# Patient Record
Sex: Female | Born: 1960
Health system: Southern US, Community
[De-identification: ages and names within clinical notes are randomized; demographics above are authoritative.]

## PROBLEM LIST (undated history)

## (undated) DIAGNOSIS — M1990 Unspecified osteoarthritis, unspecified site: Secondary | ICD-10-CM

## (undated) DIAGNOSIS — T50905A Adverse effect of unspecified drugs, medicaments and biological substances, initial encounter: Secondary | ICD-10-CM

## (undated) DIAGNOSIS — R748 Abnormal levels of other serum enzymes: Secondary | ICD-10-CM

## (undated) DIAGNOSIS — F32A Depression, unspecified: Secondary | ICD-10-CM

## (undated) DIAGNOSIS — R21 Rash and other nonspecific skin eruption: Secondary | ICD-10-CM

## (undated) DIAGNOSIS — R634 Abnormal weight loss: Secondary | ICD-10-CM

## (undated) DIAGNOSIS — T7840XA Allergy, unspecified, initial encounter: Secondary | ICD-10-CM

## (undated) DIAGNOSIS — F329 Major depressive disorder, single episode, unspecified: Secondary | ICD-10-CM

## (undated) DIAGNOSIS — R079 Chest pain, unspecified: Secondary | ICD-10-CM

## (undated) DIAGNOSIS — G8929 Other chronic pain: Secondary | ICD-10-CM

## (undated) DIAGNOSIS — E7800 Pure hypercholesterolemia, unspecified: Secondary | ICD-10-CM

## (undated) DIAGNOSIS — M797 Fibromyalgia: Secondary | ICD-10-CM

## (undated) DIAGNOSIS — F419 Anxiety disorder, unspecified: Secondary | ICD-10-CM

## (undated) HISTORY — DX: Chest pain, unspecified: R07.9

## (undated) HISTORY — DX: Depression, unspecified: F32.A

## (undated) HISTORY — DX: Rash and other nonspecific skin eruption: R21

## (undated) HISTORY — PX: CHOLECYSTECTOMY: SHX55

## (undated) HISTORY — PX: NASAL SINUS SURGERY: SHX719

## (undated) HISTORY — DX: Abnormal weight loss: R63.4

## (undated) HISTORY — PX: TONSILLECTOMY: SUR1361

## (undated) HISTORY — DX: Adverse effect of unspecified drugs, medicaments and biological substances, initial encounter: T50.905A

## (undated) HISTORY — PX: COLONOSCOPY: SHX174

## (undated) HISTORY — DX: Fibromyalgia: M79.7

## (undated) HISTORY — DX: Allergy, unspecified, initial encounter: T78.40XA

## (undated) HISTORY — DX: Major depressive disorder, single episode, unspecified: F32.9

## (undated) HISTORY — DX: Abnormal levels of other serum enzymes: R74.8

## (undated) HISTORY — PX: TUBAL LIGATION: SHX77

## (undated) HISTORY — PX: ANKLE SURGERY: SHX546

---

## 1995-11-07 HISTORY — PX: BREAST BIOPSY: SHX20

## 2010-09-24 ENCOUNTER — Emergency Department: Payer: Self-pay | Admitting: Emergency Medicine

## 2012-12-31 ENCOUNTER — Ambulatory Visit: Payer: Self-pay | Admitting: Family Medicine

## 2013-09-05 ENCOUNTER — Ambulatory Visit: Payer: Self-pay | Admitting: Physical Medicine and Rehabilitation

## 2014-10-20 ENCOUNTER — Ambulatory Visit: Payer: Self-pay | Admitting: Nurse Practitioner

## 2015-07-11 DIAGNOSIS — F32A Depression, unspecified: Secondary | ICD-10-CM | POA: Insufficient documentation

## 2015-07-11 DIAGNOSIS — E782 Mixed hyperlipidemia: Secondary | ICD-10-CM | POA: Insufficient documentation

## 2015-07-11 DIAGNOSIS — F329 Major depressive disorder, single episode, unspecified: Secondary | ICD-10-CM | POA: Insufficient documentation

## 2015-07-11 DIAGNOSIS — F419 Anxiety disorder, unspecified: Secondary | ICD-10-CM | POA: Insufficient documentation

## 2015-07-11 DIAGNOSIS — G894 Chronic pain syndrome: Secondary | ICD-10-CM | POA: Insufficient documentation

## 2015-12-20 ENCOUNTER — Emergency Department
Admission: EM | Admit: 2015-12-20 | Discharge: 2015-12-20 | Disposition: A | Discharge status: Home / Self Care | Payer: BLUE CROSS/BLUE SHIELD | Attending: Emergency Medicine | Admitting: Emergency Medicine

## 2015-12-20 ENCOUNTER — Emergency Department: Payer: BLUE CROSS/BLUE SHIELD

## 2015-12-20 ENCOUNTER — Encounter: Payer: Self-pay | Admitting: Emergency Medicine

## 2015-12-20 DIAGNOSIS — Z87891 Personal history of nicotine dependence: Secondary | ICD-10-CM | POA: Diagnosis not present

## 2015-12-20 DIAGNOSIS — R1084 Generalized abdominal pain: Secondary | ICD-10-CM | POA: Insufficient documentation

## 2015-12-20 HISTORY — DX: Pure hypercholesterolemia, unspecified: E78.00

## 2015-12-20 LAB — BASIC METABOLIC PANEL
Anion gap: 5 (ref 5–15)
BUN: 16 mg/dL (ref 6–20)
CHLORIDE: 111 mmol/L (ref 101–111)
CO2: 25 mmol/L (ref 22–32)
Calcium: 9.1 mg/dL (ref 8.9–10.3)
Creatinine, Ser: 0.77 mg/dL (ref 0.44–1.00)
GFR calc non Af Amer: >60 mL/min (ref >60)
Glucose, Bld: 99 mg/dL (ref 65–99)
POTASSIUM: 4.2 mmol/L (ref 3.5–5.1)
SODIUM: 141 mmol/L (ref 135–145)

## 2015-12-20 LAB — URINALYSIS COMPLETE WITH MICROSCOPIC (ARMC ONLY)
Bilirubin Urine: NEGATIVE
Glucose, UA: NEGATIVE mg/dL
HGB URINE DIPSTICK: NEGATIVE
KETONES UR: NEGATIVE mg/dL
LEUKOCYTES UA: NEGATIVE
NITRITE: NEGATIVE
PH: 5 (ref 5.0–8.0)
PROTEIN: NEGATIVE mg/dL
Specific Gravity, Urine: 1.035 — ABNORMAL HIGH (ref 1.005–1.030)

## 2015-12-20 LAB — CBC
HEMATOCRIT: 40.9 % (ref 35.0–47.0)
Hemoglobin: 14 g/dL (ref 12.0–16.0)
MCH: 30.1 pg (ref 26.0–34.0)
MCHC: 34.4 g/dL (ref 32.0–36.0)
MCV: 87.6 fL (ref 80.0–100.0)
Platelets: 249 10*3/uL (ref 150–440)
RBC: 4.67 MIL/uL (ref 3.80–5.20)
RDW: 13.4 % (ref 11.5–14.5)
WBC: 6.1 10*3/uL (ref 3.6–11.0)

## 2015-12-20 LAB — TROPONIN I: Troponin I: <0.03 ng/mL (ref <0.031)

## 2015-12-20 LAB — LIPASE, BLOOD: Lipase: 24 U/L (ref 11–51)

## 2015-12-20 MED ORDER — DICYCLOMINE HCL 20 MG PO TABS: 20.0000 mg | ORAL_TABLET | Freq: Three times a day (TID) | ORAL | Status: DC | PRN

## 2015-12-20 MED ORDER — ONDANSETRON HCL 4 MG/2ML IJ SOLN
4.0000 mg | Freq: Once | INTRAMUSCULAR | Status: AC
  Administered 2015-12-20: 4 mg via INTRAVENOUS
  Filled 2015-12-20: qty 2

## 2015-12-20 MED ORDER — METOCLOPRAMIDE HCL 10 MG PO TABS: 10.0000 mg | ORAL_TABLET | Freq: Four times a day (QID) | ORAL | Status: DC | PRN

## 2015-12-20 MED ORDER — SODIUM CHLORIDE 0.9 % IV BOLUS (SEPSIS)
1000.0000 mL | Freq: Once | INTRAVENOUS | Status: AC
  Administered 2015-12-20: 1000 mL via INTRAVENOUS

## 2015-12-20 MED ORDER — IOHEXOL 240 MG/ML SOLN
25.0000 mL | Freq: Once | INTRAMUSCULAR | Status: AC | PRN
  Administered 2015-12-20: 25 mL via ORAL
  Filled 2015-12-20: qty 25

## 2015-12-20 MED ORDER — IOHEXOL 300 MG/ML  SOLN
100.0000 mL | Freq: Once | INTRAMUSCULAR | Status: AC | PRN
  Administered 2015-12-20: 100 mL via INTRAVENOUS
  Filled 2015-12-20: qty 100

## 2015-12-20 MED ORDER — MORPHINE SULFATE (PF) 4 MG/ML IV SOLN
4.0000 mg | Freq: Once | INTRAVENOUS | Status: AC
  Administered 2015-12-20: 4 mg via INTRAVENOUS
  Filled 2015-12-20: qty 1

## 2015-12-20 NOTE — ED Notes (Signed)
Patient presents to the ED with sharp intermittent left sided chest pain since a week ago Saturday.  Patient also reports bloating and abdominal pain that began around the same time.  Patient states, "I feel like I did before I had to get my galbladder out".  Patient was told she had a UTI when she was seen at Urgent Care last Tuesday.  Patient was instructed to come to the ED that day but patient reports having other responsibilities to take care of prior to coming to the ED.  Patient ambulatory to triage.  Denies nausea, vomiting, and diarrhea.  Patient is in no obvious distress at this time.

## 2015-12-20 NOTE — ED Notes (Signed)
AAOx3.  Skin warm and dry.  Posture relaxed.  Gait steady.  NAD.

## 2015-12-20 NOTE — ED Provider Notes (Signed)
[facility] Emergency Department Provider Note  ____________________________________________  Time seen: Approximately 120pM  I have reviewed the triage vital signs and the nursing notes.   HISTORY  Chief Complaint Abdominal Pain and Chest Pain    HPI Nicole Walter is a 55 y.o. female with a history of high cholesterol who is presenting today with diffuse abdominal pain radiating into her left chest. She says the pain is been ongoing for about a week and a half. She was seen in urgent care about a week and a half ago and was diagnosed with abdominal pain with a urinary tract infection. She's been taking Cipro and no longer has any urinary symptoms. She says that she is also been taking MiraLAX because she was having thin, ribbonlike stool. She denies any blood in her stool. She denies any nausea or vomiting. She says the pain is aching and to her left epigastrium right and right upper and right lower quadrants.She says that she also has abdominal distention after eating. Says that since taking the MiraLAX she has had liquid like stools. Requesting pain medication at this time. Denies any worsening with eating. No shortness of breath. Denies worsening with exertion. Abdominal pain has been radiating to the back.   Past Medical History  Diagnosis Date  . High cholesterol     There are no active problems to display for this patient.   Past Surgical History  Procedure Laterality Date  . Cholecystectomy    . Tubal ligation    . Ankle surgery      x 2  . Nasal sinus surgery      No current outpatient prescriptions on file.  Allergies Review of patient's allergies indicates no known allergies.  No family history on file.  Social History Social History  Substance Use Topics  . Smoking status: Former Smoker    Quit date: 07/08/2015  . Smokeless tobacco: None  . Alcohol Use: No    Review of Systems Constitutional: No fever/chills Eyes: No visual  changes. ENT: No sore throat. Cardiovascular: Abdominal pain radiates up in the left side of the chest Respiratory: Denies shortness of breath. Gastrointestinal:   No nausea, no vomiting.  Genitourinary: Negative for dysuria. Musculoskeletal: As above Skin: Negative for rash. Neurological: Negative for headaches, focal weakness or numbness.  10-point ROS otherwise negative.  ____________________________________________   PHYSICAL EXAM:  VITAL SIGNS: ED Triage Vitals  Enc Vitals Group     BP 12/20/15 0910 130/68 mmHg     Pulse Rate 12/20/15 0910 79     Resp 12/20/15 0910 16     Temp 12/20/15 0910 97.7 F (36.5 C)     Temp Source 12/20/15 0910 Oral     SpO2 12/20/15 0910 97 %     Weight 12/20/15 0910 165 lb (74.844 kg)     Height 12/20/15 0910 5\' 4"  (1.626 m)     Head Cir --      Peak Flow --      Pain Score 12/20/15 0911 5     Pain Loc --      Pain Edu? --      Excl. in [place]? --     Constitutional: Alert and oriented. Well appearing and in no acute distress. Eyes: Conjunctivae are normal. PERRL. EOMI. Head: Atraumatic. Nose: No congestion/rhinnorhea. Mouth/Throat: Mucous membranes are moist.  Oropharynx non-erythematous. Neck: No stridor.   Cardiovascular: Normal rate, regular rhythm. Grossly normal heart sounds.  Good peripheral circulation. Respiratory: Normal respiratory effort.  No  retractions. Lungs CTAB. Gastrointestinal: Soft with diffuse tenderness which is worse in the right lower quadrant. No rebound or guarding. No distention. No CVA tenderness. Musculoskeletal: No lower extremity tenderness nor edema.  No joint effusions. Neurologic:  Normal speech and language. No gross focal neurologic deficits are appreciated. No gait instability. Skin:  Skin is warm, dry and intact. No rash noted. Psychiatric: Mood and affect are normal. Speech and behavior are normal.  ____________________________________________   LABS (all labs ordered are listed, but only  abnormal results are displayed)  Labs Reviewed  URINALYSIS COMPLETEWITH MICROSCOPIC ([place]) - Abnormal; Notable for the following:    Color, Urine YELLOW (*)    APPearance HAZY (*)    Specific Gravity, Urine 1.035 (*)    Bacteria, UA RARE (*)    Squamous Epithelial / LPF 0-5 (*)    All other components within normal limits  BASIC METABOLIC PANEL  CBC  TROPONIN I  LIPASE, BLOOD   ____________________________________________  EKG  ED ECG REPORT I, Doran Stabler, the attending physician, personally viewed and interpreted this ECG.   Date: 12/20/2015  EKG Time: 906  Rate: 75  Rhythm: normal sinus rhythm  Axis: Normal  Intervals:none  ST&T Change: No ST segment elevation or depression. No abnormal T-wave inversion.  ____________________________________________  RADIOLOGY  Chest x-ray without any acute cardio pulmonary process.  IMPRESSION: No acute finding abdomen or pelvis. No abnormality to explain the patient's symptoms.  Fatty infiltration of the liver. ____________________________________________   PROCEDURES    ____________________________________________   INITIAL IMPRESSION / ASSESSMENT AND PLAN / ED COURSE  Pertinent labs & imaging results that were available during my care of the patient were reviewed by me and considered in my medical decision making (see chart for details).  ----------------------------------------- 2:50 PM on 12/20/2015 -----------------------------------------  Patient resting comfortably and tolerated the by mouth contrast. Very reassuring CAT scan for ruling out acute cause of her pain. I reviewed the results with her. Unclear etiology for her symptoms. Will start her with Reglan as a promotility agent. I told her to use it when she is feeling bloated. He has scheduled an appointment with her primary care doctor on March 10. We'll also give her the phone number to follow-up with gastroenterology. Understands return  precautions. ____________________________________________   FINAL CLINICAL IMPRESSION(S) / ED DIAGNOSES  Abdominal pain.    Orbie Pyo, MD 12/20/15 1452

## 2016-01-06 ENCOUNTER — Ambulatory Visit (INDEPENDENT_AMBULATORY_CARE_PROVIDER_SITE_OTHER): Payer: BLUE CROSS/BLUE SHIELD | Admitting: Nurse Practitioner

## 2016-01-06 ENCOUNTER — Encounter: Payer: Self-pay | Admitting: Nurse Practitioner

## 2016-01-06 VITALS — BP 108/62 | HR 76 | Temp 98.1°F | Resp 16 | Ht 64.0 in | Wt 167.8 lb

## 2016-01-06 DIAGNOSIS — G894 Chronic pain syndrome: Secondary | ICD-10-CM

## 2016-01-06 DIAGNOSIS — R1084 Generalized abdominal pain: Secondary | ICD-10-CM

## 2016-01-06 DIAGNOSIS — Z7689 Persons encountering health services in other specified circumstances: Secondary | ICD-10-CM

## 2016-01-06 DIAGNOSIS — M47896 Other spondylosis, lumbar region: Secondary | ICD-10-CM

## 2016-01-06 DIAGNOSIS — Z7189 Other specified counseling: Secondary | ICD-10-CM

## 2016-01-06 DIAGNOSIS — F32A Depression, unspecified: Secondary | ICD-10-CM

## 2016-01-06 DIAGNOSIS — E782 Mixed hyperlipidemia: Secondary | ICD-10-CM | POA: Diagnosis not present

## 2016-01-06 DIAGNOSIS — F419 Anxiety disorder, unspecified: Secondary | ICD-10-CM

## 2016-01-06 DIAGNOSIS — F418 Other specified anxiety disorders: Secondary | ICD-10-CM

## 2016-01-06 DIAGNOSIS — R10817 Generalized abdominal tenderness: Secondary | ICD-10-CM

## 2016-01-06 DIAGNOSIS — M47816 Spondylosis without myelopathy or radiculopathy, lumbar region: Secondary | ICD-10-CM | POA: Insufficient documentation

## 2016-01-06 DIAGNOSIS — F329 Major depressive disorder, single episode, unspecified: Secondary | ICD-10-CM

## 2016-01-06 NOTE — Patient Instructions (Signed)
We will call you with your referral.   Welcome to [facility]! Nice to meet you.

## 2016-01-06 NOTE — Progress Notes (Signed)
Patient ID: Nicole Walter, female    DOB: 1961-05-19  Age: 55 y.o. MRN: [id number]  CC: Establish Care and Diverticulosis   HPI Nicole Walter presents for establishing care and CC of needing a referral   1) New Pt Info:   Immunizations- UTD  Mammogram- 10/2015 [facility]   Pap- 10/2015 [facility]   Colonoscopy- 2013, reportedly good for 10 years, had in [place]   2) Chronic Problems-  Chronic pain- was seen at [facility] in [place], [place] and a Doctor for pain, but has since stopped going.    Recent Annual exam on 11/02/16 with Dr. Bobette Mo   PHQ-9 = 16  Health Maintenance COPIED FROM CARE EVERYWHERE Topic Date Due  . Mammogram 10/12/2016  . Pap Smear 10/13/2016  . Colonoscopy 11/06/2021  . Adult Tetanus (Td And Tdap) 11/06/2021  . Influenza Vaccine Completed  . HIV Screen Completed  . Hepatitis C Screen Completed    Diet recall last 24 hrs   Chex for breakfast  Lunchable for lunch  Captain D's- hush puppies , fried fish and slaw, tea, water and cokes   Black tea and chai this morning   3) Acute Problems-  pna 23 09/16/13 [place]  Flu 07/23/15  Shingles 10/22/12   Pt was seen in the ED on 12/20/15 for generalized abdominal pain. EKG was normal. CXR shows no significant findings. CT abdomen pelvis-  fatty liver without significant findings. Gave her Reglan and a phone number to follow up with GI outpatient.    History Nicole Walter has a past medical history of High cholesterol and Depression.   She has past surgical history that includes Cholecystectomy; Tubal ligation; Ankle surgery; and Nasal sinus surgery.   Her family history includes Arthritis in her father, paternal grandfather, and paternal grandmother; Cancer in her father; Heart disease in her mother; Stroke in her mother.She reports that she quit smoking about 6 months ago. She does not have any smokeless tobacco history on file. She reports that she does not drink alcohol. Her drug history  is not on file.  Outpatient Prescriptions Prior to Visit  Medication Sig Dispense Refill  . metoCLOPramide (REGLAN) 10 MG tablet Take 1 tablet (10 mg total) by mouth every 6 (six) hours as needed (bloating, nausea or vomiting). 12 tablet 0  . dicyclomine (BENTYL) 20 MG tablet Take 1 tablet (20 mg total) by mouth 3 (three) times daily as needed for spasms. 20 tablet 0   No facility-administered medications prior to visit.    ROS Review of Systems  Constitutional: Negative for fever, chills, diaphoresis, activity change, appetite change, fatigue and unexpected weight change.  Eyes: Negative for visual disturbance.  Respiratory: Negative for chest tightness and shortness of breath.   Cardiovascular: Negative for chest pain.  Gastrointestinal: Positive for abdominal pain. Negative for nausea, vomiting and diarrhea.  Neurological: Positive for headaches.  Psychiatric/Behavioral: Positive for sleep disturbance. Negative for suicidal ideas. The patient is nervous/anxious.     Objective:  BP 108/62 mmHg  Pulse 76  Temp(Src) 98.1 F (36.7 C) (Oral)  Resp 16  Ht 5\' 4"  (1.626 m)  Wt 167 lb 12.8 oz (76.114 kg)  BMI 28.79 kg/m2  SpO2 96%  Physical Exam  Constitutional: She is oriented to person, place, and time. She appears well-developed and well-nourished. No distress.  HENT:  Head: Normocephalic and atraumatic.  Right Ear: External ear normal.  Left Ear: External ear normal.  Eyes: EOM are normal. Pupils are equal, round, and  reactive to light. Right eye exhibits no discharge. Left eye exhibits no discharge. No scleral icterus.  Cardiovascular: Normal rate, regular rhythm and normal heart sounds.  Exam reveals no gallop and no friction rub.   No murmur heard. Pulmonary/Chest: Effort normal and breath sounds normal. No respiratory distress. She has no wheezes. She has no rales. She exhibits no tenderness.  Abdominal: Soft. Bowel sounds are normal. She exhibits no distension and no  mass. There is tenderness. There is no rebound and no guarding.  Generalized  Neurological: She is alert and oriented to person, place, and time. No cranial nerve deficit. She exhibits normal muscle tone. Coordination normal.  Skin: Skin is warm and dry. No rash noted. She is not diaphoretic.  Psychiatric: Judgment and thought content normal.  Very scattered. Came late to appointment, has many papers shuffled during visit, seems anxious   Assessment & Plan:   Nicole Walter was seen today for establish care and diverticulosis.  Diagnoses and all orders for this visit:  Generalized abdominal pain -     Ambulatory referral to Gastroenterology  Combined fat and carbohydrate induced hyperlipemia  Chronic pain associated with significant psychosocial dysfunction  Anxiety and depression  Other osteoarthritis of spine, lumbar region  Encounter to establish care   I am having Nicole Walter maintain her metoCLOPramide, MULTI-VITAMINS, LORazepam, gabapentin, dicyclomine, cyclobenzaprine, Vitamin D (Ergocalciferol), Vitamin B 12, diphenhydrAMINE, DULoxetine, fluticasone, and simvastatin.  Meds ordered this encounter  Medications  . Multiple Vitamin (MULTI-VITAMINS) TABS    Sig: Take by mouth.  Marland Kitchen LORazepam (ATIVAN) 1 MG tablet    Sig: Take by mouth.  . gabapentin (NEURONTIN) 600 MG tablet    Sig: Take by mouth.  . dicyclomine (BENTYL) 20 MG tablet    Sig: Take by mouth.  . cyclobenzaprine (FLEXERIL) 10 MG tablet    Sig: Take by mouth.  . Vitamin D, Ergocalciferol, (DRISDOL) 50000 units CAPS capsule    Sig: Take by mouth.  . Cyanocobalamin (VITAMIN B 12) 100 MCG LOZG    Sig: Take by mouth.  . diphenhydrAMINE (BENADRYL) 25 mg capsule    Sig: Take by mouth.  . DULoxetine (CYMBALTA) 20 MG capsule    Sig: Take by mouth.  . fluticasone (FLONASE) 50 MCG/ACT nasal spray    Sig: Place into the nose.  . simvastatin (ZOCOR) 10 MG tablet    Sig: Take by mouth.     Follow-up: Return in about 4  weeks (around 02/03/2016) for Follow up.

## 2016-01-06 NOTE — Assessment & Plan Note (Addendum)
Pt was seen at an UC recently for UTI then ED on 12/20/15 for abdominal pain. CT was normal except for fatty liver. Referral to GI placed.

## 2016-01-14 ENCOUNTER — Ambulatory Visit: Payer: BLUE CROSS/BLUE SHIELD | Admitting: Nurse Practitioner

## 2016-01-14 DIAGNOSIS — Z1272 Encounter for screening for malignant neoplasm of vagina: Secondary | ICD-10-CM | POA: Insufficient documentation

## 2016-01-14 DIAGNOSIS — R10817 Generalized abdominal tenderness: Secondary | ICD-10-CM | POA: Insufficient documentation

## 2016-01-14 DIAGNOSIS — Z0000 Encounter for general adult medical examination without abnormal findings: Secondary | ICD-10-CM | POA: Insufficient documentation

## 2016-01-14 NOTE — Assessment & Plan Note (Signed)
Pt has depression and chronic pain. Pt taking Neurontin and Cymbalta with some relief. Pt has stooped seeing [facility]

## 2016-01-14 NOTE — Assessment & Plan Note (Signed)
Pt is currently stable on Lorazepam 1 mg  Will discuss weaning at a future visit

## 2016-01-14 NOTE — Assessment & Plan Note (Signed)
Pt was being seen by [facility]- Will try to obtain records

## 2016-01-14 NOTE — Assessment & Plan Note (Signed)
On zocor currently. Will obtain labs  Will follow

## 2016-01-14 NOTE — Assessment & Plan Note (Signed)
Discussed acute and chronic issues. Reviewed health maintenance measures, PFSHx, and immunizations. Care Everywhere has many records available.

## 2016-01-27 ENCOUNTER — Encounter: Payer: Self-pay | Admitting: Nurse Practitioner

## 2016-01-27 ENCOUNTER — Ambulatory Visit (INDEPENDENT_AMBULATORY_CARE_PROVIDER_SITE_OTHER): Payer: BLUE CROSS/BLUE SHIELD | Admitting: Nurse Practitioner

## 2016-01-27 VITALS — BP 104/66 | HR 69 | Temp 98.3°F | Resp 14 | Ht 64.0 in | Wt 166.6 lb

## 2016-01-27 DIAGNOSIS — R1084 Generalized abdominal pain: Secondary | ICD-10-CM

## 2016-01-27 DIAGNOSIS — M546 Pain in thoracic spine: Secondary | ICD-10-CM

## 2016-01-27 DIAGNOSIS — F419 Anxiety disorder, unspecified: Secondary | ICD-10-CM

## 2016-01-27 DIAGNOSIS — M542 Cervicalgia: Secondary | ICD-10-CM

## 2016-01-27 DIAGNOSIS — F329 Major depressive disorder, single episode, unspecified: Secondary | ICD-10-CM

## 2016-01-27 DIAGNOSIS — R21 Rash and other nonspecific skin eruption: Secondary | ICD-10-CM

## 2016-01-27 DIAGNOSIS — M47896 Other spondylosis, lumbar region: Secondary | ICD-10-CM

## 2016-01-27 DIAGNOSIS — R41840 Attention and concentration deficit: Secondary | ICD-10-CM

## 2016-01-27 DIAGNOSIS — F32A Depression, unspecified: Secondary | ICD-10-CM

## 2016-01-27 DIAGNOSIS — F418 Other specified anxiety disorders: Secondary | ICD-10-CM

## 2016-01-27 MED ORDER — DULOXETINE HCL 60 MG PO CPEP: 60.0000 mg | ORAL_CAPSULE | Freq: Every day | ORAL | Status: DC

## 2016-01-27 MED ORDER — LORAZEPAM 1 MG PO TABS: 1.0000 mg | ORAL_TABLET | Freq: Two times a day (BID) | ORAL | Status: DC

## 2016-01-27 MED ORDER — PREDNISONE 20 MG PO TABS: ORAL_TABLET | ORAL | Status: DC

## 2016-01-27 MED ORDER — METHYLPREDNISOLONE ACETATE 80 MG/ML IJ SUSP
80.0000 mg | Freq: Once | INTRAMUSCULAR | Status: AC
  Administered 2016-01-27: 80 mg via INTRAMUSCULAR

## 2016-01-27 NOTE — Patient Instructions (Signed)
Try the Cymbalta 60 mg and see if this makes a difference. We will get you in with Dr. Lurline Hare or a colleague for testing for ADHD/depression ect...   The prednisone will help over the next 1-2 days.   See you in 2-3 weeks.

## 2016-01-27 NOTE — Progress Notes (Signed)
Patient ID: Nicole Walter, female    DOB: 08-11-1961  Age: 55 y.o. MRN: [id number]  CC: Follow-up; Rash; and Hip Pain   HPI Amymarie Gal presents for follow up of chronic conditions.   1) Chronic pain- gabapentin not helpful  Pain locations: Back, hip, thoracic area, arms, neck, shoulders  Meloxicam, celebrex, topamax  Flexeril- somewhat helpful  Cymbalta- 20 mg cap twice daily- not helpful  Gabapentin- tried up to 1800 mg, but no response greater than at 900 mg. Feels good at 900 mg and helps with thoracic radiating pain   Trigger point injections with Dr. [facility]- saw them  Patient is interested in seeing Rheumatology to discuss possible fibromyalgia vs. Autoimmune conditions.  2) Rash on back- started over 1 month ago, sister staying with her and brought a couch with bed bugs. Took a bug to [facility] for ID  Steroid ointment on it for 3 weeks  And calamine lotion   3) Abdominal pain- recently saw Dr. Marton Redwood NP for abdominal pain, bloating, epigastric pressure, bowel caliber change. Diagnosis- suspected GERD/IBS Treatment- Protonix 40 mg, bland diet, small frequent meals, bentyl prn, EGD and colonoscopy for eval.   Scheduled for April 18th.   4) Depression/Anxiety- Pt feels her depression is slightly worsening. She is ready to return to work and is restless (unsure of what is holding her back currently?). Anxiety stable. Has trouble completing tasks, but starts many at the same time. Never tested for ADHD or psychological concerns.    History Sametria has a past medical history of High cholesterol and Depression.   She has past surgical history that includes Cholecystectomy; Tubal ligation; Ankle surgery; and Nasal sinus surgery.   Her family history includes Arthritis in her father, paternal grandfather, and paternal grandmother; Cancer in her father; Heart disease in her mother; Stroke in her mother.She reports that she quit smoking about 6 months ago. She  does not have any smokeless tobacco history on file. She reports that she does not drink alcohol. Her drug history is not on file.  Outpatient Prescriptions Prior to Visit  Medication Sig Dispense Refill  . Cyanocobalamin (VITAMIN B 12) 100 MCG LOZG Take by mouth.    . cyclobenzaprine (FLEXERIL) 10 MG tablet Take by mouth.    . dicyclomine (BENTYL) 20 MG tablet Take by mouth.    . diphenhydrAMINE (BENADRYL) 25 mg capsule Take by mouth.    . fluticasone (FLONASE) 50 MCG/ACT nasal spray Place into the nose.    . gabapentin (NEURONTIN) 600 MG tablet Take by mouth.    . Multiple Vitamin (MULTI-VITAMINS) TABS Take by mouth.    . simvastatin (ZOCOR) 10 MG tablet Take by mouth.    . Vitamin D, Ergocalciferol, (DRISDOL) 50000 units CAPS capsule Take by mouth.    . DULoxetine (CYMBALTA) 20 MG capsule Take by mouth.    Marland Kitchen LORazepam (ATIVAN) 1 MG tablet Take by mouth.    . metoCLOPramide (REGLAN) 10 MG tablet Take 1 tablet (10 mg total) by mouth every 6 (six) hours as needed (bloating, nausea or vomiting). (Patient not taking: Reported on 01/27/2016) 12 tablet 0   No facility-administered medications prior to visit.    ROS Review of Systems  Constitutional: Negative for fever, chills, diaphoresis and fatigue.  Respiratory: Negative for chest tightness, shortness of breath and wheezing.   Cardiovascular: Negative for chest pain, palpitations and leg swelling.  Gastrointestinal: Negative for nausea, vomiting and diarrhea.  Musculoskeletal: Positive for myalgias, back pain, arthralgias and neck pain. Negative  for joint swelling, gait problem and neck stiffness.  Skin: Positive for rash.  Neurological: Negative for dizziness, weakness, numbness and headaches.  Psychiatric/Behavioral: Positive for decreased concentration. Negative for suicidal ideas and sleep disturbance. The patient is nervous/anxious.     Objective:  BP 104/66 mmHg  Pulse 69  Temp(Src) 98.3 F (36.8 C) (Oral)  Resp 14  Ht 5\' 4"   (1.626 m)  Wt 166 lb 9.6 oz (75.569 kg)  BMI 28.58 kg/m2  SpO2 97%  Physical Exam  Constitutional: She is oriented to person, place, and time. She appears well-developed and well-nourished. No distress.  HENT:  Head: Normocephalic and atraumatic.  Right Ear: External ear normal.  Left Ear: External ear normal.  Cardiovascular: Normal rate, regular rhythm, normal heart sounds and intact distal pulses.  Exam reveals no gallop and no friction rub.   No murmur heard. Pulmonary/Chest: Effort normal and breath sounds normal. No respiratory distress. She has no wheezes. She has no rales. She exhibits no tenderness.  Musculoskeletal: Normal range of motion. She exhibits tenderness. She exhibits no edema.  Low back paraspinal, thoracic, and trapezius tenderness to palpation with tight muscles  Neurological: She is alert and oriented to person, place, and time. No cranial nerve deficit. She exhibits normal muscle tone. Coordination normal.  Skin: Skin is warm and dry. Rash noted. She is not diaphoretic.     Several papules with erythematous bases in a cluster above her buttock  Psychiatric: She has a normal mood and affect. Her behavior is normal. Judgment and thought content normal.  Inattentiveness somewhat improved at this visit   Assessment & Plan:   Homer was seen today for follow-up, rash and hip pain.  Diagnoses and all orders for this visit:  Rash of back -     methylPREDNISolone acetate (DEPO-MEDROL) injection 80 mg; Inject 1 mL (80 mg total) into the muscle once.  Other osteoarthritis of spine, lumbar region -     Ambulatory referral to Rheumatology  Right-sided thoracic back pain -     Ambulatory referral to Rheumatology  Cervical pain -     Ambulatory referral to Rheumatology  Anxiety and depression -     Ambulatory referral to Psychiatry  Concentration deficit -     Ambulatory referral to Psychiatry  Generalized abdominal pain  Other orders -     DULoxetine  (CYMBALTA) 60 MG capsule; Take 1 capsule (60 mg total) by mouth daily. -     LORazepam (ATIVAN) 1 MG tablet; Take 1 tablet (1 mg total) by mouth 2 (two) times daily. -     predniSONE (DELTASONE) 20 MG tablet; Take 2 tablets by mouth on days 1, 2, & 3 then 1 tablet by mouth on days 3, 4, & 5.   I have discontinued Ms. Chirino's metoCLOPramide and DULoxetine. I have also changed her LORazepam. Additionally, I am having her start on DULoxetine and predniSONE. Lastly, I am having her maintain her MULTI-VITAMINS, gabapentin, dicyclomine, cyclobenzaprine, Vitamin D (Ergocalciferol), Vitamin B 12, diphenhydrAMINE, fluticasone, simvastatin, pantoprazole, and polyethylene glycol powder. We administered methylPREDNISolone acetate.  Meds ordered this encounter  Medications  . pantoprazole (PROTONIX) 40 MG tablet    Sig: Take by mouth.  . polyethylene glycol powder (GLYCOLAX/MIRALAX) powder    Sig:   . DISCONTD: polyethylene glycol powder (GLYCOLAX/MIRALAX) powder    Sig: take as directed for COLONIC PREP    Refill:  0  . DULoxetine (CYMBALTA) 60 MG capsule    Sig: Take 1 capsule (60 mg total)  by mouth daily.    Dispense:  30 capsule    Refill:  1    Order Specific Question:  Supervising Provider    Answer:  Deborra Medina L [[id number]]  . LORazepam (ATIVAN) 1 MG tablet    Sig: Take 1 tablet (1 mg total) by mouth 2 (two) times daily.    Dispense:  60 tablet    Refill:  1    Fill on or after April 11th, 2017    Order Specific Question:  Supervising Provider    Answer:  Deborra Medina L [[id number]]  . methylPREDNISolone acetate (DEPO-MEDROL) injection 80 mg    Sig:   . predniSONE (DELTASONE) 20 MG tablet    Sig: Take 2 tablets by mouth on days 1, 2, & 3 then 1 tablet by mouth on days 3, 4, & 5.    Dispense:  9 tablet    Refill:  0    Order Specific Question:  Supervising Provider    Answer:  Crecencio Mc [[id number]]     Follow-up: Return in about 2 weeks (around 02/10/2016) for Follow up.

## 2016-01-28 DIAGNOSIS — R21 Rash and other nonspecific skin eruption: Secondary | ICD-10-CM

## 2016-01-28 DIAGNOSIS — G8929 Other chronic pain: Secondary | ICD-10-CM | POA: Insufficient documentation

## 2016-01-28 DIAGNOSIS — M542 Cervicalgia: Secondary | ICD-10-CM

## 2016-01-28 DIAGNOSIS — R41840 Attention and concentration deficit: Secondary | ICD-10-CM | POA: Insufficient documentation

## 2016-01-28 HISTORY — DX: Rash and other nonspecific skin eruption: R21

## 2016-01-28 NOTE — Assessment & Plan Note (Addendum)
Feels her depression is worsening Will try Cymbalta 60 mg at night for pain control and help with depression Stable on Ativan for anxiety currently  Pt would like to see Psychiatry for medication help as well as testing for ADHD- pt does demonstrate inability to stay on topic (better at this visit than the last) FU prn worsening/failure to improve.

## 2016-01-28 NOTE — Assessment & Plan Note (Signed)
Has seen [facility] and Dr. Sharlet Salina  Pt taking Meloxicam, flexeril, cymbalta and gabapentin  Will try for rheumatology referral to explore rheumatic, autoimmune options.

## 2016-01-28 NOTE — Assessment & Plan Note (Signed)
New onset Depo Medrol 80 mg IM  No topicals at this time FU prn worsening/failure to improve.

## 2016-01-28 NOTE — Assessment & Plan Note (Signed)
See psych referral and Note from anxiety and depression

## 2016-01-28 NOTE — Assessment & Plan Note (Signed)
Chronic pain Imagining multiple in our records and in Care Everywhere  X-ray of cervical spine 2004 - mild degeneration

## 2016-01-28 NOTE — Assessment & Plan Note (Signed)
Primary concern for pain in past Chronic with radiation of pain to arm- helped by gabapentin  See Cervical and Lumbar notes MRI's in chart .Marland KitchenMarland Kitchen

## 2016-01-28 NOTE — Assessment & Plan Note (Signed)
Followed by [facility]  Set up for upper and lower endoscopy 02/22/16

## 2016-02-08 DIAGNOSIS — R748 Abnormal levels of other serum enzymes: Secondary | ICD-10-CM | POA: Insufficient documentation

## 2016-02-08 DIAGNOSIS — M8580 Other specified disorders of bone density and structure, unspecified site: Secondary | ICD-10-CM | POA: Insufficient documentation

## 2016-02-08 DIAGNOSIS — M47812 Spondylosis without myelopathy or radiculopathy, cervical region: Secondary | ICD-10-CM | POA: Insufficient documentation

## 2016-02-08 DIAGNOSIS — M797 Fibromyalgia: Secondary | ICD-10-CM | POA: Insufficient documentation

## 2016-02-08 HISTORY — DX: Abnormal levels of other serum enzymes: R74.8

## 2016-02-09 ENCOUNTER — Other Ambulatory Visit: Payer: Self-pay | Admitting: Internal Medicine

## 2016-02-09 DIAGNOSIS — M47812 Spondylosis without myelopathy or radiculopathy, cervical region: Secondary | ICD-10-CM

## 2016-02-09 DIAGNOSIS — M47816 Spondylosis without myelopathy or radiculopathy, lumbar region: Secondary | ICD-10-CM

## 2016-02-10 ENCOUNTER — Encounter: Payer: Self-pay | Admitting: Nurse Practitioner

## 2016-02-10 ENCOUNTER — Ambulatory Visit (INDEPENDENT_AMBULATORY_CARE_PROVIDER_SITE_OTHER): Payer: BLUE CROSS/BLUE SHIELD | Admitting: Nurse Practitioner

## 2016-02-10 VITALS — BP 122/62 | HR 70 | Temp 98.4°F | Ht 64.0 in | Wt 168.5 lb

## 2016-02-10 DIAGNOSIS — M546 Pain in thoracic spine: Secondary | ICD-10-CM | POA: Diagnosis not present

## 2016-02-10 DIAGNOSIS — M47896 Other spondylosis, lumbar region: Secondary | ICD-10-CM | POA: Diagnosis not present

## 2016-02-10 DIAGNOSIS — R21 Rash and other nonspecific skin eruption: Secondary | ICD-10-CM | POA: Diagnosis not present

## 2016-02-10 DIAGNOSIS — F331 Major depressive disorder, recurrent, moderate: Secondary | ICD-10-CM

## 2016-02-10 DIAGNOSIS — R41840 Attention and concentration deficit: Secondary | ICD-10-CM

## 2016-02-10 DIAGNOSIS — E785 Hyperlipidemia, unspecified: Secondary | ICD-10-CM | POA: Diagnosis not present

## 2016-02-10 MED ORDER — HYDROCODONE-ACETAMINOPHEN 5-325 MG PO TABS: 1.0000 | ORAL_TABLET | Freq: Four times a day (QID) | ORAL | Status: DC | PRN

## 2016-02-10 MED ORDER — LORAZEPAM 1 MG PO TABS: 1.0000 mg | ORAL_TABLET | Freq: Two times a day (BID) | ORAL | Status: DC

## 2016-02-10 NOTE — Progress Notes (Signed)
Patient ID: Nicole Walter, female    DOB: 12-30-1960  Age: 55 y.o. MRN: [id number]  CC: Follow-up and Wants back rechecked   HPI Nicole Walter presents for follow up of rash and chronic pain.   1) Rash- Depo- Medrol 80 IM last visit this was helpful. She scratched one of the scabs and then put hydrogen peroxide on it and would like me to look at it today. She is keeping a bandage on this.   2) Psychiatry and Rheumatology referrals   Went to Rheumatology yesterday- ordered lumbar and thoracic imaging  3) Cholesterol - had in Dec. and was placed on 10 mg of simvastatin she would like this checked today but is not fasting. We will check future fasting labs  4) GI- Dr. Sharen Hint him for GI concerns and having EGD at the end of this month.  5) patient was on Vicodin at her previous facility. Patient reported this was helpful, and is in current generalized pain. She reports that over-the-counter pain medications are not helpful for her, mobic, diclofenac as well. Patient had MRI of spine in 2012 with mild to moderate degeneration. She was diagnosed with fibromyalgia  History Nicole Walter has a past medical history of High cholesterol; Depression; and Fibromyalgia.   She has past surgical history that includes Cholecystectomy; Tubal ligation; Ankle surgery; and Nasal sinus surgery.   Her family history includes Arthritis in her father, paternal grandfather, and paternal grandmother; Cancer in her father; Heart disease in her mother; Stroke in her mother.She reports that she quit smoking about 7 months ago. She does not have any smokeless tobacco history on file. She reports that she does not drink alcohol or use illicit drugs.  Outpatient Prescriptions Prior to Visit  Medication Sig Dispense Refill  . Cyanocobalamin (VITAMIN B 12) 100 MCG LOZG Take by mouth.    . cyclobenzaprine (FLEXERIL) 10 MG tablet Take by mouth.    . dicyclomine (BENTYL) 20 MG tablet Take by mouth.    . diphenhydrAMINE  (BENADRYL) 25 mg capsule Take by mouth.    . DULoxetine (CYMBALTA) 60 MG capsule Take 1 capsule (60 mg total) by mouth daily. 30 capsule 1  . gabapentin (NEURONTIN) 600 MG tablet Take 900 mg by mouth.     . Multiple Vitamin (MULTI-VITAMINS) TABS Take by mouth.    . pantoprazole (PROTONIX) 40 MG tablet Take by mouth.    . polyethylene glycol powder (GLYCOLAX/MIRALAX) powder Reported on 02/14/2016    . LORazepam (ATIVAN) 1 MG tablet Take 1 tablet (1 mg total) by mouth 2 (two) times daily. 60 tablet 1  . predniSONE (DELTASONE) 20 MG tablet Take 2 tablets by mouth on days 1, 2, & 3 then 1 tablet by mouth on days 3, 4, & 5. 9 tablet 0  . simvastatin (ZOCOR) 10 MG tablet Take by mouth.    . Vitamin D, Ergocalciferol, (DRISDOL) 50000 units CAPS capsule Take by mouth.    . fluticasone (FLONASE) 50 MCG/ACT nasal spray Place into the nose.     No facility-administered medications prior to visit.    ROS Review of Systems  Constitutional: Negative for fever, chills, diaphoresis and fatigue.  Respiratory: Negative for chest tightness, shortness of breath and wheezing.   Cardiovascular: Negative for chest pain, palpitations and leg swelling.  Gastrointestinal: Negative for nausea, vomiting and diarrhea.  Musculoskeletal: Positive for myalgias, back pain and arthralgias. Negative for joint swelling, gait problem, neck pain and neck stiffness.  Skin: Positive for wound. Negative for rash.  Neurological:  Negative for dizziness, weakness, numbness and headaches.  Psychiatric/Behavioral: Positive for decreased concentration. Negative for suicidal ideas and sleep disturbance. The patient is nervous/anxious and is hyperactive.     Objective:  BP 122/62 mmHg  Pulse 70  Temp(Src) 98.4 F (36.9 C) (Oral)  Ht 5\' 4"  (1.626 m)  Wt 168 lb 8 oz (76.431 kg)  BMI 28.91 kg/m2  SpO2 97%  Physical Exam  Constitutional: She is oriented to person, place, and time. She appears well-developed and well-nourished. No  distress.  HENT:  Head: Normocephalic and atraumatic.  Right Ear: External ear normal.  Left Ear: External ear normal.  Eyes: Right eye exhibits no discharge. Left eye exhibits no discharge. No scleral icterus.  Cardiovascular: Normal rate, regular rhythm and normal heart sounds.  Exam reveals no gallop and no friction rub.   No murmur heard. Pulmonary/Chest: Effort normal and breath sounds normal. No respiratory distress. She has no wheezes. She has no rales. She exhibits no tenderness.  Neurological: She is alert and oriented to person, place, and time. She displays normal reflexes. No cranial nerve deficit. She exhibits normal muscle tone. Coordination normal.  Skin: Skin is warm and dry. No rash noted. She is not diaphoretic.     2 mm area per scab was removed, granulation tissue hidden beneath a whitish superficial layer-from hydrogen peroxide use  Psychiatric: Her behavior is normal. Judgment and thought content normal.  Patient tends to have flat affect, does make good eye contact, dressed appropriately today, shows inattentive and hyperactive movements (intermittent) and thoughts during visit.      Assessment & Plan:   Nicole Walter was seen today for follow-up and wants back rechecked.  Diagnoses and all orders for this visit:  Hyperlipidemia -     Lipid Profile; Future  Right-sided thoracic back pain  Other osteoarthritis of spine, lumbar region  Rash of back  Major depressive disorder, recurrent episode, moderate (HCC)  Concentration deficit  Other orders -     LORazepam (ATIVAN) 1 MG tablet; Take 1 tablet (1 mg total) by mouth 2 (two) times daily. -     HYDROcodone-acetaminophen (NORCO/VICODIN) 5-325 MG tablet; Take 1 tablet by mouth every 6 (six) hours as needed for moderate pain.   I have discontinued Nicole Walter Vitamin D (Ergocalciferol), fluticasone, and predniSONE. I am also having her start on HYDROcodone-acetaminophen. Additionally, I am having her maintain  her MULTI-VITAMINS, gabapentin, dicyclomine, cyclobenzaprine, Vitamin B 12, diphenhydrAMINE, pantoprazole, polyethylene glycol powder, DULoxetine, Vitamin D, and LORazepam.  Meds ordered this encounter  Medications  . Cholecalciferol (VITAMIN D) 2000 units tablet    Sig: Take 4,000 Units by mouth daily.  Marland Kitchen LORazepam (ATIVAN) 1 MG tablet    Sig: Take 1 tablet (1 mg total) by mouth 2 (two) times daily.    Dispense:  60 tablet    Refill:  2    Fill on or after April 11th, 2017    Order Specific Question:  Supervising Provider    Answer:  Deborra Medina L [[id number]]  . HYDROcodone-acetaminophen (NORCO/VICODIN) 5-325 MG tablet    Sig: Take 1 tablet by mouth every 6 (six) hours as needed for moderate pain.    Dispense:  10 tablet    Refill:  0    Order Specific Question:  Supervising Provider    Answer:  Crecencio Mc [[id number]]     Follow-up: Return in about 4 days (around 02/14/2016) for Lab appointment for cholesterol check.

## 2016-02-10 NOTE — Patient Instructions (Addendum)
Continue the Cymbalta   Make your appointment for fasting cholesterol labs for Monday (if slots available).   Stop the hydrogen peroxide, keep a bandage on it, don't mess with it, neosporin when dried out

## 2016-02-10 NOTE — Progress Notes (Signed)
Pre visit review using our clinic review tool, if applicable. No additional management support is needed unless otherwise documented below in the visit note. 

## 2016-02-14 ENCOUNTER — Encounter: Payer: Self-pay | Admitting: Licensed Clinical Social Worker

## 2016-02-14 ENCOUNTER — Ambulatory Visit (INDEPENDENT_AMBULATORY_CARE_PROVIDER_SITE_OTHER): Payer: BLUE CROSS/BLUE SHIELD | Admitting: Licensed Clinical Social Worker

## 2016-02-14 ENCOUNTER — Other Ambulatory Visit (INDEPENDENT_AMBULATORY_CARE_PROVIDER_SITE_OTHER): Payer: BLUE CROSS/BLUE SHIELD

## 2016-02-14 DIAGNOSIS — F331 Major depressive disorder, recurrent, moderate: Secondary | ICD-10-CM | POA: Diagnosis not present

## 2016-02-14 DIAGNOSIS — E785 Hyperlipidemia, unspecified: Secondary | ICD-10-CM

## 2016-02-14 LAB — LIPID PANEL
CHOL/HDL RATIO: 4
Cholesterol: 256 mg/dL — ABNORMAL HIGH (ref 0–200)
HDL: 58.1 mg/dL (ref >39.00)
LDL CALC: 166 mg/dL — AB (ref 0–99)
NONHDL: 197.96
TRIGLYCERIDES: 158 mg/dL — AB (ref 0.0–149.0)
VLDL: 31.6 mg/dL (ref 0.0–40.0)
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
## 2016-02-15 ENCOUNTER — Telehealth: Payer: Self-pay | Admitting: Nurse Practitioner

## 2016-02-17 ENCOUNTER — Other Ambulatory Visit: Payer: Self-pay | Admitting: Nurse Practitioner

## 2016-02-17 DIAGNOSIS — E785 Hyperlipidemia, unspecified: Secondary | ICD-10-CM

## 2016-02-17 MED ORDER — SIMVASTATIN 20 MG PO TABS: 20.0000 mg | ORAL_TABLET | Freq: Every day | ORAL | Status: DC

## 2016-02-18 DIAGNOSIS — E785 Hyperlipidemia, unspecified: Secondary | ICD-10-CM | POA: Insufficient documentation

## 2016-02-18 NOTE — Assessment & Plan Note (Signed)
We'll check fasting lipid profile in the future

## 2016-02-18 NOTE — Assessment & Plan Note (Signed)
Patient was referred to psychiatry

## 2016-02-18 NOTE — Assessment & Plan Note (Signed)
Same thing as thoracic back pain see note

## 2016-02-18 NOTE — Assessment & Plan Note (Signed)
Refer to psychiatry for testing

## 2016-02-18 NOTE — Assessment & Plan Note (Signed)
Rheumatology is helping her with her thoracic back pain at this time by getting imaging. Pain contract was signed the patient was given one month of Vicodin to take to pharmacy. Patient had given UDS today

## 2016-02-18 NOTE — Assessment & Plan Note (Signed)
Improved except for healing wound area from scratching and using hydrogen peroxide. Asked her to stop the hydrogen peroxide clean with soap and water, and keep covered with bandage.

## 2016-02-21 ENCOUNTER — Encounter: Payer: Self-pay | Admitting: *Deleted

## 2016-02-22 ENCOUNTER — Ambulatory Visit: Payer: BLUE CROSS/BLUE SHIELD | Admitting: *Deleted

## 2016-02-22 ENCOUNTER — Ambulatory Visit
Admission: RE | Admit: 2016-02-22 | Discharge: 2016-02-22 | Disposition: A | Discharge status: Home / Self Care | Payer: BLUE CROSS/BLUE SHIELD | Source: Ambulatory Visit | Attending: Gastroenterology | Admitting: Gastroenterology

## 2016-02-22 ENCOUNTER — Encounter
Admission: RE | Disposition: A | Discharge status: Home / Self Care | Payer: Self-pay | Source: Ambulatory Visit | Attending: Gastroenterology

## 2016-02-22 ENCOUNTER — Encounter: Payer: Self-pay | Admitting: *Deleted

## 2016-02-22 DIAGNOSIS — M479 Spondylosis, unspecified: Secondary | ICD-10-CM | POA: Diagnosis not present

## 2016-02-22 DIAGNOSIS — K52839 Microscopic colitis, unspecified: Secondary | ICD-10-CM | POA: Insufficient documentation

## 2016-02-22 DIAGNOSIS — E7800 Pure hypercholesterolemia, unspecified: Secondary | ICD-10-CM | POA: Diagnosis not present

## 2016-02-22 DIAGNOSIS — F419 Anxiety disorder, unspecified: Secondary | ICD-10-CM | POA: Insufficient documentation

## 2016-02-22 DIAGNOSIS — K2970 Gastritis, unspecified, without bleeding: Secondary | ICD-10-CM | POA: Diagnosis not present

## 2016-02-22 DIAGNOSIS — K2980 Duodenitis without bleeding: Secondary | ICD-10-CM | POA: Diagnosis not present

## 2016-02-22 DIAGNOSIS — Z7951 Long term (current) use of inhaled steroids: Secondary | ICD-10-CM | POA: Insufficient documentation

## 2016-02-22 DIAGNOSIS — R1033 Periumbilical pain: Secondary | ICD-10-CM | POA: Diagnosis not present

## 2016-02-22 DIAGNOSIS — K210 Gastro-esophageal reflux disease with esophagitis: Secondary | ICD-10-CM | POA: Diagnosis not present

## 2016-02-22 DIAGNOSIS — K635 Polyp of colon: Secondary | ICD-10-CM | POA: Diagnosis not present

## 2016-02-22 DIAGNOSIS — D123 Benign neoplasm of transverse colon: Secondary | ICD-10-CM | POA: Insufficient documentation

## 2016-02-22 DIAGNOSIS — R1084 Generalized abdominal pain: Secondary | ICD-10-CM | POA: Diagnosis present

## 2016-02-22 DIAGNOSIS — Z79899 Other long term (current) drug therapy: Secondary | ICD-10-CM | POA: Insufficient documentation

## 2016-02-22 DIAGNOSIS — K621 Rectal polyp: Secondary | ICD-10-CM | POA: Diagnosis not present

## 2016-02-22 DIAGNOSIS — K5730 Diverticulosis of large intestine without perforation or abscess without bleeding: Secondary | ICD-10-CM | POA: Diagnosis not present

## 2016-02-22 DIAGNOSIS — F329 Major depressive disorder, single episode, unspecified: Secondary | ICD-10-CM | POA: Diagnosis not present

## 2016-02-22 DIAGNOSIS — R194 Change in bowel habit: Secondary | ICD-10-CM | POA: Diagnosis present

## 2016-02-22 DIAGNOSIS — G8929 Other chronic pain: Secondary | ICD-10-CM | POA: Insufficient documentation

## 2016-02-22 DIAGNOSIS — Z91018 Allergy to other foods: Secondary | ICD-10-CM | POA: Insufficient documentation

## 2016-02-22 DIAGNOSIS — K760 Fatty (change of) liver, not elsewhere classified: Secondary | ICD-10-CM | POA: Insufficient documentation

## 2016-02-22 DIAGNOSIS — Z91048 Other nonmedicinal substance allergy status: Secondary | ICD-10-CM | POA: Diagnosis not present

## 2016-02-22 DIAGNOSIS — M797 Fibromyalgia: Secondary | ICD-10-CM | POA: Insufficient documentation

## 2016-02-22 DIAGNOSIS — Z79891 Long term (current) use of opiate analgesic: Secondary | ICD-10-CM | POA: Diagnosis not present

## 2016-02-22 DIAGNOSIS — Z86010 Personal history of colonic polyps: Secondary | ICD-10-CM | POA: Diagnosis not present

## 2016-02-22 HISTORY — PX: ESOPHAGOGASTRODUODENOSCOPY (EGD) WITH PROPOFOL: SHX5813

## 2016-02-22 HISTORY — DX: Other chronic pain: G89.29

## 2016-02-22 HISTORY — PX: COLONOSCOPY WITH PROPOFOL: SHX5780

## 2016-02-22 HISTORY — DX: Unspecified osteoarthritis, unspecified site: M19.90

## 2016-02-22 HISTORY — DX: Anxiety disorder, unspecified: F41.9

## 2016-02-22 LAB — HM COLONOSCOPY

## 2016-02-22 SURGERY — ESOPHAGOGASTRODUODENOSCOPY (EGD) WITH PROPOFOL
Anesthesia: General

## 2016-02-22 MED ORDER — FENTANYL CITRATE (PF) 100 MCG/2ML IJ SOLN
INTRAMUSCULAR | Status: DC | PRN
Start: 2016-02-22 — End: 2016-02-22
  Administered 2016-02-22 (×2): 50 ug via INTRAVENOUS

## 2016-02-22 MED ORDER — SODIUM CHLORIDE 0.9 % IV SOLN: INTRAVENOUS | Status: DC

## 2016-02-22 MED ORDER — SODIUM CHLORIDE 0.9 % IV SOLN
INTRAVENOUS | Status: DC
  Administered 2016-02-22: 1000 mL via INTRAVENOUS
  Administered 2016-02-22: 14:00:00 via INTRAVENOUS

## 2016-02-22 MED ORDER — MIDAZOLAM HCL 2 MG/2ML IJ SOLN
INTRAMUSCULAR | Status: DC | PRN
  Administered 2016-02-22: 2 mg via INTRAVENOUS

## 2016-02-22 MED ORDER — PROPOFOL 10 MG/ML IV BOLUS
INTRAVENOUS | Status: DC | PRN
  Administered 2016-02-22: 1400 mg via INTRAVENOUS

## 2016-02-22 MED ORDER — LIDOCAINE HCL (CARDIAC) 20 MG/ML IV SOLN
INTRAVENOUS | Status: DC | PRN
  Administered 2016-02-22: 50 mg via INTRAVENOUS

## 2016-02-22 NOTE — H&P (Signed)
Outpatient short stay form Pre-procedure 02/22/2016 1:41 PM Lollie Sails MD  Primary Physician: Lorane Gell NP  Reason for visit:  EGD and colonoscopy  History of present illness:  Patient is a 55 year old female presenting today for procedures as above. She has a history of area and umbilical and lower abdominal pain that began about 3 years ago. He did have a endoscopy in 2013 with polyps being removed. She's never had an endoscopy. He states that this has been episodic in nature however becoming worse and more frequent. Does have a history of fibromyalgia. She does take some Bentyl area when these episodes occur she does not throw up or have nausea and she has no black or bloody looking stools. She had a CT scan that showed a fatty infiltration of the liver but no other abnormalities. She has had her gallbladder and removed in the past.    Current facility-administered medications:  .  0.9 %  sodium chloride infusion, , Intravenous, Continuous, Lollie Sails, MD, Last Rate: 20 mL/hr at 02/22/16 1314, 1,000 mL at 02/22/16 1314 .  0.9 %  sodium chloride infusion, , Intravenous, Continuous, Lollie Sails, MD  Prescriptions prior to admission  Medication Sig Dispense Refill Last Dose  . Cholecalciferol (VITAMIN D) 2000 units tablet Take 4,000 Units by mouth daily.   Past Week at Unknown time  . Cyanocobalamin (VITAMIN B 12) 100 MCG LOZG Take by mouth.   Past Week at Unknown time  . cyclobenzaprine (FLEXERIL) 10 MG tablet Take by mouth.   Past Week at Unknown time  . dicyclomine (BENTYL) 20 MG tablet Take by mouth.   02/21/2016 at Unknown time  . diphenhydrAMINE (BENADRYL) 25 mg capsule Take by mouth.   Past Week at Unknown time  . DULoxetine (CYMBALTA) 60 MG capsule Take 1 capsule (60 mg total) by mouth daily. 30 capsule 1 02/21/2016 at Unknown time  . ergocalciferol (VITAMIN D2) 50000 units capsule Take 50,000 Units by mouth once a week.   Past Week at Unknown time  . fluticasone  (FLONASE) 50 MCG/ACT nasal spray Place into both nostrils daily.   Past Week at Unknown time  . gabapentin (NEURONTIN) 600 MG tablet Take 900 mg by mouth.    02/21/2016 at Unknown time  . HYDROcodone-acetaminophen (NORCO/VICODIN) 5-325 MG tablet Take 1 tablet by mouth every 6 (six) hours as needed for moderate pain. 10 tablet 0 Past Week at Unknown time  . LORazepam (ATIVAN) 1 MG tablet Take 1 tablet (1 mg total) by mouth 2 (two) times daily. 60 tablet 2 02/21/2016 at Unknown time  . Multiple Vitamin (MULTI-VITAMINS) TABS Take by mouth.   Past Week at Unknown time  . pantoprazole (PROTONIX) 40 MG tablet Take by mouth.   02/21/2016 at Unknown time  . polyethylene glycol powder (GLYCOLAX/MIRALAX) powder Reported on 02/14/2016   02/21/2016 at Unknown time  . simvastatin (ZOCOR) 20 MG tablet Take 1 tablet (20 mg total) by mouth at bedtime. 90 tablet 0 02/21/2016 at Unknown time  . metoCLOPramide (REGLAN) 10 MG tablet Take 10 mg by mouth 4 (four) times daily. Reported on 02/22/2016   Not Taking at Unknown time     Allergies  Allergen Reactions  . Food     PEAS  . Other     FEATHERS     Past Medical History  Diagnosis Date  . High cholesterol   . Depression   . Anxiety   . Chronic pain   . DJD (degenerative joint disease)  lumbar  . Fibromyalgia     Review of systems:      Physical Exam    Heart and lungs: Regular rate and rhythm without rub or gallop, lungs are bilaterally clear.    HEENT: Normocephalic atraumatic eyes are anicteric    Other:     Pertinant exam for procedure: Soft mild discomfort in the lower abdomen no masses or rebound. Bowel sounds positive normoactive.    Planned proceedures: EGD and colonoscopy with indicated procedures. I have discussed the risks benefits and complications of procedures to include not limited to bleeding, infection, perforation and the risk of sedation and the patient wishes to proceed.    Lollie Sails,  MD Gastroenterology 02/22/2016  1:41 PM

## 2016-02-22 NOTE — Op Note (Signed)
[facility] Gastroenterology Patient Name: Nicole Walter Procedure Date: 02/22/2016 1:42 PM MRN: [id number] Account #: [id number] Date of Birth: 1961/08/11 Admit Type: Outpatient Age: 55 Room: [facility] ENDO ROOM 3 Gender: Female Note Status: Finalized Procedure:            Upper GI endoscopy Indications:          Periumbilical abdominal pain Providers:            Lollie Sails, MD Referring MD:         Lorane Gell (Referring MD) Medicines:            Monitored Anesthesia Care Complications:        No immediate complications. Procedure:            Pre-Anesthesia Assessment:                       - ASA Grade Assessment: II - A patient with mild                        systemic disease.                       After obtaining informed consent, the endoscope was                        passed under direct vision. Throughout the procedure,                        the patient's blood pressure, pulse, and oxygen                        saturations were monitored continuously. The                        Colonoscope was introduced through the mouth, and                        advanced to the third part of duodenum. The upper GI                        endoscopy was performed with moderate difficulty due to                        patient intolerance of esophageal intubation.                        Successful completion of the procedure was aided by                        increasing the dose of sedation medication and                        Anesthesia staff assisting with sedation. Findings:      LA Grade A (one or more mucosal breaks less than 5 mm, not extending       between tops of 2 mucosal folds) esophagitis with no bleeding was found.       Biopsies were taken with a cold forceps for histology.      The exam of the esophagus was otherwise normal.      Patchy minimal inflammation characterized by erythema was found  in the       gastric body. Biopsies were taken with a  cold forceps for histology.       Biopsies were taken with a cold forceps for histology and Helicobacter       pylori testing.      The cardia and gastric fundus were normal on retroflexion.      The exam of the stomach was otherwise normal.      Patchy moderate inflammation characterized by congestion (edema),       erosions and erythema was found in the duodenal bulb. Impression:           - LA Grade A reflux esophagitis. Biopsied.                       - Bile gastritis. Biopsied.                       - Erosive duodenitis. Recommendation:       - Use Protonix (pantoprazole) 40 mg PO daily daily. Procedure Code(s):    --- Professional ---                       [phone], Esophagogastroduodenoscopy, flexible, transoral;                        with biopsy, single or multiple Diagnosis Code(s):    --- Professional ---                       K21.0, Gastro-esophageal reflux disease with esophagitis                       K29.60, Other gastritis without bleeding                       K29.80, Duodenitis without bleeding                       [id number], Periumbilical pain CPT copyright 2016 American Medical Association. All rights reserved. The codes documented in this report are preliminary and upon coder review may  be revised to meet current compliance requirements. Lollie Sails, MD 02/22/2016 3:00:28 PM This report has been signed electronically. Number of Addenda: 0 Note Initiated On: 02/22/2016 1:42 PM Scope Withdrawal Time: 0 hours 15 minutes 55 seconds  Total Procedure Duration: 0 hours 28 minutes 30 seconds       [facility]

## 2016-02-22 NOTE — Anesthesia Preprocedure Evaluation (Addendum)
Anesthesia Evaluation  Patient identified by MRN, date of birth, ID band Patient awake    History of Anesthesia Complications Negative for: history of anesthetic complications  Airway Mallampati: I  TM Distance: >3 FB Neck ROM: Full    Dental  (+) Teeth Intact, Caps Some missing teeth:   Pulmonary neg shortness of breath, neg sleep apnea, neg COPD, neg recent URI, former smoker,    Pulmonary exam normal        Cardiovascular Exercise Tolerance: Good negative cardio ROS   Rhythm:Regular Rate:Normal     Neuro/Psych neg Seizures PSYCHIATRIC DISORDERS Anxiety Depression  Neuromuscular disease (fibromylagia)    GI/Hepatic Neg liver ROS, GERD  ,  Endo/Other  negative endocrine ROS  Renal/GU negative Renal ROS  negative genitourinary   Musculoskeletal  (+) Arthritis , Osteoarthritis,  Fibromyalgia -  Abdominal Normal abdominal exam  (+)   Peds negative pediatric ROS (+)  Hematology negative hematology ROS (+)   Anesthesia Other Findings Anxiety and depression   Chronic pain associated with significant psychosocial dysfunction  Degenerative arthritis of lumbar spine  Combined fat and carbohydrate induced hyperlipemia  Generalized abdominal pain  Encounter to establish care  Rash of back  Thoracic back pain  Cervical pain  Concentration deficit  Major depressive disorder, recurrent episode, moderate (HCC)  Hyperlipidemia    Reproductive/Obstetrics negative OB ROS                           Anesthesia Physical Anesthesia Plan  ASA: II  Anesthesia Plan: General   Post-op Pain Management:    Induction: Intravenous  Airway Management Planned:   Additional Equipment:   Intra-op Plan:   Post-operative Plan:   Informed Consent: I have reviewed the patients History and Physical, chart, labs and discussed the procedure including the risks, benefits and alternatives for the  proposed anesthesia with the patient or authorized representative who has indicated his/her understanding and acceptance.   Dental Advisory Given  Plan Discussed with: CRNA, Anesthesiologist and Surgeon  Anesthesia Plan Comments:        Anesthesia Quick Evaluation

## 2016-02-22 NOTE — Transfer of Care (Signed)
Immediate Anesthesia Transfer of Care Note  Patient: Nicole Walter  Procedure(s) Performed: Procedure(s): ESOPHAGOGASTRODUODENOSCOPY (EGD) WITH PROPOFOL (N/A) COLONOSCOPY WITH PROPOFOL (N/A)  Patient Location: PACU  Anesthesia Type:General  Level of Consciousness: awake, alert  and oriented  Airway & Oxygen Therapy: Patient Spontanous Breathing and Patient connected to nasal cannula oxygen  Post-op Assessment: Report given to RN and Post -op Vital signs reviewed and stable  Post vital signs: Reviewed and stable  Last Vitals:  Filed Vitals:   02/22/16 1300 02/22/16 1500  BP: 113/74 109/65  Pulse: 77 81  Temp: 36.3 C 35.9 C  Resp: 20 16    Complications: No apparent anesthesia complications

## 2016-02-22 NOTE — Op Note (Addendum)
[facility] Gastroenterology Patient Name: Nicole Walter Procedure Date: 02/22/2016 1:41 PM MRN: [id number] Account #: [id number] Date of Birth: May 15, 1961 Admit Type: Outpatient Age: 55 Room: [facility] ENDO ROOM 3 Gender: Female Note Status: Finalized Procedure:            Colonoscopy Indications:          Generalized abdominal pain, Change in bowel habits Providers:            Lollie Sails, MD Referring MD:         Velora Heckler. Doss (Referring MD) Medicines:            Monitored Anesthesia Care Complications:        No immediate complications. Procedure:            Pre-Anesthesia Assessment:                       - ASA Grade Assessment: II - A patient with mild                        systemic disease.                       After obtaining informed consent, the colonoscope was                        passed under direct vision. Throughout the procedure,                        the patient's blood pressure, pulse, and oxygen                        saturations were monitored continuously. The                        Colonoscope was introduced through the anus and                        advanced to the the cecum, identified by appendiceal                        orifice and ileocecal valve. The colonoscopy was                        performed with moderate difficulty due to a tortuous                        colon. The patient tolerated the procedure well. The                        quality of the bowel preparation was good. Findings:      Two sessile polyps were found in the rectum. The polyps were 2 to 3 mm       in size. These polyps were removed with a cold biopsy forceps. Resection       and retrieval were complete.      Two flat polyps were found in the hepatic flexure. The polyps were 1 to       2 mm in size. These polyps were removed with a cold biopsy forceps.       Resection and retrieval were complete.      Biopsies  for histology were taken with a cold  forceps from the right       colon and left colon for evaluation of microscopic colitis.      Multiple medium-mouthed diverticula were found in the descending colon.      The retroflexed view of the distal rectum and anal verge was normal and       showed no anal or rectal abnormalities.      The digital rectal exam was normal. Impression:           - Two 2 to 3 mm polyps in the rectum, removed with a                        cold biopsy forceps. Resected and retrieved.                       - Two 1 to 2 mm polyps at the hepatic flexure, removed                        with a cold biopsy forceps. Resected and retrieved.                       - Diverticulosis in the descending colon.                       - The distal rectum and anal verge are normal on                        retroflexion view.                       - Biopsies were taken with a cold forceps from the                        right colon and left colon for evaluation of                        microscopic colitis. Recommendation:       - Discharge patient to home.                       - Await pathology results.                       - Use Levbid 0.375 mg Extended Tabs 1 tab PO BID.                       - Use Citrucel one tablespoon PO daily daily.                       - Return to GI clinic in 5 weeks.                       - Discharge patient to home. Procedure Code(s):    --- Professional ---                       [phone], Colonoscopy, flexible; with biopsy, single or                        multiple Diagnosis Code(s):    --- Professional ---  K62.1, Rectal polyp                       D12.3, Benign neoplasm of transverse colon (hepatic                        flexure or splenic flexure)                       R10.84, Generalized abdominal pain                       R19.4, Change in bowel habit                       K57.30, Diverticulosis of large intestine without                        perforation or  abscess without bleeding CPT copyright 2016 American Medical Association. All rights reserved. The codes documented in this report are preliminary and upon coder review may  be revised to meet current compliance requirements. Lollie Sails, MD 02/22/2016 2:38:00 PM This report has been signed electronically. Number of Addenda: 0 Note Initiated On: 02/22/2016 1:41 PM      [facility]

## 2016-02-23 ENCOUNTER — Telehealth: Payer: Self-pay | Admitting: Nurse Practitioner

## 2016-02-23 ENCOUNTER — Encounter: Payer: Self-pay | Admitting: Gastroenterology

## 2016-02-23 NOTE — Anesthesia Postprocedure Evaluation (Addendum)
Anesthesia Post Note  Patient: Joanette Hoelzel  Procedure(s) Performed: Procedure(s) (LRB): ESOPHAGOGASTRODUODENOSCOPY (EGD) WITH PROPOFOL (N/A) COLONOSCOPY WITH PROPOFOL (N/A)  Patient location during evaluation: Endoscopy Anesthesia Type: General Level of consciousness: awake and alert Pain management: pain level controlled Vital Signs Assessment: post-procedure vital signs reviewed and stable Respiratory status: spontaneous breathing, nonlabored ventilation, respiratory function stable and patient connected to nasal cannula oxygen Cardiovascular status: blood pressure returned to baseline and stable Postop Assessment: no signs of nausea or vomiting Anesthetic complications: no    Last Vitals:  Filed Vitals:   02/22/16 1520 02/22/16 1530  BP: 123/96 114/65  Pulse: 74 71  Temp:    Resp: 15 23    Last Pain:  Filed Vitals:   02/22/16 1537  PainSc: 5                  Martha Clan

## 2016-02-23 NOTE — Telephone Encounter (Signed)
Patient requesting refill, last fill was 02/10/16 for #10.  Please advise?

## 2016-02-23 NOTE — Telephone Encounter (Signed)
See below

## 2016-02-23 NOTE — Telephone Encounter (Signed)
Pt request a refill on hydrocodone/msn

## 2016-02-24 ENCOUNTER — Ambulatory Visit: Payer: BLUE CROSS/BLUE SHIELD | Admitting: Licensed Clinical Social Worker

## 2016-02-24 ENCOUNTER — Other Ambulatory Visit: Payer: Self-pay | Admitting: Nurse Practitioner

## 2016-02-24 MED ORDER — HYDROCODONE-ACETAMINOPHEN 5-325 MG PO TABS: 1.0000 | ORAL_TABLET | Freq: Four times a day (QID) | ORAL | Status: DC | PRN

## 2016-02-24 NOTE — Telephone Encounter (Signed)
Gave to [place] to call pt and tell her to pick up at front desk.

## 2016-02-24 NOTE — Telephone Encounter (Signed)
Notified pt that Rx is ready to pick up and put in folder at reception area

## 2016-02-27 LAB — SURGICAL PATHOLOGY

## 2016-03-02 ENCOUNTER — Ambulatory Visit
Admission: RE | Admit: 2016-03-02 | Discharge: 2016-03-02 | Disposition: A | Discharge status: Home / Self Care | Payer: BLUE CROSS/BLUE SHIELD | Source: Ambulatory Visit | Attending: Internal Medicine | Admitting: Internal Medicine

## 2016-03-02 DIAGNOSIS — M47812 Spondylosis without myelopathy or radiculopathy, cervical region: Secondary | ICD-10-CM

## 2016-03-02 DIAGNOSIS — M5126 Other intervertebral disc displacement, lumbar region: Secondary | ICD-10-CM | POA: Insufficient documentation

## 2016-03-02 DIAGNOSIS — M47816 Spondylosis without myelopathy or radiculopathy, lumbar region: Secondary | ICD-10-CM | POA: Insufficient documentation

## 2016-03-06 ENCOUNTER — Telehealth: Payer: Self-pay | Admitting: *Deleted

## 2016-03-06 ENCOUNTER — Ambulatory Visit (INDEPENDENT_AMBULATORY_CARE_PROVIDER_SITE_OTHER): Payer: BLUE CROSS/BLUE SHIELD | Admitting: Psychiatry

## 2016-03-06 ENCOUNTER — Encounter: Payer: Self-pay | Admitting: Nurse Practitioner

## 2016-03-06 ENCOUNTER — Telehealth: Payer: Self-pay

## 2016-03-06 ENCOUNTER — Ambulatory Visit: Payer: BLUE CROSS/BLUE SHIELD | Admitting: Psychiatry

## 2016-03-06 ENCOUNTER — Encounter: Payer: Self-pay | Admitting: Psychiatry

## 2016-03-06 ENCOUNTER — Other Ambulatory Visit: Payer: Self-pay | Admitting: Nurse Practitioner

## 2016-03-06 VITALS — BP 110/68 | HR 88 | Temp 97.0°F | Ht 64.0 in | Wt 170.8 lb

## 2016-03-06 DIAGNOSIS — F331 Major depressive disorder, recurrent, moderate: Secondary | ICD-10-CM | POA: Diagnosis not present

## 2016-03-06 MED ORDER — LITHIUM CARBONATE ER 300 MG PO TBCR: 300.0000 mg | EXTENDED_RELEASE_TABLET | Freq: Every day | ORAL | Status: DC

## 2016-03-06 NOTE — Telephone Encounter (Signed)
PT STATES SHE HAS CONCERNS WITH THE LITHIUM.  ONE SHE WAS TOLD THAT SHE SHOULD NOT TAKE CYMBALTA AND LITHIUM TOGETHER. AND SHE ALSO CONCERN WITH GAINING WEIGHT AND HAVING NO ENERGY. PT WOULD LIKE FOR YOU TO CALL HER

## 2016-03-06 NOTE — Telephone Encounter (Signed)
Patient requested a medication refill for hydrocodone, she requested to have the mg increased, due to arthritis. Pt Contact [phone]

## 2016-03-06 NOTE — Telephone Encounter (Signed)
Request sent via refill request

## 2016-03-06 NOTE — Progress Notes (Signed)
Psychiatric Initial Adult Assessment   Patient Identification: Dayanne Elfering MRN:  [id number] Date of Evaluation:  03/06/2016 Referral Source: Stanton Kidney- Therapist Chief Complaint:   Chief Complaint    Establish Care; Anxiety; Depression; Fatigue; Stress     Visit Diagnosis:    ICD-9-CM ICD-10-CM   1. MDD (major depressive disorder), recurrent episode, moderate (HCC) 296.32 F33.1     History of Present Illness:    Patient is a 55 year old married female who presented for initial assessment. She was referred by [facility] therapist in our office. She reported that she has long history of depression and anxiety and fibromyalgia. She has been following with her primary care physician and they have been prescribing her Ativan 1 mg by mouth twice a day and Cymbalta 60 mg daily. She also went to see psychologist Grandville Silos to have the ADD evaluation done. However he did not diagnose her with ADD. Patient reported that she had the accident at work in 2011 in which she injured her ankle and had the surgery done in 2012. She had and her surgery in 2013. Since then she has not been able to work. She reported that she has some back issues and has also been diagnosed with fibromyalgia. She has tried several psycho topic medications with the help of her primary care physicians. She has just started following with a new PCP. She reported that she was doing well with Zoloft and Lexapro in the past. However she became " emotionless" with Zoloft and did not cry at the death of her father. She did not like that feeling and decided to switch to another medication. She reported that she initially started with Cymbalta 20 mg and gradually titrated to 60 mg to help with her pain of fibromyalgia. She does not know if she is doing well on the medication. She is also taking Neurontin at this time. She stated that she takes Ativan 1 mg twice daily to help with anxiety. She was previously taking Xanax but she had to take up to 6 mg  and was addicted to the medication. She gradually tapered out of the medication.  Patient was tearful during the interview as she was discussing the relationship issues between her son and her daughter. She reported that she does not like this feeling as she did not raise her children like that. She wants them to have a good relationship. She currently denied having any problems with them. She also stated that she keeps on picking on herself when she becomes impulsive and has some small scars on her hands and her feet.    She currently denied using any drugs. She denied using any alcohol. She denied having any suicidal homicidal ideations or plans   Associated Signs/Symptoms: Depression Symptoms:  depressed mood, fatigue, difficulty concentrating, anxiety, disturbed sleep, weight gain, (Hypo) Manic Symptoms:  Distractibility, Flight of Ideas, Impulsivity, Irritable Mood, Labiality of Mood, Anxiety Symptoms:  Excessive Worry, Psychotic Symptoms:  none PTSD Symptoms: Negative NA  Past Psychiatric History:  Patient has been tried on several psychotropic medications by her primary care physician. She denied any previous history of suicide attempts never been admitted to a psychiatric hospital  Previous Psychotropic Medications:  Zoloft Lexapro Effexor Chantix Wellbutrin Valium Xanax  Substance Abuse History in the last 12 months:  No.  Consequences of Substance Abuse: Negative NA  Past Medical History:  Past Medical History  Diagnosis Date  . High cholesterol   . Depression   . Anxiety   . Chronic pain   .  DJD (degenerative joint disease)     lumbar  . Fibromyalgia     Past Surgical History  Procedure Laterality Date  . Cholecystectomy    . Tubal ligation    . Ankle surgery      x 2  . Nasal sinus surgery    . Tonsillectomy    . Colonoscopy    . Esophagogastroduodenoscopy (egd) with propofol N/A 02/22/2016    Procedure: ESOPHAGOGASTRODUODENOSCOPY (EGD) WITH  PROPOFOL;  Surgeon: Lollie Sails, MD;  Location: [facility] ENDOSCOPY;  Service: Endoscopy;  Laterality: N/A;  . Colonoscopy with propofol N/A 02/22/2016    Procedure: COLONOSCOPY WITH PROPOFOL;  Surgeon: Lollie Sails, MD;  Location: [facility] ENDOSCOPY;  Service: Endoscopy;  Laterality: N/A;    Family Psychiatric History:  She denied having any history of psychiatric issues in her family members  Family History:  Family History  Problem Relation Age of Onset  . Heart disease Mother   . Stroke Mother   . Cancer Father     lung  . Arthritis Father   . Arthritis Paternal Grandmother   . Arthritis Paternal Grandfather   . Drug abuse Sister   . Drug abuse Brother   . Post-traumatic stress disorder Brother   . Diabetes Sister   . Dementia Sister   . Drug abuse Brother   . Cancer Brother     Social History:   Social History   Social History  . Marital Status: Married    Spouse Name: N/A  . Number of Children: N/A  . Years of Education: N/A   Social History Main Topics  . Smoking status: Former Smoker    Start date: 03/06/1974    Quit date: 07/08/2015  . Smokeless tobacco: Never Used  . Alcohol Use: No  . Drug Use: No  . Sexual Activity: Yes    Birth Control/ Protection: None   Other Topics Concern  . None   Social History Narrative    Additional Social History:  She is currently married and lives with her husband. She has 2 children. She is currently unemployed.  Allergies:   Allergies  Allergen Reactions  . Food     PEAS  . Other     FEATHERS    Metabolic Disorder Labs: No results found for: HGBA1C, MPG No results found for: PROLACTIN Lab Results  Component Value Date   CHOL 256* 02/14/2016   TRIG 158.0* 02/14/2016   HDL 58.10 02/14/2016   CHOLHDL 4 02/14/2016   VLDL 31.6 02/14/2016   LDLCALC 166* 02/14/2016     Current Medications: Current Outpatient Prescriptions  Medication Sig Dispense Refill  . Cholecalciferol (VITAMIN D) 2000 units  tablet Take 4,000 Units by mouth daily.    . Cyanocobalamin (VITAMIN B 12) 100 MCG LOZG Take by mouth.    . cyclobenzaprine (FLEXERIL) 10 MG tablet Take by mouth.    . dicyclomine (BENTYL) 20 MG tablet Take by mouth.    . diphenhydrAMINE (BENADRYL) 25 mg capsule Take by mouth.    . DULoxetine (CYMBALTA) 60 MG capsule Take 1 capsule (60 mg total) by mouth daily. 30 capsule 1  . gabapentin (NEURONTIN) 300 MG capsule   0  . gabapentin (NEURONTIN) 600 MG tablet Take 900 mg by mouth.     Marland Kitchen HYDROcodone-acetaminophen (NORCO/VICODIN) 5-325 MG tablet Take 1 tablet by mouth every 6 (six) hours as needed for moderate pain. 20 tablet 0  . LORazepam (ATIVAN) 1 MG tablet Take 1 tablet (1 mg total) by mouth 2 (  two) times daily. 60 tablet 2  . Multiple Vitamin (MULTI-VITAMINS) TABS Take by mouth.    . pantoprazole (PROTONIX) 40 MG tablet Take by mouth.    . polyethylene glycol powder (GLYCOLAX/MIRALAX) powder Reported on 02/14/2016    . simvastatin (ZOCOR) 20 MG tablet Take 1 tablet (20 mg total) by mouth at bedtime. 90 tablet 0   No current facility-administered medications for this visit.    Neurologic: Headache: No Seizure: No Paresthesias:No  Musculoskeletal: Strength & Muscle Tone: within normal limits Gait & Station: normal Patient leans: N/A  Psychiatric Specialty Exam: ROS  Blood pressure 110/68, pulse 88, temperature 97 F (36.1 C), temperature source Tympanic, height 5\' 4"  (1.626 m), weight 170 lb 12.8 oz (77.474 kg), SpO2 97 %.Body mass index is 29.3 kg/(m^2).  General Appearance: Casual  Eye Contact:  Fair  Speech:  Clear and Coherent  Volume:  Normal  Mood:  Anxious and Depressed  Affect:  Congruent and Tearful  Thought Process:  Goal Directed  Orientation:  Full (Time, Place, and Person)  Thought Content:  WDL  Suicidal Thoughts:  No  Homicidal Thoughts:  No  Memory:  Immediate;   Fair  Judgement:  Fair  Insight:  Fair  Psychomotor Activity:  Normal  Concentration:  Fair   Recall:  [employer] of Knowledge:Fair  Language: Fair  Akathisia:  No  Handed:  Right  AIMS (if indicated):    Assets:  Communication Skills Desire for Improvement Physical Health Social Support  ADL's:  Intact  Cognition: WNL  Sleep:  fair    Treatment Plan Summary: Medication management  Discussed with patient about her medications treatment risks benefits and alternatives. She will continue on Cymbalta 60 mg daily. She will also continue on Ativan 1 mg by mouth twice a day  These medications are prescribed by her primary care physician.  I will start her on lithium 300 mg daily at bedtime to help with her impulsive behavior and her depression and discussed with her in detail and she demonstrated understanding.   More than 50% of the time spent in psychoeducation, counseling and coordination of care.   Follow-up in 1 month   This note was generated in part or whole with voice recognition software. Voice regonition is usually quite accurate but there are transcription errors that can and very often do occur. I apologize for any typographical errors that were not detected and corrected.      Rainey Pines, MD 5/1/20178:55 AM

## 2016-03-06 NOTE — Telephone Encounter (Signed)
Refilled on 02/24/16. Patient is asking for an increase in dosage due to arthritis. Please advise?

## 2016-03-07 ENCOUNTER — Telehealth: Payer: Self-pay

## 2016-03-07 MED ORDER — HYDROCODONE-ACETAMINOPHEN 5-325 MG PO TABS: 1.0000 | ORAL_TABLET | Freq: Four times a day (QID) | ORAL | Status: DC | PRN

## 2016-03-07 NOTE — Telephone Encounter (Signed)
She cannot have an increase in the dosage, but can have a few more to hold her over longer. Gave script to CMA to notify pt of pick up.

## 2016-03-07 NOTE — Telephone Encounter (Signed)
Called patient and informed Rx ready for pick up at front desk. Patient verbalized understanding.  

## 2016-03-07 NOTE — Telephone Encounter (Signed)
Spoke to patient earlier per previous note.

## 2016-03-08 NOTE — Telephone Encounter (Signed)
Called her at the number provided. She was not available and LM for her.  Please make appointment to discuss meds if she calls again

## 2016-03-09 ENCOUNTER — Encounter: Payer: Self-pay | Admitting: Dietician

## 2016-03-09 ENCOUNTER — Encounter: Payer: BLUE CROSS/BLUE SHIELD | Attending: Nurse Practitioner | Admitting: Dietician

## 2016-03-09 VITALS — Ht 64.0 in | Wt 171.6 lb

## 2016-03-09 DIAGNOSIS — E785 Hyperlipidemia, unspecified: Secondary | ICD-10-CM | POA: Diagnosis not present

## 2016-03-09 DIAGNOSIS — E669 Obesity, unspecified: Secondary | ICD-10-CM

## 2016-03-09 NOTE — Patient Instructions (Signed)
   Limit intake of coke. Choose mostly water, sugar free flavored water, diet tea/Zeitler tea.   Begin some regular exercise at the [facility]. Start slow/ short duration, and gradually increase as able.  Plan meals ahead, before grocery shopping. Plan to include a vegetable or fruit or both with each meal.

## 2016-03-09 NOTE — Progress Notes (Signed)
Medical Nutrition Therapy: Visit start time: 0900  end time: 1000  Assessment:  Diagnosis: obesity Past medical history: hyperlipidemia, fibromyalgia, osteopenia, arthritis, abdominal bloating and pain, history of cholecystectomy.  Psychosocial issues/ stress concerns: depression recently; is seeing therapist and taking meds.  Preferred learning method:  . Hands-on  Current weight: 171.6lbs  Height: 5'4" Medications, supplements: reviewed list in chart with patient  Progress and evaluation: Patient reports significant weight gain in the past few years, with emotional eating following multiple deaths in her family.          She reports drinking regular coca cola for the past few years.          Trying to limit sweets, but difficult.  Physical activity: none at this time; has pool at home and [employer].  Dietary Intake:  Usual eating pattern includes 3 meals and 1-2 snacks per day. Dining out frequency: 1 meal per month.  Breakfast: whole grain cereal like wheat chex or raisin bran Snack: none Lunch: lunchable Snack: chips sometimes Supper: 5/3 shrimp, baked potato Snack: occasionally snack of chips if watching TV Beverages: coca cola, water  Nutrition Care Education: Topics covered: hyperlipidemia, weight management Basic nutrition: basic food groups, appropriate nutrient balance, appropriate meal and snack schedule, general nutrition guidelines    Weight control: behavioral changes for weight loss: eating slowly, using small plates, chewing food thoroughly; 1250kcal meal plan guidance with 45%CHO, 25%protein, 30%fat; importance of low sugar and low fat food choices Hyperlipidemia: healthy and unhealthy fats, role of fiber, food sources of folate, phytochemicals, phytosterols Other lifestyle changes: options for making changes, i.e. Smaller portions or gradual changes, keeping foods out of the home. Benefits of increasing physical activity.   Nutritional Diagnosis:  [place]-3.3  Overweight/obesity As related to history of excess caloric intake, inactivity.  As evidenced by patient report.  Intervention: Instruction as noted above.   Set goals with patient input.   Education Materials given:  . General diet guidelines for Cholesterol-lowering/ Heart health . Food lists/ Planning A Balanced Meal . Sample meal pattern/ menus: Quick and Healthy Meal Ideas . Snacking handout . Goals/ instructions  Learner/ who was taught:  . Patient   Level of understanding: Marland Kitchen Verbalizes/ demonstrates competency  Demonstrated degree of understanding via:   Teach back Learning barriers: . None  Willingness to learn/ readiness for change: . Acceptance, ready for change  Monitoring and Evaluation:  Dietary intake, exercise, and body weight      follow up: 04/07/16

## 2016-03-10 ENCOUNTER — Other Ambulatory Visit: Payer: Self-pay | Admitting: Nurse Practitioner

## 2016-03-14 ENCOUNTER — Ambulatory Visit (INDEPENDENT_AMBULATORY_CARE_PROVIDER_SITE_OTHER): Payer: BLUE CROSS/BLUE SHIELD | Admitting: Licensed Clinical Social Worker

## 2016-03-14 DIAGNOSIS — F331 Major depressive disorder, recurrent, moderate: Secondary | ICD-10-CM | POA: Diagnosis not present

## 2016-03-14 NOTE — Progress Notes (Signed)
   THERAPIST PROGRESS NOTE  Session Time: 8:45 AM-9:41 AM  Participation Level: Active  Behavioral Response: CasualAlertDysphoric  Type of Therapy: Individual Therapy  Treatment Goals addressed: Coping and Diagnosis: Major Depressive disorder, recurrent, moderate, address life stressors  Interventions: Supportive, Family Systems and Reframing, coping strategies to manage depression  Summary: Nicole Walter is a 55 y.o. female who presents with reviewing what has happened since she last saw this therapist. She went to see a doctor to test for ADD and he said that she did not have this diagnosis but he brought out a lot in her. He reviewed significant events from her past that patient reviewed in session including her father leaving at 22 years old, moving at 51, partying with her niece at a young age, and laid off at work in 3. She feels that discussing events from her past will help with her depression. She ties quitting cigarette smoking as well at different periods to increase in depression. Discussed family conflict and patient became tearful as her children are in relationships and as a result there is a strain between the children. Discussed sister at home as a stressor but also has related that she will need to move out. Discussed medications and feels that they are not helpful and she still has not energy.   Suicidal/Homicidal: No  Therapist Response: Therapist used supportive interventions as normalized feelings related to family conflict as every family is faced with issues. Worked insight building to help patient realize she can't control other people's behaviors but only her own behaviors. Reviewed past history patient to process her feelings and identify underlying sources for her depression. Discussed strategies to help her with depressive symptoms of lack of energy. Encouraged patient to review medications with doctor.   Plan: 1.Return again in 2 week.2.Patient continue to work  through past issues from the past and learn and apply coping strategies for depression.   Diagnosis: Axis I: Major Depression, Recurrent moderate    Axis II: n/a    Bowman,Mary A, LCSW 03/14/2016

## 2016-03-15 ENCOUNTER — Encounter: Payer: Self-pay | Admitting: Nurse Practitioner

## 2016-03-15 ENCOUNTER — Ambulatory Visit (INDEPENDENT_AMBULATORY_CARE_PROVIDER_SITE_OTHER): Payer: BLUE CROSS/BLUE SHIELD | Admitting: Nurse Practitioner

## 2016-03-15 VITALS — BP 102/68 | HR 89 | Temp 98.3°F | Ht 64.0 in | Wt 168.0 lb

## 2016-03-15 DIAGNOSIS — G894 Chronic pain syndrome: Secondary | ICD-10-CM

## 2016-03-15 DIAGNOSIS — R1084 Generalized abdominal pain: Secondary | ICD-10-CM

## 2016-03-15 DIAGNOSIS — E785 Hyperlipidemia, unspecified: Secondary | ICD-10-CM

## 2016-03-15 DIAGNOSIS — R41840 Attention and concentration deficit: Secondary | ICD-10-CM

## 2016-03-15 DIAGNOSIS — M546 Pain in thoracic spine: Secondary | ICD-10-CM

## 2016-03-15 DIAGNOSIS — F331 Major depressive disorder, recurrent, moderate: Secondary | ICD-10-CM | POA: Diagnosis not present

## 2016-03-15 MED ORDER — LORAZEPAM 1 MG PO TABS: 1.0000 mg | ORAL_TABLET | Freq: Two times a day (BID) | ORAL | Status: DC

## 2016-03-15 MED ORDER — HYDROCODONE-ACETAMINOPHEN 5-325 MG PO TABS: 1.0000 | ORAL_TABLET | Freq: Four times a day (QID) | ORAL | Status: DC | PRN

## 2016-03-15 NOTE — Progress Notes (Signed)
Pre visit review using our clinic review tool, if applicable. No additional management support is needed unless otherwise documented below in the visit note. 

## 2016-03-15 NOTE — Progress Notes (Signed)
Patient ID: Nicole Walter, female    DOB: 02-02-1961  Age: 55 y.o. MRN: [id number]  CC: Follow-up   HPI Nicole Walter presents for follow up of chronic pain, depression and anxiety.   1) Omega-3 taking 1 cap daily   Multi-vitamin over 30 yo version Dr. Maggie Font for ADHD consultation and will continue to see him for counseling  2) Ativan- 1 mg since 1993 she reports, and Dr. Gretel Acre will not write for the medication according to the patient and she deferred to me per her chart, the PCP, to write.   3) Lost 3 lbs in 10 days cutting out coke, portion, eating a healthier array of foods.   4) Gabapentin 600 mg twice daily  Seeing Dr. Sharlet Salina  Pt is taking Vicodin every 4-6 hrs and the last prescription isn't lasting more than 2 weeks she reports. She has been on this medication historically for a "long time" and her last Dr. At [facility] was prescribing.   History Nicole Walter has a past medical history of High cholesterol; Depression; Anxiety; Chronic pain; DJD (degenerative joint disease); and Fibromyalgia.   She has past surgical history that includes Cholecystectomy; Tubal ligation; Ankle surgery; Nasal sinus surgery; Tonsillectomy; Colonoscopy; Esophagogastroduodenoscopy (egd) with propofol (N/A, 02/22/2016); and Colonoscopy with propofol (N/A, 02/22/2016).   Her family history includes Arthritis in her father, paternal grandfather, and paternal grandmother; Cancer in her brother and father; Dementia in her sister; Diabetes in her sister; Drug abuse in her brother, brother, and sister; Heart disease in her mother; Post-traumatic stress disorder in her brother; Stroke in her mother.She reports that she quit smoking about 8 months ago. She started smoking about 42 years ago. She has never used smokeless tobacco. She reports that she does not drink alcohol or use illicit drugs.  Outpatient Prescriptions Prior to Visit  Medication Sig Dispense Refill  . Cholecalciferol (VITAMIN D) 2000 units tablet  Take 4,000 Units by mouth daily.    . Cyanocobalamin (VITAMIN B 12) 100 MCG LOZG Take 5,000 mcg by mouth.     . cyclobenzaprine (FLEXERIL) 10 MG tablet Take by mouth. Taking as needed    . dicyclomine (BENTYL) 20 MG tablet Take by mouth.    . diphenhydrAMINE (BENADRYL) 25 mg capsule Take by mouth.    . DULoxetine (CYMBALTA) 60 MG capsule Take 1 capsule (60 mg total) by mouth daily. 30 capsule 1  . gabapentin (NEURONTIN) 600 MG tablet Take 600 mg by mouth 2 (two) times daily.     . Multiple Vitamin (MULTI-VITAMINS) TABS Take by mouth.    . pantoprazole (PROTONIX) 40 MG tablet Take by mouth.    . simvastatin (ZOCOR) 20 MG tablet take 1 tablet by mouth at bedtime 90 tablet 3  . gabapentin (NEURONTIN) 300 MG capsule   0  . HYDROcodone-acetaminophen (NORCO/VICODIN) 5-325 MG tablet Take 1 tablet by mouth every 6 (six) hours as needed for moderate pain. 30 tablet 0  . LORazepam (ATIVAN) 1 MG tablet Take 1 tablet (1 mg total) by mouth 2 (two) times daily. 60 tablet 2  . polyethylene glycol powder (GLYCOLAX/MIRALAX) powder Reported on 03/09/2016    . lithium carbonate (LITHOBID) 300 MG CR tablet Take 1 tablet (300 mg total) by mouth at bedtime. (Patient not taking: Reported on 03/15/2016) 30 tablet 0   No facility-administered medications prior to visit.    ROS Review of Systems  Constitutional: Negative for fever, chills, diaphoresis and fatigue.  Respiratory: Negative for chest tightness, shortness of breath and wheezing.  Cardiovascular: Negative for chest pain, palpitations and leg swelling.  Gastrointestinal: Negative for nausea, vomiting and diarrhea.  Musculoskeletal: Positive for myalgias, back pain and arthralgias.  Skin: Negative for rash.  Neurological: Negative for dizziness, weakness, numbness and headaches.  Psychiatric/Behavioral: Positive for sleep disturbance. Negative for suicidal ideas, hallucinations, behavioral problems, confusion, self-injury, dysphoric mood, decreased  concentration and agitation. The patient is nervous/anxious. The patient is not hyperactive.        Inattentive     Objective:  BP 102/68 mmHg  Pulse 89  Temp(Src) 98.3 F (36.8 C) (Oral)  Ht 5\' 4"  (1.626 m)  Wt 168 lb (76.204 kg)  BMI 28.82 kg/m2  SpO2 97%  Physical Exam  Constitutional: She is oriented to person, place, and time. She appears well-developed and well-nourished. No distress.  HENT:  Head: Normocephalic and atraumatic.  Right Ear: External ear normal.  Left Ear: External ear normal.  Cardiovascular: Normal rate, regular rhythm and normal heart sounds.   Pulmonary/Chest: Effort normal and breath sounds normal. No respiratory distress. She has no wheezes. She has no rales. She exhibits no tenderness.  Neurological: She is alert and oriented to person, place, and time. No cranial nerve deficit. She exhibits normal muscle tone. Coordination normal.  Skin: Skin is warm and dry. No rash noted. She is not diaphoretic.  Psychiatric: Her behavior is normal. Judgment and thought content normal.  Flat affect with intermittent tearfulness    Assessment & Plan:   There are no diagnoses linked to this encounter. I have discontinued Nicole Walter polyethylene glycol powder and lithium carbonate. I am also having her maintain her MULTI-VITAMINS, gabapentin, dicyclomine, cyclobenzaprine, Vitamin B 12, diphenhydrAMINE, pantoprazole, DULoxetine, Vitamin D, simvastatin, LORazepam, and HYDROcodone-acetaminophen.  Meds ordered this encounter  Medications  . LORazepam (ATIVAN) 1 MG tablet    Sig: Take 1 tablet (1 mg total) by mouth 2 (two) times daily.    Dispense:  60 tablet    Refill:  2    Fill on or after May 10th, 2017    Order Specific Question:  Supervising Provider    Answer:  Deborra Medina L [[id number]]  . HYDROcodone-acetaminophen (NORCO/VICODIN) 5-325 MG tablet    Sig: Take 1 tablet by mouth every 6 (six) hours as needed for moderate pain.    Dispense:  120 tablet    Refill:   0    Order Specific Question:  Supervising Provider    Answer:  Crecencio Mc [[id number]]     Follow-up: Return in about 3 months (around 06/15/2016) for Follow up w/ cholesterol screen/HFP .

## 2016-03-15 NOTE — Patient Instructions (Addendum)
Please establish with a new provider.   It was nice getting to know you.   Continue to follow up with your other providers.

## 2016-03-16 NOTE — Assessment & Plan Note (Signed)
Pt to follow up with Dr. Gretel Acre and Dr. Maggie Font for psychiatry and counseling.

## 2016-03-16 NOTE — Assessment & Plan Note (Signed)
Pt asked about Adderall at today's visit. She has not been diagnosed with ADHD and I advised her to continue to follow up with her psychiatrist.

## 2016-03-16 NOTE — Assessment & Plan Note (Signed)
Patient is seeing Physiatry  Pt had injections Vicodin Q6hrs- patient was told this is the LAST script we will write for this and was given 1 month of this. Will follow with pain management in the future. Discussed her ativan with vicodin usage as being HIGHLY dangerous. Pt verbalized understanding and reports she has been on it for 14 yrs and has had no difficulty with this before. Discussed weaning

## 2016-03-16 NOTE — Assessment & Plan Note (Signed)
Started on Simvastatin 20 mg and 1 cap of fish oil daily Repeat labs in 1 month

## 2016-03-16 NOTE — Assessment & Plan Note (Signed)
Following up with GI.

## 2016-03-16 NOTE — Assessment & Plan Note (Signed)
Pt is followed by Physiatry, psychiatry and psychology currently Pt is on Ativan for anxiety and Vicodin for pain- has been on this combo for "years" without side effects or overdosing historically. Pt was advised of dangerous nature of both. 1 month given due to possibility of withdrawal until she can follow up with her above specialists and get est. With a new PCP.

## 2016-03-21 ENCOUNTER — Telehealth: Payer: Self-pay | Admitting: *Deleted

## 2016-03-21 NOTE — Telephone Encounter (Signed)
It would appear that she is followed by psychiatry for this and saw them earlier this month. I would recommend contacting them to discuss this.

## 2016-03-21 NOTE — Telephone Encounter (Signed)
Patient has requested to have her duloxetine lowered from 60 mg to 40 mg.  She stated the higher dosage is causing her tired,and changing her mood.

## 2016-03-21 NOTE — Telephone Encounter (Signed)
It is ok to decrease to 40 mg daily. She will need to be seen in the office in the next 2-4 weeks to be reevaluated with this dose decrease. If her symptoms do not improve with the decrease in dose she should be seen in the office as well.

## 2016-03-21 NOTE — Telephone Encounter (Signed)
Scheduled patient for 04/06/16 for appointment. Can you please send in new prscription

## 2016-03-21 NOTE — Telephone Encounter (Signed)
Please advise change in dosing, thanks

## 2016-03-21 NOTE — Telephone Encounter (Signed)
SPoke with the patient, she said that the medication has always been from a PCP and she would need to make an appt with a psychiatrist. Please advise?

## 2016-03-22 MED ORDER — DULOXETINE HCL 40 MG PO CPEP: 40.0000 mg | ORAL_CAPSULE | Freq: Every day | ORAL | Status: DC

## 2016-03-22 NOTE — Telephone Encounter (Signed)
New prescription sent to pharmacy. It would be okay to push the appointment out to 4 weeks as long as patient does not develop new symptoms and as long as her symptoms improved with the decrease in dose.

## 2016-03-22 NOTE — Telephone Encounter (Signed)
Patient requested to push the appt date out to four weeks, to give the medication a chance to work with results.

## 2016-03-28 ENCOUNTER — Encounter: Payer: Self-pay | Admitting: Surgical

## 2016-03-28 NOTE — Telephone Encounter (Signed)
Patient has appointment on 04/27/16

## 2016-03-29 ENCOUNTER — Other Ambulatory Visit: Payer: Self-pay | Admitting: Gastroenterology

## 2016-03-29 DIAGNOSIS — R140 Abdominal distension (gaseous): Secondary | ICD-10-CM

## 2016-03-29 DIAGNOSIS — R1033 Periumbilical pain: Secondary | ICD-10-CM

## 2016-04-06 ENCOUNTER — Ambulatory Visit: Payer: No Typology Code available for payment source | Admitting: Psychiatry

## 2016-04-06 ENCOUNTER — Ambulatory Visit: Payer: BLUE CROSS/BLUE SHIELD | Admitting: Family Medicine

## 2016-04-07 ENCOUNTER — Ambulatory Visit
Admission: RE | Admit: 2016-04-07 | Discharge: 2016-04-07 | Disposition: A | Discharge status: Home / Self Care | Payer: BLUE CROSS/BLUE SHIELD | Source: Ambulatory Visit | Attending: Gastroenterology | Admitting: Gastroenterology

## 2016-04-07 ENCOUNTER — Ambulatory Visit: Payer: BLUE CROSS/BLUE SHIELD | Admitting: Dietician

## 2016-04-07 DIAGNOSIS — R140 Abdominal distension (gaseous): Secondary | ICD-10-CM | POA: Insufficient documentation

## 2016-04-07 DIAGNOSIS — R1033 Periumbilical pain: Secondary | ICD-10-CM | POA: Diagnosis present

## 2016-04-07 MED ORDER — TECHNETIUM TC 99M SULFUR COLLOID
2.0000 | Freq: Once | INTRAVENOUS | Status: AC | PRN
  Administered 2016-04-07: 2.17 via INTRAVENOUS

## 2016-04-18 ENCOUNTER — Telehealth: Payer: Self-pay | Admitting: Family Medicine

## 2016-04-18 NOTE — Telephone Encounter (Signed)
Last refill was 03/15/16, #120, please advise for refill, has follow up scheduled with you on the 22nd. thanks

## 2016-04-18 NOTE — Telephone Encounter (Signed)
Pt called stating she needs a refill for HYDROcodone-acetaminophen (NORCO/VICODIN) 5-325 MG tablet. Pt has a scheduled appt on 06/22. Please pt when it's ready.   Call pt @ [phone]. Thank you!

## 2016-04-19 MED ORDER — HYDROCODONE-ACETAMINOPHEN 5-325 MG PO TABS: 1.0000 | ORAL_TABLET | Freq: Four times a day (QID) | ORAL | Status: DC | PRN

## 2016-04-19 NOTE — Telephone Encounter (Signed)
Please determine if the patient is out of this medication. Please also determine if she has seen pain management. It appears per Carrie's last note that the most recent prescription was last prescription for this that she was going to give. This was apparently discussed with the patient per the note. If she is close to out I will refill until she can come in to see me though she needs to know that I do not prescribe chronic narcotics and we will need her to see a pain management physician in the future.

## 2016-04-19 NOTE — Telephone Encounter (Signed)
Spoke with patient, she has seen Dr. Antony Odea, and is scheduled for an injection on the 28th of this month.  With the rain she is having more pain then normal.  She is out of the medication currently.  Thanks

## 2016-04-19 NOTE — Telephone Encounter (Signed)
I will provide a short term refill. In the future this medication needs to be refilled by pain management. Please also ensure that she is aware of the potential for drowsiness with taking this with ativan.

## 2016-04-19 NOTE — Telephone Encounter (Signed)
Left message for patient to return call to office. I am placing Rx at the front desk.

## 2016-04-19 NOTE — Telephone Encounter (Signed)
Pt said she is waiting for a phone call back from office. She did not want to miss the call. Please call her on her cell phone [phone].

## 2016-04-24 ENCOUNTER — Encounter: Payer: BLUE CROSS/BLUE SHIELD | Attending: Nurse Practitioner | Admitting: Dietician

## 2016-04-24 ENCOUNTER — Encounter: Payer: Self-pay | Admitting: Dietician

## 2016-04-24 VITALS — Ht 64.0 in | Wt 166.2 lb

## 2016-04-24 DIAGNOSIS — E785 Hyperlipidemia, unspecified: Secondary | ICD-10-CM

## 2016-04-24 DIAGNOSIS — E669 Obesity, unspecified: Secondary | ICD-10-CM | POA: Diagnosis not present

## 2016-04-24 NOTE — Progress Notes (Signed)
Medical Nutrition Therapy: Visit start time: 0945  end time: 1020  Assessment:  Diagnosis: hyperlipidemia, obesity Medical history changes: none Psychosocial issues/ stress concerns: history of depression, anxiety  Current weight: 166.2lbs  Height: 5'4" Medications, supplement changes: reviewed list in chart; changes are up to date.   Progress and evaluation: Weight loss of 5.6lbs in past 6 weeks; she feels mostly due to decreased intake of coca-cola.          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          She is currently mostly concerned with decreasing "fogginess" feeling with antidepressant, and reducing chronic pain, and feels she will then be able to make more progress with diet and exercise.          She also reports some symptoms of bloating and cramping, but unable to pinpoint cause; endoscopy, gastric emptying tests were normal.   Physical activity: no structured activity; plans to resume as soon as pain eases.   Dietary Intake:  Usual eating pattern includes 3 meals and 0-1 snacks per day.  Breakfast: cereal Lunch: fruit/ dried fruit,  Supper: lean protein, vegetables Snack: reduced evening snacking Beverages: water, diet Dub[place] tea  Nutrition Care Education: Topics covered: weight management, hyperlipidemia Basic nutrition: appropriate nutrient balance Weight control: determining reasonable weight goal, calorie levels for food groups, ways to access caloric information for foods, keeping food diary for calorie intake and GI symptoms.  Hyperlipidemia: healthy and unhealthy fats Other lifestyle changes: resuming exercise  Nutritional Diagnosis:  [place]-3.3 Overweight/obesity As related to history of excess calories, inactivity.  As evidenced by patient report.  Intervention: Discussion as noted above.   Commended patient for progress made.    Updated goals with patient input.    Patient will call to schedule  follow-up if needed after upcoming labwork results.   Education Materials given:  . [employer] guide . Goals/ instructions  Learner/ who was taught:  . Patient   Level of understanding: Marland Kitchen Verbalizes/ demonstrates competency  Demonstrated degree of understanding via:   Teach back Learning barriers: . None  Willingness to learn/ readiness for change: . Eager, change in progress  Monitoring and Evaluation:  Dietary intake, exercise, cholesterol, and body weight      follow up: prn

## 2016-04-24 NOTE — Patient Instructions (Signed)
   Continue making mostly healthy food choices. Increase planning ahead for meals.   Resume some exercise, as pain improves.   If you want to access information on calories, try [email or web address]

## 2016-04-27 ENCOUNTER — Encounter: Payer: Self-pay | Admitting: Family Medicine

## 2016-04-27 ENCOUNTER — Ambulatory Visit (INDEPENDENT_AMBULATORY_CARE_PROVIDER_SITE_OTHER): Payer: BLUE CROSS/BLUE SHIELD | Admitting: Family Medicine

## 2016-04-27 VITALS — BP 117/77 | HR 69 | Temp 97.9°F | Ht 64.0 in | Wt 168.2 lb

## 2016-04-27 DIAGNOSIS — F418 Other specified anxiety disorders: Secondary | ICD-10-CM | POA: Diagnosis not present

## 2016-04-27 DIAGNOSIS — F329 Major depressive disorder, single episode, unspecified: Secondary | ICD-10-CM

## 2016-04-27 DIAGNOSIS — F419 Anxiety disorder, unspecified: Secondary | ICD-10-CM

## 2016-04-27 DIAGNOSIS — L989 Disorder of the skin and subcutaneous tissue, unspecified: Secondary | ICD-10-CM | POA: Diagnosis not present

## 2016-04-27 DIAGNOSIS — F32A Depression, unspecified: Secondary | ICD-10-CM

## 2016-04-27 DIAGNOSIS — G894 Chronic pain syndrome: Secondary | ICD-10-CM

## 2016-04-27 MED ORDER — DULOXETINE HCL 20 MG PO CPEP: 20.0000 mg | ORAL_CAPSULE | Freq: Every day | ORAL | Status: DC

## 2016-04-27 MED ORDER — HYDROCODONE-ACETAMINOPHEN 5-325 MG PO TABS: 1.0000 | ORAL_TABLET | Freq: Four times a day (QID) | ORAL | Status: DC | PRN

## 2016-04-27 NOTE — Assessment & Plan Note (Addendum)
Patient with chronic pain. Seeing physiatry and plans to have injections soon. The plan at this time is to await to see how she responds to the injections and if she still requires pain medicine at that time she'll be referred to a pain clinic. I provided her with a short-term refill of Vicodin and warned against the risks of taking Vicodin and benzodiazepines together. She acknowledged this. She'll continue to monitor. She's given return precautions.

## 2016-04-27 NOTE — Progress Notes (Signed)
Pre visit review using our clinic review tool, if applicable. No additional management support is needed unless otherwise documented below in the visit note. 

## 2016-04-27 NOTE — Patient Instructions (Signed)
Nice to meet you. We are going to refer you to a psychiatrist for further help with her depression and anxiety. Please keep your appointment with physiatry as for injections. I refilled your Vicodin. If you continue to need the Vicodin after your injections we'll refer you to a pain management clinic. If you develop numbness, weakness, loss of bowel or bladder function, numbness in her legs, thoughts of harming herself or others, or new or changing symptoms please seek medical attention.

## 2016-04-27 NOTE — Progress Notes (Signed)
Patient ID: Nicole Walter, female   DOB: [birth year], 55 y.o.   MRN: [id number]  Tommi Rumps, MD Phone: [phone]  Shavaun Brienza is a 55 y.o. female who presents today for follow-up.  Chronic pain: Patient notes she is followed by physiatry and has seen rheumatology for this. She's been on Cymbalta which does not help very much for pain. Also taking gabapentin which does help for her nerve pain from her back. Does not help with her arthritic pain. Does note some neck pain and low back pain. Is having MRI lumbar spine and cervical spine through rheumatology. No radiation down her arms. Does radiate from her back down to her mid thigh posteriorly. No numbness, weakness, loss of bowel or bladder function, saddle anesthesia, fevers, or history of cancer.  Anxiety/depression: Patient notes this is slightly worse. Has been seeing a Social worker. Did see psychiatry though did not care for the psychiatrist. Ativan 1 mg twice daily has been helpful. Cymbalta has not been very helpful. Notes she feels edgy and has little energy on it. Previously tried multiple SSRIs and Wellbutrin with no benefit. Brings up trying Adderall her Abilify to help with this.  Depression screen [facility] 2/9 04/27/2016 03/09/2016  Decreased Interest 3 0  Down, Depressed, Hopeless 1 3  PHQ - 2 Score 4 3  Altered sleeping 1 2  Tired, decreased energy 3 3  Change in appetite 0 2  Feeling bad or failure about yourself  1 2  Trouble concentrating 2 3  Moving slowly or fidgety/restless 1 0  Suicidal thoughts 0 0  PHQ-9 Score 12 15  Difficult doing work/chores Extremely dIfficult Somewhat difficult   GAD 7 : Generalized Anxiety Score 04/27/2016  Nervous, Anxious, on Edge 2  Control/stop worrying 3  Worry too much - different things 3  Trouble relaxing 3  Restless 3  Easily annoyed or irritable 2  Afraid - awful might happen 1  Total GAD 7 Score 17    Reports scattered skin spots that she would like to see a dermatologist for.  She picks at them. Do not itch.  PMH: Former smoker   ROS see history of present illness  Objective  Physical Exam Filed Vitals:   04/27/16 1402  BP: 117/77  Pulse: 69  Temp: 97.9 F (36.6 C)    BP Readings from Last 3 Encounters:  04/27/16 117/77  03/15/16 102/68  03/06/16 110/68   Wt Readings from Last 3 Encounters:  04/27/16 168 lb 3.2 oz (76.295 kg)  04/24/16 166 lb 3.2 oz (75.388 kg)  03/15/16 168 lb (76.204 kg)    Physical Exam  Constitutional: She is well-developed, well-nourished, and in no distress.  HENT:  Head: Normocephalic and atraumatic.  Right Ear: External ear normal.  Left Ear: External ear normal.  Cardiovascular: Normal rate, regular rhythm and normal heart sounds.   Pulmonary/Chest: Effort normal and breath sounds normal.  Musculoskeletal:  No midline spine pain no midline spine step-off, no muscular back tenderness  Neurological: She is alert.  5/5 strength in bilateral biceps, triceps, grip, quads, hamstrings, plantar and dorsiflexion, sensation to light touch intact in bilateral UE and LE, normal gait, 2+ patellar reflexes  Skin: Skin is warm and dry. She is not diaphoretic.  Very rare scattered excoriated papules  Psychiatric:  Mood depressed and anxious, affect mildly anxious     Assessment/Plan: Please see individual problem list.  Anxiety and depression Chronic issue. Anxiety worsening. Continues with depression. Feels foggy. Wants to decrease Cymbalta to 20 mg. Continue  Ativan. We'll refer to a different psychiatrist for further medication management. She is given return precautions.  Chronic pain associated with significant psychosocial dysfunction Patient with chronic pain. Seeing physiatry and plans to have injections soon. The plan at this time is to await to see how she responds to the injections and if she still requires pain medicine at that time she'll be referred to a pain clinic. I provided her with a short-term refill of  Vicodin and warned against the risks of taking Vicodin and benzodiazepines together. She acknowledged this. She'll continue to monitor. She's given return precautions.  Skin lesions Refer to dermatology.    Orders Placed This Encounter  Procedures  . Ambulatory referral to Dermatology    Referral Priority:  Routine    Referral Type:  Consultation    Referral Reason:  Specialty Services Required    Requested Specialty:  Dermatology    Number of Visits Requested:  1  . Ambulatory referral to Psychiatry    Referral Priority:  Routine    Referral Type:  Psychiatric    Referral Reason:  Specialty Services Required    Requested Specialty:  Psychiatry    Number of Visits Requested:  1    Meds ordered this encounter  Medications  . HYDROcodone-acetaminophen (NORCO/VICODIN) 5-325 MG tablet    Sig: Take 1 tablet by mouth every 6 (six) hours as needed for moderate pain.    Dispense:  30 tablet    Refill:  0  . DULoxetine (CYMBALTA) 20 MG capsule    Sig: Take 1 capsule (20 mg total) by mouth daily.    Dispense:  30 capsule    Refill:  [place], MD [place]

## 2016-04-27 NOTE — Assessment & Plan Note (Signed)
Refer to dermatology 

## 2016-04-27 NOTE — Assessment & Plan Note (Signed)
Chronic issue. Anxiety worsening. Continues with depression. Feels foggy. Wants to decrease Cymbalta to 20 mg. Continue Ativan. We'll refer to a different psychiatrist for further medication management. She is given return precautions.

## 2016-05-12 ENCOUNTER — Telehealth: Payer: Self-pay | Admitting: Family Medicine

## 2016-05-12 ENCOUNTER — Other Ambulatory Visit: Payer: Self-pay | Admitting: Family Medicine

## 2016-05-12 DIAGNOSIS — G894 Chronic pain syndrome: Secondary | ICD-10-CM

## 2016-05-12 MED ORDER — HYDROCODONE-ACETAMINOPHEN 5-325 MG PO TABS: 1.0000 | ORAL_TABLET | Freq: Four times a day (QID) | ORAL | Status: DC | PRN

## 2016-05-12 NOTE — Telephone Encounter (Signed)
Refill will be given. Future refills will need to come from pain management. Referral to pain management has been given.

## 2016-05-12 NOTE — Telephone Encounter (Signed)
Recommendations? Refill request sent .

## 2016-05-12 NOTE — Telephone Encounter (Signed)
Refilled 04/27/16 and last seen same day. Please advise?

## 2016-05-12 NOTE — Telephone Encounter (Signed)
Patient was advised of the refill and future refills to come from pain management.

## 2016-05-12 NOTE — Telephone Encounter (Signed)
Pt would like to be set up pain management and also she needs a refill for her HYDROcodone-acetaminophen (NORCO/VICODIN) 5-325 MG tablet.

## 2016-05-17 DIAGNOSIS — M7551 Bursitis of right shoulder: Secondary | ICD-10-CM | POA: Insufficient documentation

## 2016-05-24 ENCOUNTER — Other Ambulatory Visit: Payer: Self-pay | Admitting: Family Medicine

## 2016-05-24 MED ORDER — HYDROCODONE-ACETAMINOPHEN 5-325 MG PO TABS: 1.0000 | ORAL_TABLET | Freq: Four times a day (QID) | ORAL | Status: DC | PRN

## 2016-05-24 NOTE — Progress Notes (Signed)
Notified patient that RX will be up front for her to pick up 

## 2016-05-24 NOTE — Progress Notes (Signed)
Received message that patient needed refill on pain medication until she can get in to see the pain management clinic. Refill will be provided.

## 2016-05-30 ENCOUNTER — Other Ambulatory Visit (INDEPENDENT_AMBULATORY_CARE_PROVIDER_SITE_OTHER): Payer: BLUE CROSS/BLUE SHIELD

## 2016-05-30 ENCOUNTER — Telehealth: Payer: Self-pay

## 2016-05-30 ENCOUNTER — Ambulatory Visit: Payer: BLUE CROSS/BLUE SHIELD | Admitting: Family Medicine

## 2016-05-30 DIAGNOSIS — E785 Hyperlipidemia, unspecified: Secondary | ICD-10-CM

## 2016-05-30 LAB — COMPREHENSIVE METABOLIC PANEL
ALBUMIN: 4.6 g/dL (ref 3.5–5.2)
ALK PHOS: 75 U/L (ref 39–117)
ALT: 28 U/L (ref 0–35)
AST: 20 U/L (ref 0–37)
BUN: 14 mg/dL (ref 6–23)
CALCIUM: 10 mg/dL (ref 8.4–10.5)
CHLORIDE: 103 meq/L (ref 96–112)
CO2: 33 mEq/L — ABNORMAL HIGH (ref 19–32)
CREATININE: 0.79 mg/dL (ref 0.40–1.20)
GFR: 80.57 mL/min (ref >60.00)
Glucose, Bld: 87 mg/dL (ref 70–99)
Potassium: 4.6 mEq/L (ref 3.5–5.1)
Sodium: 141 mEq/L (ref 135–145)
TOTAL PROTEIN: 7.3 g/dL (ref 6.0–8.3)
Total Bilirubin: 0.4 mg/dL (ref 0.2–1.2)

## 2016-05-30 LAB — LIPID PANEL
CHOLESTEROL: 183 mg/dL (ref 0–200)
HDL: 64.3 mg/dL (ref >39.00)
LDL CALC: 98 mg/dL (ref 0–99)
NonHDL: 119.16
TRIGLYCERIDES: 107 mg/dL (ref 0.0–149.0)
Total CHOL/HDL Ratio: 3
VLDL: 21.4 mg/dL (ref 0.0–40.0)

## 2016-05-30 NOTE — Telephone Encounter (Signed)
Pt missed her appt with Dr. Caryl Bis this morning. Has make up appt Thursday. Pt had fasting labs drawn this morning while she was here. Pt thinks she was supposed to follow up on her cholesterol. Need orders placed. Thank you.

## 2016-05-30 NOTE — Telephone Encounter (Signed)
Can you place an order for a CMET and lipid panel? Thanks, Randall Hiss.

## 2016-05-30 NOTE — Telephone Encounter (Signed)
Labs ordered. Thank you

## 2016-05-31 DIAGNOSIS — M25559 Pain in unspecified hip: Secondary | ICD-10-CM

## 2016-05-31 DIAGNOSIS — G8929 Other chronic pain: Secondary | ICD-10-CM | POA: Insufficient documentation

## 2016-05-31 DIAGNOSIS — M7062 Trochanteric bursitis, left hip: Secondary | ICD-10-CM

## 2016-05-31 DIAGNOSIS — M7061 Trochanteric bursitis, right hip: Secondary | ICD-10-CM | POA: Insufficient documentation

## 2016-06-01 ENCOUNTER — Ambulatory Visit (INDEPENDENT_AMBULATORY_CARE_PROVIDER_SITE_OTHER): Payer: BLUE CROSS/BLUE SHIELD | Admitting: Family Medicine

## 2016-06-01 ENCOUNTER — Encounter: Payer: Self-pay | Admitting: Family Medicine

## 2016-06-01 DIAGNOSIS — F419 Anxiety disorder, unspecified: Secondary | ICD-10-CM

## 2016-06-01 DIAGNOSIS — F418 Other specified anxiety disorders: Secondary | ICD-10-CM | POA: Diagnosis not present

## 2016-06-01 DIAGNOSIS — F32A Depression, unspecified: Secondary | ICD-10-CM

## 2016-06-01 DIAGNOSIS — G894 Chronic pain syndrome: Secondary | ICD-10-CM | POA: Diagnosis not present

## 2016-06-01 DIAGNOSIS — E785 Hyperlipidemia, unspecified: Secondary | ICD-10-CM

## 2016-06-01 DIAGNOSIS — F329 Major depressive disorder, single episode, unspecified: Secondary | ICD-10-CM

## 2016-06-01 MED ORDER — HYDROCODONE-ACETAMINOPHEN 5-325 MG PO TABS: 1.0000 | ORAL_TABLET | Freq: Four times a day (QID) | ORAL | 0 refills | Status: DC | PRN

## 2016-06-01 NOTE — Assessment & Plan Note (Signed)
Depression is slightly worse. She'll continue Cymbalta and Ativan. She will keep her appointment with psychiatry next week. She is given return precautions.

## 2016-06-01 NOTE — Assessment & Plan Note (Signed)
Patient is awaiting appointment with pain clinic. Encouraged her to continue to follow-up on this. She'll continue to see physiatry for possible injections. Continue to see rheumatology for her arthritic issues. Continue Vicodin until pain management can evaluate her. Warned against taking Vicodin with her Ativan and that if this makes her drowsy she needs to let us know.

## 2016-06-01 NOTE — Patient Instructions (Signed)
Nice to see you. Please follow-up with the psychiatrist. Please continue to see the physiatrist. Please continue your simvastatin. If you develop thoughts of harming herself, worsening pain, or any new or changing symptoms please seek medical attention.

## 2016-06-01 NOTE — Progress Notes (Signed)
Pre visit review using our clinic review tool, if applicable. No additional management support is needed unless otherwise documented below in the visit note. 

## 2016-06-01 NOTE — Assessment & Plan Note (Signed)
Cholesterol significantly improved. Tolerating simvastatin. Continue to work on diet and continue simvastatin.

## 2016-06-01 NOTE — Progress Notes (Signed)
  Tommi Rumps, MD Phone: [phone]  Nicole Walter is a 55 y.o. female who presents today for follow up.  Anxiety/depression: Notes this is stable. Not much anxiety though her depression is slightly worse. Notes she will cry for no reason at times. Is using Cymbalta and Ativan. No SI. Has an appointment with psychiatry next week.  Patient notes chronic pain related to osteoarthritis and fibromyalgia. Got bursa injections in her bilateral hips by rheumatology and is unsure if these helped. She is going to see physiatry for potential shots in her neck for arthritic issues. Notes occasionally the discomfort in her neck will radiate down to her upper arm on the right. No numbness or weakness in her arms. Taking Vicodin for this. Has a referral placed for pain management already. She is additionally taking gabapentin and is unsure how helpful this is. She is going to discuss this further with physiatry when she sees them and determine if she wants to discontinue this.  HYPERLIPIDEMIA Symptoms Chest pain on exertion:  No   Leg claudication:   No Medications: Compliance- taking simvastatin Right upper quadrant pain- no  Muscle aches- no Has been working with a nutritionist on her diet. Recent cholesterol significantly improved.  PMH: Former smoker   ROS see history of present illness  Objective  Physical Exam Vitals:   06/01/16 0952  BP: 110/68  Pulse: 71  Temp: 98.4 F (36.9 C)    BP Readings from Last 3 Encounters:  06/01/16 110/68  04/27/16 117/77  03/15/16 102/68   Wt Readings from Last 3 Encounters:  06/01/16 164 lb 12.8 oz (74.8 kg)  04/27/16 168 lb 3.2 oz (76.3 kg)  04/24/16 166 lb 3.2 oz (75.4 kg)    Physical Exam  Constitutional: No distress.  HENT:  Head: Normocephalic and atraumatic.  Cardiovascular: Normal rate, regular rhythm and normal heart sounds.   Pulmonary/Chest: Effort normal and breath sounds normal.  Musculoskeletal: She exhibits no edema.  No  midline spine tenderness, no midline spine step-off, paraspinous muscular tenderness in the thoracic region  Neurological: She is alert.  5/5 strength in bilateral biceps, triceps, grip, quads, hamstrings, plantar and dorsiflexion, sensation to light touch intact in bilateral UE and LE, normal gait, 2+ patellar reflexes  Skin: She is not diaphoretic.  Psychiatric:  Mood depressed, affect mildly flat     Assessment/Plan: Please see individual problem list.  Chronic pain associated with significant psychosocial dysfunction Patient is awaiting appointment with pain clinic. Encouraged her to continue to follow-up on this. She'll continue to see physiatry for possible injections. Continue to see rheumatology for her arthritic issues. Continue Vicodin until pain management can evaluate her. Warned against taking Vicodin with her Ativan and that if this makes her drowsy she needs to let us know.  Anxiety and depression Depression is slightly worse. She'll continue Cymbalta and Ativan. She will keep her appointment with psychiatry next week. She is given return precautions.  Hyperlipidemia Cholesterol significantly improved. Tolerating simvastatin. Continue to work on diet and continue simvastatin.   No orders of the defined types were placed in this encounter.   Tommi Rumps, MD [place]

## 2016-06-08 ENCOUNTER — Telehealth: Payer: Self-pay | Admitting: Family Medicine

## 2016-06-08 MED ORDER — HYDROCODONE-ACETAMINOPHEN 5-325 MG PO TABS: 1.0000 | ORAL_TABLET | Freq: Four times a day (QID) | ORAL | 0 refills | Status: DC | PRN

## 2016-06-08 NOTE — Telephone Encounter (Signed)
Please advise as it looks like this was filled on the 27th at her appt, but not in the notes?

## 2016-06-08 NOTE — Telephone Encounter (Signed)
LM for patient that RX will be up front for patient to pick up

## 2016-06-08 NOTE — Telephone Encounter (Signed)
This was refilled last week. Please check with the patient to see about this refill request and see how often she is taking this medication. Thanks.

## 2016-06-08 NOTE — Telephone Encounter (Signed)
Spoke with the patient, she has been taking 1 pill four times a day.  She did over do it over the weekend as she drove her mother to [facility], so it took a toll on her shoulder.  She is also going to be traveling again on the 6th-12th.  Please advise, thanks

## 2016-06-08 NOTE — Telephone Encounter (Signed)
Refill will be given. Please check to see when she gets in to see the pain management clinic.

## 2016-06-08 NOTE — Telephone Encounter (Signed)
Pt is requesting a refill on her HYDROcodone-acetaminophen (NORCO/VICODIN) 5-325 MG tablet. Please advise pt.

## 2016-06-08 NOTE — Telephone Encounter (Signed)
Patient has not had a response for pain management, She's aware Rx being ready for pick up

## 2016-06-16 ENCOUNTER — Telehealth: Payer: Self-pay | Admitting: *Deleted

## 2016-06-16 MED ORDER — HYDROCODONE-ACETAMINOPHEN 5-325 MG PO TABS
1.0000 | ORAL_TABLET | Freq: Four times a day (QID) | ORAL | 0 refills | Status: DC | PRN
Start: 2016-06-16 — End: 2016-07-05

## 2016-06-16 NOTE — Telephone Encounter (Signed)
Refill provided. Please place at front for pick up.

## 2016-06-16 NOTE — Telephone Encounter (Signed)
Refill request for Hydrocondone, last seen [id number], last filled [id number].  Please advise.

## 2016-06-16 NOTE — Telephone Encounter (Signed)
Patient called and states she got an appt with pain management on 07/20/16 and she is out of her medication .She was wondering if Dr. Caryl Bis can write her a refill for hydrocodone until her appt..Patient states you can call her if you have any questions .  Patient number is [phone].

## 2016-06-16 NOTE — Telephone Encounter (Signed)
Left a detailed message that it is ready to be picked up. thanks

## 2016-06-29 ENCOUNTER — Other Ambulatory Visit: Payer: Self-pay | Admitting: Physical Medicine and Rehabilitation

## 2016-06-29 DIAGNOSIS — M5414 Radiculopathy, thoracic region: Secondary | ICD-10-CM

## 2016-07-05 ENCOUNTER — Encounter: Payer: Self-pay | Admitting: Family Medicine

## 2016-07-05 ENCOUNTER — Ambulatory Visit (INDEPENDENT_AMBULATORY_CARE_PROVIDER_SITE_OTHER): Payer: BLUE CROSS/BLUE SHIELD | Admitting: Family Medicine

## 2016-07-05 ENCOUNTER — Emergency Department: Payer: BLUE CROSS/BLUE SHIELD

## 2016-07-05 ENCOUNTER — Encounter: Payer: Self-pay | Admitting: Emergency Medicine

## 2016-07-05 ENCOUNTER — Encounter (INDEPENDENT_AMBULATORY_CARE_PROVIDER_SITE_OTHER): Payer: Self-pay

## 2016-07-05 ENCOUNTER — Emergency Department
Admission: EM | Admit: 2016-07-05 | Discharge: 2016-07-05 | Disposition: A | Discharge status: Home / Self Care | Payer: BLUE CROSS/BLUE SHIELD | Attending: Emergency Medicine | Admitting: Emergency Medicine

## 2016-07-05 VITALS — BP 108/70 | HR 63 | Temp 98.2°F | Wt 174.6 lb

## 2016-07-05 DIAGNOSIS — Z79899 Other long term (current) drug therapy: Secondary | ICD-10-CM | POA: Insufficient documentation

## 2016-07-05 DIAGNOSIS — F419 Anxiety disorder, unspecified: Secondary | ICD-10-CM

## 2016-07-05 DIAGNOSIS — F418 Other specified anxiety disorders: Secondary | ICD-10-CM

## 2016-07-05 DIAGNOSIS — R1031 Right lower quadrant pain: Secondary | ICD-10-CM

## 2016-07-05 DIAGNOSIS — G8929 Other chronic pain: Secondary | ICD-10-CM | POA: Diagnosis not present

## 2016-07-05 DIAGNOSIS — R079 Chest pain, unspecified: Secondary | ICD-10-CM | POA: Insufficient documentation

## 2016-07-05 DIAGNOSIS — F32A Depression, unspecified: Secondary | ICD-10-CM

## 2016-07-05 DIAGNOSIS — G894 Chronic pain syndrome: Secondary | ICD-10-CM | POA: Diagnosis not present

## 2016-07-05 DIAGNOSIS — Z87891 Personal history of nicotine dependence: Secondary | ICD-10-CM | POA: Insufficient documentation

## 2016-07-05 DIAGNOSIS — R109 Unspecified abdominal pain: Secondary | ICD-10-CM

## 2016-07-05 DIAGNOSIS — E876 Hypokalemia: Secondary | ICD-10-CM | POA: Diagnosis not present

## 2016-07-05 DIAGNOSIS — F329 Major depressive disorder, single episode, unspecified: Secondary | ICD-10-CM

## 2016-07-05 DIAGNOSIS — M549 Dorsalgia, unspecified: Secondary | ICD-10-CM | POA: Insufficient documentation

## 2016-07-05 DIAGNOSIS — R001 Bradycardia, unspecified: Secondary | ICD-10-CM | POA: Insufficient documentation

## 2016-07-05 HISTORY — DX: Chest pain, unspecified: R07.9

## 2016-07-05 LAB — CBC
HEMATOCRIT: 34.6 % — AB (ref 35.0–47.0)
Hemoglobin: 11.9 g/dL — ABNORMAL LOW (ref 12.0–16.0)
MCH: 31 pg (ref 26.0–34.0)
MCHC: 34.5 g/dL (ref 32.0–36.0)
MCV: 89.8 fL (ref 80.0–100.0)
PLATELETS: 256 10*3/uL (ref 150–440)
RBC: 3.85 MIL/uL (ref 3.80–5.20)
RDW: 13.6 % (ref 11.5–14.5)
WBC: 8.9 10*3/uL (ref 3.6–11.0)

## 2016-07-05 LAB — TROPONIN I: Troponin I: <0.03 ng/mL (ref <0.03)

## 2016-07-05 LAB — BASIC METABOLIC PANEL
Anion gap: 6 (ref 5–15)
BUN: 16 mg/dL (ref 6–20)
CALCIUM: 8.7 mg/dL — AB (ref 8.9–10.3)
CO2: 26 mmol/L (ref 22–32)
Chloride: 106 mmol/L (ref 101–111)
Creatinine, Ser: 0.69 mg/dL (ref 0.44–1.00)
GFR calc Af Amer: >60 mL/min (ref >60)
GLUCOSE: 106 mg/dL — AB (ref 65–99)
Potassium: 3.4 mmol/L — ABNORMAL LOW (ref 3.5–5.1)
Sodium: 138 mmol/L (ref 135–145)

## 2016-07-05 MED ORDER — POTASSIUM CHLORIDE CRYS ER 20 MEQ PO TBCR
40.0000 meq | EXTENDED_RELEASE_TABLET | Freq: Once | ORAL | Status: AC
  Administered 2016-07-05: 40 meq via ORAL
  Filled 2016-07-05 (×2): qty 2

## 2016-07-05 MED ORDER — HYDROCODONE-ACETAMINOPHEN 5-325 MG PO TABS: 1.0000 | ORAL_TABLET | Freq: Four times a day (QID) | ORAL | 0 refills | Status: DC | PRN

## 2016-07-05 NOTE — ED Provider Notes (Signed)
[facility] Emergency Department Provider Note  ____________________________________________  Time seen: Approximately 7:52 PM  I have reviewed the triage vital signs and the nursing notes.   HISTORY  Chief Complaint Chest Pain    HPI Nicole Walter is a 55 y.o. female with a history of chronic abdominal pain, anxiety and depression, and hyperlipidemia presenting with abdominal pain, chest pain. The patient reports that she has had a chronic abdominal pain for 8 months. She has been evaluated at the GI physician, Dr. Anastasio Champion, with negative endoscopy and benign polyp removal and colonoscopy. She has also had a normal gastric emptying study. She was seen here 2/17 for the same with a CT scan of the abdomen which did not show any acute cause for her pain. Over the past several days, the patient reports that her pain has been similar in character but just she has noticed increased distention compared to usual. She has had no nausea or vomiting, no fever or chills, and her bowel movements have been normal with the last bowel movement this morning. However, she went to her primary care physician's office today, and when he pushed on her abdomen, she developed a severe sharp pain that radiated up under both breasts, up into the upper chest, and the neck. She did not have any associated shortness of breath, palpitations, lightheadedness or syncope. At this point, her pain is back to baseline. The patient also notes that she has recently been transitioning from [place], on which her anxiety and depression were well controlled since 1993, to [place], and that she feels that her anxiety is not tolerating this change well.   Past Medical History:  Diagnosis Date  . Anxiety   . Chronic pain   . Depression   . DJD (degenerative joint disease)    lumbar  . Fibromyalgia   . High cholesterol     Patient Active Problem List   Diagnosis Date Noted  . Chest pain 07/05/2016  .  Abdominal pain 07/05/2016  . Skin lesions 04/27/2016  . Hyperlipidemia 02/18/2016  . Major depressive disorder, recurrent episode, moderate ([place]) 02/14/2016  . Cervical osteoarthritis 02/08/2016  . Abnormal liver enzymes 02/08/2016  . Fibromyalgia 02/08/2016  . Osteopenia 02/08/2016  . Rash of back 01/28/2016  . Thoracic back pain 01/28/2016  . Cervical pain 01/28/2016  . Concentration deficit 01/28/2016  . Degenerative arthritis of lumbar spine 01/06/2016  . Generalized abdominal pain 01/06/2016  . Anxiety and depression 07/11/2015  . Chronic pain associated with significant psychosocial dysfunction 07/11/2015  . Combined fat and carbohydrate induced hyperlipemia 07/11/2015    Past Surgical History:  Procedure Laterality Date  . ANKLE SURGERY     x 2  . CHOLECYSTECTOMY    . COLONOSCOPY    . COLONOSCOPY WITH PROPOFOL N/A 02/22/2016   Procedure: COLONOSCOPY WITH PROPOFOL;  Surgeon: Lollie Sails, MD;  Location: [facility] ENDOSCOPY;  Service: Endoscopy;  Laterality: N/A;  . ESOPHAGOGASTRODUODENOSCOPY (EGD) WITH PROPOFOL N/A 02/22/2016   Procedure: ESOPHAGOGASTRODUODENOSCOPY (EGD) WITH PROPOFOL;  Surgeon: Lollie Sails, MD;  Location: [facility] ENDOSCOPY;  Service: Endoscopy;  Laterality: N/A;  . NASAL SINUS SURGERY    . TONSILLECTOMY    . TUBAL LIGATION      Current Outpatient Rx  . Order #: [id number] Class: Historical Med  . Order #: [id number] Class: Historical Med  . Order #: [id number] Class: Historical Med  . Order #: [id number] Class: Historical Med  . Order #: [id number] Class: Historical Med  . Order #: [id number] Class: Normal  . Order #:  [id number] Class: Historical Med  . Order #: [id number] Class: Print  . Order #: [id number] Class: Historical Med  . Order #: [id number] Class: Historical Med  . Order #: [id number] Class: Historical Med  . Order #: [id number] Class: Normal  . Order #: [id number] Class: Historical Med    Allergies Food and Other  Family History  Problem Relation Age  of Onset  . Heart disease Mother   . Stroke Mother   . Cancer Father     lung  . Arthritis Father   . Arthritis Paternal Grandmother   . Arthritis Paternal Grandfather   . Drug abuse Sister   . Drug abuse Brother   . Post-traumatic stress disorder Brother   . Diabetes Sister   . Dementia Sister   . Drug abuse Brother   . Cancer Brother     Social History Social History  Substance Use Topics  . Smoking status: Former Smoker    Start date: 03/06/1974    Quit date: 07/08/2015  . Smokeless tobacco: Never Used  . Alcohol use No    Review of Systems Constitutional: No fever/chills.No lightheadedness or syncope. Eyes: No visual changes. ENT: No sore throat. No congestion or rhinorrhea. Cardiovascular: Positive chest pain. Denies palpitations. Respiratory: Denies shortness of breath.  No cough. Gastrointestinal: Positive abdominal pain.  No nausea, no vomiting.  No diarrhea.  No constipation. Genitourinary: Negative for dysuria. Musculoskeletal: Positive for back pain. Skin: Negative for rash. Neurological: Negative for headaches. No focal numbness, tingling or weakness.  Psychiatric:Positive increasing anxiety.  10-point ROS otherwise negative.  ____________________________________________   PHYSICAL EXAM:  VITAL SIGNS: ED Triage Vitals  Enc Vitals Group     BP 07/05/16 1603 136/74     Pulse Rate 07/05/16 1603 (!) 54     Resp 07/05/16 1603 18     Temp 07/05/16 1603 97.7 F (36.5 C)     Temp Source 07/05/16 1603 Oral     SpO2 07/05/16 1603 98 %     Weight 07/05/16 1601 174 lb (78.9 kg)     Height 07/05/16 1601 5\' 4"  (1.626 m)     Head Circumference --      Peak Flow --      Pain Score 07/05/16 1602 0     Pain Loc --      Pain Edu? --      Excl. in [place]? --     Constitutional: Alert and oriented. Well appearing and in no acute distress. Answers questions appropriately. Eyes: Conjunctivae are normal.  EOMI. No scleral icterus. Head: Atraumatic. Nose: No  congestion/rhinnorhea. Mouth/Throat: Mucous membranes are moist.  Neck: No stridor.  Supple.   Cardiovascular: Normal rate, regular rhythm. No murmurs, rubs or gallops.  Respiratory: Normal respiratory effort.  No accessory muscle use or retractions. Lungs CTAB.  No wheezes, rales or ronchi. Gastrointestinal: Soft and Mild distended. Mild diffuse tenderness to palpation without focality..  No guarding or rebound.  No peritoneal signs. Musculoskeletal: No LE edema. No ttp in the calves or palpable cords.  Negative Homan's sign. Neurologic:  A&Ox3.  Speech is clear.  Face and smile are symmetric.  EOMI.  Moves all extremities well. Skin:  Skin is warm, dry and intact. No rash noted. Psychiatric: Mood and affect are normal. Speech and behavior are normal.  Normal judgement.  ____________________________________________   LABS (all labs ordered are listed, but only abnormal results are displayed)  Labs Reviewed  BASIC METABOLIC PANEL - Abnormal; Notable for the following:       Result Value  Potassium 3.4 (*)    Glucose, Bld 106 (*)    Calcium 8.7 (*)    All other components within normal limits  CBC - Abnormal; Notable for the following:    Hemoglobin 11.9 (*)    HCT 34.6 (*)    All other components within normal limits  TROPONIN I   ____________________________________________  EKG  ED ECG REPORT I, Eula Listen, the attending physician, personally viewed and interpreted this ECG.   Date: 07/05/2016  EKG Time: 1602  Rate: 52  Rhythm: sinus bradycardia  Axis: normal  Intervals:none  ST&T Change: Nonspecific T-wave inversions in V1 and V2. No ST elevation. No ischemic changes.  ____________________________________________  [id number]  Dg Chest 2 View  Result Date: 07/05/2016 CLINICAL DATA:  Two day history of chest pain EXAM: CHEST  2 VIEW COMPARISON:  December 20, 2015 FINDINGS: There is no appreciable edema or consolidation. Heart size and pulmonary  vascularity are normal. No adenopathy. No pneumothorax. There is mild degenerative change in the thoracic spine. IMPRESSION: No edema or consolidation. Electronically Signed   By: Lowella Grip III M.D.   On: 07/05/2016 16:26    ____________________________________________   PROCEDURES  Procedure(s) performed: None  Procedures  Critical Care performed: No ____________________________________________   INITIAL IMPRESSION / ASSESSMENT AND PLAN / ED COURSE  Pertinent labs & imaging results that were available during my care of the patient were reviewed by me and considered in my medical decision making (see chart for details).  55 y.o. female with a history of chronic abdominal pain presenting with increased distention and the onset of chest pain when her primary care physician [facility] her abdomen. The patient is hemodynamically stable and afebrile. She does not have any red flag symptoms including fever, chills, nausea vomiting or diarrhea. Her abdominal pain is slightly worse in severity but similar in character to her chronic pain, and it is unlikely that she has a new process today. Her white blood cell count is normal, and there is no focality on my examination that would be suggestive of appendicitis, diverticulitis, or other complicated intra-abdominal infection or pathology. The patient's chest pain workup has also been reassuring. She has EKG which does not show ischemic changes, chest x-ray that shows no acute cardiopulmonary process, and a negative troponin. This time, the patient's pain is back to baseline, and I'll plan to discharge her home with close PMD follow-up. She did note that her PMD had suggested that she may need a CT scan, but at this time, emergency Department imaging is not warranted of the abdomen.  The patient under understands return precautions as well as follow-up instructions.  ____________________________________________  FINAL CLINICAL IMPRESSION(S) / ED  DIAGNOSES  Final diagnoses:  Chest pain, unspecified chest pain type  Chronic abdominal pain  Hypokalemia  Anxiety    Clinical Course      NEW MEDICATIONS STARTED DURING THIS VISIT:  New Prescriptions   No medications on file      Eula Listen, MD 07/05/16 1958

## 2016-07-05 NOTE — Assessment & Plan Note (Addendum)
Patient with abdominal pain worsened today. Mostly in the periumbilical and right lower quadrant and epigastric region. Quite tender on examination of right lower quadrant. No guarding or rebound. Concern with location would be for issue with her appendix. Discussed obtaining a CT scan though after she started to have chest pain advised ED evaluation and to have them evaluate her abdomen as well.

## 2016-07-05 NOTE — Assessment & Plan Note (Signed)
Chronic pain has been about stable. Vicodin not helping quite as much. Does have an appointment with pain management. Vicodin refilled. Advised to keep appointment with pain management.

## 2016-07-05 NOTE — ED Triage Notes (Signed)
Pt referred by [facility] after assesment of the physician pressing on her stomach radiatied pain went to her chest / squeezing / tightness in her throat. Pt on Buspar "I am coming off of ativan. Pt received a "aspirin" before leaving.

## 2016-07-05 NOTE — ED Notes (Signed)
Pt reports was seen today at PCP office for reports of ongoing abdominal pain. Pt reports her PCP pushed on her abdomen and it was tender, then when she sat up she had squeezing pains up into her chest and neck. Pt states it felt like gas. Pt reports she is also currently tapering her ativan that she has been on since 1993. Pt states she thinks the pain may be due to anxiety. Pt reports chronic back issues and takes hydrocodone for it but has not had good pain control today.

## 2016-07-05 NOTE — Discharge Instructions (Signed)
Please continue to follow up with your primary care physician for the evaluation and treatment of your chronic abdominal pain.  Please continue to take all your medications as prescribed.  Return to the emergency department for new or worsening pain, fever, inability to keep down fluids, or any other symptoms concerning to you.

## 2016-07-05 NOTE — Progress Notes (Signed)
Pre visit review using our clinic review tool, if applicable. No additional management support is needed unless otherwise documented below in the visit note. 

## 2016-07-05 NOTE — Assessment & Plan Note (Signed)
Significant anxiety with coming off of her Ativan. This may be playing a role in her current abdominal issues and chest pain. Suggested she keep her follow-up appointment with the psychiatrist on Friday. Continue current medications.

## 2016-07-05 NOTE — Assessment & Plan Note (Signed)
Patient with onset of chest pain in the office after physical exam. Some typical components to it. She does have risk factors. Strong family history for cardiac disease. EKG is reassuring with only T-wave inversion in lead 3 that is a change from previously. Could be cardiac in nature. Could be GI in nature. Could be related to anxiety. Given her family history and her risk factors she warrants emergency room evaluation for this. I advised EMS transport though she opted against this despite the risks of transferring herself and filled out [employer] paperwork and will transport herself. CMA will contact the charge nurse to inform them patient was on her way.

## 2016-07-05 NOTE — Progress Notes (Signed)
Tommi Rumps, MD Phone: [phone]  Nicole Walter is a 55 y.o. female who presents today for follow-up.  Chronic pain: Patient notes Vicodin is not helping as much. She notes her hip pain is better with the bursa injections. Notes her back pain is better with her epidural injection. No sciatica now. No saddle anesthesia, loss of bowel or bladder function, or fevers. Start physical therapy next month. Has an appointment with pain management next month. Notes her physiatrist wanted to hold off on giving further shots and to have them done through pain management.  Abdominal pain/bloating: Patient notes she is continuing to have issues with abdominal discomfort. Mostly in her epigastric region though sometimes in her right lower quadrant. Notes she has had significant workup with upper endoscopy and colonoscopy. Also had CT scan in February that was unremarkable for cause. No fevers. Notes her stools are looser than normal. Notes the pain is worsened today.  She notes her anxiety is increased. Her psychiatrist is tapering her off of Ativan. She is on BuSpar now. Notes significant anxiety.  After discussing these things with the patient and getting ready to exit the room the patient noted she did not feel well and felt as though she had chest tightness radiating up to her neck. She noted some shortness of breath with this. No diaphoresis. She notes no history of cardiac disease. She does have a history of hyperlipidemia. She is a former smoker. She notes a significant family history of her father and uncles having cardiac disease in their 36s. After the EKG was done she noted that her symptoms had improved some though were still present.  PMH: Former smoker   ROS see history of present illness  Objective  Physical Exam Vitals:   07/05/16 1458  BP: 108/70  Pulse: 63  Temp: 98.2 F (36.8 C)    BP Readings from Last 3 Encounters:  07/05/16 108/70  06/01/16 110/68  04/27/16 117/77    Wt Readings from Last 3 Encounters:  07/05/16 174 lb 9.6 oz (79.2 kg)  06/01/16 164 lb 12.8 oz (74.8 kg)  04/27/16 168 lb 3.2 oz (76.3 kg)    Physical Exam  Constitutional: No distress.  HENT:  Head: Normocephalic and atraumatic.  Cardiovascular: Normal rate, regular rhythm and normal heart sounds.   Pulmonary/Chest: Effort normal and breath sounds normal.  Abdominal: Soft. Bowel sounds are normal. She exhibits no distension. There is tenderness (epigastric, periumbilical, and right lower quadrant). There is no rebound and no guarding.  Musculoskeletal: She exhibits no edema.  No midline spine tenderness, no midline spine step-off, no muscular back tenderness  Neurological: She is alert. Gait normal.  5 out of 5 strength bilateral quads, hamstrings, plantar flexion, and dorsiflexion, sensation light touch intact bilateral lower extremities, 2+ patellar reflexes  Skin: Skin is warm and dry. She is not diaphoretic.  Psychiatric:  Anxious   EKG: Normal sinus rhythm, rate 58, RSR prime in V1, inverted T wave in lead 3  Assessment/Plan: Please see individual problem list.  Chest pain Patient with onset of chest pain in the office after physical exam. Some typical components to it. She does have risk factors. Strong family history for cardiac disease. EKG is reassuring with only T-wave inversion in lead 3 that is a change from previously. Could be cardiac in nature. Could be GI in nature. Could be related to anxiety. Given her family history and her risk factors she warrants emergency room evaluation for this. I advised EMS transport though she  opted against this despite the risks of transferring herself and filled out [place] paperwork and will transport herself. CMA will contact the charge nurse to inform them patient was on her way.   Abdominal pain Patient with abdominal pain worsened today. Mostly in the periumbilical and right lower quadrant and epigastric region. Quite tender on  examination of right lower quadrant. No guarding or rebound. Concern with location would be for issue with her appendix. Discussed obtaining a CT scan though after she started to have chest pain advised ED evaluation and to have them evaluate her abdomen as well.  Chronic pain associated with significant psychosocial dysfunction Chronic pain has been about stable. Vicodin not helping quite as much. Does have an appointment with pain management. Vicodin refilled. Advised to keep appointment with pain management.  Anxiety and depression Significant anxiety with coming off of her Ativan. This may be playing a role in her current abdominal issues and chest pain. Suggested she keep her follow-up appointment with the psychiatrist on Friday. Continue current medications.   Orders Placed This Encounter  Procedures  . EKG 12-Lead   Tommi Rumps, MD [place]

## 2016-07-05 NOTE — Patient Instructions (Addendum)
Please go to the ED immediately for further evaluation of your chest pain and abdominal discomfort. If you have any worsening symptoms in route stop and call 911.

## 2016-07-11 ENCOUNTER — Ambulatory Visit
Admission: RE | Admit: 2016-07-11 | Discharge: 2016-07-11 | Disposition: A | Discharge status: Home / Self Care | Payer: BLUE CROSS/BLUE SHIELD | Source: Ambulatory Visit | Attending: Physical Medicine and Rehabilitation | Admitting: Physical Medicine and Rehabilitation

## 2016-07-11 DIAGNOSIS — M5114 Intervertebral disc disorders with radiculopathy, thoracic region: Secondary | ICD-10-CM | POA: Insufficient documentation

## 2016-07-11 DIAGNOSIS — M4804 Spinal stenosis, thoracic region: Secondary | ICD-10-CM | POA: Diagnosis not present

## 2016-07-11 DIAGNOSIS — M4184 Other forms of scoliosis, thoracic region: Secondary | ICD-10-CM | POA: Diagnosis not present

## 2016-07-11 DIAGNOSIS — M5134 Other intervertebral disc degeneration, thoracic region: Secondary | ICD-10-CM | POA: Insufficient documentation

## 2016-07-11 DIAGNOSIS — M5414 Radiculopathy, thoracic region: Secondary | ICD-10-CM | POA: Diagnosis present

## 2016-07-12 ENCOUNTER — Encounter: Payer: Self-pay | Admitting: Physical Therapy

## 2016-07-12 ENCOUNTER — Ambulatory Visit: Payer: BLUE CROSS/BLUE SHIELD | Attending: Internal Medicine | Admitting: Physical Therapy

## 2016-07-12 DIAGNOSIS — M545 Low back pain, unspecified: Secondary | ICD-10-CM

## 2016-07-12 DIAGNOSIS — R262 Difficulty in walking, not elsewhere classified: Secondary | ICD-10-CM | POA: Insufficient documentation
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
## 2016-07-14 ENCOUNTER — Encounter: Payer: Self-pay | Admitting: Family Medicine

## 2016-07-14 ENCOUNTER — Ambulatory Visit (INDEPENDENT_AMBULATORY_CARE_PROVIDER_SITE_OTHER): Payer: BLUE CROSS/BLUE SHIELD | Admitting: Family Medicine

## 2016-07-14 DIAGNOSIS — R1084 Generalized abdominal pain: Secondary | ICD-10-CM | POA: Diagnosis not present

## 2016-07-14 DIAGNOSIS — R079 Chest pain, unspecified: Secondary | ICD-10-CM | POA: Diagnosis not present

## 2016-07-14 DIAGNOSIS — F418 Other specified anxiety disorders: Secondary | ICD-10-CM

## 2016-07-14 DIAGNOSIS — F419 Anxiety disorder, unspecified: Secondary | ICD-10-CM

## 2016-07-14 DIAGNOSIS — Z23 Encounter for immunization: Secondary | ICD-10-CM | POA: Diagnosis not present

## 2016-07-14 DIAGNOSIS — F32A Depression, unspecified: Secondary | ICD-10-CM

## 2016-07-14 DIAGNOSIS — F329 Major depressive disorder, single episode, unspecified: Secondary | ICD-10-CM

## 2016-07-14 NOTE — Assessment & Plan Note (Signed)
I encouraged her to follow-up with her psychiatrist as scheduled today.

## 2016-07-14 NOTE — Assessment & Plan Note (Signed)
No recurrence since ED evaluation. Had reassuring workup in the ED. Suspect anxiety related and possibly related to gas buildup. She'll continue to monitor and if recurs she'll seek medical attention.

## 2016-07-14 NOTE — Progress Notes (Signed)
Pre visit review using our clinic review tool, if applicable. No additional management support is needed unless otherwise documented below in the visit note. 

## 2016-07-14 NOTE — Patient Instructions (Signed)
Nice to see you. Please follow up with your counselor and psychiatrist today as scheduled. Please discuss further medicine for anxiety and depression. Please continue the AcipHex. I would suggest trying to identify foods that cause you to accumulate gas. You should cut these out as well. Will also provide you with a outline of foods that can be gas producing. If you develop abdominal pain, worsening anxiety, worsening depression, chest pain, shortness of breath, also harming herself, or any new or changing symptoms please seek medical attention.

## 2016-07-14 NOTE — Assessment & Plan Note (Signed)
No recurrence of abdominal pain though has had bloating and significant gas. Had negative workup in the ED with lab work and cardiac workup. Benign abdominal exam today. She'll continue AcipHex. I gave her a fodmap diet and instructed her on eliminating foods on this list and adding back as tolerated if her symptoms improved. Given return precautions.

## 2016-07-14 NOTE — Progress Notes (Signed)
  Tommi Rumps, MD Phone: [phone]  Nicole Walter is a 55 y.o. female who presents today for follow-up.  Patient seen about a week ago in the office for abdominal discomfort and then subsequently develop chest pain. She was evaluated in the emergency room for this. Workup was negative for cardiac cause of chest pain. EKG overall reassuring. Troponin negative. Potassium was minimally low at 3.4 and hemoglobin was minimally low at 11.9. Notes she has not had any recurrent chest pain. Notes her abdomen is felt better. Intermittently gets bloated and feels gassy. Has been doing AcipHex twice daily. Has had an EGD that revealed gastritis. On review of GI note it appears that they think possibly this could be related to gastroparesis. She notes Carafate does help with this. Bowel movements daily. Lots of gas. She has not identified any particular foods that bring her symptoms on.  Patient notes her anxiety is slightly worse since tapering off of the Ativan. She's been completely off of it 4-5 days. Not sleeping that well. Notes anxiety and depression overall are better than they were before though not great. Is on BuSpar. No SI. She is seeing her therapist and psychiatrist later today.  PMH: Former smoker.   ROS see history of present illness  Objective  Physical Exam Vitals:   07/14/16 0803  BP: 106/68  Pulse: 67  Temp: 97.8 F (36.6 C)    BP Readings from Last 3 Encounters:  07/14/16 106/68  07/05/16 133/74  07/05/16 108/70   Wt Readings from Last 3 Encounters:  07/14/16 171 lb 3.2 oz (77.7 kg)  07/05/16 174 lb (78.9 kg)  07/05/16 174 lb 9.6 oz (79.2 kg)    Physical Exam  Constitutional: No distress.  Cardiovascular: Normal rate, regular rhythm and normal heart sounds.   Pulmonary/Chest: Effort normal and breath sounds normal.  Abdominal: Soft. Bowel sounds are normal. She exhibits no distension. There is no tenderness. There is no rebound and no guarding.    Musculoskeletal: She exhibits no edema.  Neurological: She is alert. Gait normal.  Skin: Skin is warm and dry. She is not diaphoretic.     Assessment/Plan: Please see individual problem list.  Anxiety and depression I encouraged her to follow-up with her psychiatrist as scheduled today.  Abdominal pain No recurrence of abdominal pain though has had bloating and significant gas. Had negative workup in the ED with lab work and cardiac workup. Benign abdominal exam today. She'll continue AcipHex. I gave her a fodmap diet and instructed her on eliminating foods on this list and adding back as tolerated if her symptoms improved. Given return precautions.  Chest pain No recurrence since ED evaluation. Had reassuring workup in the ED. Suspect anxiety related and possibly related to gas buildup. She'll continue to monitor and if recurs she'll seek medical attention.   Orders Placed This Encounter  Procedures  . Flu Vaccine QUAD 36+ mos IM    Tommi Rumps, MD [place]

## 2016-07-18 ENCOUNTER — Ambulatory Visit: Payer: BLUE CROSS/BLUE SHIELD | Admitting: Physical Therapy

## 2016-07-18 DIAGNOSIS — M545 Low back pain, unspecified: Secondary | ICD-10-CM

## 2016-07-18 DIAGNOSIS — R262 Difficulty in walking, not elsewhere classified: Secondary | ICD-10-CM | POA: Diagnosis not present

## 2016-07-20 ENCOUNTER — Ambulatory Visit: Payer: BLUE CROSS/BLUE SHIELD | Attending: Pain Medicine | Admitting: Pain Medicine

## 2016-07-20 ENCOUNTER — Other Ambulatory Visit
Admission: RE | Admit: 2016-07-20 | Discharge: 2016-07-20 | Disposition: A | Discharge status: Home / Self Care | Payer: BLUE CROSS/BLUE SHIELD | Source: Ambulatory Visit | Attending: Pain Medicine | Admitting: Pain Medicine

## 2016-07-20 ENCOUNTER — Ambulatory Visit: Payer: BLUE CROSS/BLUE SHIELD | Admitting: Physical Therapy

## 2016-07-20 ENCOUNTER — Encounter: Payer: Self-pay | Admitting: Pain Medicine

## 2016-07-20 VITALS — BP 121/67 | HR 77 | Temp 97.7°F | Resp 16 | Ht 64.0 in | Wt 172.0 lb

## 2016-07-20 DIAGNOSIS — M7551 Bursitis of right shoulder: Secondary | ICD-10-CM | POA: Insufficient documentation

## 2016-07-20 DIAGNOSIS — M79605 Pain in left leg: Secondary | ICD-10-CM | POA: Diagnosis not present

## 2016-07-20 DIAGNOSIS — M8580 Other specified disorders of bone density and structure, unspecified site: Secondary | ICD-10-CM | POA: Insufficient documentation

## 2016-07-20 DIAGNOSIS — G8929 Other chronic pain: Secondary | ICD-10-CM | POA: Diagnosis not present

## 2016-07-20 DIAGNOSIS — M47812 Spondylosis without myelopathy or radiculopathy, cervical region: Secondary | ICD-10-CM | POA: Diagnosis not present

## 2016-07-20 DIAGNOSIS — M25559 Pain in unspecified hip: Secondary | ICD-10-CM

## 2016-07-20 DIAGNOSIS — Z79891 Long term (current) use of opiate analgesic: Secondary | ICD-10-CM | POA: Diagnosis not present

## 2016-07-20 DIAGNOSIS — M79606 Pain in leg, unspecified: Secondary | ICD-10-CM

## 2016-07-20 DIAGNOSIS — M797 Fibromyalgia: Secondary | ICD-10-CM

## 2016-07-20 DIAGNOSIS — Z833 Family history of diabetes mellitus: Secondary | ICD-10-CM | POA: Diagnosis not present

## 2016-07-20 DIAGNOSIS — M47816 Spondylosis without myelopathy or radiculopathy, lumbar region: Secondary | ICD-10-CM | POA: Diagnosis not present

## 2016-07-20 DIAGNOSIS — R1013 Epigastric pain: Secondary | ICD-10-CM

## 2016-07-20 DIAGNOSIS — M79604 Pain in right leg: Secondary | ICD-10-CM | POA: Insufficient documentation

## 2016-07-20 DIAGNOSIS — M25519 Pain in unspecified shoulder: Secondary | ICD-10-CM

## 2016-07-20 DIAGNOSIS — M7062 Trochanteric bursitis, left hip: Secondary | ICD-10-CM

## 2016-07-20 DIAGNOSIS — Z79899 Other long term (current) drug therapy: Secondary | ICD-10-CM

## 2016-07-20 DIAGNOSIS — F419 Anxiety disorder, unspecified: Secondary | ICD-10-CM | POA: Insufficient documentation

## 2016-07-20 DIAGNOSIS — M25561 Pain in right knee: Secondary | ICD-10-CM

## 2016-07-20 DIAGNOSIS — E785 Hyperlipidemia, unspecified: Secondary | ICD-10-CM | POA: Diagnosis not present

## 2016-07-20 DIAGNOSIS — M542 Cervicalgia: Secondary | ICD-10-CM | POA: Diagnosis present

## 2016-07-20 DIAGNOSIS — Z8249 Family history of ischemic heart disease and other diseases of the circulatory system: Secondary | ICD-10-CM | POA: Diagnosis not present

## 2016-07-20 DIAGNOSIS — M545 Low back pain, unspecified: Secondary | ICD-10-CM

## 2016-07-20 DIAGNOSIS — Z823 Family history of stroke: Secondary | ICD-10-CM | POA: Diagnosis not present

## 2016-07-20 DIAGNOSIS — F1190 Opioid use, unspecified, uncomplicated: Secondary | ICD-10-CM | POA: Diagnosis not present

## 2016-07-20 DIAGNOSIS — M25511 Pain in right shoulder: Secondary | ICD-10-CM

## 2016-07-20 DIAGNOSIS — R10817 Generalized abdominal tenderness: Secondary | ICD-10-CM | POA: Insufficient documentation

## 2016-07-20 DIAGNOSIS — G894 Chronic pain syndrome: Secondary | ICD-10-CM

## 2016-07-20 DIAGNOSIS — M546 Pain in thoracic spine: Secondary | ICD-10-CM

## 2016-07-20 DIAGNOSIS — M7061 Trochanteric bursitis, right hip: Secondary | ICD-10-CM

## 2016-07-20 DIAGNOSIS — M25512 Pain in left shoulder: Secondary | ICD-10-CM

## 2016-07-20 DIAGNOSIS — M549 Dorsalgia, unspecified: Secondary | ICD-10-CM

## 2016-07-20 DIAGNOSIS — M79602 Pain in left arm: Secondary | ICD-10-CM

## 2016-07-20 LAB — COMPREHENSIVE METABOLIC PANEL
ALBUMIN: 4.5 g/dL (ref 3.5–5.0)
ALT: 40 U/L (ref 14–54)
AST: 31 U/L (ref 15–41)
Alkaline Phosphatase: 77 U/L (ref 38–126)
Anion gap: 9 (ref 5–15)
BILIRUBIN TOTAL: 0.8 mg/dL (ref 0.3–1.2)
BUN: 13 mg/dL (ref 6–20)
CHLORIDE: 104 mmol/L (ref 101–111)
CO2: 28 mmol/L (ref 22–32)
CREATININE: 0.85 mg/dL (ref 0.44–1.00)
Calcium: 9.6 mg/dL (ref 8.9–10.3)
GFR calc Af Amer: >60 mL/min (ref >60)
GLUCOSE: 110 mg/dL — AB (ref 65–99)
POTASSIUM: 4.1 mmol/L (ref 3.5–5.1)
Sodium: 141 mmol/L (ref 135–145)
Total Protein: 7.8 g/dL (ref 6.5–8.1)

## 2016-07-20 LAB — VITAMIN B12: VITAMIN B 12: 987 pg/mL — AB (ref 180–914)

## 2016-07-20 LAB — C-REACTIVE PROTEIN: CRP: 2.1 mg/dL — ABNORMAL HIGH (ref <1.0)

## 2016-07-20 LAB — MAGNESIUM: Magnesium: 2.1 mg/dL (ref 1.7–2.4)

## 2016-07-20 LAB — SEDIMENTATION RATE: Sed Rate: 37 mm/hr — ABNORMAL HIGH (ref 0–30)

## 2016-07-20 NOTE — Progress Notes (Signed)
Patient's Name: Nicole Walter  MRN: [id number]  Referring Provider: Leone Haven, MD  DOB: 11-Feb-1961  PCP: Tommi Rumps, MD  DOS: 07/20/2016  Note by: Kathlen Brunswick. Dossie Arbour, MD  Service setting: Ambulatory outpatient  Specialty: Interventional Pain Management  Location: [facility] (AMB) Pain Management Facility    Patient type: New patient   Primary Reason(s) for Visit: Initial Patient Evaluation CC: Neck Pain (both sides, more on the left side of neck, shoulder blades) and Back Pain (thoracic  has protrusion)  HPI  Nicole Walter is a 55 y.o. year old, female patient, who comes today for an initial evaluation. She has Anxiety and depression; Chronic pain syndrome; Lumbar spondylosis; Combined fat and carbohydrate induced hyperlipemia; Thoracic back pain; Chronic neck pain (Location of Primary Source of Pain) (Bilateral) (L>R); Concentration deficit; Hyperlipidemia; Cervical osteoarthritis; Fibromyalgia; Osteopenia; Skin lesions; Chronic hip pain (Bilateral) (L>R); Bursitis of right shoulder; Chronic Greater trochanteric bursitis (Bilateral) (L>R); Chronic pain; Long term current use of opiate analgesic; Long term prescription opiate use; Opiate use; Chronic upper back pain (Location of Secondary source of pain) (Bilateral) (L>R); Chronic shoulder pain (Location of Tertiary source of pain) (Bilateral) (L>R); Chronic low back pain (Bilateral) (L>R); Chronic knee pain (Right); Chronic upper extremity pain (Left); Chronic lower extremity pain (Bilateral) (L>R); and Chronic abdominal pain on her problem list.. Her primarily concern today is the Neck Pain (both sides, more on the left side of neck, shoulder blades) and Back Pain (thoracic  has protrusion)  Pain Assessment: Self-Reported Pain Score: 4  Clinically the patient looks like a 2/10 Reported level is inconsistent with clinical observations. Information on the proper use of the pain score provided to the patient today. Pain Type: Chronic pain Pain  Location: Neck Pain Orientation: Posterior, Left Pain Descriptors / Indicators: Aching, Constant, Radiating, Shooting, Sharp Pain Frequency: Constant  Onset and Duration: Sudden, Started with work-related injury, Date of injury: April 2011 and Present longer than 3 months Cause of pain: Work related accident or event Severity: No change since onset, NAS-11 at its worse: 7/10, NAS-11 at its best: 3/10, NAS-11 now: 4/10 and NAS-11 on the average: 5/10 Timing: Not influenced by the time of the day Aggravating Factors: Bending, Climbing, Kneeling, Lifiting, Motion, Prolonged sitting, Prolonged standing, Squatting, Stooping , Twisting, Walking, Walking uphill, Walking downhill and Working Alleviating Factors: Cold packs, Lying down, Medications, Nerve blocks, Resting and Warm showers or baths Associated Problems: Depression, Fatigue, Inability to concentrate, Sadness, Spasms, Pain that wakes patient up and Pain that does not allow patient to sleep Quality of Pain: Aching, Agonizing, Annoying, Intermittent, Deep, Distressing, Dreadful, Dull, Exhausting, Pressure-like, Pulsating, Sharp, Tender, Throbbing, Toothache-like and Uncomfortable Previous Examinations or Tests: Bone scan, MRI scan and Psychiatric evaluation Previous Treatments: Epidural steroid injections  The patient comes into the clinics today for the first time for a chronic pain management evaluation. The patient indicates her primary pain to be that of the posterior aspect of the neck with the left side being worst on the right. She denies any prior surgeries or injections. Her second worst pain is that of the upper back and ribs following a thoracic dermatomal distribution/rib distribution, bilaterally with the left being worse than the right. She denies any surgeries or injections. The patient's third worst pain is that of both shoulders with the left being worse than the right. She denies any surgeries or injections. She indicates having  had trigger point injections done in the area of the shoulder blades by Dr. Sharlet Salina. She has also  been seen by [facility] for the shoulder. The next area of pain is that of the lower back with the left being worst on the right. She indicates not having had any prior surgeries but does admit to some injections done by Dr. Yves Dill. Her next area of pain is that of both hips with the left being worse than the right. She denies any hip surgeries but does admit to having bursa injections done by Dr. Renard Matter (rheumatology), bilaterally. Next is her knee pain which is only on the right knee over the anterior aspect of the knee. She denies any prior surgeries or injections. Next is her left upper extremity pain follow by both lower extremities. In the case of the lower extremities the left side is worse than the right. In the case of the left lower extremity the pain goes to the knee and then skips into the area of the ankle with the pain being in the lateral aspect of the ankle. She denies any pain going into her feet. In the case of the right lower extremity pain pattern is very similar. She does admit to having had a left ankle torn ligament for which she underwent surgery 2 by Dr. Adela Glimpse from [facility] around 2012 and 2013. She indicates having had multiple trials of physical therapy and she indicates currently undergoing physical therapy twice a week. She also indicates that she is approved for 12 weeks. She indicates having tried some traction but it did not help and in fact it seems to hurt.  Today I took the time to provide the patient with information regarding my pain practice. The patient was informed that my practice is divided into two sections: an interventional pain management section, as well as a completely separate and distinct medication management section. The interventional portion of my practice takes place on Tuesdays and Thursdays, while the medication management is conducted  on Mondays and Wednesdays. Because of the amount of documentation required on both them, they are kept separated. This means that there is the possibility that the patient may be scheduled for a procedure on Tuesday, while also having a medication management appointment on Wednesday. I have also informed the patient that because of current staffing and facility limitations, I no longer take patients for medication management only. To illustrate the reasons for this, I gave the patient the example of a surgeon and how inappropriate it would be to refer a patient to his/her practice so that they write for the post-procedure antibiotics on a surgery done by someone else.   The patient was informed that joining my practice means that they are open to any and all interventional therapies. I clarified for the patient that this does not mean that they will be forced to have any procedures done. What it means is that patients looking for a practitioner to simply write for their pain medications and not take advantage of other interventional techniques will be better served by a different practitioner, other than myself. I made it clear that I prefer to spend my time providing those services that I specialize in.  The patient was also made aware of my Comprehensive Pain Management Safety Guidelines where by joining my practice, they limit all of their nerve blocks and joint injections to those done by our practice, for as long as we are retained to manage their controlled substances.   Historic Controlled Substance Pharmacotherapy Review  Previously Prescribed Opioids: Hydrocodone/APAP 5/500; hydrocodone/APAP 5/325; hydrocodone/APAP 10/325. Currently Prescribed Analgesic: Hydrocodone/APAP  5/325 one tablet by mouth 3 times a day (15 mg/day of hydrocodone) Medications: The patient did not bring the medication(s) to the appointment, as requested in our "New Patient Package" MME/day: 15  mg/day Pharmacodynamics: Analgesic Effect: More than 50% Activity Facilitation: Medication(s) allow patient to sit, stand, walk, and do the basic ADLs Perceived Effectiveness: Described as relatively effective, allowing for increase in activities of daily living (ADL) Side-effects or Adverse reactions: None reported Historical Background Evaluation: [place] PDMP: Five (5) year initial data search conducted. No abnormal patterns identified [place] [employer] Offender Public Information: Non-contributory UDS Results: No UDS results available at this time UDS Interpretation: N/A Medication Assessment Form: Not applicable. Initial evaluation. The patient has not received any medications from our practice Treatment compliance: Not applicable. Initial evaluation Risk Assessment: Aberrant Behavior: None observed or detected today Opioid Fatal Overdose Risk Factors: None identified today Non-fatal overdose hazard ratio (HR): Calculation deferred Fatal overdose hazard ratio (HR): Calculation deferred Substance Use Disorder (SUD) Risk Level: Pending results of Medical Psychology Evaluation for SUD Opioid Risk Tool (ORT) Score: Total Score: 1 Low Risk for SUD (Score <3) Depression Scale Score: PHQ-2: PHQ-2 Total Score: 0 No depression (0) PHQ-9: PHQ-9 Total Score: 0 No depression (0-4)  Pharmacologic Plan: Pending ordered tests and/or consults  Historical Illicit Drug Screen Labs(s): No results found for: MDMA, COCAINSCRNUR, PCPSCRNUR, THCU, ETH  Meds  The patient has a current medication list which includes the following prescription(s): aripiprazole, vitamin d, duloxetine, fluocinonide ointment, fluticasone, gabapentin, hydrocodone-acetaminophen, multi-vitamins, rabeprazole, simvastatin, sucralfate, tizanidine, trazodone, and buspirone.  Current Outpatient Prescriptions on File Prior to Visit  Medication Sig  . Cholecalciferol (VITAMIN D) 2000 units tablet Take 4,000 Units by mouth  daily.  . DULoxetine (CYMBALTA) 20 MG capsule Take 1 capsule (20 mg total) by mouth daily.  Marland Kitchen HYDROcodone-acetaminophen (NORCO/VICODIN) 5-325 MG tablet Take 1 tablet by mouth every 6 (six) hours as needed for moderate pain.  . Multiple Vitamin (MULTI-VITAMINS) TABS Take by mouth daily.   . RABEprazole (ACIPHEX) 20 MG tablet Take 20 mg by mouth daily.   . simvastatin (ZOCOR) 20 MG tablet take 1 tablet by mouth at bedtime  . tiZANidine (ZANAFLEX) 2 MG tablet 1 tablet 2-3 times a day only as needed for severe muscles spasm.   No current facility-administered medications on file prior to visit.     Imaging Review  Cervical Imaging: Cervical MR wo contrast:  Results for orders placed during the hospital encounter of 03/02/16  MR Cervical Spine Wo Contrast   Narrative CLINICAL DATA:  Neck and bilateral arm pain since 2014. No recent injury. Subsequent encounter.  EXAM: MRI CERVICAL SPINE WITHOUT CONTRAST  TECHNIQUE: Multiplanar, multisequence MR imaging of the cervical spine was performed. No intravenous contrast was administered.  COMPARISON:  MRI cervical spine 12/31/2012.  FINDINGS: Vertebral body height, signal and alignment are maintained. The craniocervical junction is normal and cervical cord signal is normal. Imaged paraspinous structures are unremarkable.  C2-3:  Negative.  C3-4: Tiny central protrusion without central canal or foraminal narrowing, unchanged.  C4-5: Minimal posterior bony ridging without central canal or foraminal stenosis.  C5-6: Shallow disc bulge and left worse than right uncovertebral disease. The ventral thecal sac is nearly effaced. Moderate to moderately severe foraminal narrowing is worse on the left. There has been some progression of disease at this level.  C6-7: Very shallow disc bulge is identified and there is some uncovertebral disease. The ventral thecal sac is narrowed but not effaced. The  foramina remain open. The appearance is  unchanged.  C7-T1:  Negative.  IMPRESSION: Some progression spondylosis at C5-6 where there is a disc bulge and uncovertebral disease. The ventral thecal sac is nearly effaced at this level and there is moderate to moderately severe foraminal narrowing, worse on the left.  No notable change in a shallow disc bulge at C6-7 which narrows but does not efface the ventral thecal sac. The foramina are open.   Electronically Signed   By: Inge Rise M.D.   On: 03/02/2016 09:31    Cervical MR wo contrast:  Results for orders placed in visit on 12/31/12  MR [facility] W/O Cm   Narrative * PRIOR REPORT IMPORTED FROM AN EXTERNAL SYSTEM *   PRIOR REPORT IMPORTED FROM THE [facility] [place] EXAM:    Neck pain radiating to shoulder  COMMENTS:   PROCEDURE:     MR  - MR CERVICAL SPINE WO CONT  - Dec 31 2012  8:08AM   RESULT:     History: Neck pain radiating to shoulder.   Comparison Study: No recent.   Findings: Multiplanar, multisequence imaging of the cervical spine  obtained.  C5-C6 small central disc protrusion is present. Similar finding noted at  C6-C7. Neural foramen are patent. Cervical cord is unremarkable. Cranial  vertebral junction is normal. No bony abnormalities.   IMPRESSION:      Multilevel mild disc degeneration. No evidence of  significant disc protrusion, spinal stenosis or neural foraminal  narrowing.  Tiny central disc protrusions are present at C5-C6 and C6-C7 without  spinal  stenosis.       Thoracic Imaging: Thoracic MR wo contrast:  Results for orders placed during the hospital encounter of 07/11/16  MR Thoracic Spine Wo Contrast   Narrative CLINICAL DATA:  55 year old female with mid and upper back pain radiating laterally and to the scapulae. Thoracic radiculitis. Subsequent encounter.  EXAM: MRI THORACIC SPINE WITHOUT CONTRAST  TECHNIQUE: Multiplanar, multisequence MR imaging of the thoracic spine was performed. No  intravenous contrast was administered.  COMPARISON:  Thoracic spine MRI 09/05/2013.  FINDINGS: Limited sagittal imaging of the cervical spine appears stable to that in 2014.  Alignment: Stable mild levoconvex thoracic scoliosis and straightening of thoracic kyphosis.  Vertebrae: No marrow edema or evidence of acute osseous abnormality.  Cord: Spinal cord signal is grossly normal limits at all visualized levels. The conus medullaris appears normal at T12-L1.  Paraspinal and other soft tissues: Negative visualized thoracic and upper abdominal viscera. Visible posterior and lateral chest wall soft tissues appear normal. No posterior rib abnormality identified.  Disc levels:  T1-T2: Moderate facet hypertrophy. Moderate to severe T1 foraminal stenosis. This level is stable.  T2-T3: Right eccentric disc bulge. Uncovertebral hypertrophy. Moderate facet hypertrophy. This level has progressed, the disc disease is new. Severe right T2 foraminal stenosis appears stable to mildly increased.  T3-T4: Right eccentric disc bulge and endplate spurring. Moderate right facet hypertrophy. Moderate right T3 foraminal stenosis. This level is stable.  T4-T5: Mild disc bulge. Mild facet hypertrophy. Mild endplate spurring. This level is mildly progressed but there is no stenosis.  T5-T6: Chronic small central to slightly left paracentral disc protrusion. Mild facet hypertrophy. No stenosis. This level is stable.  T6-T7: Chronic small central disc protrusion. Mild right facet hypertrophy. This level is stable with no stenosis.  T7-T8: Chronic small right paracentral disc protrusion. Underlying circumferential disc bulge appears increased. Moderate facet hypertrophy appears increased. However, there is  no stenosis.  T8-T9: Moderate facet hypertrophy. This level is stable with no stenosis.  T9-T10: Moderate facet hypertrophy greater on the left. Mild bilateral T9 foraminal stenosis. This  level is stable.  T10-T11: Moderate to severe facet hypertrophy is chronic. Chronic left far lateral disc bulge and endplate spurring. Stable mild T10 foraminal stenosis. This level is stable.  T11-T12: Mild to moderate facet hypertrophy. Stable mild left T11 foraminal stenosis. This level is stable.  T12-L1:  Negative.  IMPRESSION: 1. Mild progression of chronic thoracic spine degeneration since 2014 at T2-T3, T4-T5, and the T7-T8 levels. However, of these there is only stenosis at T2-T3 where right severe T2 neural foraminal stenosis appears increased. 2. Small thoracic disc herniations at T5-T6 through T7-T8 are stable with no spinal stenosis. No thoracic spinal cord abnormality identified. 3. Stable moderate or severe neural foraminal stenosis at the bilateral T1 and right T3 nerve levels. Stable mild bilateral T9, T10, and left T11 foraminal stenosis. 4. No acute osseous abnormality identified. Mild levoconvex scoliosis.   Electronically Signed   By: Genevie Ann M.D.   On: 07/11/2016 15:28    Thoracic MR wo contrast:  Results for orders placed in visit on 09/05/13  MR [place] W/O Cm   Narrative * PRIOR REPORT IMPORTED FROM AN EXTERNAL SYSTEM *   PRIOR REPORT IMPORTED FROM THE SYN[facility] [place] EXAM:    bilateral thoracic pain  COMMENTS:   PROCEDURE:     MR  - MR THORACIC SPINE WO  - Sep 05 2013  9:31AM   RESULT:     History: Thoracic pain.   Comparison Study: No prior.   Findings: Multiplanar, multisequence imaging of the a thoracic spine is  obtained. No focal bony abnormality identified. Thoracic cord is  intrinsically normal. T7-T8 right paracentral disc protrusion is present.  This is small. There is minimal narrowing of the adjacent neural foramen  and  minimal deformity thecal sac. No other significant disc protrusions are  noted.   IMPRESSION:      T7-T8 right paracentral disc protrusion as described  above.       Lumbosacral  Imaging: Lumbar MR wo contrast:  Results for orders placed during the hospital encounter of 03/02/16  MR Lumbar Spine Wo Contrast   Narrative CLINICAL DATA:  Twisting injury 7 months ago with persistent low back and bilateral leg pain, worse on the left. No previous relevant surgery.  EXAM: MRI LUMBAR SPINE WITHOUT CONTRAST  TECHNIQUE: Multiplanar, multisequence MR imaging of the lumbar spine was performed. No intravenous contrast was administered.  COMPARISON:  Abdominal pelvic CT 12/20/2015  FINDINGS: Segmentation: Conventional anatomy assumed, with the last open disc space designated L5-S1.  Alignment:  Normal.  Vertebrae: No worrisome osseous lesion, acute fracture or pars defect. The lumbar pedicles are somewhat short on a congenital basis. The visualized sacroiliac joints appear unremarkable.  Conus medullaris: Extends to the L1 level and appears normal.  Paraspinal and other soft tissues: No significant paraspinal findings.  Disc levels:  There are no significant disc space findings from T11-12 through L1-2.  L2-3: Stable loss of disc height with mild annular disc bulging. No spinal stenosis or nerve root encroachment.  L3-4: Stable loss of disc height with annular disc bulging and a shallow left paracentral disc protrusion. There is mild facet and ligamentous hypertrophy. These factors contribute to asymmetric narrowing of the left lateral recess and potential left L4 nerve root encroachment. Both foramina are mildly narrowed without  definite L3 nerve root encroachment.  L4-5: Disc height is relatively maintained. There is mild disc bulging with moderate facet and ligamentous hypertrophy. These factors contribute to mild narrowing of the lateral recesses and foramina bilaterally. There is no nerve root encroachment.  L5-S1: Disc height and hydration are maintained. There is mild disc bulging and facet hypertrophy. No spinal stenosis or nerve  root encroachment.  IMPRESSION: 1. No acute findings, high-grade spinal stenosis or definite nerve root encroachment. 2. At L3-4, there is disc bulging and a shallow left paracentral disc protrusion, contributing to asymmetric narrowing of the left lateral recess and potential left L4 nerve root encroachment. 3. Moderate facet and ligamentous hypertrophy at L4-5.   Electronically Signed   By: Richardean Sale M.D.   On: 03/02/2016 09:10    Note: Imaging results reviewed.  ROS  Cardiovascular History: Negative for hypertension, coronary artery diseas, myocardial infraction, anticoagulant therapy or heart failure Pulmonary or Respiratory History: Snoring  Neurological History: Negative for epilepsy, stroke, urinary or fecal inontinence, spina bifida or tethered cord syndrome Review of Past Neurological Studies: No results found for this or any previous visit. Psychological-Psychiatric History: Anxiety and Depression Gastrointestinal History: Negative for peptic ulcer disease, hiatal hernia, GERD, IBS, hepatitis, cirrhosis or pancreatitis Genitourinary History: Negative for nephrolithiasis, hematuria, renal failure or chronic kidney disease Hematological History: Negative for anticoagulant therapy, anemia, bruising or bleeding easily, hemophilia, sickle cell disease or trait, thrombocytopenia or coagulupathies Endocrine History: Negative for diabetes or thyroid disease Rheumatologic History: Osteoarthritis and Fibromyalgia Musculoskeletal History: Negative for myasthenia gravis, muscular dystrophy, multiple sclerosis or malignant hyperthermia Work History: Quit going to work on his/her own  Allergies  Nicole Walter is allergic to food and other.  Laboratory Chemistry  Inflammation Markers Lab Results  Component Value Date   ESRSEDRATE 37 (H) 07/20/2016   CRP 2.1 (H) 07/20/2016    Renal Function Lab Results  Component Value Date   BUN 13 07/20/2016   CREATININE 0.85 07/20/2016    GFRAA >60 07/20/2016   GFRNONAA >60 07/20/2016    Hepatic Function Lab Results  Component Value Date   AST 31 07/20/2016   ALT 40 07/20/2016   ALBUMIN 4.5 07/20/2016    Electrolytes Lab Results  Component Value Date   NA 141 07/20/2016   K 4.1 07/20/2016   CL 104 07/20/2016   CALCIUM 9.6 07/20/2016   MG 2.1 07/20/2016    Pain Modulating Vitamins Lab Results  Component Value Date   VITAMINB12 987 (H) 07/20/2016    Coagulation Parameters Lab Results  Component Value Date   PLT 256 07/05/2016    Cardiovascular Lab Results  Component Value Date   HGB 11.9 (L) 07/05/2016   HCT 34.6 (L) 07/05/2016   Note: Lab results reviewed.  [place]  Medical:  Nicole Walter  has a past medical history of Abnormal liver enzymes (02/08/2016); Allergy; Anxiety; Chest pain (07/05/2016); Chronic pain; Depression; DJD (degenerative joint disease); Fibromyalgia; High cholesterol; and Rash of back (01/28/2016). Family: family history includes Arthritis in her father, paternal grandfather, and paternal grandmother; Cancer in her brother and father; Dementia in her sister; Diabetes in her sister; Drug abuse in her brother, brother, and sister; Heart disease in her mother; Post-traumatic stress disorder in her brother; Stroke in her mother. Surgical:  has a past surgical history that includes Cholecystectomy; Tubal ligation; Ankle surgery; Nasal sinus surgery; Tonsillectomy; Colonoscopy; Esophagogastroduodenoscopy (egd) with propofol (N/A, 02/22/2016); and Colonoscopy with propofol (N/A, 02/22/2016). Tobacco:  reports that she quit smoking about a  year ago. She started smoking about 42 years ago. She uses smokeless tobacco. Alcohol:  reports that she does not drink alcohol. Drug:  reports that she does not use drugs. Active Ambulatory Problems    Diagnosis Date Noted  . Anxiety and depression 07/11/2015  . Chronic pain syndrome 07/11/2015  . Lumbar spondylosis 01/06/2016  . Combined fat and  carbohydrate induced hyperlipemia 07/11/2015  . Thoracic back pain 01/28/2016  . Chronic neck pain (Location of Primary Source of Pain) (Bilateral) (L>R) 01/28/2016  . Concentration deficit 01/28/2016  . Hyperlipidemia 02/18/2016  . Cervical osteoarthritis 02/08/2016  . Fibromyalgia 02/08/2016  . Osteopenia 02/08/2016  . Skin lesions 04/27/2016  . Chronic hip pain (Bilateral) (L>R) 05/31/2016  . Bursitis of right shoulder 05/17/2016  . Chronic Greater trochanteric bursitis (Bilateral) (L>R) 05/31/2016  . Chronic pain 07/20/2016  . Long term current use of opiate analgesic 07/20/2016  . Long term prescription opiate use 07/20/2016  . Opiate use 07/20/2016  . Chronic upper back pain (Location of Secondary source of pain) (Bilateral) (L>R) 07/20/2016  . Chronic shoulder pain (Location of Tertiary source of pain) (Bilateral) (L>R) 07/20/2016  . Chronic low back pain (Bilateral) (L>R) 07/20/2016  . Chronic knee pain (Right) 07/20/2016  . Chronic upper extremity pain (Left) 07/20/2016  . Chronic lower extremity pain (Bilateral) (L>R) 07/20/2016  . Chronic abdominal pain 07/20/2016   Resolved Ambulatory Problems    Diagnosis Date Noted  . Encounter to establish care 01/14/2016  . Generalized abdominal tenderness 01/14/2016  . Rash of back 01/28/2016  . Major depressive disorder, recurrent episode, moderate ([place]) 02/14/2016  . Abnormal liver enzymes 02/08/2016  . Chest pain 07/05/2016   Past Medical History:  Diagnosis Date  . Abnormal liver enzymes 02/08/2016  . Allergy   . Anxiety   . Chest pain 07/05/2016  . Chronic pain   . Depression   . DJD (degenerative joint disease)   . Fibromyalgia   . High cholesterol   . Rash of back 01/28/2016    Constitutional Exam  General appearance: Well nourished, well developed, and well hydrated. In no acute distress Vitals:   07/20/16 0748  BP: 121/67  Pulse: 77  Resp: 16  Temp: 97.7 F (36.5 C)  TempSrc: Oral  SpO2: 100%  Weight:  172 lb (78 kg)  Height: 5\' 4"  (1.626 m)  BMI Assessment: Estimated body mass index is 29.52 kg/m as calculated from the following:   Height as of this encounter: 5\' 4"  (1.626 m).   Weight as of this encounter: 172 lb (78 kg).   BMI interpretation: (25-29.9 kg/m2) = Overweight: This range is associated with a 20% higher incidence of chronic pain. BMI Readings from Last 4 Encounters:  07/20/16 29.52 kg/m  07/14/16 29.39 kg/m  07/05/16 29.87 kg/m  07/05/16 29.97 kg/m   Wt Readings from Last 4 Encounters:  07/20/16 172 lb (78 kg)  07/14/16 171 lb 3.2 oz (77.7 kg)  07/05/16 174 lb (78.9 kg)  07/05/16 174 lb 9.6 oz (79.2 kg)  Psych/Mental status: Alert and oriented x 3 (person, place, & time) Eyes: PERLA Respiratory: No evidence of acute respiratory distress  Cervical Spine Exam  Inspection: No masses, redness, or swelling Alignment: Symmetrical Functional ROM: ROM appears unrestricted Stability: No instability detected Muscle strength & Tone: Functionally intact Sensory: Unimpaired Palpation: Non-contributory  Upper Extremity (UE) Exam    Side: Right upper extremity  Side: Left upper extremity  Inspection: No masses, redness, swelling, or asymmetry  Inspection: No masses, redness, swelling,  or asymmetry  Functional ROM: ROM appears unrestricted          Functional ROM: ROM appears unrestricted          Muscle strength & Tone: Functionally intact  Muscle strength & Tone: Functionally intact  Sensory: Unimpaired  Sensory: Unimpaired  Palpation: Non-contributory  Palpation: Non-contributory   Thoracic Spine Exam  Inspection: No masses, redness, or swelling Alignment: Symmetrical Functional ROM: ROM appears unrestricted Stability: No instability detected Sensory: Unimpaired Muscle strength & Tone: Functionally intact Palpation: Non-contributory  Lumbar Spine Exam  Inspection: No masses, redness, or swelling Alignment: Symmetrical Functional ROM: Limited ROM Stability:  No instability detected Muscle strength & Tone: Functionally intact Sensory: Movement-associated pain Palpation: Complains of area being tender to palpation Provocative Tests: Lumbar Hyperextension and rotation test: Positive bilaterally for facet joint pain. Patrick's Maneuver: evaluation deferred today              Gait & Posture Assessment  Ambulation: Unassisted Gait: Relatively normal for age and body habitus Posture: WNL   Lower Extremity Exam    Side: Right lower extremity  Side: Left lower extremity  Inspection: No masses, redness, swelling, or asymmetry  Inspection: No masses, redness, swelling, or asymmetry  Functional ROM: ROM appears unrestricted          Functional ROM: ROM appears unrestricted          Muscle strength & Tone: Functionally intact  Muscle strength & Tone: Functionally intact  Sensory: Unimpaired  Sensory: Unimpaired  Palpation: Non-contributory  Palpation: Non-contributory    Assessment  Primary Diagnosis & Pertinent Problem List: The primary encounter diagnosis was Chronic pain. Diagnoses of Long term current use of opiate analgesic, Long term prescription opiate use, Opiate use, Chronic neck pain (Location of Primary Source of Pain) (Bilateral) (L>R), Chronic upper back pain (Location of Secondary source of pain) (Bilateral) (L>R), Chronic shoulder pain, unspecified laterality, Chronic low back pain (Bilateral) (L>R), Chronic hip pain, unspecified laterality, Chronic pain syndrome, Chronic knee pain (Right), Chronic upper extremity pain (Left), Chronic pain of lower extremity, unspecified laterality, Lumbar spondylosis, unspecified spinal osteoarthritis, Chronic Greater trochanteric bursitis (Bilateral) (L>R), Chronic abdominal pain, and Fibromyalgia were also pertinent to this visit.  Visit Diagnosis: 1. Chronic pain   2. Long term current use of opiate analgesic   3. Long term prescription opiate use   4. Opiate use   5. Chronic neck pain (Location of  Primary Source of Pain) (Bilateral) (L>R)   6. Chronic upper back pain (Location of Secondary source of pain) (Bilateral) (L>R)   7. Chronic shoulder pain, unspecified laterality   8. Chronic low back pain (Bilateral) (L>R)   9. Chronic hip pain, unspecified laterality   10. Chronic pain syndrome   11. Chronic knee pain (Right)   12. Chronic upper extremity pain (Left)   13. Chronic pain of lower extremity, unspecified laterality   14. Lumbar spondylosis, unspecified spinal osteoarthritis   15. Chronic Greater trochanteric bursitis (Bilateral) (L>R)   16. Chronic abdominal pain   17. Fibromyalgia     Assessment: No problem-specific Assessment & Plan notes found for this encounter.   Plan of Care  Initial Treatment Plan:  Please be advised that as per protocol, today's visit has been an evaluation only. We have not taken over the patient's controlled substance management.  Problem List Items Addressed This Visit      High   Chronic abdominal pain (Chronic)   Chronic Greater trochanteric bursitis (Bilateral) (L>R) (Chronic)   Chronic hip  pain (Bilateral) (L>R) (Chronic)   Relevant Medications   traZODone (DESYREL) 50 MG tablet   gabapentin (NEURONTIN) 300 MG capsule   Chronic knee pain (Right) (Chronic)   Chronic low back pain (Bilateral) (L>R) (Chronic)   Chronic lower extremity pain (Bilateral) (L>R) (Chronic)   Chronic neck pain (Location of Primary Source of Pain) (Bilateral) (L>R) (Chronic)   Relevant Medications   traZODone (DESYREL) 50 MG tablet   gabapentin (NEURONTIN) 300 MG capsule   Chronic pain - Primary (Chronic)   Relevant Medications   traZODone (DESYREL) 50 MG tablet   gabapentin (NEURONTIN) 300 MG capsule   Other Relevant Orders   Comprehensive metabolic panel (Completed)   C-reactive protein (Completed)   Magnesium (Completed)   Sedimentation rate (Completed)   Vitamin B12 (Completed)   25-Hydroxyvitamin D Lcms D2+D3   Ambulatory referral to  Psychology   Chronic pain syndrome (Chronic)   Chronic shoulder pain (Location of Tertiary source of pain) (Bilateral) (L>R) (Chronic)   Chronic upper back pain (Location of Secondary source of pain) (Bilateral) (L>R) (Chronic)   Chronic upper extremity pain (Left) (Chronic)   Relevant Medications   traZODone (DESYREL) 50 MG tablet   gabapentin (NEURONTIN) 300 MG capsule   Fibromyalgia (Chronic)   Lumbar spondylosis (Chronic)     Medium   Long term current use of opiate analgesic (Chronic)   Relevant Orders   Compliance Drug Analysis, Ur   Ambulatory referral to Psychology   Long term prescription opiate use (Chronic)   Opiate use (Chronic)    Other Visit Diagnoses   None.     Lab-work & Procedure Ordered: Orders Placed This Encounter  Procedures  . Compliance Drug Analysis, Ur  . Comprehensive metabolic panel  . C-reactive protein  . Magnesium  . Sedimentation rate  . Vitamin B12  . 25-Hydroxyvitamin D Lcms D2+D3  . Ambulatory referral to Psychology    Pharmacotherapy: Medications ordered:  No orders of the defined types were placed in this encounter.  Prescriptions ordered during this visit: New Prescriptions   No medications on file   Medications administered during this visit: Nicole Walter had no medications administered during this visit.   Pharmacotherapy plan under consideration:  Following CDC guidelines, review her medical records and determine whether or not she has a condition for which the use of opioids is justified. The patient does have a long-standing history of benzodiazepine use as well.    Interventional Therapies: Interventional procedures under consideration:  Diagnostic bilateral cervical facet block under fluoroscopic guidance and IV sedation.  Possible bilateral cervical facet radiofrequency ablation under fluoroscopic guidance and IV sedation.  Diagnostic left-sided cervical epidural steroid injection under fluoroscopic guidance, with or  without sedation.  Diagnostic bilateral intra-articular shoulder joint injection under fluoroscopic guidance, with or without sedation.  Diagnostic bilateral suprascapular nerve block under fluoroscopic guidance, with or without sedation.  Possible bilateral suprascapular nerve radiofrequency ablation under fluoroscopic guidance and IV sedation.  Diagnostic bilateral lumbar facet block under fluoroscopic guidance and IV sedation.  Possible bilateral lumbar facet radiofrequency ablation under fluoroscopic guidance and IV sedation.  Diagnostic bilateral intra-articular hip joint injection under fluoroscopic guidance, with or without sedation.  Diagnostic bilateral femoral and obturator tickler branch blocks under fluoroscopic guidance, with or without sedation.  Possible bilateral hip joint radiofrequency ablation  Diagnostic right intra-articular knee joint injection, without fluoroscopy or IV sedation.  Possible series of 5, right-sided, intra-articular knee joint injections with Hyalgan.  Possible right sided genicular nerve block under fluoroscopic guidance, with or  without sedation.  Possible right sided genicular nerve radiofrequency ablation under fluoroscopic guidance and IV sedation.  Possible left-sided lumbar epidural steroid injection under fluoroscopic guidance, with or without sedation.    Referral(s) or Consult(s): Medical psychology consult for substance use disorder evaluation  Requested PM Follow-up: No Follow-up on file.  Future Appointments Date Time Provider [place]  07/21/2016 10:00 AM Shin-Yiing Aurea Graff, PT [facility] None  07/25/2016 8:15 AM Shin-Yiing Aurea Graff, PT [facility] None  07/27/2016 8:45 AM Shin-Yiing Aurea Graff, PT [facility] None  08/01/2016 8:45 AM Shin-Yiing Aurea Graff, PT [facility] None  08/03/2016 8:45 AM Shin-Yiing Aurea Graff, PT [facility] None  08/09/2016 9:15 AM Alanson Puls, PT [facility] None  09/14/2016 8:45 AM Leone Haven, MD [facility] None     Primary Care Physician: Tommi Rumps, MD Location: [facility] Note by: Kathlen Brunswick. Dossie Arbour, M.D, DABA, DABAPM, DABPM, DABIPP, FIPP  Pain Score Disclaimer: We use the NRS-11 scale. This is a self-reported, subjective measurement of pain severity with only modest accuracy. It is used primarily to identify changes within a particular patient. It must be understood that outpatient pain scales are significantly less accurate that those used for research, where they can be applied under ideal controlled circumstances with minimal exposure to variables. In reality, the score is likely to be a combination of pain intensity and pain affect, where pain affect describes the degree of emotional arousal or changes in action readiness caused by the sensory experience of pain. Factors such as social and work situation, setting, emotional state, anxiety levels, expectation, and prior pain experience may influence pain perception and show large inter-individual differences that may also be affected by time variables.  Patient instructions provided during this appointment: Patient Instructions   Pain Management Discharge Instructions  General Discharge Instructions :  If you need to reach your doctor call: Monday-Friday 8:00 am - 4:00 pm at [phone] or toll free [phone].  After clinic hours [phone] to have operator reach doctor.  Bring all of your medication bottles to all your appointments in the pain clinic.  To cancel or reschedule your appointment with Pain Management please remember to call 24 hours in advance to avoid a fee.  Refer to the educational materials which you have been given on: General Risks, I had my Procedure. Discharge Instructions, Post Sedation.  Post Procedure Instructions:  The drugs you were given will stay in your system until tomorrow, so for the next 24 hours you should not drive, make any legal decisions or drink any  alcoholic beverages.  You may eat anything you prefer, but it is better to start with liquids then soups and crackers, and gradually work up to solid foods.  Please notify your doctor immediately if you have any unusual bleeding, trouble breathing or pain that is not related to your normal pain.  Depending on the type of procedure that was done, some parts of your body may feel week and/or numb.  This usually clears up by tonight or the next day.  Walk with the use of an assistive device or accompanied by an adult for the 24 hours.  You may use ice on the affected area for the first 24 hours.  Put ice in a Ziploc bag and cover with a towel and place against area 15 minutes on 15 minutes off.  You may switch to heat after 24 hours.GENERAL RISKS AND COMPLICATIONS  What are the risk, side effects and possible complications? Generally speaking, most procedures are safe.  However, with  any procedure there are risks, side effects, and the possibility of complications.  The risks and complications are dependent upon the sites that are lesioned, or the type of nerve block to be performed.  The closer the procedure is to the spine, the more serious the risks are.  Great care is taken when placing the radio frequency needles, block needles or lesioning probes, but sometimes complications can occur. 1. Infection: Any time there is an injection through the skin, there is a risk of infection.  This is why sterile conditions are used for these blocks.  There are four possible types of infection. 1. Localized skin infection. 2. Central Nervous System Infection-This can be in the form of Meningitis, which can be deadly. 3. Epidural Infections-This can be in the form of an epidural abscess, which can cause pressure inside of the spine, causing compression of the spinal cord with subsequent paralysis. This would require an emergency surgery to decompress, and there are no guarantees that the patient would recover  from the paralysis. 4. Discitis-This is an infection of the intervertebral discs.  It occurs in about 1% of discography procedures.  It is difficult to treat and it may lead to surgery.        2. Pain: the needles have to go through skin and soft tissues, will cause soreness.       3. Damage to internal structures:  The nerves to be lesioned may be near blood vessels or    other nerves which can be potentially damaged.       4. Bleeding: Bleeding is more common if the patient is taking blood thinners such as  aspirin, Coumadin, Ticiid, Plavix, etc., or if he/she have some genetic predisposition  such as hemophilia. Bleeding into the spinal canal can cause compression of the spinal  cord with subsequent paralysis.  This would require an emergency surgery to  decompress and there are no guarantees that the patient would recover from the  paralysis.       5. Pneumothorax:  Puncturing of a lung is a possibility, every time a needle is introduced in  the area of the chest or upper back.  Pneumothorax refers to free air around the  collapsed lung(s), inside of the thoracic cavity (chest cavity).  Another two possible  complications related to a similar event would include: Hemothorax and Chylothorax.   These are variations of the Pneumothorax, where instead of air around the collapsed  lung(s), you may have blood or chyle, respectively.       6. Spinal headaches: They may occur with any procedures in the area of the spine.       7. Persistent CSF (Cerebro-Spinal Fluid) leakage: This is a rare problem, but may occur  with prolonged intrathecal or epidural catheters either due to the formation of a fistulous  track or a dural tear.       8. Nerve damage: By working so close to the spinal cord, there is always a possibility of  nerve damage, which could be as serious as a permanent spinal cord injury with  paralysis.       9. Death:  Although rare, severe deadly allergic reactions known as "Anaphylactic  reaction"  can occur to any of the medications used.      10. Worsening of the symptoms:  We can always make thing worse.  What are the chances of something like this happening? Chances of any of this occuring are extremely low.  By statistics,  you have more of a chance of getting killed in a motor vehicle accident: while driving to the hospital than any of the above occurring .  Nevertheless, you should be aware that they are possibilities.  In general, it is similar to taking a shower.  Everybody knows that you can slip, hit your head and get killed.  Does that mean that you should not shower again?  Nevertheless always keep in mind that statistics do not mean anything if you happen to be on the wrong side of them.  Even if a procedure has a 1 (one) in a 1,000,000 (million) chance of going wrong, it you happen to be that one..Also, keep in mind that by statistics, you have more of a chance of having something go wrong when taking medications.  Who should not have this procedure? If you are on a blood thinning medication (e.g. Coumadin, Plavix, see list of "Blood Thinners"), or if you have an active infection going on, you should not have the procedure.  If you are taking any blood thinners, please inform your physician.  How should I prepare for this procedure?  Do not eat or drink anything at least six hours prior to the procedure.  Bring a driver with you .  It cannot be a taxi.  Come accompanied by an adult that can drive you back, and that is strong enough to help you if your legs get weak or numb from the local anesthetic.  Take all of your medicines the morning of the procedure with just enough water to swallow them.  If you have diabetes, make sure that you are scheduled to have your procedure done first thing in the morning, whenever possible.  If you have diabetes, take only half of your insulin dose and notify our nurse that you have done so as soon as you arrive at the clinic.  If you are  diabetic, but only take blood sugar pills (oral hypoglycemic), then do not take them on the morning of your procedure.  You may take them after you have had the procedure.  Do not take aspirin or any aspirin-containing medications, at least eleven (11) days prior to the procedure.  They may prolong bleeding.  Wear loose fitting clothing that may be easy to take off and that you would not mind if it got stained with Betadine or blood.  Do not wear any jewelry or perfume  Remove any nail coloring.  It will interfere with some of our monitoring equipment.  NOTE: Remember that this is not meant to be interpreted as a complete list of all possible complications.  Unforeseen problems may occur.  BLOOD THINNERS The following drugs contain aspirin or other products, which can cause increased bleeding during surgery and should not be taken for 2 weeks prior to and 1 week after surgery.  If you should need take something for relief of minor pain, you may take acetaminophen which is found in Tylenol,m Datril, Anacin-3 and Panadol. It is not blood thinner. The products listed below are.  Do not take any of the products listed below in addition to any listed on your instruction sheet.  A.P.C or A.P.C with Codeine Codeine Phosphate Capsules #3 Ibuprofen Ridaura  ABC compound Congesprin Imuran rimadil  Advil Cope Indocin Robaxisal  Alka-Seltzer Effervescent Pain Reliever and Antacid Coricidin or Coricidin-D  Indomethacin Rufen  Alka-Seltzer plus Cold Medicine Cosprin Ketoprofen S-A-C Tablets  Anacin Analgesic Tablets or Capsules Coumadin Korlgesic Salflex  Anacin Extra Strength Analgesic  tablets or capsules CP-2 Tablets Lanoril Salicylate  Anaprox Cuprimine Capsules Levenox Salocol  Anexsia-D Dalteparin Magan Salsalate  Anodynos Darvon compound Magnesium Salicylate Sine-off  Ansaid Dasin Capsules Magsal Sodium Salicylate  Anturane Depen Capsules Marnal Soma  APF Arthritis pain formula Dewitt's Pills  Measurin Stanback  Argesic Dia-Gesic Meclofenamic Sulfinpyrazone  Arthritis Bayer Timed Release Aspirin Diclofenac Meclomen Sulindac  Arthritis pain formula Anacin Dicumarol Medipren Supac  Analgesic (Safety coated) Arthralgen Diffunasal Mefanamic Suprofen  Arthritis Strength Bufferin Dihydrocodeine Mepro Compound Suprol  Arthropan liquid Dopirydamole Methcarbomol with Aspirin Synalgos  ASA tablets/Enseals Disalcid Micrainin Tagament  Ascriptin Doan's Midol Talwin  Ascriptin A/D Dolene Mobidin Tanderil  Ascriptin Extra Strength Dolobid Moblgesic Ticlid  Ascriptin with Codeine Doloprin or Doloprin with Codeine Momentum Tolectin  Asperbuf Duoprin Mono-gesic Trendar  Aspergum Duradyne Motrin or Motrin IB Triminicin  Aspirin plain, buffered or enteric coated Durasal Myochrisine Trigesic  Aspirin Suppositories Easprin Nalfon Trillsate  Aspirin with Codeine Ecotrin Regular or Extra Strength Naprosyn Uracel  Atromid-S Efficin Naproxen Ursinus  Auranofin Capsules Elmiron Neocylate Vanquish  Axotal Emagrin Norgesic Verin  Azathioprine Empirin or Empirin with Codeine Normiflo Vitamin E  Azolid Emprazil Nuprin Voltaren  Bayer Aspirin plain, buffered or children's or timed BC Tablets or powders Encaprin Orgaran Warfarin Sodium  Buff-a-Comp Enoxaparin Orudis Zorpin  Buff-a-Comp with Codeine Equegesic Os-Cal-Gesic   Buffaprin Excedrin plain, buffered or Extra Strength Oxalid   Bufferin Arthritis Strength Feldene Oxphenbutazone   Bufferin plain or Extra Strength Feldene Capsules Oxycodone with Aspirin   Bufferin with Codeine Fenoprofen Fenoprofen Pabalate or Pabalate-SF   Buffets II Flogesic Panagesic   Buffinol plain or Extra Strength Florinal or Florinal with Codeine Panwarfarin   Buf-Tabs Flurbiprofen Penicillamine   Butalbital Compound Four-way cold tablets Penicillin   Butazolidin Fragmin Pepto-Bismol   Carbenicillin Geminisyn Percodan   Carna Arthritis Reliever Geopen Persantine    Carprofen Gold's salt Persistin   Chloramphenicol Goody's Phenylbutazone   Chloromycetin Haltrain Piroxlcam   Clmetidine heparin Plaquenil   Cllnoril Hyco-pap Ponstel   Clofibrate Hydroxy chloroquine Propoxyphen         Before stopping any of these medications, be sure to consult the physician who ordered them.  Some, such as Coumadin (Warfarin) are ordered to prevent or treat serious conditions such as "deep thrombosis", "pumonary embolisms", and other heart problems.  The amount of time that you may need off of the medication may also vary with the medication and the reason for which you were taking it.  If you are taking any of these medications, please make sure you notify your pain physician before you undergo any procedures.

## 2016-07-20 NOTE — Progress Notes (Signed)
Safety precautions to be maintained throughout the outpatient stay will include: orient to surroundings, keep bed in low position, maintain call bell within reach at all times, provide assistance with transfer out of bed and ambulation.  

## 2016-07-20 NOTE — Patient Instructions (Signed)

## 2016-07-21 ENCOUNTER — Encounter: Payer: BLUE CROSS/BLUE SHIELD | Admitting: Physical Therapy

## 2016-07-24 LAB — 25-HYDROXYVITAMIN D LCMS D2+D3: 25-HYDROXY, VITAMIN D: 58 ng/mL

## 2016-07-24 LAB — 25-HYDROXY VITAMIN D LCMS D2+D3
25-Hydroxy, Vitamin D-2: <1 ng/mL
25-Hydroxy, Vitamin D-3: 58 ng/mL

## 2016-07-25 ENCOUNTER — Ambulatory Visit: Payer: BLUE CROSS/BLUE SHIELD | Admitting: Physical Therapy

## 2016-07-25 ENCOUNTER — Telehealth: Payer: Self-pay | Admitting: *Deleted

## 2016-07-25 DIAGNOSIS — M545 Low back pain, unspecified: Secondary | ICD-10-CM

## 2016-07-25 DIAGNOSIS — R262 Difficulty in walking, not elsewhere classified: Secondary | ICD-10-CM | POA: Diagnosis not present

## 2016-07-25 NOTE — Telephone Encounter (Signed)
Pt has requested to have medication refill for hydrocodone.  Pt contact [phone]
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
## 2016-07-25 NOTE — Telephone Encounter (Signed)
Last refill 07/05/16 for 90 last OV 8/17, please advise to refill?
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
## 2016-07-26 MED ORDER — HYDROCODONE-ACETAMINOPHEN 5-325 MG PO TABS: 1.0000 | ORAL_TABLET | Freq: Four times a day (QID) | ORAL | 0 refills | Status: DC | PRN

## 2016-07-26 NOTE — Telephone Encounter (Signed)
Reordered. Place at front.

## 2016-07-27 ENCOUNTER — Ambulatory Visit: Payer: BLUE CROSS/BLUE SHIELD

## 2016-07-27 DIAGNOSIS — M545 Low back pain, unspecified: Secondary | ICD-10-CM

## 2016-07-27 DIAGNOSIS — R262 Difficulty in walking, not elsewhere classified: Secondary | ICD-10-CM

## 2016-07-27 NOTE — Telephone Encounter (Signed)
Notified patient that RX is up front to pick up.  

## 2016-07-27 NOTE — Therapy (Signed)
[place] MAIN [facility] SERVICES [address] [place], [place], [address] Phone: [phone]   Fax:  [phone]  Physical Therapy Treatment  Patient Details  Name: Nicole Walter MRN: [id number] Date of Birth: 1961-04-27 No Data Recorded  Encounter Date: 07/27/2016      PT End of Session - 07/27/16 1229    Visit Number 4   Number of Visits 17   Date for PT Re-Evaluation 09/13/16   Authorization Type blue cross blue shield   PT Start Time 0845   PT Stop Time 0925   PT Time Calculation (min) 40 min   Activity Tolerance Patient tolerated treatment well   Behavior During Therapy [facility] for tasks assessed/performed      Past Medical History:  Diagnosis Date  . Abnormal liver enzymes 02/08/2016  . Allergy   . Anxiety   . Chest pain 07/05/2016  . Chronic pain   . Depression   . DJD (degenerative joint disease)    lumbar  . Fibromyalgia   . High cholesterol   . Rash of back 01/28/2016    Past Surgical History:  Procedure Laterality Date  . ANKLE SURGERY     x 2  . CHOLECYSTECTOMY    . COLONOSCOPY    . COLONOSCOPY WITH PROPOFOL N/A 02/22/2016   Procedure: COLONOSCOPY WITH PROPOFOL;  Surgeon: Lollie Sails, MD;  Location: [facility] ENDOSCOPY;  Service: Endoscopy;  Laterality: N/A;  . ESOPHAGOGASTRODUODENOSCOPY (EGD) WITH PROPOFOL N/A 02/22/2016   Procedure: ESOPHAGOGASTRODUODENOSCOPY (EGD) WITH PROPOFOL;  Surgeon: Lollie Sails, MD;  Location: [facility] ENDOSCOPY;  Service: Endoscopy;  Laterality: N/A;  . NASAL SINUS SURGERY    . TONSILLECTOMY    . TUBAL LIGATION      There were no vitals filed for this visit.      Subjective Assessment - 07/27/16 1222    Subjective Pt reports she is at her baseline today. She is much less anxious and upset than she was at her last appointment. She is performing HEP and has no specific questions or concerns at this time.    Pertinent History lumbar injection 3 months ago, bursa injection 2 months ago,  depression, anxiety, fibromyaliga,left ankle surgery 04/2011 and 11/2011   Limitations Sitting;Lifting;Standing;Walking   How long can you sit comfortably? unable to sit comfortably   How long can you stand comfortably? 15 minutes   How long can you walk comfortably? 15 minutes   Diagnostic tests MRI 2014, MRI 04/2016, MRI 07/2016   Patient Stated Goals Patient wants to be able to get stronger to get back to work   Currently in Pain? Yes   Pain Score 5    Pain Location Back   Pain Orientation Upper   Pain Descriptors / Indicators Aching   Pain Onset More than a month ago   Pain Frequency Occasional       TREATMENT Pt entered/exited the pool via steps with single UE support on rail.  50 ft =1 length  Exercises performed in 3'6" depth   2 lengths forward walking for warm-up during history; Standing slow controlled marches x 1 minute; Standing mini squats x 1 minutes; Standing hip abduction bilateral x 1 minute each; Standing hip circles CW/CCW x 20 each; Standing hip extension bilateral x 1 minute each; Standing heel raises x 1 minute each; Seated hamstring stretch with pool noodle assist 30 seconds x 2 bilateral; Seated figure 4 30 seconds x 2 bilateral; Seated flutter kicks x 1 minute; Seated bicycle kicks x  1 minute; Seated scissor kicks x 1 minute; Serpentine mobilizations with patient floating on back x 4 lengths with cues for relaxation which is challenging for patient; Pt instructed in standing gastroc step stretch due to complains of increased calf tightness;  Cues provided during session for proper form/technique. Intensity modified during session according to patient response. Education regarding chronic pain as well as HEP;                            PT Education - 07/27/16 1222    Education provided Yes   Education Details Reinorced importance of HEP and stress management   Person(s) Educated Patient   Methods Explanation    Comprehension Verbalized understanding             PT Long Term Goals - 07/12/16 0943      PT LONG TERM GOAL #1   Title Patient will report a worst pain of 3/10 on VAS in back, thoracic spine and neck to improve tolerance with ADLs and reduced symptoms with activities.    Baseline 5/10   Time 8   Period Weeks   Status New     PT LONG TERM GOAL #2   Title Patient will be able to perform household work/ chores without increase in symptoms.   Time 8   Period Weeks   Status New     PT LONG TERM GOAL #3   Title Patient will tolerate sitting unsupported demonstrating erect sitting posture with minimal thoracic kyphosis for 15+ minutes with maximum of 3/10 back pain to demonstrate improved back extensor strength and improved sitting tolerance   Baseline 5 minutes   Time 8   Period Weeks   Status New     PT LONG TERM GOAL #4   Title Patient will increase six minute walk test distance to >1000 for progression to community ambulator and improve gait ability   Baseline 700 feet   Time 8   Period Weeks   Status New               Plan - 07/27/16 1229    Clinical Impression Statement Pt reports decrease in pain following aquatic therapy on this date. States that her neck, shoulder, and back pain has decreased to a 2/10 at the end of the session. Pt reports some deep R anterior groin pain which radiates into her pelvis. Encouraged pt to discuss with her primary MD. Pt requires intermittent cues throughout PT session for form/technique with exercises. Overall she tolerates exercises well without reported increase in pain or fatigue. Pt will continue to benefit from skilled PT services to address deficits in pain and strength in order to improve function and facilitate return to work environment.    Rehab Potential Fair   Clinical Impairments Affecting Rehab Potential chronic pain   PT Frequency 2x / week   PT Duration 8 weeks   PT Treatment/Interventions Aquatic  Therapy;Electrical Stimulation;Moist Heat;Therapeutic activities;Gait training;Therapeutic exercise;Manual techniques   PT Next Visit Plan aquatic therapy   PT Home Exercise Plan As prescribed   Consulted and Agree with Plan of Care Patient      Patient will benefit from skilled therapeutic intervention in order to improve the following deficits and impairments:  Decreased mobility, Difficulty walking, Pain, Decreased strength  Visit Diagnosis: Difficulty in walking, not elsewhere classified  Left-sided low back pain without sciatica     Problem List Patient Active Problem List   Diagnosis  Date Noted  . Chronic pain 07/20/2016  . Long term current use of opiate analgesic 07/20/2016  . Long term prescription opiate use 07/20/2016  . Opiate use 07/20/2016  . Chronic upper back pain (Location of Secondary source of pain) (Bilateral) (L>R) 07/20/2016  . Chronic shoulder pain (Location of Tertiary source of pain) (Bilateral) (L>R) 07/20/2016  . Chronic low back pain (Bilateral) (L>R) 07/20/2016  . Chronic knee pain (Right) 07/20/2016  . Chronic upper extremity pain (Left) 07/20/2016  . Chronic lower extremity pain (Bilateral) (L>R) 07/20/2016  . Chronic abdominal pain 07/20/2016  . Chronic hip pain (Bilateral) (L>R) 05/31/2016  . Chronic Greater trochanteric bursitis (Bilateral) (L>R) 05/31/2016  . Bursitis of right shoulder 05/17/2016  . Skin lesions 04/27/2016  . Hyperlipidemia 02/18/2016  . Cervical osteoarthritis 02/08/2016  . Fibromyalgia 02/08/2016  . Osteopenia 02/08/2016  . Thoracic back pain 01/28/2016  . Chronic neck pain (Location of Primary Source of Pain) (Bilateral) (L>R) 01/28/2016  . Concentration deficit 01/28/2016  . Lumbar spondylosis 01/06/2016  . Anxiety and depression 07/11/2015  . Chronic pain syndrome 07/11/2015  . Combined fat and carbohydrate induced hyperlipemia 07/11/2015   Phillips Grout PT, DPT   Ayesha Markwell 07/27/2016, 12:42 PM  [place] MAIN [facility] SERVICES [address] [place], [place], [address] Phone: [phone]   Fax:  [phone]  Name: Nicole Walter MRN: [id number] Date of Birth: 02-23-1961

## 2016-07-30 LAB — COMPLIANCE DRUG ANALYSIS, UR

## 2016-07-31 ENCOUNTER — Other Ambulatory Visit: Payer: Self-pay | Admitting: Family Medicine

## 2016-07-31 MED ORDER — GABAPENTIN 300 MG PO CAPS: 300.0000 mg | ORAL_CAPSULE | Freq: Two times a day (BID) | ORAL | 1 refills | Status: DC

## 2016-07-31 NOTE — Telephone Encounter (Signed)
Pt called requesting a refill on gabapentin (NEURONTIN) 300 MG capsule. Thank you!  [facility] [place], [place]  Call pt @ [phone]

## 2016-07-31 NOTE — Telephone Encounter (Signed)
Historical medication. Last seen on 07/14/16. Please advise?

## 2016-07-31 NOTE — Telephone Encounter (Signed)
Refill given

## 2016-08-01 ENCOUNTER — Ambulatory Visit: Payer: BLUE CROSS/BLUE SHIELD | Admitting: Physical Therapy

## 2016-08-01 DIAGNOSIS — R262 Difficulty in walking, not elsewhere classified: Secondary | ICD-10-CM | POA: Diagnosis not present

## 2016-08-01 DIAGNOSIS — M545 Low back pain, unspecified: Secondary | ICD-10-CM

## 2016-08-01 NOTE — Therapy (Signed)
[place] MAIN [facility] SERVICES [address] [place], [place], [address] Phone: [phone]   Fax:  [phone]  Physical Therapy Treatment  Patient Details  Name: Nicole Walter MRN: [id number] Date of Birth: 1961-10-31 No Data Recorded  Encounter Date: 08/01/2016      PT End of Session - 08/01/16 1353    Visit Number 5   Number of Visits 17   Date for PT Re-Evaluation 09/13/16   Authorization Type blue cross blue shield   PT Start Time 0845   PT Stop Time 0930   PT Time Calculation (min) 45 min   Activity Tolerance Patient tolerated treatment well   Behavior During Therapy [facility] for tasks assessed/performed      Past Medical History:  Diagnosis Date  . Abnormal liver enzymes 02/08/2016  . Allergy   . Anxiety   . Chest pain 07/05/2016  . Chronic pain   . Depression   . DJD (degenerative joint disease)    lumbar  . Fibromyalgia   . High cholesterol   . Rash of back 01/28/2016    Past Surgical History:  Procedure Laterality Date  . ANKLE SURGERY     x 2  . CHOLECYSTECTOMY    . COLONOSCOPY    . COLONOSCOPY WITH PROPOFOL N/A 02/22/2016   Procedure: COLONOSCOPY WITH PROPOFOL;  Surgeon: Lollie Sails, MD;  Location: [facility] ENDOSCOPY;  Service: Endoscopy;  Laterality: N/A;  . ESOPHAGOGASTRODUODENOSCOPY (EGD) WITH PROPOFOL N/A 02/22/2016   Procedure: ESOPHAGOGASTRODUODENOSCOPY (EGD) WITH PROPOFOL;  Surgeon: Lollie Sails, MD;  Location: [facility] ENDOSCOPY;  Service: Endoscopy;  Laterality: N/A;  . NASAL SINUS SURGERY    . TONSILLECTOMY    . TUBAL LIGATION      There were no vitals filed for this visit.      Subjective Assessment - 08/01/16 1334    Subjective Pt reported she is still aching from her last aquatic session. Pt feels her R groin area pain may be come from overdoing a leg circle exercise.    Pertinent History lumbar injection 3 months ago, bursa injection 2 months ago, depression, anxiety, fibromyaliga,left ankle  surgery 04/2011 and 11/2011   Limitations Sitting;Lifting;Standing;Walking   How long can you sit comfortably? unable to sit comfortably   How long can you stand comfortably? 15 minutes   How long can you walk comfortably? 15 minutes   Diagnostic tests MRI 2014, MRI 04/2016, MRI 07/2016   Patient Stated Goals Patient wants to be able to get stronger to get back to work   Pain Onset More than a month ago                     Adult Aquatic Therapy - 08/01/16 1342      Aquatic Therapy Subjective   Subjective Pt had no complaints        O: Pt entered/exited the pool via steps with single UE support on rail.  (cued for proper alignment to decrease genu valgus with 45 deg body turn to rail)  50 ft =1 lap  Exercises performed in 3'6" depth   Stretches/ ROM exercises: 3 reps each side   (in the beginning and end of session)  6 directions of neck and spine, Shoulder roll backwards Figure 4 stretch (piriformis ) in mini squat  Hip flexor stretch Hip flexor with twist   1 lap walking forward with shoulder depression cue with monster walking  1 lap backwards  2 laps grapevine B  Lateral side stepping to evoke dancing 2' Exercise interrupted by pt c/o R medial knee pain due to poor propioception. Pt sat down and then was able to continue with rest of session.  2' lateral stepping with less distance from midline and cued foe increased weight bearing on supporting leg. Pt demo'd properly with verbal and tactile cues. Pt had no pain complaint afterwards.   6 directions of neck and spine, Shoulder roll backwards Figure 4 stretch (piriformis ) in mini squat  Hip flexor stretch Hip flexor with twist     10' body scan with noodles support under body, pulled by PT                PT Long Term Goals - 07/12/16 0943      PT LONG TERM GOAL #1   Title Patient will report a worst pain of 3/10 on VAS in back, thoracic spine and neck to improve tolerance with ADLs and  reduced symptoms with activities.    Baseline 5/10   Time 8   Period Weeks   Status New     PT LONG TERM GOAL #2   Title Patient will be able to perform household work/ chores without increase in symptoms.   Time 8   Period Weeks   Status New     PT LONG TERM GOAL #3   Title Patient will tolerate sitting unsupported demonstrating erect sitting posture with minimal thoracic kyphosis for 15+ minutes with maximum of 3/10 back pain to demonstrate improved back extensor strength and improved sitting tolerance   Baseline 5 minutes   Time 8   Period Weeks   Status New     PT LONG TERM GOAL #4   Title Patient will increase six minute walk test distance to >1000 for progression to community ambulator and improve gait ability   Baseline 700 feet   Time 8   Period Weeks   Status New               Plan - 08/01/16 1342    Clinical Impression Statement Pt is progressing well towards her goals with less tense appearance. Pt required cues for shoulder depression and exercises to minimize upper trap overuse. Pt also required cues and propioception training while descending stairs and performing lateral stepping exercise to minimize R knee pain. Pt continues to benefit from skilled PT. Pt reported her R groin pain felt more loose at the end of the session. Pt would benefit from land appt with PT to address her deficits with manual Tx and pt voiced agreement and plans to call and schedule a land appt.    Rehab Potential Fair   Clinical Impairments Affecting Rehab Potential chronic pain   PT Frequency 2x / week   PT Duration 8 weeks   PT Treatment/Interventions Aquatic Therapy;Electrical Stimulation;Moist Heat;Therapeutic activities;Gait training;Therapeutic exercise;Manual techniques   PT Next Visit Plan aquatic therapy   PT Home Exercise Plan As prescribed   Consulted and Agree with Plan of Care Patient      Patient will benefit from skilled therapeutic intervention in order to improve  the following deficits and impairments:  Decreased mobility, Difficulty walking, Pain, Decreased strength  Visit Diagnosis: Difficulty in walking, not elsewhere classified  Left-sided low back pain without sciatica     Problem List Patient Active Problem List   Diagnosis Date Noted  . Chronic pain 07/20/2016  . Long term current use of opiate analgesic 07/20/2016  . Long term prescription opiate use 07/20/2016  .  Opiate use 07/20/2016  . Chronic upper back pain (Location of Secondary source of pain) (Bilateral) (L>R) 07/20/2016  . Chronic shoulder pain (Location of Tertiary source of pain) (Bilateral) (L>R) 07/20/2016  . Chronic low back pain (Bilateral) (L>R) 07/20/2016  . Chronic knee pain (Right) 07/20/2016  . Chronic upper extremity pain (Left) 07/20/2016  . Chronic lower extremity pain (Bilateral) (L>R) 07/20/2016  . Chronic abdominal pain 07/20/2016  . Chronic hip pain (Bilateral) (L>R) 05/31/2016  . Chronic Greater trochanteric bursitis (Bilateral) (L>R) 05/31/2016  . Bursitis of right shoulder 05/17/2016  . Skin lesions 04/27/2016  . Hyperlipidemia 02/18/2016  . Cervical osteoarthritis 02/08/2016  . Fibromyalgia 02/08/2016  . Osteopenia 02/08/2016  . Thoracic back pain 01/28/2016  . Chronic neck pain (Location of Primary Source of Pain) (Bilateral) (L>R) 01/28/2016  . Concentration deficit 01/28/2016  . Lumbar spondylosis 01/06/2016  . Anxiety and depression 07/11/2015  . Chronic pain syndrome 07/11/2015  . Combined fat and carbohydrate induced hyperlipemia 07/11/2015    Jerl Mina 08/01/2016, 1:54 PM  [place] MAIN [facility] SERVICES [address] [place], [place], [address] Phone: [phone]   Fax:  [phone]  Name: Lanee Mazzio MRN: [id number] Date of Birth: [birth year]

## 2016-08-03 ENCOUNTER — Ambulatory Visit: Payer: BLUE CROSS/BLUE SHIELD | Admitting: Physical Therapy

## 2016-08-03 DIAGNOSIS — R262 Difficulty in walking, not elsewhere classified: Secondary | ICD-10-CM

## 2016-08-03 DIAGNOSIS — M545 Low back pain, unspecified: Secondary | ICD-10-CM

## 2016-08-03 NOTE — Progress Notes (Signed)
Normal Vitamin B-12 levels are between 211 and 946 pg/mL, for our Lab. Medical conditions that can increase levels of vitamin B12 include liver disease, kidney failure and myeloproliferative disorders, which includes myelocytic leukemia and polycythemia vera.

## 2016-08-03 NOTE — Progress Notes (Signed)
Normal fasting (NPO x 8 hours) glucose levels are between 65-99 mg/dl, with 2 hour fasting, levels are usually less than 140 mg/dl. Any random blood glucose level greater than 200 mg/dl is considered to be Diabetes.

## 2016-08-03 NOTE — Progress Notes (Signed)
- 

## 2016-08-03 NOTE — Progress Notes (Signed)
Normal levels of C-Reactive Protein for our Lab are less than 1.0 mg/L. C-reactive protein (CRP) is produced by the liver. The level of CRP rises when there is inflammation throughout the body. CRP goes up in response to inflammation. High levels suggests the presence of chronic inflammation but do not identify its location or cause. High levels have been observed in obese patients, individuals with bacterial infections, chronic inflammation, or flare-ups of inflammatory conditions. Drops of previously elevated levels suggest that the inflammation or infection is subsiding and/or responding to treatment.The combined elevation of the ESR & CRP, may be suggestive of an autoimmune disease. Should this be the case, we will inquire if the patient has had a rheumatologic evaluation looking at  the RF levels, ANA levels, and CBC.

## 2016-08-04 NOTE — Therapy (Signed)
[place] MAIN [facility] SERVICES [address] [place], [place], [address] Phone: [phone]   Fax:  [phone]  Physical Therapy Treatment  Patient Details  Name: Nicole Walter MRN: [id number] Date of Birth: 1961-03-28 No Data Recorded  Encounter Date: 08/03/2016      PT End of Session - 08/04/16 2203    Visit Number 6   Number of Visits 17   Date for PT Re-Evaluation 09/13/16   PT Start Time 0845   PT Stop Time 0930   PT Time Calculation (min) 45 min   Activity Tolerance Patient tolerated treatment well;No increased pain   Behavior During Therapy [facility] for tasks assessed/performed      Past Medical History:  Diagnosis Date  . Abnormal liver enzymes 02/08/2016  . Allergy   . Anxiety   . Chest pain 07/05/2016  . Chronic pain   . Depression   . DJD (degenerative joint disease)    lumbar  . Fibromyalgia   . High cholesterol   . Rash of back 01/28/2016    Past Surgical History:  Procedure Laterality Date  . ANKLE SURGERY     x 2  . CHOLECYSTECTOMY    . COLONOSCOPY    . COLONOSCOPY WITH PROPOFOL N/A 02/22/2016   Procedure: COLONOSCOPY WITH PROPOFOL;  Surgeon: Lollie Sails, MD;  Location: [facility] ENDOSCOPY;  Service: Endoscopy;  Laterality: N/A;  . ESOPHAGOGASTRODUODENOSCOPY (EGD) WITH PROPOFOL N/A 02/22/2016   Procedure: ESOPHAGOGASTRODUODENOSCOPY (EGD) WITH PROPOFOL;  Surgeon: Lollie Sails, MD;  Location: [facility] ENDOSCOPY;  Service: Endoscopy;  Laterality: N/A;  . NASAL SINUS SURGERY    . TONSILLECTOMY    . TUBAL LIGATION      There were no vitals filed for this visit.      Subjective Assessment - 08/04/16 2202    Subjective Pt report she did not ache after last session.                      Adult Aquatic Therapy - 08/04/16 2203      Aquatic Therapy Subjective   Subjective Pt had no complaints                         PT Long Term Goals - 07/12/16 0943      PT LONG TERM GOAL #1    Title Patient will report a worst pain of 3/10 on VAS in back, thoracic spine and neck to improve tolerance with ADLs and reduced symptoms with activities.    Baseline 5/10   Time 8   Period Weeks   Status New     PT LONG TERM GOAL #2   Title Patient will be able to perform household work/ chores without increase in symptoms.   Time 8   Period Weeks   Status New     PT LONG TERM GOAL #3   Title Patient will tolerate sitting unsupported demonstrating erect sitting posture with minimal thoracic kyphosis for 15+ minutes with maximum of 3/10 back pain to demonstrate improved back extensor strength and improved sitting tolerance   Baseline 5 minutes   Time 8   Period Weeks   Status New     PT LONG TERM GOAL #4   Title Patient will increase six minute walk test distance to >1000 for progression to community ambulator and improve gait ability   Baseline 700 feet   Time 8   Period Weeks   Status New  Plan - 08/04/16 2204    Clinical Impression Statement Pt is progressing well with new hip stretches. Pt showed good carry over from last sessions's exercise.  Plan to treat pt with land sessions to provide manual Tx and to help pt advance towards her goals.    Rehab Potential Fair   Clinical Impairments Affecting Rehab Potential chronic pain   PT Frequency 2x / week   PT Duration 8 weeks   PT Treatment/Interventions Aquatic Therapy;Electrical Stimulation;Moist Heat;Therapeutic activities;Gait training;Therapeutic exercise;Manual techniques   PT Next Visit Plan aquatic therapy   PT Home Exercise Plan As prescribed   Consulted and Agree with Plan of Care Patient      Patient will benefit from skilled therapeutic intervention in order to improve the following deficits and impairments:  Decreased mobility, Difficulty walking, Pain, Decreased strength  Visit Diagnosis: Difficulty in walking, not elsewhere classified  Left-sided low back pain without  sciatica     Problem List Patient Active Problem List   Diagnosis Date Noted  . Chronic pain 07/20/2016  . Long term current use of opiate analgesic 07/20/2016  . Long term prescription opiate use 07/20/2016  . Opiate use 07/20/2016  . Chronic upper back pain (Location of Secondary source of pain) (Bilateral) (L>R) 07/20/2016  . Chronic shoulder pain (Location of Tertiary source of pain) (Bilateral) (L>R) 07/20/2016  . Chronic low back pain (Bilateral) (L>R) 07/20/2016  . Chronic knee pain (Right) 07/20/2016  . Chronic upper extremity pain (Left) 07/20/2016  . Chronic lower extremity pain (Bilateral) (L>R) 07/20/2016  . Chronic abdominal pain 07/20/2016  . Chronic hip pain (Bilateral) (L>R) 05/31/2016  . Chronic Greater trochanteric bursitis (Bilateral) (L>R) 05/31/2016  . Bursitis of right shoulder 05/17/2016  . Skin lesions 04/27/2016  . Hyperlipidemia 02/18/2016  . Cervical osteoarthritis 02/08/2016  . Fibromyalgia 02/08/2016  . Osteopenia 02/08/2016  . Thoracic back pain 01/28/2016  . Chronic neck pain (Location of Primary Source of Pain) (Bilateral) (L>R) 01/28/2016  . Concentration deficit 01/28/2016  . Lumbar spondylosis 01/06/2016  . Anxiety and depression 07/11/2015  . Chronic pain syndrome 07/11/2015  . Combined fat and carbohydrate induced hyperlipemia 07/11/2015    Jerl Mina ,PT, DPT, E-RYT  08/04/2016, 10:06 PM  [place] MAIN [facility] SERVICES [address] [place], [place], [address] Phone: [phone]   Fax:  [phone]  Name: Yides Argenbright MRN: [id number] Date of Birth: [birth year]

## 2016-08-07 ENCOUNTER — Ambulatory Visit: Payer: BLUE CROSS/BLUE SHIELD | Attending: Internal Medicine | Admitting: Physical Therapy

## 2016-08-07 ENCOUNTER — Other Ambulatory Visit: Payer: Self-pay | Admitting: Family Medicine

## 2016-08-07 DIAGNOSIS — R262 Difficulty in walking, not elsewhere classified: Secondary | ICD-10-CM | POA: Insufficient documentation

## 2016-08-07 DIAGNOSIS — G8929 Other chronic pain: Secondary | ICD-10-CM

## 2016-08-07 DIAGNOSIS — M545 Low back pain: Secondary | ICD-10-CM | POA: Insufficient documentation

## 2016-08-07 NOTE — Therapy (Signed)
[place] MAIN [facility] SERVICES [address] [place], [place], [address] Phone: [phone]   Fax:  [phone]  Physical Therapy Treatment  Patient Details  Name: Nicole Walter MRN: [id number] Date of Birth: 1961-04-13 No Data Recorded  Encounter Date: 08/07/2016      PT End of Session - 08/07/16 0909    Visit Number 7   Number of Visits 17   Date for PT Re-Evaluation 09/13/16   Authorization Type blue cross blue shield   PT Start Time 0815   PT Stop Time 0915   PT Time Calculation (min) 60 min   Activity Tolerance Patient tolerated treatment well;No increased pain   Behavior During Therapy WFL for tasks assessed/performed      Past Medical History:  Diagnosis Date  . Abnormal liver enzymes 02/08/2016  . Allergy   . Anxiety   . Chest pain 07/05/2016  . Chronic pain   . Depression   . DJD (degenerative joint disease)    lumbar  . Fibromyalgia   . High cholesterol   . Rash of back 01/28/2016    Past Surgical History:  Procedure Laterality Date  . ANKLE SURGERY     x 2  . CHOLECYSTECTOMY    . COLONOSCOPY    . COLONOSCOPY WITH PROPOFOL N/A 02/22/2016   Procedure: COLONOSCOPY WITH PROPOFOL;  Surgeon: Lollie Sails, MD;  Location: [facility] ENDOSCOPY;  Service: Endoscopy;  Laterality: N/A;  . ESOPHAGOGASTRODUODENOSCOPY (EGD) WITH PROPOFOL N/A 02/22/2016   Procedure: ESOPHAGOGASTRODUODENOSCOPY (EGD) WITH PROPOFOL;  Surgeon: Lollie Sails, MD;  Location: [facility] ENDOSCOPY;  Service: Endoscopy;  Laterality: N/A;  . NASAL SINUS SURGERY    . TONSILLECTOMY    . TUBAL LIGATION      There were no vitals filed for this visit.      Subjective Assessment - 08/07/16 0858    Subjective Pt reported her R hip and abdominal pain occur with sitting on the floor and leaning forward, after eating, and with abdominal gas. Pt had a tubal ligation surgery, gall bladder / polyps removal. Pt has had test done but nothing showed positive for the  abdomional area except for diverticulosis, acid reflux, and an inflamed duoduem. Pt reported her R hip pain is at a 5/10.    Pertinent History lumbar injection 3 months ago, bursa injection 2 months ago, depression, anxiety, fibromyaliga,left ankle surgery 04/2011 and 11/2011   Limitations Sitting;Lifting;Standing;Walking   How long can you sit comfortably? unable to sit comfortably   How long can you stand comfortably? 15 minutes   How long can you walk comfortably? 15 minutes   Diagnostic tests MRI 2014, MRI 04/2016, MRI 07/2016   Patient Stated Goals Patient wants to be able to get stronger to get back to work   Currently in Pain? Yes   Pain Score 5             [facility] PT Assessment - 08/07/16 0902      Palpation   SI assessment  symmetrical and BLE leg length equal    Palpation comment increased scar adhesions at umbilicus with referred pain to R hip during palpation pulling toward L . increased scar adhesion above umbilicus .  Increased mm tensions at R ischiocavernosus and bulbospongiosus ,   less tenderness at R LQ,no referred pain from umbilical scar      Pt also showed normal hip ext ROM.                [place]  Adult PT Treatment/Exercise - 08/07/16 0906      Manual Therapy   Manual therapy comments STM releases over R ischiocavernosus, bulbospongious, fascial releases over umbilical scars and R LQ of abdomen                 PT Education - 08/07/16 0909    Education provided Yes   Education Details HEP   Person(s) Educated Patient   Methods Explanation;Demonstration;Tactile cues;Verbal cues;Handout   Comprehension Verbalized understanding;Returned demonstration             PT Long Term Goals - 07/12/16 0943      PT LONG TERM GOAL #1   Title Patient will report a worst pain of 3/10 on VAS in back, thoracic spine and neck to improve tolerance with ADLs and reduced symptoms with activities.    Baseline 5/10   Time 8   Period Weeks   Status New      PT LONG TERM GOAL #2   Title Patient will be able to perform household work/ chores without increase in symptoms.   Time 8   Period Weeks   Status New     PT LONG TERM GOAL #3   Title Patient will tolerate sitting unsupported demonstrating erect sitting posture with minimal thoracic kyphosis for 15+ minutes with maximum of 3/10 back pain to demonstrate improved back extensor strength and improved sitting tolerance   Baseline 5 minutes   Time 8   Period Weeks   Status New     PT LONG TERM GOAL #4   Title Patient will increase six minute walk test distance to >1000 for progression to community ambulator and improve gait ability   Baseline 700 feet   Time 8   Period Weeks   Status New               Plan - 08/07/16 0909    Clinical Impression Statement Pt  responded well to manual Tx with decreased R hip from 5/10 to 3/10.  Palpation over umbilical scars referred pain to R hip and she showed decreased scar restrictions today. Pt will continue to benefit from skilled PT to address scar adhesions. Plan to addres her neck pain at next session.     Rehab Potential Fair   Clinical Impairments Affecting Rehab Potential chronic pain   PT Frequency 2x / week   PT Duration 8 weeks   PT Treatment/Interventions Aquatic Therapy;Electrical Stimulation;Moist Heat;Therapeutic activities;Gait training;Therapeutic exercise;Manual techniques   PT Next Visit Plan aquatic therapy   PT Home Exercise Plan As prescribed   Consulted and Agree with Plan of Care Patient      Patient will benefit from skilled therapeutic intervention in order to improve the following deficits and impairments:  Decreased mobility, Difficulty walking, Pain, Decreased strength  Visit Diagnosis: Difficulty in walking, not elsewhere classified  Chronic left-sided low back pain without sciatica     Problem List Patient Active Problem List   Diagnosis Date Noted  . Chronic pain 07/20/2016  . Long term current  use of opiate analgesic 07/20/2016  . Long term prescription opiate use 07/20/2016  . Opiate use 07/20/2016  . Chronic upper back pain (Location of Secondary source of pain) (Bilateral) (L>R) 07/20/2016  . Chronic shoulder pain (Location of Tertiary source of pain) (Bilateral) (L>R) 07/20/2016  . Chronic low back pain (Bilateral) (L>R) 07/20/2016  . Chronic knee pain (Right) 07/20/2016  . Chronic upper extremity pain (Left) 07/20/2016  . Chronic lower extremity pain (Bilateral) (L>R)  07/20/2016  . Chronic abdominal pain 07/20/2016  . Chronic hip pain (Bilateral) (L>R) 05/31/2016  . Chronic Greater trochanteric bursitis (Bilateral) (L>R) 05/31/2016  . Bursitis of right shoulder 05/17/2016  . Skin lesions 04/27/2016  . Hyperlipidemia 02/18/2016  . Cervical osteoarthritis 02/08/2016  . Fibromyalgia 02/08/2016  . Osteopenia 02/08/2016  . Thoracic back pain 01/28/2016  . Chronic neck pain (Location of Primary Source of Pain) (Bilateral) (L>R) 01/28/2016  . Concentration deficit 01/28/2016  . Lumbar spondylosis 01/06/2016  . Anxiety and depression 07/11/2015  . Chronic pain syndrome 07/11/2015  . Combined fat and carbohydrate induced hyperlipemia 07/11/2015    Jerl Mina ,PT, DPT, E-RYT  08/07/2016, 9:16 AM  [place] MAIN [facility] SERVICES [address] [place], [place], [address] Phone: [phone]   Fax:  [phone]  Name: Nicole Walter MRN: [id number] Date of Birth: 1961/05/08

## 2016-08-07 NOTE — Telephone Encounter (Signed)
RX just fill 07/31/16

## 2016-08-07 NOTE — Patient Instructions (Signed)
You are now ready to begin training the deep core muscles system: diaphragm, transverse abdominis, pelvic floor . These muscles must work together as a team.       The key to these exercises to train the brain to coordinate the timing of these muscles and to have them turn on for long periods of time to hold you upright against gravity (especially important if you are on your feet all day).These muscles are postural muscles and play a role stabilizing your spine and bodyweight. By doing these repetitions slowly and correctly instead of doing crunches, you will achieve a flatter belly without a lower pooch. You are also placing your spine in a more neutral position and breathing properly which in turn, decreases your risk for problems related to your pelvic floor, abdominal, and low back such as pelvic organ prolapse, hernias, diastasis recti (separation of superficial muscles), disk herniations, spinal fractures. These exercises set a solid foundation for you to later progress to resistance/ strength training with therabands and weights and return to other typical fitness exercises with a stronger deeper core.  DO level 2 ( 30 reps . X 2 day)

## 2016-08-09 ENCOUNTER — Ambulatory Visit: Payer: BLUE CROSS/BLUE SHIELD | Admitting: Physical Therapy

## 2016-08-11 ENCOUNTER — Telehealth: Payer: Self-pay | Admitting: Family Medicine

## 2016-08-11 NOTE — Telephone Encounter (Signed)
Patient advised to take medication as prescribed per Dr Caryl Bis.  Patient verbalized an understanding.

## 2016-08-11 NOTE — Telephone Encounter (Signed)
Pt called wanting to know can she take the HYDROcodone-acetaminophen (NORCO/VICODIN) 5-325 MG tablet every 4 hours instead of 6 hours due to the pain still there?   Call pt @ [phone]. Thank you!

## 2016-08-11 NOTE — Telephone Encounter (Signed)
Please advise patient to not take it more than every 6 hours. Thanks.

## 2016-08-14 ENCOUNTER — Ambulatory Visit: Payer: BLUE CROSS/BLUE SHIELD | Admitting: Physical Therapy

## 2016-08-14 DIAGNOSIS — G8929 Other chronic pain: Secondary | ICD-10-CM

## 2016-08-14 DIAGNOSIS — R262 Difficulty in walking, not elsewhere classified: Secondary | ICD-10-CM

## 2016-08-14 DIAGNOSIS — M545 Low back pain, unspecified: Secondary | ICD-10-CM

## 2016-08-14 NOTE — Therapy (Addendum)
[place] MAIN [facility] SERVICES [address] [place], [place], [address] Phone: [phone]   Fax:  [phone]  Physical Therapy Treatment  Patient Details  Name: Nicole Walter MRN: [id number] Date of Birth: 09/06/1961 No Data Recorded  Encounter Date: 08/14/2016      PT End of Session - 08/14/16 1004    Visit Number 8   Number of Visits 17   Date for PT Re-Evaluation 09/13/16   Authorization Type blue cross blue shield   PT Start Time 0904   PT Stop Time 1004   PT Time Calculation (min) 60 min   Activity Tolerance Patient tolerated treatment well;No increased pain   Behavior During Therapy WFL for tasks assessed/performed      Past Medical History:  Diagnosis Date  . Abnormal liver enzymes 02/08/2016  . Allergy   . Anxiety   . Chest pain 07/05/2016  . Chronic pain   . Depression   . DJD (degenerative joint disease)    lumbar  . Fibromyalgia   . High cholesterol   . Rash of back 01/28/2016    Past Surgical History:  Procedure Laterality Date  . ANKLE SURGERY     x 2  . CHOLECYSTECTOMY    . COLONOSCOPY    . COLONOSCOPY WITH PROPOFOL N/A 02/22/2016   Procedure: COLONOSCOPY WITH PROPOFOL;  Surgeon: Lollie Sails, MD;  Location: [facility] ENDOSCOPY;  Service: Endoscopy;  Laterality: N/A;  . ESOPHAGOGASTRODUODENOSCOPY (EGD) WITH PROPOFOL N/A 02/22/2016   Procedure: ESOPHAGOGASTRODUODENOSCOPY (EGD) WITH PROPOFOL;  Surgeon: Lollie Sails, MD;  Location: [facility] ENDOSCOPY;  Service: Endoscopy;  Laterality: N/A;  . NASAL SINUS SURGERY    . TONSILLECTOMY    . TUBAL LIGATION      There were no vitals filed for this visit.      Subjective Assessment - 08/14/16 0909    Subjective Pt reported that last session helped with the abdominal massage. Pt notices tightness in the area of the belly button after eating. Pt self-massaged and it helped a little. Pt reports achey neck and lowback/ groin due to the change in weather.    Pertinent  History lumbar injection 3 months ago, bursa injection 2 months ago, depression, anxiety, fibromyaliga,left ankle surgery 04/2011 and 11/2011   Limitations Sitting;Lifting;Standing;Walking   How long can you sit comfortably? unable to sit comfortably   How long can you stand comfortably? 15 minutes   How long can you walk comfortably? 15 minutes   Diagnostic tests MRI 2014, MRI 04/2016, MRI 07/2016   Patient Stated Goals Patient wants to be able to get stronger to get back to work            [facility] PT Assessment - 08/14/16 0952      Palpation   Palpation comment increased scar adhesions w/ referred pain to R groin and lumbar with palpations      Observation: Pt demo'd more upright posture in gait                [facility] Adult PT Treatment/Exercise - 08/14/16 0957      Therapeutic Activites    Therapeutic Activities --   (log rolling) and ( adjustment for low in supine)      Manual Therapy   Manual therapy comments scar releases over umbilical scar                PT Education - 08/14/16 1003    Education provided Yes   Education Details HEP  Person(s) Educated Patient   Methods Explanation;Tactile cues;Demonstration;Handout;Verbal cues   Comprehension Returned demonstration;Verbalized understanding             PT Long Term Goals - 07/12/16 0943      PT LONG TERM GOAL #1   Title Patient will report a worst pain of 3/10 on VAS in back, thoracic spine and neck to improve tolerance with ADLs and reduced symptoms with activities.    Baseline 5/10   Time 8   Period Weeks   Status New     PT LONG TERM GOAL #2   Title Patient will be able to perform household work/ chores without increase in symptoms.   Time 8   Period Weeks   Status New     PT LONG TERM GOAL #3   Title Patient will tolerate sitting unsupported demonstrating erect sitting posture with minimal thoracic kyphosis for 15+ minutes with maximum of 3/10 back pain to demonstrate improved back  extensor strength and improved sitting tolerance   Baseline 5 minutes   Time 8   Period Weeks   Status New     PT LONG TERM GOAL #4   Title Patient will increase six minute walk test distance to >1000 for progression to community ambulator and improve gait ability   Baseline 700 feet   Time 8   Period Weeks   Status New               Plan - 08/14/16 1245    Clinical Impression Statement Pt reported the last session was helpful and showed slightly decreased scar restrictions over the umbilicus. Today she demo'd significantly more decreased scar restrictions over the umbilicus and surrounding area with fascial release techniques. Pt initally reported a sharp pain with palpation around scar area referring to low back and pubic area. Following Tx,  these Sx were not present with palpation over same area which indicates the diminishing scar adhesions related to her pain Sx. Pt showed increased abdominal softness and co-activation of transverse abdominal mm at the end of the treatment which PT anticipates will pt's readiness for postural training. Added thoracic ROM ex into HEP to address her back spasms and to initiate Tx for her neck pain. Pt was educated on ways to adjust her spine to decrease back spasm in positions required for HEP. Pt demo'd properly. Pt will continue to benefit from skilled PT that includes manual Tx.   Rehab Potential Fair   Clinical Impairments Affecting Rehab Potential chronic pain   PT Frequency 2x / week   PT Duration 8 weeks   PT Treatment/Interventions Aquatic Therapy;Electrical Stimulation;Moist Heat;Therapeutic activities;Gait training;Therapeutic exercise;Manual techniques   Consulted and Agree with Plan of Care Patient      Patient will benefit from skilled therapeutic intervention in order to improve the following deficits and impairments:  Decreased mobility, Difficulty walking, Pain, Decreased strength  Visit Diagnosis: Difficulty in walking, not  elsewhere classified  Chronic left-sided low back pain without sciatica     Problem List Patient Active Problem List   Diagnosis Date Noted  . Chronic pain 07/20/2016  . Long term current use of opiate analgesic 07/20/2016  . Long term prescription opiate use 07/20/2016  . Opiate use 07/20/2016  . Chronic upper back pain (Location of Secondary source of pain) (Bilateral) (L>R) 07/20/2016  . Chronic shoulder pain (Location of Tertiary source of pain) (Bilateral) (L>R) 07/20/2016  . Chronic low back pain (Bilateral) (L>R) 07/20/2016  . Chronic knee pain (Right) 07/20/2016  .  Chronic upper extremity pain (Left) 07/20/2016  . Chronic lower extremity pain (Bilateral) (L>R) 07/20/2016  . Chronic abdominal pain 07/20/2016  . Chronic hip pain (Bilateral) (L>R) 05/31/2016  . Chronic Greater trochanteric bursitis (Bilateral) (L>R) 05/31/2016  . Bursitis of right shoulder 05/17/2016  . Skin lesions 04/27/2016  . Hyperlipidemia 02/18/2016  . Cervical osteoarthritis 02/08/2016  . Fibromyalgia 02/08/2016  . Osteopenia 02/08/2016  . Thoracic back pain 01/28/2016  . Chronic neck pain (Location of Primary Source of Pain) (Bilateral) (L>R) 01/28/2016  . Concentration deficit 01/28/2016  . Lumbar spondylosis 01/06/2016  . Anxiety and depression 07/11/2015  . Chronic pain syndrome 07/11/2015  . Combined fat and carbohydrate induced hyperlipemia 07/11/2015    Jerl Mina 08/14/2016, 12:57 PM  [place] MAIN [facility] SERVICES [address] [place], [place], [address] Phone: [phone]   Fax:  [phone]  Name: Kaylaann Viergutz MRN: [id number] Date of Birth: [birth year]

## 2016-08-14 NOTE — Patient Instructions (Addendum)
  To techniques in bed for less back spasms and strain on spine  Adjustment when laying on your back: Pick up hips with knees bent and lower ribs first then the low back and sacrum. The low back should feel less arched    Back stretch: open book  (handout)  Pillow under side belly, between knees, and behind back  15 reps each side with morning stretch routine

## 2016-08-15 ENCOUNTER — Telehealth: Payer: Self-pay | Admitting: Family Medicine

## 2016-08-15 MED ORDER — HYDROCODONE-ACETAMINOPHEN 5-325 MG PO TABS: 1.0000 | ORAL_TABLET | Freq: Four times a day (QID) | ORAL | 0 refills | Status: DC | PRN

## 2016-08-15 NOTE — Telephone Encounter (Signed)
Can we refill this? Patient has follow up appointment with pain clinic 08/31/16

## 2016-08-15 NOTE — Telephone Encounter (Signed)
Pt called about needing a Rx for HYDROcodone-acetaminophen (NORCO/VICODIN) 5-325 MG tablet.   Call pt when it's ready @ [phone]. Thank you!

## 2016-08-15 NOTE — Telephone Encounter (Signed)
Refill given

## 2016-08-15 NOTE — Telephone Encounter (Signed)
Notified patient that RX will be up front for patient to pick up

## 2016-08-23 ENCOUNTER — Ambulatory Visit: Payer: BLUE CROSS/BLUE SHIELD | Admitting: Physical Therapy

## 2016-08-23 DIAGNOSIS — M545 Low back pain: Secondary | ICD-10-CM

## 2016-08-23 DIAGNOSIS — R262 Difficulty in walking, not elsewhere classified: Secondary | ICD-10-CM | POA: Diagnosis not present

## 2016-08-23 DIAGNOSIS — G8929 Other chronic pain: Secondary | ICD-10-CM

## 2016-08-23 NOTE — Therapy (Signed)
[place] MAIN [facility] SERVICES [address] [place], [place], [address] Phone: [phone]   Fax:  [phone]  Physical Therapy Treatment  Patient Details  Name: Nicole Walter MRN: [id number] Date of Birth: 08-Aug-1961 No Data Recorded  Encounter Date: 08/23/2016      PT End of Session - 08/23/16 1653    Visit Number 9   Number of Visits 17   Date for PT Re-Evaluation 09/13/16   Authorization Type blue cross blue shield   PT Start Time 1615   PT Stop Time 1700   PT Time Calculation (min) 45 min   Activity Tolerance Patient tolerated treatment well;No increased pain      Past Medical History:  Diagnosis Date  . Abnormal liver enzymes 02/08/2016  . Allergy   . Anxiety   . Chest pain 07/05/2016  . Chronic pain   . Depression   . DJD (degenerative joint disease)    lumbar  . Fibromyalgia   . High cholesterol   . Rash of back 01/28/2016    Past Surgical History:  Procedure Laterality Date  . ANKLE SURGERY     x 2  . CHOLECYSTECTOMY    . COLONOSCOPY    . COLONOSCOPY WITH PROPOFOL N/A 02/22/2016   Procedure: COLONOSCOPY WITH PROPOFOL;  Surgeon: Lollie Sails, MD;  Location: [facility] ENDOSCOPY;  Service: Endoscopy;  Laterality: N/A;  . ESOPHAGOGASTRODUODENOSCOPY (EGD) WITH PROPOFOL N/A 02/22/2016   Procedure: ESOPHAGOGASTRODUODENOSCOPY (EGD) WITH PROPOFOL;  Surgeon: Lollie Sails, MD;  Location: [facility] ENDOSCOPY;  Service: Endoscopy;  Laterality: N/A;  . NASAL SINUS SURGERY    . TONSILLECTOMY    . TUBAL LIGATION      There were no vitals filed for this visit.      Subjective Assessment - 08/23/16 1621    Subjective Since it rained, pt;s R hip has felt bruised and her R shoudler has been bother her as well.    Pertinent History lumbar injection 3 months ago, bursa injection 2 months ago, depression, anxiety, fibromyaliga,left ankle surgery 04/2011 and 11/2011   Limitations Sitting;Lifting;Standing;Walking   How long can you sit  comfortably? unable to sit comfortably   How long can you stand comfortably? 15 minutes   How long can you walk comfortably? 15 minutes   Diagnostic tests MRI 2014, MRI 04/2016, MRI 07/2016   Patient Stated Goals Patient wants to be able to get stronger to get back to work            [facility] PT Assessment - 08/23/16 1650      Observation/Other Assessments   Observations decreased mm tensions at upper trap/ back     Palpation   Palpation comment decreased scar adhesions compared to last session,  at umbilicus with referred pain to back but not groin     Ambulation/Gait   Gait Comments increased arm swing                     [facility] Adult PT Treatment/Exercise - 08/23/16 1652      Therapeutic Activites    Therapeutic Activities --  gait training     Moist Heat Therapy   Number Minutes Moist Heat 5 Minutes   Moist Heat Location Hip;Other (comment)     Manual Therapy   Manual therapy comments scar releases over umbilical scar                PT Education - 08/23/16 1653    Education provided Yes  Education Details HEP   Person(s) Educated Patient   Methods Explanation;Demonstration;Tactile cues;Verbal cues   Comprehension Returned demonstration;Verbalized understanding             PT Long Term Goals - 07/12/16 0943      PT LONG TERM GOAL #1   Title Patient will report a worst pain of 3/10 on VAS in back, thoracic spine and neck to improve tolerance with ADLs and reduced symptoms with activities.    Baseline 5/10   Time 8   Period Weeks   Status New     PT LONG TERM GOAL #2   Title Patient will be able to perform household work/ chores without increase in symptoms.   Time 8   Period Weeks   Status New     PT LONG TERM GOAL #3   Title Patient will tolerate sitting unsupported demonstrating erect sitting posture with minimal thoracic kyphosis for 15+ minutes with maximum of 3/10 back pain to demonstrate improved back extensor strength and  improved sitting tolerance   Baseline 5 minutes   Time 8   Period Weeks   Status New     PT LONG TERM GOAL #4   Title Patient will increase six minute walk test distance to >1000 for progression to community ambulator and improve gait ability   Baseline 700 feet   Time 8   Period Weeks   Status New               Plan - 08/23/16 1654    Clinical Impression Statement Pt continues to show decreased scar adhesions over umbilicus with only referred to back with lateral fascial pulling in L direction. Pt was trained on increasing hip extension in gait and demo'd more trunk rotation. Pt continues to require skilled PT.  Pt's frequency of visit has decreased to 1x/ week.    Rehab Potential Fair   Clinical Impairments Affecting Rehab Potential chronic pain   PT Frequency 1x / week   PT Duration 8 weeks   PT Treatment/Interventions Aquatic Therapy;Electrical Stimulation;Moist Heat;Therapeutic activities;Gait training;Therapeutic exercise;Manual techniques   Consulted and Agree with Plan of Care Patient      Patient will benefit from skilled therapeutic intervention in order to improve the following deficits and impairments:  Decreased mobility, Difficulty walking, Pain, Decreased strength  Visit Diagnosis: Difficulty in walking, not elsewhere classified  Chronic left-sided low back pain without sciatica     Problem List Patient Active Problem List   Diagnosis Date Noted  . Chronic pain 07/20/2016  . Long term current use of opiate analgesic 07/20/2016  . Long term prescription opiate use 07/20/2016  . Opiate use 07/20/2016  . Chronic upper back pain (Location of Secondary source of pain) (Bilateral) (L>R) 07/20/2016  . Chronic shoulder pain (Location of Tertiary source of pain) (Bilateral) (L>R) 07/20/2016  . Chronic low back pain (Bilateral) (L>R) 07/20/2016  . Chronic knee pain (Right) 07/20/2016  . Chronic upper extremity pain (Left) 07/20/2016  . Chronic lower  extremity pain (Bilateral) (L>R) 07/20/2016  . Chronic abdominal pain 07/20/2016  . Chronic hip pain (Bilateral) (L>R) 05/31/2016  . Chronic Greater trochanteric bursitis (Bilateral) (L>R) 05/31/2016  . Bursitis of right shoulder 05/17/2016  . Skin lesions 04/27/2016  . Hyperlipidemia 02/18/2016  . Cervical osteoarthritis 02/08/2016  . Fibromyalgia 02/08/2016  . Osteopenia 02/08/2016  . Thoracic back pain 01/28/2016  . Chronic neck pain (Location of Primary Source of Pain) (Bilateral) (L>R) 01/28/2016  . Concentration deficit 01/28/2016  . Lumbar spondylosis  01/06/2016  . Anxiety and depression 07/11/2015  . Chronic pain syndrome 07/11/2015  . Combined fat and carbohydrate induced hyperlipemia 07/11/2015    Jerl Mina ,PT, DPT, E-RYT  08/23/2016, 4:58 PM  [place] MAIN [facility] SERVICES [address] [place], [place], [address] Phone: [phone]   Fax:  [phone]  Name: Nicole Walter MRN: [id number] Date of Birth: 1961/04/02

## 2016-08-31 ENCOUNTER — Ambulatory Visit
Admission: RE | Admit: 2016-08-31 | Discharge: 2016-08-31 | Disposition: A | Discharge status: Home / Self Care | Payer: BLUE CROSS/BLUE SHIELD | Source: Ambulatory Visit | Attending: Pain Medicine | Admitting: Pain Medicine

## 2016-08-31 ENCOUNTER — Other Ambulatory Visit
Admission: RE | Admit: 2016-08-31 | Discharge: 2016-08-31 | Disposition: A | Discharge status: Home / Self Care | Payer: BLUE CROSS/BLUE SHIELD | Source: Ambulatory Visit | Attending: Pain Medicine | Admitting: Pain Medicine

## 2016-08-31 ENCOUNTER — Ambulatory Visit: Payer: BLUE CROSS/BLUE SHIELD | Attending: Pain Medicine | Admitting: Pain Medicine

## 2016-08-31 ENCOUNTER — Encounter: Payer: Self-pay | Admitting: Pain Medicine

## 2016-08-31 VITALS — BP 109/59 | HR 64 | Temp 98.2°F | Resp 16 | Ht 64.0 in | Wt 172.0 lb

## 2016-08-31 DIAGNOSIS — F419 Anxiety disorder, unspecified: Secondary | ICD-10-CM | POA: Insufficient documentation

## 2016-08-31 DIAGNOSIS — Z79899 Other long term (current) drug therapy: Secondary | ICD-10-CM | POA: Diagnosis not present

## 2016-08-31 DIAGNOSIS — M797 Fibromyalgia: Secondary | ICD-10-CM | POA: Insufficient documentation

## 2016-08-31 DIAGNOSIS — M25552 Pain in left hip: Secondary | ICD-10-CM | POA: Insufficient documentation

## 2016-08-31 DIAGNOSIS — R109 Unspecified abdominal pain: Secondary | ICD-10-CM | POA: Insufficient documentation

## 2016-08-31 DIAGNOSIS — Z9049 Acquired absence of other specified parts of digestive tract: Secondary | ICD-10-CM | POA: Insufficient documentation

## 2016-08-31 DIAGNOSIS — M160 Bilateral primary osteoarthritis of hip: Secondary | ICD-10-CM | POA: Diagnosis not present

## 2016-08-31 DIAGNOSIS — F329 Major depressive disorder, single episode, unspecified: Secondary | ICD-10-CM | POA: Diagnosis not present

## 2016-08-31 DIAGNOSIS — M5126 Other intervertebral disc displacement, lumbar region: Secondary | ICD-10-CM | POA: Diagnosis not present

## 2016-08-31 DIAGNOSIS — G8929 Other chronic pain: Secondary | ICD-10-CM

## 2016-08-31 DIAGNOSIS — M47812 Spondylosis without myelopathy or radiculopathy, cervical region: Secondary | ICD-10-CM | POA: Insufficient documentation

## 2016-08-31 DIAGNOSIS — M25512 Pain in left shoulder: Secondary | ICD-10-CM

## 2016-08-31 DIAGNOSIS — M25551 Pain in right hip: Secondary | ICD-10-CM | POA: Insufficient documentation

## 2016-08-31 DIAGNOSIS — M25511 Pain in right shoulder: Secondary | ICD-10-CM | POA: Diagnosis not present

## 2016-08-31 DIAGNOSIS — Z9889 Other specified postprocedural states: Secondary | ICD-10-CM | POA: Diagnosis not present

## 2016-08-31 DIAGNOSIS — R700 Elevated erythrocyte sedimentation rate: Secondary | ICD-10-CM

## 2016-08-31 DIAGNOSIS — M8580 Other specified disorders of bone density and structure, unspecified site: Secondary | ICD-10-CM | POA: Insufficient documentation

## 2016-08-31 DIAGNOSIS — M48061 Spinal stenosis, lumbar region without neurogenic claudication: Secondary | ICD-10-CM | POA: Insufficient documentation

## 2016-08-31 DIAGNOSIS — Z87891 Personal history of nicotine dependence: Secondary | ICD-10-CM | POA: Diagnosis not present

## 2016-08-31 DIAGNOSIS — M542 Cervicalgia: Secondary | ICD-10-CM

## 2016-08-31 DIAGNOSIS — M25561 Pain in right knee: Secondary | ICD-10-CM | POA: Diagnosis not present

## 2016-08-31 DIAGNOSIS — M25559 Pain in unspecified hip: Principal | ICD-10-CM

## 2016-08-31 DIAGNOSIS — E7801 Familial hypercholesterolemia: Secondary | ICD-10-CM | POA: Diagnosis not present

## 2016-08-31 DIAGNOSIS — R7982 Elevated C-reactive protein (CRP): Secondary | ICD-10-CM | POA: Diagnosis not present

## 2016-08-31 DIAGNOSIS — M5114 Intervertebral disc disorders with radiculopathy, thoracic region: Secondary | ICD-10-CM | POA: Insufficient documentation

## 2016-08-31 DIAGNOSIS — M549 Dorsalgia, unspecified: Secondary | ICD-10-CM | POA: Diagnosis not present

## 2016-08-31 MED ORDER — HYDROCODONE-ACETAMINOPHEN 5-325 MG PO TABS: 1.0000 | ORAL_TABLET | Freq: Four times a day (QID) | ORAL | 0 refills | Status: DC | PRN

## 2016-08-31 NOTE — Progress Notes (Signed)
Safety precautions to be maintained throughout the outpatient stay will include: orient to surroundings, keep bed in low position, maintain call bell within reach at all times, provide assistance with transfer out of bed and ambulation.  

## 2016-08-31 NOTE — Progress Notes (Signed)
Patient's Name: Nicole Walter  MRN: [id number]  Referring Provider: Leone Haven, MD  DOB: January 04, 1961  PCP: Tommi Rumps, MD  DOS: 08/31/2016  Note by: Kathlen Brunswick. Dossie Arbour, MD  Service setting: Ambulatory outpatient  Specialty: Interventional Pain Management  Location: [facility] (AMB) Pain Management Facility    Patient type: Established   Primary Reason(s) for Visit: Encounter for evaluation before starting new chronic pain management plan of care (Level of risk: moderate) CC: Hip Pain (right) and Neck Pain (both sides of neck goes down the back out to the shoulders, more on the right)  HPI  Nicole Walter is a 55 y.o. year old, female patient, who comes today for an initial evaluation. She has Anxiety and depression; Chronic pain syndrome; Lumbar spondylosis; Combined fat and carbohydrate induced hyperlipemia; Thoracic back pain; Chronic neck pain (Location of Primary Source of Pain) (Bilateral) (L>R); Concentration deficit; Hyperlipidemia; Cervical osteoarthritis; Fibromyalgia; Osteopenia; Skin lesions; Chronic hip pain (Bilateral) (L>R); Bursitis of right shoulder; Chronic Greater trochanteric bursitis (Bilateral) (L>R); Chronic pain; Long term current use of opiate analgesic; Long term prescription opiate use; Opiate use; Chronic upper back pain (Location of Secondary source of pain) (Bilateral) (L>R); Chronic shoulder pain (Location of Tertiary source of pain) (Bilateral) (L>R); Chronic low back pain (Bilateral) (L>R); Chronic knee pain (Right); Chronic upper extremity pain (Left); Chronic lower extremity pain (Bilateral) (L>R); Chronic abdominal pain; Elevated sedimentation rate; and Elevated C-reactive protein (CRP) on her problem list.. Her primarily concern today is the Hip Pain (right) and Neck Pain (both sides of neck goes down the back out to the shoulders, more on the right)  Pain Assessment: Self-Reported Pain Score: 5 /10             Reported level is compatible with observation.        Pain Type: Chronic pain Pain Location: Hip Pain Orientation: Right Pain Descriptors / Indicators: Aching, Constant, Sharp, Throbbing Pain Frequency: Constant  Nicole Walter comes in today for a follow-up visit after her initial evaluation on 07/20/2016. Today we went over the results of her tests. These were explained in "Layman's terms". During today's appointment we went over my diagnostic impression, as well as the proposed treatment plan.  In considering the treatment plan options, Ms. Buck was reminded that I no longer take patients for medication management only. I asked her to let me know if she had no intention of taking advantage of the interventional therapies, so that we could make arrangements to provide this space to someone interested. I also made it clear that undergoing interventional therapies for the purpose of getting pain medications is very inappropriate on the part of a patient, and it will not be tolerated in this practice. This type of behavior would suggest true addiction and therefore it requires referral to an addiction specialist.   Further details on both, my assessment(s), as well as the proposed treatment plan, please see below. Controlled Substance Pharmacotherapy Assessment REMS (Risk Evaluation and Mitigation Strategy)  Analgesic: Hydrocodone/APAP 5/325 one tablet by mouth 3 times a day (15 mg/day of hydrocodone) MME/day: 15 mg/day Pill Count: None expected due to no prior prescriptions written by our practice. Pharmacokinetics: Liberation and absorption (onset of action): WNL Distribution (time to peak effect): WNL  Metabolism and excretion (duration of action): WNL         Pharmacodynamics: Desired effects: Analgesia: The patient reports >50% benefit. Reported improvement in function: The patient reports medication allows her to accomplish basic ADLs. Clinically meaningful improvement in function (  CMIF): Sustained CMIF goals met Perceived effectiveness:  Described as relatively effective, allowing for increase in activities of daily living (ADL) Undesirable effects: Side-effects or Adverse reactions: None reported Monitoring: [place] PMP: Online review of the past 78-monthperiod previously conducted. Not applicable at this point since we have not taken over the patient's medication management yet. List of all UDS test(s) done:  Lab Results  Component Value Date   SUMMARY FINAL 07/20/2016   Last UDS on record: Summary  Date Value Ref Range Status  07/20/2016 FINAL  Final    Comment:    ==================================================================== [facility]RE COMP DRUG ANALYSIS,UR ==================================================================== Test                             Result       Flag       Units Drug Present and Declared for Prescription Verification   Hydrocodone                    459          EXPECTED   ng/mg creat   Hydromorphone                  104          EXPECTED   ng/mg creat   Dihydrocodeine                 130          EXPECTED   ng/mg creat   Norhydrocodone                 1283         EXPECTED   ng/mg creat    Sources of hydrocodone include scheduled prescription    medications. Hydromorphone, dihydrocodeine and norhydrocodone are    expected metabolites of hydrocodone. Hydromorphone and    dihydrocodeine are also available as scheduled prescription    medications.   Gabapentin                     PRESENT      EXPECTED   Duloxetine                     PRESENT      EXPECTED   Trazodone                      PRESENT      EXPECTED   1,3 chlorophenyl piperazine    PRESENT      EXPECTED    1,3-chlorophenyl piperazine is an expected metabolite of    trazodone.   Aripiprazole                   PRESENT      EXPECTED   Acetaminophen                  PRESENT      EXPECTED ==================================================================== Test                      Result    Flag   Units      Ref Range    Creatinine              185              mg/dL      >=20 ==================================================================== Declared Medications:  The flagging and interpretation on this report are  based on the  following declared medications.  Unexpected results may arise from  inaccuracies in the declared medications.  **Note: The testing scope of this panel includes these medications:  Aripiprazole (Abilify)  Duloxetine (Cymbalta)  Gabapentin (Neurontin)  Hydrocodone (Norco)  Trazodone (Desyrel)  **Note: The testing scope of this panel does not include small to  moderate amounts of these reported medications:  Acetaminophen (Norco)  **Note: The testing scope of this panel does not include following  reported medications:  Fluticasone (Flonase)  Multivitamin  Rabeprazole (Aciphex)  Simvastatin (Zocor)  Sucralfate (Carafate)  Topical (Lidex)  Vitamin D ==================================================================== For clinical consultation, please call [phone]. ====================================================================    UDS interpretation: No unexpected findings.          Medication Assessment Form: Patient introduced to form today Treatment compliance: Treatment may start today if patient agrees with proposed plan. Evaluation of compliance is not applicable at this point Risk Assessment Profile: Aberrant behavior: See prior evaluations. None observed or detected today Comorbid factors increasing risk of overdose: See prior notes. No additional risks detected today Risk Mitigation Strategies:  Patient opioid safety counseling: Completed today. Counseling provided to patient as per "Patient Counseling Document". Document signed by patient, attesting to counseling and understanding Patient-Prescriber Agreement (PPA): Obtained today  Controlled substance notification to other providers: Written and sent today  Pharmacologic Plan: Today we may be taking  over the patient's pharmacological regimen. See below  Laboratory Chemistry  Inflammation Markers Lab Results  Component Value Date   ESRSEDRATE 37 (H) 07/20/2016   CRP 2.1 (H) 07/20/2016   Renal Function Lab Results  Component Value Date   BUN 13 07/20/2016   CREATININE 0.85 07/20/2016   GFRAA >60 07/20/2016   GFRNONAA >60 07/20/2016   Hepatic Function Lab Results  Component Value Date   AST 31 07/20/2016   ALT 40 07/20/2016   ALBUMIN 4.5 07/20/2016   Electrolytes Lab Results  Component Value Date   NA 141 07/20/2016   K 4.1 07/20/2016   CL 104 07/20/2016   CALCIUM 9.6 07/20/2016   MG 2.1 07/20/2016   Pain Modulating Vitamins Lab Results  Component Value Date   25OHVITD1 58 07/20/2016   25OHVITD2 <1.0 07/20/2016   25OHVITD3 58 07/20/2016   VITAMINB12 987 (H) 07/20/2016   Coagulation Parameters Lab Results  Component Value Date   PLT 256 07/05/2016   Cardiovascular Lab Results  Component Value Date   HGB 11.9 (L) 07/05/2016   HCT 34.6 (L) 07/05/2016   Note: Lab results reviewed.  Recent Diagnostic Imaging Review  Cervical Imaging: Cervical MR wo contrast:  Results for orders placed during the hospital encounter of 03/02/16  MR Cervical Spine Wo Contrast   Narrative CLINICAL DATA:  Neck and bilateral arm pain since 2014. No recent injury. Subsequent encounter.  EXAM: MRI CERVICAL SPINE WITHOUT CONTRAST  TECHNIQUE: Multiplanar, multisequence MR imaging of the cervical spine was performed. No intravenous contrast was administered.  COMPARISON:  MRI cervical spine 12/31/2012.  FINDINGS: Vertebral body height, signal and alignment are maintained. The craniocervical junction is normal and cervical cord signal is normal. Imaged paraspinous structures are unremarkable.  C2-3:  Negative.  C3-4: Tiny central protrusion without central canal or foraminal narrowing, unchanged.  C4-5: Minimal posterior bony ridging without central canal  or foraminal stenosis.  C5-6: Shallow disc bulge and left worse than right uncovertebral disease. The ventral thecal sac is nearly effaced. Moderate to moderately severe foraminal narrowing is worse on the left. There has been some progression  of disease at this level.  C6-7: Very shallow disc bulge is identified and there is some uncovertebral disease. The ventral thecal sac is narrowed but not effaced. The foramina remain open. The appearance is unchanged.  C7-T1:  Negative.  IMPRESSION: Some progression spondylosis at C5-6 where there is a disc bulge and uncovertebral disease. The ventral thecal sac is nearly effaced at this level and there is moderate to moderately severe foraminal narrowing, worse on the left.  No notable change in a shallow disc bulge at C6-7 which narrows but does not efface the ventral thecal sac. The foramina are open.   Electronically Signed   By: Inge Rise M.D.   On: 03/02/2016 09:31    Cervical MR wo contrast:  Results for orders placed in visit on 12/31/12  MR [facility] W/O Cm   Narrative * PRIOR REPORT IMPORTED FROM AN EXTERNAL SYSTEM *   PRIOR REPORT IMPORTED FROM THE [facility] [place] EXAM:    Neck pain radiating to shoulder  COMMENTS:   PROCEDURE:     MR  - MR CERVICAL SPINE WO CONT  - Dec 31 2012  8:08AM   RESULT:     History: Neck pain radiating to shoulder.   Comparison Study: No recent.   Findings: Multiplanar, multisequence imaging of the cervical spine  obtained.  C5-C6 small central disc protrusion is present. Similar finding noted at  C6-C7. Neural foramen are patent. Cervical cord is unremarkable. Cranial  vertebral junction is normal. No bony abnormalities.   IMPRESSION:      Multilevel mild disc degeneration. No evidence of  significant disc protrusion, spinal stenosis or neural foraminal  narrowing.  Tiny central disc protrusions are present at C5-C6 and C6-C7 without  spinal  stenosis.        Thoracic Imaging: Thoracic MR wo contrast:  Results for orders placed during the hospital encounter of 07/11/16  MR Thoracic Spine Wo Contrast   Narrative CLINICAL DATA:  55 year old female with mid and upper back pain radiating laterally and to the scapulae. Thoracic radiculitis. Subsequent encounter.  EXAM: MRI THORACIC SPINE WITHOUT CONTRAST  TECHNIQUE: Multiplanar, multisequence MR imaging of the thoracic spine was performed. No intravenous contrast was administered.  COMPARISON:  Thoracic spine MRI 09/05/2013.  FINDINGS: Limited sagittal imaging of the cervical spine appears stable to that in 2014.  Alignment: Stable mild levoconvex thoracic scoliosis and straightening of thoracic kyphosis.  Vertebrae: No marrow edema or evidence of acute osseous abnormality.  Cord: Spinal cord signal is grossly normal limits at all visualized levels. The conus medullaris appears normal at T12-L1.  Paraspinal and other soft tissues: Negative visualized thoracic and upper abdominal viscera. Visible posterior and lateral chest wall soft tissues appear normal. No posterior rib abnormality identified.  Disc levels:  T1-T2: Moderate facet hypertrophy. Moderate to severe T1 foraminal stenosis. This level is stable.  T2-T3: Right eccentric disc bulge. Uncovertebral hypertrophy. Moderate facet hypertrophy. This level has progressed, the disc disease is new. Severe right T2 foraminal stenosis appears stable to mildly increased.  T3-T4: Right eccentric disc bulge and endplate spurring. Moderate right facet hypertrophy. Moderate right T3 foraminal stenosis. This level is stable.  T4-T5: Mild disc bulge. Mild facet hypertrophy. Mild endplate spurring. This level is mildly progressed but there is no stenosis.  T5-T6: Chronic small central to slightly left paracentral disc protrusion. Mild facet hypertrophy. No stenosis. This level is stable.  T6-T7: Chronic small central disc  protrusion. Mild right facet hypertrophy.  This level is stable with no stenosis.  T7-T8: Chronic small right paracentral disc protrusion. Underlying circumferential disc bulge appears increased. Moderate facet hypertrophy appears increased. However, there is no stenosis.  T8-T9: Moderate facet hypertrophy. This level is stable with no stenosis.  T9-T10: Moderate facet hypertrophy greater on the left. Mild bilateral T9 foraminal stenosis. This level is stable.  T10-T11: Moderate to severe facet hypertrophy is chronic. Chronic left far lateral disc bulge and endplate spurring. Stable mild T10 foraminal stenosis. This level is stable.  T11-T12: Mild to moderate facet hypertrophy. Stable mild left T11 foraminal stenosis. This level is stable.  T12-L1:  Negative.  IMPRESSION: 1. Mild progression of chronic thoracic spine degeneration since 2014 at T2-T3, T4-T5, and the T7-T8 levels. However, of these there is only stenosis at T2-T3 where right severe T2 neural foraminal stenosis appears increased. 2. Small thoracic disc herniations at T5-T6 through T7-T8 are stable with no spinal stenosis. No thoracic spinal cord abnormality identified. 3. Stable moderate or severe neural foraminal stenosis at the bilateral T1 and right T3 nerve levels. Stable mild bilateral T9, T10, and left T11 foraminal stenosis. 4. No acute osseous abnormality identified. Mild levoconvex scoliosis.   Electronically Signed   By: Genevie Ann M.D.   On: 07/11/2016 15:28    Thoracic MR wo contrast:  Results for orders placed in visit on 09/05/13  MR [place] W/O Cm   Narrative * PRIOR REPORT IMPORTED FROM AN EXTERNAL SYSTEM *   PRIOR REPORT IMPORTED FROM THE [facility] [place] EXAM:    bilateral thoracic pain  COMMENTS:   PROCEDURE:     MR  - MR THORACIC SPINE WO  - Sep 05 2013  9:31AM   RESULT:     History: Thoracic pain.   Comparison Study: No prior.   Findings: Multiplanar,  multisequence imaging of the a thoracic spine is  obtained. No focal bony abnormality identified. Thoracic cord is  intrinsically normal. T7-T8 right paracentral disc protrusion is present.  This is small. There is minimal narrowing of the adjacent neural foramen  and  minimal deformity thecal sac. No other significant disc protrusions are  noted.   IMPRESSION:      T7-T8 right paracentral disc protrusion as described  above.       Lumbosacral Imaging: Lumbar MR wo contrast:  Results for orders placed during the hospital encounter of 03/02/16  MR Lumbar Spine Wo Contrast   Narrative CLINICAL DATA:  Twisting injury 7 months ago with persistent low back and bilateral leg pain, worse on the left. No previous relevant surgery.  EXAM: MRI LUMBAR SPINE WITHOUT CONTRAST  TECHNIQUE: Multiplanar, multisequence MR imaging of the lumbar spine was performed. No intravenous contrast was administered.  COMPARISON:  Abdominal pelvic CT 12/20/2015  FINDINGS: Segmentation: Conventional anatomy assumed, with the last open disc space designated L5-S1.  Alignment:  Normal.  Vertebrae: No worrisome osseous lesion, acute fracture or pars defect. The lumbar pedicles are somewhat short on a congenital basis. The visualized sacroiliac joints appear unremarkable.  Conus medullaris: Extends to the L1 level and appears normal.  Paraspinal and other soft tissues: No significant paraspinal findings.  Disc levels:  There are no significant disc space findings from T11-12 through L1-2.  L2-3: Stable loss of disc height with mild annular disc bulging. No spinal stenosis or nerve root encroachment.  L3-4: Stable loss of disc height with annular disc bulging and a shallow left paracentral disc protrusion. There is mild  facet and ligamentous hypertrophy. These factors contribute to asymmetric narrowing of the left lateral recess and potential left L4 nerve root encroachment. Both foramina are  mildly narrowed without definite L3 nerve root encroachment.  L4-5: Disc height is relatively maintained. There is mild disc bulging with moderate facet and ligamentous hypertrophy. These factors contribute to mild narrowing of the lateral recesses and foramina bilaterally. There is no nerve root encroachment.  L5-S1: Disc height and hydration are maintained. There is mild disc bulging and facet hypertrophy. No spinal stenosis or nerve root encroachment.  IMPRESSION: 1. No acute findings, high-grade spinal stenosis or definite nerve root encroachment. 2. At L3-4, there is disc bulging and a shallow left paracentral disc protrusion, contributing to asymmetric narrowing of the left lateral recess and potential left L4 nerve root encroachment. 3. Moderate facet and ligamentous hypertrophy at L4-5.   Electronically Signed   By: Richardean Sale M.D.   On: 03/02/2016 09:10    Note: Imaging results reviewed.  Meds  The patient has a current medication list which includes the following prescription(s): vitamin d, fluocinonide ointment, fluticasone, gabapentin, hydrocodone-acetaminophen, hydroxyzine, multi-vitamins, rabeprazole, simvastatin, sucralfate, tizanidine, and trazodone.  Current Outpatient Prescriptions on File Prior to Visit  Medication Sig  . Cholecalciferol (VITAMIN D) 2000 units tablet Take 4,000 Units by mouth daily.  . fluocinonide ointment (LIDEX) 0.05 % APPLY TOPICALLY TWICE A DAY TO ITCHY AREAS ONLY. as needed  . fluticasone (FLONASE) 50 MCG/ACT nasal spray Place 2 sprays into both nostrils daily.   Marland Kitchen gabapentin (NEURONTIN) 300 MG capsule Take 1 capsule (300 mg total) by mouth 2 (two) times daily. (Patient taking differently: Take 300 mg by mouth daily. )  . Multiple Vitamin (MULTI-VITAMINS) TABS Take by mouth daily.   . RABEprazole (ACIPHEX) 20 MG tablet Take 20 mg by mouth daily.   . simvastatin (ZOCOR) 20 MG tablet take 1 tablet by mouth at bedtime  . sucralfate  (CARAFATE) 1 g tablet take 1 tablet by mouth three times a day if needed for abdominal pain  . tiZANidine (ZANAFLEX) 2 MG tablet 1 tablet 2-3 times a day only as needed for severe muscles spasm.  . traZODone (DESYREL) 50 MG tablet 2 TABLETS by mouth at bedtime   No current facility-administered medications on file prior to visit.    ROS  Constitutional: Denies any fever or chills Gastrointestinal: No reported hemesis, hematochezia, vomiting, or acute GI distress Musculoskeletal: Denies any acute onset joint swelling, redness, loss of ROM, or weakness Neurological: No reported episodes of acute onset apraxia, aphasia, dysarthria, agnosia, amnesia, paralysis, loss of coordination, or loss of consciousness  Allergies  Ms. Blinn is allergic to food and other.  PFSH  Drug: Ms. Hamblin  reports that she does not use drugs. Alcohol:  reports that she does not drink alcohol. Tobacco:  reports that she quit smoking about 13 months ago. She started smoking about 42 years ago. She uses smokeless tobacco. Medical:  has a past medical history of Abnormal liver enzymes (02/08/2016); Allergy; Anxiety; Chest pain (07/05/2016); Chronic pain; Depression; DJD (degenerative joint disease); Fibromyalgia; High cholesterol; and Rash of back (01/28/2016). Family: family history includes Arthritis in her father, paternal grandfather, and paternal grandmother; Cancer in her brother and father; Dementia in her sister; Diabetes in her sister; Drug abuse in her brother, brother, and sister; Heart disease in her mother; Post-traumatic stress disorder in her brother; Stroke in her mother.  Past Surgical History:  Procedure Laterality Date  . ANKLE SURGERY  x 2  . CHOLECYSTECTOMY    . COLONOSCOPY    . COLONOSCOPY WITH PROPOFOL N/A 02/22/2016   Procedure: COLONOSCOPY WITH PROPOFOL;  Surgeon: Lollie Sails, MD;  Location[facility]COPY;  Service: Endoscopy;  Laterality: N/A;  . ESOPHAGOGASTRODUODENOSCOPY (EGD) WITH  PROPOFOL N/A 02/22/2016   Procedure: ESOPHAGOGASTRODUODENOSCOPY (EGD) WITH PROPOFOL;  Surgeon: Lollie Sails, MD;  Location: [facility] ENDOSCOPY;  Service: Endoscopy;  Laterality: N/A;  . NASAL SINUS SURGERY    . TONSILLECTOMY    . TUBAL LIGATION     Constitutional Exam  General appearance: Well nourished, well developed, and well hydrated. In no apparent acute distress Vitals:   08/31/16 0821  BP: (!) 109/59  Pulse: 64  Resp: 16  Temp: 98.2 F (36.8 C)  SpO2: 98%  Weight: 172 lb (78 kg)  Height: '5\' 4"'  (1.626 m)   BMI Assessment: Estimated body mass index is 29.52 kg/m as calculated from the following:   Height as of this encounter: '5\' 4"'  (1.626 m).   Weight as of this encounter: 172 lb (78 kg).  BMI interpretation table: BMI level Category Range association with higher incidence of chronic pain  <18 kg/m2 Underweight   18.5-24.9 kg/m2 Ideal body weight   25-29.9 kg/m2 Overweight Increased incidence by 20%  30-34.9 kg/m2 Obese (Class I) Increased incidence by 68%  35-39.9 kg/m2 Severe obesity (Class II) Increased incidence by 136%  >40 kg/m2 Extreme obesity (Class III) Increased incidence by 254%   BMI Readings from Last 4 Encounters:  08/31/16 29.52 kg/m  07/20/16 29.52 kg/m  07/14/16 29.39 kg/m  07/05/16 29.87 kg/m   Wt Readings from Last 4 Encounters:  08/31/16 172 lb (78 kg)  07/20/16 172 lb (78 kg)  07/14/16 171 lb 3.2 oz (77.7 kg)  07/05/16 174 lb (78.9 kg)  Psych/Mental status: Alert, oriented x 3 (person, place, & time) Eyes: PERLA Respiratory: No evidence of acute respiratory distress  Cervical Spine Exam  Inspection: No masses, redness, or swelling Alignment: Symmetrical Functional ROM: Decreased ROM Stability: No instability detected Muscle strength & Tone: Functionally intact Sensory: Movement-associated pain Palpation: Complains of area being tender to palpation  Upper Extremity (UE) Exam    Side: Right upper extremity  Side: Left upper  extremity  Inspection: No masses, redness, swelling, or asymmetry  Inspection: No masses, redness, swelling, or asymmetry  Functional ROM: Unrestricted ROM         Functional ROM: Unrestricted ROM          Muscle strength & Tone: Functionally intact  Muscle strength & Tone: Functionally intact  Sensory: Unimpaired  Sensory: Dermatomal pain pattern  Palpation: Non-contributory  Palpation: Non-contributory   Thoracic Spine Exam  Inspection: No masses, redness, or swelling Alignment: Symmetrical Functional ROM: Unrestricted ROM Stability: No instability detected Sensory: Unimpaired Muscle strength & Tone: Functionally intact Palpation: Non-contributory  Lumbar Spine Exam  Inspection: No masses, redness, or swelling Alignment: Symmetrical Functional ROM: Decreased ROM Stability: No instability detected Muscle strength & Tone: Functionally intact Sensory: Movement-associated pain Palpation: Complains of area being tender to palpation Provocative Tests: Lumbar Hyperextension and rotation test: Positive bilaterally for facet joint pain. Patrick's Maneuver: evaluation deferred today              Gait & Posture Assessment  Ambulation: Unassisted Gait: Relatively normal for age and body habitus Posture: WNL   Lower Extremity Exam    Side: Right lower extremity  Side: Left lower extremity  Inspection: No masses, redness, swelling, or asymmetry  Inspection: No  masses, redness, swelling, or asymmetry  Functional ROM: Unrestricted ROM          Functional ROM: Unrestricted ROM          Muscle strength & Tone: Functionally intact  Muscle strength & Tone: Functionally intact  Sensory: Unimpaired  Sensory: Unimpaired  Palpation: Non-contributory  Palpation: Non-contributory   Assessment & Plan  Primary Diagnosis & Pertinent Problem List: The primary encounter diagnosis was Chronic pain. Diagnoses of Chronic neck pain (Location of Primary Source of Pain) (Bilateral) (L>R), Chronic upper back  pain (Location of Secondary source of pain) (Bilateral) (L>R), Chronic pain of both shoulders, Elevated sedimentation rate, Elevated C-reactive protein (CRP), and Chronic hip pain, unspecified laterality were also pertinent to this visit.  Visit Diagnosis: 1. Chronic pain   2. Chronic neck pain (Location of Primary Source of Pain) (Bilateral) (L>R)   3. Chronic upper back pain (Location of Secondary source of pain) (Bilateral) (L>R)   4. Chronic pain of both shoulders   5. Elevated sedimentation rate   6. Elevated C-reactive protein (CRP)   7. Chronic hip pain, unspecified laterality    Problems updated and reviewed during this visit: Problem  Chronic pain  Elevated Sedimentation Rate  Elevated C-Reactive Protein (Crp)  Anxiety and Depression   Problem-specific Plan(s): No problem-specific Assessment & Plan notes found for this encounter.  No new Assessment & Plan notes have been filed under this hospital service since the last note was generated. Service: Pain Management  Plan of Care   Problem List Items Addressed This Visit      High   Chronic hip pain (Bilateral) (L>R) (Chronic)   Relevant Medications   HYDROcodone-acetaminophen (NORCO/VICODIN) 5-325 MG tablet (Start on 09/14/2016)   Other Relevant Orders   DG HIP UNILAT W OR W/O PELVIS 2-3 VIEWS RIGHT   DG HIP UNILAT W OR W/O PELVIS 2-3 VIEWS LEFT   Chronic neck pain (Location of Primary Source of Pain) (Bilateral) (L>R) (Chronic)   Relevant Medications   HYDROcodone-acetaminophen (NORCO/VICODIN) 5-325 MG tablet (Start on 09/14/2016)   Other Relevant Orders   Cervical Epidural Injection   Chronic pain - Primary (Chronic)   Relevant Medications   HYDROcodone-acetaminophen (NORCO/VICODIN) 5-325 MG tablet (Start on 09/14/2016)   Chronic shoulder pain (Location of Tertiary source of pain) (Bilateral) (L>R) (Chronic)   Relevant Orders   Cervical Epidural Injection   Chronic upper back pain (Location of Secondary source of  pain) (Bilateral) (L>R) (Chronic)   Relevant Medications   HYDROcodone-acetaminophen (NORCO/VICODIN) 5-325 MG tablet (Start on 09/14/2016)   Other Relevant Orders   Cervical Epidural Injection     Medium   Elevated C-reactive protein (CRP)   Relevant Orders   Rheumatoid factor   ANA Comprehensive Panel   Elevated sedimentation rate   Relevant Orders   Rheumatoid factor   ANA Comprehensive Panel    Other Visit Diagnoses   None.    Pharmacotherapy (Medications Ordered): Meds ordered this encounter  Medications  . HYDROcodone-acetaminophen (NORCO/VICODIN) 5-325 MG tablet    Sig: Take 1 tablet by mouth every 6 (six) hours as needed for moderate pain.    Dispense:  120 tablet    Refill:  0    Do not add this medication to the electronic "Automatic Refill" notification system. Patient may have prescription filled one day early if pharmacy is closed on scheduled refill date. Do not fill until: 09/14/16 To last until: 10/14/16   New Prescriptions   No medications on file   Medications administered  during this visit: Ms. Brame had no medications administered during this visit. Lab-work, Procedure(s), & Referral(s) Ordered: Orders Placed This Encounter  Procedures  . Cervical Epidural Injection  . DG HIP UNILAT W OR W/O PELVIS 2-3 VIEWS RIGHT  . DG HIP UNILAT W OR W/O PELVIS 2-3 VIEWS LEFT  . Rheumatoid factor  . ANA Comprehensive Panel   Imaging & Referral(s) Ordered: None  Pharmacotherapy: Plan:  Today we will take over her medication management. In the near future, we will probably switch her to oxycodone so as to eliminate the acetaminophen.    Interventional Therapies: Pending/Scheduled/Planned:    Diagnostic left cervical epidural steroid injection #1 under fluoroscopic guidance and IV sedation.    Considering:   Diagnostic bilateral cervical facet block under fluoroscopic guidance and IV sedation.  Possible bilateral cervical facet radiofrequency ablation under  fluoroscopic guidance and IV sedation.  Diagnostic left-sided cervical epidural steroid injection under fluoroscopic guidance, with or without sedation.  Diagnostic bilateral intra-articular shoulder joint injection under fluoroscopic guidance, with or without sedation.  Diagnostic bilateral suprascapular nerve block under fluoroscopic guidance, with or without sedation.  Possible bilateral suprascapular nerve radiofrequency ablation under fluoroscopic guidance and IV sedation.  Diagnostic bilateral lumbar facet block under fluoroscopic guidance and IV sedation.  Possible bilateral lumbar facet radiofrequency ablation under fluoroscopic guidance and IV sedation.  Diagnostic bilateral intra-articular hip joint injection under fluoroscopic guidance, with or without sedation.  Diagnostic bilateral femoral and obturator tickler branch blocks under fluoroscopic guidance, with or without sedation.  Possible bilateral hip joint radiofrequency ablation  Diagnostic right intra-articular knee joint injection, without fluoroscopy or IV sedation.  Possible series of 5, right-sided, intra-articular knee joint injections with Hyalgan.  Possible right sided genicular nerve block under fluoroscopic guidance, with or without sedation.  Possible right sided genicular nerve radiofrequency ablation under fluoroscopic guidance and IV sedation.  Possible left-sided lumbar epidural steroid injection under fluoroscopic guidance, with or without sedation.    PRN Procedures:   None at this time.    Requested PM Follow-up: Return in about 1 month (around 10/01/2016) for Med-Mgmt, In addition, Schedule Procedure, (ASAA).  Future Appointments Date Time Provider Laie  09/05/2016 9:45 AM Milinda Pointer, MD [facility] None  09/07/2016 1:30 PM Shin-Yiing [place], PT [facility] None  09/14/2016 8:45 AM Leone Haven, MD [facility] None  11/01/2016 1:00 PM [place], PT [facility] None    Primary Care  Physician: Tommi Rumps, MD Location: [facility] Note by: Kathlen Brunswick. Dossie Arbour, M.D, DABA, DABAPM, DABPM, DABIPP, FIPP  Pain Score Disclaimer: We use the NRS-11 scale. This is a self-reported, subjective measurement of pain severity with only modest accuracy. It is used primarily to identify changes within a particular patient. It must be understood that outpatient pain scales are significantly less accurate that those used for research, where they can be applied under ideal controlled circumstances with minimal exposure to variables. In reality, the score is likely to be a combination of pain intensity and pain affect, where pain affect describes the degree of emotional arousal or changes in action readiness caused by the sensory experience of pain. Factors such as social and work situation, setting, emotional state, anxiety levels, expectation, and prior pain experience may influence pain perception and show large inter-individual differences that may also be affected by time variables.  Patient instructions provided during this appointment: Patient Instructions    Epidural Steroid Injection An epidural steroid injection is given to relieve pain in your neck, back, or legs  that is caused by the irritation or swelling of a nerve root. This procedure involves injecting a steroid and numbing medicine (anesthetic) into the epidural space. The epidural space is the space between the outer covering of your spinal cord and the bones that form your backbone (vertebra).  LET [facility] CARE PROVIDER KNOW ABOUT:   Any allergies you have.  All medicines you are taking, including vitamins, herbs, eye drops, creams, and over-the-counter medicines such as aspirin.  Previous problems you or members of your family have had with the use of anesthetics.  Any blood disorders or blood clotting disorders you have.  Previous surgeries you have had.  Medical conditions you  have.  RISKS AND COMPLICATIONS Generally, this is a safe procedure. However, as with any procedure, complications can occur. Possible complications of epidural steroid injection include:  Headache.  Bleeding.  Infection.  Allergic reaction to the medicines.  Damage to your nerves. The response to this procedure depends on the underlying cause of the pain and its duration. People who have long-term (chronic) pain are less likely to benefit from epidural steroids than are those people whose pain comes on strong and suddenly.  BEFORE THE PROCEDURE   Ask your health care provider about changing or stopping your regular medicines. You may be advised to stop taking blood-thinning medicines a few days before the procedure.  You may be given medicines to reduce anxiety.  Arrange for someone to take you home after the procedure.  PROCEDURE   You will remain awake during the procedure. You may receive medicine to make you relaxed.  You will be asked to lie on your stomach.  The injection site will be cleaned.  The injection site will be numbed with a medicine (local anesthetic).  A needle will be injected through your skin into the epidural space.  Your health care provider will use an X-ray machine to ensure that the steroid is delivered closest to the affected nerve. You may have minimal discomfort at this time.  Once the needle is in the right position, the local anesthetic and the steroid will be injected into the epidural space.  The needle will then be removed and a bandage will be applied to the injection site.  AFTER THE PROCEDURE   You may be monitored for a short time before you go home.  You may feel weakness or numbness in your arm or leg, which disappears within hours.  You may be allowed to eat, drink, and take your regular medicine.  You may have soreness at the site of the injection.   This information is not intended to replace advice given to you by your  health care provider. Make sure you discuss any questions you have with your health care provider.   Document Released: 01/30/2008 Document Revised: 06/25/2013 Document Reviewed: 04/11/2013 Elsevier Interactive Patient Education 2016 [place]  What are the risk, side effects and possible complications? Generally speaking, most procedures are safe.  However, with any procedure there are risks, side effects, and the possibility of complications.  The risks and complications are dependent upon the sites that are lesioned, or the type of nerve block to be performed.  The closer the procedure is to the spine, the more serious the risks are.  Great care is taken when placing the radio frequency needles, block needles or lesioning probes, but sometimes complications can occur. Infection: Any time there is an injection through the skin, there is a risk  of infection.  This is why sterile conditions are used for these blocks. There are four possible types of infection: 1. Localized skin infection. 2. Central Nervous System Infection: This can be in the form of Meningitis, which can be deadly. 3. Epidural Infections: This can be in the form of an epidural abscess, which can cause pressure inside of the spine, causing compression of the spinal cord with subsequent paralysis. This would require an emergency surgery to decompress, and there are no guarantees that the patient would recover from the paralysis. 4. Discitis: This is an infection of the intervertebral discs. It occurs in about 1% of discography procedures. It is difficult to treat and it may lead to surgery. Pain: the needles have to go through skin and soft tissues, will cause soreness. Damage to internal structures:  The nerves to be lesioned may be near blood vessels or other nerves which can be potentially damaged. Bleeding: Bleeding is more common if the patient is taking blood thinners such as  aspirin,  Coumadin, Ticiid, Plavix, etc., or if he/she have some genetic predisposition such as hemophilia. Bleeding into the spinal canal can cause compression of the spinal  cord with subsequent paralysis.  This would require an emergency surgery to decompress and there are no guarantees that the patient would recover from the paralysis. Pneumothorax: Puncturing of a lung is a possibility, every time a needle is introduced in the area of the chest or upper back.  Pneumothorax refers to free air around the collapsed lung(s), inside of the thoracic cavity (chest cavity).  Another two possible complications related to a similar event would include: Hemothorax and Chylothorax. These are variations of the Pneumothorax, where instead of air around the collapsed lung(s), you may have blood or chyle, respectively. Spinal headaches: They may occur with any procedures in the area of the spine. Persistent CSF (Cerebro-Spinal Fluid) leakage: This is a rare problem, but may occur with prolonged intrathecal or epidural catheters either due to the formation of a fistulous track or a dural tear. Nerve damage: By working so close to the spinal cord, there is always a possibility of nerve damage, which could be as serious as a permanent spinal cord injury with paralysis. Death: Although rare, severe deadly allergic reactions known as "Anaphylactic reaction" can occur to any of the medications used. Worsening of the symptoms: We can always make thing worse.  What are the chances of something like this happening? Chances of any of this occuring are extremely low.  By statistics, you have more of a chance of getting killed in a motor vehicle accident: while driving to the hospital than any of the above occurring .  Nevertheless, you should be aware that they are possibilities.  In general, it is similar to taking a shower.  Everybody knows that you can slip, hit your head and get killed.  Does that mean that you should not shower  again?  Nevertheless always keep in mind that statistics do not mean anything if you happen to be on the wrong side of them.  Even if a procedure has a 1 (one) in a 1,000,000 (million) chance of going wrong, it you happen to be that one..Also, keep in mind that by statistics, you have more of a chance of having something go wrong when taking medications.  Who should not have this procedure? If you are on a blood thinning medication (e.g. Coumadin, Plavix, see list of "Blood Thinners"), or if you have an active infection going on,  you should not have the procedure.  If you are taking any blood thinners, please inform your physician.  Preparing for your procedure: 1. Do not eat or drink anything at least eight (8) hours prior to the procedure. 2. Bring a driver with you .  It cannot be a taxi. 3. Come accompanied by an adult that can drive you back, and that is strong enough to help you if your legs get weak or numb from the local anesthetic. 4. Take all of your medicines the morning of the procedure with just enough water to swallow them. 5. If you have diabetes, make sure that you are scheduled to have your procedure done first thing in the morning, whenever possible. 6. If you have diabetes, take only half of your insulin dose and notify our nurse that you have done so as soon as you arrive at the clinic. 7. If you are diabetic, but only take blood sugar pills (oral hypoglycemic), then do not take them on the morning of your procedure.  You may take them after you have had the procedure. 8. Do not take aspirin or any aspirin-containing medications, at least eleven (11) days prior to the procedure.  They may prolong bleeding. 9. Wear loose fitting clothing that may be easy to take off and that you would not mind if it got stained with Betadine or blood. 10. Do not wear any jewelry or perfume 11. Remove any nail coloring.  It will interfere with some of our monitoring equipment. 12. If you take  Metformin for your diabetes, stop it 48 hours prior to the procedure.  NOTE: Remember that this is not meant to be interpreted as a complete list of all possible complications.  Unforeseen problems may occur.  BLOOD THINNERS The following drugs contain aspirin or other products, which can cause increased bleeding during surgery and should not be taken for 2 weeks prior to and 1 week after surgery.  If you should need take something for relief of minor pain, you may take acetaminophen which is found in Tylenol,m Datril, Anacin-3 and Panadol. It is not blood thinner. The products listed below are.  Do not take any of the products listed below in addition to any listed on your instruction sheet.  A.P.C or A.P.C with Codeine Codeine Phosphate Capsules #3 Ibuprofen Ridaura  ABC compound Congesprin Imuran rimadil  Advil Cope Indocin Robaxisal  Alka-Seltzer Effervescent Pain Reliever and Antacid Coricidin or Coricidin-D  Indomethacin Rufen  Alka-Seltzer plus Cold Medicine Cosprin Ketoprofen S-A-C Tablets  Anacin Analgesic Tablets or Capsules Coumadin Korlgesic Salflex  Anacin Extra Strength Analgesic tablets or capsules CP-2 Tablets Lanoril Salicylate  Anaprox Cuprimine Capsules Levenox Salocol  Anexsia-D Dalteparin Magan Salsalate  Anodynos Darvon compound Magnesium Salicylate Sine-off  Ansaid Dasin Capsules Magsal Sodium Salicylate  Anturane Depen Capsules Marnal Soma  APF Arthritis pain formula Dewitt's Pills Measurin Stanback  Argesic Dia-Gesic Meclofenamic Sulfinpyrazone  Arthritis Bayer Timed Release Aspirin Diclofenac Meclomen Sulindac  Arthritis pain formula Anacin Dicumarol Medipren Supac  Analgesic (Safety coated) Arthralgen Diffunasal Mefanamic Suprofen  Arthritis Strength Bufferin Dihydrocodeine Mepro Compound Suprol  Arthropan liquid Dopirydamole Methcarbomol with Aspirin Synalgos  ASA tablets/Enseals Disalcid Micrainin Tagament  Ascriptin Doan's Midol Talwin  Ascriptin A/D Dolene  Mobidin Tanderil  Ascriptin Extra Strength Dolobid Moblgesic Ticlid  Ascriptin with Codeine Doloprin or Doloprin with Codeine Momentum Tolectin  Asperbuf Duoprin Mono-gesic Trendar  Aspergum Duradyne Motrin or Motrin IB Triminicin  Aspirin plain, buffered or enteric coated Durasal Myochrisine Trigesic  Aspirin  Suppositories Easprin Nalfon Trillsate  Aspirin with Codeine Ecotrin Regular or Extra Strength Naprosyn Uracel  Atromid-S Efficin Naproxen Ursinus  Auranofin Capsules Elmiron Neocylate Vanquish  Axotal Emagrin Norgesic Verin  Azathioprine Empirin or Empirin with Codeine Normiflo Vitamin E  Azolid Emprazil Nuprin Voltaren  Bayer Aspirin plain, buffered or children's or timed BC Tablets or powders Encaprin Orgaran Warfarin Sodium  Buff-a-Comp Enoxaparin Orudis Zorpin  Buff-a-Comp with Codeine Equegesic Os-Cal-Gesic   Buffaprin Excedrin plain, buffered or Extra Strength Oxalid   Bufferin Arthritis Strength Feldene Oxphenbutazone   Bufferin plain or Extra Strength Feldene Capsules Oxycodone with Aspirin   Bufferin with Codeine Fenoprofen Fenoprofen Pabalate or Pabalate-SF   Buffets II Flogesic Panagesic   Buffinol plain or Extra Strength Florinal or Florinal with Codeine Panwarfarin   Buf-Tabs Flurbiprofen Penicillamine   Butalbital Compound Four-way cold tablets Penicillin   Butazolidin Fragmin Pepto-Bismol   Carbenicillin Geminisyn Percodan   Carna Arthritis Reliever Geopen Persantine   Carprofen Gold's salt Persistin   Chloramphenicol Goody's Phenylbutazone   Chloromycetin Haltrain Piroxlcam   Clmetidine heparin Plaquenil   Cllnoril Hyco-pap Ponstel   Clofibrate Hydroxy chloroquine Propoxyphen         Before stopping any of these medications, be sure to consult the physician who ordered them.  Some, such as Coumadin (Warfarin) are ordered to prevent or treat serious conditions such as "deep thrombosis", "pumonary embolisms", and other heart problems.  The amount of time that  you may need off of the medication may also vary with the medication and the reason for which you were taking it.  If you are taking any of these medications, please make sure you notify your pain physician before you undergo any procedures.  Please go to medical mall for lab work as soon as possible.

## 2016-08-31 NOTE — Patient Instructions (Addendum)
Epidural Steroid Injection An epidural steroid injection is given to relieve pain in your neck, back, or legs that is caused by the irritation or swelling of a nerve root. This procedure involves injecting a steroid and numbing medicine (anesthetic) into the epidural space. The epidural space is the space between the outer covering of your spinal cord and the bones that form your backbone (vertebra).  LET [facility] CARE PROVIDER KNOW ABOUT:   Any allergies you have.  All medicines you are taking, including vitamins, herbs, eye drops, creams, and over-the-counter medicines such as aspirin.  Previous problems you or members of your family have had with the use of anesthetics.  Any blood disorders or blood clotting disorders you have.  Previous surgeries you have had.  Medical conditions you have.  RISKS AND COMPLICATIONS Generally, this is a safe procedure. However, as with any procedure, complications can occur. Possible complications of epidural steroid injection include:  Headache.  Bleeding.  Infection.  Allergic reaction to the medicines.  Damage to your nerves. The response to this procedure depends on the underlying cause of the pain and its duration. People who have long-term (chronic) pain are less likely to benefit from epidural steroids than are those people whose pain comes on strong and suddenly.  BEFORE THE PROCEDURE   Ask your health care provider about changing or stopping your regular medicines. You may be advised to stop taking blood-thinning medicines a few days before the procedure.  You may be given medicines to reduce anxiety.  Arrange for someone to take you home after the procedure.  PROCEDURE   You will remain awake during the procedure. You may receive medicine to make you relaxed.  You will be asked to lie on your stomach.  The injection site will be cleaned.  The injection site will be numbed with a medicine (local anesthetic).  A needle  will be injected through your skin into the epidural space.  Your health care provider will use an X-ray machine to ensure that the steroid is delivered closest to the affected nerve. You may have minimal discomfort at this time.  Once the needle is in the right position, the local anesthetic and the steroid will be injected into the epidural space.  The needle will then be removed and a bandage will be applied to the injection site.  AFTER THE PROCEDURE   You may be monitored for a short time before you go home.  You may feel weakness or numbness in your arm or leg, which disappears within hours.  You may be allowed to eat, drink, and take your regular medicine.  You may have soreness at the site of the injection.   This information is not intended to replace advice given to you by your health care provider. Make sure you discuss any questions you have with your health care provider.   Document Released: 01/30/2008 Document Revised: 06/25/2013 Document Reviewed: 04/11/2013 Elsevier Interactive Patient Education 2016 [place]  What are the risk, side effects and possible complications? Generally speaking, most procedures are safe.  However, with any procedure there are risks, side effects, and the possibility of complications.  The risks and complications are dependent upon the sites that are lesioned, or the type of nerve block to be performed.  The closer the procedure is to the spine, the more serious the risks are.  Great care is taken when placing the radio frequency needles, block needles or lesioning probes, but sometimes  complications can occur. Infection: Any time there is an injection through the skin, there is a risk of infection.  This is why sterile conditions are used for these blocks. There are four possible types of infection: 1. Localized skin infection. 2. Central Nervous System Infection: This can be in the form of Meningitis,  which can be deadly. 3. Epidural Infections: This can be in the form of an epidural abscess, which can cause pressure inside of the spine, causing compression of the spinal cord with subsequent paralysis. This would require an emergency surgery to decompress, and there are no guarantees that the patient would recover from the paralysis. 4. Discitis: This is an infection of the intervertebral discs. It occurs in about 1% of discography procedures. It is difficult to treat and it may lead to surgery. Pain: the needles have to go through skin and soft tissues, will cause soreness. Damage to internal structures:  The nerves to be lesioned may be near blood vessels or other nerves which can be potentially damaged. Bleeding: Bleeding is more common if the patient is taking blood thinners such as  aspirin, Coumadin, Ticiid, Plavix, etc., or if he/she have some genetic predisposition such as hemophilia. Bleeding into the spinal canal can cause compression of the spinal  cord with subsequent paralysis.  This would require an emergency surgery to decompress and there are no guarantees that the patient would recover from the paralysis. Pneumothorax: Puncturing of a lung is a possibility, every time a needle is introduced in the area of the chest or upper back.  Pneumothorax refers to free air around the collapsed lung(s), inside of the thoracic cavity (chest cavity).  Another two possible complications related to a similar event would include: Hemothorax and Chylothorax. These are variations of the Pneumothorax, where instead of air around the collapsed lung(s), you may have blood or chyle, respectively. Spinal headaches: They may occur with any procedures in the area of the spine. Persistent CSF (Cerebro-Spinal Fluid) leakage: This is a rare problem, but may occur with prolonged intrathecal or epidural catheters either due to the formation of a fistulous track or a dural tear. Nerve damage: By working so close to the  spinal cord, there is always a possibility of nerve damage, which could be as serious as a permanent spinal cord injury with paralysis. Death: Although rare, severe deadly allergic reactions known as "Anaphylactic reaction" can occur to any of the medications used. Worsening of the symptoms: We can always make thing worse.  What are the chances of something like this happening? Chances of any of this occuring are extremely low.  By statistics, you have more of a chance of getting killed in a motor vehicle accident: while driving to the hospital than any of the above occurring .  Nevertheless, you should be aware that they are possibilities.  In general, it is similar to taking a shower.  Everybody knows that you can slip, hit your head and get killed.  Does that mean that you should not shower again?  Nevertheless always keep in mind that statistics do not mean anything if you happen to be on the wrong side of them.  Even if a procedure has a 1 (one) in a 1,000,000 (million) chance of going wrong, it you happen to be that one..Also, keep in mind that by statistics, you have more of a chance of having something go wrong when taking medications.  Who should not have this procedure? If you are on a blood thinning medication (  e.g. Coumadin, Plavix, see list of "Blood Thinners"), or if you have an active infection going on, you should not have the procedure.  If you are taking any blood thinners, please inform your physician.  Preparing for your procedure: 1. Do not eat or drink anything at least eight (8) hours prior to the procedure. 2. Bring a driver with you .  It cannot be a taxi. 3. Come accompanied by an adult that can drive you back, and that is strong enough to help you if your legs get weak or numb from the local anesthetic. 4. Take all of your medicines the morning of the procedure with just enough water to swallow them. 5. If you have diabetes, make sure that you are scheduled to have your  procedure done first thing in the morning, whenever possible. 6. If you have diabetes, take only half of your insulin dose and notify our nurse that you have done so as soon as you arrive at the clinic. 7. If you are diabetic, but only take blood sugar pills (oral hypoglycemic), then do not take them on the morning of your procedure.  You may take them after you have had the procedure. 8. Do not take aspirin or any aspirin-containing medications, at least eleven (11) days prior to the procedure.  They may prolong bleeding. 9. Wear loose fitting clothing that may be easy to take off and that you would not mind if it got stained with Betadine or blood. 10. Do not wear any jewelry or perfume 11. Remove any nail coloring.  It will interfere with some of our monitoring equipment. 12. If you take Metformin for your diabetes, stop it 48 hours prior to the procedure.  NOTE: Remember that this is not meant to be interpreted as a complete list of all possible complications.  Unforeseen problems may occur.  BLOOD THINNERS The following drugs contain aspirin or other products, which can cause increased bleeding during surgery and should not be taken for 2 weeks prior to and 1 week after surgery.  If you should need take something for relief of minor pain, you may take acetaminophen which is found in Tylenol,m Datril, Anacin-3 and Panadol. It is not blood thinner. The products listed below are.  Do not take any of the products listed below in addition to any listed on your instruction sheet.  A.P.C or A.P.C with Codeine Codeine Phosphate Capsules #3 Ibuprofen Ridaura  ABC compound Congesprin Imuran rimadil  Advil Cope Indocin Robaxisal  Alka-Seltzer Effervescent Pain Reliever and Antacid Coricidin or Coricidin-D  Indomethacin Rufen  Alka-Seltzer plus Cold Medicine Cosprin Ketoprofen S-A-C Tablets  Anacin Analgesic Tablets or Capsules Coumadin Korlgesic Salflex  Anacin Extra Strength Analgesic tablets or  capsules CP-2 Tablets Lanoril Salicylate  Anaprox Cuprimine Capsules Levenox Salocol  Anexsia-D Dalteparin Magan Salsalate  Anodynos Darvon compound Magnesium Salicylate Sine-off  Ansaid Dasin Capsules Magsal Sodium Salicylate  Anturane Depen Capsules Marnal Soma  APF Arthritis pain formula Dewitt's Pills Measurin Stanback  Argesic Dia-Gesic Meclofenamic Sulfinpyrazone  Arthritis Bayer Timed Release Aspirin Diclofenac Meclomen Sulindac  Arthritis pain formula Anacin Dicumarol Medipren Supac  Analgesic (Safety coated) Arthralgen Diffunasal Mefanamic Suprofen  Arthritis Strength Bufferin Dihydrocodeine Mepro Compound Suprol  Arthropan liquid Dopirydamole Methcarbomol with Aspirin Synalgos  ASA tablets/Enseals Disalcid Micrainin Tagament  Ascriptin Doan's Midol Talwin  Ascriptin A/D Dolene Mobidin Tanderil  Ascriptin Extra Strength Dolobid Moblgesic Ticlid  Ascriptin with Codeine Doloprin or Doloprin with Codeine Momentum Tolectin  Asperbuf Duoprin Mono-gesic Trendar  Aspergum Duradyne  Motrin or Motrin IB Triminicin  Aspirin plain, buffered or enteric coated Durasal Myochrisine Trigesic  Aspirin Suppositories Easprin Nalfon Trillsate  Aspirin with Codeine Ecotrin Regular or Extra Strength Naprosyn Uracel  Atromid-S Efficin Naproxen Ursinus  Auranofin Capsules Elmiron Neocylate Vanquish  Axotal Emagrin Norgesic Verin  Azathioprine Empirin or Empirin with Codeine Normiflo Vitamin E  Azolid Emprazil Nuprin Voltaren  Bayer Aspirin plain, buffered or children's or timed BC Tablets or powders Encaprin Orgaran Warfarin Sodium  Buff-a-Comp Enoxaparin Orudis Zorpin  Buff-a-Comp with Codeine Equegesic Os-Cal-Gesic   Buffaprin Excedrin plain, buffered or Extra Strength Oxalid   Bufferin Arthritis Strength Feldene Oxphenbutazone   Bufferin plain or Extra Strength Feldene Capsules Oxycodone with Aspirin   Bufferin with Codeine Fenoprofen Fenoprofen Pabalate or Pabalate-SF   Buffets II Flogesic  Panagesic   Buffinol plain or Extra Strength Florinal or Florinal with Codeine Panwarfarin   Buf-Tabs Flurbiprofen Penicillamine   Butalbital Compound Four-way cold tablets Penicillin   Butazolidin Fragmin Pepto-Bismol   Carbenicillin Geminisyn Percodan   Carna Arthritis Reliever Geopen Persantine   Carprofen Gold's salt Persistin   Chloramphenicol Goody's Phenylbutazone   Chloromycetin Haltrain Piroxlcam   Clmetidine heparin Plaquenil   Cllnoril Hyco-pap Ponstel   Clofibrate Hydroxy chloroquine Propoxyphen         Before stopping any of these medications, be sure to consult the physician who ordered them.  Some, such as Coumadin (Warfarin) are ordered to prevent or treat serious conditions such as "deep thrombosis", "pumonary embolisms", and other heart problems.  The amount of time that you may need off of the medication may also vary with the medication and the reason for which you were taking it.  If you are taking any of these medications, please make sure you notify your pain physician before you undergo any procedures.  Please go to medical mall for lab work as soon as possible.

## 2016-09-01 LAB — ANA COMPREHENSIVE PANEL
Chromatin Ab SerPl-aCnc: <0.2 AI (ref 0.0–0.9)
DS DNA AB: 2 [IU]/mL (ref 0–9)
ENA SM Ab Ser-aCnc: <0.2 AI (ref 0.0–0.9)
SSA (Ro) (ENA) Antibody, IgG: <0.2 AI (ref 0.0–0.9)
SSB (La) (ENA) Antibody, IgG: <0.2 AI (ref 0.0–0.9)
Scleroderma (Scl-70) (ENA) Antibody, IgG: <0.2 AI (ref 0.0–0.9)

## 2016-09-01 LAB — RHEUMATOID FACTOR: Rhuematoid fact SerPl-aCnc: <10 IU/mL (ref 0.0–13.9)

## 2016-09-05 ENCOUNTER — Ambulatory Visit (HOSPITAL_BASED_OUTPATIENT_CLINIC_OR_DEPARTMENT_OTHER): Payer: BLUE CROSS/BLUE SHIELD | Admitting: Pain Medicine

## 2016-09-05 ENCOUNTER — Ambulatory Visit
Admission: RE | Admit: 2016-09-05 | Discharge: 2016-09-05 | Disposition: A | Discharge status: Home / Self Care | Payer: BLUE CROSS/BLUE SHIELD | Source: Ambulatory Visit | Attending: Pain Medicine | Admitting: Pain Medicine

## 2016-09-05 ENCOUNTER — Telehealth: Payer: Self-pay | Admitting: *Deleted

## 2016-09-05 ENCOUNTER — Encounter: Payer: Self-pay | Admitting: Pain Medicine

## 2016-09-05 VITALS — BP 113/58 | HR 71 | Temp 96.9°F | Resp 14 | Ht 64.0 in | Wt 174.0 lb

## 2016-09-05 DIAGNOSIS — M25551 Pain in right hip: Secondary | ICD-10-CM | POA: Insufficient documentation

## 2016-09-05 DIAGNOSIS — M549 Dorsalgia, unspecified: Secondary | ICD-10-CM

## 2016-09-05 DIAGNOSIS — Z888 Allergy status to other drugs, medicaments and biological substances status: Secondary | ICD-10-CM | POA: Diagnosis not present

## 2016-09-05 DIAGNOSIS — M546 Pain in thoracic spine: Secondary | ICD-10-CM | POA: Insufficient documentation

## 2016-09-05 DIAGNOSIS — G8929 Other chronic pain: Secondary | ICD-10-CM | POA: Diagnosis not present

## 2016-09-05 DIAGNOSIS — M542 Cervicalgia: Secondary | ICD-10-CM | POA: Diagnosis not present

## 2016-09-05 DIAGNOSIS — Z9049 Acquired absence of other specified parts of digestive tract: Secondary | ICD-10-CM | POA: Diagnosis not present

## 2016-09-05 DIAGNOSIS — M25552 Pain in left hip: Secondary | ICD-10-CM | POA: Insufficient documentation

## 2016-09-05 DIAGNOSIS — M25511 Pain in right shoulder: Secondary | ICD-10-CM | POA: Diagnosis not present

## 2016-09-05 DIAGNOSIS — M25512 Pain in left shoulder: Secondary | ICD-10-CM

## 2016-09-05 DIAGNOSIS — Z9889 Other specified postprocedural states: Secondary | ICD-10-CM | POA: Insufficient documentation

## 2016-09-05 MED ORDER — MIDAZOLAM HCL 5 MG/5ML IJ SOLN
1.0000 mg | INTRAMUSCULAR | Status: DC | PRN
  Administered 2016-09-05: 3 mg via INTRAVENOUS
  Filled 2016-09-05: qty 5

## 2016-09-05 MED ORDER — LACTATED RINGERS IV SOLN
1000.0000 mL | Freq: Once | INTRAVENOUS | Status: AC
  Administered 2016-09-05: 1000 mL via INTRAVENOUS

## 2016-09-05 MED ORDER — LIDOCAINE HCL (PF) 1 % IJ SOLN
10.0000 mL | Freq: Once | INTRAMUSCULAR | Status: AC
  Administered 2016-09-05: 10 mL
  Filled 2016-09-05: qty 10

## 2016-09-05 MED ORDER — SODIUM CHLORIDE 0.9% FLUSH
2.0000 mL | Freq: Once | INTRAVENOUS | Status: AC
Start: 2016-09-05 — End: 2016-09-05
  Administered 2016-09-05: 2 mL

## 2016-09-05 MED ORDER — FENTANYL CITRATE (PF) 100 MCG/2ML IJ SOLN
25.0000 ug | INTRAMUSCULAR | Status: DC | PRN
  Administered 2016-09-05: 100 ug via INTRAVENOUS
  Filled 2016-09-05: qty 2

## 2016-09-05 MED ORDER — SODIUM CHLORIDE 0.9 % IJ SOLN
INTRAMUSCULAR | Status: AC
  Filled 2016-09-05: qty 10

## 2016-09-05 MED ORDER — DEXAMETHASONE SODIUM PHOSPHATE 10 MG/ML IJ SOLN
10.0000 mg | Freq: Once | INTRAMUSCULAR | Status: AC
Start: 2016-09-05 — End: 2016-09-05
  Administered 2016-09-05: 10 mg
  Filled 2016-09-05: qty 1

## 2016-09-05 MED ORDER — ROPIVACAINE HCL 2 MG/ML IJ SOLN
2.0000 mL | Freq: Once | INTRAMUSCULAR | Status: AC
  Administered 2016-09-05: 2 mL via EPIDURAL
  Filled 2016-09-05: qty 10

## 2016-09-05 MED ORDER — IOPAMIDOL (ISOVUE-M 200) INJECTION 41%
10.0000 mL | Freq: Once | INTRAMUSCULAR | Status: AC
  Administered 2016-09-05: 10 mL via EPIDURAL
  Filled 2016-09-05: qty 10

## 2016-09-05 NOTE — Patient Instructions (Signed)
Pain Management Discharge Instructions  General Discharge Instructions :  If you need to reach your doctor call: Monday-Friday 8:00 am - 4:00 pm at [phone] or toll free [phone].  After clinic hours [phone] to have operator reach doctor.  Bring all of your medication bottles to all your appointments in the pain clinic.  To cancel or reschedule your appointment with Pain Management please remember to call 24 hours in advance to avoid a fee.  Refer to the educational materials which you have been given on: General Risks, I had my Procedure. Discharge Instructions, Post Sedation.  Post Procedure Instructions:  The drugs you were given will stay in your system until tomorrow, so for the next 24 hours you should not drive, make any legal decisions or drink any alcoholic beverages.  You may eat anything you prefer, but it is better to start with liquids then soups and crackers, and gradually work up to solid foods.  Please notify your doctor immediately if you have any unusual bleeding, trouble breathing or pain that is not related to your normal pain.  Depending on the type of procedure that was done, some parts of your body may feel week and/or numb.  This usually clears up by tonight or the next day.  Walk with the use of an assistive device or accompanied by an adult for the 24 hours.  You may use ice on the affected area for the first 24 hours.  Put ice in a Ziploc bag and cover with a towel and place against area 15 minutes on 15 minutes off.  You may switch to heat after 24 hours.Epidural Steroid Injection An epidural steroid injection is given to relieve pain in your neck, back, or legs that is caused by the irritation or swelling of a nerve root. This procedure involves injecting a steroid and numbing medicine (anesthetic) into the epidural space. The epidural space is the space between the outer covering of your spinal cord and the bones that form your backbone  (vertebra).  LET YOUR HEALTH CARE PROVIDER KNOW ABOUT:   Any allergies you have.  All medicines you are taking, including vitamins, herbs, eye drops, creams, and over-the-counter medicines such as aspirin.  Previous problems you or members of your family have had with the use of anesthetics.  Any blood disorders or blood clotting disorders you have.  Previous surgeries you have had.  Medical conditions you have. RISKS AND COMPLICATIONS Generally, this is a safe procedure. However, as with any procedure, complications can occur. Possible complications of epidural steroid injection include:  Headache.  Bleeding.  Infection.  Allergic reaction to the medicines.  Damage to your nerves. The response to this procedure depends on the underlying cause of the pain and its duration. People who have long-term (chronic) pain are less likely to benefit from epidural steroids than are those people whose pain comes on strong and suddenly. BEFORE THE PROCEDURE   Ask your health care provider about changing or stopping your regular medicines. You may be advised to stop taking blood-thinning medicines a few days before the procedure.  You may be given medicines to reduce anxiety.  Arrange for someone to take you home after the procedure. PROCEDURE   You will remain awake during the procedure. You may receive medicine to make you relaxed.  You will be asked to lie on your stomach.  The injection site will be cleaned.  The injection site will be numbed with a medicine (local anesthetic).  A needle will be   injected through your skin into the epidural space.  Your health care provider will use an X-ray machine to ensure that the steroid is delivered closest to the affected nerve. You may have minimal discomfort at this time.  Once the needle is in the right position, the local anesthetic and the steroid will be injected into the epidural space.  The needle will then be removed and a  bandage will be applied to the injection site. AFTER THE PROCEDURE   You may be monitored for a short time before you go home.  You may feel weakness or numbness in your arm or leg, which disappears within hours.  You may be allowed to eat, drink, and take your regular medicine.  You may have soreness at the site of the injection.   This information is not intended to replace advice given to you by your health care provider. Make sure you discuss any questions you have with your health care provider.   Document Released: 01/30/2008 Document Revised: 06/25/2013 Document Reviewed: 04/11/2013 Elsevier Interactive Patient Education 2016 [employer]  

## 2016-09-05 NOTE — Telephone Encounter (Signed)
Spoke with patient, she had a question re; pain diary that she received post procedure to note amount of improvement post procedure.  Asked if it was specific to the area worked on or if it included other painful areas.  Asked patient to only comment on the area that was worked on/treated today.

## 2016-09-05 NOTE — Progress Notes (Signed)
Patient's Name: Nicole Walter  MRN: [id number]  Referring Provider: Milinda Pointer, MD  DOB: February 08, 1961  PCP: Tommi Rumps, MD  DOS: 09/05/2016  Note by: Kathlen Brunswick. Dossie Arbour, MD  Service setting: Ambulatory outpatient  Location: [facility] (AMB) Pain Management Facility  Visit type: Procedure  Specialty: Interventional Pain Management  Patient type: Established   Primary Reason for Visit: Interventional Pain Management Treatment. CC: Neck Pain; Shoulder Pain (bilateral); and Hip Pain (bilateral)  Procedure:  Anesthesia, Analgesia, Anxiolysis:  Type: Diagnostic, Inter-Laminar, Epidural Steroid Injection Region: Posterior Cervico-thoracic Region Level: C7-T1 Laterality: Right-Sided Paramedial  Type: Local Anesthesia with Moderate (Conscious) Sedation Local Anesthetic: Lidocaine 1% Route: Intravenous (IV) IV Access: Secured Sedation: Meaningful verbal contact was maintained at all times during the procedure  Indication(s): Analgesia and Anxiety  Indications: 1. Chronic neck pain (Location of Primary Source of Pain) (Bilateral) (L>R)   2. Chronic upper back pain (Location of Secondary source of pain) (Bilateral) (L>R)   3. Chronic pain of both shoulders    Pain Score: Pre-procedure: 5 /10 Post-procedure: 0-No pain/10  Pre-Procedure Assessment:  Nicole Walter is a 55 y.o. (year old), female patient, seen today for interventional treatment. She  has a past surgical history that includes Cholecystectomy; Tubal ligation; Ankle surgery; Nasal sinus surgery; Tonsillectomy; Colonoscopy; Esophagogastroduodenoscopy (egd) with propofol (N/A, 02/22/2016); and Colonoscopy with propofol (N/A, 02/22/2016).. Her primarily concern today is the Neck Pain; Shoulder Pain (bilateral); and Hip Pain (bilateral) The primary encounter diagnosis was Chronic neck pain (Location of Primary Source of Pain) (Bilateral) (L>R). Diagnoses of Chronic upper back pain (Location of Secondary source of pain) (Bilateral) (L>R) and  Chronic pain of both shoulders were also pertinent to this visit.     Date of Last Visit: 08/31/16 Service Provided on Last Visit: Evaluation, Med Refill  Coagulation Parameters Lab Results  Component Value Date   PLT 256 07/05/2016   Verification of the correct person, correct site (including marking of site), and correct procedure were performed and confirmed by the patient.  Consent: Before the procedure and under the influence of no sedative(s), amnesic(s), or anxiolytics, the patient was informed of the treatment options, risks and possible complications. To fulfill our ethical and legal obligations, as recommended by the American Medical Association's Code of Ethics, I have informed the patient of my clinical impression; the nature and purpose of the treatment or procedure; the risks, benefits, and possible complications of the intervention; the alternatives, including doing nothing; the risk(s) and benefit(s) of the alternative treatment(s) or procedure(s); and the risk(s) and benefit(s) of doing nothing. The patient was provided information about the general risks and possible complications associated with the procedure. These may include, but are not limited to: failure to achieve desired goals, infection, bleeding, organ or nerve damage, allergic reactions, paralysis, and death. In addition, the patient was informed of those risks and complications associated to Spine-related procedures, such as failure to decrease pain; infection (i.e.: Meningitis, epidural or intraspinal abscess); bleeding (i.e.: epidural hematoma, subarachnoid hemorrhage, or any other type of intraspinal or peri-dural bleeding); organ or nerve damage (i.e.: Any type of peripheral nerve, nerve root, or spinal cord injury) with subsequent damage to sensory, motor, and/or autonomic systems, resulting in permanent pain, numbness, and/or weakness of one or several areas of the body; allergic reactions; (i.e.: anaphylactic  reaction); and/or death. Furthermore, the patient was informed of those risks and complications associated with the medications. These include, but are not limited to: allergic reactions (i.e.: anaphylactic or anaphylactoid reaction(s)); adrenal axis suppression; blood  sugar elevation that in diabetics may result in ketoacidosis or comma; water retention that in patients with history of congestive heart failure may result in shortness of breath, pulmonary edema, and decompensation with resultant heart failure; weight gain; swelling or edema; medication-induced neural toxicity; particulate matter embolism and blood vessel occlusion with resultant organ, and/or nervous system infarction; and/or aseptic necrosis of one or more joints. Finally, the patient was informed that Medicine is not an exact science; therefore, there is also the possibility of unforeseen or unpredictable risks and/or possible complications that may result in a catastrophic outcome. The patient indicated having understood very clearly. We have given the patient no guarantees and we have made no promises. Enough time was given to the patient to ask questions, all of which were answered to the patient's satisfaction. Nicole Walter has indicated that she wanted to continue with the procedure.  Consent Attestation: I, the ordering provider, attest that I have discussed with the patient the benefits, risks, side-effects, alternatives, likelihood of achieving goals, and potential problems during recovery for the procedure that I have provided informed consent.  Pre-Procedure Preparation:  Safety Precautions: Allergies reviewed. The patient was asked about blood thinners, or active infections, both of which were denied. The patient was asked to confirm the procedure and laterality, before marking the site, and again before commencing the procedure. Appropriate site, procedure, and patient were confirmed by following the Joint Commission's Universal  Protocol (UP.01.01.01), in the form of a "Time Out". The patient was asked to participate by confirming the accuracy of the "Time Out" information. Patient was assessed for positional comfort and pressure points before starting the procedure. Allergies: She is allergic to food and other. Allergy Precautions: None required Infection Control Precautions: Sterile technique used. Standard Universal Precautions were taken as recommended by the Department of [facility] for Disease Control and Prevention (CDC). Standard pre-surgical skin prep was conducted. Respiratory hygiene and cough etiquette was practiced. Hand hygiene observed. Safe injection practices and needle disposal techniques followed. SDV (single dose vial) medications used. Medications properly checked for expiration dates and contaminants. Personal protective equipment (PPE) used as per protocol. Monitoring:  As per clinic protocol. Vitals:   09/05/16 1107 09/05/16 1117 09/05/16 1127 09/05/16 1137  BP: 99/75 (!) 108/59 110/63 (!) 113/58  Pulse: 75 65 72 71  Resp: 12 14 14 14   Temp:  (!) 96.3 F (35.7 C)  (!) 96.9 F (36.1 C)  SpO2: 93% 92% 96% 98%  Weight:      Height:      Calculated BMI: Body mass index is 29.87 kg/m. Time-out: "Time-out" completed before starting procedure, as per protocol.  Description of Procedure Process:   Time-out: "Time-out" completed before starting procedure, as per protocol. Position: Prone with head of the table was raised to facilitate breathing. Target Area: For Epidural Steroid injections the target is the interlaminar space, initially targeting the lower border of the superior vertebral body lamina. Approach: Paramedial approach. Area Prepped: Entire PosteriorCervical Region Prepping solution: ChloraPrep (2% chlorhexidine gluconate and 70% isopropyl alcohol) Safety Precautions: Aspiration looking for blood return was conducted prior to all injections. At no point did we inject any  substances, as a needle was being advanced. No attempts were made at seeking any paresthesias. Safe injection practices and needle disposal techniques used. Medications properly checked for expiration dates. SDV (single dose vial) medications used. Description of the Procedure: Protocol guidelines were followed. The procedure needle was introduced through the skin, ipsilateral to the reported pain, and  advanced to the target area. Bone was contacted and the needle walked caudad, until the lamina was cleared. The epidural space was identified using "loss-of-resistance technique" with 2-3 ml of PF-NaCl (0.9% NSS), in a 5cc LOR glass syringe. EBL: None Materials & Medications:  Needle(s) Used: 20g - 10cm, Tuohy-style epidural needle Medication(s): see below.  Imaging Guidance (Spinal):  Type of Imaging Technique: Fluoroscopy Guidance (Spinal) Indication(s): Assistance in needle guidance and placement for procedures requiring needle placement in or near specific anatomical locations not easily accessible without such assistance. Exposure Time: Please see nurses notes. Contrast: Before injecting any contrast, we confirmed that the patient did not have an allergy to iodine, shellfish, or radiological contrast. Once satisfactory needle placement was completed at the desired level, radiological contrast was injected. Contrast injected under live fluoroscopy. No contrast complications. See chart for type and volume of contrast used. Fluoroscopic Guidance: I was personally present during the use of fluoroscopy. "Tunnel Vision Technique" used to obtain the best possible view of the target area. Parallax error corrected before commencing the procedure. "Direction-depth-direction" technique used to introduce the needle under continuous pulsed fluoroscopy. Once target was reached, antero-posterior, oblique, and lateral fluoroscopic projection used confirm needle placement in all planes. Images permanently stored in  EMR. Interpretation: I personally interpreted the imaging intraoperatively. Adequate needle placement confirmed in multiple planes. Appropriate spread of contrast into desired area was observed. No evidence of afferent or efferent intravascular uptake. No intrathecal or subarachnoid spread observed. Permanent images saved into the patient's record.  Antibiotic Prophylaxis:  Indication(s): No indications identified. Type:  Antibiotics Given (last 72 hours)    None      Post-operative Assessment:  Complications: No immediate post-treatment complications observed by team, or reported by patient. Disposition: The patient tolerated the entire procedure well. A repeat set of vitals were taken after the procedure and the patient was kept under observation following institutional policy, for this type of procedure. Post-procedural neurological assessment was performed, showing return to baseline, prior to discharge. The patient was provided with post-procedure discharge instructions, including a section on how to identify potential problems. Should any problems arise concerning this procedure, the patient was given instructions to immediately contact us, at any time, without hesitation. In any case, we plan to contact the patient by telephone for a follow-up status report regarding this interventional procedure. Comments:  No additional relevant information.  Plan of Care  Discharge to: Discharge home  Medications ordered for procedure: Meds ordered this encounter  Medications  . fentaNYL (SUBLIMAZE) injection 25-50 mcg    Make sure Narcan is available in the pyxis when using this medication. In the event of respiratory depression (RR< 8/min): Titrate NARCAN (naloxone) in increments of 0.1 to 0.2 mg IV at 2-3 minute intervals, until desired degree of reversal.  . lactated ringers infusion 1,000 mL  . midazolam (VERSED) 5 MG/5ML injection 1-2 mg    Make sure Flumazenil is available in the pyxis when  using this medication. If oversedation occurs, administer 0.2 mg IV over 15 sec. If after 45 sec no response, administer 0.2 mg again over 1 min; may repeat at 1 min intervals; not to exceed 4 doses (1 mg)  . dexamethasone (DECADRON) injection 10 mg  . iopamidol (ISOVUE-M) 41 % intrathecal injection 10 mL  . lidocaine (PF) (XYLOCAINE) 1 % injection 10 mL  . sodium chloride flush (NS) 0.9 % injection 2 mL  . ropivacaine (PF) 2 mg/ml (0.2%) (NAROPIN) epidural 2 mL  . sodium chloride 0.9 %  injection    TICE, KORI: cabinet override   Medications administered: (For more details, see medical record) We administered fentaNYL, lactated ringers, midazolam, dexamethasone, iopamidol, lidocaine (PF), sodium chloride flush, and ropivacaine (PF) 2 mg/ml (0.2%).  Imaging Ordered: No results found for this or any previous visit. New Prescriptions   No medications on file   Physician-requested Follow-up:  Return in about 2 weeks (around 09/19/2016) for Post-Procedure evaluation.  Future Appointments Date Time Provider [place]  09/07/2016 1:30 PM Shin-Yiing [place], PT [facility] None  09/14/2016 8:45 AM Leone Haven, MD [facility] None  10/11/2016 1:15 PM Milinda Pointer, MD [facility] None  11/01/2016 1:00 PM S[place], PT [facility] None   Primary Care Physician: Tommi Rumps, MD Location: [facility] Note by: Kathlen Brunswick. Dossie Arbour, M.D, DABA, DABAPM, DABPM, DABIPP, FIPP  Disclaimer:  Medicine is not an exact science. The only guarantee in medicine is that nothing is guaranteed. It is important to note that the decision to proceed with this intervention was based on the information collected from the patient. The Data and conclusions were drawn from the patient's questionnaire, the interview, and the physical examination. Because the information was provided in large part by the patient, it cannot be guaranteed that it has not been purposely or  unconsciously manipulated. Every effort has been made to obtain as much relevant data as possible for this evaluation. It is important to note that the conclusions that lead to this procedure are derived in large part from the available data. Always take into account that the treatment will also be dependent on availability of resources and existing treatment guidelines, considered by other Pain Management Practitioners as being common knowledge and practice, at the time of the intervention. For Medico-Legal purposes, it is also important to point out that variation in procedural techniques and pharmacological choices are the acceptable norm. The indications, contraindications, technique, and results of the above procedure should only be interpreted and judged by a Board-Certified Interventional Pain Specialist with extensive familiarity and expertise in the same exact procedure and technique. Attempts at providing opinions without similar or greater experience and expertise than that of the treating physician will be considered as inappropriate and unethical, and shall result in a formal complaint to the state medical board and applicable specialty societies.  Instructions provided at this appointment: Patient Instructions  Pain Management Discharge Instructions  General Discharge Instructions :  If you need to reach your doctor call: Monday-Friday 8:00 am - 4:00 pm at [phone] or toll free [phone].  After clinic hours [phone] to have operator reach doctor.  Bring all of your medication bottles to all your appointments in the pain clinic.  To cancel or reschedule your appointment with Pain Management please remember to call 24 hours in advance to avoid a fee.  Refer to the educational materials which you have been given on: General Risks, I had my Procedure. Discharge Instructions, Post Sedation.  Post Procedure Instructions:  The drugs you were given will stay in your system until  tomorrow, so for the next 24 hours you should not drive, make any legal decisions or drink any alcoholic beverages.  You may eat anything you prefer, but it is better to start with liquids then soups and crackers, and gradually work up to solid foods.  Please notify your doctor immediately if you have any unusual bleeding, trouble breathing or pain that is not related to your normal pain.  Depending on the type of procedure that was done,  some parts of your body may feel week and/or numb.  This usually clears up by tonight or the next day.  Walk with the use of an assistive device or accompanied by an adult for the 24 hours.  You may use ice on the affected area for the first 24 hours.  Put ice in a Ziploc bag and cover with a towel and place against area 15 minutes on 15 minutes off.  You may switch to heat after 24 hours.Epidural Steroid Injection An epidural steroid injection is given to relieve pain in your neck, back, or legs that is caused by the irritation or swelling of a nerve root. This procedure involves injecting a steroid and numbing medicine (anesthetic) into the epidural space. The epidural space is the space between the outer covering of your spinal cord and the bones that form your backbone (vertebra).  LET [facility] CARE PROVIDER KNOW ABOUT:   Any allergies you have.  All medicines you are taking, including vitamins, herbs, eye drops, creams, and over-the-counter medicines such as aspirin.  Previous problems you or members of your family have had with the use of anesthetics.  Any blood disorders or blood clotting disorders you have.  Previous surgeries you have had.  Medical conditions you have. RISKS AND COMPLICATIONS Generally, this is a safe procedure. However, as with any procedure, complications can occur. Possible complications of epidural steroid injection include:  Headache.  Bleeding.  Infection.  Allergic reaction to the medicines.  Damage to your  nerves. The response to this procedure depends on the underlying cause of the pain and its duration. People who have long-term (chronic) pain are less likely to benefit from epidural steroids than are those people whose pain comes on strong and suddenly. BEFORE THE PROCEDURE   Ask your health care provider about changing or stopping your regular medicines. You may be advised to stop taking blood-thinning medicines a few days before the procedure.  You may be given medicines to reduce anxiety.  Arrange for someone to take you home after the procedure. PROCEDURE   You will remain awake during the procedure. You may receive medicine to make you relaxed.  You will be asked to lie on your stomach.  The injection site will be cleaned.  The injection site will be numbed with a medicine (local anesthetic).  A needle will be injected through your skin into the epidural space.  Your health care provider will use an X-ray machine to ensure that the steroid is delivered closest to the affected nerve. You may have minimal discomfort at this time.  Once the needle is in the right position, the local anesthetic and the steroid will be injected into the epidural space.  The needle will then be removed and a bandage will be applied to the injection site. AFTER THE PROCEDURE   You may be monitored for a short time before you go home.  You may feel weakness or numbness in your arm or leg, which disappears within hours.  You may be allowed to eat, drink, and take your regular medicine.  You may have soreness at the site of the injection.   This information is not intended to replace advice given to you by your health care provider. Make sure you discuss any questions you have with your health care provider.   Document Released: 01/30/2008 Document Revised: 06/25/2013 Document Reviewed: 04/11/2013 Elsevier Interactive Patient Education [employer].

## 2016-09-05 NOTE — Progress Notes (Signed)
Safety precautions to be maintained throughout the outpatient stay will include: orient to surroundings, keep bed in low position, maintain call bell within reach at all times, provide assistance with transfer out of bed and ambulation.  

## 2016-09-06 ENCOUNTER — Telehealth: Payer: Self-pay | Admitting: *Deleted

## 2016-09-06 NOTE — Telephone Encounter (Signed)
No problems post procedure. 

## 2016-09-07 ENCOUNTER — Ambulatory Visit: Payer: BLUE CROSS/BLUE SHIELD | Attending: Internal Medicine | Admitting: Physical Therapy

## 2016-09-07 DIAGNOSIS — M545 Low back pain: Secondary | ICD-10-CM | POA: Diagnosis present

## 2016-09-07 DIAGNOSIS — R262 Difficulty in walking, not elsewhere classified: Secondary | ICD-10-CM | POA: Diagnosis not present

## 2016-09-07 DIAGNOSIS — G8929 Other chronic pain: Secondary | ICD-10-CM

## 2016-09-07 NOTE — Patient Instructions (Addendum)
PELVIC FLOOR / KEGEL EXERCISES   Pelvic floor/ Kegel exercises are used to strengthen the muscles in the base of your pelvis that are responsible for supporting your pelvic organs and preventing urine/feces leakage. Based on your therapist's recommendations, they can be performed while standing, sitting, or lying down.  Make yourself aware of this muscle group by using these cues:  Imagine you are in a crowded room and you feel the need to pass gas. Your response is to pull up and in at the rectum.  Close the rectum. Pull the muscles up inside your body,feeling your scrotum lifting as well . Feel the pelvic floor muscles lift as if you were walking into a cold lake.  Place your hand on top of your pubic bone. Tighten and draw in the muscles around the anal muscles without squeezing the buttock muscles.  Common Errors:  Breath holding: If you are holding your breath, you may be bearing down against your bladder instead of pulling it up. If you belly bulges up while you are squeezing, you are holding your breath. Be sure to breathe gently in and out while exercising. Counting out loud may help you avoid holding your breath.  Accessory muscle use: You should not see or feel other muscle movement when performing pelvic floor exercises. When done properly, no one can tell that you are performing the exercises. Keep the buttocks, belly and inner thighs relaxed.  Overdoing it: Your muscles can fatigue and stop working for you if you over-exercise. You may actually leak more or feel soreness at the lower abdomen or rectum.  YOUR HOME EXERCISE PROGRAM    SHORT HOLDS: Position: on back WITH PILLOW UNDER HIP and adjust your back to lengthen it (less arching)   Inhale and then exhale. Then squeeze the muscle.  (Be sure to let belly sink in with exhales and not push outward)  Perform 10 repetitions, 2  Times/day  **ALSO SQUEEZE BEFORE YOUR SNEEZE, COUGH, LAUGH to decrease downward pressure    **ALSO EXHALE BEFORE YOU RISE AGAINST GRAVITY (lifting, sit to stand, from squat to stand)   _______________________   Geryl Councilman tucks before the point of pain to neutral chin position  Laying on your back  10 reps

## 2016-09-08 NOTE — Therapy (Addendum)
[place] MAIN [facility] SERVICES [address] [place], [place], [address] Phone: [phone]   Fax:  [phone]  Physical Therapy Treatment  Patient Details  Name: Nicole Walter MRN: [id number] Date of Birth: Sep 09, 1961 No Data Recorded  Encounter Date: 09/07/2016      PT End of Session - 09/07/16 1423    Visit Number 10   Number of Visits 17   Date for PT Re-Evaluation 09/13/16   Authorization Type blue cross blue shield   PT Start Time 1330   PT Stop Time 1425   PT Time Calculation (min) 55 min   Activity Tolerance Patient tolerated treatment well;No increased pain   Behavior During Therapy WFL for tasks assessed/performed      Past Medical History:  Diagnosis Date  . Abnormal liver enzymes 02/08/2016  . Allergy   . Anxiety   . Chest pain 07/05/2016  . Chronic pain   . Depression   . DJD (degenerative joint disease)    lumbar  . Fibromyalgia   . High cholesterol   . Rash of back 01/28/2016    Past Surgical History:  Procedure Laterality Date  . ANKLE SURGERY     x 2  . CHOLECYSTECTOMY    . COLONOSCOPY    . COLONOSCOPY WITH PROPOFOL N/A 02/22/2016   Procedure: COLONOSCOPY WITH PROPOFOL;  Surgeon: Lollie Sails, MD;  Location: [facility] ENDOSCOPY;  Service: Endoscopy;  Laterality: N/A;  . ESOPHAGOGASTRODUODENOSCOPY (EGD) WITH PROPOFOL N/A 02/22/2016   Procedure: ESOPHAGOGASTRODUODENOSCOPY (EGD) WITH PROPOFOL;  Surgeon: Lollie Sails, MD;  Location: [facility] ENDOSCOPY;  Service: Endoscopy;  Laterality: N/A;  . NASAL SINUS SURGERY    . TONSILLECTOMY    . TUBAL LIGATION      There were no vitals filed for this visit.      Subjective Assessment - 09/07/16 1336    Subjective Pt had a cortisone shot in R side of her neck on 09/05/16 and it seems to be helping. Since last session, pt experienced increased pain in her R hip and socket. But pt also reported it rained at that time and maybe the pain was also related to the weather.  Today, hip/ neck  pain 3/10 without radiating pain.       Pertinent History lumbar injection 3 months ago, bursa injection 2 months ago, depression, anxiety, fibromyaliga,left ankle surgery 04/2011 and 11/2011   Limitations Sitting;Lifting;Standing;Walking   How long can you sit comfortably? unable to sit comfortably   How long can you stand comfortably? 15 minutes   How long can you walk comfortably? 15 minutes   Diagnostic tests MRI 2014, MRI 04/2016, MRI 07/2016   Patient Stated Goals Patient wants to be able to get stronger to get back to work            [facility] PT Assessment - 09/07/16 1421      Palpation   Spinal mobility C5-6 with hypomobility                   Pelvic Floor Special Questions - 09/07/16 1415    External Perineal Exam R bulbospongiosus increased tenderness / tensions    Prolapse Anterior Wall  within introitus.more cranial position with pillow under hip   Pelvic Floor Internal Exam pt consented verbally without contraindications   Exam Type Vaginal   Palpation increased tensions/ tenderness at iliococcgyeus R, B proximal attachment of B ischiocavernosus     Strength fair squeeze, definite lift  with circumferential closure w/  pillow under hips   Strength # of reps 10   Strength # of seconds 1           [facility] Adult PT Treatment/Exercise - 09/07/16 1418      Therapeutic Activites    Therapeutic Activities --  see pt instruction     Neuro Re-ed    Neuro Re-ed Details  coordinate pelvic floor mm without abdominal overuse     Manual Therapy   Manual Therapy --  distraction at C5-6 and MWM with shoulder abd/add 110 deg   Manual therapy comments intravaingal techinques thiele massage and faciliation at problems areas indicated in assessment                 PT Education - 09/07/16 1422    Education provided Yes   Education Details HEP   Person(s) Educated Patient   Methods Explanation;Demonstration;Tactile cues;Verbal cues;Handout    Comprehension Verbalized understanding;Returned demonstration;Verbal cues required;Tactile cues required             PT Long Term Goals - 07/12/16 0943      PT LONG TERM GOAL #1   Title Patient will report a worst pain of 3/10 on VAS in back, thoracic spine and neck to improve tolerance with ADLs and reduced symptoms with activities.    Baseline 5/10   Time 8   Period Weeks   Status New     PT LONG TERM GOAL #2   Title Patient will be able to perform household work/ chores without increase in symptoms.   Time 8   Period Weeks   Status New     PT LONG TERM GOAL #3   Title Patient will tolerate sitting unsupported demonstrating erect sitting posture with minimal thoracic kyphosis for 15+ minutes with maximum of 3/10 back pain to demonstrate improved back extensor strength and improved sitting tolerance   Baseline 5 minutes   Time 8   Period Weeks   Status New     PT LONG TERM GOAL #4   Title Patient will increase six minute walk test distance to >1000 for progression to community ambulator and improve gait ability   Baseline 700 feet   Time 8   Period Weeks   Status New               Plan - 09/08/16 1523    Clinical Impression Statement Pt demo'd decreased pelvic floor mm tensions following Tx today. She progressed to proper pelvic floor lengthening and coordination. Pt also demo'd increased cervical mobility following Tx. Pt will continue to benefit from more manual Tx in order to increased lower cervical extensions. Pt currently was limited to shoulder adbuction to ~110 deg in supine due to limited cervical/ thoracic limitation. Pt is progressing well towards her goals.    Rehab Potential Fair   Clinical Impairments Affecting Rehab Potential chronic pain   PT Frequency 1x / week   PT Duration 8 weeks   PT Treatment/Interventions Aquatic Therapy;Electrical Stimulation;Moist Heat;Therapeutic activities;Gait training;Therapeutic exercise;Manual techniques    Consulted and Agree with Plan of Care Patient      Patient will benefit from skilled therapeutic intervention in order to improve the following deficits and impairments:  Decreased mobility, Difficulty walking, Pain, Decreased strength  Visit Diagnosis: Difficulty in walking, not elsewhere classified  Chronic left-sided low back pain without sciatica     Problem List Patient Active Problem List   Diagnosis Date Noted  . Elevated sedimentation rate 08/31/2016  . Elevated C-reactive protein (  CRP) 08/31/2016  . Chronic pain 07/20/2016  . Long term current use of opiate analgesic 07/20/2016  . Long term prescription opiate use 07/20/2016  . Opiate use 07/20/2016  . Chronic upper back pain (Location of Secondary source of pain) (Bilateral) (L>R) 07/20/2016  . Chronic shoulder pain (Location of Tertiary source of pain) (Bilateral) (L>R) 07/20/2016  . Chronic low back pain (Bilateral) (L>R) 07/20/2016  . Chronic knee pain (Right) 07/20/2016  . Chronic upper extremity pain (Left) 07/20/2016  . Chronic lower extremity pain (Bilateral) (L>R) 07/20/2016  . Chronic abdominal pain 07/20/2016  . Chronic hip pain (Bilateral) (L>R) 05/31/2016  . Chronic Greater trochanteric bursitis (Bilateral) (L>R) 05/31/2016  . Bursitis of right shoulder 05/17/2016  . Skin lesions 04/27/2016  . Hyperlipidemia 02/18/2016  . Cervical osteoarthritis 02/08/2016  . Fibromyalgia 02/08/2016  . Osteopenia 02/08/2016  . Thoracic back pain 01/28/2016  . Chronic neck pain (Location of Primary Source of Pain) (Bilateral) (L>R) 01/28/2016  . Concentration deficit 01/28/2016  . Lumbar spondylosis 01/06/2016  . Anxiety and depression 07/11/2015  . Chronic pain syndrome 07/11/2015  . Combined fat and carbohydrate induced hyperlipemia 07/11/2015    Jerl Mina ,PT, DPT, E-RYT  09/08/2016, 3:25 PM  [place] MAIN [facility] SERVICES [address] [place], [place],  [address] Phone: [phone]   Fax:  [phone]  Name: Nicole Walter MRN: [id number] Date of Birth: 1961-06-30

## 2016-09-14 ENCOUNTER — Encounter: Payer: Self-pay | Admitting: Family Medicine

## 2016-09-14 ENCOUNTER — Other Ambulatory Visit: Payer: Self-pay | Admitting: Family Medicine

## 2016-09-14 ENCOUNTER — Ambulatory Visit (INDEPENDENT_AMBULATORY_CARE_PROVIDER_SITE_OTHER): Payer: BLUE CROSS/BLUE SHIELD | Admitting: Family Medicine

## 2016-09-14 VITALS — BP 124/80 | HR 61 | Temp 97.9°F | Wt 176.0 lb

## 2016-09-14 DIAGNOSIS — F419 Anxiety disorder, unspecified: Secondary | ICD-10-CM

## 2016-09-14 DIAGNOSIS — F418 Other specified anxiety disorders: Secondary | ICD-10-CM | POA: Diagnosis not present

## 2016-09-14 DIAGNOSIS — E6609 Other obesity due to excess calories: Secondary | ICD-10-CM | POA: Diagnosis not present

## 2016-09-14 DIAGNOSIS — F329 Major depressive disorder, single episode, unspecified: Secondary | ICD-10-CM

## 2016-09-14 DIAGNOSIS — F32A Depression, unspecified: Secondary | ICD-10-CM

## 2016-09-14 DIAGNOSIS — Z6830 Body mass index (BMI) 30.0-30.9, adult: Secondary | ICD-10-CM

## 2016-09-14 DIAGNOSIS — G2581 Restless legs syndrome: Secondary | ICD-10-CM

## 2016-09-14 DIAGNOSIS — G894 Chronic pain syndrome: Secondary | ICD-10-CM | POA: Diagnosis not present

## 2016-09-14 DIAGNOSIS — E663 Overweight: Secondary | ICD-10-CM | POA: Insufficient documentation

## 2016-09-14 DIAGNOSIS — Z1231 Encounter for screening mammogram for malignant neoplasm of breast: Secondary | ICD-10-CM

## 2016-09-14 LAB — CBC
HCT: 38.7 % (ref 36.0–46.0)
Hemoglobin: 12.8 g/dL (ref 12.0–15.0)
MCHC: 33.2 g/dL (ref 30.0–36.0)
MCV: 89.7 fl (ref 78.0–100.0)
PLATELETS: 284 10*3/uL (ref 150.0–400.0)
RBC: 4.31 Mil/uL (ref 3.87–5.11)
RDW: 13.5 % (ref 11.5–15.5)
WBC: 7.2 10*3/uL (ref 4.0–10.5)

## 2016-09-14 LAB — FERRITIN: FERRITIN: 110 ng/mL (ref 10.0–291.0)

## 2016-09-14 NOTE — Assessment & Plan Note (Addendum)
Patient notes her chronic pain has been stable. She's following with pain management and they did an injection in her right neck that did help some. Vicodin has not been much benefit. I went over her hip x-ray results and her lab results from her last visit with them. Mild degenerative changes in her hips. Negative rheumatoid factor and ANA. Discussed having her continuing to follow with pain management. She does report some symptoms consistent with restless leg syndrome. These symptoms could be related to her anxiety as well. We will check iron levels and ferritin. If levels are low we will supplement.

## 2016-09-14 NOTE — Patient Instructions (Signed)
Nice to see you. Please continue to follow with pain management regarding your pain. Please contact the [facility] to get set up with a nutritionist. If you can start exercising several days a week that would be great. Please add more vegetables to your diet. You should try and decrease the amount of soda you drink. Please contact your psychiatrist and let them know that you have been drowsy with the Atarax. If you develop worsening anxiety, or develop thoughts of harming herself or others, or any new or changing symptoms please seek medical attention immediately.

## 2016-09-14 NOTE — Assessment & Plan Note (Signed)
Anxiety is worsened. No SI or HI. I encouraged her to contact her psychiatrist and let them know that the Atarax has made her excessively drowsy.

## 2016-09-14 NOTE — Assessment & Plan Note (Signed)
Patient with a BMI of just above 30. I discussed diet and exercise with her. She drinks an excessive amount of soda and advised that if she could decrease this it will help with her weight and may help with her overall discomfort. Discussed the need to add vegetables. She will contact the [facility] to follow-up with her nutritionist. She'll try to increase her physical activity as tolerated.

## 2016-09-14 NOTE — Progress Notes (Signed)
  Tommi Rumps, MD Phone: [phone]  Nicole Walter is a 55 y.o. female who presents today for follow-up.  Chronic pain: Has started with pain management. They gave her a shot in her left neck that has helped some. She notes she just hurts all over. She feels restless in her legs and arms any time she sits still. Notes her anxiety went through the roof with the restlessness that she felt. She's taking hydrocodone though there is not much benefit.  Anxiety/depression: Seeing psychiatry. They tried to put her back on Cymbalta which she did not tolerate. They placed her on Atarax though this is made her excessively drowsy. She notes significant anxiety recently. Some depression. No SI or HI.  Obesity: Patient notes she's been more active at home though not really exercising. She drinks a significant amount of soda. At least one 2 L a day. She does not get much vegetables. Had hot wings and loaded fries last night. Had Alfredo noodles and steak the prior night. Was seeing a nutritionist though has not seen them recently.  PMH: former smoker   ROS see history of present illness  Objective  Physical Exam Vitals:   09/14/16 0842  BP: 124/80  Pulse: 61  Temp: 97.9 F (36.6 C)    BP Readings from Last 3 Encounters:  09/14/16 124/80  09/05/16 (!) 113/58  08/31/16 (!) 109/59   Wt Readings from Last 3 Encounters:  09/14/16 176 lb (79.8 kg)  09/05/16 174 lb (78.9 kg)  08/31/16 172 lb (78 kg)    Physical Exam  Constitutional: No distress.  HENT:  Head: Normocephalic and atraumatic.  Cardiovascular: Normal rate, regular rhythm and normal heart sounds.   Pulmonary/Chest: Effort normal and breath sounds normal.  Musculoskeletal: She exhibits no edema.  Bilateral shoulders with no tenderness, full ROM with no pain, bilateral hips with no tenderness, full internal and external range of motion with no discomfort  Neurological: She is alert. Gait normal.  Skin: Skin is warm and dry.  She is not diaphoretic.     Assessment/Plan: Please see individual problem list.  Anxiety and depression Anxiety is worsened. No SI or HI. I encouraged her to contact her psychiatrist and let them know that the Atarax has made her excessively drowsy.  Chronic pain syndrome Patient notes her chronic pain has been stable. She's following with pain management and they did an injection in her right neck that did help some. Vicodin has not been much benefit. I went over her hip x-ray results and her lab results from her last visit with them. Mild degenerative changes in her hips. Negative rheumatoid factor and ANA. Discussed having her continuing to follow with pain management. She does report some symptoms consistent with restless leg syndrome. These symptoms could be related to her anxiety as well. We will check iron levels and ferritin. If levels are low we will supplement.  Obesity Patient with a BMI of just above 30. I discussed diet and exercise with her. She drinks an excessive amount of soda and advised that if she could decrease this it will help with her weight and may help with her overall discomfort. Discussed the need to add vegetables. She will contact the lifestyle [facility] to follow-up with her nutritionist. She'll try to increase her physical activity as tolerated.   Orders Placed This Encounter  Procedures  . Ferritin  . CBC  . Iron and TIBC    Tommi Rumps, MD [place]

## 2016-09-15 LAB — IRON AND TIBC
%SAT: 32 % (ref 11–50)
IRON: 99 ug/dL (ref 45–160)
TIBC: 314 ug/dL (ref 250–450)
UIBC: 215 ug/dL (ref 125–400)

## 2016-09-18 DIAGNOSIS — M7061 Trochanteric bursitis, right hip: Secondary | ICD-10-CM | POA: Insufficient documentation

## 2016-09-18 DIAGNOSIS — M7551 Bursitis of right shoulder: Secondary | ICD-10-CM | POA: Insufficient documentation

## 2016-09-20 ENCOUNTER — Encounter: Payer: Self-pay | Admitting: Pain Medicine

## 2016-09-20 ENCOUNTER — Ambulatory Visit: Payer: BLUE CROSS/BLUE SHIELD | Attending: Pain Medicine | Admitting: Pain Medicine

## 2016-09-20 VITALS — BP 113/65 | HR 67 | Temp 98.4°F | Resp 16 | Ht 64.0 in | Wt 175.0 lb

## 2016-09-20 DIAGNOSIS — M25511 Pain in right shoulder: Secondary | ICD-10-CM | POA: Insufficient documentation

## 2016-09-20 DIAGNOSIS — F329 Major depressive disorder, single episode, unspecified: Secondary | ICD-10-CM | POA: Diagnosis not present

## 2016-09-20 DIAGNOSIS — M542 Cervicalgia: Secondary | ICD-10-CM

## 2016-09-20 DIAGNOSIS — M25512 Pain in left shoulder: Secondary | ICD-10-CM | POA: Diagnosis present

## 2016-09-20 DIAGNOSIS — M8580 Other specified disorders of bone density and structure, unspecified site: Secondary | ICD-10-CM | POA: Insufficient documentation

## 2016-09-20 DIAGNOSIS — F419 Anxiety disorder, unspecified: Secondary | ICD-10-CM | POA: Insufficient documentation

## 2016-09-20 DIAGNOSIS — F1190 Opioid use, unspecified, uncomplicated: Secondary | ICD-10-CM | POA: Diagnosis not present

## 2016-09-20 DIAGNOSIS — R7982 Elevated C-reactive protein (CRP): Secondary | ICD-10-CM | POA: Insufficient documentation

## 2016-09-20 DIAGNOSIS — Z79899 Other long term (current) drug therapy: Secondary | ICD-10-CM | POA: Diagnosis not present

## 2016-09-20 DIAGNOSIS — M797 Fibromyalgia: Secondary | ICD-10-CM | POA: Insufficient documentation

## 2016-09-20 DIAGNOSIS — G894 Chronic pain syndrome: Secondary | ICD-10-CM | POA: Insufficient documentation

## 2016-09-20 DIAGNOSIS — E669 Obesity, unspecified: Secondary | ICD-10-CM | POA: Diagnosis not present

## 2016-09-20 DIAGNOSIS — G8929 Other chronic pain: Secondary | ICD-10-CM

## 2016-09-20 DIAGNOSIS — M25552 Pain in left hip: Secondary | ICD-10-CM | POA: Insufficient documentation

## 2016-09-20 DIAGNOSIS — M549 Dorsalgia, unspecified: Secondary | ICD-10-CM

## 2016-09-20 DIAGNOSIS — M25561 Pain in right knee: Secondary | ICD-10-CM | POA: Diagnosis not present

## 2016-09-20 DIAGNOSIS — M25551 Pain in right hip: Secondary | ICD-10-CM | POA: Diagnosis not present

## 2016-09-20 DIAGNOSIS — Z79891 Long term (current) use of opiate analgesic: Secondary | ICD-10-CM | POA: Diagnosis not present

## 2016-09-20 DIAGNOSIS — E7801 Familial hypercholesterolemia: Secondary | ICD-10-CM | POA: Diagnosis not present

## 2016-09-20 DIAGNOSIS — R109 Unspecified abdominal pain: Secondary | ICD-10-CM | POA: Insufficient documentation

## 2016-09-20 MED ORDER — HYDROCODONE-ACETAMINOPHEN 5-325 MG PO TABS: 1.0000 | ORAL_TABLET | Freq: Four times a day (QID) | ORAL | 0 refills | Status: DC | PRN

## 2016-09-20 NOTE — Patient Instructions (Signed)
You were given 3 prescriptions for Hydrocodone today. Epidural Steroid Injection Patient Information  Description: The epidural space surrounds the nerves as they exit the spinal cord.  In some patients, the nerves can be compressed and inflamed by a bulging disc or a tight spinal canal (spinal stenosis).  By injecting steroids into the epidural space, we can bring irritated nerves into direct contact with a potentially helpful medication.  These steroids act directly on the irritated nerves and can reduce swelling and inflammation which often leads to decreased pain.  Epidural steroids may be injected anywhere along the spine and from the neck to the low back depending upon the location of your pain.   After numbing the skin with local anesthetic (like Novocaine), a small needle is passed into the epidural space slowly.  You may experience a sensation of pressure while this is being done.  The entire block usually last less than 10 minutes.  Conditions which may be treated by epidural steroids:   Low back and leg pain  Neck and arm pain  Spinal stenosis  Post-laminectomy syndrome  Herpes zoster (shingles) pain  Pain from compression fractures  Preparation for the injection:  1. Do not eat any solid food or dairy products within 8 hours of your appointment.  2. You may drink clear liquids up to 3 hours before appointment.  Clear liquids include water, black coffee, juice or soda.  No milk or cream please. 3. You may take your regular medication, including pain medications, with a sip of water before your appointment  Diabetics should hold regular insulin (if taken separately) and take 1/2 normal NPH dos the morning of the procedure.  Carry some sugar containing items with you to your appointment. 4. A driver must accompany you and be prepared to drive you home after your procedure.  5. Bring all your current medications with your. 6. An IV may be inserted and sedation may be given at the  discretion of the physician.   7. A blood pressure cuff, EKG and other monitors will often be applied during the procedure.  Some patients may need to have extra oxygen administered for a short period. 8. You will be asked to provide medical information, including your allergies, prior to the procedure.  We must know immediately if you are taking blood thinners (like Coumadin/Warfarin)  Or if you are allergic to IV iodine contrast (dye). We must know if you could possible be pregnant.  Possible side-effects:  Bleeding from needle site  Infection (rare, may require surgery)  Nerve injury (rare)  Numbness & tingling (temporary)  Difficulty urinating (rare, temporary)  Spinal headache ( a headache worse with upright posture)  Light -headedness (temporary)  Pain at injection site (several days)  Decreased blood pressure (temporary)  Weakness in arm/leg (temporary)  Pressure sensation in back/neck (temporary)  Call if you experience:  Fever/chills associated with headache or increased back/neck pain.  Headache worsened by an upright position.  New onset weakness or numbness of an extremity below the injection site  Hives or difficulty breathing (go to the emergency room)  Inflammation or drainage at the infection site  Severe back/neck pain  Any new symptoms which are concerning to you  Please note:  Although the local anesthetic injected can often make your back or neck feel good for several hours after the injection, the pain will likely return.  It takes 3-7 days for steroids to work in the epidural space.  You may not notice any pain  relief for at least that one week.  If effective, we will often do a series of three injections spaced 3-6 weeks apart to maximally decrease your pain.  After the initial series, we generally will wait several months before considering a repeat injection of the same type.  If you have any questions, please call [phone] [facility]

## 2016-09-20 NOTE — Progress Notes (Signed)
Nursing Pain Medication Assessment:  Safety precautions to be maintained throughout the outpatient stay will include: orient to surroundings, keep bed in low position, maintain call bell within reach at all times, provide assistance with transfer out of bed and ambulation.  Medication Inspection Compliance: Pill count conducted under aseptic conditions, in front of the patient. Neither the pills nor the bottle was removed from the patient's sight at any time. Once count was completed pills were immediately returned to the patient in their original bottle.  Medication: See above Pill Count: 104 of 120 pills remain Bottle Appearance: Standard pharmacy container. Clearly labeled. Filled Date:11 / 09 / 2017 Medication last intake: hydrocodone 5/325mg  last taken at 09/20/16 12noon

## 2016-09-20 NOTE — Progress Notes (Signed)
Patient's Name: Nicole Walter  MRN: [id number]  Referring Provider: Leone Haven, MD  DOB: [birth year]  PCP: Leone Haven, MD  DOS: 09/20/2016  Note by: Kathlen Brunswick. Dossie Arbour, MD  Service setting: Ambulatory outpatient  Specialty: Interventional Pain Management  Location: [facility] (AMB) Pain Management Facility    Patient type: Established   Primary Reason(s) for Visit: Encounter for prescription drug management & post-procedure evaluation of chronic illness with mild to moderate exacerbation(Level of risk: moderate) CC: Back Pain (   lower back near left and thoiracic area near bra line ) and Shoulder Pain (right )  HPI  Nicole Walter is a 55 y.o. year old, female patient, who comes today for a post-procedure evaluation and medication management. She has Anxiety and depression; Chronic pain syndrome; Lumbar spondylosis; Combined fat and carbohydrate induced hyperlipemia; Thoracic back pain; Chronic neck pain (Location of Primary Source of Pain) (Bilateral) (L>R); Concentration deficit; Hyperlipidemia; Cervical osteoarthritis; Fibromyalgia; Osteopenia; Skin lesions; Chronic hip pain (Bilateral) (L>R); Bursitis of right shoulder; Chronic Greater trochanteric bursitis (Bilateral) (L>R); Long term current use of opiate analgesic; Long term prescription opiate use; Opiate use; Chronic upper back pain (Location of Secondary source of pain) (Bilateral) (L>R); Chronic shoulder pain (Location of Tertiary source of pain) (Bilateral) (L>R); Chronic low back pain (Bilateral) (L>R); Chronic knee pain (Right); Chronic upper extremity pain (Left); Chronic lower extremity pain (Bilateral) (L>R); Chronic abdominal pain; Elevated sedimentation rate; Elevated C-reactive protein (CRP); Obesity; Greater trochanteric bursitis (Right); and Subacromial bursitis of shoulder joint (Right) on her problem list. Her primarily concern today is the Back Pain (   lower back near left and thoiracic area near bra line ) and Shoulder Pain  (right )  Pain Assessment: Self-Reported Pain Score: 5 /10 Clinically the patient looks like a 2/10 Reported level is inconsistent with clinical observations. Information on the proper use of the pain score provided to the patient today. Pain Type: Chronic pain Pain Location: Back (lower back and thoracic area near bra line) Pain Orientation: Lower Pain Descriptors / Indicators: Constant, Aching, Throbbing Pain Frequency: Constant  Nicole Walter was last seen on 09/05/2016 for a procedure. During today's appointment we reviewed Nicole Walter's post-procedure results, as well as her outpatient medication regimen.  Further details on both, my assessment(s), as well as the proposed treatment plan, please see below.  Controlled Substance Pharmacotherapy Assessment REMS (Risk Evaluation and Mitigation Strategy)  Analgesic:Hydrocodone/APAP 5/325 one tablet by mouth 3 times a day (15 mg/dayof hydrocodone) MME/day:'15mg'$ /day Azalee Course, RN  09/20/2016 12:28 PM  Sign at close encounter Nursing Pain Medication Assessment:  Safety precautions to be maintained throughout the outpatient stay will include: orient to surroundings, keep bed in low position, maintain call bell within reach at all times, provide assistance with transfer out of bed and ambulation.  Medication Inspection Compliance: Pill count conducted under aseptic conditions, in front of the patient. Neither the pills nor the bottle was removed from the patient's sight at any time. Once count was completed pills were immediately returned to the patient in their original bottle.  Medication: See above Pill Count: 104 of 120 pills remain Bottle Appearance: Standard pharmacy container. Clearly labeled. Filled Date:11 / 09 / 2017 Medication last intake: hydrocodone 5/'325mg'$  last taken at 09/20/16 12noon   Pharmacokinetics: Liberation and absorption (onset of action): WNL Distribution (time to peak effect): WNL Metabolism and excretion  (duration of action): WNL         Pharmacodynamics: Desired effects: Analgesia: The patient reports >50% benefit.  Reported improvement in function: The patient reports medication allows her to accomplish basic ADLs. Clinically meaningful improvement in function (CMIF): Sustained CMIF goals met Perceived effectiveness: Described as relatively effective, allowing for increase in activities of daily living (ADL) Undesirable effects: Side-effects or Adverse reactions: None reported Monitoring: [place] PMP: Online review of the past 28-month period conducted. Compliant with practice rules and regulations List of all UDS test(s) done:  Lab Results  Component Value Date   SUMMARY FINAL 07/20/2016   Last UDS on record: No results found for: TOXASSSELUR UDS interpretation: Compliant          Medication Assessment Form: Reviewed. Patient indicates being compliant with therapy Treatment compliance: Compliant Risk Assessment Profile: Aberrant behavior: See prior evaluations. None observed or detected today Comorbid factors increasing risk of overdose: See prior notes. No additional risks detected today Risk of substance use disorder (SUD): Low Opioid Risk Tool (ORT) Total Score:    Interpretation Table:  Score <3 = Low Risk for SUD  Score between 4-7 = Moderate Risk for SUD  Score >8 = High Risk for Opioid Abuse   Risk Mitigation Strategies:  Patient Counseling: Covered Patient-Prescriber Agreement (PPA): Present and active  Notification to other healthcare providers: Done  Pharmacologic Plan: No change in therapy, at this time  Post-Procedure Assessment  09/05/2016 Procedure: Diagnostic right-sided C7-T1 cervical epidural steroid injection under fluoroscopic guidance and IV sedation Influential Factors: BMI: 30.04 kg/m Intra-procedural challenges: None observed Assessment challenges: None detected         Post-procedural side-effects, adverse reactions, or complications: None  reported Reported issues: None  Sedation: Sedation provided. When no sedatives are used, the analgesic levels obtained are directly associated to the effectiveness of the local anesthetics. However, when sedation is provided, the level of analgesia obtained during the initial 1 hour following the intervention, is believed to be the result of a combination of factors. These factors may include, but are not limited to: 1. The effectiveness of the local anesthetics used. 2. The effects of the analgesic(s) and/or anxiolytic(s) used. 3. The degree of discomfort experienced by the patient at the time of the procedure. 4. The patients ability and reliability in recalling and recording the events. 5. The presence and influence of possible secondary gains and/or psychosocial factors. Reported result: Relief experienced during the 1st hour after the procedure: 100 % (Ultra-Short Term Relief) Interpretative annotation: Analgesia during this period is likely to be Local Anesthetic and/or IV Sedative (Analgesic/Anxiolitic) related.          Effects of local anesthetic: The analgesic effects attained during this period are directly associated to the localized infiltration of local anesthetics and therefore cary significant diagnostic value as to the etiological location, or anatomical origin, of the pain. Expected duration of relief is directly dependent on the pharmacodynamics of the local anesthetic used. Long-acting (4-6 hours) anesthetics used.  Reported result: Relief during the next 4 to 6 hour after the procedure: 100 % (Short-Term Relief) Interpretative annotation: Complete relief would suggest area to be the source of the pain.          Long-term benefit: Defined as the period of time past the expected duration of local anesthetics. With the possible exception of prolonged sympathetic blockade from the local anesthetics, benefits during this period are typically attributed to, or associated with, other  factors such as analgesic sensory neuropraxia, antiinflammatory effects, or beneficial biochemical changes provided by agents other than the local anesthetics Reported result: Extended relief following procedure: 50 % (for  a few days ) (Long-Term Relief) Interpretative annotation: Good relief. This could suggest inflammation to be a significant component in the etiology to the pain.          Current benefits: Defined as persistent relief that continues at this point in time.   Reported results: Treated area: 50 % In addition, the patient reports improvement in function Interpretative annotation: Ongoing benefits would suggest effective therapeutic approach  Interpretation: Results would suggest that repeating the procedure may be necessary,          Laboratory Chemistry  Inflammation Markers Lab Results  Component Value Date   ESRSEDRATE 37 (H) 07/20/2016   CRP 2.1 (H) 07/20/2016   Renal Function Lab Results  Component Value Date   BUN 13 07/20/2016   CREATININE 0.85 07/20/2016   GFRAA >60 07/20/2016   GFRNONAA >60 07/20/2016   Hepatic Function Lab Results  Component Value Date   AST 31 07/20/2016   ALT 40 07/20/2016   ALBUMIN 4.5 07/20/2016   Electrolytes Lab Results  Component Value Date   NA 141 07/20/2016   K 4.1 07/20/2016   CL 104 07/20/2016   CALCIUM 9.6 07/20/2016   MG 2.1 07/20/2016   Pain Modulating Vitamins Lab Results  Component Value Date   25OHVITD1 58 07/20/2016   25OHVITD2 <1.0 07/20/2016   25OHVITD3 58 07/20/2016   VITAMINB12 987 (H) 07/20/2016   Coagulation Parameters Lab Results  Component Value Date   PLT 284.0 09/14/2016   Cardiovascular Lab Results  Component Value Date   HGB 12.8 09/14/2016   HCT 38.7 09/14/2016   Note: Lab results reviewed.  Recent Diagnostic Imaging Review  Dg C-arm 1-60 Min-no Report  Result Date: 09/05/2016 CLINICAL DATA: Assistance in needle guidance and placement for procedures requiring needle placement  in or near specific anatomical locations not easily accessible without such assistance. C-ARM 1-60 MINUTES Fluoroscopy was utilized by the requesting physician.  No radiographic interpretation.   Note: Imaging results reviewed.  Meds  The patient has a current medication list which includes the following prescription(s): vitamin d, fluocinonide ointment, fluticasone, hydrocodone-acetaminophen, hydrocodone-acetaminophen, hydrocodone-acetaminophen, hydroxyzine, multi-vitamins, rabeprazole, simvastatin, sucralfate, trazodone, and tizanidine.  Current Outpatient Prescriptions on File Prior to Visit  Medication Sig  . Cholecalciferol (VITAMIN D) 2000 units tablet Take 4,000 Units by mouth daily.  . fluocinonide ointment (LIDEX) 0.05 % APPLY TOPICALLY TWICE A DAY TO ITCHY AREAS ONLY. as needed  . fluticasone (FLONASE) 50 MCG/ACT nasal spray Place 2 sprays into both nostrils daily.   . hydrOXYzine (ATARAX/VISTARIL) 50 MG tablet take 1 tablet by mouth twice a day if needed  . simvastatin (ZOCOR) 20 MG tablet take 1 tablet by mouth at bedtime  . sucralfate (CARAFATE) 1 g tablet take 1 tablet by mouth three times a day if needed for abdominal pain  . traZODone (DESYREL) 50 MG tablet 2 TABLETS by mouth at bedtime  . tiZANidine (ZANAFLEX) 2 MG tablet 1 tablet 2-3 times a day only as needed for severe muscles spasm.   No current facility-administered medications on file prior to visit.    ROS  Constitutional: Denies any fever or chills Gastrointestinal: No reported hemesis, hematochezia, vomiting, or acute GI distress Musculoskeletal: Denies any acute onset joint swelling, redness, loss of ROM, or weakness Neurological: No reported episodes of acute onset apraxia, aphasia, dysarthria, agnosia, amnesia, paralysis, loss of coordination, or loss of consciousness  Allergies  Nicole Walter is allergic to food and other.  [place]  Drug: Nicole Walter  reports that she  does not use drugs. Alcohol:  reports that she  does not drink alcohol. Tobacco:  reports that she quit smoking about 14 months ago. She started smoking about 42 years ago. She uses smokeless tobacco. Medical:  has a past medical history of Abnormal liver enzymes (02/08/2016); Allergy; Anxiety; Chest pain (07/05/2016); Chronic pain; Depression; DJD (degenerative joint disease); Fibromyalgia; High cholesterol; and Rash of back (01/28/2016). Family: family history includes Arthritis in her father, paternal grandfather, and paternal grandmother; Cancer in her brother and father; Dementia in her sister; Diabetes in her sister; Drug abuse in her brother, brother, and sister; Heart disease in her mother; Post-traumatic stress disorder in her brother; Stroke in her mother.  Past Surgical History:  Procedure Laterality Date  . ANKLE SURGERY     x 2  . CHOLECYSTECTOMY    . COLONOSCOPY    . COLONOSCOPY WITH PROPOFOL N/A 02/22/2016   Procedure: COLONOSCOPY WITH PROPOFOL;  Surgeon: Lollie Sails, MD;  Location: [facility] ENDOSCOPY;  Service: Endoscopy;  Laterality: N/A;  . ESOPHAGOGASTRODUODENOSCOPY (EGD) WITH PROPOFOL N/A 02/22/2016   Procedure: ESOPHAGOGASTRODUODENOSCOPY (EGD) WITH PROPOFOL;  Surgeon: Lollie Sails, MD;  Location[facility] ENDOSCOPY;  Service: Endoscopy;  Laterality: N/A;  . NASAL SINUS SURGERY    . TONSILLECTOMY    . TUBAL LIGATION     Constitutional Exam  General appearance: Well nourished, well developed, and well hydrated. In no apparent acute distress Vitals:   09/20/16 1209  BP: 113/65  Pulse: 67  Resp: 16  Temp: 98.4 F (36.9 C)  TempSrc: Oral  SpO2: 100%  Weight: 175 lb (79.4 kg)  Height: '5\' 4"'$  (1.626 m)   BMI Assessment: Estimated body mass index is 30.04 kg/m as calculated from the following:   Height as of this encounter: '5\' 4"'$  (1.626 m).   Weight as of this encounter: 175 lb (79.4 kg).  BMI interpretation table: BMI level Category Range association with higher incidence of chronic pain  <18 kg/m2 Underweight    18.5-24.9 kg/m2 Ideal body weight   25-29.9 kg/m2 Overweight Increased incidence by 20%  30-34.9 kg/m2 Obese (Class I) Increased incidence by 68%  35-39.9 kg/m2 Severe obesity (Class II) Increased incidence by 136%  >40 kg/m2 Extreme obesity (Class III) Increased incidence by 254%   BMI Readings from Last 4 Encounters:  09/20/16 30.04 kg/m  09/14/16 30.21 kg/m  09/05/16 29.87 kg/m  08/31/16 29.52 kg/m   Wt Readings from Last 4 Encounters:  09/20/16 175 lb (79.4 kg)  09/14/16 176 lb (79.8 kg)  09/05/16 174 lb (78.9 kg)  08/31/16 172 lb (78 kg)  Psych/Mental status: Alert, oriented x 3 (person, place, & time) Eyes: PERLA Respiratory: No evidence of acute respiratory distress  Cervical Spine Exam  Inspection: No masses, redness, or swelling Alignment: Symmetrical Functional ROM: Improved after treatment Stability: No instability detected Muscle strength & Tone: Functionally intact Sensory: Movement-associated discomfort Palpation: Complains of area being tender to palpation  Upper Extremity (UE) Exam    Side: Right upper extremity  Side: Left upper extremity  Inspection: No masses, redness, swelling, or asymmetry  Inspection: No masses, redness, swelling, or asymmetry  Functional ROM: Unrestricted ROM          Functional ROM: Unrestricted ROM          Muscle strength & Tone: Functionally intact  Muscle strength & Tone: Functionally intact  Sensory: Unimpaired  Sensory: Unimpaired  Palpation: Non-contributory  Palpation: Non-contributory   Thoracic Spine Exam  Inspection: No masses, redness, or swelling Alignment:  Symmetrical Functional ROM: Unrestricted ROM Stability: No instability detected Sensory: Unimpaired Muscle strength & Tone: Functionally intact Palpation: Non-contributory  Lumbar Spine Exam  Inspection: No masses, redness, or swelling Alignment: Symmetrical Functional ROM: Unrestricted ROM Stability: No instability detected Muscle strength & Tone:  Functionally intact Sensory: Unimpaired Palpation: Non-contributory Provocative Tests: Lumbar Hyperextension and rotation test: evaluation deferred today       Patrick's Maneuver: evaluation deferred today              Gait & Posture Assessment  Ambulation: Unassisted Gait: Relatively normal for age and body habitus Posture: WNL   Lower Extremity Exam    Side: Right lower extremity  Side: Left lower extremity  Inspection: No masses, redness, swelling, or asymmetry  Inspection: No masses, redness, swelling, or asymmetry  Functional ROM: Unrestricted ROM          Functional ROM: Unrestricted ROM          Muscle strength & Tone: Functionally intact  Muscle strength & Tone: Functionally intact  Sensory: Unimpaired  Sensory: Unimpaired  Palpation: Complains of area being tender to palpation over the trochanteric bursa   Palpation: Non-contributory   Assessment  Primary Diagnosis & Pertinent Problem List: The primary encounter diagnosis was Chronic pain syndrome. Diagnoses of Long term current use of opiate analgesic, Long term prescription opiate use, Opiate use, Chronic neck pain (Location of Primary Source of Pain) (Bilateral) (L>R), Chronic upper back pain (Location of Secondary source of pain) (Bilateral) (L>R), Chronic shoulder pain (Location of Tertiary source of pain) (Bilateral) (L>R), and Chronic pain were also pertinent to this visit.  Visit Diagnosis: 1. Chronic pain syndrome   2. Long term current use of opiate analgesic   3. Long term prescription opiate use   4. Opiate use   5. Chronic neck pain (Location of Primary Source of Pain) (Bilateral) (L>R)   6. Chronic upper back pain (Location of Secondary source of pain) (Bilateral) (L>R)   7. Chronic shoulder pain (Location of Tertiary source of pain) (Bilateral) (L>R)   8. Chronic pain    Plan of Care  Pharmacotherapy (Medications Ordered): Meds ordered this encounter  Medications  . HYDROcodone-acetaminophen  (NORCO/VICODIN) 5-325 MG tablet    Sig: Take 1 tablet by mouth every 6 (six) hours as needed for moderate pain.    Dispense:  120 tablet    Refill:  0    Do not add this medication to the electronic "Automatic Refill" notification system. Patient may have prescription filled one day early if pharmacy is closed on scheduled refill date. Do not fill until: 10/14/16 To last until: 11/13/16  . HYDROcodone-acetaminophen (NORCO/VICODIN) 5-325 MG tablet    Sig: Take 1 tablet by mouth every 6 (six) hours as needed for moderate pain.    Dispense:  120 tablet    Refill:  0    Do not add this medication to the electronic "Automatic Refill" notification system. Patient may have prescription filled one day early if pharmacy is closed on scheduled refill date. Do not fill until: 11/13/16 To last until: 12/13/16  . HYDROcodone-acetaminophen (NORCO/VICODIN) 5-325 MG tablet    Sig: Take 1 tablet by mouth every 6 (six) hours as needed for moderate pain.    Dispense:  120 tablet    Refill:  0    Do not add this medication to the electronic "Automatic Refill" notification system. Patient may have prescription filled one day early if pharmacy is closed on scheduled refill date. Do not fill until: 12/13/16  To last until: 01/12/17   New Prescriptions   No medications on file   Medications administered today: Nicole Walter had no medications administered during this visit. Lab-work, procedure(s), and/or referral(s): Orders Placed This Encounter  Procedures  . Cervical Epidural Injection   Imaging and/or referral(s): None  Interventional therapies: Planned, scheduled, and/or pending:   Diagnostic left cervical epidural steroid injection #2 under fluoroscopic guidance and IV sedation.    Considering:   Diagnostic bilateral cervical facet block under fluoroscopic guidance and IV sedation.  Possible bilateral cervical facet radiofrequency ablation under fluoroscopic guidance and IV sedation.  Diagnostic  left-sided cervical epidural steroid injection under fluoroscopic guidance, with or without sedation.  Diagnostic bilateral intra-articular shoulder joint injection under fluoroscopic guidance, with or without sedation.  Diagnostic bilateral suprascapular nerve block under fluoroscopic guidance, with or without sedation.  Possible bilateral suprascapular nerve radiofrequency ablation under fluoroscopic guidance and IV sedation.  Diagnostic bilateral lumbar facet block under fluoroscopic guidance and IV sedation.  Possible bilateral lumbar facet radiofrequency ablation under fluoroscopic guidance and IV sedation.  Diagnostic bilateral intra-articular hip joint injection under fluoroscopic guidance, with or without sedation.  Diagnostic bilateral femoral and obturator tickler branch blocks under fluoroscopic guidance, with or without sedation.  Possible bilateral hip joint radiofrequency ablation  Diagnostic right intra-articular knee joint injection, without fluoroscopy or IV sedation.  Possible series of 5, right-sided, intra-articular knee joint injections with Hyalgan.  Possible right sided genicular nerve block under fluoroscopic guidance, with or without sedation.  Possible right sided genicular nerve radiofrequency ablation under fluoroscopic guidance and IV sedation.  Possible left-sided lumbar epidural steroid injection under fluoroscopic guidance, with or without sedation.    Palliative PRN treatment(s):   Not at this time.   Provider-requested follow-up: Return in about 3 months (around 12/21/2016) for Med-Mgmt, in addition, procedure, (ASAP).  Future Appointments Date Time Provider Loop  10/09/2016 8:00 AM Milinda Pointer, MD [facility] None  10/11/2016 11:00 AM Shin-Yiing Latty, PT [facility] None  10/17/2016 3:00 PM Leone Haven, MD [facility] None  10/17/2016 4:20 PM [facility]  11/01/2016 1:00 PM Shin-Yiing Aurea Graff, PT [facility] None  12/21/2016 8:00 AM  Milinda Pointer, MD [facility] None   Primary Care Physician: Leone Haven, MD Location: [facility] Note by: Kathlen Brunswick. Dossie Arbour, M.D, DABA, DABAPM, DABPM, DABIPP, FIPP  Pain Score Disclaimer: We use the NRS-11 scale. This is a self-reported, subjective measurement of pain severity with only modest accuracy. It is used primarily to identify changes within a particular patient. It must be understood that outpatient pain scales are significantly less accurate that those used for research, where they can be applied under ideal controlled circumstances with minimal exposure to variables. In reality, the score is likely to be a combination of pain intensity and pain affect, where pain affect describes the degree of emotional arousal or changes in action readiness caused by the sensory experience of pain. Factors such as social and work situation, setting, emotional state, anxiety levels, expectation, and prior pain experience may influence pain perception and show large inter-individual differences that may also be affected by time variables.  Patient instructions provided during this appointment: Patient Instructions  You were given 3 prescriptions for Hydrocodone today. Epidural Steroid Injection Patient Information  Description: The epidural space surrounds the nerves as they exit the spinal cord.  In some patients, the nerves can be compressed and inflamed by a bulging disc or a tight spinal canal (spinal stenosis).  By injecting steroids  into the epidural space, we can bring irritated nerves into direct contact with a potentially helpful medication.  These steroids act directly on the irritated nerves and can reduce swelling and inflammation which often leads to decreased pain.  Epidural steroids may be injected anywhere along the spine and from the neck to the low back depending upon the location of your pain.   After numbing the skin with local anesthetic (like  Novocaine), a small needle is passed into the epidural space slowly.  You may experience a sensation of pressure while this is being done.  The entire block usually last less than 10 minutes.  Conditions which may be treated by epidural steroids:   Low back and leg pain  Neck and arm pain  Spinal stenosis  Post-laminectomy syndrome  Herpes zoster (shingles) pain  Pain from compression fractures  Preparation for the injection:  1. Do not eat any solid food or dairy products within 8 hours of your appointment.  2. You may drink clear liquids up to 3 hours before appointment.  Clear liquids include water, black coffee, juice or soda.  No milk or cream please. 3. You may take your regular medication, including pain medications, with a sip of water before your appointment  Diabetics should hold regular insulin (if taken separately) and take 1/2 normal NPH dos the morning of the procedure.  Carry some sugar containing items with you to your appointment. 4. A driver must accompany you and be prepared to drive you home after your procedure.  5. Bring all your current medications with your. 6. An IV may be inserted and sedation may be given at the discretion of the physician.   7. A blood pressure cuff, EKG and other monitors will often be applied during the procedure.  Some patients may need to have extra oxygen administered for a short period. 8. You will be asked to provide medical information, including your allergies, prior to the procedure.  We must know immediately if you are taking blood thinners (like Coumadin/Warfarin)  Or if you are allergic to IV iodine contrast (dye). We must know if you could possible be pregnant.  Possible side-effects:  Bleeding from needle site  Infection (rare, may require surgery)  Nerve injury (rare)  Numbness & tingling (temporary)  Difficulty urinating (rare, temporary)  Spinal headache ( a headache worse with upright posture)  Light -headedness  (temporary)  Pain at injection site (several days)  Decreased blood pressure (temporary)  Weakness in arm/leg (temporary)  Pressure sensation in back/neck (temporary)  Call if you experience:  Fever/chills associated with headache or increased back/neck pain.  Headache worsened by an upright position.  New onset weakness or numbness of an extremity below the injection site  Hives or difficulty breathing (go to the emergency room)  Inflammation or drainage at the infection site  Severe back/neck pain  Any new symptoms which are concerning to you  Please note:  Although the local anesthetic injected can often make your back or neck feel good for several hours after the injection, the pain will likely return.  It takes 3-7 days for steroids to work in the epidural space.  You may not notice any pain relief for at least that one week.  If effective, we will often do a series of three injections spaced 3-6 weeks apart to maximally decrease your pain.  After the initial series, we generally will wait several months before considering a repeat injection of the same type.  If you have  any questions, please call [phone] [facility]

## 2016-09-27 ENCOUNTER — Encounter: Payer: BLUE CROSS/BLUE SHIELD | Admitting: Physical Therapy

## 2016-10-09 ENCOUNTER — Ambulatory Visit (HOSPITAL_BASED_OUTPATIENT_CLINIC_OR_DEPARTMENT_OTHER): Payer: BLUE CROSS/BLUE SHIELD | Admitting: Pain Medicine

## 2016-10-09 ENCOUNTER — Encounter: Payer: Self-pay | Admitting: Pain Medicine

## 2016-10-09 ENCOUNTER — Ambulatory Visit
Admission: RE | Admit: 2016-10-09 | Discharge: 2016-10-09 | Disposition: A | Discharge status: Home / Self Care | Payer: BLUE CROSS/BLUE SHIELD | Source: Ambulatory Visit | Attending: Pain Medicine | Admitting: Pain Medicine

## 2016-10-09 VITALS — BP 110/59 | HR 58 | Temp 97.9°F | Resp 16 | Ht 64.0 in | Wt 174.0 lb

## 2016-10-09 DIAGNOSIS — G8929 Other chronic pain: Secondary | ICD-10-CM

## 2016-10-09 DIAGNOSIS — M542 Cervicalgia: Secondary | ICD-10-CM | POA: Insufficient documentation

## 2016-10-09 DIAGNOSIS — Z9889 Other specified postprocedural states: Secondary | ICD-10-CM | POA: Diagnosis not present

## 2016-10-09 DIAGNOSIS — Z9049 Acquired absence of other specified parts of digestive tract: Secondary | ICD-10-CM | POA: Insufficient documentation

## 2016-10-09 DIAGNOSIS — M4722 Other spondylosis with radiculopathy, cervical region: Secondary | ICD-10-CM | POA: Insufficient documentation

## 2016-10-09 MED ORDER — LACTATED RINGERS IV SOLN
1000.0000 mL | Freq: Once | INTRAVENOUS | Status: AC
  Administered 2016-10-09: 1000 mL via INTRAVENOUS

## 2016-10-09 MED ORDER — DEXAMETHASONE SODIUM PHOSPHATE 4 MG/ML IJ SOLN: 10.0000 mg | Freq: Once | INTRAMUSCULAR | Status: DC

## 2016-10-09 MED ORDER — FENTANYL CITRATE (PF) 100 MCG/2ML IJ SOLN
25.0000 ug | INTRAMUSCULAR | Status: DC | PRN
  Filled 2016-10-09: qty 2

## 2016-10-09 MED ORDER — DEXAMETHASONE SODIUM PHOSPHATE 4 MG/ML IJ SOLN
INTRAMUSCULAR | Status: AC
  Administered 2016-10-09: 10 mg
  Filled 2016-10-09: qty 3

## 2016-10-09 MED ORDER — DEXAMETHASONE SODIUM PHOSPHATE 4 MG/ML IJ SOLN
10.0000 mg | Freq: Once | INTRAMUSCULAR | Status: AC
Start: 2016-10-09 — End: 2016-10-09
  Administered 2016-10-09: 10 mg

## 2016-10-09 MED ORDER — IOPAMIDOL (ISOVUE-M 200) INJECTION 41%
10.0000 mL | Freq: Once | INTRAMUSCULAR | Status: AC
  Administered 2016-10-09: 10 mL via EPIDURAL
  Filled 2016-10-09: qty 10

## 2016-10-09 MED ORDER — DEXAMETHASONE SODIUM PHOSPHATE 10 MG/ML IJ SOLN
10.0000 mg | Freq: Once | INTRAMUSCULAR | Status: AC
  Administered 2016-10-09: 10 mg
  Filled 2016-10-09: qty 1

## 2016-10-09 MED ORDER — ROPIVACAINE HCL 2 MG/ML IJ SOLN
2.0000 mL | Freq: Once | INTRAMUSCULAR | Status: AC
  Administered 2016-10-09: 2 mL via EPIDURAL
  Filled 2016-10-09: qty 10

## 2016-10-09 MED ORDER — SODIUM CHLORIDE 0.9% FLUSH
2.0000 mL | Freq: Once | INTRAVENOUS | Status: AC
  Administered 2016-10-09: 2 mL

## 2016-10-09 MED ORDER — LIDOCAINE HCL (PF) 1 % IJ SOLN
10.0000 mL | Freq: Once | INTRAMUSCULAR | Status: AC
  Administered 2016-10-09: 10 mL
  Filled 2016-10-09: qty 10

## 2016-10-09 MED ORDER — SODIUM CHLORIDE 0.9 % IJ SOLN
INTRAMUSCULAR | Status: AC
  Administered 2016-10-09: 09:00:00
  Filled 2016-10-09: qty 20

## 2016-10-09 MED ORDER — MIDAZOLAM HCL 5 MG/5ML IJ SOLN
1.0000 mg | INTRAMUSCULAR | Status: DC | PRN
  Filled 2016-10-09: qty 5

## 2016-10-09 NOTE — Patient Instructions (Signed)
Pain Management Discharge Instructions  General Discharge Instructions :  If you need to reach your doctor call: Monday-Friday 8:00 am - 4:00 pm at [phone] or toll free [phone].  After clinic hours [phone] to have operator reach doctor.  Bring all of your medication bottles to all your appointments in the pain clinic.  To cancel or reschedule your appointment with Pain Management please remember to call 24 hours in advance to avoid a fee.  Refer to the educational materials which you have been given on: General Risks, I had my Procedure. Discharge Instructions, Post Sedation.  Post Procedure Instructions:  The drugs you were given will stay in your system until tomorrow, so for the next 24 hours you should not drive, make any legal decisions or drink any alcoholic beverages.  You may eat anything you prefer, but it is better to start with liquids then soups and crackers, and gradually work up to solid foods.  Please notify your doctor immediately if you have any unusual bleeding, trouble breathing or pain that is not related to your normal pain.  Depending on the type of procedure that was done, some parts of your body may feel week and/or numb.  This usually clears up by tonight or the next day.  Walk with the use of an assistive device or accompanied by an adult for the 24 hours.  You may use ice on the affected area for the first 24 hours.  Put ice in a Ziploc bag and cover with a towel and place against area 15 minutes on 15 minutes off.  You may switch to heat after 24 hours.Epidural Steroid Injection An epidural steroid injection is a shot of steroid medicine and numbing medicine that is given into the space between the spinal cord and the bones in your back (epidural space). The shot helps relieve pain caused by an irritated or swollen nerve root. The amount of pain relief you get from the injection depends on what is causing the nerve to be swollen and irritated,  and how long your pain lasts. You are more likely to benefit from this injection if your pain is strong and comes on suddenly rather than if you have had pain for a long time. Tell a health care provider about:  Any allergies you have.  All medicines you are taking, including vitamins, herbs, eye drops, creams, and over-the-counter medicines.  Any problems you or family members have had with anesthetic medicines.  Any blood disorders you have.  Any surgeries you have had.  Any medical conditions you have.  Whether you are pregnant or may be pregnant. What are the risks? Generally, this is a safe procedure. However, problems may occur, including:  Headache.  Bleeding.  Infection.  Allergic reaction to medicines.  Damage to your nerves. What happens before the procedure? Staying hydrated  Follow instructions from your health care provider about hydration, which may include:  Up to 2 hours before the procedure - you may continue to drink clear liquids, such as water, clear fruit juice, black coffee, and plain tea. Eating and drinking restrictions  Follow instructions from your health care provider about eating and drinking, which may include:  8 hours before the procedure - stop eating heavy meals or foods such as meat, fried foods, or fatty foods.  6 hours before the procedure - stop eating light meals or foods, such as toast or cereal.  6 hours before the procedure - stop drinking milk or drinks that contain milk.    2 hours before the procedure - stop drinking clear liquids. Medicine  You may be given medicines to lower anxiety.  Ask your health care provider about:  Changing or stopping your regular medicines. This is especially important if you are taking diabetes medicines or blood thinners.  Taking medicines such as aspirin and ibuprofen. These medicines can thin your blood. Do not take these medicines before your procedure if your health care provider instructs  you not to. General instructions  Plan to have someone take you home from the hospital or clinic. What happens during the procedure?  You may receive a medicine to help you relax (sedative).  You will be asked to lie on your abdomen.  The injection site will be cleaned.  A numbing medicine (local anesthetic) will be used to numb the injection site.  A needle will be inserted through your skin into the epidural space. You may feel some discomfort when this happens. An X-ray machine will be used to make sure the needle is put as close as possible to the affected nerve.  A steroid medicine and a local anesthetic will be injected into the epidural space.  The needle will be removed.  A bandage (dressing) will be put over the injection site. What happens after the procedure?  Your blood pressure, heart rate, breathing rate, and blood oxygen level will be monitored until the medicines you were given have worn off.  Your arm or leg may feel weak or numb for a few hours.  The injection site may feel sore.  Do not drive for 24 hours if you received a sedative. This information is not intended to replace advice given to you by your health care provider. Make sure you discuss any questions you have with your health care provider. Document Released: 01/30/2008 Document Revised: 04/05/2016 Document Reviewed: 02/08/2016 [employer] Interactive Patient Education  2017 [employer]  

## 2016-10-09 NOTE — Progress Notes (Signed)
Safety precautions to be maintained throughout the outpatient stay will include: orient to surroundings, keep bed in low position, maintain call bell within reach at all times, provide assistance with transfer out of bed and ambulation.  

## 2016-10-09 NOTE — Progress Notes (Signed)
Patient's Name: Nicole Walter  MRN: [id number]  Referring Provider: Leone Haven, MD  DOB: 12/04/1960  PCP: Leone Haven, MD  DOS: 10/09/2016  Note by: Kathlen Brunswick. Dossie Arbour, MD  Service setting: Ambulatory outpatient  Location: [facility] (AMB) Pain Management Facility  Visit type: Procedure  Specialty: Interventional Pain Management  Patient type: Established   Primary Reason for Visit: Interventional Pain Management Treatment. CC: Neck Pain (left)  Procedure:  Anesthesia, Analgesia, Anxiolysis:  Type: Diagnostic, Inter-Laminar, Epidural Steroid Injection Region: Posterior Cervico-thoracic Region Level: C7-T1 Laterality: Left-Sided Paramedial  Type: Local Anesthesia with Moderate (Conscious) Sedation Local Anesthetic: Lidocaine 1% Route: Intravenous (IV) IV Access: Secured Sedation: Meaningful verbal contact was maintained at all times during the procedure  Indication(s): Analgesia and Anxiety  Indications: 1. Osteoarthritis of spine with radiculopathy, cervical region   2. Chronic neck pain (Location of Primary Source of Pain) (Bilateral) (L>R)    Pain Score: Pre-procedure: 5 /10 Post-procedure: 0-No pain/10  Pre-Procedure Assessment:  Nicole Walter is a 55 y.o. (year old), female patient, seen today for interventional treatment. She  has a past surgical history that includes Cholecystectomy; Tubal ligation; Ankle surgery; Nasal sinus surgery; Tonsillectomy; Colonoscopy; Esophagogastroduodenoscopy (egd) with propofol (N/A, 02/22/2016); and Colonoscopy with propofol (N/A, 02/22/2016).. Her primarily concern today is the Neck Pain (left) The primary encounter diagnosis was Osteoarthritis of spine with radiculopathy, cervical region. A diagnosis of Chronic neck pain (Location of Primary Source of Pain) (Bilateral) (L>R) was also pertinent to this visit.  Pain Type: Chronic pain Pain Location: Neck Pain Orientation: Left Pain Descriptors / Indicators: Constant, Aching, Throbbing Pain  Frequency: Constant  Date of Last Visit: 09/20/16 Service Provided on Last Visit: Med Refill  Coagulation Parameters Lab Results  Component Value Date   PLT 284.0 09/14/2016   Verification of the correct person, correct site (including marking of site), and correct procedure were performed and confirmed by the patient.  Consent: Before the procedure and under the influence of no sedative(s), amnesic(s), or anxiolytics, the patient was informed of the treatment options, risks and possible complications. To fulfill our ethical and legal obligations, as recommended by the American Medical Association's Code of Ethics, I have informed the patient of my clinical impression; the nature and purpose of the treatment or procedure; the risks, benefits, and possible complications of the intervention; the alternatives, including doing nothing; the risk(s) and benefit(s) of the alternative treatment(s) or procedure(s); and the risk(s) and benefit(s) of doing nothing. The patient was provided information about the general risks and possible complications associated with the procedure. These may include, but are not limited to: failure to achieve desired goals, infection, bleeding, organ or nerve damage, allergic reactions, paralysis, and death. In addition, the patient was informed of those risks and complications associated to Spine-related procedures, such as failure to decrease pain; infection (i.e.: Meningitis, epidural or intraspinal abscess); bleeding (i.e.: epidural hematoma, subarachnoid hemorrhage, or any other type of intraspinal or peri-dural bleeding); organ or nerve damage (i.e.: Any type of peripheral nerve, nerve root, or spinal cord injury) with subsequent damage to sensory, motor, and/or autonomic systems, resulting in permanent pain, numbness, and/or weakness of one or several areas of the body; allergic reactions; (i.e.: anaphylactic reaction); and/or death. Furthermore, the patient was informed  of those risks and complications associated with the medications. These include, but are not limited to: allergic reactions (i.e.: anaphylactic or anaphylactoid reaction(s)); adrenal axis suppression; blood sugar elevation that in diabetics may result in ketoacidosis or comma; water retention that in patients  with history of congestive heart failure may result in shortness of breath, pulmonary edema, and decompensation with resultant heart failure; weight gain; swelling or edema; medication-induced neural toxicity; particulate matter embolism and blood vessel occlusion with resultant organ, and/or nervous system infarction; and/or aseptic necrosis of one or more joints. Finally, the patient was informed that Medicine is not an exact science; therefore, there is also the possibility of unforeseen or unpredictable risks and/or possible complications that may result in a catastrophic outcome. The patient indicated having understood very clearly. We have given the patient no guarantees and we have made no promises. Enough time was given to the patient to ask questions, all of which were answered to the patient's satisfaction. Nicole Walter has indicated that she wanted to continue with the procedure.  Consent Attestation: I, the ordering provider, attest that I have discussed with the patient the benefits, risks, side-effects, alternatives, likelihood of achieving goals, and potential problems during recovery for the procedure that I have provided informed consent.  Pre-Procedure Preparation:  Safety Precautions: Allergies reviewed. The patient was asked about blood thinners, or active infections, both of which were denied. The patient was asked to confirm the procedure and laterality, before marking the site, and again before commencing the procedure. Appropriate site, procedure, and patient were confirmed by following the Joint Commission's Universal Protocol (UP.01.01.01), in the form of a "Time Out". The patient  was asked to participate by confirming the accuracy of the "Time Out" information. Patient was assessed for positional comfort and pressure points before starting the procedure. Allergies: She is allergic to food and other. Allergy Precautions: None required Infection Control Precautions: Sterile technique used. Standard Universal Precautions were taken as recommended by the Department of [facility] for Disease Control and Prevention (CDC). Standard pre-surgical skin prep was conducted. Respiratory hygiene and cough etiquette was practiced. Hand hygiene observed. Safe injection practices and needle disposal techniques followed. SDV (single dose vial) medications used. Medications properly checked for expiration dates and contaminants. Personal protective equipment (PPE) used as per protocol. Monitoring:  As per clinic protocol. Vitals:   10/09/16 0930 10/09/16 0935 10/09/16 0940 10/09/16 0950  BP: 113/72 (!) 103/57 110/62 109/72  Pulse: 62 62 63 (!) 57  Resp: 15 13 15 16   Temp:    97.9 F (36.6 C)  TempSrc:      SpO2: 95% 95% 97% 94%  Weight:      Height:      Calculated BMI: Body mass index is 29.87 kg/m. Time-out: "Time-out" completed before starting procedure, as per protocol.  Description of Procedure Process:   Time-out: "Time-out" completed before starting procedure, as per protocol. Position: Prone with head of the table was raised to facilitate breathing. Target Area: For Epidural Steroid injections the target is the interlaminar space, initially targeting the lower border of the superior vertebral body lamina. Approach: Paramedial approach. Area Prepped: Entire PosteriorCervical Region Prepping solution: ChloraPrep (2% chlorhexidine gluconate and 70% isopropyl alcohol) Safety Precautions: Aspiration looking for blood return was conducted prior to all injections. At no point did we inject any substances, as a needle was being advanced. No attempts were made at seeking  any paresthesias. Safe injection practices and needle disposal techniques used. Medications properly checked for expiration dates. SDV (single dose vial) medications used. Description of the Procedure: Protocol guidelines were followed. The procedure needle was introduced through the skin, ipsilateral to the reported pain, and advanced to the target area. Bone was contacted and the needle walked caudad, until the  lamina was cleared. The epidural space was identified using "loss-of-resistance technique" with 2-3 ml of PF-NaCl (0.9% NSS), in a 5cc LOR glass syringe. EBL: None Materials & Medications:  Needle(s) Used: 20g - 10cm, Tuohy-style epidural needle Medication(s): see below.  Imaging Guidance (Spinal):  Type of Imaging Technique: Fluoroscopy Guidance (Spinal) Indication(s): Assistance in needle guidance and placement for procedures requiring needle placement in or near specific anatomical locations not easily accessible without such assistance. Exposure Time: Please see nurses notes. Contrast: Before injecting any contrast, we confirmed that the patient did not have an allergy to iodine, shellfish, or radiological contrast. Once satisfactory needle placement was completed at the desired level, radiological contrast was injected. Contrast injected under live fluoroscopy. No contrast complications. See chart for type and volume of contrast used. Fluoroscopic Guidance: I was personally present during the use of fluoroscopy. "Tunnel Vision Technique" used to obtain the best possible view of the target area. Parallax error corrected before commencing the procedure. "Direction-depth-direction" technique used to introduce the needle under continuous pulsed fluoroscopy. Once target was reached, antero-posterior, oblique, and lateral fluoroscopic projection used confirm needle placement in all planes. Images permanently stored in EMR. Interpretation: I personally interpreted the imaging intraoperatively.  Adequate needle placement confirmed in multiple planes. Appropriate spread of contrast into desired area was observed. No evidence of afferent or efferent intravascular uptake. No intrathecal or subarachnoid spread observed. Permanent images saved into the patient's record.  Antibiotic Prophylaxis:  Indication(s): No indications identified. Type:  Antibiotics Given (last 72 hours)    None      Post-operative Assessment:  Complications: No immediate post-treatment complications observed by team, or reported by patient. Disposition: The patient tolerated the entire procedure well. A repeat set of vitals were taken after the procedure and the patient was kept under observation following institutional policy, for this type of procedure. Post-procedural neurological assessment was performed, showing return to baseline, prior to discharge. The patient was provided with post-procedure discharge instructions, including a section on how to identify potential problems. Should any problems arise concerning this procedure, the patient was given instructions to immediately contact us, at any time, without hesitation. In any case, we plan to contact the patient by telephone for a follow-up status report regarding this interventional procedure. Comments:  No additional relevant information.  Plan of Care  Discharge to: Discharge home  Medications ordered for procedure: Meds ordered this encounter  Medications  . iopamidol (ISOVUE-M) 41 % intrathecal injection 10 mL  . dexamethasone (DECADRON) injection 10 mg  . ropivacaine (PF) 2 mg/mL (0.2%) (NAROPIN) injection 2 mL  . sodium chloride flush (NS) 0.9 % injection 2 mL  . lidocaine (PF) (XYLOCAINE) 1 % injection 10 mL  . fentaNYL (SUBLIMAZE) injection 25-50 mcg    Make sure Narcan is available in the pyxis when using this medication. In the event of respiratory depression (RR< 8/min): Titrate NARCAN (naloxone) in increments of 0.1 to 0.2 mg IV at 2-3  minute intervals, until desired degree of reversal.  . lactated ringers infusion 1,000 mL  . midazolam (VERSED) 5 MG/5ML injection 1-2 mg    Make sure Flumazenil is available in the pyxis when using this medication. If oversedation occurs, administer 0.2 mg IV over 15 sec. If after 45 sec no response, administer 0.2 mg again over 1 min; may repeat at 1 min intervals; not to exceed 4 doses (1 mg)  . sodium chloride 0.9 % injection    GARNER, CYNTHIA: cabinet override  . DISCONTD: dexamethasone (DECADRON) injection  10 mg  . dexamethasone (DECADRON) 4 MG/ML injection    GARNER, CYNTHIA: cabinet override  . dexamethasone (DECADRON) injection 10 mg   Medications administered: (For more details, see medical record) We administered iopamidol, dexamethasone, ropivacaine (PF) 2 mg/mL (0.2%), sodium chloride flush, lidocaine (PF), lactated ringers, sodium chloride, dexamethasone, and dexamethasone. Lab-work, Procedure(s), & Referral(s) Ordered: Orders Placed This Encounter  Procedures  . Cervical Epidural Injection  . DG C-Arm 1-60 Min-No Report   Imaging Ordered: Results for orders placed in visit on 09/05/16  DG C-Arm 1-60 Min-No Report   Narrative CLINICAL DATA: Assistance in needle guidance and placement for procedures  requiring needle placement in or near specific anatomical locations not  easily accessible without such assistance.   C-ARM 1-60 MINUTES  Fluoroscopy was utilized by the requesting physician.  No radiographic  interpretation.     New Prescriptions   No medications on file   Physician-requested Follow-up:  Return in about 2 weeks (around 10/23/2016) for Post-Procedure evaluation.  Future Appointments Date Time Provider [place]  10/11/2016 11:00 AM Shin-Yiing [place], [place] [facility] None  10/17/2016 3:00 PM Leone Haven, MD [facility] None  10/17/2016 4:20 PM [facility]  11/01/2016 1:00 PM Shin-Yiing Aurea Graff, PT [facility] None  11/20/2016 1:15 PM  Milinda Pointer, MD [facility] None  12/21/2016 8:00 AM Milinda Pointer, MD [facility] None   Primary Care Physician: Leone Haven, MD Location: [facility] Note by: Kathlen Brunswick. Dossie Arbour, M.D, DABA, DABAPM, DABPM, DABIPP, FIPP 10/09/1709:00 AM  Disclaimer:  Medicine is not an Chief Strategy Officer. The only guarantee in medicine is that nothing is guaranteed. It is important to note that the decision to proceed with this intervention was based on the information collected from the patient. The Data and conclusions were drawn from the patient's questionnaire, the interview, and the physical examination. Because the information was provided in large part by the patient, it cannot be guaranteed that it has not been purposely or unconsciously manipulated. Every effort has been made to obtain as much relevant data as possible for this evaluation. It is important to note that the conclusions that lead to this procedure are derived in large part from the available data. Always take into account that the treatment will also be dependent on availability of resources and existing treatment guidelines, considered by other Pain Management Practitioners as being common knowledge and practice, at the time of the intervention. For Medico-Legal purposes, it is also important to point out that variation in procedural techniques and pharmacological choices are the acceptable norm. The indications, contraindications, technique, and results of the above procedure should only be interpreted and judged by a Board-Certified Interventional Pain Specialist with extensive familiarity and expertise in the same exact procedure and technique. Attempts at providing opinions without similar or greater experience and expertise than that of the treating physician will be considered as inappropriate and unethical, and shall result in a formal complaint to the state medical board and applicable specialty  societies.  Instructions provided at this appointment: Patient Instructions  Pain Management Discharge Instructions  General Discharge Instructions :  If you need to reach your doctor call: Monday-Friday 8:00 am - 4:00 pm at [phone] or toll free [phone].  After clinic hours [phone] to have operator reach doctor.  Bring all of your medication bottles to all your appointments in the pain clinic.  To cancel or reschedule your appointment with Pain Management please remember to call 24 hours in advance to avoid a fee.  Refer to the educational materials which you have been given on: General Risks, I had my Procedure. Discharge Instructions, Post Sedation.  Post Procedure Instructions:  The drugs you were given will stay in your system until tomorrow, so for the next 24 hours you should not drive, make any legal decisions or drink any alcoholic beverages.  You may eat anything you prefer, but it is better to start with liquids then soups and crackers, and gradually work up to solid foods.  Please notify your doctor immediately if you have any unusual bleeding, trouble breathing or pain that is not related to your normal pain.  Depending on the type of procedure that was done, some parts of your body may feel week and/or numb.  This usually clears up by tonight or the next day.  Walk with the use of an assistive device or accompanied by an adult for the 24 hours.  You may use ice on the affected area for the first 24 hours.  Put ice in a Ziploc bag and cover with a towel and place against area 15 minutes on 15 minutes off.  You may switch to heat after 24 hours.Epidural Steroid Injection An epidural steroid injection is a shot of steroid medicine and numbing medicine that is given into the space between the spinal cord and the bones in your back (epidural space). The shot helps relieve pain caused by an irritated or swollen nerve root. The amount of pain relief you get from  the injection depends on what is causing the nerve to be swollen and irritated, and how long your pain lasts. You are more likely to benefit from this injection if your pain is strong and comes on suddenly rather than if you have had pain for a long time. Tell a health care provider about:  Any allergies you have.  All medicines you are taking, including vitamins, herbs, eye drops, creams, and over-the-counter medicines.  Any problems you or family members have had with anesthetic medicines.  Any blood disorders you have.  Any surgeries you have had.  Any medical conditions you have.  Whether you are pregnant or may be pregnant. What are the risks? Generally, this is a safe procedure. However, problems may occur, including:  Headache.  Bleeding.  Infection.  Allergic reaction to medicines.  Damage to your nerves. What happens before the procedure? Staying hydrated  Follow instructions from your health care provider about hydration, which may include:  Up to 2 hours before the procedure - you may continue to drink clear liquids, such as water, clear fruit juice, black coffee, and plain tea. Eating and drinking restrictions  Follow instructions from your health care provider about eating and drinking, which may include:  8 hours before the procedure - stop eating heavy meals or foods such as meat, fried foods, or fatty foods.  6 hours before the procedure - stop eating light meals or foods, such as toast or cereal.  6 hours before the procedure - stop drinking milk or drinks that contain milk.  2 hours before the procedure - stop drinking clear liquids. Medicine  You may be given medicines to lower anxiety.  Ask your health care provider about:  Changing or stopping your regular medicines. This is especially important if you are taking diabetes medicines or blood thinners.  Taking medicines such as aspirin and ibuprofen. These medicines can thin your blood. Do not  take these medicines before your procedure if your health care provider instructs you not  to. General instructions  Plan to have someone take you home from the hospital or clinic. What happens during the procedure?  You may receive a medicine to help you relax (sedative).  You will be asked to lie on your abdomen.  The injection site will be cleaned.  A numbing medicine (local anesthetic) will be used to numb the injection site.  A needle will be inserted through your skin into the epidural space. You may feel some discomfort when this happens. An X-ray machine will be used to make sure the needle is put as close as possible to the affected nerve.  A steroid medicine and a local anesthetic will be injected into the epidural space.  The needle will be removed.  A bandage (dressing) will be put over the injection site. What happens after the procedure?  Your blood pressure, heart rate, breathing rate, and blood oxygen level will be monitored until the medicines you were given have worn off.  Your arm or leg may feel weak or numb for a few hours.  The injection site may feel sore.  Do not drive for 24 hours if you received a sedative. This information is not intended to replace advice given to you by your health care provider. Make sure you discuss any questions you have with your health care provider. Document Released: 01/30/2008 Document Revised: 04/05/2016 Document Reviewed: 02/08/2016 Elsevier Interactive Patient Education  2017 [employer].

## 2016-10-10 ENCOUNTER — Telehealth: Payer: Self-pay | Admitting: *Deleted

## 2016-10-10 NOTE — Telephone Encounter (Signed)
Patient denies problems post procedure.

## 2016-10-11 ENCOUNTER — Ambulatory Visit: Payer: BLUE CROSS/BLUE SHIELD | Admitting: Pain Medicine

## 2016-10-11 ENCOUNTER — Ambulatory Visit: Payer: BLUE CROSS/BLUE SHIELD | Attending: Internal Medicine | Admitting: Physical Therapy

## 2016-10-11 DIAGNOSIS — R262 Difficulty in walking, not elsewhere classified: Secondary | ICD-10-CM | POA: Insufficient documentation

## 2016-10-11 DIAGNOSIS — M545 Low back pain, unspecified: Secondary | ICD-10-CM

## 2016-10-11 DIAGNOSIS — G8929 Other chronic pain: Secondary | ICD-10-CM | POA: Diagnosis present

## 2016-10-11 NOTE — Patient Instructions (Addendum)
Hip flexor stretch over step or stool 10 breaths,  Keep ribcage lowered to relax back muscles not arch the back       Instead of knee out exercise: slide foot forward 30 reps        Body scan practice       WAlking with feet under hips, land more on the ballmounds of the feet instead of heels  Standing with knees slightly unlocked,  Practice pelvic tilt     Gratitiude journal

## 2016-10-11 NOTE — Therapy (Signed)
[place] MAIN [facility] SERVICES [address] [place], [place], [address] Phone: [phone]   Fax:  [phone]  Physical Therapy Treatment / Progress Note  Patient Details  Name: Nicole Walter MRN: [id number] Date of Birth: Mar 29, 1961 No Data Recorded  Encounter Date: 10/11/2016      PT End of Session - 10/11/16 1225    Visit Number 11   Number of Visits 17   Date for PT Re-Evaluation 09/13/16   Authorization Type blue cross blue shield   PT Start Time 1117   PT Stop Time 1220   PT Time Calculation (min) 63 min   Activity Tolerance Patient tolerated treatment well;No increased pain   Behavior During Therapy WFL for tasks assessed/performed      Past Medical History:  Diagnosis Date  . Abnormal liver enzymes 02/08/2016  . Allergy   . Anxiety   . Chest pain 07/05/2016  . Chronic pain   . Depression   . DJD (degenerative joint disease)    lumbar  . Fibromyalgia   . High cholesterol   . Rash of back 01/28/2016    Past Surgical History:  Procedure Laterality Date  . ANKLE SURGERY     x 2  . CHOLECYSTECTOMY    . COLONOSCOPY    . COLONOSCOPY WITH PROPOFOL N/A 02/22/2016   Procedure: COLONOSCOPY WITH PROPOFOL;  Surgeon: Lollie Sails, MD;  Location: [facility] ENDOSCOPY;  Service: Endoscopy;  Laterality: N/A;  . ESOPHAGOGASTRODUODENOSCOPY (EGD) WITH PROPOFOL N/A 02/22/2016   Procedure: ESOPHAGOGASTRODUODENOSCOPY (EGD) WITH PROPOFOL;  Surgeon: Lollie Sails, MD;  Location: [facility] ENDOSCOPY;  Service: Endoscopy;  Laterality: N/A;  . NASAL SINUS SURGERY    . TONSILLECTOMY    . TUBAL LIGATION      There were no vitals filed for this visit.      Subjective Assessment - 10/11/16 1123    Subjective Pt reported after last session with pelvic floor manual Tx, pt reported she experienced cramps and pain went into the L hip joint and R hip and diarrhea, all of which dissipated the next day.  Pt reports her radiating pain comes on and off  and is based on what she does. Pt received shots for her neck and hips. Today, neck and hip pain is at 1/10 on L, and 2.5/10 on R, and hip 5/10 L > R.   Pt continues to perform her pelvic floor exercises and she states it seems like her urgency is getting a little better. Pt was tearful as she expressed feeling depressed, expressing feeling not motivated due to pain. Pt stated she would like to return to the [facility] and join the arthritis class.    Pertinent History lumbar injection 3 months ago, bursa injection 2 months ago, depression, anxiety, fibromyaliga,left ankle surgery 04/2011 and 11/2011   Limitations Sitting;Lifting;Standing;Walking   How long can you sit comfortably? unable to sit comfortably   How long can you stand comfortably? 15 minutes   How long can you walk comfortably? 15 minutes   Diagnostic tests MRI 2014, MRI 04/2016, MRI 07/2016   Patient Stated Goals Patient wants to be able to get stronger to get back to work            [facility] PT Assessment - 10/11/16 1141      Other:   Other/ Comments piriformis and cross over stretch caused pain at inguinal and L lateral hip area     AROM   Overall AROM Comments pt compensates with  lumbar lordosis with limited hip ext (tight hip flexors)      PROM   Overall PROM Comments limited hip ext B      Palpation   SI assessment  L PSIS more posteriorwith hypomobility    Palpation comment significantly decreased scar adhesions     Ambulation/Gait   Gait Comments short stride length, increased lumbar lordosis , increased postural strength and upright posture  heel striking , narrow BOS                   Pelvic Floor Special Questions - 10/11/16 1204    External Perineal Exam L ischiocavernosus, area posterior border of ischial tuberosity (referred to L lateral hip pain)            [facility] Adult PT Treatment/Exercise - 10/11/16 1141      Therapeutic Activites    Therapeutic Activities --  see pt instructions     Neuro  Re-ed    Neuro Re-ed Details  pelvic tilt in supine and standing   gait training w/ anterior resistance at waist      Manual Therapy   Manual therapy comments external manual STM releases along pelvic floor mm, long axis distraction on LE, inferior/ superior glide of sacrum,                 PT Education - 10/11/16 1236    Education provided Yes   Education Details HEP   Person(s) Educated Patient   Methods Explanation;Demonstration;Tactile cues;Verbal cues;Handout   Comprehension Verbalized understanding;Returned demonstration             PT Long Term Goals - 10/11/16 1125      PT LONG TERM GOAL #1   Title Patient will report a worst pain of 3/10 on VAS in back, thoracic spine and neck to improve tolerance with ADLs and reduced symptoms with activities.  (neck 2/10 on R, thoracic/ hip  3-4/10)   Baseline 5/10   Time 8   Period Weeks   Status Achieved     PT LONG TERM GOAL #2   Title Patient will be able to perform household work/ chores without increase in symptoms.   Time 8   Period Weeks   Status On-going     PT LONG TERM GOAL #3   Title Patient will tolerate sitting unsupported demonstrating erect sitting posture with minimal thoracic kyphosis for 15+ minutes with maximum of 3/10 back pain to demonstrate improved back extensor strength and improved sitting tolerance   Baseline 5 minutes   Time 8   Period Weeks   Status Achieved     PT LONG TERM GOAL #4   Title Patient will increase six minute walk test distance to >1000 for progression to community ambulator and improve gait ability   Baseline 700 feet   Time 8   Period Weeks   Status On-going     PT LONG TERM GOAL #5   Title Pt will demo hip extension B ~29 deg in prone without lumbar lordosis in order to increase stride with gait and walk for longer periods of time   Time 12   Period Weeks   Status New               Plan - 10/11/16 1225    Clinical Impression Statement Pt arrived to  appt 15 min late to appt. Pt has achieved 3/5 goals. Pt demo'd improved upright posture, improved gait, and decreased abdominal scar restrictions but showed poor motor  contol of lumbopelvic complex, gait deviations, tensions at the pelvic floor/ spinal mm, and limited hip mobility. Pt continues to show guarded posture with upper trap overuse and increased lumbar lordosis due to upregulated nervous system. Pt required manual Tx to release L pelvic floor mm tensions which referred pain to L lateral hip. Post Tx, pelvic floor mm tensions and referred pain pattern decreased.  Piriformis stretches caused more pain and was withheld. Pt responded better to hip flexor stretches due to limit hip extension. Following Tx, pt showed increased stride length in gait and less lumbar lordosis.Plan to continue to increase hip extension ROM before incorporating pirformis stretch again. Pt continues to require biopsychosocial approaches to manage pain.   Pt requires continued  skilled PT.    Rehab Potential Fair   Clinical Impairments Affecting Rehab Potential chronic pain   PT Frequency Biweekly   PT Duration 12 weeks  16 weeks   PT Treatment/Interventions Aquatic Therapy;Electrical Stimulation;Moist Heat;Therapeutic activities;Gait training;Therapeutic exercise;Manual techniques   Consulted and Agree with Plan of Care Patient      Patient will benefit from skilled therapeutic intervention in order to improve the following deficits and impairments:  Decreased mobility, Difficulty walking, Pain, Decreased strength  Visit Diagnosis: Difficulty in walking, not elsewhere classified  Chronic left-sided low back pain without sciatica     Problem List Patient Active Problem List   Diagnosis Date Noted  . Greater trochanteric bursitis (Right) 09/18/2016  . Subacromial bursitis of shoulder joint (Right) 09/18/2016  . Obesity 09/14/2016  . Elevated sedimentation rate 08/31/2016  . Elevated C-reactive protein (CRP)  08/31/2016  . Long term current use of opiate analgesic 07/20/2016  . Long term prescription opiate use 07/20/2016  . Opiate use 07/20/2016  . Chronic upper back pain (Location of Secondary source of pain) (Bilateral) (L>R) 07/20/2016  . Chronic shoulder pain (Location of Tertiary source of pain) (Bilateral) (L>R) 07/20/2016  . Chronic low back pain (Bilateral) (L>R) 07/20/2016  . Chronic knee pain (Right) 07/20/2016  . Chronic upper extremity pain (Left) 07/20/2016  . Chronic lower extremity pain (Bilateral) (L>R) 07/20/2016  . Chronic abdominal pain 07/20/2016  . Chronic hip pain (Bilateral) (L>R) 05/31/2016  . Chronic Greater trochanteric bursitis (Bilateral) (L>R) 05/31/2016  . Bursitis of right shoulder 05/17/2016  . Skin lesions 04/27/2016  . Hyperlipidemia 02/18/2016  . Cervical osteoarthritis 02/08/2016  . Fibromyalgia 02/08/2016  . Osteopenia 02/08/2016  . Thoracic back pain 01/28/2016  . Chronic neck pain (Location of Primary Source of Pain) (Bilateral) (L>R) 01/28/2016  . Concentration deficit 01/28/2016  . Lumbar spondylosis 01/06/2016  . Anxiety and depression 07/11/2015  . Chronic pain syndrome 07/11/2015  . Combined fat and carbohydrate induced hyperlipemia 07/11/2015    Nicole Walter  ,PT, DPT, E-RYT  10/11/2016, 12:52 PM  [place] MAIN [facility] SERVICES [address] [place], [place], [address] Phone: [phone]   Fax:  [phone]  Name: Shomari Kauk MRN: [id number] Date of Birth: 1961/11/03

## 2016-10-17 ENCOUNTER — Ambulatory Visit (INDEPENDENT_AMBULATORY_CARE_PROVIDER_SITE_OTHER): Payer: BLUE CROSS/BLUE SHIELD | Admitting: Family Medicine

## 2016-10-17 ENCOUNTER — Ambulatory Visit
Admission: RE | Admit: 2016-10-17 | Discharge: 2016-10-17 | Disposition: A | Discharge status: Home / Self Care | Payer: BLUE CROSS/BLUE SHIELD | Source: Ambulatory Visit | Attending: Family Medicine | Admitting: Family Medicine

## 2016-10-17 ENCOUNTER — Encounter: Payer: Self-pay | Admitting: Family Medicine

## 2016-10-17 VITALS — BP 112/70 | HR 63 | Temp 98.2°F | Wt 177.2 lb

## 2016-10-17 DIAGNOSIS — E785 Hyperlipidemia, unspecified: Secondary | ICD-10-CM

## 2016-10-17 DIAGNOSIS — F329 Major depressive disorder, single episode, unspecified: Secondary | ICD-10-CM

## 2016-10-17 DIAGNOSIS — G894 Chronic pain syndrome: Secondary | ICD-10-CM

## 2016-10-17 DIAGNOSIS — F418 Other specified anxiety disorders: Secondary | ICD-10-CM | POA: Diagnosis not present

## 2016-10-17 DIAGNOSIS — Z1231 Encounter for screening mammogram for malignant neoplasm of breast: Secondary | ICD-10-CM | POA: Insufficient documentation

## 2016-10-17 DIAGNOSIS — F32A Depression, unspecified: Secondary | ICD-10-CM

## 2016-10-17 DIAGNOSIS — F419 Anxiety disorder, unspecified: Secondary | ICD-10-CM

## 2016-10-17 DIAGNOSIS — G8929 Other chronic pain: Secondary | ICD-10-CM

## 2016-10-17 DIAGNOSIS — M797 Fibromyalgia: Secondary | ICD-10-CM | POA: Diagnosis not present

## 2016-10-17 DIAGNOSIS — R1013 Epigastric pain: Secondary | ICD-10-CM

## 2016-10-17 NOTE — Patient Instructions (Signed)
Nice to see you. We will obtain lab work today and call you with the results. Please continue to follow-up pain management regarding your pain. Please keep in touch with your gynecologist to see if you need an ultrasound. Please monitor your anxiety and depression and if this does not continue to improve with the trintelix please let your psychiatrist know.

## 2016-10-17 NOTE — Assessment & Plan Note (Signed)
Slightly improved control on her new medication. She'll continue to follow with psychiatry and her therapist. Given return precautions.

## 2016-10-17 NOTE — Assessment & Plan Note (Signed)
Continues to have issues with this. Not able to get her pain under control. Discussed continuing to follow-up with pain management and we can get her into a different rheumatologist to get a second opinion.

## 2016-10-17 NOTE — Progress Notes (Signed)
Tommi Rumps, MD Phone: [phone]  Nicole Walter is a 55 y.o. female who presents today for follow-up.  Chronic pain: Patient notes some continued stiffness in her bilateral trapezius muscles and paraspinous muscles. Had an injection in her left neck though she is not sure if this helped very much. No radiation down her arms or legs. No numbness or weakness. Pain medications aren't helping very much either. She notes a sensation of restlessness but this is not isolated to her legs.  Anxiety/depression: Followed by psychiatry and a therapist. They recently changed her to trintelix. She had been crying a fair amount though has been improved since starting on the new medication. Does feel slightly flat compared to previously. Does still feel anxious and depressed though is somewhat improved. No SI or HI. Taking the hydroxyzine though this does make her sleepy. Sees her therapist on Friday.  HYPERLIPIDEMIA Symptoms Chest pain on exertion:  No   Leg claudication:   No Medications: Compliance- taking simvastatin Right upper quadrant pain- no  Muscle aches- no new muscle aches  Patient has been on AcipHex and Carafate with good benefit for her abdominal discomfort. She notes no recurrence of the epigastric discomfort that she had previously.  She is following with her gynecologist given some right pelvic discomfort that she had previously. He was going to review her most recent CT scan and determine if she needed an ultrasound. Still intermittently has discomfort in this area.   PMH: Former smoker   ROS see history of present illness  Objective  Physical Exam Vitals:   10/17/16 1500  BP: 112/70  Pulse: 63  Temp: 98.2 F (36.8 C)    BP Readings from Last 3 Encounters:  10/17/16 112/70  10/09/16 (!) 110/59  09/20/16 113/65   Wt Readings from Last 3 Encounters:  10/17/16 177 lb 3.2 oz (80.4 kg)  10/09/16 174 lb (78.9 kg)  09/20/16 175 lb (79.4 kg)    Physical Exam    Constitutional: No distress.  Cardiovascular: Normal rate, regular rhythm and normal heart sounds.   Pulmonary/Chest: Effort normal and breath sounds normal.  Musculoskeletal:  No midline spine tenderness, no midline spine step-off, there is bilateral paraspinous muscle and trapezius tenderness and tightness noted  Neurological: She is alert. Gait normal.  5/5 strength in bilateral biceps, triceps, grip, quads, hamstrings, plantar and dorsiflexion, sensation to light touch intact in bilateral UE and LE, normal gait  Skin: Skin is warm and dry. She is not diaphoretic.  Psychiatric:  Mood depressed, affect depressed and intermittently tearful     Assessment/Plan: Please see individual problem list.  Fibromyalgia Continues to have issues with this. She has been on and extensive list of medications. At this point I am unsure exactly what to do for her fibromyalgia pain. Could consider second opinion with another rheumatologist. We'll offer this to the patient to see if she would like to proceed.  Hyperlipidemia Tolerating simvastatin. Check LDL and CMP.  Chronic pain syndrome Continues to have issues with this. Not able to get her pain under control. Discussed continuing to follow-up with pain management and we can get her into a different rheumatologist to get a second opinion.  Anxiety and depression Slightly improved control on her new medication. She'll continue to follow with psychiatry and her therapist. Given return precautions.  Chronic abdominal pain Continues to have issues with this. Now her only discomfort is intermittently in her right pelvis. She is following with gynecology for this. I encouraged her to contact them  if she has not heard anything regarding this in the next week or so. Benign exam today.   Orders Placed This Encounter  Procedures  . Direct LDL  . Comp Met (CMET)  . HgB A1c    Eric Sonnenberg, MD [facility] [place]  

## 2016-10-17 NOTE — Assessment & Plan Note (Signed)
Tolerating simvastatin. Check LDL and CMP.

## 2016-10-17 NOTE — Assessment & Plan Note (Addendum)
Continues to have issues with this. She has been on and extensive list of medications. At this point I am unsure exactly what to do for her fibromyalgia pain. Could consider second opinion with another rheumatologist. We'll offer this to the patient to see if she would like to proceed.

## 2016-10-17 NOTE — Assessment & Plan Note (Signed)
Continues to have issues with this. Now her only discomfort is intermittently in her right pelvis. She is following with gynecology for this. I encouraged her to contact them if she has not heard anything regarding this in the next week or so. Benign exam today.

## 2016-10-18 ENCOUNTER — Encounter: Payer: BLUE CROSS/BLUE SHIELD | Attending: Family Medicine | Admitting: Dietician

## 2016-10-18 ENCOUNTER — Telehealth: Payer: Self-pay | Admitting: Surgical

## 2016-10-18 ENCOUNTER — Encounter: Payer: Self-pay | Admitting: Dietician

## 2016-10-18 ENCOUNTER — Encounter: Payer: Self-pay | Admitting: Family Medicine

## 2016-10-18 VITALS — Ht 64.0 in | Wt 173.6 lb

## 2016-10-18 DIAGNOSIS — Z6830 Body mass index (BMI) 30.0-30.9, adult: Secondary | ICD-10-CM

## 2016-10-18 DIAGNOSIS — E785 Hyperlipidemia, unspecified: Secondary | ICD-10-CM

## 2016-10-18 DIAGNOSIS — E6609 Other obesity due to excess calories: Secondary | ICD-10-CM

## 2016-10-18 DIAGNOSIS — E66811 Obesity, class 1: Secondary | ICD-10-CM

## 2016-10-18 DIAGNOSIS — M797 Fibromyalgia: Secondary | ICD-10-CM

## 2016-10-18 LAB — COMPREHENSIVE METABOLIC PANEL
ALBUMIN: 4.5 g/dL (ref 3.5–5.2)
ALK PHOS: 70 U/L (ref 39–117)
ALT: 26 U/L (ref 0–35)
AST: 16 U/L (ref 0–37)
BUN: 18 mg/dL (ref 6–23)
CALCIUM: 9.2 mg/dL (ref 8.4–10.5)
CO2: 30 mEq/L (ref 19–32)
CREATININE: 0.88 mg/dL (ref 0.40–1.20)
Chloride: 103 mEq/L (ref 96–112)
GFR: 71.03 mL/min (ref >60.00)
Glucose, Bld: 70 mg/dL (ref 70–99)
POTASSIUM: 4 meq/L (ref 3.5–5.1)
SODIUM: 139 meq/L (ref 135–145)
TOTAL PROTEIN: 7 g/dL (ref 6.0–8.3)
Total Bilirubin: 0.2 mg/dL (ref 0.2–1.2)

## 2016-10-18 LAB — LDL CHOLESTEROL, DIRECT: Direct LDL: 97 mg/dL

## 2016-10-18 LAB — HEMOGLOBIN A1C: HEMOGLOBIN A1C: 5.8 % (ref 4.6–6.5)

## 2016-10-18 NOTE — Telephone Encounter (Signed)
Referral placed.

## 2016-10-18 NOTE — Telephone Encounter (Signed)
Spoke with patient and she is fine with referral to rheumatology.

## 2016-10-18 NOTE — Telephone Encounter (Signed)
-----   Message from Leone Haven, MD sent at 10/17/2016  6:37 PM EST ----- Can you see if the patient is ok with a referral to a new rheumatologist to get a second opinion regarding her fibromyalgia? Thanks. Eric.

## 2016-10-18 NOTE — Patient Instructions (Addendum)
   Check food labels (expecially on butter and margarine spreads, fried foods, foods with icing or frosting) for trans fat. Goal is to avoid this fat altogether -- "zero"  Goal for saturated fat is 10-12 grams daily or less. Saturated fat raises "bad" -- LDL cholesterol.   Try healthy choice or other healthy frozen meal for easy and nutritious meals.   Replace coke with water or diet Mark tea. Drink equal amounts of water as other drinks. Goal for daily fluid intake is 64oz average.

## 2016-10-18 NOTE — Progress Notes (Signed)
Medical Nutrition Therapy: Visit start time: 1400  end time: [id number]  Assessment:  Diagnosis: hyperlipidemia, obesity Medical history changes: no changes Psychosocial issues/ stress concerns: depression, anxiety  Current weight: 173.6lbs with shoes  Height: 5'4" Medications, supplement changes: updated list in chart with patient  Progress and evaluation: Patient is dealing with ongoing effort to manage chronic pain, as well as depression. She has tried several different anti-depressant meds in the past few months which have had side effects. She has been on her current medication for about 2 weeks. She was trying to prepare to sell her house, but has now delayed that until next spring.    Physical activity: none at this time due to pain issues  Dietary Intake:  Usual eating pattern includes 2-3 meals and 1-2 snacks per day. Dining out frequency: not assessed today.  Breakfast: sausage and toast today; varies.  Snack: none Lunch: hot dog, sandwich, oodles of noodles, burger Snack: sometimes trail mix (nuts, raisins, mms) Supper: meat, starch, vegetables. States she is likely eating large portions.  Snack: none or chips, trail mix.  Beverages: water, tea, [employer]a  Nutrition Care Education: Topics covered: weight management, hyperlipidemia Basic nutrition: appropriate nutrient balance, general nutrition guidelines    Weight control: meal planning for 1200kcal goal; importance of low fat and low sugar choices, meal options. Used food models to practice meal planning.  Advanced nutrition: food label reading-- gave goals for protein, carbohydrate and fat, including saturated fat. Hyperlipidemia: healthy and unhealthy fats, role of fiber   Nutritional Diagnosis:  [place]-2.2 Altered nutrition-related laboratory As related to hyperlipidemia.  As evidenced by patient report. [place]-3.3 Overweight/obesity As related to inactivity, excess calories.  As evidenced by patient report.  Intervention:  Instruction as noted above.   Discussed practical meal options to minimize necessary cooking time and effort.   Encouraged patient to make gradual and manageable changes.    She wished to return in 2 weeks time; scheduled follow-up accordingly.  Education Materials given:  . Food lists/ Planning A Balanced Meal . Sample meal pattern/ menus . Goals/ instructions  Learner/ who was taught:  . Patient   Level of understanding: Marland Kitchen Verbalizes/ demonstrates competency  Demonstrated degree of understanding via:   Teach back Learning barriers: . None  Willingness to learn/ readiness for change: . Acceptance, ready for change  Monitoring and Evaluation:  Dietary intake, exercise, and body weight      follow up: 11/01/16

## 2016-10-19 ENCOUNTER — Inpatient Hospital Stay
Admission: RE | Admit: 2016-10-19 | Discharge: 2016-10-19 | Disposition: A | Discharge status: Home / Self Care | Payer: Self-pay | Source: Ambulatory Visit | Attending: *Deleted | Admitting: *Deleted

## 2016-10-19 ENCOUNTER — Other Ambulatory Visit: Payer: Self-pay | Admitting: *Deleted

## 2016-10-19 ENCOUNTER — Other Ambulatory Visit (HOSPITAL_COMMUNITY): Payer: Self-pay | Admitting: *Deleted

## 2016-10-19 DIAGNOSIS — Z9289 Personal history of other medical treatment: Secondary | ICD-10-CM

## 2016-10-20 ENCOUNTER — Ambulatory Visit (INDEPENDENT_AMBULATORY_CARE_PROVIDER_SITE_OTHER): Payer: BLUE CROSS/BLUE SHIELD | Admitting: Family Medicine

## 2016-10-20 ENCOUNTER — Encounter: Payer: Self-pay | Admitting: Family Medicine

## 2016-10-20 DIAGNOSIS — F329 Major depressive disorder, single episode, unspecified: Secondary | ICD-10-CM

## 2016-10-20 DIAGNOSIS — F32A Depression, unspecified: Secondary | ICD-10-CM

## 2016-10-20 DIAGNOSIS — M797 Fibromyalgia: Secondary | ICD-10-CM | POA: Diagnosis not present

## 2016-10-20 DIAGNOSIS — F418 Other specified anxiety disorders: Secondary | ICD-10-CM | POA: Diagnosis not present

## 2016-10-20 DIAGNOSIS — F419 Anxiety disorder, unspecified: Secondary | ICD-10-CM

## 2016-10-20 NOTE — Assessment & Plan Note (Signed)
Continues to have issues with this. Saw her therapist today. She'll continue on her current medications. Continue to follow with her therapist. Given return precautions.

## 2016-10-20 NOTE — Progress Notes (Signed)
  Tommi Rumps, MD Phone: [phone]  Nicole Walter is a 55 y.o. female who presents today for follow-up.  Fibromyalgia: Patient notes continuing to have issues with pain all over. Hurts in her legs and upper back as we discussed on Tuesday. Notes it'll ease off with the Atarax at times. Also taking Vicodin. She was confused regarding the rheumatology referral and she thought I felt she did not have fibromyalgia. She has been on essentially every form of fibromyalgia treatment that I know of. I discussed that I suggested a rheumatology referral for a second opinion to see if they can be of any benefit.  Anxiety and depression. Patient notes this has been worse. She saw her therapist today and they went over some relaxation techniques with her. She follows up with them in 1 week. She is taking Atarax and Trintellix. No SI or HI.  PMH: Former smoker   ROS see history of present illness  Objective  Physical Exam Vitals:   10/20/16 1334  BP: 110/70  Pulse: 70    BP Readings from Last 3 Encounters:  10/20/16 110/70  10/17/16 112/70  10/09/16 (!) 110/59   Wt Readings from Last 3 Encounters:  10/20/16 174 lb (78.9 kg)  10/18/16 173 lb 9.6 oz (78.7 kg)  10/17/16 177 lb 3.2 oz (80.4 kg)    Physical Exam  Constitutional: No distress.  Cardiovascular: Normal rate, regular rhythm and normal heart sounds.   Pulmonary/Chest: Effort normal and breath sounds normal.  Musculoskeletal:  No midline spine tenderness, no midline spine step-off, trapezius tenderness bilaterally, no other muscular back tenderness  Neurological: She is alert.  5/5 strength in bilateral biceps, triceps, grip, quads, hamstrings, plantar and dorsiflexion, sensation to light touch intact in bilateral UE and LE, normal gait  Skin: Skin is warm and dry. She is not diaphoretic.  Psychiatric:  Mood depressed, affect flat intermittently tearful     Assessment/Plan: Please see individual problem  list.  Fibromyalgia Continues to have issues with this. I discussed at length the reasoning behind a referral to rheumatology for second opinion. Advised that I'm unsure what I can do for her pain at this time and I wanted to see if they could be helpful in this area. She'll continue her current regimen. Referral has already been placed.  Anxiety and depression Continues to have issues with this. Saw her therapist today. She'll continue on her current medications. Continue to follow with her therapist. Given return precautions.  Tommi Rumps, MD [place]

## 2016-10-20 NOTE — Patient Instructions (Signed)
Nice to see you. We are going to get you to see a rheumatologist for a second opinion. Please continue to see your therapist and continue your anxiety and depression medications. If you develop thoughts of harming yourself or others please go to the emergency room immediately.

## 2016-10-20 NOTE — Assessment & Plan Note (Signed)
Continues to have issues with this. I discussed at length the reasoning behind a referral to rheumatology for second opinion. Advised that I'm unsure what I can do for her pain at this time and I wanted to see if they could be helpful in this area. She'll continue her current regimen. Referral has already been placed.

## 2016-10-20 NOTE — Progress Notes (Signed)
Mailed letter to patient

## 2016-10-23 ENCOUNTER — Telehealth: Payer: Self-pay | Admitting: Family Medicine

## 2016-10-23 NOTE — Telephone Encounter (Signed)
If the patient is comfortable with her current rheumatologist she may return to them. I suggested a second opinion to see if there was any other thoughts from anyone else.

## 2016-10-23 NOTE — Telephone Encounter (Signed)
Please advise 

## 2016-10-23 NOTE — Telephone Encounter (Signed)
Pt called back and wanted to clarify what Dr. Caryl Bis wanted to do as far as her Fibromyalgia. She feels that there was just a miscommunication with her current Rheumatologist. Do you want her to continue with who she is seeing or do you want to use someone else. She is very comfortable with who you are now. Please advise, thank you!  Call pt @ [phone]

## 2016-10-24 NOTE — Telephone Encounter (Signed)
Patient states she would rather stay with Dr.Bock, she just doesn't think that Dr.Bock is aware that nobody is currently treating her fibromyalgia and she would like for her to start managing this. Patient states she has stopped the gabapentin and needs to be put on something different.

## 2016-10-25 NOTE — Telephone Encounter (Signed)
This is fine with me. Discussed with the patient previously that I'm not sure what else to put her on given everything that she has already tried. She should follow up with her rheumatologist to discuss this further.

## 2016-10-26 NOTE — Telephone Encounter (Signed)
Patient notified

## 2016-11-01 ENCOUNTER — Ambulatory Visit: Payer: BLUE CROSS/BLUE SHIELD | Admitting: Dietician

## 2016-11-01 ENCOUNTER — Ambulatory Visit: Payer: BLUE CROSS/BLUE SHIELD | Admitting: Physical Therapy

## 2016-11-07 ENCOUNTER — Telehealth: Payer: Self-pay

## 2016-11-07 NOTE — Telephone Encounter (Signed)
Pt wants to know who she should follow up with concerning her fibromyalgia. She wants to know will Dr. Lowella Dandy prescribe her pain medications for this or she she see if her rheumatologist wll prescribe her pain meds for this

## 2016-11-07 NOTE — Telephone Encounter (Signed)
Instructed patient to come to follow up appointment to discuss medication changes.  Patient would like to know  Whether or not to make appointment with  Rheumatology.  Patient states pain medicine is not helping, gabapentin and Lyrica did not help.  Is having pain in her hips and needs something to help it.  States she was told not to get any shots from anywhere else, but needs something done.

## 2016-11-08 ENCOUNTER — Other Ambulatory Visit: Payer: Self-pay | Admitting: Pain Medicine

## 2016-11-08 ENCOUNTER — Ambulatory Visit: Payer: BLUE CROSS/BLUE SHIELD | Attending: Internal Medicine | Admitting: Physical Therapy

## 2016-11-08 DIAGNOSIS — G8929 Other chronic pain: Secondary | ICD-10-CM | POA: Insufficient documentation

## 2016-11-08 DIAGNOSIS — R262 Difficulty in walking, not elsewhere classified: Secondary | ICD-10-CM | POA: Diagnosis not present

## 2016-11-08 DIAGNOSIS — M25559 Pain in unspecified hip: Principal | ICD-10-CM

## 2016-11-08 DIAGNOSIS — M545 Low back pain, unspecified: Secondary | ICD-10-CM

## 2016-11-08 NOTE — Patient Instructions (Addendum)
Snow angels  10 reps   Locust pose  Pillow under hips,   Finger tips shooting straight down Palms face midline by hips  Imagine holding pencil under your armpits Draw shoulders away from ears Inhale Exhale lift chest up slightly without feeling it in your back. The bend happens in the midback  (keep chin tucked)   5 reps

## 2016-11-08 NOTE — Therapy (Signed)
[place] MAIN [facility] SERVICES [address] [place], [place], [address] Phone: [phone]   Fax:  [phone]  Physical Therapy Treatment  Patient Details  Name: Nicole Walter MRN: [id number] Date of Birth: [birth year] No Data Recorded  Encounter Date: 11/08/2016      PT End of Session - 11/08/16 0919    Visit Number 12   Number of Visits 17   Date for PT Re-Evaluation 12/24/16   Authorization Type blue cross blue shield   PT Start Time 0910   PT Stop Time 1015   PT Time Calculation (min) 65 min   Activity Tolerance Patient tolerated treatment well;No increased pain   Behavior During Therapy WFL for tasks assessed/performed      Past Medical History:  Diagnosis Date  . Abnormal liver enzymes 02/08/2016  . Allergy   . Anxiety   . Chest pain 07/05/2016  . Chronic pain   . Depression   . DJD (degenerative joint disease)    lumbar  . Fibromyalgia   . High cholesterol   . Rash of back 01/28/2016    Past Surgical History:  Procedure Laterality Date  . ANKLE SURGERY     x 2  . BREAST BIOPSY Left 1997  . CHOLECYSTECTOMY    . COLONOSCOPY    . COLONOSCOPY WITH PROPOFOL N/A 02/22/2016   Procedure: COLONOSCOPY WITH PROPOFOL;  Surgeon: Lollie Sails, MD;  Location: [facility] ENDOSCOPY;  Service: Endoscopy;  Laterality: N/A;  . ESOPHAGOGASTRODUODENOSCOPY (EGD) WITH PROPOFOL N/A 02/22/2016   Procedure: ESOPHAGOGASTRODUODENOSCOPY (EGD) WITH PROPOFOL;  Surgeon: Lollie Sails, MD;  Location: [facility] ENDOSCOPY;  Service: Endoscopy;  Laterality: N/A;  . NASAL SINUS SURGERY    . TONSILLECTOMY    . TUBAL LIGATION      There were no vitals filed for this visit.      Subjective Assessment - 11/08/16 0919    Subjective Pt reported she is waiting to get her approval for a hip injection. Pt has received shots for her neck and they have helped certained neck muscles. Pt has been practicing relaxing her back as recommended by PT.     Pertinent  History lumbar injection 3 months ago, bursa injection 2 months ago, depression, anxiety, fibromyaliga,left ankle surgery 04/2011 and 11/2011   Limitations Sitting;Lifting;Standing;Walking   How long can you sit comfortably? unable to sit comfortably   How long can you stand comfortably? 15 minutes   How long can you walk comfortably? 15 minutes   Diagnostic tests MRI 2014, MRI 04/2016, MRI 07/2016   Patient Stated Goals Patient wants to be able to get stronger to get back to work            [facility] PT Assessment - 11/08/16 0953      Observation/Other Assessments   Observations decreased mm tensions, especially in upper traps, decreased lumbar lordosis      Palpation   Spinal mobility L laterally shifted T10, increased L rhomoboids    SI assessment  PSIS more symmetrical      Ambulation/Gait   Gait Comments longer stride                       [facility] Adult PT Treatment/Exercise - 11/08/16 0953      Neuro Re-ed    Neuro Re-ed Details  see pt instructions     Moist Heat Therapy   Number Minutes Moist Heat 8 Minutes   Moist Heat Location Other (comment)  upper  back     Manual Therapy   Manual therapy comments STM on rhomboids , grade III mob to faciliate a entrally aligned T10 segment.                 PT Education - 11/08/16 1014    Education provided Yes   Person(s) Educated Patient   Methods Explanation;Demonstration;Tactile cues;Verbal cues;Handout   Comprehension Returned demonstration;Verbalized understanding             PT Long Term Goals - 11/08/16 0947      PT LONG TERM GOAL #1   Title Patient will report a worst pain of 3/10 on VAS in back, thoracic spine and neck to improve tolerance with ADLs and reduced symptoms with activities.  (neck 2/10 on R, thoracic/ hip  3-4/10)   Baseline 5/10   Time 8   Period Weeks   Status Achieved     PT LONG TERM GOAL #2   Title Patient will be able to perform household work/ chores without increase in  symptoms.   Time 8   Period Weeks   Status Achieved     PT LONG TERM GOAL #3   Title Patient will tolerate sitting unsupported demonstrating erect sitting posture with minimal thoracic kyphosis for 15+ minutes with maximum of 3/10 back pain to demonstrate improved back extensor strength and improved sitting tolerance   Baseline 5 minutes   Time 8   Period Weeks   Status Achieved     PT LONG TERM GOAL #4   Title Patient will increase six minute walk test distance to >1000 for progression to community ambulator and improve gait ability   Baseline 700 feet   Time 8   Period Weeks   Status On-going     PT LONG TERM GOAL #5   Title Pt will demo hip extension B ~20 deg in prone without lumbar lordosis in order to increase stride with gait and walk for longer periods of time   Time 12   Period Weeks   Status Achieved     Additional Long Term Goals   Additional Long Term Goals Yes     PT LONG TERM GOAL #6   Title Pt will demo proper alignment and modifications with yoga and fitness routines  in order to maintain health and wellness at the [facility]   Time 12   Period Weeks   Status New               Plan - 11/08/16 1015    Clinical Impression Statement Pt has achieved 4/6 goals. Today, pt reports a decrease in her shoulder pain from 4/10 to 2/10 after Tx. Pt demo'd improved scapular control without overuse of upper trap following Tx. Pt also showed a more centrally aligned T10  segment and decreased rhomoboid mm tensions on L.  Pt continues to benefit from skilled PT to address scapular control to decrease neck pain and to help pt return to fitness routine for management of pain.    Rehab Potential Fair   Clinical Impairments Affecting Rehab Potential chronic pain   PT Frequency Biweekly   PT Duration 12 weeks  16 weeks   PT Treatment/Interventions Aquatic Therapy;Electrical Stimulation;Moist Heat;Therapeutic activities;Gait training;Therapeutic exercise;Manual techniques    Consulted and Agree with Plan of Care Patient      Patient will benefit from skilled therapeutic intervention in order to improve the following deficits and impairments:  Decreased mobility, Difficulty walking, Pain, Decreased strength  Visit Diagnosis: Difficulty in  walking, not elsewhere classified  Chronic left-sided low back pain without sciatica  Left-sided low back pain without sciatica, unspecified chronicity     Problem List Patient Active Problem List   Diagnosis Date Noted  . Greater trochanteric bursitis (Right) 09/18/2016  . Subacromial bursitis of shoulder joint (Right) 09/18/2016  . Obesity 09/14/2016  . Elevated sedimentation rate 08/31/2016  . Elevated C-reactive protein (CRP) 08/31/2016  . Long term current use of opiate analgesic 07/20/2016  . Long term prescription opiate use 07/20/2016  . Opiate use 07/20/2016  . Chronic upper back pain (Location of Secondary source of pain) (Bilateral) (L>R) 07/20/2016  . Chronic shoulder pain (Location of Tertiary source of pain) (Bilateral) (L>R) 07/20/2016  . Chronic low back pain (Bilateral) (L>R) 07/20/2016  . Chronic knee pain (Right) 07/20/2016  . Chronic upper extremity pain (Left) 07/20/2016  . Chronic lower extremity pain (Bilateral) (L>R) 07/20/2016  . Chronic abdominal pain 07/20/2016  . Chronic hip pain (Bilateral) (L>R) 05/31/2016  . Chronic Greater trochanteric bursitis (Bilateral) (L>R) 05/31/2016  . Bursitis of right shoulder 05/17/2016  . Skin lesions 04/27/2016  . Hyperlipidemia 02/18/2016  . Cervical osteoarthritis 02/08/2016  . Fibromyalgia 02/08/2016  . Osteopenia 02/08/2016  . Thoracic back pain 01/28/2016  . Chronic neck pain (Location of Primary Source of Pain) (Bilateral) (L>R) 01/28/2016  . Concentration deficit 01/28/2016  . Lumbar spondylosis 01/06/2016  . Anxiety and depression 07/11/2015  . Chronic pain syndrome 07/11/2015  . Combined fat and carbohydrate induced hyperlipemia  07/11/2015    Jerl Mina ,PT, DPT, E-RYT  11/08/2016, 1:41 PM  [place] MAIN [facility] SERVICES [address] [place], [place], [address] Phone: [phone]   Fax:  [phone]  Name: Tahiya Carusone MRN: [id number] Date of Birth: Oct 04, 1961

## 2016-11-08 NOTE — Telephone Encounter (Signed)
Spoke with Dr Dossie Arbour.  He ordered bilateral hip injections.  Spoke with patient and she requests sedation.  Pre procedure instructions given and teach back 3 done.  Will send this to Angie for approval, and patient will be notified of appointment.

## 2016-11-14 ENCOUNTER — Ambulatory Visit: Payer: BLUE CROSS/BLUE SHIELD | Admitting: Dietician

## 2016-11-14 NOTE — Telephone Encounter (Signed)
No prior authorization required for [employer]  OKAY to schedule - Routed to [facility]

## 2016-11-20 ENCOUNTER — Encounter: Payer: Self-pay | Admitting: Pain Medicine

## 2016-11-20 ENCOUNTER — Ambulatory Visit
Admission: RE | Admit: 2016-11-20 | Discharge: 2016-11-20 | Disposition: A | Discharge status: Home / Self Care | Payer: BLUE CROSS/BLUE SHIELD | Source: Ambulatory Visit | Attending: Pain Medicine | Admitting: Pain Medicine

## 2016-11-20 ENCOUNTER — Ambulatory Visit (HOSPITAL_BASED_OUTPATIENT_CLINIC_OR_DEPARTMENT_OTHER): Payer: BLUE CROSS/BLUE SHIELD | Admitting: Pain Medicine

## 2016-11-20 VITALS — BP 121/75 | HR 60 | Temp 97.8°F | Resp 16 | Ht 64.0 in | Wt 174.0 lb

## 2016-11-20 DIAGNOSIS — Z9049 Acquired absence of other specified parts of digestive tract: Secondary | ICD-10-CM | POA: Diagnosis not present

## 2016-11-20 DIAGNOSIS — Z9889 Other specified postprocedural states: Secondary | ICD-10-CM | POA: Insufficient documentation

## 2016-11-20 DIAGNOSIS — M160 Bilateral primary osteoarthritis of hip: Secondary | ICD-10-CM

## 2016-11-20 DIAGNOSIS — Z888 Allergy status to other drugs, medicaments and biological substances status: Secondary | ICD-10-CM | POA: Insufficient documentation

## 2016-11-20 MED ORDER — IOPAMIDOL (ISOVUE-M 200) INJECTION 41%
10.0000 mL | Freq: Once | INTRAMUSCULAR | Status: AC
  Administered 2016-11-20: 10 mL via INTRA_ARTICULAR
  Filled 2016-11-20: qty 10

## 2016-11-20 MED ORDER — FENTANYL CITRATE (PF) 100 MCG/2ML IJ SOLN
25.0000 ug | INTRAMUSCULAR | Status: DC | PRN
  Administered 2016-11-20: 50 ug via INTRAVENOUS
  Filled 2016-11-20: qty 2

## 2016-11-20 MED ORDER — MIDAZOLAM HCL 5 MG/5ML IJ SOLN
1.0000 mg | INTRAMUSCULAR | Status: DC | PRN
  Administered 2016-11-20: 2 mg via INTRAVENOUS
  Filled 2016-11-20: qty 5

## 2016-11-20 MED ORDER — METHYLPREDNISOLONE ACETATE 80 MG/ML IJ SUSP
80.0000 mg | Freq: Once | INTRAMUSCULAR | Status: AC
  Administered 2016-11-20: 80 mg
  Filled 2016-11-20: qty 1

## 2016-11-20 MED ORDER — ROPIVACAINE HCL 2 MG/ML IJ SOLN
4.0000 mL | Freq: Once | INTRAMUSCULAR | Status: AC
  Administered 2016-11-20: 4 mL via INTRA_ARTICULAR
  Filled 2016-11-20: qty 10

## 2016-11-20 MED ORDER — LACTATED RINGERS IV SOLN
1000.0000 mL | Freq: Once | INTRAVENOUS | Status: AC
  Administered 2016-11-20: 1000 mL via INTRAVENOUS

## 2016-11-20 MED ORDER — LIDOCAINE HCL (PF) 1 % IJ SOLN
10.0000 mL | Freq: Once | INTRAMUSCULAR | Status: AC
  Administered 2016-11-20: 10 mL
  Filled 2016-11-20: qty 10

## 2016-11-20 MED ORDER — METHYLPREDNISOLONE ACETATE 80 MG/ML IJ SUSP
80.0000 mg | Freq: Once | INTRAMUSCULAR | Status: AC
  Administered 2016-11-20: 80 mg via INTRA_ARTICULAR
  Filled 2016-11-20: qty 1

## 2016-11-20 NOTE — Progress Notes (Signed)
Patient's Name: Nicole Walter  MRN: [id number]  Referring Provider: Leone Haven, MD  DOB: [birth year]  PCP: Leone Haven, MD  DOS: 11/20/2016  Note by: Kathlen Brunswick. Dossie Arbour, MD  Service setting: Ambulatory outpatient  Location: [facility] (AMB) Pain Management Facility  Visit type: Procedure  Specialty: Interventional Pain Management  Patient type: Established   Primary Reason for Visit: Interventional Pain Management Treatment. CC: Hip Pain (bilateral)  Procedure:  Anesthesia, Analgesia, Anxiolysis:  Type: Diagnostic Intra-Articular Hip Injection Region:  Posterolateral hip joint area. Level: Lower pelvic and hip joint level. Laterality: Bilateral  Type: Local Anesthesia with Moderate (Conscious) Sedation Local Anesthetic: Lidocaine 1% Route: Intravenous (IV) IV Access: Secured Sedation: Meaningful verbal contact was maintained at all times during the procedure  Indication(s): Analgesia and Anxiety  Indications: 1. Osteoarthritis of hip (Bilateral)    Pain Score: Pre-procedure: 5 /10 Post-procedure: 0-No pain/10  Pre-op Assessment:  Previous date of service: 10/09/16 Service provided: Procedure Nicole Walter is a 56 y.o. (year old), female patient, seen today for interventional treatment. She  has a past surgical history that includes Cholecystectomy; Tubal ligation; Ankle surgery; Nasal sinus surgery; Tonsillectomy; Colonoscopy; Esophagogastroduodenoscopy (egd) with propofol (N/A, 02/22/2016); Colonoscopy with propofol (N/A, 02/22/2016); and Breast biopsy (Left, 1997). Her primarily concern today is the Hip Pain (bilateral)  Walter pressure 121/75, pulse 60, temperature 97.8 F (36.6 C), temperature source Temporal, resp. rate 16, height 5\' 4"  (1.626 m), weight 174 lb (78.9 kg), SpO2 98 %.Calculated BMI: Body mass index is 29.87 kg/m. Risk Assessment: Allergies: Reviewed. She is allergic to food and other. Allergy Precautions: None required Coagulopathies: "Reviewed. None  identified. Walter-thinner therapy: None at this time Active Infection(s): Reviewed. None identified. Nicole Walter is afebrile Site Confirmation: Nicole Walter was asked to confirm the procedure and laterality before marking the site Procedure checklist: Completed Consent: Before the procedure and under the influence of no sedative(s), amnesic(s), or anxiolytics, the patient was informed of the treatment options, risks and possible complications. To fulfill our ethical and legal obligations, as recommended by the American Medical Association's Code of Ethics, I have informed the patient of my clinical impression; the nature and purpose of the treatment or procedure; the risks, benefits, and possible complications of the intervention; the alternatives, including doing nothing; the risk(s) and benefit(s) of the alternative treatment(s) or procedure(s); and the risk(s) and benefit(s) of doing nothing. The patient was provided information about the general risks and possible complications associated with the procedure. These may include, but are not limited to: failure to achieve desired goals, infection, bleeding, organ or nerve damage, allergic reactions, paralysis, and death. In addition, the patient was informed of those risks and complications associated to the procedure, such as failure to decrease pain; infection; bleeding; organ or nerve damage with subsequent damage to sensory, motor, and/or autonomic systems, resulting in permanent pain, numbness, and/or weakness of one or several areas of the body; allergic reactions; (i.e.: anaphylactic reaction); and/or death. Furthermore, the patient was informed of those risks and complications associated with the medications. These include, but are not limited to: allergic reactions (i.e.: anaphylactic or anaphylactoid reaction(s)); adrenal axis suppression; Walter sugar elevation that in diabetics may result in ketoacidosis or comma; water retention that in patients with  history of congestive heart failure may result in shortness of breath, pulmonary edema, and decompensation with resultant heart failure; weight gain; swelling or edema; medication-induced neural toxicity; particulate matter embolism and Walter vessel occlusion with resultant organ, and/or nervous system infarction; and/or aseptic necrosis of  one or more joints. Finally, the patient was informed that Medicine is not an exact science; therefore, there is also the possibility of unforeseen or unpredictable risks and/or possible complications that may result in a catastrophic outcome. The patient indicated having understood very clearly. We have given the patient no guarantees and we have made no promises. Enough time was given to the patient to ask questions, all of which were answered to the patient's satisfaction. Nicole Walter has indicated that she wanted to continue with the procedure. Attestation: I, the ordering provider, attest that I have discussed with the patient the benefits, risks, side-effects, alternatives, likelihood of achieving goals, and potential problems during recovery for the procedure that I have provided informed consent. Date: 11/20/2016; Time: 11:38 AM  Pre-Procedure Preparation:  Monitoring: As per clinic protocol. Respiration, ETCO2, SpO2, BP, heart rate and rhythm monitor placed and checked for adequate function Safety Precautions: Patient was assessed for positional comfort and pressure points before starting the procedure. Time-out: I initiated and conducted the "Time-out" before starting the procedure, as per protocol. The patient was asked to participate by confirming the accuracy of the "Time Out" information. Verification of the correct person, site, and procedure were performed and confirmed by me, the nursing staff, and the patient. "Time-out" conducted as per Joint Commission's Universal Protocol (UP.01.01.01). Date: 11/20/2016; Time: 1217 hrs.  Description of Procedure  Process:   Position: Lateral Decubitus with bad side up Target Area: Superior aspect of the hip joint cavity, going thru the superior portion of the capsular ligament. Approach: Posterolateral approach. Area Prepped: Entire Posterolateral hip area. Prepping solution: ChloraPrep (2% chlorhexidine gluconate and 70% isopropyl alcohol) Safety Precautions: Aspiration looking for Walter return was conducted prior to all injections. At no point did we inject any substances, as a needle was being advanced. No attempts were made at seeking any paresthesias. Safe injection practices and needle disposal techniques used. Medications properly checked for expiration dates. SDV (single dose vial) medications used. Description of the Procedure: Protocol guidelines were followed. The patient was placed in position over the fluoroscopy table. The target area was identified and the area prepped in the usual manner. Skin & deeper tissues infiltrated with local anesthetic. Appropriate amount of time allowed to pass for local anesthetics to take effect. The procedure needles were then advanced to the target area. Proper needle placement secured. Negative aspiration confirmed. Solution injected in intermittent fashion, asking for systemic symptoms every 0.5cc of injectate. The needles were then removed and the area cleansed, making sure to leave some of the prepping solution back to take advantage of its long term bactericidal properties. Materials:  Needle(s) Type: Epidural needle Gauge: 22G Length: 7-in Medication(s): We administered fentaNYL, lactated ringers, midazolam, methylPREDNISolone acetate, iopamidol, lidocaine (PF), ropivacaine (PF) 2 mg/mL (0.2%), methylPREDNISolone acetate, and ropivacaine (PF) 2 mg/mL (0.2%). Please see chart orders for dosing details.  Imaging Guidance (Non-Spinal):  Type of Imaging Technique: Fluoroscopy Guidance (Non-Spinal) Indication(s): Assistance in needle guidance and placement for  procedures requiring needle placement in or near specific anatomical locations not easily accessible without such assistance. Exposure Time: Please see nurses notes. Contrast: Before injecting any contrast, we confirmed that the patient did not have an allergy to iodine, shellfish, or radiological contrast. Once satisfactory needle placement was completed at the desired level, radiological contrast was injected. Contrast injected under live fluoroscopy. No contrast complications. See chart for type and volume of contrast used. Fluoroscopic Guidance: I was personally present during the use of fluoroscopy. "Tunnel Vision Technique" used  to obtain the best possible view of the target area. Parallax error corrected before commencing the procedure. "Direction-depth-direction" technique used to introduce the needle under continuous pulsed fluoroscopy. Once target was reached, antero-posterior, oblique, and lateral fluoroscopic projection used confirm needle placement in all planes. Images permanently stored in EMR. Interpretation: I personally interpreted the imaging intraoperatively. Adequate needle placement confirmed in multiple planes. Appropriate spread of contrast into desired area was observed. No evidence of afferent or efferent intravascular uptake. Permanent images saved into the patient's record.  Antibiotic Prophylaxis:  Indication(s): None identified Antibiotic given: None Post-operative Assessment:  EBL: None Complications: No immediate post-treatment complications observed by team, or reported by patient. Note: The patient tolerated the entire procedure well. A repeat set of vitals were taken after the procedure and the patient was kept under observation following institutional policy, for this type of procedure. Post-procedural neurological assessment was performed, showing return to baseline, prior to discharge. The patient was provided with post-procedure discharge instructions, including a  section on how to identify potential problems. Should any problems arise concerning this procedure, the patient was given instructions to immediately contact us, at any time, without hesitation. In any case, we plan to contact the patient by telephone for a follow-up status report regarding this interventional procedure. Comments:  No additional relevant information.  Plan of Care  Disposition: Discharge home Discharge Date: 11/20/2016; Discharge Time: 1310 hrs Physician-requested Follow-up:  Return in about 2 weeks (around 12/04/2016) for Post-Procedure evaluation.  Future Appointments Date Time Provider [place]  11/23/2016 10:30 AM Shin-Yiing Aurea Graff, PT [facility] None  12/07/2016 10:30 AM Shin-Yiing Aurea Graff, PT [facility] None  12/14/2016 10:30 AM Shin-Yiing Aurea Graff, PT [facility] None  12/20/2016 8:00 AM Shin-Yiing Aurea Graff, PT [facility] None  12/21/2016 8:00 AM Milinda Pointer, MD [facility] None  01/15/2017 10:00 AM Leone Haven, MD [facility] None   Medications ordered for procedure: Meds ordered this encounter  Medications  . fentaNYL (SUBLIMAZE) injection 25-50 mcg    Make sure Narcan is available in the pyxis when using this medication. In the event of respiratory depression (RR< 8/min): Titrate NARCAN (naloxone) in increments of 0.1 to 0.2 mg IV at 2-3 minute intervals, until desired degree of reversal.  . lactated ringers infusion 1,000 mL  . midazolam (VERSED) 5 MG/5ML injection 1-2 mg    Make sure Flumazenil is available in the pyxis when using this medication. If oversedation occurs, administer 0.2 mg IV over 15 sec. If after 45 sec no response, administer 0.2 mg again over 1 min; may repeat at 1 min intervals; not to exceed 4 doses (1 mg)  . methylPREDNISolone acetate (DEPO-MEDROL) injection 80 mg  . iopamidol (ISOVUE-M) 41 % intrathecal injection 10 mL  . lidocaine (PF) (XYLOCAINE) 1 % injection 10 mL  . ropivacaine (PF) 2 mg/mL (0.2%) (NAROPIN) injection 4 mL  .  methylPREDNISolone acetate (DEPO-MEDROL) injection 80 mg  . ropivacaine (PF) 2 mg/mL (0.2%) (NAROPIN) injection 4 mL   Medications administered: We administered fentaNYL, lactated ringers, midazolam, methylPREDNISolone acetate, iopamidol, lidocaine (PF), ropivacaine (PF) 2 mg/mL (0.2%), methylPREDNISolone acetate, and ropivacaine (PF) 2 mg/mL (0.2%).  See the medical record for exact dosing, route, and time of administration.  Lab-work, Procedure(s), & Referral(s) Ordered: Orders Placed This Encounter  Procedures  . HIP INJECTION  . DG C-Arm 1-60 Min-No Report  . Discharge instructions  . Follow-up  . Informed Consent Details: Transcribe to consent form and obtain patient signature  . Provider attestation of informed consent for procedure/surgical case  .  Verify informed consent   Imaging Ordered: Results for orders placed in visit on 10/09/16  DG C-Arm 1-60 Min-No Report   Narrative There is no report for this exam.   New Prescriptions   No medications on file    Primary Care Physician: Leone Haven, MD Location: [facility] Note by: Kathlen Brunswick. Dossie Arbour, M.D, DABA, DABAPM, DABPM, DABIPP, FIPP Date: 11/20/2016; Time: 1:49 PM  Disclaimer:  Medicine is not an Chief Strategy Officer. The only guarantee in medicine is that nothing is guaranteed. It is important to note that the decision to proceed with this intervention was based on the information collected from the patient. The Data and conclusions were drawn from the patient's questionnaire, the interview, and the physical examination. Because the information was provided in large part by the patient, it cannot be guaranteed that it has not been purposely or unconsciously manipulated. Every effort has been made to obtain as much relevant data as possible for this evaluation. It is important to note that the conclusions that lead to this procedure are derived in large part from the available data. Always take  into account that the treatment will also be dependent on availability of resources and existing treatment guidelines, considered by other Pain Management Practitioners as being common knowledge and practice, at the time of the intervention. For Medico-Legal purposes, it is also important to point out that variation in procedural techniques and pharmacological choices are the acceptable norm. The indications, contraindications, technique, and results of the above procedure should only be interpreted and judged by a Board-Certified Interventional Pain Specialist with extensive familiarity and expertise in the same exact procedure and technique. Attempts at providing opinions without similar or greater experience and expertise than that of the treating physician will be considered as inappropriate and unethical, and shall result in a formal complaint to the state medical board and applicable specialty societies.  Instructions provided at this appointment: Patient Instructions  Pain Management Discharge Instructions  General Discharge Instructions :  If you need to reach your doctor call: Monday-Friday 8:00 am - 4:00 pm at [phone] or toll free [phone].  After clinic hours [phone] to have operator reach doctor.  Bring all of your medication bottles to all your appointments in the pain clinic.  To cancel or reschedule your appointment with Pain Management please remember to call 24 hours in advance to avoid a fee.  Refer to the educational materials which you have been given on: General Risks, I had my Procedure. Discharge Instructions, Post Sedation.  Post Procedure Instructions:  The drugs you were given will stay in your system until tomorrow, so for the next 24 hours you should not drive, make any legal decisions or drink any alcoholic beverages.  You may eat anything you prefer, but it is better to start with liquids then soups and crackers, and gradually work up to solid  foods.  Please notify your doctor immediately if you have any unusual bleeding, trouble breathing or pain that is not related to your normal pain.  Depending on the type of procedure that was done, some parts of your body may feel week and/or numb.  This usually clears up by tonight or the next day.  Walk with the use of an assistive device or accompanied by an adult for the 24 hours.  You may use ice on the affected area for the first 24 hours.  Put ice in a Ziploc bag and cover with a towel and place against area  15 minutes on 15 minutes off.  You may switch to heat after 24 hours.  IMPORTANT: Please fill out the post procedure pain diary and bring it with you at your next appointment.

## 2016-11-20 NOTE — Patient Instructions (Signed)
Pain Management Discharge Instructions  General Discharge Instructions :  If you need to reach your doctor call: Monday-Friday 8:00 am - 4:00 pm at [phone] or toll free [phone].  After clinic hours [phone] to have operator reach doctor.  Bring all of your medication bottles to all your appointments in the pain clinic.  To cancel or reschedule your appointment with Pain Management please remember to call 24 hours in advance to avoid a fee.  Refer to the educational materials which you have been given on: General Risks, I had my Procedure. Discharge Instructions, Post Sedation.  Post Procedure Instructions:  The drugs you were given will stay in your system until tomorrow, so for the next 24 hours you should not drive, make any legal decisions or drink any alcoholic beverages.  You may eat anything you prefer, but it is better to start with liquids then soups and crackers, and gradually work up to solid foods.  Please notify your doctor immediately if you have any unusual bleeding, trouble breathing or pain that is not related to your normal pain.  Depending on the type of procedure that was done, some parts of your body may feel week and/or numb.  This usually clears up by tonight or the next day.  Walk with the use of an assistive device or accompanied by an adult for the 24 hours.  You may use ice on the affected area for the first 24 hours.  Put ice in a Ziploc bag and cover with a towel and place against area 15 minutes on 15 minutes off.  You may switch to heat after 24 hours.  IMPORTANT: Please fill out the post procedure pain diary and bring it with you at your next appointment.

## 2016-11-21 NOTE — Telephone Encounter (Signed)
No answer- Left message to call if needed 

## 2016-11-23 ENCOUNTER — Ambulatory Visit: Payer: BLUE CROSS/BLUE SHIELD | Admitting: Physical Therapy

## 2016-11-30 ENCOUNTER — Ambulatory Visit: Payer: BLUE CROSS/BLUE SHIELD | Admitting: Physical Therapy

## 2016-12-07 ENCOUNTER — Ambulatory Visit: Payer: BLUE CROSS/BLUE SHIELD | Attending: Internal Medicine | Admitting: Physical Therapy

## 2016-12-07 DIAGNOSIS — R262 Difficulty in walking, not elsewhere classified: Secondary | ICD-10-CM | POA: Diagnosis not present

## 2016-12-07 DIAGNOSIS — G8929 Other chronic pain: Secondary | ICD-10-CM

## 2016-12-07 DIAGNOSIS — M545 Low back pain: Secondary | ICD-10-CM | POA: Diagnosis present

## 2016-12-07 NOTE — Therapy (Signed)
[place] MAIN [facility] SERVICES [address] [place], [place], [address] Phone: [phone]   Fax:  [phone]  Physical Therapy Treatment /Discharge Summary  Patient Details  Name: Nicole Walter MRN: [id number] Date of Birth: 18-May-1961 No Data Recorded  Encounter Date: 12/07/2016      PT End of Session - 12/07/16 1203    Visit Number 13   Number of Visits 17   Date for PT Re-Evaluation 12/24/16   Authorization Type blue cross blue shield      Past Medical History:  Diagnosis Date  . Abnormal liver enzymes 02/08/2016  . Allergy   . Anxiety   . Chest pain 07/05/2016  . Chronic pain   . Depression   . DJD (degenerative joint disease)    lumbar  . Fibromyalgia   . High cholesterol   . Rash of back 01/28/2016    Past Surgical History:  Procedure Laterality Date  . ANKLE SURGERY     x 2  . BREAST BIOPSY Left 1997  . CHOLECYSTECTOMY    . COLONOSCOPY    . COLONOSCOPY WITH PROPOFOL N/A 02/22/2016   Procedure: COLONOSCOPY WITH PROPOFOL;  Surgeon: Lollie Sails, MD;  Location: [facility] ENDOSCOPY;  Service: Endoscopy;  Laterality: N/A;  . ESOPHAGOGASTRODUODENOSCOPY (EGD) WITH PROPOFOL N/A 02/22/2016   Procedure: ESOPHAGOGASTRODUODENOSCOPY (EGD) WITH PROPOFOL;  Surgeon: Lollie Sails, MD;  Location: [facility] ENDOSCOPY;  Service: Endoscopy;  Laterality: N/A;  . NASAL SINUS SURGERY    . TONSILLECTOMY    . TUBAL LIGATION      There were no vitals filed for this visit.      Subjective Assessment - 12/07/16 1205    Subjective Pt reported her hip injection has been helpful. Her abdominal pain                      Adult Aquatic Therapy - 12/07/16 1206      Aquatic Therapy Subjective   Subjective Pt had no complaints          O: Pt entered/exited the pool via steps with single UE support on rail.  50 ft =1 lap  Exercises performed in 3'6" depth   Stretches : (Cuing provided for proper alignment) ROM of each  joint wall stretch Shoulder stretches    Floating on noodles: 4 laps: shoulder abd 100deg/ add,  BLE add/abd  4 laps: shoulder retraction as in back stroke, and flutter kicks    relaxation with discussion of not overdoing it with fitness classes, recommended 3 max with alternate days of rest. Select on cardio and the other two classes that are gentle and relaxing to minmize mm spasms and for better management of fibromyalgia   6  MWT test                PT Long Term Goals - 12/07/16 1036      PT LONG TERM GOAL #1   Title Patient will report a worst pain of 3/10 on VAS in back, thoracic spine and neck to improve tolerance with ADLs and reduced symptoms with activities.  (neck 2/10 on R, thoracic/ hip  3-4/10)   Baseline 5/10   Time 8   Period Weeks   Status Achieved     PT LONG TERM GOAL #2   Title Patient will be able to perform household work/ chores without increase in symptoms.   Time 8   Period Weeks   Status Achieved     PT  LONG TERM GOAL #3   Title Patient will tolerate sitting unsupported demonstrating erect sitting posture with minimal thoracic kyphosis for 15+ minutes with maximum of 3/10 back pain to demonstrate improved back extensor strength and improved sitting tolerance   Baseline 5 minutes   Time 8   Period Weeks   Status Achieved     PT LONG TERM GOAL #4   Title Patient will increase six minute walk test distance to >1000 ft for progression to community ambulator and improve gait ability (2/1: 1725')    Baseline 700 feet   Time 8   Period Weeks   Status Achieved     PT LONG TERM GOAL #5   Title Pt will demo hip extension B ~20 deg in prone without lumbar lordosis in order to increase stride with gait and walk for longer periods of time   Time 12   Period Weeks   Status Achieved     PT LONG TERM GOAL #6   Title Pt will demo proper alignment and modifications with yoga and fitness routines  in order to maintain health and wellness at the  [facility]   Time 12   Period Weeks   Status Achieved               Plan - 12/07/16 1206    Clinical Impression Statement Pt has achieved 100% of her goals and is ready for d/c. Pt's neck, thoracic, low back pain has decreased.  Pt demo significantly decreased mm tensions, improved posture, and IND with self-management. Educated pt to not overdo it with fitness classes, recommending 3 classes max/per week with alternating days of rest. Select one cardio and the other two classes being gentle and relaxing classes to minmize mm spasms and for better management of fibromyalgia. Educated the signs of cardiac arrest in women with wrapping pain from back of shoulder blades down arm and pt voiced understanding.Pt tolerated today's session without complaints of increased pain behind her shoulder radiating down armpit which she had reports coming on insiduously. Customized exercise to minimize thoracic tensions.  Educated pt to continue monitoring that area and to seek medical attention if it persists. Pt voiced understanding.      Rehab Potential Fair   Clinical Impairments Affecting Rehab Potential chronic pain   PT Frequency Biweekly   PT Duration 12 weeks  16 weeks   PT Treatment/Interventions Aquatic Therapy;Electrical Stimulation;Moist Heat;Therapeutic activities;Gait training;Therapeutic exercise;Manual techniques   Consulted and Agree with Plan of Care Patient      Patient will benefit from skilled therapeutic intervention in order to improve the following deficits and impairments:  Decreased mobility, Difficulty walking, Pain, Decreased strength  Visit Diagnosis: Difficulty in walking, not elsewhere classified  Chronic left-sided low back pain without sciatica     Problem List Patient Active Problem List   Diagnosis Date Noted  . Osteoarthritis of hip (Bilateral) (L>R) 11/20/2016  . Greater trochanteric bursitis (Right) 09/18/2016  . Subacromial bursitis of shoulder joint (Right)  09/18/2016  . Obesity 09/14/2016  . Elevated sedimentation rate 08/31/2016  . Elevated C-reactive protein (CRP) 08/31/2016  . Long term current use of opiate analgesic 07/20/2016  . Long term prescription opiate use 07/20/2016  . Opiate use 07/20/2016  . Chronic upper back pain (Location of Secondary source of pain) (Bilateral) (L>R) 07/20/2016  . Chronic shoulder pain (Location of Tertiary source of pain) (Bilateral) (L>R) 07/20/2016  . Chronic low back pain (Bilateral) (L>R) 07/20/2016  . Chronic knee pain (Right) 07/20/2016  .  Chronic upper extremity pain (Left) 07/20/2016  . Chronic lower extremity pain (Bilateral) (L>R) 07/20/2016  . Chronic abdominal pain 07/20/2016  . Chronic hip pain (Bilateral) (L>R) 05/31/2016  . Chronic Greater trochanteric bursitis (Bilateral) (L>R) 05/31/2016  . Bursitis of right shoulder 05/17/2016  . Skin lesions 04/27/2016  . Hyperlipidemia 02/18/2016  . Cervical osteoarthritis 02/08/2016  . Fibromyalgia 02/08/2016  . Osteopenia 02/08/2016  . Thoracic back pain 01/28/2016  . Chronic neck pain (Location of Primary Source of Pain) (Bilateral) (L>R) 01/28/2016  . Concentration deficit 01/28/2016  . Lumbar spondylosis 01/06/2016  . Anxiety and depression 07/11/2015  . Chronic pain syndrome 07/11/2015  . Combined fat and carbohydrate induced hyperlipemia 07/11/2015    Jerl Mina ,PT, DPT, E-RYT  12/07/2016, 12:06 PM  [place] MAIN [facility] SERVICES [address] [place], [place], [address] Phone: [phone]   Fax:  [phone]  Name: Colletta Wolin MRN: [id number] Date of Birth: 03-Jan-1961

## 2016-12-08 ENCOUNTER — Encounter: Payer: Self-pay | Admitting: Dietician

## 2016-12-08 NOTE — Progress Notes (Signed)
Patient has not rescheduled appointment, which she cancelled on 11/14/16. Sent discharge letter to MD.

## 2016-12-14 ENCOUNTER — Ambulatory Visit: Payer: BLUE CROSS/BLUE SHIELD | Admitting: Physical Therapy

## 2016-12-20 ENCOUNTER — Encounter: Payer: BLUE CROSS/BLUE SHIELD | Admitting: Physical Therapy

## 2016-12-21 ENCOUNTER — Encounter: Payer: Self-pay | Admitting: Pain Medicine

## 2016-12-21 ENCOUNTER — Ambulatory Visit: Payer: BLUE CROSS/BLUE SHIELD | Attending: Pain Medicine | Admitting: Pain Medicine

## 2016-12-21 ENCOUNTER — Encounter (INDEPENDENT_AMBULATORY_CARE_PROVIDER_SITE_OTHER): Payer: Self-pay

## 2016-12-21 VITALS — BP 129/69 | HR 68 | Temp 97.9°F | Resp 18 | Ht 64.0 in | Wt 175.0 lb

## 2016-12-21 DIAGNOSIS — M47896 Other spondylosis, lumbar region: Secondary | ICD-10-CM | POA: Diagnosis not present

## 2016-12-21 DIAGNOSIS — M25571 Pain in right ankle and joints of right foot: Secondary | ICD-10-CM | POA: Insufficient documentation

## 2016-12-21 DIAGNOSIS — Z9049 Acquired absence of other specified parts of digestive tract: Secondary | ICD-10-CM | POA: Diagnosis not present

## 2016-12-21 DIAGNOSIS — Z87891 Personal history of nicotine dependence: Secondary | ICD-10-CM | POA: Diagnosis not present

## 2016-12-21 DIAGNOSIS — Z888 Allergy status to other drugs, medicaments and biological substances status: Secondary | ICD-10-CM | POA: Diagnosis not present

## 2016-12-21 DIAGNOSIS — F329 Major depressive disorder, single episode, unspecified: Secondary | ICD-10-CM | POA: Insufficient documentation

## 2016-12-21 DIAGNOSIS — Z9889 Other specified postprocedural states: Secondary | ICD-10-CM | POA: Insufficient documentation

## 2016-12-21 DIAGNOSIS — Z79899 Other long term (current) drug therapy: Secondary | ICD-10-CM | POA: Insufficient documentation

## 2016-12-21 DIAGNOSIS — M5414 Radiculopathy, thoracic region: Secondary | ICD-10-CM | POA: Diagnosis not present

## 2016-12-21 DIAGNOSIS — M542 Cervicalgia: Secondary | ICD-10-CM | POA: Diagnosis not present

## 2016-12-21 DIAGNOSIS — G8929 Other chronic pain: Secondary | ICD-10-CM

## 2016-12-21 DIAGNOSIS — G894 Chronic pain syndrome: Secondary | ICD-10-CM | POA: Diagnosis not present

## 2016-12-21 DIAGNOSIS — M549 Dorsalgia, unspecified: Secondary | ICD-10-CM | POA: Diagnosis not present

## 2016-12-21 DIAGNOSIS — F419 Anxiety disorder, unspecified: Secondary | ICD-10-CM | POA: Insufficient documentation

## 2016-12-21 DIAGNOSIS — M25572 Pain in left ankle and joints of left foot: Secondary | ICD-10-CM | POA: Insufficient documentation

## 2016-12-21 DIAGNOSIS — M797 Fibromyalgia: Secondary | ICD-10-CM | POA: Diagnosis not present

## 2016-12-21 DIAGNOSIS — Z79891 Long term (current) use of opiate analgesic: Secondary | ICD-10-CM | POA: Diagnosis not present

## 2016-12-21 DIAGNOSIS — M25511 Pain in right shoulder: Secondary | ICD-10-CM | POA: Insufficient documentation

## 2016-12-21 DIAGNOSIS — M25512 Pain in left shoulder: Secondary | ICD-10-CM | POA: Insufficient documentation

## 2016-12-21 DIAGNOSIS — F1190 Opioid use, unspecified, uncomplicated: Secondary | ICD-10-CM

## 2016-12-21 MED ORDER — HYDROCODONE-ACETAMINOPHEN 5-325 MG PO TABS: 1.0000 | ORAL_TABLET | Freq: Four times a day (QID) | ORAL | 0 refills | Status: DC | PRN

## 2016-12-21 MED ORDER — GABAPENTIN 300 MG PO CAPS: 300.0000 mg | ORAL_CAPSULE | Freq: Three times a day (TID) | ORAL | 2 refills | Status: DC

## 2016-12-21 NOTE — Progress Notes (Signed)
Nursing Pain Medication Assessment:  Safety precautions to be maintained throughout the outpatient stay will include: orient to surroundings, keep bed in low position, maintain call bell within reach at all times, provide assistance with transfer out of bed and ambulation.  Medication Inspection Compliance: Pill count conducted under aseptic conditions, in front of the patient. Neither the pills nor the bottle was removed from the patient's sight at any time. Once count was completed pills were immediately returned to the patient in their original bottle.  Medication: Hydrocodone/APAP Pill/Patch Count: 88 of 120 pills remain Bottle Appearance: Standard pharmacy container. Clearly labeled. Filled Date: 02 /7 / 2018 Last Medication intake:  Today

## 2016-12-21 NOTE — Patient Instructions (Addendum)
You were given a prescription for Hydrocodone x 3 and you have a prescription for Gabapentin with 2 refills at the pharmacy.  You will be scheduled for a procedure.  Do not eat or drink for 8 hours.  Bring a driver.     Gabapentin Titration  Medication used: Gabapentin (Generic Name) or Neurontin (Brand Name) 300 mg tablets/capsules  Reasons to stop increasing the dose:  Reason 1: You get good relief of symptoms, in which case there is no need to increase the daily dose any further.    Reason 2: You develop some side effects, such as sleeping all of the time, difficulty concentrating, or becoming disoriented, in which case you need to go down on the dose, to the prior level, where you were not experiencing any side effects. Stay on that dose longer, to allow more time for your body to get use it, before attempting to increase it again.   Reasons to stop increasing the dose: Reason 1: You get good relief of symptoms, in which case there is no need to increase the daily dose any further.  Reason 2: You develop some side effects, such as sleeping all of the time, difficulty concentrating, or becoming disoriented, in which case you need to go down on the dose, to the prior level, where you were not experiencing any side effects. Stay on that dose longer, to allow more time for your body to get use it, before attempting to increase it again.  Steps to increase medication: Step 1: Start by taking 1 (one) tablet at bedtime x 7 (seven) days.  Step 2: After 7 (seven) days of taking 1 (one) tablet at bedtime, increase it to 2 (two) tablets at bedtime. Stay on this dose x 7 (seven) days.  Step 3: After 7 (seven) days of taking 2 (two) tablets at bedtime, increase it to 3 (three) tablets at bedtime. Stay on this dose x another 7 (seven) days.  Step 4: After 7 (seven) days of taking 3 (three) tablet at bedtime, begin taking 1 (one) tablet at noon with lunch. Stay on this dose x another 7 (seven)  days.  Step 5: After 7 (seven) days of taking 3 (three) tablet at bedtime, and 1 (one) tablet at noon, then begin taking 1 (one) tablet in the afternoon with dinner. Stay on this dose x another 7 (seven) days.  Step 6: After 7 (seven) days of taking 3 (three) tablet at bedtime, 1 (one) tablet at noon, and 1 (one) tablet in the afternoon, then begin taking 1 (one) tablet in the morning with breakfast. Stay on this dose x another 7 (seven) days. At this point you should be taking the medicine 4 (four) times a day, or about every 6 (six) hours. This daily regimen of taking the medicine 4 (four) times a day, will be maintained from now on. You should not take any doses any sooner than every 6 (six) hours.  Step 7: After 7 (seven) days of taking 3 (three) tablet at bedtime, 1 (one) tablet at noon, 1 (one) tablet in the afternoon, and 1 (one) tablet in the morning, begin taking 2 (two) tablets at noon with lunch. Stay on this dose x another 7 (seven) days.   Step 8: After 7 (seven) days of taking 3 (three) tablet at bedtime, 2 (two) tablets at noon, 1 (one) tablet in the afternoon, and 1 (one) tablet in the morning, begin taking 2 (two) tablets in the afternoon with dinner. Stay on this  dose x another 7 (seven) days.   Step 9: After 7 (seven) days of taking 3 (three) tablet at bedtime, 2 (two) tablets at noon, 2 (two) tablets in the afternoon, and 1 (one) tablet in the morning, begin taking 2 (two) tablets in the morning with breakfast. Stay on this dose x another 7 (seven) days. At this point you should be taking the medicine 4 (four) times a day, or about every 6 (six) hours. This daily regimen of taking the medicine 4 (four) times a day, will be maintained from now on. You should not take any doses any sooner than every 6 (six) hours.  Step 10: After 7 (seven) days of taking 3 (three) tablet at bedtime, 2 (two) tablets at noon, 2 (two) tablets in the afternoon, and 2 (two) tablets in the morning, begin  taking 3 (three) tablets at noon with lunch. Stay on this dose x another 7 (seven) days.   Step 11: After 7 (seven) days of taking 3 (three) tablet at bedtime, 3 (three) tablets at noon, 2 (two) tablets in the afternoon, and 2 (two) tablets in the morning, begin taking 3 (three) tablets in the afternoon with dinner. Stay on this dose x another 7 (seven) days.   Step 12: After 7 (seven) days of taking 3 (three) tablet at bedtime, 3 (three) tablets at noon, 3 (three) tablets in the afternoon, and 2 (two) tablet in the morning, begin taking 3 (three) tablets in the morning with breakfast. Stay on this dose x another 7 (seven) days. At this point you should be taking the medicine 4 (four) times a day, or about every 6 (six) hours. This daily regimen of taking the medicine 4 (four) times a day, will be maintained from now on.   Endpoint: Once you have reached the maximum dose you can tolerate without side-effects, contact your physician so as to evaluate the results of the regimen.   Questions: Feel free to contact us for any questions or problems at [phone]  Pain Score  Introduction: The pain score used by this practice is the Verbal Numerical Rating Scale (VNRS-11). This is an 11-point scale. It is for adults and children 10 years or older. There are significant differences in how the pain score is reported, used, and applied. Forget everything you learned in the past and learn this scoring system.  General Information: The scale should reflect your current level of pain. Unless you are specifically asked for the level of your worst pain, or your average pain. If you are asked for one of these two, then it should be understood that it is over the past 24 hours.  Basic Activities of Daily Living (ADL): Personal hygiene, dressing, eating, transferring, and using restroom.  Instructions: Most patients tend to report their level of pain as a combination of two factors, their physical pain and their  psychosocial pain. This last one is also known as "suffering" and it is reflection of how physical pain affects you socially and psychologically. From now on, report them separately. From this point on, when asked to report your pain level, report only your physical pain. Use the following table for reference.  Pain Clinic Pain Levels (0-5/10)  Pain Level Score Description  No Pain 0   Mild pain 1 Nagging, annoying, but does not interfere with basic activities of daily living (ADL). Patients are able to eat, bathe, get dressed, toileting (being able to get on and off the toilet and perform personal hygiene functions),  transfer (move in and out of bed or a chair without assistance), and maintain continence (able to control bladder and bowel functions). Blood pressure and heart rate are unaffected. A normal heart rate for a healthy adult ranges from 60 to 100 bpm (beats per minute).   Mild to moderate pain 2 Noticeable and distracting. Impossible to hide from other people. More frequent flare-ups. Still possible to adapt and function close to normal. It can be very annoying and may have occasional stronger flare-ups. With discipline, patients may get used to it and adapt.   Moderate pain 3 Interferes significantly with activities of daily living (ADL). It becomes difficult to feed, bathe, get dressed, get on and off the toilet or to perform personal hygiene functions. Difficult to get in and out of bed or a chair without assistance. Very distracting. With effort, it can be ignored when deeply involved in activities.   Moderately severe pain 4 Impossible to ignore for more than a few minutes. With effort, patients may still be able to manage work or participate in some social activities. Very difficult to concentrate. Signs of autonomic nervous system discharge are evident: dilated pupils (mydriasis); mild sweating (diaphoresis); sleep interference. Heart rate becomes elevated (>115 bpm). Diastolic blood  pressure (lower number) rises above 100 mmHg. Patients find relief in laying down and not moving.   Severe pain 5 Intense and extremely unpleasant. Associated with frowning face and frequent crying. Pain overwhelms the senses.  Ability to do any activity or maintain social relationships becomes significantly limited. Conversation becomes difficult. Pacing back and forth is common, as getting into a comfortable position is nearly impossible. Pain wakes you up from deep sleep. Physical signs will be obvious: pupillary dilation; increased sweating; goosebumps; brisk reflexes; cold, clammy hands and feet; nausea, vomiting or dry heaves; loss of appetite; significant sleep disturbance with inability to fall asleep or to remain asleep. When persistent, significant weight loss is observed due to the complete loss of appetite and sleep deprivation.  Blood pressure and heart rate becomes significantly elevated. Caution: If elevated blood pressure triggers a pounding headache associated with blurred vision, then the patient should immediately seek attention at an urgent or emergency care unit, as these may be signs of an impending stroke.    Emergency Department Pain Levels (6-10/10)  Emergency Room Pain 6 Severely limiting. Requires emergency care and should not be seen or managed at an outpatient pain management facility. Communication becomes difficult and requires great effort. Assistance to reach the emergency department may be required. Facial flushing and profuse sweating along with potentially dangerous increases in heart rate and blood pressure will be evident.   Distressing pain 7 Self-care is very difficult. Assistance is required to transport, or use restroom. Assistance to reach the emergency department will be required. Tasks requiring coordination, such as bathing and getting dressed become very difficult.   Disabling pain 8 Self-care is no longer possible. At this level, pain is disabling. The  individual is unable to do even the most "basic" activities such as walking, eating, bathing, dressing, transferring to a bed, or toileting. Fine motor skills are lost. It is difficult to think clearly.   Incapacitating pain 9 Pain becomes incapacitating. Thought processing is no longer possible. Difficult to remember your own name. Control of movement and coordination are lost.   The worst pain imaginable 10 At this level, most patients pass out from pain. When this level is reached, collapse of the autonomic nervous system occurs, leading to a  sudden drop in blood pressure and heart rate. This in turn results in a temporary and dramatic drop in blood flow to the brain, leading to a loss of consciousness. Fainting is one of the body's self defense mechanisms. Passing out puts the brain in a calmed state and causes it to shut down for a while, in order to begin the healing process.    Summary: 1. Refer to this scale when providing [place] with your pain level. 2. Be accurate and careful when reporting your pain level. This will help with your care. 3. Over-reporting your pain level will lead to loss of credibility. 4. Even a level of 1/10 means that there is pain and will be treated at our facility. 5. High, inaccurate reporting will be documented as "Symptom Exaggeration", leading to loss of credibility and suspicions of possible secondary gains such as obtaining more narcotics, or wanting to appear disabled, for fraudulent reasons. 6. Only pain levels of 5 or below will be seen at our facility. 7. Pain levels of 6 and above will be sent to the Emergency Department and the appointment cancelled.   Epidural Steroid Injection An epidural steroid injection is given to relieve pain in your neck, back, or legs that is caused by the irritation or swelling of a nerve root. This procedure involves injecting a steroid and numbing medicine (anesthetic) into the epidural space. The epidural space is the space  between the outer covering of your spinal cord and the bones that form your backbone (vertebra).  LET [facility] CARE PROVIDER KNOW ABOUT:   Any allergies you have.  All medicines you are taking, including vitamins, herbs, eye drops, creams, and over-the-counter medicines such as aspirin.  Previous problems you or members of your family have had with the use of anesthetics.  Any blood disorders or blood clotting disorders you have.  Previous surgeries you have had.  Medical conditions you have.  RISKS AND COMPLICATIONS Generally, this is a safe procedure. However, as with any procedure, complications can occur. Possible complications of epidural steroid injection include:  Headache.  Bleeding.  Infection.  Allergic reaction to the medicines.  Damage to your nerves. The response to this procedure depends on the underlying cause of the pain and its duration. People who have long-term (chronic) pain are less likely to benefit from epidural steroids than are those people whose pain comes on strong and suddenly.  BEFORE THE PROCEDURE   Ask your health care provider about changing or stopping your regular medicines. You may be advised to stop taking blood-thinning medicines a few days before the procedure.  You may be given medicines to reduce anxiety.  Arrange for someone to take you home after the procedure.  PROCEDURE   You will remain awake during the procedure. You may receive medicine to make you relaxed.  You will be asked to lie on your stomach.  The injection site will be cleaned.  The injection site will be numbed with a medicine (local anesthetic).  A needle will be injected through your skin into the epidural space.  Your health care provider will use an X-ray machine to ensure that the steroid is delivered closest to the affected nerve. You may have minimal discomfort at this time.  Once the needle is in the right position, the local anesthetic and the  steroid will be injected into the epidural space.  The needle will then be removed and a bandage will be applied to the injection site.  AFTER THE PROCEDURE  You may be monitored for a short time before you go home.  You may feel weakness or numbness in your arm or leg, which disappears within hours.  You may be allowed to eat, drink, and take your regular medicine.  You may have soreness at the site of the injection.   This information is not intended to replace advice given to you by your health care provider. Make sure you discuss any questions you have with your health care provider.   Document Released: 01/30/2008 Document Revised: 06/25/2013 Document Reviewed: 04/11/2013 Elsevier Interactive Patient Education 2016 [place]  What are the risk, side effects and possible complications? Generally speaking, most procedures are safe.  However, with any procedure there are risks, side effects, and the possibility of complications.  The risks and complications are dependent upon the sites that are lesioned, or the type of nerve block to be performed.  The closer the procedure is to the spine, the more serious the risks are.  Great care is taken when placing the radio frequency needles, block needles or lesioning probes, but sometimes complications can occur. Infection: Any time there is an injection through the skin, there is a risk of infection.  This is why sterile conditions are used for these blocks. There are four possible types of infection: 1. Localized skin infection. 2. Central Nervous System Infection: This can be in the form of Meningitis, which can be deadly. 3. Epidural Infections: This can be in the form of an epidural abscess, which can cause pressure inside of the spine, causing compression of the spinal cord with subsequent paralysis. This would require an emergency surgery to decompress, and there are no guarantees that the patient  would recover from the paralysis. 4. Discitis: This is an infection of the intervertebral discs. It occurs in about 1% of discography procedures. It is difficult to treat and it may lead to surgery. Pain: the needles have to go through skin and soft tissues, will cause soreness. Damage to internal structures:  The nerves to be lesioned may be near blood vessels or other nerves which can be potentially damaged. Bleeding: Bleeding is more common if the patient is taking blood thinners such as  aspirin, Coumadin, Ticiid, Plavix, etc., or if he/she have some genetic predisposition such as hemophilia. Bleeding into the spinal canal can cause compression of the spinal  cord with subsequent paralysis.  This would require an emergency surgery to decompress and there are no guarantees that the patient would recover from the paralysis. Pneumothorax: Puncturing of a lung is a possibility, every time a needle is introduced in the area of the chest or upper back.  Pneumothorax refers to free air around the collapsed lung(s), inside of the thoracic cavity (chest cavity).  Another two possible complications related to a similar event would include: Hemothorax and Chylothorax. These are variations of the Pneumothorax, where instead of air around the collapsed lung(s), you may have blood or chyle, respectively. Spinal headaches: They may occur with any procedures in the area of the spine. Persistent CSF (Cerebro-Spinal Fluid) leakage: This is a rare problem, but may occur with prolonged intrathecal or epidural catheters either due to the formation of a fistulous track or a dural tear. Nerve damage: By working so close to the spinal cord, there is always a possibility of nerve damage, which could be as serious as a permanent spinal cord injury with paralysis. Death: Although rare, severe deadly allergic reactions known as "Anaphylactic reaction"  can occur to any of the medications used. Worsening of the symptoms: We can  always make thing worse.  What are the chances of something like this happening? Chances of any of this occuring are extremely low.  By statistics, you have more of a chance of getting killed in a motor vehicle accident: while driving to the hospital than any of the above occurring .  Nevertheless, you should be aware that they are possibilities.  In general, it is similar to taking a shower.  Everybody knows that you can slip, hit your head and get killed.  Does that mean that you should not shower again?  Nevertheless always keep in mind that statistics do not mean anything if you happen to be on the wrong side of them.  Even if a procedure has a 1 (one) in a 1,000,000 (million) chance of going wrong, it you happen to be that one..Also, keep in mind that by statistics, you have more of a chance of having something go wrong when taking medications.  Who should not have this procedure? If you are on a blood thinning medication (e.g. Coumadin, Plavix, see list of "Blood Thinners"), or if you have an active infection going on, you should not have the procedure.  If you are taking any blood thinners, please inform your physician.  Preparing for your procedure: 1. Do not eat or drink anything at least eight (8) hours prior to the procedure. 2. Bring a driver with you .  It cannot be a taxi. 3. Come accompanied by an adult that can drive you back, and that is strong enough to help you if your legs get weak or numb from the local anesthetic. 4. Take all of your medicines the morning of the procedure with just enough water to swallow them. 5. If you have diabetes, make sure that you are scheduled to have your procedure done first thing in the morning, whenever possible. 6. If you have diabetes, take only half of your insulin dose and notify our nurse that you have done so as soon as you arrive at the clinic. 7. If you are diabetic, but only take blood sugar pills (oral hypoglycemic), then do not take them on  the morning of your procedure.  You may take them after you have had the procedure. 8. Do not take aspirin or any aspirin-containing medications, at least eleven (11) days prior to the procedure.  They may prolong bleeding. 9. Wear loose fitting clothing that may be easy to take off and that you would not mind if it got stained with Betadine or blood. 10. Do not wear any jewelry or perfume 11. Remove any nail coloring.  It will interfere with some of our monitoring equipment. 12. If you take Metformin for your diabetes, stop it 48 hours prior to the procedure.  NOTE: Remember that this is not meant to be interpreted as a complete list of all possible complications.  Unforeseen problems may occur.  BLOOD THINNERS The following drugs contain aspirin or other products, which can cause increased bleeding during surgery and should not be taken for 2 weeks prior to and 1 week after surgery.  If you should need take something for relief of minor pain, you may take acetaminophen which is found in Tylenol,m Datril, Anacin-3 and Panadol. It is not blood thinner. The products listed below are.  Do not take any of the products listed below in addition to any listed on your instruction sheet.  A.P.C or A.P.C  with Codeine Codeine Phosphate Capsules #3 Ibuprofen Ridaura  ABC compound Congesprin Imuran rimadil  Advil Cope Indocin Robaxisal  Alka-Seltzer Effervescent Pain Reliever and Antacid Coricidin or Coricidin-D  Indomethacin Rufen  Alka-Seltzer plus Cold Medicine Cosprin Ketoprofen S-A-C Tablets  Anacin Analgesic Tablets or Capsules Coumadin Korlgesic Salflex  Anacin Extra Strength Analgesic tablets or capsules CP-2 Tablets Lanoril Salicylate  Anaprox Cuprimine Capsules Levenox Salocol  Anexsia-D Dalteparin Magan Salsalate  Anodynos Darvon compound Magnesium Salicylate Sine-off  Ansaid Dasin Capsules Magsal Sodium Salicylate  Anturane Depen Capsules Marnal Soma  APF Arthritis pain formula Dewitt's  Pills Measurin Stanback  Argesic Dia-Gesic Meclofenamic Sulfinpyrazone  Arthritis Bayer Timed Release Aspirin Diclofenac Meclomen Sulindac  Arthritis pain formula Anacin Dicumarol Medipren Supac  Analgesic (Safety coated) Arthralgen Diffunasal Mefanamic Suprofen  Arthritis Strength Bufferin Dihydrocodeine Mepro Compound Suprol  Arthropan liquid Dopirydamole Methcarbomol with Aspirin Synalgos  ASA tablets/Enseals Disalcid Micrainin Tagament  Ascriptin Doan's Midol Talwin  Ascriptin A/D Dolene Mobidin Tanderil  Ascriptin Extra Strength Dolobid Moblgesic Ticlid  Ascriptin with Codeine Doloprin or Doloprin with Codeine Momentum Tolectin  Asperbuf Duoprin Mono-gesic Trendar  Aspergum Duradyne Motrin or Motrin IB Triminicin  Aspirin plain, buffered or enteric coated Durasal Myochrisine Trigesic  Aspirin Suppositories Easprin Nalfon Trillsate  Aspirin with Codeine Ecotrin Regular or Extra Strength Naprosyn Uracel  Atromid-S Efficin Naproxen Ursinus  Auranofin Capsules Elmiron Neocylate Vanquish  Axotal Emagrin Norgesic Verin  Azathioprine Empirin or Empirin with Codeine Normiflo Vitamin E  Azolid Emprazil Nuprin Voltaren  Bayer Aspirin plain, buffered or children's or timed BC Tablets or powders Encaprin Orgaran Warfarin Sodium  Buff-a-Comp Enoxaparin Orudis Zorpin  Buff-a-Comp with Codeine Equegesic Os-Cal-Gesic   Buffaprin Excedrin plain, buffered or Extra Strength Oxalid   Bufferin Arthritis Strength Feldene Oxphenbutazone   Bufferin plain or Extra Strength Feldene Capsules Oxycodone with Aspirin   Bufferin with Codeine Fenoprofen Fenoprofen Pabalate or Pabalate-SF   Buffets II Flogesic Panagesic   Buffinol plain or Extra Strength Florinal or Florinal with Codeine Panwarfarin   Buf-Tabs Flurbiprofen Penicillamine   Butalbital Compound Four-way cold tablets Penicillin   Butazolidin Fragmin Pepto-Bismol   Carbenicillin Geminisyn Percodan   Carna Arthritis Reliever Geopen Persantine    Carprofen Gold's salt Persistin   Chloramphenicol Goody's Phenylbutazone   Chloromycetin Haltrain Piroxlcam   Clmetidine heparin Plaquenil   Cllnoril Hyco-pap Ponstel   Clofibrate Hydroxy chloroquine Propoxyphen         Before stopping any of these medications, be sure to consult the physician who ordered them.  Some, such as Coumadin (Warfarin) are ordered to prevent or treat serious conditions such as "deep thrombosis", "pumonary embolisms", and other heart problems.  The amount of time that you may need off of the medication may also vary with the medication and the reason for which you were taking it.  If you are taking any of these medications, please make sure you notify your pain physician before you undergo any procedures.Preparing for Procedure with Sedation Instructions: . Oral Intake: Do not eat or drink anything for at least 8 hours prior to your procedure. . Transportation: Public transportation is not allowed. Bring an adult driver. The driver must be physically present in our waiting room before any procedure can be started. Marland Kitchen Physical Assistance: Bring an adult capable of physically assisting you, in the event you need help. . Blood Pressure Medicine: Take your blood pressure medicine with a sip of water the morning of the procedure. . Insulin: Take only  of your normal insulin dose. . Preventing infections:  Shower with an antibacterial soap the morning of your procedure. . Build-up your immune system: Take 1000 mg of Vitamin C with every meal (3 times a day) the day prior to your procedure. . Pregnancy: If you are pregnant, call and cancel the procedure. . Sickness: If you have a cold, fever, or any active infections, call and cancel the procedure. . Arrival: You must be in the facility at least 30 minutes prior to your scheduled procedure. . Children: Do not bring children with you. . Dress appropriately: Bring dark clothing that you would not mind if they get  stained. . Valuables: Do not bring any jewelry or valuables. Procedure appointments are reserved for interventional treatments only. Marland Kitchen No Prescription Refills. . No medication changes will be discussed during procedure appointments. No disability issues will be discussed.

## 2016-12-21 NOTE — Progress Notes (Signed)
Patient's Name: Nicole Walter  MRN: [id number]  Referring Provider: Leone Haven, MD  DOB: November 18, 1960  PCP: Nicole Haven, MD  DOS: 12/21/2016  Note by: Nicole Walter. Nicole Arbour, MD  Service setting: Ambulatory outpatient  Specialty: Interventional Pain Management  Location: [facility] (AMB) Pain Management Facility    Patient type: Established   Primary Reason(s) for Visit: Encounter for prescription drug management & post-procedure evaluation of chronic illness with mild to moderate exacerbation(Level of risk: moderate) CC: Hip Pain (right); Ankle Pain (bilateral); Neck Pain; and Back Pain (upper)  HPI  Nicole Walter is a 56 y.o. year old, female patient, who comes today for a post-procedure evaluation and medication management. She has Anxiety and depression; Chronic pain syndrome; Lumbar spondylosis; Combined fat and carbohydrate induced hyperlipemia; Thoracic back pain; Chronic neck pain (Location of Primary Source of Pain) (Bilateral) (L>R); Concentration deficit; Hyperlipidemia; Cervical osteoarthritis; Fibromyalgia; Osteopenia; Skin lesions; Chronic hip pain (Bilateral) (L>R); Bursitis of right shoulder; Chronic Greater trochanteric bursitis (Bilateral) (L>R); Long term current use of opiate analgesic; Long term prescription opiate use; Opiate use; Chronic upper back pain (Location of Secondary source of pain) (Bilateral) (L>R); Chronic shoulder pain (Location of Tertiary source of pain) (Bilateral) (L>R); Chronic low back pain (Bilateral) (L>R); Chronic knee pain (Right); Chronic upper extremity pain (Left); Chronic lower extremity pain (Bilateral) (L>R); Chronic abdominal pain; Elevated sedimentation rate; Elevated C-reactive protein (CRP); Obesity; Greater trochanteric bursitis (Right); Subacromial bursitis of shoulder joint (Right); Osteoarthritis of hip (Bilateral) (L>R); and Thoracic radiculitis (R>L) on her problem list. Her primarily concern today is the Hip Pain (right); Ankle Pain (bilateral);  Neck Pain; and Back Pain (upper)  Pain Assessment: Self-Reported Pain Score: 4 /10 Clinically the patient looks like a 2/10 Reported level is inconsistent with clinical observations. Information on the proper use of the pain scale provided to the patient today Pain Type: Chronic pain Pain Location: Hip (neck, ankles back) Pain Orientation: Right Pain Descriptors / Indicators: Cramping, Sharp, Aching Pain Frequency: Constant  Nicole Walter was last seen on 11/20/2016 for a procedure. During today's appointment we reviewed Nicole Walter's post-procedure results, as well as her outpatient medication regimen. Chest tightness with higher doses of Lyrica.  Further details on both, my assessment(s), as well as the proposed treatment plan, please see below.  Controlled Substance Pharmacotherapy Assessment REMS (Risk Evaluation and Mitigation Strategy)  Analgesic:Hydrocodone/APAP 5/325 one tablet by mouth 3 times a day (15 mg/dayof hydrocodone) MME/day:78m/day Nicole Jarred RN  12/21/2016  8:48 AM  Signed Nursing Pain Medication Assessment:  Safety precautions to be maintained throughout the outpatient stay will include: orient to surroundings, keep bed in low position, maintain call bell within reach at all times, provide assistance with transfer out of bed and ambulation.  Medication Inspection Compliance: Pill count conducted under aseptic conditions, in front of the patient. Neither the pills nor the bottle was removed from the patient's sight at any time. Once count was completed pills were immediately returned to the patient in their original bottle.  Medication: Hydrocodone/APAP Pill/Patch Count: 88 of 120 pills remain Bottle Appearance: Standard pharmacy container. Clearly labeled. Filled Date: 02 /7 / 2018 Last Medication intake:  Today   Pharmacokinetics: Liberation and absorption (onset of action): WNL Distribution (time to peak effect): WNL Metabolism and excretion (duration of action):  WNL         Pharmacodynamics: Desired effects: Analgesia: Ms. GGanimreports >50% benefit. Functional ability: Patient reports that medication allows her to accomplish basic ADLs Clinically meaningful improvement in function (  CMIF): Sustained CMIF goals met Perceived effectiveness: Described as relatively effective, allowing for increase in activities of daily living (ADL) Undesirable effects: Side-effects or Adverse reactions: None reported Monitoring: [place] PMP: Online review of the past 12-month period conducted. Compliant with practice rules and regulations List of all UDS test(s) done:  Lab Results  Component Value Date   SUMMARY FINAL 07/20/2016   Last UDS on record: No results found for: TOXASSSELUR UDS interpretation: Compliant          Medication Assessment Form: Reviewed. Patient indicates being compliant with therapy Treatment compliance: Compliant Risk Assessment Profile: Aberrant behavior: See prior evaluations. None observed or detected today Comorbid factors increasing risk of overdose: See prior notes. No additional risks detected today Risk of substance use disorder (SUD): Low Opioid Risk Tool (ORT) Total Score: 8  Interpretation Table:  Score <3 = Low Risk for SUD  Score between 4-7 = Moderate Risk for SUD  Score >8 = High Risk for Opioid Abuse   Risk Mitigation Strategies:  Patient Counseling: Covered Patient-Prescriber Agreement (PPA): Present and active  Notification to other healthcare providers: Done  Pharmacologic Plan: No change in therapy, at this time  Post-Procedure Assessment  11/20/2016 Procedure: Diagnostic bilateral intra-articular hip joint injection under fluoroscopic guidance and IV sedation Post-procedure pain score: 0/10 (100% relief) Influential Factors: BMI: 30.04 kg/m Intra-procedural challenges: None observed Assessment challenges: None detected         Post-procedural side-effects, adverse reactions, or complications: None  reported Reported issues: None  Sedation: Sedation provided. When no sedatives are used, the analgesic levels obtained are directly associated to the effectiveness of the local anesthetics. However, when sedation is provided, the level of analgesia obtained during the initial 1 hour following the intervention, is believed to be the result of a combination of factors. These factors may include, but are not limited to: 1. The effectiveness of the local anesthetics used. 2. The effects of the analgesic(s) and/or anxiolytic(s) used. 3. The degree of discomfort experienced by the patient at the time of the procedure. 4. The patients ability and reliability in recalling and recording the events. 5. The presence and influence of possible secondary gains and/or psychosocial factors. Reported result: Relief experienced during the 1st hour after the procedure:  (Left hip 100%, right hip 0%) (Ultra-Short Term Relief) Interpretative annotation: Analgesia during this period is likely to be Local Anesthetic and/or IV Sedative (Analgesic/Anxiolitic) related.          Effects of local anesthetic: The analgesic effects attained during this period are directly associated to the localized infiltration of local anesthetics and therefore cary significant diagnostic value as to the etiological location, or anatomical origin, of the pain. Expected duration of relief is directly dependent on the pharmacodynamics of the local anesthetic used. Long-acting (4-6 hours) anesthetics used.  Reported result: Relief during the next 4 to 6 hour after the procedure:  (left hip 100%, right hip 0%) (Short-Term Relief) Interpretative annotation: Complete relief would suggest area to be the source of the pain.          Long-term benefit: Defined as the period of time past the expected duration of local anesthetics. With the possible exception of prolonged sympathetic blockade from the local anesthetics, benefits during this period are  typically attributed to, or associated with, other factors such as analgesic sensory neuropraxia, antiinflammatory effects, or beneficial biochemical changes provided by agents other than the local anesthetics Reported result: Extended relief following procedure:  (left hip 75%, right hip 50%) (Long-Term   Relief) Interpretative annotation: Good relief. This could suggest inflammation to be a significant component in the etiology to the pain.          Current benefits: Defined as persistent relief that continues at this point in time.   Reported results: Treated area: 75 %       Interpretative annotation: Ongoing benefits would suggest effective therapeutic approach  Interpretation: Results would suggest a successful diagnostic intervention.          Laboratory Chemistry  Inflammation Markers Lab Results  Component Value Date   ESRSEDRATE 37 (H) 07/20/2016   CRP 2.1 (H) 07/20/2016   Renal Function Lab Results  Component Value Date   BUN 18 10/17/2016   CREATININE 0.88 10/17/2016   GFRAA >60 07/20/2016   GFRNONAA >60 07/20/2016   Hepatic Function Lab Results  Component Value Date   AST 16 10/17/2016   ALT 26 10/17/2016   ALBUMIN 4.5 10/17/2016   Electrolytes Lab Results  Component Value Date   NA 139 10/17/2016   K 4.0 10/17/2016   CL 103 10/17/2016   CALCIUM 9.2 10/17/2016   MG 2.1 07/20/2016   Pain Modulating Vitamins Lab Results  Component Value Date   25OHVITD1 58 07/20/2016   25OHVITD2 <1.0 07/20/2016   25OHVITD3 58 07/20/2016   VITAMINB12 987 (H) 07/20/2016   Coagulation Parameters Lab Results  Component Value Date   PLT 284.0 09/14/2016   Cardiovascular Lab Results  Component Value Date   HGB 12.8 09/14/2016   HCT 38.7 09/14/2016   Note: Lab results reviewed.  Recent Diagnostic Imaging Review  Dg C-arm 1-60 Min-no Report  Result Date: 11/20/2016 There is no Radiologist interpretation  for this exam.  Note: Imaging results reviewed.           Meds  The patient has a current medication list which includes the following prescription(s): vitamin d, fluocinonide ointment, fluticasone, gabapentin, hydrocodone-acetaminophen, hydrocodone-acetaminophen, hydrocodone-acetaminophen, hydroxyzine, multi-vitamins, rabeprazole, rabeprazole, simvastatin, sucralfate, tizanidine, trazodone, and trintellix.  Current Outpatient Prescriptions on File Prior to Visit  Medication Sig  . Cholecalciferol (VITAMIN D) 2000 units tablet Take 4,000 Units by mouth daily.  . fluocinonide ointment (LIDEX) 0.05 % APPLY TOPICALLY TWICE A DAY TO ITCHY AREAS ONLY. as needed  . fluticasone (FLONASE) 50 MCG/ACT nasal spray Place 2 sprays into both nostrils daily.   . hydrOXYzine (VISTARIL) 25 MG capsule Take 25 mg by mouth 2 (two) times daily as needed.  . Multiple Vitamin (MULTI-VITAMINS) TABS Take 1 tablet by mouth daily.   . RABEprazole (ACIPHEX) 20 MG tablet Take 20 mg by mouth as needed.   . simvastatin (ZOCOR) 20 MG tablet take 1 tablet by mouth at bedtime  . sucralfate (CARAFATE) 1 g tablet take 1 tablet by mouth three times a day if needed for abdominal pain  . tiZANidine (ZANAFLEX) 2 MG tablet 1 tablet 2-3 times a day only as needed for severe muscles spasm.  . TRINTELLIX 5 MG TABS 5 mg daily.    No current facility-administered medications on file prior to visit.    ROS  Constitutional: Denies any fever or chills Gastrointestinal: No reported hemesis, hematochezia, vomiting, or acute GI distress Musculoskeletal: Denies any acute onset joint swelling, redness, loss of ROM, or weakness Neurological: No reported episodes of acute onset apraxia, aphasia, dysarthria, agnosia, amnesia, paralysis, loss of coordination, or loss of consciousness  Allergies  Nicole Walter is allergic to other and food.  PFSH  Drug: Nicole Walter  reports that she does not use drugs.   Alcohol:  reports that she does not drink alcohol. Tobacco:  reports that she quit smoking about 17  months ago. She started smoking about 42 years ago. She has never used smokeless tobacco. Medical:  has a past medical history of Abnormal liver enzymes (02/08/2016); Allergy; Anxiety; Chest pain (07/05/2016); Chronic pain; Depression; DJD (degenerative joint disease); Fibromyalgia; High cholesterol; and Rash of back (01/28/2016). Family: family history includes Arthritis in her father, paternal grandfather, and paternal grandmother; Cancer in her brother and father; Dementia in her sister; Diabetes in her sister; Drug abuse in her brother, brother, and sister; Heart disease in her mother; Post-traumatic stress disorder in her brother; Stroke in her mother.  Past Surgical History:  Procedure Laterality Date  . ANKLE SURGERY     x 2  . BREAST BIOPSY Left 1997  . CHOLECYSTECTOMY    . COLONOSCOPY    . COLONOSCOPY WITH PROPOFOL N/A 02/22/2016   Procedure: COLONOSCOPY WITH PROPOFOL;  Surgeon: Lollie Sails, MD;  Location: [facility] ENDOSCOPY;  Service: Endoscopy;  Laterality: N/A;  . ESOPHAGOGASTRODUODENOSCOPY (EGD) WITH PROPOFOL N/A 02/22/2016   Procedure: ESOPHAGOGASTRODUODENOSCOPY (EGD) WITH PROPOFOL;  Surgeon: Lollie Sails, MD;  Location: [facility] ENDOSCOPY;  Service: Endoscopy;  Laterality: N/A;  . NASAL SINUS SURGERY    . TONSILLECTOMY    . TUBAL LIGATION     Constitutional Exam  General appearance: Well nourished, well developed, and well hydrated. In no apparent acute distress Vitals:   12/21/16 0833 12/21/16 0840  BP: (!) 127/98 129/69  Pulse: 68   Resp: 18   Temp: 97.9 F (36.6 C)   SpO2: 100%   Weight: 175 lb (79.4 kg)   Height: 5' 4" (1.626 m)    BMI Assessment: Estimated body mass index is 30.04 kg/m as calculated from the following:   Height as of this encounter: 5' 4" (1.626 m).   Weight as of this encounter: 175 lb (79.4 kg).  BMI interpretation table: BMI level Category Range association with higher incidence of chronic pain  <18 kg/m2 Underweight   18.5-24.9 kg/m2 Ideal  body weight   25-29.9 kg/m2 Overweight Increased incidence by 20%  30-34.9 kg/m2 Obese (Class I) Increased incidence by 68%  35-39.9 kg/m2 Severe obesity (Class II) Increased incidence by 136%  >40 kg/m2 Extreme obesity (Class III) Increased incidence by 254%   BMI Readings from Last 4 Encounters:  12/21/16 30.04 kg/m  11/20/16 29.87 kg/m  10/20/16 29.87 kg/m  10/18/16 29.80 kg/m   Wt Readings from Last 4 Encounters:  12/21/16 175 lb (79.4 kg)  11/20/16 174 lb (78.9 kg)  10/20/16 174 lb (78.9 kg)  10/18/16 173 lb 9.6 oz (78.7 kg)  Psych/Mental status: Alert, oriented x 3 (person, place, & time)       Eyes: PERLA Respiratory: No evidence of acute respiratory distress  Cervical Spine Exam  Inspection: No masses, redness, or swelling Alignment: Symmetrical Functional ROM: Unrestricted ROM Stability: No instability detected Muscle strength & Tone: Functionally intact Sensory: Unimpaired Palpation: Non-contributory  Upper Extremity (UE) Exam    Side: Right upper extremity  Side: Left upper extremity  Inspection: No masses, redness, swelling, or asymmetry. No contractures  Inspection: No masses, redness, swelling, or asymmetry. No contractures  Functional ROM: Unrestricted ROM          Functional ROM: Unrestricted ROM          Muscle strength & Tone: Functionally intact  Muscle strength & Tone: Functionally intact  Sensory: Unimpaired  Sensory: Unimpaired  Palpation: Euthermic  Palpation: Euthermic  Specialized Test(s): Deferred         Specialized Test(s): Deferred          Thoracic Spine Exam  Inspection: No masses, redness, or swelling Alignment: Symmetrical Functional ROM: Unrestricted ROM Stability: No instability detected Sensory: Unimpaired Muscle strength & Tone: Functionally intact Palpation: Non-contributory  Lumbar Spine Exam  Inspection: No masses, redness, or swelling Alignment: Symmetrical Functional ROM: Unrestricted ROM Stability: No instability  detected Muscle strength & Tone: Functionally intact Sensory: Unimpaired Palpation: Non-contributory Provocative Tests: Lumbar Hyperextension and rotation test: evaluation deferred today       Patrick's Maneuver: evaluation deferred today              Gait & Posture Assessment  Ambulation: Unassisted Gait: Relatively normal for age and body habitus Posture: WNL   Lower Extremity Exam    Side: Right lower extremity  Side: Left lower extremity  Inspection: No masses, redness, swelling, or asymmetry. No contractures  Inspection: No masses, redness, swelling, or asymmetry. No contractures  Functional ROM: Unrestricted ROM          Functional ROM: Unrestricted ROM          Muscle strength & Tone: Functionally intact  Muscle strength & Tone: Functionally intact  Sensory: Unimpaired  Sensory: Unimpaired  Palpation: No palpable anomalies  Palpation: No palpable anomalies   Assessment  Primary Diagnosis & Pertinent Problem List: The primary encounter diagnosis was Chronic pain syndrome. Diagnoses of Chronic neck pain (Location of Primary Source of Pain) (Bilateral) (L>R), Chronic upper back pain (Location of Secondary source of pain) (Bilateral) (L>R), Chronic shoulder pain (Location of Tertiary source of pain) (Bilateral) (L>R), Fibromyalgia, Long term current use of opiate analgesic, Opiate use, and Thoracic radiculitis (R>L) were also pertinent to this visit.  Status Diagnosis  Controlled Controlled Controlled 1. Chronic pain syndrome   2. Chronic neck pain (Location of Primary Source of Pain) (Bilateral) (L>R)   3. Chronic upper back pain (Location of Secondary source of pain) (Bilateral) (L>R)   4. Chronic shoulder pain (Location of Tertiary source of pain) (Bilateral) (L>R)   5. Fibromyalgia   6. Long term current use of opiate analgesic   7. Opiate use   8. Thoracic radiculitis (R>L)      Plan of Care  Pharmacotherapy (Medications Ordered): Meds ordered this encounter   Medications  . HYDROcodone-acetaminophen (NORCO/VICODIN) 5-325 MG tablet    Sig: Take 1 tablet by mouth every 6 (six) hours as needed for moderate pain.    Dispense:  120 tablet    Refill:  0    Do not add this medication to the electronic "Automatic Refill" notification system. Patient may have prescription filled one day early if pharmacy is closed on scheduled refill date. Do not fill until: 02/11/17 To last until: 03/13/17  . HYDROcodone-acetaminophen (NORCO/VICODIN) 5-325 MG tablet    Sig: Take 1 tablet by mouth every 6 (six) hours as needed for moderate pain.    Dispense:  120 tablet    Refill:  0    Do not add this medication to the electronic "Automatic Refill" notification system. Patient may have prescription filled one day early if pharmacy is closed on scheduled refill date. Do not fill until: 03/13/17 To last until: 04/12/17  . HYDROcodone-acetaminophen (NORCO/VICODIN) 5-325 MG tablet    Sig: Take 1 tablet by mouth every 6 (six) hours as needed for moderate pain.    Dispense:  120 tablet    Refill:  0  Do not add this medication to the electronic "Automatic Refill" notification system. Patient may have prescription filled one day early if pharmacy is closed on scheduled refill date. Do not fill until: 01/12/17 To last until: 02/11/17  . gabapentin (NEURONTIN) 300 MG capsule    Sig: Take 1-3 capsules (300-900 mg total) by mouth every 8 (eight) hours. Follow titration schedule    Dispense:  270 capsule    Refill:  2    Do not place this medication, or any other prescription from our practice, on "Automatic Refill". Patient may have prescription filled one day early if pharmacy is closed on scheduled refill date.   New Prescriptions   GABAPENTIN (NEURONTIN) 300 MG CAPSULE    Take 1-3 capsules (300-900 mg total) by mouth every 8 (eight) hours. Follow titration schedule   Medications administered today: Nicole Walter had no medications administered during this  visit. Lab-work, procedure(s), and/or referral(s): Orders Placed This Encounter  Procedures  . Thoracic Epidural Injection   Imaging and/or referral(s): None  Interventional therapies: Planned, scheduled, and/or pending:   Diagnostic left cervical epidural steroid injection #3 under fluoroscopic guidance and IV sedation.    Considering:   Diagnostic bilateral cervical facet block Possible bilateral cervical facet radiofrequency ablation Diagnostic left-sided cervical epidural steroid injection Diagnostic bilateral intra-articular shoulder joint injection Diagnostic bilateral suprascapular nerve block Possible bilateral suprascapular nerve radiofrequency ablation Diagnostic bilateral lumbar facet block Possible bilateral lumbar facet radiofrequency ablation  Diagnostic bilateral intra-articular hip joint injection  Diagnostic bilateral femoral and obturator tickler branch blocks  Possible bilateral hip joint radiofrequency ablation  Diagnostic right intra-articular knee joint injection Possible series of 5, right-sided, intra-articular knee joint injections with Hyalgan.  Possible right sided genicular nerve block Possible right sided genicular nerve radiofrequency ablation Possible left-sided lumbar epidural steroid injection    Palliative PRN treatment(s):   None at this time.   Provider-requested follow-up: Return in about 3 months (around 03/20/2017) for (Nurse Practitioner) Med-Mgmt, in addition.  Future Appointments Date Time Provider [place]  01/01/2017 8:45 AM Milinda Pointer, MD [facility] None  01/15/2017 10:00 AM Nicole Haven, MD [facility] None   Primary Care Physician: Nicole Haven, MD Location: [facility] Note by: Nicole Walter. Nicole Walter, M.D, DABA, DABAPM, DABPM, DABIPP, FIPP Date: 12/21/2016; Time: 8:50 AM  Pain Score Disclaimer: We use the NRS-11 scale. This is a self-reported, subjective measurement of pain  severity with only modest accuracy. It is used primarily to identify changes within a particular patient. It must be understood that outpatient pain scales are significantly less accurate that those used for research, where they can be applied under ideal controlled circumstances with minimal exposure to variables. In reality, the score is likely to be a combination of pain intensity and pain affect, where pain affect describes the degree of emotional arousal or changes in action readiness caused by the sensory experience of pain. Factors such as social and work situation, setting, emotional state, anxiety levels, expectation, and prior pain experience may influence pain perception and show large inter-individual differences that may also be affected by time variables.  Patient instructions provided during this appointment: Patient Instructions   You were given a prescription for Hydrocodone x 3 and you have a prescription for Gabapentin with 2 refills at the pharmacy.  You will be scheduled for a procedure.  Do not eat or drink for 8 hours.  Bring a driver.     Gabapentin Titration  Medication used: Gabapentin (Generic Name) or Neurontin (  Brand Name) 300 mg tablets/capsules  Reasons to stop increasing the dose:  Reason 1: You get good relief of symptoms, in which case there is no need to increase the daily dose any further.    Reason 2: You develop some side effects, such as sleeping all of the time, difficulty concentrating, or becoming disoriented, in which case you need to go down on the dose, to the prior level, where you were not experiencing any side effects. Stay on that dose longer, to allow more time for your body to get use it, before attempting to increase it again.   Reasons to stop increasing the dose: Reason 1: You get good relief of symptoms, in which case there is no need to increase the daily dose any further.  Reason 2: You develop some side effects, such as sleeping all of the  time, difficulty concentrating, or becoming disoriented, in which case you need to go down on the dose, to the prior level, where you were not experiencing any side effects. Stay on that dose longer, to allow more time for your body to get use it, before attempting to increase it again.  Steps to increase medication: Step 1: Start by taking 1 (one) tablet at bedtime x 7 (seven) days.  Step 2: After 7 (seven) days of taking 1 (one) tablet at bedtime, increase it to 2 (two) tablets at bedtime. Stay on this dose x 7 (seven) days.  Step 3: After 7 (seven) days of taking 2 (two) tablets at bedtime, increase it to 3 (three) tablets at bedtime. Stay on this dose x another 7 (seven) days.  Step 4: After 7 (seven) days of taking 3 (three) tablet at bedtime, begin taking 1 (one) tablet at noon with lunch. Stay on this dose x another 7 (seven) days.  Step 5: After 7 (seven) days of taking 3 (three) tablet at bedtime, and 1 (one) tablet at noon, then begin taking 1 (one) tablet in the afternoon with dinner. Stay on this dose x another 7 (seven) days.  Step 6: After 7 (seven) days of taking 3 (three) tablet at bedtime, 1 (one) tablet at noon, and 1 (one) tablet in the afternoon, then begin taking 1 (one) tablet in the morning with breakfast. Stay on this dose x another 7 (seven) days. At this point you should be taking the medicine 4 (four) times a day, or about every 6 (six) hours. This daily regimen of taking the medicine 4 (four) times a day, will be maintained from now on. You should not take any doses any sooner than every 6 (six) hours.  Step 7: After 7 (seven) days of taking 3 (three) tablet at bedtime, 1 (one) tablet at noon, 1 (one) tablet in the afternoon, and 1 (one) tablet in the morning, begin taking 2 (two) tablets at noon with lunch. Stay on this dose x another 7 (seven) days.   Step 8: After 7 (seven) days of taking 3 (three) tablet at bedtime, 2 (two) tablets at noon, 1 (one) tablet in the  afternoon, and 1 (one) tablet in the morning, begin taking 2 (two) tablets in the afternoon with dinner. Stay on this dose x another 7 (seven) days.   Step 9: After 7 (seven) days of taking 3 (three) tablet at bedtime, 2 (two) tablets at noon, 2 (two) tablets in the afternoon, and 1 (one) tablet in the morning, begin taking 2 (two) tablets in the morning with breakfast. Stay on this dose x another 7 (  seven) days. At this point you should be taking the medicine 4 (four) times a day, or about every 6 (six) hours. This daily regimen of taking the medicine 4 (four) times a day, will be maintained from now on. You should not take any doses any sooner than every 6 (six) hours.  Step 10: After 7 (seven) days of taking 3 (three) tablet at bedtime, 2 (two) tablets at noon, 2 (two) tablets in the afternoon, and 2 (two) tablets in the morning, begin taking 3 (three) tablets at noon with lunch. Stay on this dose x another 7 (seven) days.   Step 11: After 7 (seven) days of taking 3 (three) tablet at bedtime, 3 (three) tablets at noon, 2 (two) tablets in the afternoon, and 2 (two) tablets in the morning, begin taking 3 (three) tablets in the afternoon with dinner. Stay on this dose x another 7 (seven) days.   Step 12: After 7 (seven) days of taking 3 (three) tablet at bedtime, 3 (three) tablets at noon, 3 (three) tablets in the afternoon, and 2 (two) tablet in the morning, begin taking 3 (three) tablets in the morning with breakfast. Stay on this dose x another 7 (seven) days. At this point you should be taking the medicine 4 (four) times a day, or about every 6 (six) hours. This daily regimen of taking the medicine 4 (four) times a day, will be maintained from now on.   Endpoint: Once you have reached the maximum dose you can tolerate without side-effects, contact your physician so as to evaluate the results of the regimen.   Questions: Feel free to contact us for any questions or problems at [phone]  Pain  Score  Introduction: The pain score used by this practice is the Verbal Numerical Rating Scale (VNRS-11). This is an 11-point scale. It is for adults and children 10 years or older. There are significant differences in how the pain score is reported, used, and applied. Forget everything you learned in the past and learn this scoring system.  General Information: The scale should reflect your current level of pain. Unless you are specifically asked for the level of your worst pain, or your average pain. If you are asked for one of these two, then it should be understood that it is over the past 24 hours.  Basic Activities of Daily Living (ADL): Personal hygiene, dressing, eating, transferring, and using restroom.  Instructions: Most patients tend to report their level of pain as a combination of two factors, their physical pain and their psychosocial pain. This last one is also known as "suffering" and it is reflection of how physical pain affects you socially and psychologically. From now on, report them separately. From this point on, when asked to report your pain level, report only your physical pain. Use the following table for reference.  Pain Clinic Pain Levels (0-5/10)  Pain Level Score Description  No Pain 0   Mild pain 1 Nagging, annoying, but does not interfere with basic activities of daily living (ADL). Patients are able to eat, bathe, get dressed, toileting (being able to get on and off the toilet and perform personal hygiene functions), transfer (move in and out of bed or a chair without assistance), and maintain continence (able to control bladder and bowel functions). Blood pressure and heart rate are unaffected. A normal heart rate for a healthy adult ranges from 60 to 100 bpm (beats per minute).   Mild to moderate pain 2 Noticeable and distracting. Impossible   to hide from other people. More frequent flare-ups. Still possible to adapt and function close to normal. It can be very  annoying and may have occasional stronger flare-ups. With discipline, patients may get used to it and adapt.   Moderate pain 3 Interferes significantly with activities of daily living (ADL). It becomes difficult to feed, bathe, get dressed, get on and off the toilet or to perform personal hygiene functions. Difficult to get in and out of bed or a chair without assistance. Very distracting. With effort, it can be ignored when deeply involved in activities.   Moderately severe pain 4 Impossible to ignore for more than a few minutes. With effort, patients may still be able to manage work or participate in some social activities. Very difficult to concentrate. Signs of autonomic nervous system discharge are evident: dilated pupils (mydriasis); mild sweating (diaphoresis); sleep interference. Heart rate becomes elevated (>115 bpm). Diastolic blood pressure (lower number) rises above 100 mmHg. Patients find relief in laying down and not moving.   Severe pain 5 Intense and extremely unpleasant. Associated with frowning face and frequent crying. Pain overwhelms the senses.  Ability to do any activity or maintain social relationships becomes significantly limited. Conversation becomes difficult. Pacing back and forth is common, as getting into a comfortable position is nearly impossible. Pain wakes you up from deep sleep. Physical signs will be obvious: pupillary dilation; increased sweating; goosebumps; brisk reflexes; cold, clammy hands and feet; nausea, vomiting or dry heaves; loss of appetite; significant sleep disturbance with inability to fall asleep or to remain asleep. When persistent, significant weight loss is observed due to the complete loss of appetite and sleep deprivation.  Blood pressure and heart rate becomes significantly elevated. Caution: If elevated blood pressure triggers a pounding headache associated with blurred vision, then the patient should immediately seek attention at an urgent or  emergency care unit, as these may be signs of an impending stroke.    Emergency Department Pain Levels (6-10/10)  Emergency Room Pain 6 Severely limiting. Requires emergency care and should not be seen or managed at an outpatient pain management facility. Communication becomes difficult and requires great effort. Assistance to reach the emergency department may be required. Facial flushing and profuse sweating along with potentially dangerous increases in heart rate and blood pressure will be evident.   Distressing pain 7 Self-care is very difficult. Assistance is required to transport, or use restroom. Assistance to reach the emergency department will be required. Tasks requiring coordination, such as bathing and getting dressed become very difficult.   Disabling pain 8 Self-care is no longer possible. At this level, pain is disabling. The individual is unable to do even the most "basic" activities such as walking, eating, bathing, dressing, transferring to a bed, or toileting. Fine motor skills are lost. It is difficult to think clearly.   Incapacitating pain 9 Pain becomes incapacitating. Thought processing is no longer possible. Difficult to remember your own name. Control of movement and coordination are lost.   The worst pain imaginable 10 At this level, most patients pass out from pain. When this level is reached, collapse of the autonomic nervous system occurs, leading to a sudden drop in blood pressure and heart rate. This in turn results in a temporary and dramatic drop in blood flow to the brain, leading to a loss of consciousness. Fainting is one of the body's self defense mechanisms. Passing out puts the brain in a calmed state and causes it to shut down for a while,   in order to begin the healing process.    Summary: 1. Refer to this scale when providing [place] with your pain level. 2. Be accurate and careful when reporting your pain level. This will help with your care. 3. Over-reporting  your pain level will lead to loss of credibility. 4. Even a level of 1/10 means that there is pain and will be treated at our facility. 5. High, inaccurate reporting will be documented as "Symptom Exaggeration", leading to loss of credibility and suspicions of possible secondary gains such as obtaining more narcotics, or wanting to appear disabled, for fraudulent reasons. 6. Only pain levels of 5 or below will be seen at our facility. 7. Pain levels of 6 and above will be sent to the Emergency Department and the appointment cancelled.   Epidural Steroid Injection An epidural steroid injection is given to relieve pain in your neck, back, or legs that is caused by the irritation or swelling of a nerve root. This procedure involves injecting a steroid and numbing medicine (anesthetic) into the epidural space. The epidural space is the space between the outer covering of your spinal cord and the bones that form your backbone (vertebra).  LET [facility] CARE PROVIDER KNOW ABOUT:   Any allergies you have.  All medicines you are taking, including vitamins, herbs, eye drops, creams, and over-the-counter medicines such as aspirin.  Previous problems you or members of your family have had with the use of anesthetics.  Any blood disorders or blood clotting disorders you have.  Previous surgeries you have had.  Medical conditions you have.  RISKS AND COMPLICATIONS Generally, this is a safe procedure. However, as with any procedure, complications can occur. Possible complications of epidural steroid injection include:  Headache.  Bleeding.  Infection.  Allergic reaction to the medicines.  Damage to your nerves. The response to this procedure depends on the underlying cause of the pain and its duration. People who have long-term (chronic) pain are less likely to benefit from epidural steroids than are those people whose pain comes on strong and suddenly.  BEFORE THE PROCEDURE   Ask your  health care provider about changing or stopping your regular medicines. You may be advised to stop taking blood-thinning medicines a few days before the procedure.  You may be given medicines to reduce anxiety.  Arrange for someone to take you home after the procedure.  PROCEDURE   You will remain awake during the procedure. You may receive medicine to make you relaxed.  You will be asked to lie on your stomach.  The injection site will be cleaned.  The injection site will be numbed with a medicine (local anesthetic).  A needle will be injected through your skin into the epidural space.  Your health care provider will use an X-ray machine to ensure that the steroid is delivered closest to the affected nerve. You may have minimal discomfort at this time.  Once the needle is in the right position, the local anesthetic and the steroid will be injected into the epidural space.  The needle will then be removed and a bandage will be applied to the injection site.  AFTER THE PROCEDURE   You may be monitored for a short time before you go home.  You may feel weakness or numbness in your arm or leg, which disappears within hours.  You may be allowed to eat, drink, and take your regular medicine.  You may have soreness at the site of the injection.   This  information is not intended to replace advice given to you by your health care provider. Make sure you discuss any questions you have with your health care provider.   Document Released: 01/30/2008 Document Revised: 06/25/2013 Document Reviewed: 04/11/2013 Elsevier Interactive Patient Education 2016 [place]  What are the risk, side effects and possible complications? Generally speaking, most procedures are safe.  However, with any procedure there are risks, side effects, and the possibility of complications.  The risks and complications are dependent upon the sites that are lesioned, or the  type of nerve block to be performed.  The closer the procedure is to the spine, the more serious the risks are.  Great care is taken when placing the radio frequency needles, block needles or lesioning probes, but sometimes complications can occur. Infection: Any time there is an injection through the skin, there is a risk of infection.  This is why sterile conditions are used for these blocks. There are four possible types of infection: 1. Localized skin infection. 2. Central Nervous System Infection: This can be in the form of Meningitis, which can be deadly. 3. Epidural Infections: This can be in the form of an epidural abscess, which can cause pressure inside of the spine, causing compression of the spinal cord with subsequent paralysis. This would require an emergency surgery to decompress, and there are no guarantees that the patient would recover from the paralysis. 4. Discitis: This is an infection of the intervertebral discs. It occurs in about 1% of discography procedures. It is difficult to treat and it may lead to surgery. Pain: the needles have to go through skin and soft tissues, will cause soreness. Damage to internal structures:  The nerves to be lesioned may be near blood vessels or other nerves which can be potentially damaged. Bleeding: Bleeding is more common if the patient is taking blood thinners such as  aspirin, Coumadin, Ticiid, Plavix, etc., or if he/she have some genetic predisposition such as hemophilia. Bleeding into the spinal canal can cause compression of the spinal  cord with subsequent paralysis.  This would require an emergency surgery to decompress and there are no guarantees that the patient would recover from the paralysis. Pneumothorax: Puncturing of a lung is a possibility, every time a needle is introduced in the area of the chest or upper back.  Pneumothorax refers to free air around the collapsed lung(s), inside of the thoracic cavity (chest cavity).  Another two  possible complications related to a similar event would include: Hemothorax and Chylothorax. These are variations of the Pneumothorax, where instead of air around the collapsed lung(s), you may have blood or chyle, respectively. Spinal headaches: They may occur with any procedures in the area of the spine. Persistent CSF (Cerebro-Spinal Fluid) leakage: This is a rare problem, but may occur with prolonged intrathecal or epidural catheters either due to the formation of a fistulous track or a dural tear. Nerve damage: By working so close to the spinal cord, there is always a possibility of nerve damage, which could be as serious as a permanent spinal cord injury with paralysis. Death: Although rare, severe deadly allergic reactions known as "Anaphylactic reaction" can occur to any of the medications used. Worsening of the symptoms: We can always make thing worse.  What are the chances of something like this happening? Chances of any of this occuring are extremely low.  By statistics, you have more of a chance of getting killed in a motor vehicle accident:  while driving to the hospital than any of the above occurring .  Nevertheless, you should be aware that they are possibilities.  In general, it is similar to taking a shower.  Everybody knows that you can slip, hit your head and get killed.  Does that mean that you should not shower again?  Nevertheless always keep in mind that statistics do not mean anything if you happen to be on the wrong side of them.  Even if a procedure has a 1 (one) in a 1,000,000 (million) chance of going wrong, it you happen to be that one..Also, keep in mind that by statistics, you have more of a chance of having something go wrong when taking medications.  Who should not have this procedure? If you are on a blood thinning medication (e.g. Coumadin, Plavix, see list of "Blood Thinners"), or if you have an active infection going on, you should not have the procedure.  If you are  taking any blood thinners, please inform your physician.  Preparing for your procedure: 1. Do not eat or drink anything at least eight (8) hours prior to the procedure. 2. Bring a driver with you .  It cannot be a taxi. 3. Come accompanied by an adult that can drive you back, and that is strong enough to help you if your legs get weak or numb from the local anesthetic. 4. Take all of your medicines the morning of the procedure with just enough water to swallow them. 5. If you have diabetes, make sure that you are scheduled to have your procedure done first thing in the morning, whenever possible. 6. If you have diabetes, take only half of your insulin dose and notify our nurse that you have done so as soon as you arrive at the clinic. 7. If you are diabetic, but only take blood sugar pills (oral hypoglycemic), then do not take them on the morning of your procedure.  You may take them after you have had the procedure. 8. Do not take aspirin or any aspirin-containing medications, at least eleven (11) days prior to the procedure.  They may prolong bleeding. 9. Wear loose fitting clothing that may be easy to take off and that you would not mind if it got stained with Betadine or blood. 10. Do not wear any jewelry or perfume 11. Remove any nail coloring.  It will interfere with some of our monitoring equipment. 12. If you take Metformin for your diabetes, stop it 48 hours prior to the procedure.  NOTE: Remember that this is not meant to be interpreted as a complete list of all possible complications.  Unforeseen problems may occur.  BLOOD THINNERS The following drugs contain aspirin or other products, which can cause increased bleeding during surgery and should not be taken for 2 weeks prior to and 1 week after surgery.  If you should need take something for relief of minor pain, you may take acetaminophen which is found in Tylenol,m Datril, Anacin-3 and Panadol. It is not blood thinner. The products  listed below are.  Do not take any of the products listed below in addition to any listed on your instruction sheet.  A.P.C or A.P.C with Codeine Codeine Phosphate Capsules #3 Ibuprofen Ridaura  ABC compound Congesprin Imuran rimadil  Advil Cope Indocin Robaxisal  Alka-Seltzer Effervescent Pain Reliever and Antacid Coricidin or Coricidin-D  Indomethacin Rufen  Alka-Seltzer plus Cold Medicine Cosprin Ketoprofen S-A-C Tablets  Anacin Analgesic Tablets or Capsules Coumadin Korlgesic Salflex  Anacin Extra Strength   Analgesic tablets or capsules CP-2 Tablets Lanoril Salicylate  Anaprox Cuprimine Capsules Levenox Salocol  Anexsia-D Dalteparin Magan Salsalate  Anodynos Darvon compound Magnesium Salicylate Sine-off  Ansaid Dasin Capsules Magsal Sodium Salicylate  Anturane Depen Capsules Marnal Soma  APF Arthritis pain formula Dewitt's Pills Measurin Stanback  Argesic Dia-Gesic Meclofenamic Sulfinpyrazone  Arthritis Bayer Timed Release Aspirin Diclofenac Meclomen Sulindac  Arthritis pain formula Anacin Dicumarol Medipren Supac  Analgesic (Safety coated) Arthralgen Diffunasal Mefanamic Suprofen  Arthritis Strength Bufferin Dihydrocodeine Mepro Compound Suprol  Arthropan liquid Dopirydamole Methcarbomol with Aspirin Synalgos  ASA tablets/Enseals Disalcid Micrainin Tagament  Ascriptin Doan's Midol Talwin  Ascriptin A/D Dolene Mobidin Tanderil  Ascriptin Extra Strength Dolobid Moblgesic Ticlid  Ascriptin with Codeine Doloprin or Doloprin with Codeine Momentum Tolectin  Asperbuf Duoprin Mono-gesic Trendar  Aspergum Duradyne Motrin or Motrin IB Triminicin  Aspirin plain, buffered or enteric coated Durasal Myochrisine Trigesic  Aspirin Suppositories Easprin Nalfon Trillsate  Aspirin with Codeine Ecotrin Regular or Extra Strength Naprosyn Uracel  Atromid-S Efficin Naproxen Ursinus  Auranofin Capsules Elmiron Neocylate Vanquish  Axotal Emagrin Norgesic Verin  Azathioprine Empirin or Empirin with  Codeine Normiflo Vitamin E  Azolid Emprazil Nuprin Voltaren  Bayer Aspirin plain, buffered or children's or timed BC Tablets or powders Encaprin Orgaran Warfarin Sodium  Buff-a-Comp Enoxaparin Orudis Zorpin  Buff-a-Comp with Codeine Equegesic Os-Cal-Gesic   Buffaprin Excedrin plain, buffered or Extra Strength Oxalid   Bufferin Arthritis Strength Feldene Oxphenbutazone   Bufferin plain or Extra Strength Feldene Capsules Oxycodone with Aspirin   Bufferin with Codeine Fenoprofen Fenoprofen Pabalate or Pabalate-SF   Buffets II Flogesic Panagesic   Buffinol plain or Extra Strength Florinal or Florinal with Codeine Panwarfarin   Buf-Tabs Flurbiprofen Penicillamine   Butalbital Compound Four-way cold tablets Penicillin   Butazolidin Fragmin Pepto-Bismol   Carbenicillin Geminisyn Percodan   Carna Arthritis Reliever Geopen Persantine   Carprofen Gold's salt Persistin   Chloramphenicol Goody's Phenylbutazone   Chloromycetin Haltrain Piroxlcam   Clmetidine heparin Plaquenil   Cllnoril Hyco-pap Ponstel   Clofibrate Hydroxy chloroquine Propoxyphen         Before stopping any of these medications, be sure to consult the physician who ordered them.  Some, such as Coumadin (Warfarin) are ordered to prevent or treat serious conditions such as "deep thrombosis", "pumonary embolisms", and other heart problems.  The amount of time that you may need off of the medication may also vary with the medication and the reason for which you were taking it.  If you are taking any of these medications, please make sure you notify your pain physician before you undergo any procedures.Preparing for Procedure with Sedation Instructions: . Oral Intake: Do not eat or drink anything for at least 8 hours prior to your procedure. . Transportation: Public transportation is not allowed. Bring an adult driver. The driver must be physically present in our waiting room before any procedure can be started. Marland Kitchen Physical Assistance:  Bring an adult capable of physically assisting you, in the event you need help. . Blood Pressure Medicine: Take your blood pressure medicine with a sip of water the morning of the procedure. . Insulin: Take only  of your normal insulin dose. . Preventing infections: Shower with an antibacterial soap the morning of your procedure. . Build-up your immune system: Take 1000 mg of Vitamin C with every meal (3 times a day) the day prior to your procedure. . Pregnancy: If you are pregnant, call and cancel the procedure. . Sickness: If you have a cold, fever, or  any active infections, call and cancel the procedure. . Arrival: You must be in the facility at least 30 minutes prior to your scheduled procedure. . Children: Do not bring children with you. . Dress appropriately: Bring dark clothing that you would not mind if they get stained. . Valuables: Do not bring any jewelry or valuables. Procedure appointments are reserved for interventional treatments only. . No Prescription Refills. . No medication changes will be discussed during procedure appointments. No disability issues will be discussed.  

## 2017-01-01 ENCOUNTER — Ambulatory Visit
Admission: RE | Admit: 2017-01-01 | Discharge: 2017-01-01 | Disposition: A | Discharge status: Home / Self Care | Payer: BLUE CROSS/BLUE SHIELD | Source: Ambulatory Visit | Attending: Pain Medicine | Admitting: Pain Medicine

## 2017-01-01 ENCOUNTER — Encounter: Payer: Self-pay | Admitting: Pain Medicine

## 2017-01-01 ENCOUNTER — Ambulatory Visit (HOSPITAL_BASED_OUTPATIENT_CLINIC_OR_DEPARTMENT_OTHER): Payer: BLUE CROSS/BLUE SHIELD | Admitting: Pain Medicine

## 2017-01-01 VITALS — BP 131/61 | HR 63 | Temp 96.5°F | Resp 16 | Ht 64.0 in | Wt 177.0 lb

## 2017-01-01 DIAGNOSIS — M546 Pain in thoracic spine: Secondary | ICD-10-CM | POA: Diagnosis not present

## 2017-01-01 DIAGNOSIS — Z888 Allergy status to other drugs, medicaments and biological substances status: Secondary | ICD-10-CM | POA: Insufficient documentation

## 2017-01-01 DIAGNOSIS — M47894 Other spondylosis, thoracic region: Secondary | ICD-10-CM | POA: Insufficient documentation

## 2017-01-01 DIAGNOSIS — M25551 Pain in right hip: Secondary | ICD-10-CM | POA: Insufficient documentation

## 2017-01-01 DIAGNOSIS — G8929 Other chronic pain: Secondary | ICD-10-CM | POA: Insufficient documentation

## 2017-01-01 DIAGNOSIS — M5414 Radiculopathy, thoracic region: Secondary | ICD-10-CM | POA: Insufficient documentation

## 2017-01-01 MED ORDER — MIDAZOLAM HCL 5 MG/5ML IJ SOLN
1.0000 mg | INTRAMUSCULAR | Status: DC | PRN
Start: 2017-01-01 — End: 2017-01-01
  Administered 2017-01-01: 3 mg via INTRAVENOUS
  Filled 2017-01-01: qty 5

## 2017-01-01 MED ORDER — FENTANYL CITRATE (PF) 100 MCG/2ML IJ SOLN
25.0000 ug | INTRAMUSCULAR | Status: DC | PRN
  Administered 2017-01-01: 50 ug via INTRAVENOUS
  Filled 2017-01-01: qty 2

## 2017-01-01 MED ORDER — LIDOCAINE HCL (PF) 1 % IJ SOLN
10.0000 mL | Freq: Once | INTRAMUSCULAR | Status: AC
  Administered 2017-01-01: 5 mL
  Filled 2017-01-01: qty 10

## 2017-01-01 MED ORDER — ROPIVACAINE HCL 5 MG/ML IJ SOLN
0.5000 mL | Freq: Once | INTRAMUSCULAR | Status: AC
  Administered 2017-01-01: 20 mL via EPIDURAL
  Filled 2017-01-01: qty 20

## 2017-01-01 MED ORDER — LACTATED RINGERS IV SOLN
1000.0000 mL | Freq: Once | INTRAVENOUS | Status: AC
  Administered 2017-01-01: 1000 mL via INTRAVENOUS

## 2017-01-01 MED ORDER — SODIUM CHLORIDE 0.9% FLUSH
2.0000 mL | Freq: Once | INTRAVENOUS | Status: AC
  Administered 2017-01-01: 2 mL

## 2017-01-01 MED ORDER — SODIUM CHLORIDE 0.9 % IJ SOLN
INTRAMUSCULAR | Status: AC
  Filled 2017-01-01: qty 10

## 2017-01-01 MED ORDER — DEXAMETHASONE SODIUM PHOSPHATE 4 MG/ML IJ SOLN
10.0000 mg | Freq: Once | INTRAMUSCULAR | Status: AC
  Administered 2017-01-01: 10 mg
  Filled 2017-01-01: qty 3

## 2017-01-01 MED ORDER — IOPAMIDOL (ISOVUE-M 200) INJECTION 41%
10.0000 mL | Freq: Once | INTRAMUSCULAR | Status: AC
  Administered 2017-01-01: 10 mL via EPIDURAL
  Filled 2017-01-01: qty 10

## 2017-01-01 NOTE — Progress Notes (Signed)
Patient's Name: Nicole Walter  MRN: [id number]  Referring Provider: Leone Haven, MD  DOB: 07/03/1961  PCP: Leone Haven, MD  DOS: 01/01/2017  Note by: Kathlen Brunswick. Dossie Arbour, MD  Service setting: Ambulatory outpatient  Location: [facility] (AMB) Pain Management Facility  Visit type: Procedure  Specialty: Interventional Pain Management  Patient type: Established   Primary Reason for Visit: Interventional Pain Management Treatment. CC: Back Pain (mid to upper) and Hip Pain (right)  Procedure:  Anesthesia, Analgesia, Anxiolysis:  Type: Therapeutic Inter-Laminar Thoracic Epidural Block Region: Posterior Thoracolumbar Level: T4-5 Laterality: Midline       Type: Local Anesthesia with Moderate (Conscious) Sedation Local Anesthetic: Lidocaine 1% Route: Intravenous (IV) IV Access: Secured Sedation: Meaningful verbal contact was maintained at all times during the procedure  Indication(s): Analgesia and Anxiety  Indications: 1. Thoracic radiculitis (R>L)   2. Chronic bilateral thoracic back pain   3. Thoracic spondylosis    Pain Score: Pre-procedure: 2 /10 Post-procedure: 0-No pain/10  Pre-op Assessment:  Previous date of service: 12/21/16 Service provided: Med Refill, Evaluation Nicole Walter is a 56 y.o. (year old), female patient, seen today for interventional treatment. She  has a past surgical history that includes Cholecystectomy; Tubal ligation; Ankle surgery; Nasal sinus surgery; Tonsillectomy; Colonoscopy; Esophagogastroduodenoscopy (egd) with propofol (N/A, 02/22/2016); Colonoscopy with propofol (N/A, 02/22/2016); and Breast biopsy (Left, 1997). Her primarily concern today is the Back Pain (mid to upper) and Hip Pain (right)  Initial Vital Signs: Blood pressure 136/70, pulse 63, temperature 98.2 F (36.8 C), resp. rate 18, height 5\' 4"  (1.626 m), weight 177 lb (80.3 kg), SpO2 98 %. BMI: 30.38 kg/m  Risk Assessment: Allergies: Reviewed. She is allergic to other and food.  Allergy  Precautions: None required Coagulopathies: "Reviewed. None identified.  Blood-thinner therapy: None at this time Active Infection(s): Reviewed. None identified. Nicole Walter is afebrile  Site Confirmation: Nicole Walter was asked to confirm the procedure and laterality before marking the site Procedure checklist: Completed Consent: Before the procedure and under the influence of no sedative(s), amnesic(s), or anxiolytics, the patient was informed of the treatment options, risks and possible complications. To fulfill our ethical and legal obligations, as recommended by the American Medical Association's Code of Ethics, I have informed the patient of my clinical impression; the nature and purpose of the treatment or procedure; the risks, benefits, and possible complications of the intervention; the alternatives, including doing nothing; the risk(s) and benefit(s) of the alternative treatment(s) or procedure(s); and the risk(s) and benefit(s) of doing nothing. The patient was provided information about the general risks and possible complications associated with the procedure. These may include, but are not limited to: failure to achieve desired goals, infection, bleeding, organ or nerve damage, allergic reactions, paralysis, and death. In addition, the patient was informed of those risks and complications associated to Spine-related procedures, such as failure to decrease pain; infection (i.e.: Meningitis, epidural or intraspinal abscess); bleeding (i.e.: epidural hematoma, subarachnoid hemorrhage, or any other type of intraspinal or peri-dural bleeding); organ or nerve damage (i.e.: Any type of peripheral nerve, nerve root, or spinal cord injury) with subsequent damage to sensory, motor, and/or autonomic systems, resulting in permanent pain, numbness, and/or weakness of one or several areas of the body; allergic reactions; (i.e.: anaphylactic reaction); and/or death. Furthermore, the patient was informed of those  risks and complications associated with the medications. These include, but are not limited to: allergic reactions (i.e.: anaphylactic or anaphylactoid reaction(s)); adrenal axis suppression; blood sugar elevation that in diabetics may  result in ketoacidosis or comma; water retention that in patients with history of congestive heart failure may result in shortness of breath, pulmonary edema, and decompensation with resultant heart failure; weight gain; swelling or edema; medication-induced neural toxicity; particulate matter embolism and blood vessel occlusion with resultant organ, and/or nervous system infarction; and/or aseptic necrosis of one or more joints. Finally, the patient was informed that Medicine is not an exact science; therefore, there is also the possibility of unforeseen or unpredictable risks and/or possible complications that may result in a catastrophic outcome. The patient indicated having understood very clearly. We have given the patient no guarantees and we have made no promises. Enough time was given to the patient to ask questions, all of which were answered to the patient's satisfaction. Nicole Walter has indicated that she wanted to continue with the procedure. Attestation: I, the ordering provider, attest that I have discussed with the patient the benefits, risks, side-effects, alternatives, likelihood of achieving goals, and potential problems during recovery for the procedure that I have provided informed consent. Date: 01/01/2017; Time: 9:12 AM  Pre-Procedure Preparation:  Monitoring: As per clinic protocol. Respiration, ETCO2, SpO2, BP, heart rate and rhythm monitor placed and checked for adequate function Safety Precautions: Patient was assessed for positional comfort and pressure points before starting the procedure. Time-out: I initiated and conducted the "Time-out" before starting the procedure, as per protocol. The patient was asked to participate by confirming the accuracy of  the "Time Out" information. Verification of the correct person, site, and procedure were performed and confirmed by me, the nursing staff, and the patient. "Time-out" conducted as per Joint Commission's Universal Protocol (UP.01.01.01). "Time-out" Date & Time: 01/01/2017; 0947 hrs.  Description of Procedure Process:  Position: Prone Target Area: For Epidural Steroid injection(s), the target area is the  interlaminar space, initially targeting the lower border of the superior vertebral body lamina. Approach: Interlaminar approach. Area Prepped: Entire Posterior Thoracolumbar Region Prepping solution: ChloraPrep (2% chlorhexidine gluconate and 70% isopropyl alcohol) Safety Precautions: Aspiration looking for blood return was conducted prior to all injections. At no point did we inject any substances, as a needle was being advanced. No attempts were made at seeking any paresthesias. Safe injection practices and needle disposal techniques used. Medications properly checked for expiration dates. SDV (single dose vial) medications used. Description of the Procedure: Protocol guidelines were followed. The patient was placed in position over the fluoroscopy table. The target area was identified and the area prepped in the usual manner. Skin & deeper tissues infiltrated with local anesthetic. Appropriate amount of time allowed to pass for local anesthetics to take effect. The procedure needles were then advanced to the target area. The inferior aspect of the superior lamina was contacted and the needle walked caudad, until the lamina was cleared. The epidural space was identified using "loss-of-resistance technique" with 0.9% PF-NSS (2-52mL), in a low friction 10cc LOR glass syringe. Proper needle placement was secured. Negative aspiration confirmed. Solution injected in intermittent fashion, asking for systemic symptoms every 0.5 cc of injectate. The needles were then removed and the area cleansed, making sure to  leave some of the prepping solution behind to take advantage of its long term bactericidal properties. Vitals:   01/01/17 0956 01/01/17 1006 01/01/17 1016 01/01/17 1026  BP: 111/68 (!) 144/76 133/65 131/61  Pulse:      Resp: 18 16 16 16   Temp:   (!) 96.5 F (35.8 C)   SpO2: 95% 95% 94% 97%  Weight:  Height:        Start Time: 0948 hrs. End Time: 0955 hrs. Materials:  Needle(s) Type: Epidural needle Gauge: 17G Length: 3.5-in Medication(s): We administered fentaNYL, lactated ringers, midazolam, dexamethasone, iopamidol, lidocaine (PF), sodium chloride flush, and ropivacaine (PF) 5 mg/mL (0.5%). Please see chart orders for dosing details.  Imaging Guidance (Spinal):  Type of Imaging Technique: Fluoroscopy Guidance (Spinal) Indication(s): Assistance in needle guidance and placement for procedures requiring needle placement in or near specific anatomical locations not easily accessible without such assistance. Exposure Time: Please see nurses notes. Contrast: Before injecting any contrast, we confirmed that the patient did not have an allergy to iodine, shellfish, or radiological contrast. Once satisfactory needle placement was completed at the desired level, radiological contrast was injected. Contrast injected under live fluoroscopy. No contrast complications. See chart for type and volume of contrast used. Fluoroscopic Guidance: I was personally present during the use of fluoroscopy. "Tunnel Vision Technique" used to obtain the best possible view of the target area. Parallax error corrected before commencing the procedure. "Direction-depth-direction" technique used to introduce the needle under continuous pulsed fluoroscopy. Once target was reached, antero-posterior, oblique, and lateral fluoroscopic projection used confirm needle placement in all planes. Images permanently stored in EMR. Interpretation: I personally interpreted the imaging intraoperatively. Adequate needle placement  confirmed in multiple planes. Appropriate spread of contrast into desired area was observed. No evidence of afferent or efferent intravascular uptake. No intrathecal or subarachnoid spread observed. Permanent images saved into the patient's record.  Antibiotic Prophylaxis:  Indication(s): None identified Antibiotic given: None  Post-operative Assessment:  EBL: None Complications: No immediate post-treatment complications observed by team, or reported by patient. Note: The patient tolerated the entire procedure well. A repeat set of vitals were taken after the procedure and the patient was kept under observation following institutional policy, for this type of procedure. Post-procedural neurological assessment was performed, showing return to baseline, prior to discharge. The patient was provided with post-procedure discharge instructions, including a section on how to identify potential problems. Should any problems arise concerning this procedure, the patient was given instructions to immediately contact us, at any time, without hesitation. In any case, we plan to contact the patient by telephone for a follow-up status report regarding this interventional procedure. Comments:  No additional relevant information.  Plan of Care  Disposition: Discharge home  Discharge Date & Time: 01/01/2017; 1029 hrs.  Physician-requested Follow-up:  Return in about 2 weeks (around 01/15/2017) for Post-Procedure evaluation.  Future Appointments Date Time Provider [place]  01/15/2017 10:00 AM Leone Haven, MD [facility] None  01/22/2017 2:00 PM Milinda Pointer, MD [facility] None   Medications ordered for procedure: Meds ordered this encounter  Medications  . fentaNYL (SUBLIMAZE) injection 25-50 mcg    Make sure Narcan is available in the pyxis when using this medication. In the event of respiratory depression (RR< 8/min): Titrate NARCAN (naloxone) in increments of 0.1 to 0.2 mg IV at 2-3 minute  intervals, until desired degree of reversal.  . lactated ringers infusion 1,000 mL  . midazolam (VERSED) 5 MG/5ML injection 1-2 mg    Make sure Flumazenil is available in the pyxis when using this medication. If oversedation occurs, administer 0.2 mg IV over 15 sec. If after 45 sec no response, administer 0.2 mg again over 1 min; may repeat at 1 min intervals; not to exceed 4 doses (1 mg)  . dexamethasone (DECADRON) injection 10 mg  . iopamidol (ISOVUE-M) 41 % intrathecal injection 10 mL  .  lidocaine (PF) (XYLOCAINE) 1 % injection 10 mL  . sodium chloride flush (NS) 0.9 % injection 2 mL  . ropivacaine (PF) 5 mg/mL (0.5%) (NAROPIN) injection 0.5 mL   Medications administered: We administered fentaNYL, lactated ringers, midazolam, dexamethasone, iopamidol, lidocaine (PF), sodium chloride flush, and ropivacaine (PF) 5 mg/mL (0.5%).  See the medical record for exact dosing, route, and time of administration.  Lab-work, Procedure(s), & Referral(s) Ordered: Orders Placed This Encounter  Procedures  . Thoracic Epidural Injection  . DG C-Arm 1-60 Min-No Report  . Discharge instructions  . Follow-up  . Informed Consent Details: Transcribe to consent form and obtain patient signature  . Provider attestation of informed consent for procedure/surgical case  . Verify informed consent   Imaging Ordered: Results for orders placed in visit on 11/20/16  DG C-Arm 1-60 Min-No Report   Narrative There is no Radiologist interpretation  for this exam.   New Prescriptions   No medications on file   Primary Care Physician: Leone Haven, MD Location: [facility] Note by: Kathlen Brunswick. Dossie Arbour, M.D, DABA, DABAPM, DABPM, DABIPP, FIPP Date: 01/01/2017; Time: 11:12 AM  Disclaimer:  Medicine is not an Chief Strategy Officer. The only guarantee in medicine is that nothing is guaranteed. It is important to note that the decision to proceed with this intervention was based on the  information collected from the patient. The Data and conclusions were drawn from the patient's questionnaire, the interview, and the physical examination. Because the information was provided in large part by the patient, it cannot be guaranteed that it has not been purposely or unconsciously manipulated. Every effort has been made to obtain as much relevant data as possible for this evaluation. It is important to note that the conclusions that lead to this procedure are derived in large part from the available data. Always take into account that the treatment will also be dependent on availability of resources and existing treatment guidelines, considered by other Pain Management Practitioners as being common knowledge and practice, at the time of the intervention. For Medico-Legal purposes, it is also important to point out that variation in procedural techniques and pharmacological choices are the acceptable norm. The indications, contraindications, technique, and results of the above procedure should only be interpreted and judged by a Board-Certified Interventional Pain Specialist with extensive familiarity and expertise in the same exact procedure and technique. Attempts at providing opinions without similar or greater experience and expertise than that of the treating physician will be considered as inappropriate and unethical, and shall result in a formal complaint to the state medical board and applicable specialty societies.  Instructions provided at this appointment: Patient Instructions  Pain Management Discharge Instructions  General Discharge Instructions :  If you need to reach your doctor call: Monday-Friday 8:00 am - 4:00 pm at [phone] or toll free [phone].  After clinic hours [phone] to have operator reach doctor.  Bring all of your medication bottles to all your appointments in the pain clinic.  To cancel or reschedule your appointment with Pain Management please  remember to call 24 hours in advance to avoid a fee.  Refer to the educational materials which you have been given on: General Risks, I had my Procedure. Discharge Instructions, Post Sedation.  Post Procedure Instructions:  The drugs you were given will stay in your system until tomorrow, so for the next 24 hours you should not drive, make any legal decisions or drink any alcoholic beverages.  You may eat anything you  prefer, but it is better to start with liquids then soups and crackers, and gradually work up to solid foods.  Please notify your doctor immediately if you have any unusual bleeding, trouble breathing or pain that is not related to your normal pain.  Depending on the type of procedure that was done, some parts of your body may feel week and/or numb.  This usually clears up by tonight or the next day.  Walk with the use of an assistive device or accompanied by an adult for the 24 hours.  You may use ice on the affected area for the first 24 hours.  Put ice in a Ziploc bag and cover with a towel and place against area 15 minutes on 15 minutes off.  You may switch to heat after 24 hours.Epidural Steroid Injection An epidural steroid injection is a shot of steroid medicine and numbing medicine that is given into the space between the spinal cord and the bones in your back (epidural space). The shot helps relieve pain caused by an irritated or swollen nerve root. The amount of pain relief you get from the injection depends on what is causing the nerve to be swollen and irritated, and how long your pain lasts. You are more likely to benefit from this injection if your pain is strong and comes on suddenly rather than if you have had pain for a long time. Tell a health care provider about:  Any allergies you have.  All medicines you are taking, including vitamins, herbs, eye drops, creams, and over-the-counter medicines.  Any problems you or family members have had with anesthetic  medicines.  Any blood disorders you have.  Any surgeries you have had.  Any medical conditions you have.  Whether you are pregnant or may be pregnant. What are the risks? Generally, this is a safe procedure. However, problems may occur, including:  Headache.  Bleeding.  Infection.  Allergic reaction to medicines.  Damage to your nerves. What happens before the procedure? Staying hydrated  Follow instructions from your health care provider about hydration, which may include:  Up to 2 hours before the procedure - you may continue to drink clear liquids, such as water, clear fruit juice, black coffee, and plain tea. Eating and drinking restrictions  Follow instructions from your health care provider about eating and drinking, which may include:  8 hours before the procedure - stop eating heavy meals or foods such as meat, fried foods, or fatty foods.  6 hours before the procedure - stop eating light meals or foods, such as toast or cereal.  6 hours before the procedure - stop drinking milk or drinks that contain milk.  2 hours before the procedure - stop drinking clear liquids. Medicine  You may be given medicines to lower anxiety.  Ask your health care provider about:  Changing or stopping your regular medicines. This is especially important if you are taking diabetes medicines or blood thinners.  Taking medicines such as aspirin and ibuprofen. These medicines can thin your blood. Do not take these medicines before your procedure if your health care provider instructs you not to. General instructions  Plan to have someone take you home from the hospital or clinic. What happens during the procedure?  You may receive a medicine to help you relax (sedative).  You will be asked to lie on your abdomen.  The injection site will be cleaned.  A numbing medicine (local anesthetic) will be used to numb the injection site.  A needle will be inserted through your skin into  the epidural space. You may feel some discomfort when this happens. An X-ray machine will be used to make sure the needle is put as close as possible to the affected nerve.  A steroid medicine and a local anesthetic will be injected into the epidural space.  The needle will be removed.  A bandage (dressing) will be put over the injection site. What happens after the procedure?  Your blood pressure, heart rate, breathing rate, and blood oxygen level will be monitored until the medicines you were given have worn off.  Your arm or leg may feel weak or numb for a few hours.  The injection site may feel sore.  Do not drive for 24 hours if you received a sedative. This information is not intended to replace advice given to you by your health care provider. Make sure you discuss any questions you have with your health care provider. Document Released: 01/30/2008 Document Revised: 04/05/2016 Document Reviewed: 02/08/2016 Elsevier Interactive Patient Education  2017 [employer].

## 2017-01-01 NOTE — Patient Instructions (Signed)
Pain Management Discharge Instructions  General Discharge Instructions :  If you need to reach your doctor call: Monday-Friday 8:00 am - 4:00 pm at [phone] or toll free [phone].  After clinic hours [phone] to have operator reach doctor.  Bring all of your medication bottles to all your appointments in the pain clinic.  To cancel or reschedule your appointment with Pain Management please remember to call 24 hours in advance to avoid a fee.  Refer to the educational materials which you have been given on: General Risks, I had my Procedure. Discharge Instructions, Post Sedation.  Post Procedure Instructions:  The drugs you were given will stay in your system until tomorrow, so for the next 24 hours you should not drive, make any legal decisions or drink any alcoholic beverages.  You may eat anything you prefer, but it is better to start with liquids then soups and crackers, and gradually work up to solid foods.  Please notify your doctor immediately if you have any unusual bleeding, trouble breathing or pain that is not related to your normal pain.  Depending on the type of procedure that was done, some parts of your body may feel week and/or numb.  This usually clears up by tonight or the next day.  Walk with the use of an assistive device or accompanied by an adult for the 24 hours.  You may use ice on the affected area for the first 24 hours.  Put ice in a Ziploc bag and cover with a towel and place against area 15 minutes on 15 minutes off.  You may switch to heat after 24 hours.Epidural Steroid Injection An epidural steroid injection is a shot of steroid medicine and numbing medicine that is given into the space between the spinal cord and the bones in your back (epidural space). The shot helps relieve pain caused by an irritated or swollen nerve root. The amount of pain relief you get from the injection depends on what is causing the nerve to be swollen and irritated,  and how long your pain lasts. You are more likely to benefit from this injection if your pain is strong and comes on suddenly rather than if you have had pain for a long time. Tell a health care provider about:  Any allergies you have.  All medicines you are taking, including vitamins, herbs, eye drops, creams, and over-the-counter medicines.  Any problems you or family members have had with anesthetic medicines.  Any blood disorders you have.  Any surgeries you have had.  Any medical conditions you have.  Whether you are pregnant or may be pregnant. What are the risks? Generally, this is a safe procedure. However, problems may occur, including:  Headache.  Bleeding.  Infection.  Allergic reaction to medicines.  Damage to your nerves. What happens before the procedure? Staying hydrated  Follow instructions from your health care provider about hydration, which may include:  Up to 2 hours before the procedure - you may continue to drink clear liquids, such as water, clear fruit juice, black coffee, and plain tea. Eating and drinking restrictions  Follow instructions from your health care provider about eating and drinking, which may include:  8 hours before the procedure - stop eating heavy meals or foods such as meat, fried foods, or fatty foods.  6 hours before the procedure - stop eating light meals or foods, such as toast or cereal.  6 hours before the procedure - stop drinking milk or drinks that contain milk.    2 hours before the procedure - stop drinking clear liquids. Medicine  You may be given medicines to lower anxiety.  Ask your health care provider about:  Changing or stopping your regular medicines. This is especially important if you are taking diabetes medicines or blood thinners.  Taking medicines such as aspirin and ibuprofen. These medicines can thin your blood. Do not take these medicines before your procedure if your health care provider instructs  you not to. General instructions  Plan to have someone take you home from the hospital or clinic. What happens during the procedure?  You may receive a medicine to help you relax (sedative).  You will be asked to lie on your abdomen.  The injection site will be cleaned.  A numbing medicine (local anesthetic) will be used to numb the injection site.  A needle will be inserted through your skin into the epidural space. You may feel some discomfort when this happens. An X-ray machine will be used to make sure the needle is put as close as possible to the affected nerve.  A steroid medicine and a local anesthetic will be injected into the epidural space.  The needle will be removed.  A bandage (dressing) will be put over the injection site. What happens after the procedure?  Your blood pressure, heart rate, breathing rate, and blood oxygen level will be monitored until the medicines you were given have worn off.  Your arm or leg may feel weak or numb for a few hours.  The injection site may feel sore.  Do not drive for 24 hours if you received a sedative. This information is not intended to replace advice given to you by your health care provider. Make sure you discuss any questions you have with your health care provider. Document Released: 01/30/2008 Document Revised: 04/05/2016 Document Reviewed: 02/08/2016 [employer] Interactive Patient Education  2017 [employer]  

## 2017-01-01 NOTE — Progress Notes (Signed)
Safety precautions to be maintained throughout the outpatient stay will include: orient to surroundings, keep bed in low position, maintain call bell within reach at all times, provide assistance with transfer out of bed and ambulation.  

## 2017-01-02 ENCOUNTER — Other Ambulatory Visit: Payer: Self-pay | Admitting: Nurse Practitioner

## 2017-01-02 ENCOUNTER — Telehealth: Payer: Self-pay | Admitting: *Deleted

## 2017-01-02 NOTE — Telephone Encounter (Signed)
Spoke with patient re; procedure on yesterday. States she felt a little dizzy on yesterday and a flushed feeling in her face.  Denies any difficulty breathing or SOB.  States that she is feeling better this morning.  Instructed to continue activity level today as tolerated and to make sure that she is eating and drinking as normal.  Patient verbalizes u/o information.

## 2017-01-10 ENCOUNTER — Telehealth: Payer: Self-pay | Admitting: *Deleted

## 2017-01-10 NOTE — Telephone Encounter (Signed)
Patient states its carotic artery screening for plaque in the neck and abdmonial aortic aneurism screening peripheral arterial disease screening and heart rhythm fibrilation, patient states her sister had this done and she states her insurance will cover this if it is screening

## 2017-01-10 NOTE — Telephone Encounter (Signed)
I am unsure exactly what that is. Please check with the patient regarding details. Thanks.

## 2017-01-10 NOTE — Telephone Encounter (Signed)
Pt questioned to have cardio life line screening,if necessary  Pt contact [phone]

## 2017-01-10 NOTE — Telephone Encounter (Signed)
Please advise 

## 2017-01-12 NOTE — Telephone Encounter (Signed)
I'm not sure it's necessary for her to have these screening tests done. She's had an EKG previously and most recently it did not show atrial fibrillation. There is no recommendation for her to have carotid artery screening for plaque and given her age there is no recommendation for her to have abdominal aortic aneurysm screening. There is insufficient evidence to know if screening for peripheral artery disease would be beneficial. We can discuss at her next office visit if she would like. Thanks.

## 2017-01-15 ENCOUNTER — Encounter: Payer: Self-pay | Admitting: Family Medicine

## 2017-01-15 ENCOUNTER — Ambulatory Visit (INDEPENDENT_AMBULATORY_CARE_PROVIDER_SITE_OTHER): Payer: BLUE CROSS/BLUE SHIELD | Admitting: Family Medicine

## 2017-01-15 VITALS — BP 120/68 | HR 69 | Temp 97.8°F | Wt 178.0 lb

## 2017-01-15 DIAGNOSIS — G8929 Other chronic pain: Secondary | ICD-10-CM

## 2017-01-15 DIAGNOSIS — R0602 Shortness of breath: Secondary | ICD-10-CM | POA: Diagnosis not present

## 2017-01-15 DIAGNOSIS — E6609 Other obesity due to excess calories: Secondary | ICD-10-CM | POA: Diagnosis not present

## 2017-01-15 DIAGNOSIS — F418 Other specified anxiety disorders: Secondary | ICD-10-CM

## 2017-01-15 DIAGNOSIS — F419 Anxiety disorder, unspecified: Secondary | ICD-10-CM

## 2017-01-15 DIAGNOSIS — R109 Unspecified abdominal pain: Secondary | ICD-10-CM

## 2017-01-15 DIAGNOSIS — R1013 Epigastric pain: Secondary | ICD-10-CM

## 2017-01-15 DIAGNOSIS — Z6830 Body mass index (BMI) 30.0-30.9, adult: Secondary | ICD-10-CM | POA: Diagnosis not present

## 2017-01-15 DIAGNOSIS — F32A Depression, unspecified: Secondary | ICD-10-CM

## 2017-01-15 DIAGNOSIS — F329 Major depressive disorder, single episode, unspecified: Secondary | ICD-10-CM

## 2017-01-15 LAB — COMPREHENSIVE METABOLIC PANEL
ALT: 56 U/L — ABNORMAL HIGH (ref 6–29)
AST: 44 U/L — ABNORMAL HIGH (ref 10–35)
Albumin: 4.4 g/dL (ref 3.6–5.1)
Alkaline Phosphatase: 71 U/L (ref 33–130)
BUN: 13 mg/dL (ref 7–25)
CHLORIDE: 103 mmol/L (ref 98–110)
CO2: 30 mmol/L (ref 20–31)
Calcium: 9.3 mg/dL (ref 8.6–10.4)
Creat: 0.89 mg/dL (ref 0.50–1.05)
GLUCOSE: 96 mg/dL (ref 65–99)
POTASSIUM: 4.3 mmol/L (ref 3.5–5.3)
Sodium: 141 mmol/L (ref 135–146)
Total Bilirubin: 0.4 mg/dL (ref 0.2–1.2)
Total Protein: 6.7 g/dL (ref 6.1–8.1)

## 2017-01-15 LAB — CBC
HCT: 37.8 % (ref 35.0–45.0)
Hemoglobin: 12.4 g/dL (ref 11.7–15.5)
MCH: 30 pg (ref 27.0–33.0)
MCHC: 32.8 g/dL (ref 32.0–36.0)
MCV: 91.5 fL (ref 80.0–100.0)
MPV: 10 fL (ref 7.5–12.5)
PLATELETS: 300 10*3/uL (ref 140–400)
RBC: 4.13 MIL/uL (ref 3.80–5.10)
RDW: 13.8 % (ref 11.0–15.0)
WBC: 7.1 10*3/uL (ref 3.8–10.8)

## 2017-01-15 LAB — TSH: TSH: 1.3 m[IU]/L

## 2017-01-15 NOTE — Assessment & Plan Note (Signed)
Advised continued diet and exercise. Discussed that I would not be prescribing any medication for weight loss until we figure out why she is getting short of breath. She voiced understanding.

## 2017-01-15 NOTE — Progress Notes (Signed)
Pre visit review using our clinic review tool, if applicable. No additional management support is needed unless otherwise documented below in the visit note. 

## 2017-01-15 NOTE — Assessment & Plan Note (Addendum)
Patient's report of shortness of breath does not have any specific aspects to indicate a specific cause. No wheezing with this. It is not specifically exertional or specifically orthopnea. EKG today is reassuring. We are going to check some lab work as outlined below. Potentially could be an anginal equivalent or related to CHF given family history and patient does have several risk factors. Potentially could be deconditioning or related to her obesity. We will refer to cardiology. She's given return precautions.

## 2017-01-15 NOTE — Telephone Encounter (Signed)
Patient is coming in today at 10:00

## 2017-01-15 NOTE — Progress Notes (Signed)
Nicole Rumps, MD Phone: [phone]  Nicole Walter is a 56 y.o. female who presents today for follow-up.  Anxiety/depression: this is slightly better. She does have some anxiety and some depression. She is seeing psychiatry and a therapist. She's taking hydroxyzine and this has not helped that much for anxiety. Trintellix has been beneficial. No SI or HI.  Obesity: She has been exercising several times a week. She's cut out sodas. Drinking more water.  Shortness of breath: She notes intermittently over the last month she has been getting short of breath. Can occur when she's sitting down or with activity. Also occurs when she lays down at night. Does not occur all the time. Does not occur every time she lays down. No PND. She does note some occasional left mid rib discomfort that radiates around to the left lateral lower chest that is is sharp in nature and not associated with exertion. This is been going on for a long time. She notes no personal history of cardiac issues. No PND. No edema. Does note a family history of MI in a number of uncles.  She has chronic abdominal pain. She notes this got better though over the last several weeks has been recurring. Notes it is a sharp discomfort that starts just above her umbilicus and radiates over to her right upper quadrant. She has been doing exercises for this and this is beneficial. She has a bowel movement daily. No nausea, vomiting, or diarrhea. She has had a cholecystectomy.  Patient additionally asks about screening carotid Dopplers, screening for AAA, and screening for peripheral artery disease.   PMH: Former smoker.   ROS see history of present illness  Objective  Physical Exam Vitals:   01/15/17 1008  BP: 120/68  Pulse: 69  Temp: 97.8 F (36.6 C)    BP Readings from Last 3 Encounters:  01/15/17 120/68  01/01/17 131/61  12/21/16 129/69   Wt Readings from Last 3 Encounters:  01/15/17 178 lb (80.7 kg)  01/01/17 177 lb  (80.3 kg)  12/21/16 175 lb (79.4 kg)    Physical Exam  Constitutional: No distress.  HENT:  Head: Normocephalic and atraumatic.  Cardiovascular: Normal rate, regular rhythm and normal heart sounds.   No carotid bruits, 2+ DP and PT pulses bilaterally, bilateral feet are warm and well-perfused  Pulmonary/Chest: Effort normal and breath sounds normal.  Abdominal: Soft. Bowel sounds are normal. She exhibits no distension. There is tenderness (mild epigastric tenderness). There is no rebound and no guarding.  Musculoskeletal: She exhibits no edema.  Neurological: She is alert. Gait normal.  Skin: Skin is warm and dry. She is not diaphoretic.  Psychiatric:  Mood depressed, affect flat though improved from previously   EKG: Sinus bradycardia, rate 58, RSR prime in V1, no ischemic changes noted  Assessment/Plan: Please see individual problem list.  Chronic abdominal pain Continues to have issues with this. We will check lab work as outlined below. Could consider GI evaluation if not improving. Prior CT scan relatively unremarkable.  Obesity Advised continued diet and exercise. Discussed that I would not be prescribing any medication for weight loss until we figure out why she is getting short of breath. She voiced understanding.  Anxiety and depression Seems to be better to some degree. Discussed continuing to see her psychiatrist and therapist.  Shortness of breath Patient's report of shortness of breath does not have any specific aspects to indicate a specific cause. No wheezing with this. It is not specifically exertional or specifically orthopnea.  EKG today is reassuring. We are going to check some lab work as outlined below. Potentially could be an anginal equivalent or related to CHF given family history and patient does have several risk factors. Potentially could be deconditioning or related to her obesity. We will refer to cardiology. She's given return precautions.  Discussed the  aforementioned screening tests with the patient. Discussed that there are no specific indications or evidence to prove that the screening tests would be beneficial for her. No carotid bruits on exam. She has good pulses in her feet. Prior CT scan of her abdomen and pelvis makes no mention of aneurysm.  Orders Placed This Encounter  Procedures  . CBC  . TSH  . Comp Met (CMET)  . Lipase  . Ambulatory referral to Cardiology    Referral Priority:   Routine    Referral Type:   Consultation    Referral Reason:   Specialty Services Required    Requested Specialty:   Cardiology    Number of Visits Requested:   1  . EKG 12-Lead    Nicole Rumps, MD [place]

## 2017-01-15 NOTE — Assessment & Plan Note (Addendum)
Continues to have issues with this. We will check lab work as outlined below. Could consider GI evaluation if not improving. Prior CT scan relatively unremarkable.

## 2017-01-15 NOTE — Patient Instructions (Addendum)
Nice to see you. We are going to check some lab work and contact you with the results. Given your shortness of breath associated we'll refer you to cardiology to ensure that there is no cardiac cause for this. If you develop persistent shortness of breath, chest pain, abdominal pain, fevers, or any new or change in symptoms please seek medical attention immediately.

## 2017-01-15 NOTE — Assessment & Plan Note (Signed)
Seems to be better to some degree. Discussed continuing to see her psychiatrist and therapist.

## 2017-01-16 ENCOUNTER — Telehealth: Payer: Self-pay

## 2017-01-16 DIAGNOSIS — R7989 Other specified abnormal findings of blood chemistry: Secondary | ICD-10-CM

## 2017-01-16 DIAGNOSIS — R945 Abnormal results of liver function studies: Principal | ICD-10-CM

## 2017-01-16 LAB — LIPASE: LIPASE: 13 U/L (ref 7–60)

## 2017-01-16 NOTE — Telephone Encounter (Signed)
Left message to return call 

## 2017-01-16 NOTE — Telephone Encounter (Signed)
Patient requested lab/test results Pt contact [phone]

## 2017-01-16 NOTE — Telephone Encounter (Signed)
-----   Message from Leone Haven, MD sent at 01/16/2017  1:36 PM EDT ----- Let the patient know that her liver function tests were minimally elevated. I would like to recheck these later this week. Her other lab work is unremarkable. Thanks.

## 2017-01-16 NOTE — Telephone Encounter (Signed)
Patient notified and scheduled for labs on Friday, please place order

## 2017-01-19 ENCOUNTER — Other Ambulatory Visit (INDEPENDENT_AMBULATORY_CARE_PROVIDER_SITE_OTHER): Payer: BLUE CROSS/BLUE SHIELD

## 2017-01-19 DIAGNOSIS — R945 Abnormal results of liver function studies: Principal | ICD-10-CM

## 2017-01-19 DIAGNOSIS — R7989 Other specified abnormal findings of blood chemistry: Secondary | ICD-10-CM | POA: Diagnosis not present

## 2017-01-19 LAB — HEPATIC FUNCTION PANEL
ALK PHOS: 74 U/L (ref 39–117)
ALT: 45 U/L — ABNORMAL HIGH (ref 0–35)
AST: 31 U/L (ref 0–37)
Albumin: 4.3 g/dL (ref 3.5–5.2)
BILIRUBIN TOTAL: 0.3 mg/dL (ref 0.2–1.2)
Bilirubin, Direct: 0.1 mg/dL (ref 0.0–0.3)
Total Protein: 6.7 g/dL (ref 6.0–8.3)

## 2017-01-22 ENCOUNTER — Ambulatory Visit
Admission: RE | Admit: 2017-01-22 | Discharge: 2017-01-22 | Disposition: A | Discharge status: Home / Self Care | Payer: BLUE CROSS/BLUE SHIELD | Source: Ambulatory Visit | Attending: Pain Medicine | Admitting: Pain Medicine

## 2017-01-22 ENCOUNTER — Ambulatory Visit: Admission: RE | Admit: 2017-01-22 | Payer: BLUE CROSS/BLUE SHIELD | Source: Ambulatory Visit

## 2017-01-22 ENCOUNTER — Telehealth: Payer: Self-pay | Admitting: Family Medicine

## 2017-01-22 ENCOUNTER — Ambulatory Visit (HOSPITAL_BASED_OUTPATIENT_CLINIC_OR_DEPARTMENT_OTHER): Payer: BLUE CROSS/BLUE SHIELD | Admitting: Pain Medicine

## 2017-01-22 ENCOUNTER — Encounter: Payer: Self-pay | Admitting: Pain Medicine

## 2017-01-22 VITALS — BP 121/59 | HR 72 | Temp 98.0°F | Resp 16 | Ht 64.0 in | Wt 178.0 lb

## 2017-01-22 DIAGNOSIS — Z888 Allergy status to other drugs, medicaments and biological substances status: Secondary | ICD-10-CM | POA: Diagnosis not present

## 2017-01-22 DIAGNOSIS — Z9049 Acquired absence of other specified parts of digestive tract: Secondary | ICD-10-CM | POA: Diagnosis not present

## 2017-01-22 DIAGNOSIS — E669 Obesity, unspecified: Secondary | ICD-10-CM | POA: Diagnosis not present

## 2017-01-22 DIAGNOSIS — M25511 Pain in right shoulder: Secondary | ICD-10-CM

## 2017-01-22 DIAGNOSIS — F419 Anxiety disorder, unspecified: Secondary | ICD-10-CM | POA: Diagnosis not present

## 2017-01-22 DIAGNOSIS — M797 Fibromyalgia: Secondary | ICD-10-CM | POA: Diagnosis not present

## 2017-01-22 DIAGNOSIS — M25559 Pain in unspecified hip: Secondary | ICD-10-CM | POA: Insufficient documentation

## 2017-01-22 DIAGNOSIS — M549 Dorsalgia, unspecified: Secondary | ICD-10-CM | POA: Diagnosis not present

## 2017-01-22 DIAGNOSIS — M542 Cervicalgia: Secondary | ICD-10-CM

## 2017-01-22 DIAGNOSIS — M1610 Unilateral primary osteoarthritis, unspecified hip: Secondary | ICD-10-CM | POA: Diagnosis not present

## 2017-01-22 DIAGNOSIS — Z9889 Other specified postprocedural states: Secondary | ICD-10-CM | POA: Insufficient documentation

## 2017-01-22 DIAGNOSIS — F329 Major depressive disorder, single episode, unspecified: Secondary | ICD-10-CM | POA: Diagnosis not present

## 2017-01-22 DIAGNOSIS — M160 Bilateral primary osteoarthritis of hip: Secondary | ICD-10-CM

## 2017-01-22 DIAGNOSIS — M25512 Pain in left shoulder: Secondary | ICD-10-CM | POA: Insufficient documentation

## 2017-01-22 DIAGNOSIS — R945 Abnormal results of liver function studies: Principal | ICD-10-CM

## 2017-01-22 DIAGNOSIS — R7982 Elevated C-reactive protein (CRP): Secondary | ICD-10-CM | POA: Diagnosis not present

## 2017-01-22 DIAGNOSIS — G8929 Other chronic pain: Secondary | ICD-10-CM

## 2017-01-22 DIAGNOSIS — Z79891 Long term (current) use of opiate analgesic: Secondary | ICD-10-CM | POA: Diagnosis not present

## 2017-01-22 DIAGNOSIS — Z79899 Other long term (current) drug therapy: Secondary | ICD-10-CM | POA: Insufficient documentation

## 2017-01-22 DIAGNOSIS — Z87891 Personal history of nicotine dependence: Secondary | ICD-10-CM | POA: Diagnosis not present

## 2017-01-22 DIAGNOSIS — M79605 Pain in left leg: Secondary | ICD-10-CM | POA: Insufficient documentation

## 2017-01-22 DIAGNOSIS — E7801 Familial hypercholesterolemia: Secondary | ICD-10-CM | POA: Diagnosis not present

## 2017-01-22 DIAGNOSIS — R0602 Shortness of breath: Secondary | ICD-10-CM | POA: Diagnosis not present

## 2017-01-22 DIAGNOSIS — M25561 Pain in right knee: Secondary | ICD-10-CM | POA: Insufficient documentation

## 2017-01-22 DIAGNOSIS — G894 Chronic pain syndrome: Secondary | ICD-10-CM | POA: Insufficient documentation

## 2017-01-22 DIAGNOSIS — R109 Unspecified abdominal pain: Secondary | ICD-10-CM | POA: Insufficient documentation

## 2017-01-22 DIAGNOSIS — R7989 Other specified abnormal findings of blood chemistry: Secondary | ICD-10-CM

## 2017-01-22 DIAGNOSIS — R079 Chest pain, unspecified: Secondary | ICD-10-CM | POA: Insufficient documentation

## 2017-01-22 DIAGNOSIS — F1190 Opioid use, unspecified, uncomplicated: Secondary | ICD-10-CM

## 2017-01-22 DIAGNOSIS — M8580 Other specified disorders of bone density and structure, unspecified site: Secondary | ICD-10-CM | POA: Diagnosis not present

## 2017-01-22 MED ORDER — OXYCODONE HCL 5 MG PO TABS: 5.0000 mg | ORAL_TABLET | Freq: Four times a day (QID) | ORAL | 0 refills | Status: DC | PRN

## 2017-01-22 NOTE — Progress Notes (Signed)
Safety precautions to be maintained throughout the outpatient stay will include: orient to surroundings, keep bed in low position, maintain call bell within reach at all times, provide assistance with transfer out of bed and ambulation.  

## 2017-01-22 NOTE — Telephone Encounter (Signed)
Pt called back returning your call. Thank you!  Call pt @ [phone]

## 2017-01-22 NOTE — Progress Notes (Signed)
Patient's Name: Nicole Walter  MRN: [id number]  Referring Provider: Leone Haven, MD  DOB: [birth year]  PCP: Leone Haven, MD  DOS: 01/22/2017  Note by: Kathlen Brunswick. Dossie Arbour, MD  Service setting: Ambulatory outpatient  Specialty: Interventional Pain Management  Location: [facility] (AMB) Pain Management Facility    Patient type: Established   Primary Reason(s) for Visit: Encounter for post-procedure evaluation of chronic illness with mild to moderate exacerbation CC: Back Pain (thorasic-right); Hip Pain (bilaterally); and Neck Pain (bilaterally- primarily right)  HPI  Nicole Walter is a 56 y.o. year old, female patient, who comes today for a post-procedure evaluation. She has Anxiety and depression; Chronic pain syndrome; Lumbar spondylosis; Combined fat and carbohydrate induced hyperlipemia; Thoracic back pain; Chronic neck pain (Location of Primary Source of Pain) (Bilateral) (L>R); Concentration deficit; Hyperlipidemia; Cervical osteoarthritis; Fibromyalgia; Osteopenia; Skin lesions; Chronic hip pain (Bilateral) (L>R); Bursitis of right shoulder; Chronic Greater trochanteric bursitis (Bilateral) (L>R); Long term current use of opiate analgesic; Long term prescription opiate use; Opiate use; Chronic upper back pain (Location of Secondary source of pain) (Bilateral) (L>R); Chronic shoulder pain (Location of Tertiary source of pain) (Bilateral) (L>R); Chronic low back pain (Bilateral) (L>R); Chronic knee pain (Right); Chronic upper extremity pain (Left); Chronic lower extremity pain (Bilateral) (L>R); Chronic abdominal pain; Elevated sedimentation rate; Elevated C-reactive protein (CRP); Obesity; Greater trochanteric bursitis (Right); Subacromial bursitis of shoulder joint (Right); Osteoarthritis of hip (Bilateral) (L>R); Thoracic radiculitis (R>L); Thoracic spondylosis; and Shortness of breath on her problem list. Her primarily concern today is the Back Pain (thorasic-right); Hip Pain (bilaterally); and  Neck Pain (bilaterally- primarily right)  Pain Assessment: Self-Reported Pain Score: 2 /10             Reported level is compatible with observation.       Pain Type: Chronic pain Pain Location: Back Pain Orientation: Mid, Upper Pain Descriptors / Indicators: Constant, Aching, Burning, Discomfort Pain Frequency: Constant  Nicole Walter comes in today for post-procedure evaluation after the treatment done on 01/01/2017. The patient returns today for follow-up after a thoracic epidural steroid injection under fluoroscopic guidance and IV sedation. She seems to have gotten significant benefit on the left side, but not as much on the right side. Review of the thoracic MRI shows worst pathology on the right side. In addition to this, the patient was recently found to have elevated liver enzymes and she is here to see if there is anything that we can do to improve this. Today we will go ahead and discontinue the hydrocodone/APAP and switch her to oxycodone IR, without the acetaminophen. That should decrease the stress on the liver. She is titrating her Neurontin up and she was wondering if the Neurontin was contributing to the liver problem. I looked it up and then Neurontin does not get metabolized in the liver and therefore he should not affected significantly.  In terms of her pain, her main concern today is day hip pain. Previously we had done some diagnostic intra-articular hip joint injections which did seem to help, but because the pain has returned on her right side, she is concerned about this. Today we will order an MRI of the hip to determine if there is anything that we need to consult an orthopedic surgeon for. In terms of her worst pain today, she indicates that it is her right shoulder. At this point, there is a question asked to whether the pain is in the shoulder itself or coming from the cervical structures. An MRI  of the cervical spine done in April 2017 did show progression of the C5-6 Cervical  spondylosis, suggesting the possibility that some of the shoulder pain may be coming from that area. She certainly has pain over the rhomboid muscles which would be color by the C5 nerve root. However, a lot of her pain is in the shoulder joint and therefore I will bring her back for a diagnostic suprascapular nerve block. If this provides her with good relief of the pain, may be able to do a radiofrequency in order to obtain longer lasting benefit.  Further details on both, my assessment(s), as well as the proposed treatment plan, please see below.  Post-Procedure Assessment  01/01/2017 Procedure: Diagnostic T4-5 midline epidural steroid injection under fluoroscopic guidance and IV sedation Post-procedure pain score: 0/10 (100% relief) Influential Factors: BMI: 30.55 kg/m Intra-procedural challenges: None observed Assessment challenges: None detected         Post-procedural side-effects, adverse reactions, or complications: None reported Reported issues: None  Sedation: Sedation provided. When no sedatives are used, the analgesic levels obtained are directly associated to the effectiveness of the local anesthetics. However, when sedation is provided, the level of analgesia obtained during the initial 1 hour following the intervention, is believed to be the result of a combination of factors. These factors may include, but are not limited to: 1. The effectiveness of the local anesthetics used. 2. The effects of the analgesic(s) and/or anxiolytic(s) used. 3. The degree of discomfort experienced by the patient at the time of the procedure. 4. The patients ability and reliability in recalling and recording the events. 5. The presence and influence of possible secondary gains and/or psychosocial factors. Reported result: Relief experienced during the 1st hour after the procedure: 100 % (Ultra-Short Term Relief) Interpretative annotation: Analgesia during this period is likely to be Local Anesthetic  and/or IV Sedative (Analgesic/Anxiolitic) related.          Effects of local anesthetic: The analgesic effects attained during this period are directly associated to the localized infiltration of local anesthetics and therefore cary significant diagnostic value as to the etiological location, or anatomical origin, of the pain. Expected duration of relief is directly dependent on the pharmacodynamics of the local anesthetic used. Long-acting (4-6 hours) anesthetics used.  Reported result: Relief during the next 4 to 6 hour after the procedure: 100 % (Short-Term Relief) Interpretative annotation: Complete relief would suggest area to be the source of the pain.          Long-term benefit: Defined as the period of time past the expected duration of local anesthetics. With the possible exception of prolonged sympathetic blockade from the local anesthetics, benefits during this period are typically attributed to, or associated with, other factors such as analgesic sensory neuropraxia, antiinflammatory effects, or beneficial biochemical changes provided by agents other than the local anesthetics Reported result: Extended relief following procedure: 80 % (left side better- pain on right per pt. 2/10) (Long-Term Relief) Interpretative annotation: Good relief. This could suggest inflammation to be a significant component in the etiology to the pain.          Current benefits: Defined as persistent relief that continues at this point in time.   Reported results: Treated area: <75 % The patient indicates that the left side seems to be much better, but no benefit on the right side. Review of the MRI would suggest worst pathology on the right side. We will consider repeating the procedure on the right side at a later  time. Interpretative annotation: Ongoing benefits would suggest effective therapeutic approach  Interpretation: Results would suggest a successful diagnostic intervention.          Laboratory  Chemistry  Inflammation Markers Lab Results  Component Value Date   CRP 2.1 (H) 07/20/2016   ESRSEDRATE 37 (H) 07/20/2016   (CRP: Acute Phase) (ESR: Chronic Phase) Renal Function Markers Lab Results  Component Value Date   BUN 13 01/15/2017   CREATININE 0.89 01/15/2017   GFRAA >60 07/20/2016   GFRNONAA >60 07/20/2016   Hepatic Function Markers Lab Results  Component Value Date   AST 31 01/19/2017   ALT 45 (H) 01/19/2017   ALBUMIN 4.3 01/19/2017   ALKPHOS 74 01/19/2017   Electrolytes Lab Results  Component Value Date   NA 141 01/15/2017   K 4.3 01/15/2017   CL 103 01/15/2017   CALCIUM 9.3 01/15/2017   MG 2.1 07/20/2016   Neuropathy Markers Lab Results  Component Value Date   VITAMINB12 987 (H) 07/20/2016   Bone Pathology Markers Lab Results  Component Value Date   ALKPHOS 74 01/19/2017   25OHVITD1 58 07/20/2016   25OHVITD2 <1.0 07/20/2016   25OHVITD3 58 07/20/2016   CALCIUM 9.3 01/15/2017   Coagulation Parameters Lab Results  Component Value Date   PLT 300 01/15/2017   Cardiovascular Markers Lab Results  Component Value Date   HGB 12.4 01/15/2017   HCT 37.8 01/15/2017   Note: Lab results reviewed.  Recent Diagnostic Imaging Review  Dg C-arm 1-60 Min-no Report  Result Date: 01/01/2017 Fluoroscopy was utilized by the requesting physician.  No radiographic interpretation.   Note: Imaging results reviewed.          Meds  The patient has a current medication list which includes the following prescription(s): vitamin d, fluocinonide ointment, fluticasone, gabapentin, hydroxyzine, multi-vitamins, oxycodone, oxycodone, rabeprazole, simvastatin, sucralfate, tizanidine, trazodone, and trintellix.  Current Outpatient Prescriptions on File Prior to Visit  Medication Sig  . Cholecalciferol (VITAMIN D) 2000 units tablet Take 4,000 Units by mouth daily.  . fluocinonide ointment (LIDEX) 0.05 % APPLY TOPICALLY TWICE A DAY TO ITCHY AREAS ONLY. as needed  .  fluticasone (FLONASE) 50 MCG/ACT nasal spray Place 2 sprays into both nostrils daily.   Marland Kitchen gabapentin (NEURONTIN) 300 MG capsule Take 1-3 capsules (300-900 mg total) by mouth every 8 (eight) hours. Follow titration schedule (Patient taking differently: Take 300-900 mg by mouth every 8 (eight) hours. Follow titration schedule)  . hydrOXYzine (VISTARIL) 25 MG capsule Take 25 mg by mouth 2 (two) times daily as needed.  . Multiple Vitamin (MULTI-VITAMINS) TABS Take 1 tablet by mouth daily.   . RABEprazole (ACIPHEX) 20 MG tablet Take 20 mg by mouth as needed.   . simvastatin (ZOCOR) 20 MG tablet take 1 tablet by mouth at bedtime  . sucralfate (CARAFATE) 1 g tablet take 1 tablet by mouth three times a day if needed for abdominal pain  . tiZANidine (ZANAFLEX) 2 MG tablet 1 tablet 2-3 times a day only as needed for severe muscles spasm.  . traZODone (DESYREL) 100 MG tablet take 1 tablet by mouth at bedtime if needed  . TRINTELLIX 10 MG TABS 1 tablet daily.    No current facility-administered medications on file prior to visit.    ROS  Constitutional: Denies any fever or chills Gastrointestinal: No reported hemesis, hematochezia, vomiting, or acute GI distress Musculoskeletal: Denies any acute onset joint swelling, redness, loss of ROM, or weakness Neurological: No reported episodes of acute onset apraxia, aphasia, dysarthria,  agnosia, amnesia, paralysis, loss of coordination, or loss of consciousness  Allergies  Ms. Benninger is allergic to other and food.  PFSH  Drug: Ms. Deatley  reports that she does not use drugs. Alcohol:  reports that she does not drink alcohol. Tobacco:  reports that she quit smoking about 18 months ago. She started smoking about 42 years ago. She has never used smokeless tobacco. Medical:  has a past medical history of Abnormal liver enzymes (02/08/2016); Allergy; Anxiety; Chest pain (07/05/2016); Chronic pain; Depression; DJD (degenerative joint disease); Fibromyalgia; High  cholesterol; and Rash of back (01/28/2016). Family: family history includes Arthritis in her father, paternal grandfather, and paternal grandmother; Cancer in her brother and father; Dementia in her sister; Diabetes in her sister; Drug abuse in her brother, brother, and sister; Heart disease in her mother; Post-traumatic stress disorder in her brother; Stroke in her mother.  Past Surgical History:  Procedure Laterality Date  . ANKLE SURGERY     x 2  . BREAST BIOPSY Left 1997  . CHOLECYSTECTOMY    . COLONOSCOPY    . COLONOSCOPY WITH PROPOFOL N/A 02/22/2016   Procedure: COLONOSCOPY WITH PROPOFOL;  Surgeon: Lollie Sails, MD;  Location: [facility] ENDOSCOPY;  Service: Endoscopy;  Laterality: N/A;  . ESOPHAGOGASTRODUODENOSCOPY (EGD) WITH PROPOFOL N/A 02/22/2016   Procedure: ESOPHAGOGASTRODUODENOSCOPY (EGD) WITH PROPOFOL;  Surgeon: Lollie Sails, MD;  Location: [facility] ENDOSCOPY;  Service: Endoscopy;  Laterality: N/A;  . NASAL SINUS SURGERY    . TONSILLECTOMY    . TUBAL LIGATION     Constitutional Exam  General appearance: Well nourished, well developed, and well hydrated. In no apparent acute distress Vitals:   01/22/17 1418  BP: (!) 121/59  Pulse: 72  Resp: 16  Temp: 98 F (36.7 C)  SpO2: 98%  Weight: 178 lb (80.7 kg)  Height: '5\' 4"'  (1.626 m)   BMI Assessment: Estimated body mass index is 30.55 kg/m as calculated from the following:   Height as of this encounter: '5\' 4"'  (1.626 m).   Weight as of this encounter: 178 lb (80.7 kg).  BMI interpretation table: BMI level Category Range association with higher incidence of chronic pain  <18 kg/m2 Underweight   18.5-24.9 kg/m2 Ideal body weight   25-29.9 kg/m2 Overweight Increased incidence by 20%  30-34.9 kg/m2 Obese (Class I) Increased incidence by 68%  35-39.9 kg/m2 Severe obesity (Class II) Increased incidence by 136%  >40 kg/m2 Extreme obesity (Class III) Increased incidence by 254%   BMI Readings from Last 4 Encounters:  01/22/17  30.55 kg/m  01/15/17 30.55 kg/m  01/01/17 30.38 kg/m  12/21/16 30.04 kg/m   Wt Readings from Last 4 Encounters:  01/22/17 178 lb (80.7 kg)  01/15/17 178 lb (80.7 kg)  01/01/17 177 lb (80.3 kg)  12/21/16 175 lb (79.4 kg)  Psych/Mental status: Alert, oriented x 3 (person, place, & time)       Eyes: PERLA Respiratory: No evidence of acute respiratory distress  Cervical Spine Exam  Inspection: No masses, redness, or swelling Alignment: Symmetrical Functional ROM: Unrestricted ROM Stability: No instability detected Muscle strength & Tone: Functionally intact Sensory: Unimpaired Palpation: Non-contributory  Upper Extremity (UE) Exam    Side: Right upper extremity  Side: Left upper extremity  Inspection: No masses, redness, swelling, or asymmetry. No contractures  Inspection: No masses, redness, swelling, or asymmetry. No contractures  Functional ROM: Decreased ROM for shoulder  Functional ROM: Unrestricted ROM          Muscle strength & Tone: Functionally  intact  Muscle strength & Tone: Functionally intact  Sensory: Unimpaired  Sensory: Unimpaired  Palpation: Euthermic  Palpation: Euthermic  Specialized Test(s): Deferred         Specialized Test(s): Deferred          Thoracic Spine Exam  Inspection: No masses, redness, or swelling Alignment: Symmetrical Functional ROM: Unrestricted ROM Stability: No instability detected Sensory: Unimpaired Muscle strength & Tone: Functionally intact Palpation: Non-contributory  Lumbar Spine Exam  Inspection: No masses, redness, or swelling Alignment: Symmetrical Functional ROM: Unrestricted ROM Stability: No instability detected Muscle strength & Tone: Functionally intact Sensory: Unimpaired Palpation: Non-contributory Provocative Tests: Lumbar Hyperextension and rotation test: evaluation deferred today       Patrick's Maneuver: evaluation deferred today              Gait & Posture Assessment  Ambulation: Unassisted Gait:  Relatively normal for age and body habitus Posture: WNL   Lower Extremity Exam    Side: Right lower extremity  Side: Left lower extremity  Inspection: No masses, redness, swelling, or asymmetry. No contractures  Inspection: No masses, redness, swelling, or asymmetry. No contractures  Functional ROM: Unrestricted ROM          Functional ROM: Unrestricted ROM          Muscle strength & Tone: Functionally intact  Muscle strength & Tone: Functionally intact  Sensory: Unimpaired  Sensory: Unimpaired  Palpation: No palpable anomalies  Palpation: No palpable anomalies   Assessment  Primary Diagnosis & Pertinent Problem List: The primary encounter diagnosis was Chronic pain syndrome. Diagnoses of Chronic neck pain (Location of Primary Source of Pain) (Bilateral) (L>R), Chronic upper back pain (Location of Secondary source of pain) (Bilateral) (L>R), Chronic shoulder pain (Location of Tertiary source of pain) (Bilateral) (L>R), Long term prescription opiate use, Opiate use, Chronic hip pain, unspecified laterality, and Osteoarthritis of hip (Bilateral) (L>R) were also pertinent to this visit.  Status Diagnosis  Controlled Controlled Not improving 1. Chronic pain syndrome   2. Chronic neck pain (Location of Primary Source of Pain) (Bilateral) (L>R)   3. Chronic upper back pain (Location of Secondary source of pain) (Bilateral) (L>R)   4. Chronic shoulder pain (Location of Tertiary source of pain) (Bilateral) (L>R)   5. Long term prescription opiate use   6. Opiate use   7. Chronic hip pain, unspecified laterality   8. Osteoarthritis of hip (Bilateral) (L>R)      Plan of Care  Pharmacotherapy (Medications Ordered): Meds ordered this encounter  Medications  . oxyCODONE (OXY IR/ROXICODONE) 5 MG immediate release tablet    Sig: Take 1 tablet (5 mg total) by mouth every 6 (six) hours as needed for severe pain.    Dispense:  120 tablet    Refill:  0    Do not place this medication, or any other  prescription from our practice, on "Automatic Refill". Patient may have prescription filled one day early if pharmacy is closed on scheduled refill date. Do not fill until: 02/11/17 To last until: 03/13/17  . oxyCODONE (OXY IR/ROXICODONE) 5 MG immediate release tablet    Sig: Take 1 tablet (5 mg total) by mouth every 6 (six) hours as needed for severe pain.    Dispense:  120 tablet    Refill:  0    Do not place this medication, or any other prescription from our practice, on "Automatic Refill". Patient may have prescription filled one day early if pharmacy is closed on scheduled refill date. Do not fill until:  03/13/17 To last until: 04/12/17   New Prescriptions   OXYCODONE (OXY IR/ROXICODONE) 5 MG IMMEDIATE RELEASE TABLET    Take 1 tablet (5 mg total) by mouth every 6 (six) hours as needed for severe pain.   OXYCODONE (OXY IR/ROXICODONE) 5 MG IMMEDIATE RELEASE TABLET    Take 1 tablet (5 mg total) by mouth every 6 (six) hours as needed for severe pain.   Medications administered today: Ms. Tahir had no medications administered during this visit. Lab-work, procedure(s), and/or referral(s): Orders Placed This Encounter  Procedures  . SUPRASCAPULAR NERVE BLOCK  . MR HIP RIGHT WO CONTRAST  . DG Shoulder Right   Imaging and/or referral(s): MR HIP RIGHT WO CONTRAST  Interventional therapies: Planned, scheduled, and/or pending:   Diagnostic right suprascapular nerve block under fluoroscopic guidance and IV sedation    Considering:   Diagnostic right-sided T3-4 thoracic epidural steroid injection #1 Diagnostic right-sided T4-5 thoracic epidural steroid injection #2 Diagnostic bilateral cervical facet block Possible bilateral cervical facet radiofrequency ablation Diagnostic left-sided cervical epidural steroid injection Diagnostic bilateral intra-articular shoulder joint injection Diagnostic bilateral suprascapular nerve block Possible bilateral suprascapular nerve radiofrequency  ablation Diagnostic bilateral lumbar facet block Possible bilateral lumbar facet radiofrequency ablation  Diagnostic bilateral intra-articular hip joint injection  Diagnostic bilateral femoral and obturator tickler branch blocks  Possible bilateral hip joint radiofrequency ablation  Diagnostic right intra-articular knee joint injection Possible series of 5, right-sided, intra-articular knee joint injections with Hyalgan.  Possible right sided genicular nerve block Possible right sided genicular nerve radiofrequency ablation Possible left-sided lumbar epidural steroid injection    Palliative PRN treatment(s):   None at this time.    Provider-requested follow-up: Return in about 2 months (around 03/24/2017) for (MD) Med-Mgmt, in addition, procedure (ASAP).  Future Appointments Date Time Provider [place]  01/24/2017 1:45 PM Wende Bushy, MD [facility]  01/31/2017 8:45 AM Milinda Pointer, MD [facility] None  04/18/2017 10:00 AM Leone Haven, MD [facility] None   Primary Care Physician: Leone Haven, MD Location: [facility] Note by: Kathlen Brunswick Dossie Arbour, M.D, DABA, DABAPM, DABPM, DABIPP, FIPP Date: 01/22/2017; Time: 4:55 PM  Pain Score Disclaimer: We use the NRS-11 scale. This is a self-reported, subjective measurement of pain severity with only modest accuracy. It is used primarily to identify changes within a particular patient. It must be understood that outpatient pain scales are significantly less accurate that those used for research, where they can be applied under ideal controlled circumstances with minimal exposure to variables. In reality, the score is likely to be a combination of pain intensity and pain affect, where pain affect describes the degree of emotional arousal or changes in action readiness caused by the sensory experience of pain. Factors such as social and work situation, setting, emotional state, anxiety levels,  expectation, and prior pain experience may influence pain perception and show large inter-individual differences that may also be affected by time variables.  Patient instructions provided during this appointment: Patient Instructions  You have been scheduled an xray and mri.  You can go to the medical mall for your xray at any time.  Mri will contact you for your mri     Preparing for Procedure with Sedation Instructions: . Oral Intake: Do not eat or drink anything for at least 8 hours prior to your procedure. . Transportation: Public transportation is not allowed. Bring an adult driver. The driver must be physically present in our waiting room before any procedure can be started. Marland Kitchen Physical  Assistance: Bring an adult capable of physically assisting you, in the event you need help. . Blood Pressure Medicine: Take your blood pressure medicine with a sip of water the morning of the procedure. . Insulin: Take only  of your normal insulin dose. . Preventing infections: Shower with an antibacterial soap the morning of your procedure. . Build-up your immune system: Take 1000 mg of Vitamin C with every meal (3 times a day) the day prior to your procedure. . Pregnancy: If you are pregnant, call and cancel the procedure. . Sickness: If you have a cold, fever, or any active infections, call and cancel the procedure. . Arrival: You must be in the facility at least 30 minutes prior to your scheduled procedure. . Children: Do not bring children with you. . Dress appropriately: Bring dark clothing that you would not mind if they get stained. . Valuables: Do not bring any jewelry or valuables. Procedure appointments are reserved for interventional treatments only. Marland Kitchen No Prescription Refills. . No medication changes will be discussed during procedure appointments. . No disability issues will be discussed.

## 2017-01-22 NOTE — Telephone Encounter (Signed)
Reviewed labs with patient, she would like to have the Ultrasound done, please advise, thanks

## 2017-01-22 NOTE — Patient Instructions (Signed)
You have been scheduled an xray and mri.  You can go to the medical mall for your xray at any time.  Mri will contact you for your mri     Preparing for Procedure with Sedation Instructions: . Oral Intake: Do not eat or drink anything for at least 8 hours prior to your procedure. . Transportation: Public transportation is not allowed. Bring an adult driver. The driver must be physically present in our waiting room before any procedure can be started. Marland Kitchen Physical Assistance: Bring an adult capable of physically assisting you, in the event you need help. . Blood Pressure Medicine: Take your blood pressure medicine with a sip of water the morning of the procedure. . Insulin: Take only  of your normal insulin dose. . Preventing infections: Shower with an antibacterial soap the morning of your procedure. . Build-up your immune system: Take 1000 mg of Vitamin C with every meal (3 times a day) the day prior to your procedure. . Pregnancy: If you are pregnant, call and cancel the procedure. . Sickness: If you have a cold, fever, or any active infections, call and cancel the procedure. . Arrival: You must be in the facility at least 30 minutes prior to your scheduled procedure. . Children: Do not bring children with you. . Dress appropriately: Bring dark clothing that you would not mind if they get stained. . Valuables: Do not bring any jewelry or valuables. Procedure appointments are reserved for interventional treatments only. Marland Kitchen No Prescription Refills. . No medication changes will be discussed during procedure appointments. . No disability issues will be discussed.

## 2017-01-23 NOTE — Telephone Encounter (Signed)
Order placed

## 2017-01-24 ENCOUNTER — Ambulatory Visit (INDEPENDENT_AMBULATORY_CARE_PROVIDER_SITE_OTHER): Payer: BLUE CROSS/BLUE SHIELD | Admitting: Cardiology

## 2017-01-24 ENCOUNTER — Encounter: Payer: Self-pay | Admitting: Cardiology

## 2017-01-24 VITALS — BP 100/60 | HR 59 | Ht 64.0 in | Wt 179.5 lb

## 2017-01-24 DIAGNOSIS — R0602 Shortness of breath: Secondary | ICD-10-CM

## 2017-01-24 DIAGNOSIS — R079 Chest pain, unspecified: Secondary | ICD-10-CM | POA: Diagnosis not present

## 2017-01-24 NOTE — Patient Instructions (Addendum)
Testing/Procedures: Your physician has requested that you have an echocardiogram. Echocardiography is a painless test that uses sound waves to create images of your heart. It provides your doctor with information about the size and shape of your heart and how well your heart's chambers and valves are working. This procedure takes approximately one hour. There are no restrictions for this procedure.  [place]  Your caregiver has ordered a Stress Test with nuclear imaging. The purpose of this test is to evaluate the blood supply to your heart muscle. This procedure is referred to as a "Non-Invasive Stress Test." This is because other than having an IV started in your vein, nothing is inserted or "invades" your body. Cardiac stress tests are done to find areas of poor blood flow to the heart by determining the extent of coronary artery disease (CAD). Some patients exercise on a treadmill, which naturally increases the blood flow to your heart, while others who are  unable to walk on a treadmill due to physical limitations have a pharmacologic/chemical stress agent called Lexiscan . This medicine will mimic walking on a treadmill by temporarily increasing your coronary blood flow.   Please note: these test may take anywhere between 2-4 hours to complete  PLEASE REPORT TO [place] AT THE FIRST DESK WILL DIRECT YOU WHERE TO GO  Date of Procedure:__Thursday February 01, 2017 at 09:00AM__  Arrival Time for Procedure:_Arrive at 08:45AM to register__   PLEASE NOTIFY THE OFFICE AT LEAST 24 HOURS IN ADVANCE IF YOU ARE UNABLE TO [place].  [phone] AND  PLEASE NOTIFY NUCLEAR MEDICINE AT [facility] AT LEAST 24 HOURS IN ADVANCE IF YOU ARE UNABLE TO KEEP YOUR APPOINTMENT. [phone]  How to prepare for your Myoview test:  1. Do not eat or drink after midnight 2. No caffeine for 24 hours prior to test 3. No smoking 24 hours prior to test (includes vape and  electric cigs). 4. Your medication may be taken with water.  If your doctor stopped a medication because of this test, do not take that medication. 5. Ladies, please do not wear dresses.  Skirts or pants are appropriate. Please wear a short sleeve shirt. 6. No perfume, cologne or lotion. 7. Wear comfortable walking shoes. No heels!    Follow-Up: Your physician recommends that you schedule a follow-up appointment as needed. We will call you with results and if needed schedule follow up at that time.    It was a pleasure seeing you today here in the office. Please do not hesitate to give [place] a call back if you have any further questions. [place], BSN    Echocardiogram An echocardiogram, or echocardiography, uses sound waves (ultrasound) to produce an image of your heart. The echocardiogram is simple, painless, obtained within a short period of time, and offers valuable information to your health care provider. The images from an echocardiogram can provide information such as:  Evidence of coronary artery disease (CAD).  Heart size.  Heart muscle function.  Heart valve function.  Aneurysm detection.  Evidence of a past heart attack.  Fluid buildup around the heart.  Heart muscle thickening.  Assess heart valve function. Tell a health care provider about:  Any allergies you have.  All medicines you are taking, including vitamins, herbs, eye drops, creams, and over-the-counter medicines.  Any problems you or family members have had with anesthetic medicines.  Any blood disorders you have.  Any surgeries you have had.  Any medical conditions you have.  Whether you are pregnant or may be pregnant. What happens before the procedure? No special preparation is needed. Eat and drink normally. What happens during the procedure?  In order to produce an image of your heart, gel will be applied to your chest and a wand-like tool (transducer) will be moved  over your chest. The gel will help transmit the sound waves from the transducer. The sound waves will harmlessly bounce off your heart to allow the heart images to be captured in real-time motion. These images will then be recorded.  You may need an IV to receive a medicine that improves the quality of the pictures. What happens after the procedure? You may return to your normal schedule including diet, activities, and medicines, unless your health care provider tells you otherwise. This information is not intended to replace advice given to you by your health care provider. Make sure you discuss any questions you have with your health care provider. Document Released: 10/20/2000 Document Revised: 06/10/2016 Document Reviewed: 06/30/2013 Elsevier Interactive Patient Education  2017 [place]. Pharmacologic Stress Electrocardiogram Introduction A pharmacologic stress electrocardiogram is a heart (cardiac) test that uses nuclear imaging to evaluate the blood supply to your heart. This test may also be called a pharmacologic stress electrocardiography. Pharmacologic means that a medicine is used to increase your heart rate and blood pressure. This stress test is done to find areas of poor blood flow to the heart by determining the extent of coronary artery disease (CAD). Some people exercise on a treadmill, which naturally increases the blood flow to the heart. For those people unable to exercise on a treadmill, a medicine is used. This medicine stimulates your heart and will cause your heart to beat harder and more quickly, as if you were exercising. Pharmacologic stress tests can help determine:  The adequacy of blood flow to your heart during increased levels of activity in order to clear you for discharge home.  The extent of coronary artery blockage caused by CAD.  Your prognosis if you have suffered a heart attack.  The effectiveness of cardiac procedures done, such as an angioplasty,  which can increase the circulation in your coronary arteries.  Causes of chest pain or pressure. LET [facility] CARE PROVIDER KNOW ABOUT:  Any allergies you have.  All medicines you are taking, including vitamins, herbs, eye drops, creams, and over-the-counter medicines.  Previous problems you or members of your family have had with the use of anesthetics.  Any blood disorders you have.  Previous surgeries you have had.  Medical conditions you have.  Possibility of pregnancy, if this applies.  If you are currently breastfeeding. RISKS AND COMPLICATIONS Generally, this is a safe procedure. However, as with any procedure, complications can occur. Possible complications include:  You develop pain or pressure in the following areas:  Chest.  Jaw or neck.  Between your shoulder blades.  Radiating down your left arm.  Headache.  Dizziness or light-headedness.  Shortness of breath.  Increased or irregular heartbeat.  Low blood pressure.  Nausea or vomiting.  Flushing.  Redness going up the arm and slight pain during injection of medicine.  Heart attack (rare). BEFORE THE PROCEDURE  Avoid all forms of caffeine for 24 hours before your test or as directed by your health care provider. This includes coffee, tea (even decaffeinated tea), caffeinated sodas, chocolate, cocoa, and certain pain medicines.  Follow your health care provider's instructions regarding eating and drinking before the test.  Take your medicines as directed at regular times with water unless instructed otherwise. Exceptions may include:  If you have diabetes, ask how you are to take your insulin or pills. It is common to adjust insulin dosing the morning of the test.  If you are taking beta-blocker medicines, it is important to talk to your health care provider about these medicines well before the date of your test. Taking beta-blocker medicines may interfere with the test. In some cases, these  medicines need to be changed or stopped 24 hours or more before the test.  If you wear a nitroglycerin patch, it may need to be removed prior to the test. Ask your health care provider if the patch should be removed before the test.  If you use an inhaler for any breathing condition, bring it with you to the test.  If you are an outpatient, bring a snack so you can eat right after the stress phase of the test.  Do not smoke for 4 hours prior to the test or as directed by your health care provider.  Do not apply lotions, powders, creams, or oils on your chest prior to the test.  Wear comfortable shoes and clothing. Let your health care provider know if you were unable to complete or follow the preparations for your test. PROCEDURE  Multiple patches (electrodes) will be put on your chest. If needed, small areas of your chest may be shaved to get better contact with the electrodes. Once the electrodes are attached to your body, multiple wires will be attached to the electrodes, and your heart rate will be monitored.  An IV access will be started. A nuclear trace (isotope) is given. The isotope may be given intravenously, or it may be swallowed. Nuclear refers to several types of radioactive isotopes, and the nuclear isotope lights up the arteries so that the nuclear images are clear. The isotope is absorbed by your body. This results in low radiation exposure.  A resting nuclear image is taken to show how your heart functions at rest.  A medicine is given through the IV access.  A second scan is done about 1 hour after the medicine injection and determines how your heart functions under stress.  During this stress phase, you will be connected to an electrocardiogram machine. Your blood pressure and oxygen levels will be monitored. What to expect after the procedure  Your heart rate and blood pressure will be monitored after the test.  You may return to your normal schedule, including  diet,activities, and medicines, unless your health care provider tells you otherwise. This information is not intended to replace advice given to you by your health care provider. Make sure you discuss any questions you have with your health care provider. Document Released: 03/11/2009 Document Revised: 03/30/2016 Document Reviewed: 05/01/2016 Elsevier Interactive Patient Education  2017 [employer].

## 2017-01-24 NOTE — Progress Notes (Signed)
Cardiology Office Note   Date:  01/24/2017   ID:  Nicole Walter, DOB [birth year], MRN [id number]  Referring Doctor:  Tommi Rumps, MD   Cardiologist:   Wende Bushy, MD   Reason for consultation:  Chief Complaint  Patient presents with  . other    Referred by Dr.Sonnenberg for SOB. Pt C/O chest pains. Reviewed meds with pt verbally.      History of Present Illness: Nicole Walter is a 56 y.o. female who presents for CP. 1 year duration. Sharp pain, electric shock, few minutes at a time, non radiating, moderate intensity, on and off, random in nature  SOB with exertion, going on for years now, moderate severity, limited walking due to back pains  Father's side uncle 83 MI, uncle 15s sudden death  No edema, palpitations, syncope   ROS:  Please see the history of present illness. Aside from mentioned under HPI, all other systems are reviewed and negative.     Past Medical History:  Diagnosis Date  . Abnormal liver enzymes 02/08/2016  . Allergy   . Anxiety   . Chest pain 07/05/2016  . Chronic pain   . Depression   . DJD (degenerative joint disease)    lumbar  . Fibromyalgia   . High cholesterol   . Rash of back 01/28/2016    Past Surgical History:  Procedure Laterality Date  . ANKLE SURGERY     x 2  . BREAST BIOPSY Left 1997  . CHOLECYSTECTOMY    . COLONOSCOPY    . COLONOSCOPY WITH PROPOFOL N/A 02/22/2016   Procedure: COLONOSCOPY WITH PROPOFOL;  Surgeon: Lollie Sails, MD;  Location: [facility] ENDOSCOPY;  Service: Endoscopy;  Laterality: N/A;  . ESOPHAGOGASTRODUODENOSCOPY (EGD) WITH PROPOFOL N/A 02/22/2016   Procedure: ESOPHAGOGASTRODUODENOSCOPY (EGD) WITH PROPOFOL;  Surgeon: Lollie Sails, MD;  Location: [facility] ENDOSCOPY;  Service: Endoscopy;  Laterality: N/A;  . NASAL SINUS SURGERY    . TONSILLECTOMY    . TUBAL LIGATION       reports that she quit smoking about 18 months ago. She started smoking about 42 years ago. She has never used smokeless tobacco.  She reports that she does not drink alcohol or use drugs.  1-2ppd  family history includes Arthritis in her father, paternal grandfather, and paternal grandmother; Cancer in her brother and father; Dementia in her sister; Diabetes in her sister; Drug abuse in her brother, brother, and sister; Heart disease in her mother; Post-traumatic stress disorder in her brother; Stroke in her mother.   Outpatient Medications Prior to Visit  Medication Sig Dispense Refill  . Cholecalciferol (VITAMIN D) 2000 units tablet Take 4,000 Units by mouth daily.    . fluocinonide ointment (LIDEX) 0.05 % APPLY TOPICALLY TWICE A DAY TO ITCHY AREAS ONLY. as needed  0  . fluticasone (FLONASE) 50 MCG/ACT nasal spray Place 2 sprays into both nostrils daily.     Marland Kitchen gabapentin (NEURONTIN) 300 MG capsule Take 1-3 capsules (300-900 mg total) by mouth every 8 (eight) hours. Follow titration schedule (Patient taking differently: Take 300-900 mg by mouth every 8 (eight) hours. Follow titration schedule) 270 capsule 2  . hydrOXYzine (VISTARIL) 25 MG capsule Take 25 mg by mouth 2 (two) times daily as needed.  1  . Multiple Vitamin (MULTI-VITAMINS) TABS Take 1 tablet by mouth daily.     Derrill Memo ON 02/11/2017] oxyCODONE (OXY IR/ROXICODONE) 5 MG immediate release tablet Take 1 tablet (5 mg total) by mouth every 6 (six) hours as needed for  severe pain. 120 tablet 0  . [START ON 03/13/2017] oxyCODONE (OXY IR/ROXICODONE) 5 MG immediate release tablet Take 1 tablet (5 mg total) by mouth every 6 (six) hours as needed for severe pain. 120 tablet 0  . RABEprazole (ACIPHEX) 20 MG tablet Take 20 mg by mouth as needed.     . simvastatin (ZOCOR) 20 MG tablet take 1 tablet by mouth at bedtime 90 tablet 1  . sucralfate (CARAFATE) 1 g tablet take 1 tablet by mouth three times a day if needed for abdominal pain  0  . tiZANidine (ZANAFLEX) 2 MG tablet 1 tablet 2-3 times a day only as needed for severe muscles spasm.    . traZODone (DESYREL) 100 MG tablet  take 1 tablet by mouth at bedtime if needed  0  . TRINTELLIX 10 MG TABS 1 tablet daily.   0   No facility-administered medications prior to visit.      Allergies: Other and Food    PHYSICAL EXAM: VS:  BP 100/60 (BP Location: Right Arm, Patient Position: Sitting, Cuff Size: Normal)   Pulse (!) 59   Ht 5\' 4"  (1.626 m)   Wt 179 lb 8 oz (81.4 kg)   BMI 30.81 kg/m  , Body mass index is 30.81 kg/m. Wt Readings from Last 3 Encounters:  01/24/17 179 lb 8 oz (81.4 kg)  01/22/17 178 lb (80.7 kg)  01/15/17 178 lb (80.7 kg)    GENERAL:  well developed, well nourished, obese, not in acute distress HEENT: normocephalic, pink conjunctivae, anicteric sclerae, no xanthelasma, normal dentition, oropharynx clear NECK:  no neck vein engorgement, JVP normal, no hepatojugular reflux, carotid upstroke brisk and symmetric, no bruit, no thyromegaly, no lymphadenopathy LUNGS:  good respiratory effort, clear to auscultation bilaterally CV:  PMI not displaced, no thrills, no lifts, S1 and S2 within normal limits, no palpable S3 or S4, no murmurs, no rubs, no gallops ABD:  Soft, nontender, nondistended, normoactive bowel sounds, no abdominal aortic bruit, no hepatomegaly, no splenomegaly MS: nontender back, no kyphosis, no scoliosis, no joint deformities EXT:  2+ DP/PT pulses, no edema, no varicosities, no cyanosis, no clubbing SKIN: warm, nondiaphoretic, normal turgor, no ulcers NEUROPSYCH: alert, oriented to person, place, and time, sensory/motor grossly intact, normal mood, appropriate affect  Recent Labs: 07/20/2016: Magnesium 2.1 01/15/2017: BUN 13; Creat 0.89; Hemoglobin 12.4; Platelets 300; Potassium 4.3; Sodium 141; TSH 1.30 01/19/2017: ALT 45   Lipid Panel    Component Value Date/Time   CHOL 183 05/30/2016 1316   TRIG 107.0 05/30/2016 1316   HDL 64.30 05/30/2016 1316   CHOLHDL 3 05/30/2016 1316   VLDL 21.4 05/30/2016 1316   LDLCALC 98 05/30/2016 1316   LDLDIRECT 97.0 10/17/2016 1537       Other studies Reviewed:  EKG:  The ekg from 01/24/2017 was personally reviewed by me and it revealed sinus rhythm, 59 BPM.  Additional studies/ records that were reviewed personally reviewed by me today include: None available   ASSESSMENT AND PLAN: Chest pain Shortness of breath Risk factors for CAD include hyperlipidemia, obesity, family history Recommend further evaluation with echocardiogram and pharmacologic nuclear stress test. Patient unable to walk on the treadmill.   Current medicines are reviewed at length with the patient today.  The patient does not have concerns regarding medicines.  Labs/ tests ordered today include:  Orders Placed This Encounter  Procedures  . NM Myocar Multi W/Spect W/Wall Motion / EF  . EKG 12-Lead  . ECHOCARDIOGRAM COMPLETE    I had  a lengthy and detailed discussion with the patient regarding diagnoses, prognosis, diagnostic options.    Disposition:   FU with Cardiology after tests   Thank you for this consultation. We will forwarding this consultation to referring physician.   Signed, Wende Bushy, MD  01/24/2017 3:05 PM    [place]  This note was generated in part with voice recognition software and I apologize for any typographical errors that were not detected and corrected.

## 2017-01-30 ENCOUNTER — Ambulatory Visit
Admission: RE | Admit: 2017-01-30 | Discharge: 2017-01-30 | Disposition: A | Discharge status: Home / Self Care | Payer: BLUE CROSS/BLUE SHIELD | Source: Ambulatory Visit | Attending: Family Medicine | Admitting: Family Medicine

## 2017-01-30 DIAGNOSIS — K760 Fatty (change of) liver, not elsewhere classified: Secondary | ICD-10-CM | POA: Diagnosis not present

## 2017-01-30 DIAGNOSIS — Z9049 Acquired absence of other specified parts of digestive tract: Secondary | ICD-10-CM | POA: Insufficient documentation

## 2017-01-30 DIAGNOSIS — R7989 Other specified abnormal findings of blood chemistry: Secondary | ICD-10-CM | POA: Insufficient documentation

## 2017-01-30 DIAGNOSIS — R945 Abnormal results of liver function studies: Secondary | ICD-10-CM

## 2017-01-31 ENCOUNTER — Ambulatory Visit
Admission: RE | Admit: 2017-01-31 | Discharge: 2017-01-31 | Disposition: A | Discharge status: Home / Self Care | Payer: BLUE CROSS/BLUE SHIELD | Source: Ambulatory Visit | Attending: Pain Medicine | Admitting: Pain Medicine

## 2017-01-31 ENCOUNTER — Encounter: Payer: Self-pay | Admitting: Pain Medicine

## 2017-01-31 ENCOUNTER — Ambulatory Visit (HOSPITAL_BASED_OUTPATIENT_CLINIC_OR_DEPARTMENT_OTHER): Payer: BLUE CROSS/BLUE SHIELD | Admitting: Pain Medicine

## 2017-01-31 ENCOUNTER — Telehealth: Payer: Self-pay | Admitting: Cardiology

## 2017-01-31 VITALS — BP 132/65 | HR 60 | Temp 97.6°F | Resp 16 | Ht 64.0 in | Wt 176.0 lb

## 2017-01-31 DIAGNOSIS — M25512 Pain in left shoulder: Secondary | ICD-10-CM | POA: Diagnosis not present

## 2017-01-31 DIAGNOSIS — Z888 Allergy status to other drugs, medicaments and biological substances status: Secondary | ICD-10-CM | POA: Insufficient documentation

## 2017-01-31 DIAGNOSIS — M19011 Primary osteoarthritis, right shoulder: Secondary | ICD-10-CM | POA: Diagnosis not present

## 2017-01-31 DIAGNOSIS — M25511 Pain in right shoulder: Secondary | ICD-10-CM | POA: Insufficient documentation

## 2017-01-31 DIAGNOSIS — G8929 Other chronic pain: Secondary | ICD-10-CM

## 2017-01-31 DIAGNOSIS — M7551 Bursitis of right shoulder: Secondary | ICD-10-CM | POA: Insufficient documentation

## 2017-01-31 DIAGNOSIS — Z9049 Acquired absence of other specified parts of digestive tract: Secondary | ICD-10-CM | POA: Diagnosis not present

## 2017-01-31 DIAGNOSIS — Z9889 Other specified postprocedural states: Secondary | ICD-10-CM | POA: Insufficient documentation

## 2017-01-31 MED ORDER — MIDAZOLAM HCL 5 MG/5ML IJ SOLN
1.0000 mg | INTRAMUSCULAR | Status: DC | PRN
  Administered 2017-01-31: 3 mg via INTRAVENOUS
  Filled 2017-01-31: qty 5

## 2017-01-31 MED ORDER — METHYLPREDNISOLONE ACETATE 80 MG/ML IJ SUSP
80.0000 mg | Freq: Once | INTRAMUSCULAR | Status: AC
  Administered 2017-01-31: 80 mg
  Filled 2017-01-31: qty 1

## 2017-01-31 MED ORDER — LACTATED RINGERS IV SOLN
1000.0000 mL | Freq: Once | INTRAVENOUS | Status: AC
  Administered 2017-01-31: 1000 mL via INTRAVENOUS

## 2017-01-31 MED ORDER — ROPIVACAINE HCL 5 MG/ML IJ SOLN
5.0000 mL | Freq: Once | INTRAMUSCULAR | Status: AC
  Administered 2017-01-31: 5 mL via PERINEURAL
  Filled 2017-01-31: qty 20

## 2017-01-31 MED ORDER — LIDOCAINE HCL (PF) 1 % IJ SOLN: 10.0000 mL | Freq: Once | INTRAMUSCULAR | Status: DC

## 2017-01-31 MED ORDER — FENTANYL CITRATE (PF) 100 MCG/2ML IJ SOLN
25.0000 ug | INTRAMUSCULAR | Status: DC | PRN
  Administered 2017-01-31: 100 ug via INTRAVENOUS
  Filled 2017-01-31: qty 2

## 2017-01-31 NOTE — Patient Instructions (Addendum)

## 2017-01-31 NOTE — Telephone Encounter (Signed)
Pt calling stating she is having a Myoview in the morning She was not told to not take any of her medications She is making sure she doesn't take anything she's not supposed to  Please advise

## 2017-01-31 NOTE — Progress Notes (Signed)
Safety precautions to be maintained throughout the outpatient stay will include: orient to surroundings, keep bed in low position, maintain call bell within reach at all times, provide assistance with transfer out of bed and ambulation.  

## 2017-01-31 NOTE — Progress Notes (Signed)
Patient's Name: Nicole Walter  MRN: [id number]  Referring Provider: Milinda Pointer, MD  DOB: [birth year]  PCP: Leone Haven, MD  DOS: 01/31/2017  Note by: Kathlen Brunswick. Dossie Arbour, MD  Service setting: Ambulatory outpatient  Location: [facility] (AMB) Pain Management Facility  Visit type: Procedure  Specialty: Interventional Pain Management  Patient type: Established   Primary Reason for Visit: Interventional Pain Management Treatment. CC: Shoulder Pain (right)  Procedure:  Anesthesia, Analgesia, Anxiolysis:  Type: Diagnostic Suprascapular nerve Block Region: Posterior Shoulder & Scapular Areas Level: Superior to the scapular spine, in the lateral aspect of the supraspinatus fossa (Suprescapular notch). Laterality: Right-Side  Type: Local Anesthesia with Moderate (Conscious) Sedation Local Anesthetic: Lidocaine 1% Route: Intravenous (IV) IV Access: Secured Sedation: Meaningful verbal contact was maintained at all times during the procedure  Indication(s): Analgesia and Anxiety  Indications: 1. Chronic shoulder pain (Location of Tertiary source of pain) (Bilateral) (L>R)   2. Bursitis of right shoulder   3. Osteoarthritis of shoulder (Right)    Pain Score: Pre-procedure: 3 /10 Post-procedure: 0-No pain (states shoulder has a cramp - states usually numb)/10  Pre-op Assessment:  Previous date of service: 01/22/17 Service provided: Med Refill Nicole Walter is a 56 y.o. (year old), female patient, seen today for interventional treatment. She  has a past surgical history that includes Cholecystectomy; Tubal ligation; Ankle surgery; Nasal sinus surgery; Tonsillectomy; Colonoscopy; Esophagogastroduodenoscopy (egd) with propofol (N/A, 02/22/2016); Colonoscopy with propofol (N/A, 02/22/2016); and Breast biopsy (Left, 1997). Her primarily concern today is the Shoulder Pain (right)  Initial Vital Signs: Blood pressure 135/67, pulse 60, temperature 97.6 F (36.4 C), temperature source Oral, resp. rate 16,  height 5\' 4"  (1.626 m), weight 176 lb (79.8 kg), SpO2 98 %. BMI: 30.21 kg/m  Risk Assessment: Allergies: Reviewed. She is allergic to other and food.  Allergy Precautions: None required Coagulopathies: "Reviewed. None identified.  Blood-thinner therapy: None at this time Active Infection(s): Reviewed. None identified. Nicole Walter is afebrile  Site Confirmation: Ms. Thien was asked to confirm the procedure and laterality before marking the site Procedure checklist: Completed Consent: Before the procedure and under the influence of no sedative(s), amnesic(s), or anxiolytics, the patient was informed of the treatment options, risks and possible complications. To fulfill our ethical and legal obligations, as recommended by the American Medical Association's Code of Ethics, I have informed the patient of my clinical impression; the nature and purpose of the treatment or procedure; the risks, benefits, and possible complications of the intervention; the alternatives, including doing nothing; the risk(s) and benefit(s) of the alternative treatment(s) or procedure(s); and the risk(s) and benefit(s) of doing nothing. The patient was provided information about the general risks and possible complications associated with the procedure. These may include, but are not limited to: failure to achieve desired goals, infection, bleeding, organ or nerve damage, allergic reactions, paralysis, and death. In addition, the patient was informed of those risks and complications associated to the procedure, such as failure to decrease pain; infection; bleeding; organ or nerve damage with subsequent damage to sensory, motor, and/or autonomic systems, resulting in permanent pain, numbness, and/or weakness of one or several areas of the body; allergic reactions; (i.e.: anaphylactic reaction); and/or death. Furthermore, the patient was informed of those risks and complications associated with the medications. These include, but  are not limited to: allergic reactions (i.e.: anaphylactic or anaphylactoid reaction(s)); adrenal axis suppression; blood sugar elevation that in diabetics may result in ketoacidosis or comma; water retention that in patients with history of congestive  heart failure may result in shortness of breath, pulmonary edema, and decompensation with resultant heart failure; weight gain; swelling or edema; medication-induced neural toxicity; particulate matter embolism and blood vessel occlusion with resultant organ, and/or nervous system infarction; and/or aseptic necrosis of one or more joints. Finally, the patient was informed that Medicine is not an exact science; therefore, there is also the possibility of unforeseen or unpredictable risks and/or possible complications that may result in a catastrophic outcome. The patient indicated having understood very clearly. We have given the patient no guarantees and we have made no promises. Enough time was given to the patient to ask questions, all of which were answered to the patient's satisfaction. Nicole Walter has indicated that she wanted to continue with the procedure. Attestation: I, the ordering provider, attest that I have discussed with the patient the benefits, risks, side-effects, alternatives, likelihood of achieving goals, and potential problems during recovery for the procedure that I have provided informed consent. Date: 01/31/2017; Time: 9:03 AM  Pre-Procedure Preparation:  Monitoring: As per clinic protocol. Respiration, ETCO2, SpO2, BP, heart rate and rhythm monitor placed and checked for adequate function Safety Precautions: Patient was assessed for positional comfort and pressure points before starting the procedure. Time-out: I initiated and conducted the "Time-out" before starting the procedure, as per protocol. The patient was asked to participate by confirming the accuracy of the "Time Out" information. Verification of the correct person, site, and  procedure were performed and confirmed by me, the nursing staff, and the patient. "Time-out" conducted as per Joint Commission's Universal Protocol (UP.01.01.01). "Time-out" Date & Time: 01/31/2017; 0935 hrs.  Description of Procedure Process:   Position: Prone Target Area: Suprascapular notch. Approach: Posterior approach. Area Prepped: Entire shoulder Area Prepping solution: ChloraPrep (2% chlorhexidine gluconate and 70% isopropyl alcohol) Safety Precautions: Aspiration looking for blood return was conducted prior to all injections. At no point did we inject any substances, as a needle was being advanced. No attempts were made at seeking any paresthesias. Safe injection practices and needle disposal techniques used. Medications properly checked for expiration dates. SDV (single dose vial) medications used. Description of the Procedure: Protocol guidelines were followed. The patient was placed in position over the procedure table. The target area was identified and the area prepped in the usual manner. Skin & deeper tissues infiltrated with local anesthetic. Appropriate amount of time allowed to pass for local anesthetics to take effect. The procedure needles were then advanced to the target area. Proper needle placement secured. Negative aspiration confirmed. Solution injected in intermittent fashion, asking for systemic symptoms every 0.5cc of injectate. The needles were then removed and the area cleansed, making sure to leave some of the prepping solution back to take advantage of its long term bactericidal properties. Vitals:   01/31/17 0946 01/31/17 0955 01/31/17 1005 01/31/17 1010  BP: 121/71 (!) 114/54 124/64 132/65  Pulse:      Resp: 16 14 16 16   Temp:      TempSrc:      SpO2: 90% 94% 93% 94%  Weight:      Height:        Start Time: 0935 hrs. End Time: 0945 hrs. Materials:  Needle(s) Type: Regular needle Gauge: 22G Length: 3.5-in Medication(s): We administered fentaNYL, lactated  ringers, midazolam, methylPREDNISolone acetate, and ropivacaine (PF) 5 mg/mL (0.5%). Please see chart orders for dosing details.  Imaging Guidance (Non-Spinal):  Type of Imaging Technique: Fluoroscopy Guidance (Non-Spinal) Indication(s): Assistance in needle guidance and placement for procedures requiring needle placement  in or near specific anatomical locations not easily accessible without such assistance. Exposure Time: Please see nurses notes. Contrast: Before injecting any contrast, we confirmed that the patient did not have an allergy to iodine, shellfish, or radiological contrast. Once satisfactory needle placement was completed at the desired level, radiological contrast was injected. Contrast injected under live fluoroscopy. No contrast complications. See chart for type and volume of contrast used. Fluoroscopic Guidance: I was personally present during the use of fluoroscopy. "Tunnel Vision Technique" used to obtain the best possible view of the target area. Parallax error corrected before commencing the procedure. "Direction-depth-direction" technique used to introduce the needle under continuous pulsed fluoroscopy. Once target was reached, antero-posterior, oblique, and lateral fluoroscopic projection used confirm needle placement in all planes. Images permanently stored in EMR. Interpretation: I personally interpreted the imaging intraoperatively. Adequate needle placement confirmed in multiple planes. Appropriate spread of contrast into desired area was observed. No evidence of afferent or efferent intravascular uptake. Permanent images saved into the patient's record.  Antibiotic Prophylaxis:  Indication(s): None identified Antibiotic given: None  Post-operative Assessment:  EBL: None Complications: No immediate post-treatment complications observed by team, or reported by patient. Note: The patient tolerated the entire procedure well. A repeat set of vitals were taken after the  procedure and the patient was kept under observation following institutional policy, for this type of procedure. Post-procedural neurological assessment was performed, showing return to baseline, prior to discharge. The patient was provided with post-procedure discharge instructions, including a section on how to identify potential problems. Should any problems arise concerning this procedure, the patient was given instructions to immediately contact us, at any time, without hesitation. In any case, we plan to contact the patient by telephone for a follow-up status report regarding this interventional procedure. Comments:  No additional relevant information.  Plan of Care  Disposition: Discharge home  Discharge Date & Time: 01/31/2017; 1021 hrs.  Physician-requested Follow-up:  Return in about 2 weeks (around 02/14/2017) for Post-Procedure evaluation.  Future Appointments Date Time Provider [place]  02/01/2017 9:00 AM [facility] [place]  02/07/2017 3:00 PM [facility]  02/20/2017 9:45 AM Milinda Pointer, MD [facility] None  02/23/2017 7:30 AM [facility] [place] [facility]  03/19/2017 8:00 AM Crystal Dorrene German, NP [facility] None  04/18/2017 10:00 AM Leone Haven, MD [facility] None   Medications ordered for procedure: Meds ordered this encounter  Medications  . fentaNYL (SUBLIMAZE) injection 25-50 mcg    Make sure Narcan is available in the pyxis when using this medication. In the event of respiratory depression (RR< 8/min): Titrate NARCAN (naloxone) in increments of 0.1 to 0.2 mg IV at 2-3 minute intervals, until desired degree of reversal.  . lactated ringers infusion 1,000 mL  . midazolam (VERSED) 5 MG/5ML injection 1-2 mg    Make sure Flumazenil is available in the pyxis when using this medication. If oversedation occurs, administer 0.2 mg IV over 15 sec. If after 45 sec no response, administer 0.2 mg again over 1 min; may repeat at 1 min intervals; not to  exceed 4 doses (1 mg)  . methylPREDNISolone acetate (DEPO-MEDROL) injection 80 mg  . ropivacaine (PF) 5 mg/mL (0.5%) (NAROPIN) injection 5 mL  . lidocaine (PF) (XYLOCAINE) 1 % injection 10 mL   Medications administered: We administered fentaNYL, lactated ringers, midazolam, methylPREDNISolone acetate, and ropivacaine (PF) 5 mg/mL (0.5%).  See the medical record for exact dosing, route, and time of administration.  Lab-work, Procedure(s), & Referral(s) Ordered:  Orders Placed This Encounter  Procedures  . DG C-Arm 1-60 Min-No Report  . Discharge instructions  . Follow-up  . Informed Consent Details: Transcribe to consent form and obtain patient signature  . Provider attestation of informed consent for procedure/surgical case  . Verify informed consent   Imaging Ordered: Results for orders placed in visit on 01/01/17  DG C-Arm 1-60 Min-No Report   Narrative Fluoroscopy was utilized by the requesting physician.  No radiographic  interpretation.    New Prescriptions   No medications on file   Primary Care Physician: Leone Haven, MD Location: [facility] Note by: Kathlen Brunswick. Dossie Arbour, M.D, DABA, DABAPM, DABPM, DABIPP, FIPP Date: 01/31/2017; Time: 11:06 AM  Disclaimer:  Medicine is not an Chief Strategy Officer. The only guarantee in medicine is that nothing is guaranteed. It is important to note that the decision to proceed with this intervention was based on the information collected from the patient. The Data and conclusions were drawn from the patient's questionnaire, the interview, and the physical examination. Because the information was provided in large part by the patient, it cannot be guaranteed that it has not been purposely or unconsciously manipulated. Every effort has been made to obtain as much relevant data as possible for this evaluation. It is important to note that the conclusions that lead to this procedure are derived in large part from the  available data. Always take into account that the treatment will also be dependent on availability of resources and existing treatment guidelines, considered by other Pain Management Practitioners as being common knowledge and practice, at the time of the intervention. For Medico-Legal purposes, it is also important to point out that variation in procedural techniques and pharmacological choices are the acceptable norm. The indications, contraindications, technique, and results of the above procedure should only be interpreted and judged by a Board-Certified Interventional Pain Specialist with extensive familiarity and expertise in the same exact procedure and technique. Attempts at providing opinions without similar or greater experience and expertise than that of the treating physician will be considered as inappropriate and unethical, and shall result in a formal complaint to the state medical board and applicable specialty societies.  Instructions provided at this appointment: Patient Instructions  Post-Procedure instructions Instructions:  Apply ice: Fill a plastic sandwich bag with crushed ice. Cover it with a small towel and apply to injection site. Apply for 15 minutes then remove x 15 minutes. Repeat sequence on day of procedure, until you go to bed. The purpose is to minimize swelling and discomfort after procedure.  Apply heat: Apply heat to procedure site starting the day following the procedure. The purpose is to treat any soreness and discomfort from the procedure.  Food intake: Start with clear liquids (like water) and advance to regular food, as tolerated.   Physical activities: Keep activities to a minimum for the first 8 hours after the procedure.   Driving: If you have received any sedation, you are not allowed to drive for 24 hours after your procedure.  Blood thinner: Restart your blood thinner 6 hours after your procedure. (Only for those taking blood thinners)  Insulin: As  soon as you can eat, you may resume your normal dosing schedule. (Only for those taking insulin)  Infection prevention: Keep procedure site clean and dry.  Post-procedure Pain Diary: Extremely important that this be done correctly and accurately. Recorded information will be used to determine the next step in treatment.  Pain evaluated is that of treated  area only. Do not include pain from an untreated area.  Complete every hour, on the hour, for the initial 8 hours. Set an alarm to help you do this part accurately.  Do not go to sleep and have it completed later. It will not be accurate.  Follow-up appointment: Keep your follow-up appointment after the procedure. Usually 2 weeks for most procedures. (6 weeks in the case of radiofrequency.) Bring you pain diary.  Expect:  From numbing medicine (AKA: Local Anesthetics): Numbness or decrease in pain.  Onset: Full effect within 15 minutes of injected.  Duration: It will depend on the type of local anesthetic used. On the average, 1 to 8 hours.   From steroids: Decrease in swelling or inflammation. Once inflammation is improved, relief of the pain will follow.  Onset of benefits: Depends on the amount of swelling present. The more swelling, the longer it will take for the benefits to be seen.   Duration: Steroids will stay in the system x 2 weeks. Duration of benefits will depend on multiple posibilities including persistent irritating factors.  From procedure: Some discomfort is to be expected once the numbing medicine wears off. This should be minimal if ice and heat are applied as instructed. Call if:  You experience numbness and weakness that gets worse with time, as opposed to wearing off.  New onset bowel or bladder incontinence. (Spinal procedures only)  Emergency Numbers:  Durning business hours (Monday - Thursday, 8:00 AM - 4:00 PM) (Friday, 9:00 AM - 12:00 Noon): [phone]  After hours: [phone])  [phone]   __________________________________________________________________________________________   Pain Management Discharge Instructions  General Discharge Instructions :  If you need to reach your doctor call: Monday-Friday 8:00 am - 4:00 pm at [phone] or toll free [phone].  After clinic hours [phone] to have operator reach doctor.  Bring all of your medication bottles to all your appointments in the pain clinic.  To cancel or reschedule your appointment with Pain Management please remember to call 24 hours in advance to avoid a fee.  Refer to the educational materials which you have been given on: General Risks, I had my Procedure. Discharge Instructions, Post Sedation.  Post Procedure Instructions:  The drugs you were given will stay in your system until tomorrow, so for the next 24 hours you should not drive, make any legal decisions or drink any alcoholic beverages.  You may eat anything you prefer, but it is better to start with liquids then soups and crackers, and gradually work up to solid foods.  Please notify your doctor immediately if you have any unusual bleeding, trouble breathing or pain that is not related to your normal pain.  Depending on the type of procedure that was done, some parts of your body may feel week and/or numb.  This usually clears up by tonight or the next day.  Walk with the use of an assistive device or accompanied by an adult for the 24 hours.  You may use ice on the affected area for the first 24 hours.  Put ice in a Ziploc bag and cover with a towel and place against area 15 minutes on 15 minutes off.  You may switch to heat after 24 hours.

## 2017-01-31 NOTE — Telephone Encounter (Signed)
Reviewed myoview instructions w/pt who verbalized understanding. Pt states she will wait until after the test to take her morning medications. She has refrained from smoking and caffeine today.

## 2017-02-01 ENCOUNTER — Encounter
Admission: RE | Admit: 2017-02-01 | Discharge: 2017-02-01 | Disposition: A | Discharge status: Home / Self Care | Payer: BLUE CROSS/BLUE SHIELD | Source: Ambulatory Visit | Attending: Cardiology | Admitting: Cardiology

## 2017-02-01 DIAGNOSIS — R079 Chest pain, unspecified: Secondary | ICD-10-CM | POA: Diagnosis not present

## 2017-02-01 DIAGNOSIS — R0602 Shortness of breath: Secondary | ICD-10-CM | POA: Diagnosis not present

## 2017-02-01 MED ORDER — REGADENOSON 0.4 MG/5ML IV SOLN
0.4000 mg | Freq: Once | INTRAVENOUS | Status: AC
  Administered 2017-02-01: 0.4 mg via INTRAVENOUS

## 2017-02-01 MED ORDER — TECHNETIUM TC 99M TETROFOSMIN IV KIT
28.4700 | PACK | Freq: Once | INTRAVENOUS | Status: AC | PRN
  Administered 2017-02-01: 28.47 via INTRAVENOUS

## 2017-02-01 MED ORDER — TECHNETIUM TC 99M TETROFOSMIN IV KIT
12.5100 | PACK | Freq: Once | INTRAVENOUS | Status: AC | PRN
  Administered 2017-02-01: 12.51 via INTRAVENOUS

## 2017-02-01 NOTE — Telephone Encounter (Signed)
Spoke with patient's husband, denies any questions or concerns re; procedure on yesterday.

## 2017-02-02 LAB — NM MYOCAR MULTI W/SPECT W/WALL MOTION / EF
CHL CUP NUCLEAR SDS: 0
CHL CUP NUCLEAR SRS: 2
CHL CUP STRESS STAGE 3 GRADE: 0 %
CHL CUP STRESS STAGE 3 SPEED: 0 mph
CHL CUP STRESS STAGE 4 GRADE: 0 %
CHL CUP STRESS STAGE 4 SPEED: 0 mph
CHL CUP STRESS STAGE 5 GRADE: 0 %
CHL CUP STRESS STAGE 6 SBP: 114 mmHg
CHL CUP STRESS STAGE 6 SPEED: 0 mph
CSEPPMHR: 56 %
Estimated workload: 1 METS
LV sys vol: 17 mL
LVDIAVOL: 62 mL (ref 46–106)
NUC STRESS TID: 0.95
Peak HR: 94 {beats}/min
Percent HR: 57 %
Rest HR: 63 {beats}/min
SSS: 1
Stage 1 HR: 64 {beats}/min
Stage 2 Grade: 0 %
Stage 2 HR: 62 {beats}/min
Stage 2 Speed: 0 mph
Stage 3 HR: 62 {beats}/min
Stage 4 HR: 94 {beats}/min
Stage 5 HR: 93 {beats}/min
Stage 5 Speed: 0 mph
Stage 6 DBP: 61 mmHg
Stage 6 Grade: 0 %
Stage 6 HR: 89 {beats}/min

## 2017-02-07 ENCOUNTER — Ambulatory Visit
Admission: RE | Admit: 2017-02-07 | Discharge: 2017-02-07 | Disposition: A | Discharge status: Home / Self Care | Payer: BLUE CROSS/BLUE SHIELD | Source: Ambulatory Visit | Attending: Pain Medicine | Admitting: Pain Medicine

## 2017-02-07 DIAGNOSIS — G8929 Other chronic pain: Secondary | ICD-10-CM

## 2017-02-07 DIAGNOSIS — K5730 Diverticulosis of large intestine without perforation or abscess without bleeding: Secondary | ICD-10-CM | POA: Insufficient documentation

## 2017-02-07 DIAGNOSIS — M1611 Unilateral primary osteoarthritis, right hip: Secondary | ICD-10-CM | POA: Insufficient documentation

## 2017-02-07 DIAGNOSIS — M25559 Pain in unspecified hip: Secondary | ICD-10-CM | POA: Insufficient documentation

## 2017-02-07 DIAGNOSIS — M160 Bilateral primary osteoarthritis of hip: Secondary | ICD-10-CM

## 2017-02-08 ENCOUNTER — Other Ambulatory Visit: Payer: Self-pay | Admitting: Family Medicine

## 2017-02-08 DIAGNOSIS — K760 Fatty (change of) liver, not elsewhere classified: Secondary | ICD-10-CM

## 2017-02-09 ENCOUNTER — Other Ambulatory Visit: Payer: Self-pay | Admitting: Family Medicine

## 2017-02-12 NOTE — Telephone Encounter (Signed)
This was a historical med. Please advise for refills. thanks

## 2017-02-14 ENCOUNTER — Other Ambulatory Visit (INDEPENDENT_AMBULATORY_CARE_PROVIDER_SITE_OTHER): Payer: BLUE CROSS/BLUE SHIELD

## 2017-02-14 DIAGNOSIS — K760 Fatty (change of) liver, not elsewhere classified: Secondary | ICD-10-CM | POA: Diagnosis not present

## 2017-02-14 LAB — HEPATITIS A ANTIBODY, TOTAL: HEP A TOTAL AB: NONREACTIVE

## 2017-02-14 LAB — IRON,TIBC AND FERRITIN PANEL
%SAT: 38 % (ref 11–50)
Ferritin: 115 ng/mL (ref 10–232)
Iron: 133 ug/dL (ref 45–160)
TIBC: 354 ug/dL (ref 250–450)

## 2017-02-14 LAB — HEPATITIS C ANTIBODY: HCV AB: NEGATIVE

## 2017-02-14 LAB — HEPATITIS B CORE ANTIBODY, TOTAL: HEP B C TOTAL AB: NONREACTIVE

## 2017-02-14 LAB — HEPATITIS B SURFACE ANTIGEN: Hepatitis B Surface Ag: NEGATIVE

## 2017-02-14 LAB — HEPATITIS B SURFACE ANTIBODY,QUALITATIVE: HEP B S AB: NEGATIVE

## 2017-02-16 LAB — ANTI-SMOOTH MUSCLE ANTIBODY, IGG: Smooth Muscle Ab: <20 U (ref <20)

## 2017-02-17 LAB — ANTI-MICROSOMAL ANTIBODY LIVER / KIDNEY

## 2017-02-20 ENCOUNTER — Encounter: Payer: Self-pay | Admitting: Pain Medicine

## 2017-02-20 ENCOUNTER — Ambulatory Visit: Payer: BLUE CROSS/BLUE SHIELD | Attending: Pain Medicine | Admitting: Pain Medicine

## 2017-02-20 VITALS — BP 107/68 | HR 62 | Temp 98.1°F | Resp 16 | Ht 64.0 in | Wt 172.0 lb

## 2017-02-20 DIAGNOSIS — M25511 Pain in right shoulder: Secondary | ICD-10-CM | POA: Diagnosis not present

## 2017-02-20 DIAGNOSIS — M542 Cervicalgia: Secondary | ICD-10-CM | POA: Diagnosis not present

## 2017-02-20 DIAGNOSIS — Z87891 Personal history of nicotine dependence: Secondary | ICD-10-CM | POA: Insufficient documentation

## 2017-02-20 DIAGNOSIS — E7801 Familial hypercholesterolemia: Secondary | ICD-10-CM | POA: Insufficient documentation

## 2017-02-20 DIAGNOSIS — M797 Fibromyalgia: Secondary | ICD-10-CM | POA: Insufficient documentation

## 2017-02-20 DIAGNOSIS — M25559 Pain in unspecified hip: Secondary | ICD-10-CM

## 2017-02-20 DIAGNOSIS — E669 Obesity, unspecified: Secondary | ICD-10-CM | POA: Diagnosis not present

## 2017-02-20 DIAGNOSIS — M79605 Pain in left leg: Secondary | ICD-10-CM | POA: Insufficient documentation

## 2017-02-20 DIAGNOSIS — M1610 Unilateral primary osteoarthritis, unspecified hip: Secondary | ICD-10-CM | POA: Insufficient documentation

## 2017-02-20 DIAGNOSIS — M47896 Other spondylosis, lumbar region: Secondary | ICD-10-CM | POA: Insufficient documentation

## 2017-02-20 DIAGNOSIS — Z79891 Long term (current) use of opiate analgesic: Secondary | ICD-10-CM | POA: Diagnosis not present

## 2017-02-20 DIAGNOSIS — G894 Chronic pain syndrome: Secondary | ICD-10-CM | POA: Insufficient documentation

## 2017-02-20 DIAGNOSIS — M47894 Other spondylosis, thoracic region: Secondary | ICD-10-CM | POA: Diagnosis not present

## 2017-02-20 DIAGNOSIS — M160 Bilateral primary osteoarthritis of hip: Secondary | ICD-10-CM

## 2017-02-20 DIAGNOSIS — M8580 Other specified disorders of bone density and structure, unspecified site: Secondary | ICD-10-CM | POA: Diagnosis not present

## 2017-02-20 DIAGNOSIS — M7551 Bursitis of right shoulder: Secondary | ICD-10-CM | POA: Diagnosis not present

## 2017-02-20 DIAGNOSIS — F419 Anxiety disorder, unspecified: Secondary | ICD-10-CM | POA: Diagnosis not present

## 2017-02-20 DIAGNOSIS — M47892 Other spondylosis, cervical region: Secondary | ICD-10-CM | POA: Diagnosis not present

## 2017-02-20 DIAGNOSIS — M7061 Trochanteric bursitis, right hip: Secondary | ICD-10-CM | POA: Diagnosis not present

## 2017-02-20 DIAGNOSIS — R0602 Shortness of breath: Secondary | ICD-10-CM | POA: Diagnosis not present

## 2017-02-20 DIAGNOSIS — M25512 Pain in left shoulder: Secondary | ICD-10-CM | POA: Diagnosis not present

## 2017-02-20 DIAGNOSIS — G8929 Other chronic pain: Secondary | ICD-10-CM

## 2017-02-20 DIAGNOSIS — R079 Chest pain, unspecified: Secondary | ICD-10-CM | POA: Insufficient documentation

## 2017-02-20 DIAGNOSIS — F329 Major depressive disorder, single episode, unspecified: Secondary | ICD-10-CM | POA: Diagnosis not present

## 2017-02-20 DIAGNOSIS — R7982 Elevated C-reactive protein (CRP): Secondary | ICD-10-CM | POA: Diagnosis not present

## 2017-02-20 DIAGNOSIS — Z9889 Other specified postprocedural states: Secondary | ICD-10-CM | POA: Insufficient documentation

## 2017-02-20 DIAGNOSIS — M546 Pain in thoracic spine: Secondary | ICD-10-CM | POA: Insufficient documentation

## 2017-02-20 DIAGNOSIS — K5730 Diverticulosis of large intestine without perforation or abscess without bleeding: Secondary | ICD-10-CM | POA: Insufficient documentation

## 2017-02-20 DIAGNOSIS — R700 Elevated erythrocyte sedimentation rate: Secondary | ICD-10-CM | POA: Insufficient documentation

## 2017-02-20 DIAGNOSIS — Z888 Allergy status to other drugs, medicaments and biological substances status: Secondary | ICD-10-CM | POA: Insufficient documentation

## 2017-02-20 DIAGNOSIS — M19011 Primary osteoarthritis, right shoulder: Secondary | ICD-10-CM

## 2017-02-20 DIAGNOSIS — Z9049 Acquired absence of other specified parts of digestive tract: Secondary | ICD-10-CM | POA: Insufficient documentation

## 2017-02-20 DIAGNOSIS — M25561 Pain in right knee: Secondary | ICD-10-CM

## 2017-02-20 DIAGNOSIS — Z79899 Other long term (current) drug therapy: Secondary | ICD-10-CM | POA: Insufficient documentation

## 2017-02-20 NOTE — Patient Instructions (Addendum)
Bring someone who can drive you home. You will not be able to drive for 24 hours after the procedure. Do not eat 8 hours before your procedure time.You may have clear liquids up until 3 hours before your procedure time.

## 2017-02-20 NOTE — Progress Notes (Addendum)
Patient's Name: Nicole Walter  MRN: [id number]  Referring Provider: Leone Haven, MD  DOB: 1961-06-04  PCP: Leone Haven, MD  DOS: 02/20/2017  Note by: Kathlen Brunswick. Dossie Arbour, MD  Service setting: Ambulatory outpatient  Specialty: Interventional Pain Management  Location: [facility] (AMB) Pain Management Facility    Patient type: Established   Primary Reason(s) for Visit: Encounter for post-procedure evaluation of chronic illness with mild to moderate exacerbation CC: Shoulder Pain (right); Hip Pain (right); and Knee Pain (right)  HPI  Nicole Walter is a 56 y.o. year old, female patient, who comes today for a post-procedure evaluation. She has Anxiety and depression; Chronic pain syndrome; Lumbar spondylosis; Combined fat and carbohydrate induced hyperlipemia; Thoracic back pain; Chronic neck pain (Location of Primary Source of Pain) (Bilateral) (L>R); Concentration deficit; Hyperlipidemia; Cervical osteoarthritis; Fibromyalgia; Osteopenia; Skin lesions; Chronic hip pain (Bilateral) (L>R); Bursitis of right shoulder; Chronic Greater trochanteric bursitis (Bilateral) (L>R); Long term current use of opiate analgesic; Long term prescription opiate use; Opiate use; Chronic upper back pain (Location of Secondary source of pain) (Bilateral) (L>R); Chronic shoulder pain (Location of Tertiary source of pain) (Bilateral) (L>R); Chronic low back pain (Bilateral) (L>R); Chronic knee pain (Right); Chronic upper extremity pain (Left); Chronic lower extremity pain (Bilateral) (L>R); Chronic abdominal pain; Elevated sedimentation rate; Elevated C-reactive protein (CRP); Obesity; Greater trochanteric bursitis (Right); Subacromial bursitis of shoulder joint (Right); Osteoarthritis of hip (Bilateral) (L>R); Thoracic radiculitis (R>L); Thoracic spondylosis; Shortness of breath; and Osteoarthritis of shoulder (Right) on her problem list. Her primarily concern today is the Shoulder Pain (right); Hip Pain (right); and Knee Pain  (right)  Pain Assessment: Self-Reported Pain Score: 1 /10             Reported level is compatible with observation.       Pain Type: Chronic pain Pain Location: Shoulder Pain Orientation: Right Pain Descriptors / Indicators: Aching, Burning, Radiating, Penetrating, Constant Pain Frequency: Constant  Nicole Walter comes in today for post-procedure evaluation after the treatment done on 01/31/2017. The patient indicates having attained significantly good relief of the shoulder pain with the block. At this point she is not having much pain in the area of the shoulder and therefore we'll not need to repeat it however, I have given her the option of calling to request for a repeat block should she need it. If they continue to provide her with such good relief of the pain, we can consider the possibility of RFA. Today, the patient primary concern is that of her right knee pain. Previously we had done intra-articular hip joint injections with good results but she has started experiencing some right knee pain that she is not sure if it's coming from her hip, or her back, or if it's the knee itself. In an effort to clarify this, I will be bringing her back for a diagnostic intra-articular knee joint injection with localized static stance and steroids to see if this can provide her with good relief.  Further details on both, my assessment(s), as well as the proposed treatment plan, please see below.  Post-Procedure Assessment  01/31/2017 Procedure: Right sided diagnostic suprascapular nerve block under fluoroscopic guidance and IV sedation Pre-procedure pain score:  3/10 Post-procedure pain score: 0/10 (100% relief) Influential Factors: BMI: 29.52 kg/m Intra-procedural challenges: None observed Assessment challenges: None detected         Post-procedural side-effects, adverse reactions, or complications: None reported Reported issues: None  Sedation: Sedation provided. When no sedatives are used, the  analgesic  levels obtained are directly associated to the effectiveness of the local anesthetics. However, when sedation is provided, the level of analgesia obtained during the initial 1 hour following the intervention, is believed to be the result of a combination of factors. These factors may include, but are not limited to: 1. The effectiveness of the local anesthetics used. 2. The effects of the analgesic(s) and/or anxiolytic(s) used. 3. The degree of discomfort experienced by the patient at the time of the procedure. 4. The patients ability and reliability in recalling and recording the events. 5. The presence and influence of possible secondary gains and/or psychosocial factors. Reported result: Relief experienced during the 1st hour after the procedure: 100 % (Ultra-Short Term Relief) Interpretative annotation: Analgesia during this period is likely to be Local Anesthetic and/or IV Sedative (Analgesic/Anxiolitic) related.          Effects of local anesthetic: The analgesic effects attained during this period are directly associated to the localized infiltration of local anesthetics and therefore cary significant diagnostic value as to the etiological location, or anatomical origin, of the pain. Expected duration of relief is directly dependent on the pharmacodynamics of the local anesthetic used. Long-acting (4-6 hours) anesthetics used.  Reported result: Relief during the next 4 to 6 hour after the procedure: 90 % (Short-Term Relief) Interpretative annotation: Complete relief would suggest area to be the source of the pain.          Long-term benefit: Defined as the period of time past the expected duration of local anesthetics. With the possible exception of prolonged sympathetic blockade from the local anesthetics, benefits during this period are typically attributed to, or associated with, other factors such as analgesic sensory neuropraxia, antiinflammatory effects, or beneficial biochemical  changes provided by agents other than the local anesthetics Reported result: Extended relief following procedure: 75 % (Long-Term Relief) Interpretative annotation: Good relief. This could suggest inflammation to be a significant component in the etiology to the pain.          Current benefits: Defined as persistent relief that continues at this point in time.   Reported results: Treated area: 75 % Ms. Salazar reports improvement in function Interpretative annotation: Ongoing benefits would suggest effective therapeutic approach  Interpretation: Results would suggest a successful diagnostic intervention.          Laboratory Chemistry  Inflammation Markers Lab Results  Component Value Date   CRP 2.1 (H) 07/20/2016   ESRSEDRATE 37 (H) 07/20/2016   (CRP: Acute Phase) (ESR: Chronic Phase) Renal Function Markers Lab Results  Component Value Date   BUN 13 01/15/2017   CREATININE 0.89 01/15/2017   GFRAA >60 07/20/2016   GFRNONAA >60 07/20/2016   Hepatic Function Markers Lab Results  Component Value Date   AST 31 01/19/2017   ALT 45 (H) 01/19/2017   ALBUMIN 4.3 01/19/2017   ALKPHOS 74 01/19/2017   HCVAB NEGATIVE 02/14/2017   Electrolytes Lab Results  Component Value Date   NA 141 01/15/2017   K 4.3 01/15/2017   CL 103 01/15/2017   CALCIUM 9.3 01/15/2017   MG 2.1 07/20/2016   Neuropathy Markers Lab Results  Component Value Date   VITAMINB12 987 (H) 07/20/2016   Bone Pathology Markers Lab Results  Component Value Date   ALKPHOS 74 01/19/2017   25OHVITD1 58 07/20/2016   25OHVITD2 <1.0 07/20/2016   25OHVITD3 58 07/20/2016   CALCIUM 9.3 01/15/2017   Coagulation Parameters Lab Results  Component Value Date   PLT 300 01/15/2017   Cardiovascular  Markers Lab Results  Component Value Date   HGB 12.4 01/15/2017   HCT 37.8 01/15/2017   Note: Lab results reviewed.  Recent Diagnostic Imaging Review  Mr Hip Right Wo Contrast  Result Date: 02/07/2017 CLINICAL DATA:   Right hip, buttock and groin pain for 2 years. No known injury. EXAM: MR OF THE RIGHT HIP WITHOUT CONTRAST TECHNIQUE: Multiplanar, multisequence MR imaging was performed. No intravenous contrast was administered. COMPARISON:  CT abdomen and pelvis and 12/20/2015. FINDINGS: Bones: Small subchondral cyst in the right acetabular roof is noted. Bone marrow signal is otherwise normal. No fracture or stress change. No avascular necrosis of the femoral heads. Articular cartilage and labrum Articular cartilage:  Minimally degenerated. Labrum:  Intact. Joint or bursal effusion Joint effusion:  No effusion. Bursae:  Negative. Muscles and tendons Muscles and tendons:  Intact. Other findings Miscellaneous: Sigmoid diverticulosis without diverticulitis is seen. IMPRESSION: Mild right hip degenerative disease.  No acute finding. Sigmoid diverticulosis. Electronically Signed   By: Inge Rise M.D.   On: 02/07/2017 15:50   Note: Imaging results reviewed.          Meds  The patient has a current medication list which includes the following prescription(s): vitamin d, fluocinonide ointment, fluticasone, gabapentin, hydroxyzine, multi-vitamins, oxycodone, oxycodone, rabeprazole, simvastatin, sucralfate, tizanidine, trazodone, and trintellix.  Current Outpatient Prescriptions on File Prior to Visit  Medication Sig  . Cholecalciferol (VITAMIN D) 2000 units tablet Take 4,000 Units by mouth daily.  . fluocinonide ointment (LIDEX) 0.05 % APPLY TOPICALLY TWICE A DAY TO ITCHY AREAS ONLY. as needed  . fluticasone (FLONASE) 50 MCG/ACT nasal spray instill 2 sprays into each nostril once daily  . gabapentin (NEURONTIN) 300 MG capsule Take 1-3 capsules (300-900 mg total) by mouth every 8 (eight) hours. Follow titration schedule (Patient taking differently: Take 300-900 mg by mouth every 8 (eight) hours. Follow titration schedule)  . hydrOXYzine (VISTARIL) 25 MG capsule Take 25 mg by mouth 2 (two) times daily as needed.  .  Multiple Vitamin (MULTI-VITAMINS) TABS Take 1 tablet by mouth daily.   Marland Kitchen oxyCODONE (OXY IR/ROXICODONE) 5 MG immediate release tablet Take 1 tablet (5 mg total) by mouth every 6 (six) hours as needed for severe pain.  Derrill Memo ON 03/13/2017] oxyCODONE (OXY IR/ROXICODONE) 5 MG immediate release tablet Take 1 tablet (5 mg total) by mouth every 6 (six) hours as needed for severe pain.  . RABEprazole (ACIPHEX) 20 MG tablet Take 20 mg by mouth as needed.   . simvastatin (ZOCOR) 20 MG tablet take 1 tablet by mouth at bedtime  . sucralfate (CARAFATE) 1 g tablet take 1 tablet by mouth three times a day if needed for abdominal pain  . tiZANidine (ZANAFLEX) 2 MG tablet 1 tablet 2-3 times a day only as needed for severe muscles spasm.  . traZODone (DESYREL) 100 MG tablet take 1 tablet by mouth at bedtime if needed  . TRINTELLIX 10 MG TABS 1 tablet daily.    No current facility-administered medications on file prior to visit.    ROS  Constitutional: Denies any fever or chills Gastrointestinal: No reported hemesis, hematochezia, vomiting, or acute GI distress Musculoskeletal: Denies any acute onset joint swelling, redness, loss of ROM, or weakness Neurological: No reported episodes of acute onset apraxia, aphasia, dysarthria, agnosia, amnesia, paralysis, loss of coordination, or loss of consciousness  Allergies  Ms. Haseley is allergic to other and food.  PFSH  Drug: Ms. Esperanza  reports that she does not use drugs. Alcohol:  reports  that she does not drink alcohol. Tobacco:  reports that she quit smoking about 19 months ago. She started smoking about 42 years ago. She has never used smokeless tobacco. Medical:  has a past medical history of Abnormal liver enzymes (02/08/2016); Allergy; Anxiety; Chest pain (07/05/2016); Chronic pain; Depression; DJD (degenerative joint disease); Fibromyalgia; High cholesterol; and Rash of back (01/28/2016). Family: family history includes Arthritis in her father, paternal  grandfather, and paternal grandmother; Cancer in her brother and father; Dementia in her sister; Diabetes in her sister; Drug abuse in her brother, brother, and sister; Heart disease in her mother; Post-traumatic stress disorder in her brother; Stroke in her mother.  Past Surgical History:  Procedure Laterality Date  . ANKLE SURGERY     x 2  . BREAST BIOPSY Left 1997  . CHOLECYSTECTOMY    . COLONOSCOPY    . COLONOSCOPY WITH PROPOFOL N/A 02/22/2016   Procedure: COLONOSCOPY WITH PROPOFOL;  Surgeon: Lollie Sails, MD;  Location[facility] ENDOSCOPY;  Service: Endoscopy;  Laterality: N/A;  . ESOPHAGOGASTRODUODENOSCOPY (EGD) WITH PROPOFOL N/A 02/22/2016   Procedure: ESOPHAGOGASTRODUODENOSCOPY (EGD) WITH PROPOFOL;  Surgeon: Lollie Sails, MD;  Location: [facility] ENDOSCOPY;  Service: Endoscopy;  Laterality: N/A;  . NASAL SINUS SURGERY    . TONSILLECTOMY    . TUBAL LIGATION     Constitutional Exam  General appearance: Well nourished, well developed, and well hydrated. In no apparent acute distress Vitals:   02/20/17 0959  BP: 107/68  Pulse: 62  Resp: 16  Temp: 98.1 F (36.7 C)  TempSrc: Oral  SpO2: 100%  Weight: 172 lb (78 kg)  Height: '5\' 4"'  (1.626 m)   BMI Assessment: Estimated body mass index is 29.52 kg/m as calculated from the following:   Height as of this encounter: '5\' 4"'  (1.626 m).   Weight as of this encounter: 172 lb (78 kg).  BMI interpretation table: BMI level Category Range association with higher incidence of chronic pain  <18 kg/m2 Underweight   18.5-24.9 kg/m2 Ideal body weight   25-29.9 kg/m2 Overweight Increased incidence by 20%  30-34.9 kg/m2 Obese (Class I) Increased incidence by 68%  35-39.9 kg/m2 Severe obesity (Class II) Increased incidence by 136%  >40 kg/m2 Extreme obesity (Class III) Increased incidence by 254%   BMI Readings from Last 4 Encounters:  02/20/17 29.52 kg/m  01/31/17 30.21 kg/m  01/24/17 30.81 kg/m  01/22/17 30.55 kg/m   Wt Readings  from Last 4 Encounters:  02/20/17 172 lb (78 kg)  01/31/17 176 lb (79.8 kg)  01/24/17 179 lb 8 oz (81.4 kg)  01/22/17 178 lb (80.7 kg)  Psych/Mental status: Alert, oriented x 3 (person, place, & time)       Eyes: PERLA Respiratory: No evidence of acute respiratory distress  Cervical Spine Exam  Inspection: No masses, redness, or swelling Alignment: Symmetrical Functional ROM: Unrestricted ROM Stability: No instability detected Muscle strength & Tone: Functionally intact Sensory: Unimpaired Palpation: No palpable anomalies  Upper Extremity (UE) Exam    Side: Right upper extremity  Side: Left upper extremity  Inspection: No masses, redness, swelling, or asymmetry. No contractures  Inspection: No masses, redness, swelling, or asymmetry. No contractures  Functional ROM: Unrestricted ROM          Functional ROM: Unrestricted ROM          Muscle strength & Tone: Functionally intact  Muscle strength & Tone: Functionally intact  Sensory: Unimpaired  Sensory: Unimpaired  Palpation: No palpable anomalies  Palpation: No palpable anomalies  Specialized  Test(s): Deferred         Specialized Test(s): Deferred          Thoracic Spine Exam  Inspection: No masses, redness, or swelling Alignment: Symmetrical Functional ROM: Unrestricted ROM Stability: No instability detected Sensory: Unimpaired Muscle strength & Tone: No palpable anomalies  Lumbar Spine Exam  Inspection: No masses, redness, or swelling Alignment: Symmetrical Functional ROM: Unrestricted ROM Stability: No instability detected Muscle strength & Tone: Functionally intact Sensory: Unimpaired Palpation: No palpable anomalies Provocative Tests: Lumbar Hyperextension and rotation test: evaluation deferred today       Patrick's Maneuver: evaluation deferred today              Gait & Posture Assessment  Ambulation: Unassisted Gait: Relatively normal for age and body habitus Posture: WNL   Lower Extremity Exam    Side: Right  lower extremity  Side: Left lower extremity  Inspection: No masses, redness, swelling, or asymmetry. No contractures  Inspection: No masses, redness, swelling, or asymmetry. No contractures  Functional ROM: Unrestricted ROM          Functional ROM: Unrestricted ROM          Muscle strength & Tone: Functionally intact  Muscle strength & Tone: Functionally intact  Sensory: Unimpaired  Sensory: Unimpaired  Palpation: No palpable anomalies  Palpation: No palpable anomalies   Assessment  Primary Diagnosis & Pertinent Problem List: The primary encounter diagnosis was Chronic knee pain (Right). Diagnoses of Osteoarthritis of hip (Bilateral) (L>R), Chronic hip pain (Bilateral) (L>R), Chronic shoulder pain (Location of Tertiary source of pain) (Bilateral) (L>R), and Osteoarthritis of shoulder (Right) were also pertinent to this visit.  Status Diagnosis  Controlled Controlled Controlled 1. Chronic knee pain (Right)   2. Osteoarthritis of hip (Bilateral) (L>R)   3. Chronic hip pain (Bilateral) (L>R)   4. Chronic shoulder pain (Location of Tertiary source of pain) (Bilateral) (L>R)   5. Osteoarthritis of shoulder (Right)      Plan of Care  Pharmacotherapy (Medications Ordered): No orders of the defined types were placed in this encounter.  New Prescriptions   No medications on file   Medications administered today: Ms. Oshima had no medications administered during this visit. Lab-work, procedure(s), and/or referral(s): Orders Placed This Encounter  Procedures  . KNEE INJECTION  . SUPRASCAPULAR NERVE BLOCK   Imaging and/or referral(s): None  Interventional therapies: Planned, scheduled, and/or pending:   Diagnostic right intra-articular knee joint injection with local anesthetic and steroid to determine if the pain is coming from the knee itself or is referred from the back or hip area.    Considering:   Diagnostic right-sided T3-4 thoracic epidural steroid injection #1 Diagnostic  right-sided T4-5 thoracic epidural steroid injection #2 Diagnostic bilateral cervical facet block Possible bilateral cervical facet radiofrequencyablation Diagnostic left-sided cervical epidural steroid injection Diagnostic bilateral intra-articular shoulder joint injection Diagnostic bilateral suprascapular nerve block Possible bilateral suprascapular nerve radiofrequencyablation Diagnostic bilateral lumbar facet block Possible bilateral lumbar facet radiofrequencyablation  Diagnostic bilateral intra-articular hip joint injection Diagnostic bilateral femoral and obturator tickler branch blocks Possible bilateral hip joint radiofrequencyablation  Diagnosticright intra-articular knee joint injection Possible series of 5, right-sided, intra-articular knee joint injections with Hyalgan.  Possible right sided genicular nerve block Possible right sided genicular nerve radiofrequencyablation Possible left-sided lumbar epidural steroid injection   Palliative PRN treatment(s):   Diagnostic right suprascapular nerve block under fluoroscopic guidance and IV sedation    Provider-requested follow-up: Return for keep scheduled Med-Mgmt appointment, in addition, procedure (ASAP).  Future Appointments Date  Time Provider Gray  02/21/2017 8:30 AM Milinda Pointer, MD [facility] None  02/23/2017 7:30 AM [facility] [place] [facility]  03/19/2017 8:00 AM Crystal Dorrene German, NP [facility] None  04/18/2017 10:00 AM Leone Haven, MD [facility] None   Primary Care Physician: Leone Haven, MD Location: [facility] Outpatient Pain Management Facility Note by: Kathlen Brunswick. Dossie Arbour, M.D, DABA, DABAPM, DABPM, DABIPP, FIPP Date: 02/20/2017; Time: 5:27 PM  Pain Score Disclaimer: We use the NRS-11 scale. This is a self-reported, subjective measurement of pain severity with only modest accuracy. It is used primarily to identify changes within a particular patient. It must be understood that  outpatient pain scales are significantly less accurate that those used for research, where they can be applied under ideal controlled circumstances with minimal exposure to variables. In reality, the score is likely to be a combination of pain intensity and pain affect, where pain affect describes the degree of emotional arousal or changes in action readiness caused by the sensory experience of pain. Factors such as social and work situation, setting, emotional state, anxiety levels, expectation, and prior pain experience may influence pain perception and show large inter-individual differences that may also be affected by time variables.  Patient instructions provided during this appointment: Patient Instructions  Bring someone who can drive you home. You will not be able to drive for 24 hours after the procedure. Do not eat 8 hours before your procedure time.You may have clear liquids up until 3 hours before your procedure time.

## 2017-02-21 ENCOUNTER — Encounter: Payer: Self-pay | Admitting: Pain Medicine

## 2017-02-21 ENCOUNTER — Ambulatory Visit: Payer: BLUE CROSS/BLUE SHIELD | Admitting: Pain Medicine

## 2017-02-21 ENCOUNTER — Ambulatory Visit: Payer: BLUE CROSS/BLUE SHIELD | Attending: Pain Medicine | Admitting: Pain Medicine

## 2017-02-21 VITALS — BP 111/74 | HR 60 | Temp 97.3°F | Resp 16 | Ht 64.0 in | Wt 172.0 lb

## 2017-02-21 DIAGNOSIS — Z9889 Other specified postprocedural states: Secondary | ICD-10-CM | POA: Insufficient documentation

## 2017-02-21 DIAGNOSIS — Z9049 Acquired absence of other specified parts of digestive tract: Secondary | ICD-10-CM | POA: Diagnosis not present

## 2017-02-21 DIAGNOSIS — Z9851 Tubal ligation status: Secondary | ICD-10-CM | POA: Insufficient documentation

## 2017-02-21 DIAGNOSIS — M1711 Unilateral primary osteoarthritis, right knee: Secondary | ICD-10-CM | POA: Insufficient documentation

## 2017-02-21 DIAGNOSIS — Z888 Allergy status to other drugs, medicaments and biological substances status: Secondary | ICD-10-CM | POA: Diagnosis not present

## 2017-02-21 DIAGNOSIS — M25561 Pain in right knee: Secondary | ICD-10-CM

## 2017-02-21 DIAGNOSIS — G8929 Other chronic pain: Secondary | ICD-10-CM

## 2017-02-21 MED ORDER — METHYLPREDNISOLONE ACETATE 80 MG/ML IJ SUSP
80.0000 mg | Freq: Once | INTRAMUSCULAR | Status: AC
  Administered 2017-02-21: 80 mg via INTRA_ARTICULAR
  Filled 2017-02-21: qty 1

## 2017-02-21 MED ORDER — ROPIVACAINE HCL 2 MG/ML IJ SOLN
2.0000 mL | Freq: Once | INTRAMUSCULAR | Status: AC
Start: 2017-02-21 — End: 2017-02-21
  Administered 2017-02-21: 2 mL via INTRA_ARTICULAR
  Filled 2017-02-21: qty 10

## 2017-02-21 MED ORDER — MIDAZOLAM HCL 5 MG/5ML IJ SOLN
1.0000 mg | INTRAMUSCULAR | Status: DC | PRN
  Administered 2017-02-21: 2 mg via INTRAVENOUS
  Filled 2017-02-21: qty 5

## 2017-02-21 MED ORDER — LACTATED RINGERS IV SOLN
1000.0000 mL | Freq: Once | INTRAVENOUS | Status: AC
  Administered 2017-02-21: 1000 mL via INTRAVENOUS

## 2017-02-21 MED ORDER — LIDOCAINE HCL (PF) 1 % IJ SOLN
5.0000 mL | Freq: Once | INTRAMUSCULAR | Status: AC
  Administered 2017-02-21: 5 mL
  Filled 2017-02-21: qty 5

## 2017-02-21 NOTE — Progress Notes (Signed)
Safety precautions to be maintained throughout the outpatient stay will include: orient to surroundings, keep bed in low position, maintain call bell within reach at all times, provide assistance with transfer out of bed and ambulation.  

## 2017-02-21 NOTE — Patient Instructions (Signed)
Knee Injection A knee injection is a procedure to get medicine into your knee joint. Your health care provider puts a needle into the joint and injects medicine with an attached syringe. The injected medicine may relieve the pain, swelling, and stiffness of arthritis. The injected medicine may also help to lubricate and cushion your knee joint. You may need more than one injection. Tell a health care provider about:  Any allergies you have.  All medicines you are taking, including vitamins, herbs, eye drops, creams, and over-the-counter medicines.  Any problems you or family members have had with anesthetic medicines.  Any blood disorders you have.  Any surgeries you have had.  Any medical conditions you have. What are the risks? Generally, this is a safe procedure. However, problems may occur, including:  Infection.  Bleeding.  Worsening symptoms.  Damage to the area around your knee.  Allergic reaction to any of the medicines.  Skin reactions from repeated injections. What happens before the procedure?  Ask your health care provider about changing or stopping your regular medicines. This is especially important if you are taking diabetes medicines or blood thinners.  Plan to have someone take you home after the procedure. What happens during the procedure?  You will sit or lie down in a position for your knee to be treated.  The skin over your kneecap will be cleaned with a germ-killing solution (antiseptic).  You will be given a medicine that numbs the area (local anesthetic). You may feel some stinging.  After your knee becomes numb, you will have a second injection. This is the medicine. This needle is carefully placed between your kneecap and your knee. The medicine is injected into the joint space.  At the end of the procedure, the needle will be removed.  A bandage (dressing) may be placed over the injection site. The procedure may vary among health care  providers and hospitals. What happens after the procedure?  You may have to move your knee through its full range of motion. This helps to get all of the medicine into your joint space.  Your blood pressure, heart rate, breathing rate, and blood oxygen level will be monitored often until the medicines you were given have worn off.  You will be watched to make sure that you do not have a reaction to the injected medicine. This information is not intended to replace advice given to you by your health care provider. Make sure you discuss any questions you have with your health care provider. Document Released: 01/14/2007 Document Revised: 03/24/2016 Document Reviewed: 09/02/2014 Elsevier Interactive Patient Education  2017 [place]. Pain Management Discharge Instructions  General Discharge Instructions :  If you need to reach your doctor call: Monday-Friday 8:00 am - 4:00 pm at [phone] or toll free [phone].  After clinic hours [phone] to have operator reach doctor.  Bring all of your medication bottles to all your appointments in the pain clinic.  To cancel or reschedule your appointment with Pain Management please remember to call 24 hours in advance to avoid a fee.  Refer to the educational materials which you have been given on: General Risks, I had my Procedure. Discharge Instructions, Post Sedation.  Post Procedure Instructions:  The drugs you were given will stay in your system until tomorrow, so for the next 24 hours you should not drive, make any legal decisions or drink any alcoholic beverages.  You may eat anything you prefer, but it is better to start with liquids then  soups and crackers, and gradually work up to solid foods.  Please notify your doctor immediately if you have any unusual bleeding, trouble breathing or pain that is not related to your normal pain.  Depending on the type of procedure that was done, some parts of your body may feel week  and/or numb.  This usually clears up by tonight or the next day.  Walk with the use of an assistive device or accompanied by an adult for the 24 hours.  You may use ice on the affected area for the first 24 hours.  Put ice in a Ziploc bag and cover with a towel and place against area 15 minutes on 15 minutes off.  You may switch to heat after 24 hours.

## 2017-02-21 NOTE — Progress Notes (Signed)
Patient's Name: Nicole Walter  MRN: [id number]  Referring Provider: Leone Haven, MD  DOB: [birth year]  PCP: Leone Haven, MD  DOS: 02/21/2017  Note by: Kathlen Brunswick. Dossie Arbour, MD  Service setting: Ambulatory outpatient  Location: [facility] (AMB) Pain Management Facility  Visit type: Procedure  Specialty: Interventional Pain Management  Patient type: Established   Primary Reason for Visit: Interventional Pain Management Treatment. CC: Knee Pain (right)  Procedure:  Anesthesia, Analgesia, Anxiolysis:  Type: Diagnostic Intra-Articular Local anesthetic and steroid Knee Injection Region: Lateral infrapatellar Knee Region Level: Knee Joint Laterality: Right knee  Type: Local Anesthesia with Moderate (Conscious) Sedation Local Anesthetic: Lidocaine 1% Route: Intravenous (IV) IV Access: Secured Sedation: Meaningful verbal contact was maintained at all times during the procedure  Indication(s): Analgesia and Anxiety  Indications: 1. Chronic knee pain (Right)   2. Osteoarthritis of knee (Right)    Pain Score: Pre-procedure: 2 /10 Post-procedure: 0-No pain/10  Pre-op Assessment:  Previous date of service: 02/20/17 Service provided: Evaluation Nicole Walter is a 56 y.o. (year old), female patient, seen today for interventional treatment. She  has a past surgical history that includes Cholecystectomy; Tubal ligation; Ankle surgery; Nasal sinus surgery; Tonsillectomy; Colonoscopy; Esophagogastroduodenoscopy (egd) with propofol (N/A, 02/22/2016); Colonoscopy with propofol (N/A, 02/22/2016); and Breast biopsy (Left, 1997). Her primarily concern today is the Knee Pain (right)  Initial Vital Signs: Pulse 60, temperature 97.9 F (36.6 C), temperature source Oral, resp. rate 16, height 5\' 4"  (1.626 m), weight 172 lb (78 kg), SpO2 97 %. BMI: 29.52 kg/m  Risk Assessment: Allergies: Reviewed. She is allergic to other and food.  Allergy Precautions: None required Coagulopathies: "Reviewed. None  identified.  Blood-thinner therapy: None at this time Active Infection(s): Reviewed. None identified. Nicole Walter is afebrile  Site Confirmation: Nicole Walter was asked to confirm the procedure and laterality before marking the site Procedure checklist: Completed Consent: Before the procedure and under the influence of no sedative(s), amnesic(s), or anxiolytics, the patient was informed of the treatment options, risks and possible complications. To fulfill our ethical and legal obligations, as recommended by the American Medical Association's Code of Ethics, I have informed the patient of my clinical impression; the nature and purpose of the treatment or procedure; the risks, benefits, and possible complications of the intervention; the alternatives, including doing nothing; the risk(s) and benefit(s) of the alternative treatment(s) or procedure(s); and the risk(s) and benefit(s) of doing nothing. The patient was provided information about the general risks and possible complications associated with the procedure. These may include, but are not limited to: failure to achieve desired goals, infection, bleeding, organ or nerve damage, allergic reactions, paralysis, and death. In addition, the patient was informed of those risks and complications associated to the procedure, such as failure to decrease pain; infection; bleeding; organ or nerve damage with subsequent damage to sensory, motor, and/or autonomic systems, resulting in permanent pain, numbness, and/or weakness of one or several areas of the body; allergic reactions; (i.e.: anaphylactic reaction); and/or death. Furthermore, the patient was informed of those risks and complications associated with the medications. These include, but are not limited to: allergic reactions (i.e.: anaphylactic or anaphylactoid reaction(s)); adrenal axis suppression; blood sugar elevation that in diabetics may result in ketoacidosis or comma; water retention that in patients  with history of congestive heart failure may result in shortness of breath, pulmonary edema, and decompensation with resultant heart failure; weight gain; swelling or edema; medication-induced neural toxicity; particulate matter embolism and blood vessel occlusion with resultant organ, and/or  nervous system infarction; and/or aseptic necrosis of one or more joints. Finally, the patient was informed that Medicine is not an exact science; therefore, there is also the possibility of unforeseen or unpredictable risks and/or possible complications that may result in a catastrophic outcome. The patient indicated having understood very clearly. We have given the patient no guarantees and we have made no promises. Enough time was given to the patient to ask questions, all of which were answered to the patient's satisfaction. Nicole Walter has indicated that she wanted to continue with the procedure. Attestation: I, the ordering provider, attest that I have discussed with the patient the benefits, risks, side-effects, alternatives, likelihood of achieving goals, and potential problems during recovery for the procedure that I have provided informed consent. Date: 02/21/2017; Time: 8:39 AM  Pre-Procedure Preparation:  Monitoring: As per clinic protocol. Respiration, ETCO2, SpO2, BP, heart rate and rhythm monitor placed and checked for adequate function Safety Precautions: Patient was assessed for positional comfort and pressure points before starting the procedure. Time-out: I initiated and conducted the "Time-out" before starting the procedure, as per protocol. The patient was asked to participate by confirming the accuracy of the "Time Out" information. Verification of the correct person, site, and procedure were performed and confirmed by me, the nursing staff, and the patient. "Time-out" conducted as per Joint Commission's Universal Protocol (UP.01.01.01). "Time-out" Date & Time: 02/21/2017; 0909 hrs.  Description of  Procedure Process:   Position: Sitting Target Area: Knee Joint Approach: Just above the Lateral tibial plateau, lateral to the infrapatellar tendon. Area Prepped: Entire knee area, from the mid-thigh to the mid-shin. Prepping solution: ChloraPrep (2% chlorhexidine gluconate and 70% isopropyl alcohol) Safety Precautions: Aspiration looking for blood return was conducted prior to all injections. At no point did we inject any substances, as a needle was being advanced. No attempts were made at seeking any paresthesias. Safe injection practices and needle disposal techniques used. Medications properly checked for expiration dates. SDV (single dose vial) medications used. Description of the Procedure: Protocol guidelines were followed. The patient was placed in position over the fluoroscopy table. The target area was identified and the area prepped in the usual manner. Skin desensitized using vapocoolant spray. Skin & deeper tissues infiltrated with local anesthetic. Appropriate amount of time allowed to pass for local anesthetics to take effect. The procedure needles were then advanced to the target area. Proper needle placement secured. Negative aspiration confirmed. Solution injected in intermittent fashion, asking for systemic symptoms every 0.5cc of injectate. The needles were then removed and the area cleansed, making sure to leave some of the prepping solution back to take advantage of its long term bactericidal properties. Vitals:   02/21/17 0913 02/21/17 0918 02/21/17 0928 02/21/17 0937  BP: 121/74 118/85 110/76 111/74  Pulse:      Resp: 11 12 14 16   Temp:  97.3 F (36.3 C)    TempSrc:  Tympanic    SpO2: 98% 94% 95% 96%  Weight:      Height:        Start Time: 0909 hrs. End Time: 0912 hrs. Materials:  Needle(s) Type: Regular needle Gauge: 22G Length: 3.5-in Medication(s): We administered methylPREDNISolone acetate, lidocaine (PF), ropivacaine (PF) 2 mg/mL (0.2%), midazolam, and lactated  ringers. Please see chart orders for dosing details.  Imaging Guidance:  Type of Imaging Technique: None used Indication(s): N/A Exposure Time: No patient exposure Contrast: None used. Fluoroscopic Guidance: N/A Ultrasound Guidance: N/A Interpretation: N/A  Antibiotic Prophylaxis:  Indication(s): None identified Antibiotic  given: None  Post-operative Assessment:  EBL: None Complications: No immediate post-treatment complications observed by team, or reported by patient. Note: The patient tolerated the entire procedure well. A repeat set of vitals were taken after the procedure and the patient was kept under observation following institutional policy, for this type of procedure. Post-procedural neurological assessment was performed, showing return to baseline, prior to discharge. The patient was provided with post-procedure discharge instructions, including a section on how to identify potential problems. Should any problems arise concerning this procedure, the patient was given instructions to immediately contact us, at any time, without hesitation. In any case, we plan to contact the patient by telephone for a follow-up status report regarding this interventional procedure. Comments:  No additional relevant information.  Plan of Care  Disposition: Discharge home  Discharge Date & Time: 02/21/2017; 0938 hrs.  Physician-requested Follow-up:  Return in about 2 weeks (around 03/07/2017) for Post-Procedure evaluation.  Future Appointments Date Time Provider [place]  02/23/2017 7:30 AM [facility] [place] [facility]  03/19/2017 8:00 AM Crystal Dorrene German, NP [facility] None  03/21/2017 1:15 PM Milinda Pointer, MD [facility] None  04/18/2017 10:00 AM Leone Haven, MD [facility] None   Medications ordered for procedure: Meds ordered this encounter  Medications  . methylPREDNISolone acetate (DEPO-MEDROL) injection 80 mg  . lidocaine (PF) (XYLOCAINE) 1 % injection 5 mL  .  ropivacaine (PF) 2 mg/mL (0.2%) (NAROPIN) injection 2 mL  . midazolam (VERSED) 5 MG/5ML injection 1-2 mg    Make sure Flumazenil is available in the pyxis when using this medication. If oversedation occurs, administer 0.2 mg IV over 15 sec. If after 45 sec no response, administer 0.2 mg again over 1 min; may repeat at 1 min intervals; not to exceed 4 doses (1 mg)  . lactated ringers infusion 1,000 mL   Medications administered: We administered methylPREDNISolone acetate, lidocaine (PF), ropivacaine (PF) 2 mg/mL (0.2%), midazolam, and lactated ringers.  See the medical record for exact dosing, route, and time of administration.  Lab-work, Procedure(s), & Referral(s) Ordered: Orders Placed This Encounter  Procedures  . KNEE INJECTION  . Discharge instructions  . Follow-up  . Provider attestation of informed consent for procedure/surgical case  . Verify informed consent  . Informed Consent Details: Transcribe to consent form and obtain patient signature   Imaging Ordered: Results for orders placed in visit on 01/31/17  DG C-Arm 1-60 Min-No Report   Narrative Fluoroscopy was utilized by the requesting physician.  No radiographic  interpretation.    New Prescriptions   No medications on file   Primary Care Physician: Leone Haven, MD Location: [facility] Out[facility] Management Facility Note by: Kathlen Brunswick. Dossie Arbour, M.D, DABA, DABAPM, DABPM, DABIPP, FIPP Date: 02/21/2017; Time: 12:52 PM  Disclaimer:  Medicine is not an exact science. The only guarantee in medicine is that nothing is guaranteed. It is important to note that the decision to proceed with this intervention was based on the information collected from the patient. The Data and conclusions were drawn from the patient's questionnaire, the interview, and the physical examination. Because the information was provided in large part by the patient, it cannot be guaranteed that it has not been purposely or unconsciously manipulated.  Every effort has been made to obtain as much relevant data as possible for this evaluation. It is important to note that the conclusions that lead to this procedure are derived in large part from the available data. Always take into account that the treatment  will also be dependent on availability of resources and existing treatment guidelines, considered by other Pain Management Practitioners as being common knowledge and practice, at the time of the intervention. For Medico-Legal purposes, it is also important to point out that variation in procedural techniques and pharmacological choices are the acceptable norm. The indications, contraindications, technique, and results of the above procedure should only be interpreted and judged by a Board-Certified Interventional Pain Specialist with extensive familiarity and expertise in the same exact procedure and technique. Attempts at providing opinions without similar or greater experience and expertise than that of the treating physician will be considered as inappropriate and unethical, and shall result in a formal complaint to the state medical board and applicable specialty societies.  Instructions provided at this appointment: Patient Instructions  Knee Injection A knee injection is a procedure to get medicine into your knee joint. Your health care provider puts a needle into the joint and injects medicine with an attached syringe. The injected medicine may relieve the pain, swelling, and stiffness of arthritis. The injected medicine may also help to lubricate and cushion your knee joint. You may need more than one injection. Tell a health care provider about:  Any allergies you have.  All medicines you are taking, including vitamins, herbs, eye drops, creams, and over-the-counter medicines.  Any problems you or family members have had with anesthetic medicines.  Any blood disorders you have.  Any surgeries you have had.  Any medical conditions you  have. What are the risks? Generally, this is a safe procedure. However, problems may occur, including:  Infection.  Bleeding.  Worsening symptoms.  Damage to the area around your knee.  Allergic reaction to any of the medicines.  Skin reactions from repeated injections. What happens before the procedure?  Ask your health care provider about changing or stopping your regular medicines. This is especially important if you are taking diabetes medicines or blood thinners.  Plan to have someone take you home after the procedure. What happens during the procedure?  You will sit or lie down in a position for your knee to be treated.  The skin over your kneecap will be cleaned with a germ-killing solution (antiseptic).  You will be given a medicine that numbs the area (local anesthetic). You may feel some stinging.  After your knee becomes numb, you will have a second injection. This is the medicine. This needle is carefully placed between your kneecap and your knee. The medicine is injected into the joint space.  At the end of the procedure, the needle will be removed.  A bandage (dressing) may be placed over the injection site. The procedure may vary among health care providers and hospitals. What happens after the procedure?  You may have to move your knee through its full range of motion. This helps to get all of the medicine into your joint space.  Your blood pressure, heart rate, breathing rate, and blood oxygen level will be monitored often until the medicines you were given have worn off.  You will be watched to make sure that you do not have a reaction to the injected medicine. This information is not intended to replace advice given to you by your health care provider. Make sure you discuss any questions you have with your health care provider. Document Released: 01/14/2007 Document Revised: 03/24/2016 Document Reviewed: 09/02/2014 Elsevier Interactive Patient Education   2017 [place]. Pain Management Discharge Instructions  General Discharge Instructions :  If you need to reach  your doctor call: Monday-Friday 8:00 am - 4:00 pm at [phone] or toll free [phone].  After clinic hours [phone] to have operator reach doctor.  Bring all of your medication bottles to all your appointments in the pain clinic.  To cancel or reschedule your appointment with Pain Management please remember to call 24 hours in advance to avoid a fee.  Refer to the educational materials which you have been given on: General Risks, I had my Procedure. Discharge Instructions, Post Sedation.  Post Procedure Instructions:  The drugs you were given will stay in your system until tomorrow, so for the next 24 hours you should not drive, make any legal decisions or drink any alcoholic beverages.  You may eat anything you prefer, but it is better to start with liquids then soups and crackers, and gradually work up to solid foods.  Please notify your doctor immediately if you have any unusual bleeding, trouble breathing or pain that is not related to your normal pain.  Depending on the type of procedure that was done, some parts of your body may feel week and/or numb.  This usually clears up by tonight or the next day.  Walk with the use of an assistive device or accompanied by an adult for the 24 hours.  You may use ice on the affected area for the first 24 hours.  Put ice in a Ziploc bag and cover with a towel and place against area 15 minutes on 15 minutes off.  You may switch to heat after 24 hours.

## 2017-02-22 ENCOUNTER — Telehealth: Payer: Self-pay

## 2017-02-22 NOTE — Telephone Encounter (Signed)
Post procedure phone call.  States she is doing OK.  Feels pressure in knee.  States pain is gone.  Instructed her to put heat on it today and call us for any further concerns or questions.

## 2017-02-23 ENCOUNTER — Ambulatory Visit (INDEPENDENT_AMBULATORY_CARE_PROVIDER_SITE_OTHER): Payer: BLUE CROSS/BLUE SHIELD

## 2017-02-23 ENCOUNTER — Other Ambulatory Visit: Payer: Self-pay

## 2017-02-23 DIAGNOSIS — R0602 Shortness of breath: Secondary | ICD-10-CM

## 2017-02-23 DIAGNOSIS — R079 Chest pain, unspecified: Secondary | ICD-10-CM

## 2017-02-28 ENCOUNTER — Telehealth: Payer: Self-pay | Admitting: *Deleted

## 2017-02-28 DIAGNOSIS — I313 Pericardial effusion (noninflammatory): Secondary | ICD-10-CM

## 2017-02-28 DIAGNOSIS — I3139 Other pericardial effusion (noninflammatory): Secondary | ICD-10-CM

## 2017-02-28 NOTE — Telephone Encounter (Signed)
-----   Message from Wende Bushy, MD sent at 02/27/2017 10:48 AM EDT ----- Overall ef ok Trivial fluid in pericardium ,rec rpt in 4-6 mo (limited echo)

## 2017-02-28 NOTE — Telephone Encounter (Signed)
Spoke with patient and reviewed results and recommendations. Let her know that someone would call to schedule this when it is available. She was agreeable to plan and had no further questions.

## 2017-03-06 ENCOUNTER — Telehealth: Payer: Self-pay | Admitting: Pain Medicine

## 2017-03-06 NOTE — Telephone Encounter (Signed)
Spoke with patient re; anxiety preprocedure and whether insurance will cover sedation for procedure.  Explained to patient that we do not prescribe for sedation and if she has severe anxiety she needs to see her PCP for that.  States that she does see a psychiatrist which prescribes Vistaril which does nothing for her. Also, spoke with [employer] and [employer] does not know if her insurance will cover sedation for sure and she will need to call her insurance for verification of coverage.  Patient verbalizes u/o information.

## 2017-03-06 NOTE — Telephone Encounter (Signed)
Patient has severe anxiety and has appt coming up May 9. Wants to know if she can get something to help with that instead of sedation? If not she will need sedation.

## 2017-03-14 ENCOUNTER — Ambulatory Visit: Admission: RE | Admit: 2017-03-14 | Payer: BLUE CROSS/BLUE SHIELD | Source: Ambulatory Visit

## 2017-03-14 ENCOUNTER — Ambulatory Visit: Payer: BLUE CROSS/BLUE SHIELD | Attending: Pain Medicine | Admitting: Pain Medicine

## 2017-03-14 ENCOUNTER — Encounter: Payer: Self-pay | Admitting: Pain Medicine

## 2017-03-14 VITALS — BP 112/81 | HR 59 | Temp 97.3°F | Resp 17 | Ht 64.0 in | Wt 170.0 lb

## 2017-03-14 DIAGNOSIS — G8929 Other chronic pain: Secondary | ICD-10-CM | POA: Diagnosis not present

## 2017-03-14 DIAGNOSIS — M25561 Pain in right knee: Secondary | ICD-10-CM

## 2017-03-14 DIAGNOSIS — M1711 Unilateral primary osteoarthritis, right knee: Secondary | ICD-10-CM | POA: Diagnosis not present

## 2017-03-14 MED ORDER — LIDOCAINE HCL (PF) 1 % IJ SOLN
5.0000 mL | Freq: Once | INTRAMUSCULAR | Status: AC
  Administered 2017-03-14: 5 mL
  Filled 2017-03-14: qty 5

## 2017-03-14 MED ORDER — ROPIVACAINE HCL 2 MG/ML IJ SOLN
5.0000 mL | Freq: Once | INTRAMUSCULAR | Status: AC
  Administered 2017-03-14: 10 mL via INTRA_ARTICULAR
  Filled 2017-03-14: qty 10

## 2017-03-14 MED ORDER — SODIUM HYALURONATE (VISCOSUP) 20 MG/2ML IX SOSY
2.0000 mL | PREFILLED_SYRINGE | Freq: Once | INTRA_ARTICULAR | Status: AC
  Administered 2017-03-14: 2 mL via INTRA_ARTICULAR

## 2017-03-14 MED ORDER — FENTANYL CITRATE (PF) 100 MCG/2ML IJ SOLN
25.0000 ug | INTRAMUSCULAR | Status: DC | PRN
  Administered 2017-03-14: 50 ug via INTRAVENOUS
  Filled 2017-03-14: qty 2

## 2017-03-14 MED ORDER — LACTATED RINGERS IV SOLN
1000.0000 mL | Freq: Once | INTRAVENOUS | Status: AC
  Administered 2017-03-14: 1000 mL via INTRAVENOUS

## 2017-03-14 MED ORDER — MIDAZOLAM HCL 5 MG/5ML IJ SOLN
1.0000 mg | INTRAMUSCULAR | Status: DC | PRN
  Administered 2017-03-14: 2 mg via INTRAVENOUS
  Filled 2017-03-14: qty 5

## 2017-03-14 NOTE — Patient Instructions (Addendum)
Post-Procedure instructions Instructions:  Apply ice: Fill a plastic sandwich bag with crushed ice. Cover it with a small towel and apply to injection site. Apply for 15 minutes then remove x 15 minutes. Repeat sequence on day of procedure, until you go to bed. The purpose is to minimize swelling and discomfort after procedure.  Apply heat: Apply heat to procedure site starting the day following the procedure. The purpose is to treat any soreness and discomfort from the procedure.  Food intake: Start with clear liquids (like water) and advance to regular food, as tolerated.   Physical activities: Keep activities to a minimum for the first 8 hours after the procedure.   Driving: If you have received any sedation, you are not allowed to drive for 24 hours after your procedure.  Blood thinner: Restart your blood thinner 6 hours after your procedure. (Only for those taking blood thinners)  Insulin: As soon as you can eat, you may resume your normal dosing schedule. (Only for those taking insulin)  Infection prevention: Keep procedure site clean and dry.  Post-procedure Pain Diary: Extremely important that this be done correctly and accurately. Recorded information will be used to determine the next step in treatment.  Pain evaluated is that of treated area only. Do not include pain from an untreated area.  Complete every hour, on the hour, for the initial 8 hours. Set an alarm to help you do this part accurately.  Do not go to sleep and have it completed later. It will not be accurate.  Follow-up appointment: Keep your follow-up appointment after the procedure. Usually 2 weeks for most procedures. (6 weeks in the case of radiofrequency.) Bring you pain diary.  Expect:  From numbing medicine (AKA: Local Anesthetics): Numbness or decrease in pain.  Onset: Full effect within 15 minutes of injected.  Duration: It will depend on the type of local anesthetic used. On the average, 1 to 8  hours.   From steroids: Decrease in swelling or inflammation. Once inflammation is improved, relief of the pain will follow.  Onset of benefits: Depends on the amount of swelling present. The more swelling, the longer it will take for the benefits to be seen.   Duration: Steroids will stay in the system x 2 weeks. Duration of benefits will depend on multiple posibilities including persistent irritating factors.  From procedure: Some discomfort is to be expected once the numbing medicine wears off. This should be minimal if ice and heat are applied as instructed. Call if:  You experience numbness and weakness that gets worse with time, as opposed to wearing off.  New onset bowel or bladder incontinence. (Spinal procedures only)  Emergency Numbers:  Durning business hours (Monday - Thursday, 8:00 AM - 4:00 PM) (Friday, 9:00 AM - 12:00 Noon): [phone]  After hours: [phone] _____________________________________________________________________________________________  _______________________________________________________________  Preparing for Procedure with Sedation Instructions: . Oral Intake: Do not eat or drink anything for at least 8 hours prior to your procedure. . Transportation: Public transportation is not allowed. Bring an adult driver. The driver must be physically present in our waiting room before any procedure can be started. [place] Physical Assistance: Bring an adult physically capable of assisting you, in the event you need help. This adult should keep you company at home for at least 6 hours after the procedure. . Blood Pressure Medicine: Take your blood pressure medicine with a sip of water the morning of the procedure. . Blood thinners:  . Diabetics on insulin: Notify the  staff so that you can be scheduled 1st case in the morning. If your diabetes requires high dose insulin, take only  of your normal insulin dose the morning of the procedure and notify the  staff that you have done so. . Preventing infections: Shower with an antibacterial soap the morning of your procedure. . Build-up your immune system: Take 1000 mg of Vitamin C with every meal (3 times a day) the day prior to your procedure. [place] Kitchen Antibiotics: Inform the staff if you have a condition or reason that requires you to take antibiotics before dental procedures. . Pregnancy: If you are pregnant, call and cancel the procedure. . Sickness: If you have a cold, fever, or any active infections, call and cancel the procedure. . Arrival: You must be in the facility at least 30 minutes prior to your scheduled procedure. . Children: Do not bring children with you. . Dress appropriately: Bring dark clothing that you would not mind if they get stained. . Valuables: Do not bring any jewelry or valuables. Procedure appointments are reserved for interventional treatments only. Marland Kitchen No Prescription Refills. . No medication changes will be discussed during procedure appointments. . No disability issues will be discussed. ______________________________________________________________________________________________  ____________________________________________________________________________________________  Medication Rules  Applies to: All patients receiving prescriptions (written or electronic).  Pharmacy of record: Pharmacy where electronic prescriptions will be sent. If written prescriptions are taken to a different pharmacy, please inform the nursing staff. The pharmacy listed in the electronic medical record should be the one where you would like electronic prescriptions to be sent.  Prescription refills: Only during scheduled appointments. Applies to both, written and electronic prescriptions.  NOTE: The following applies primarily to controlled substances (Opioid Pain Medications)  Patient's responsibilities: 1. Pain Pills: Bring all pain pills to every appointment (except for procedure  appointments). 2. Pill Bottles: Bring pills in original pharmacy bottle. Always bring newest bottle. Bring bottle, even if empty. 3. Medication refills: You are responsible for knowing and keeping track of what medications you need refilled. The day before your appointment, write a list of all prescriptions that need to be refilled. Bring that list to your appointment and give it to the admitting nurse. Prescriptions will be written only during appointments. If you forget a medication, it will not be "Called in", "Faxed", or "electronically sent". You will need to get another appointment to get these prescribed. 4. Prescription Accuracy: You are responsible for carefully inspecting your prescriptions before leaving our office. Have the discharge nurse carefully go over each prescription with you, before taking them home. Make sure that your name is accurately spelled, that your address is correct. Check the name and dose of your medication to make sure it is accurate. Check the number of pills, and the written instructions to make sure they are clear and accurate. Make sure that you are given enough medication to last until your next medication refill appointment. 5. Taking Medication: Take medication as prescribed. Never take more pills than instructed. Never take medication more frequently than prescribed. Taking less pills or less frequently is permitted and encouraged, when it comes to controlled substances (written prescriptions).  6. Inform other Doctors: Always inform, all of your healthcare providers, of all the medications you take. 7. Pain Medication from other Providers: You are not allowed to accept any additional pain medication from any other Doctor or Healthcare provider. There are two exceptions to this rule. (see below) In the event that you require additional pain medication, you are responsible for  notifying us, as stated below. 8. Medication Agreement: You are responsible for carefully  reading and following our Medication Agreement. This must be signed before receiving any prescriptions from our practice. Safely store a copy of your signed Agreement. Violations to the Agreement will result in no further prescriptions. (Additional copies of our Medication Agreement are available upon request.) 9. Laws, Rules, & Regulations: All patients are expected to follow all Federal and [employer], Rules, [employer]. Ignorance of the Laws does not constitute a valid excuse.  Exceptions: There are only two exceptions to the rule of not receiving pain medications from other Healthcare Providers. 1. Exception #1 (Emergencies): In the event of an emergency (i.e.: accident requiring emergency care), you are allowed to receive additional pain medication. However, you are responsible for: As soon as you are able, call our office [phone], at any time of the day or night, and leave a message stating your name, the date and nature of the emergency, and the name and dose of the medication prescribed. In the event that your call is answered by a member of our staff, make sure to document and save the date, time, and the name of the person that took your information.  2. Exception #2 (Planned Surgery): In the event that you are scheduled by another doctor or dentist to have any type of surgery or procedure, you are allowed (for a period no longer than 30 days), to receive additional pain medication, for the acute post-op pain. However, in this case, you are responsible for picking up a copy of our "Post-op Pain Management for Surgeons" handout, and giving it to your surgeon or dentist. This document is available at our office, and does not require an appointment to obtain it. Simply go to our office during business hours (Monday-Thursday from 8:00 AM to 4:00 PM) (Friday 8:00 AM to 12:00 Noon) or if you have a scheduled appointment with [place], prior to your surgery, and ask for it by name. In addition,  you will need to provide [place] with your name, name of your surgeon, type of surgery, and date of procedure or surgery.  _____________________________________________________________________________________________Pain Score  Introduction: The pain score used by this practice is the Verbal Numerical Rating Scale (VNRS-11). This is an 11-point scale. It is for adults and children 10 years or older. There are significant differences in how the pain score is reported, used, and applied. Forget everything you learned in the past and learn this scoring system.  General Information: The scale should reflect your current level of pain. Unless you are specifically asked for the level of your worst pain, or your average pain. If you are asked for one of these two, then it should be understood that it is over the past 24 hours.  Basic Activities of Daily Living (ADL): Personal hygiene, dressing, eating, transferring, and using restroom.  Instructions: Most patients tend to report their level of pain as a combination of two factors, their physical pain and their psychosocial pain. This last one is also known as "suffering" and it is reflection of how physical pain affects you socially and psychologically. From now on, report them separately. From this point on, when asked to report your pain level, report only your physical pain. Use the following table for reference.  Pain Clinic Pain Levels (0-5/10)  Pain Level Score Description  No Pain 0   Mild pain 1 Nagging, annoying, but does not interfere with basic activities of daily living (ADL). Patients are  able to eat, bathe, get dressed, toileting (being able to get on and off the toilet and perform personal hygiene functions), transfer (move in and out of bed or a chair without assistance), and maintain continence (able to control bladder and bowel functions). Blood pressure and heart rate are unaffected. A normal heart rate for a healthy adult ranges from 60 to 100  bpm (beats per minute).   Mild to moderate pain 2 Noticeable and distracting. Impossible to hide from other people. More frequent flare-ups. Still possible to adapt and function close to normal. It can be very annoying and may have occasional stronger flare-ups. With discipline, patients may get used to it and adapt.   Moderate pain 3 Interferes significantly with activities of daily living (ADL). It becomes difficult to feed, bathe, get dressed, get on and off the toilet or to perform personal hygiene functions. Difficult to get in and out of bed or a chair without assistance. Very distracting. With effort, it can be ignored when deeply involved in activities.   Moderately severe pain 4 Impossible to ignore for more than a few minutes. With effort, patients may still be able to manage work or participate in some social activities. Very difficult to concentrate. Signs of autonomic nervous system discharge are evident: dilated pupils (mydriasis); mild sweating (diaphoresis); sleep interference. Heart rate becomes elevated (>115 bpm). Diastolic blood pressure (lower number) rises above 100 mmHg. Patients find relief in laying down and not moving.   Severe pain 5 Intense and extremely unpleasant. Associated with frowning face and frequent crying. Pain overwhelms the senses.  Ability to do any activity or maintain social relationships becomes significantly limited. Conversation becomes difficult. Pacing back and forth is common, as getting into a comfortable position is nearly impossible. Pain wakes you up from deep sleep. Physical signs will be obvious: pupillary dilation; increased sweating; goosebumps; brisk reflexes; cold, clammy hands and feet; nausea, vomiting or dry heaves; loss of appetite; significant sleep disturbance with inability to fall asleep or to remain asleep. When persistent, significant weight loss is observed due to the complete loss of appetite and sleep deprivation.  Blood pressure and  heart rate becomes significantly elevated. Caution: If elevated blood pressure triggers a pounding headache associated with blurred vision, then the patient should immediately seek attention at an urgent or emergency care unit, as these may be signs of an impending stroke.    Emergency Department Pain Levels (6-10/10)  Emergency Room Pain 6 Severely limiting. Requires emergency care and should not be seen or managed at an outpatient pain management facility. Communication becomes difficult and requires great effort. Assistance to reach the emergency department may be required. Facial flushing and profuse sweating along with potentially dangerous increases in heart rate and blood pressure will be evident.   Distressing pain 7 Self-care is very difficult. Assistance is required to transport, or use restroom. Assistance to reach the emergency department will be required. Tasks requiring coordination, such as bathing and getting dressed become very difficult.   Disabling pain 8 Self-care is no longer possible. At this level, pain is disabling. The individual is unable to do even the most "basic" activities such as walking, eating, bathing, dressing, transferring to a bed, or toileting. Fine motor skills are lost. It is difficult to think clearly.   Incapacitating pain 9 Pain becomes incapacitating. Thought processing is no longer possible. Difficult to remember your own name. Control of movement and coordination are lost.   The worst pain imaginable 10 At this level,  most patients pass out from pain. When this level is reached, collapse of the autonomic nervous system occurs, leading to a sudden drop in blood pressure and heart rate. This in turn results in a temporary and dramatic drop in blood flow to the brain, leading to a loss of consciousness. Fainting is one of the body's self defense mechanisms. Passing out puts the brain in a calmed state and causes it to shut down for a while, in order to begin  the healing process.    Summary: 1. Refer to this scale when providing [place] with your pain level. 2. Be accurate and careful when reporting your pain level. This will help with your care. 3. Over-reporting your pain level will lead to loss of credibility. 4. Even a level of 1/10 means that there is pain and will be treated at our facility. 5. High, inaccurate reporting will be documented as "Symptom Exaggeration", leading to loss of credibility and suspicions of possible secondary gains such as obtaining more narcotics, or wanting to appear disabled, for fraudulent reasons. 6. Only pain levels of 5 or below will be seen at our facility. 7. Pain levels of 6 and above will be sent to the Emergency Department and the appointment cancelled. _____________________________________________________________________________________________  Pain Management Discharge Instructions  General Discharge Instructions :  If you need to reach your doctor call: Monday-Friday 8:00 am - 4:00 pm at [phone] or toll free [phone].  After clinic hours [phone] to have operator reach doctor.  Bring all of your medication bottles to all your appointments in the pain clinic.  To cancel or reschedule your appointment with Pain Management please remember to call 24 hours in advance to avoid a fee.  Refer to the educational materials which you have been given on: General Risks, I had my Procedure. Discharge Instructions, Post Sedation.  Post Procedure Instructions:  The drugs you were given will stay in your system until tomorrow, so for the next 24 hours you should not drive, make any legal decisions or drink any alcoholic beverages.  You may eat anything you prefer, but it is better to start with liquids then soups and crackers, and gradually work up to solid foods.  Please notify your doctor immediately if you have any unusual bleeding, trouble breathing or pain that is not related to your normal  pain.  Depending on the type of procedure that was done, some parts of your body may feel week and/or numb.  This usually clears up by tonight or the next day.  Walk with the use of an assistive device or accompanied by an adult for the 24 hours.  You may use ice on the affected area for the first 24 hours.  Put ice in a Ziploc bag and cover with a towel and place against area 15 minutes on 15 minutes off.  You may switch to heat after 24 hours.

## 2017-03-14 NOTE — Progress Notes (Signed)
Safety precautions to be maintained throughout the outpatient stay will include: orient to surroundings, keep bed in low position, maintain call bell within reach at all times, provide assistance with transfer out of bed and ambulation.  

## 2017-03-14 NOTE — Progress Notes (Signed)
Patient's Name: Nicole Walter  MRN: [id number]  Referring Provider: Leone Haven, MD  DOB: 1961/04/08  PCP: Leone Haven, MD  DOS: 03/14/2017  Note by: Kathlen Brunswick. Dossie Arbour, MD  Service setting: Ambulatory outpatient  Location: [facility] (AMB) Pain Management Facility  Visit type: Procedure  Specialty: Interventional Pain Management  Patient type: Established   Primary Reason for Visit: Interventional Pain Management Treatment. CC: Knee Pain (right)  Procedure:  Anesthesia, Analgesia, Anxiolysis:  Type: Therapeutic Intra-Articular Hyalgan Knee Injection #1 Region: Lateral infrapatellar Knee Region Level: Knee Joint Laterality: Right knee  Type: Local Anesthesia with Moderate (Conscious) Sedation Local Anesthetic: Lidocaine 1% Route: Intravenous (IV) IV Access: Secured Sedation: Meaningful verbal contact was maintained at all times during the procedure  Indication(s): Analgesia and Anxiety  Indications: 1. Chronic knee pain (Right)   2. Osteoarthritis of knee (Right)   3. Chronic knee arthropathy (Right)    Pain Score: Pre-procedure: 1 /10 Post-procedure: 0-No pain/10  Pre-op Assessment:  Previous date of service: 02/21/17 Service provided: Procedure Nicole Walter is a 56 y.o. (year old), female patient, seen today for interventional treatment. She  has a past surgical history that includes Cholecystectomy; Tubal ligation; Ankle surgery; Nasal sinus surgery; Tonsillectomy; Colonoscopy; Esophagogastroduodenoscopy (egd) with propofol (N/A, 02/22/2016); Colonoscopy with propofol (N/A, 02/22/2016); and Breast biopsy (Left, 1997). Her primarily concern today is the Knee Pain (right)  Initial Vital Signs: Blood pressure 136/80, pulse (!) 59, temperature 98.1 F (36.7 C), temperature source Oral, resp. rate 16, height 5\' 4"  (1.626 m), weight 170 lb (77.1 kg), SpO2 100 %. BMI: 29.18 kg/m  Risk Assessment: Allergies: Reviewed. She is allergic to other and food.  Allergy Precautions: None  required Coagulopathies: Reviewed. None identified.  Blood-thinner therapy: None at this time Active Infection(s): Reviewed. None identified. Nicole Walter is afebrile  Site Confirmation: Nicole Walter was asked to confirm the procedure and laterality before marking the site Procedure checklist: Completed Consent: Before the procedure and under the influence of no sedative(s), amnesic(s), or anxiolytics, the patient was informed of the treatment options, risks and possible complications. To fulfill our ethical and legal obligations, as recommended by the American Medical Association's Code of Ethics, I have informed the patient of my clinical impression; the nature and purpose of the treatment or procedure; the risks, benefits, and possible complications of the intervention; the alternatives, including doing nothing; the risk(s) and benefit(s) of the alternative treatment(s) or procedure(s); and the risk(s) and benefit(s) of doing nothing. The patient was provided information about the general risks and possible complications associated with the procedure. These may include, but are not limited to: failure to achieve desired goals, infection, bleeding, organ or nerve damage, allergic reactions, paralysis, and death. In addition, the patient was informed of those risks and complications associated to the procedure, such as failure to decrease pain; infection; bleeding; organ or nerve damage with subsequent damage to sensory, motor, and/or autonomic systems, resulting in permanent pain, numbness, and/or weakness of one or several areas of the body; allergic reactions; (i.e.: anaphylactic reaction); and/or death. Furthermore, the patient was informed of those risks and complications associated with the medications. These include, but are not limited to: allergic reactions (i.e.: anaphylactic or anaphylactoid reaction(s)); adrenal axis suppression; blood sugar elevation that in diabetics may result in ketoacidosis or  comma; water retention that in patients with history of congestive heart failure may result in shortness of breath, pulmonary edema, and decompensation with resultant heart failure; weight gain; swelling or edema; medication-induced neural toxicity; particulate matter  embolism and blood vessel occlusion with resultant organ, and/or nervous system infarction; and/or aseptic necrosis of one or more joints. Finally, the patient was informed that Medicine is not an exact science; therefore, there is also the possibility of unforeseen or unpredictable risks and/or possible complications that may result in a catastrophic outcome. The patient indicated having understood very clearly. We have given the patient no guarantees and we have made no promises. Enough time was given to the patient to ask questions, all of which were answered to the patient's satisfaction. Nicole Walter has indicated that she wanted to continue with the procedure. Attestation: I, the ordering provider, attest that I have discussed with the patient the benefits, risks, side-effects, alternatives, likelihood of achieving goals, and potential problems during recovery for the procedure that I have provided informed consent. Date: 03/14/2017; Time: 10:56 AM  Pre-Procedure Preparation:  Monitoring: As per clinic protocol. Respiration, ETCO2, SpO2, BP, heart rate and rhythm monitor placed and checked for adequate function Safety Precautions: Patient was assessed for positional comfort and pressure points before starting the procedure. Time-out: I initiated and conducted the "Time-out" before starting the procedure, as per protocol. The patient was asked to participate by confirming the accuracy of the "Time Out" information. Verification of the correct person, site, and procedure were performed and confirmed by me, the nursing staff, and the patient. "Time-out" conducted as per Joint Commission's Universal Protocol (UP.01.01.01). "Time-out" Date & Time:  03/14/2017; 1120 hrs.  Description of Procedure Process:   Position: Sitting Target Area: Knee Joint Approach: Just above the Lateral tibial plateau, lateral to the infrapatellar tendon. Area Prepped: Entire knee area, from the mid-thigh to the mid-shin. Prepping solution: ChloraPrep (2% chlorhexidine gluconate and 70% isopropyl alcohol) Safety Precautions: Aspiration looking for blood return was conducted prior to all injections. At no point did we inject any substances, as a needle was being advanced. No attempts were made at seeking any paresthesias. Safe injection practices and needle disposal techniques used. Medications properly checked for expiration dates. SDV (single dose vial) medications used. Description of the Procedure: Protocol guidelines were followed. The patient was placed in position over the fluoroscopy table. The target area was identified and the area prepped in the usual manner. Skin desensitized using vapocoolant spray. Skin & deeper tissues infiltrated with local anesthetic. Appropriate amount of time allowed to pass for local anesthetics to take effect. The procedure needles were then advanced to the target area. Proper needle placement secured. Negative aspiration confirmed. Solution injected in intermittent fashion, asking for systemic symptoms every 0.5cc of injectate. The needles were then removed and the area cleansed, making sure to leave some of the prepping solution back to take advantage of its long term bactericidal properties. Vitals:   03/14/17 1134 03/14/17 1142 03/14/17 1150 03/14/17 1155  BP: 115/71 (!) 103/42 114/71 112/81  Pulse:      Resp: 18 14 17 17   Temp: 97.3 F (36.3 C)     TempSrc: Temporal     SpO2: 95% 96% 97% 98%  Weight:      Height:        Start Time: 1121 hrs. End Time: 1125 hrs. Materials:  Needle(s) Type: Regular needle Gauge: 22G Length: 3.5-in Medication(s): We administered lactated ringers, midazolam, fentaNYL, Sodium Hyaluronate,  lidocaine (PF), and ropivacaine (PF) 2 mg/mL (0.2%). Please see chart orders for dosing details.  Imaging Guidance:  Type of Imaging Technique: None used Indication(s): N/A Exposure Time: No patient exposure Contrast: None used. Fluoroscopic Guidance: N/A Ultrasound Guidance:  N/A Interpretation: N/A  Antibiotic Prophylaxis:  Indication(s): None identified Antibiotic given: None  Post-operative Assessment:  EBL: None Complications: No immediate post-treatment complications observed by team, or reported by patient. Note: The patient tolerated the entire procedure well. A repeat set of vitals were taken after the procedure and the patient was kept under observation following institutional policy, for this type of procedure. Post-procedural neurological assessment was performed, showing return to baseline, prior to discharge. The patient was provided with post-procedure discharge instructions, including a section on how to identify potential problems. Should any problems arise concerning this procedure, the patient was given instructions to immediately contact us, at any time, without hesitation. In any case, we plan to contact the patient by telephone for a follow-up status report regarding this interventional procedure. Comments:  No additional relevant information.  Plan of Care  Disposition: Discharge home  Discharge Date & Time: 03/14/2017; 1204 hrs.  Physician-requested Follow-up:  Return for procedure (w/ sedation): Right knee Hyalgan No. 2 with sedation, (2 wks), by MD.  Future Appointments Date Time Provider [place]  03/19/2017 8:00 AM Vevelyn Francois, NP [facility] None  03/28/2017 9:15 AM Milinda Pointer, MD [facility] None  04/18/2017 10:00 AM Leone Haven, MD [facility] None   Medications ordered for procedure: Meds ordered this encounter  Medications  . lactated ringers infusion 1,000 mL  . midazolam (VERSED) 5 MG/5ML injection 1-2 mg    Make sure  Flumazenil is available in the pyxis when using this medication. If oversedation occurs, administer 0.2 mg IV over 15 sec. If after 45 sec no response, administer 0.2 mg again over 1 min; may repeat at 1 min intervals; not to exceed 4 doses (1 mg)  . fentaNYL (SUBLIMAZE) injection 25-50 mcg    Make sure Narcan is available in the pyxis when using this medication. In the event of respiratory depression (RR< 8/min): Titrate NARCAN (naloxone) in increments of 0.1 to 0.2 mg IV at 2-3 minute intervals, until desired degree of reversal.  . Sodium Hyaluronate SOSY 2 mL  . lidocaine (PF) (XYLOCAINE) 1 % injection 5 mL  . ropivacaine (PF) 2 mg/mL (0.2%) (NAROPIN) injection 5 mL   Medications administered: We administered lactated ringers, midazolam, fentaNYL, Sodium Hyaluronate, lidocaine (PF), and ropivacaine (PF) 2 mg/mL (0.2%).  See the medical record for exact dosing, route, and time of administration.  Lab-work, Procedure(s), & Referral(s) Ordered: Orders Placed This Encounter  Procedures  . KNEE INJECTION  . KNEE INJECTION  . Informed Consent Details: Transcribe to consent form and obtain patient signature  . Provider attestation of informed consent for procedure/surgical case  . Verify informed consent  . Discharge instructions  . Follow-up   Imaging Ordered: Results for orders placed in visit on 01/31/17  DG C-Arm 1-60 Min-No Report   Narrative Fluoroscopy was utilized by the requesting physician.  No radiographic  interpretation.    New Prescriptions   No medications on file   Primary Care Physician: Leone Haven, MD Location: [facility] Note by: Kathlen Brunswick. Dossie Arbour, M.D, DABA, DABAPM, DABPM, DABIPP, FIPP Date: 03/14/2017; Time: 12:41 PM  Disclaimer:  Medicine is not an Chief Strategy Officer. The only guarantee in medicine is that nothing is guaranteed. It is important to note that the decision to proceed with this intervention was based on the  information collected from the patient. The Data and conclusions were drawn from the patient's questionnaire, the interview, and the physical examination. Because the information was provided in large part  by the patient, it cannot be guaranteed that it has not been purposely or unconsciously manipulated. Every effort has been made to obtain as much relevant data as possible for this evaluation. It is important to note that the conclusions that lead to this procedure are derived in large part from the available data. Always take into account that the treatment will also be dependent on availability of resources and existing treatment guidelines, considered by other Pain Management Practitioners as being common knowledge and practice, at the time of the intervention. For Medico-Legal purposes, it is also important to point out that variation in procedural techniques and pharmacological choices are the acceptable norm. The indications, contraindications, technique, and results of the above procedure should only be interpreted and judged by a Board-Certified Interventional Pain Specialist with extensive familiarity and expertise in the same exact procedure and technique.  Instructions provided at this appointment: Patient Instructions   Post-Procedure instructions Instructions:  Apply ice: Fill a plastic sandwich bag with crushed ice. Cover it with a small towel and apply to injection site. Apply for 15 minutes then remove x 15 minutes. Repeat sequence on day of procedure, until you go to bed. The purpose is to minimize swelling and discomfort after procedure.  Apply heat: Apply heat to procedure site starting the day following the procedure. The purpose is to treat any soreness and discomfort from the procedure.  Food intake: Start with clear liquids (like water) and advance to regular food, as tolerated.   Physical activities: Keep activities to a minimum for the first 8 hours after the procedure.    Driving: If you have received any sedation, you are not allowed to drive for 24 hours after your procedure.  Blood thinner: Restart your blood thinner 6 hours after your procedure. (Only for those taking blood thinners)  Insulin: As soon as you can eat, you may resume your normal dosing schedule. (Only for those taking insulin)  Infection prevention: Keep procedure site clean and dry.  Post-procedure Pain Diary: Extremely important that this be done correctly and accurately. Recorded information will be used to determine the next step in treatment.  Pain evaluated is that of treated area only. Do not include pain from an untreated area.  Complete every hour, on the hour, for the initial 8 hours. Set an alarm to help you do this part accurately.  Do not go to sleep and have it completed later. It will not be accurate.  Follow-up appointment: Keep your follow-up appointment after the procedure. Usually 2 weeks for most procedures. (6 weeks in the case of radiofrequency.) Bring you pain diary.  Expect:  From numbing medicine (AKA: Local Anesthetics): Numbness or decrease in pain.  Onset: Full effect within 15 minutes of injected.  Duration: It will depend on the type of local anesthetic used. On the average, 1 to 8 hours.   From steroids: Decrease in swelling or inflammation. Once inflammation is improved, relief of the pain will follow.  Onset of benefits: Depends on the amount of swelling present. The more swelling, the longer it will take for the benefits to be seen.   Duration: Steroids will stay in the system x 2 weeks. Duration of benefits will depend on multiple posibilities including persistent irritating factors.  From procedure: Some discomfort is to be expected once the numbing medicine wears off. This should be minimal if ice and heat are applied as instructed. Call if:  You experience numbness and weakness that gets worse with time, as opposed to  wearing off.  New  onset bowel or bladder incontinence. (Spinal procedures only)  Emergency Numbers:  Durning business hours (Monday - Thursday, 8:00 AM - 4:00 PM) (Friday, 9:00 AM - 12:00 Noon): [phone]  After hours: [phone] _____________________________________________________________________________________________  _______________________________________________________________  Preparing for Procedure with Sedation Instructions: . Oral Intake: Do not eat or drink anything for at least 8 hours prior to your procedure. . Transportation: Public transportation is not allowed. Bring an adult driver. The driver must be physically present in our waiting room before any procedure can be started. [place] Physical Assistance: Bring an adult physically capable of assisting you, in the event you need help. This adult should keep you company at home for at least 6 hours after the procedure. . Blood Pressure Medicine: Take your blood pressure medicine with a sip of water the morning of the procedure. . Blood thinners:  . Diabetics on insulin: Notify the staff so that you can be scheduled 1st case in the morning. If your diabetes requires high dose insulin, take only  of your normal insulin dose the morning of the procedure and notify the staff that you have done so. . Preventing infections: Shower with an antibacterial soap the morning of your procedure. . Build-up your immune system: Take 1000 mg of Vitamin C with every meal (3 times a day) the day prior to your procedure. Marland Kitchen Antibiotics: Inform the staff if you have a condition or reason that requires you to take antibiotics before dental procedures. . Pregnancy: If you are pregnant, call and cancel the procedure. . Sickness: If you have a cold, fever, or any active infections, call and cancel the procedure. . Arrival: You must be in the facility at least 30 minutes prior to your scheduled procedure. . Children: Do not bring children with you. . Dress  appropriately: Bring dark clothing that you would not mind if they get stained. . Valuables: Do not bring any jewelry or valuables. Procedure appointments are reserved for interventional treatments only. Marland Kitchen No Prescription Refills. . No medication changes will be discussed during procedure appointments. . No disability issues will be discussed. ______________________________________________________________________________________________  ____________________________________________________________________________________________  Medication Rules  Applies to: All patients receiving prescriptions (written or electronic).  Pharmacy of record: Pharmacy where electronic prescriptions will be sent. If written prescriptions are taken to a different pharmacy, please inform the nursing staff. The pharmacy listed in the electronic medical record should be the one where you would like electronic prescriptions to be sent.  Prescription refills: Only during scheduled appointments. Applies to both, written and electronic prescriptions.  NOTE: The following applies primarily to controlled substances (Opioid Pain Medications)  Patient's responsibilities: 1. Pain Pills: Bring all pain pills to every appointment (except for procedure appointments). 2. Pill Bottles: Bring pills in original pharmacy bottle. Always bring newest bottle. Bring bottle, even if empty. 3. Medication refills: You are responsible for knowing and keeping track of what medications you need refilled. The day before your appointment, write a list of all prescriptions that need to be refilled. Bring that list to your appointment and give it to the admitting nurse. Prescriptions will be written only during appointments. If you forget a medication, it will not be "Called in", "Faxed", or "electronically sent". You will need to get another appointment to get these prescribed. 4. Prescription Accuracy: You are responsible for carefully  inspecting your prescriptions before leaving our office. Have the discharge nurse carefully go over each prescription with you, before taking them home. Make sure that  your name is accurately spelled, that your address is correct. Check the name and dose of your medication to make sure it is accurate. Check the number of pills, and the written instructions to make sure they are clear and accurate. Make sure that you are given enough medication to last until your next medication refill appointment. 5. Taking Medication: Take medication as prescribed. Never take more pills than instructed. Never take medication more frequently than prescribed. Taking less pills or less frequently is permitted and encouraged, when it comes to controlled substances (written prescriptions).  6. Inform other Doctors: Always inform, all of your healthcare providers, of all the medications you take. 7. Pain Medication from other Providers: You are not allowed to accept any additional pain medication from any other Doctor or Healthcare provider. There are two exceptions to this rule. (see below) In the event that you require additional pain medication, you are responsible for notifying us, as stated below. 8. Medication Agreement: You are responsible for carefully reading and following our Medication Agreement. This must be signed before receiving any prescriptions from our practice. Safely store a copy of your signed Agreement. Violations to the Agreement will result in no further prescriptions. (Additional copies of our Medication Agreement are available upon request.) 9. Laws, Rules, & Regulations: All patients are expected to follow all Federal and [employer], Rules, [employer]. Ignorance of the Laws does not constitute a valid excuse.  Exceptions: There are only two exceptions to the rule of not receiving pain medications from other Healthcare Providers. 1. Exception #1 (Emergencies): In the event of an emergency  (i.e.: accident requiring emergency care), you are allowed to receive additional pain medication. However, you are responsible for: As soon as you are able, call our office [phone], at any time of the day or night, and leave a message stating your name, the date and nature of the emergency, and the name and dose of the medication prescribed. In the event that your call is answered by a member of our staff, make sure to document and save the date, time, and the name of the person that took your information.  2. Exception #2 (Planned Surgery): In the event that you are scheduled by another doctor or dentist to have any type of surgery or procedure, you are allowed (for a period no longer than 30 days), to receive additional pain medication, for the acute post-op pain. However, in this case, you are responsible for picking up a copy of our "Post-op Pain Management for Surgeons" handout, and giving it to your surgeon or dentist. This document is available at our office, and does not require an appointment to obtain it. Simply go to our office during business hours (Monday-Thursday from 8:00 AM to 4:00 PM) (Friday 8:00 AM to 12:00 Noon) or if you have a scheduled appointment with [place], prior to your surgery, and ask for it by name. In addition, you will need to provide [place] with your name, name of your surgeon, type of surgery, and date of procedure or surgery.  _____________________________________________________________________________________________Pain Score  Introduction: The pain score used by this practice is the Verbal Numerical Rating Scale (VNRS-11). This is an 11-point scale. It is for adults and children 10 years or older. There are significant differences in how the pain score is reported, used, and applied. Forget everything you learned in the past and learn this scoring system.  General Information: The scale should reflect your current level of pain. Unless you are specifically  asked for the  level of your worst pain, or your average pain. If you are asked for one of these two, then it should be understood that it is over the past 24 hours.  Basic Activities of Daily Living (ADL): Personal hygiene, dressing, eating, transferring, and using restroom.  Instructions: Most patients tend to report their level of pain as a combination of two factors, their physical pain and their psychosocial pain. This last one is also known as "suffering" and it is reflection of how physical pain affects you socially and psychologically. From now on, report them separately. From this point on, when asked to report your pain level, report only your physical pain. Use the following table for reference.  Pain Clinic Pain Levels (0-5/10)  Pain Level Score Description  No Pain 0   Mild pain 1 Nagging, annoying, but does not interfere with basic activities of daily living (ADL). Patients are able to eat, bathe, get dressed, toileting (being able to get on and off the toilet and perform personal hygiene functions), transfer (move in and out of bed or a chair without assistance), and maintain continence (able to control bladder and bowel functions). Blood pressure and heart rate are unaffected. A normal heart rate for a healthy adult ranges from 60 to 100 bpm (beats per minute).   Mild to moderate pain 2 Noticeable and distracting. Impossible to hide from other people. More frequent flare-ups. Still possible to adapt and function close to normal. It can be very annoying and may have occasional stronger flare-ups. With discipline, patients may get used to it and adapt.   Moderate pain 3 Interferes significantly with activities of daily living (ADL). It becomes difficult to feed, bathe, get dressed, get on and off the toilet or to perform personal hygiene functions. Difficult to get in and out of bed or a chair without assistance. Very distracting. With effort, it can be ignored when deeply involved in activities.    Moderately severe pain 4 Impossible to ignore for more than a few minutes. With effort, patients may still be able to manage work or participate in some social activities. Very difficult to concentrate. Signs of autonomic nervous system discharge are evident: dilated pupils (mydriasis); mild sweating (diaphoresis); sleep interference. Heart rate becomes elevated (>115 bpm). Diastolic blood pressure (lower number) rises above 100 mmHg. Patients find relief in laying down and not moving.   Severe pain 5 Intense and extremely unpleasant. Associated with frowning face and frequent crying. Pain overwhelms the senses.  Ability to do any activity or maintain social relationships becomes significantly limited. Conversation becomes difficult. Pacing back and forth is common, as getting into a comfortable position is nearly impossible. Pain wakes you up from deep sleep. Physical signs will be obvious: pupillary dilation; increased sweating; goosebumps; brisk reflexes; cold, clammy hands and feet; nausea, vomiting or dry heaves; loss of appetite; significant sleep disturbance with inability to fall asleep or to remain asleep. When persistent, significant weight loss is observed due to the complete loss of appetite and sleep deprivation.  Blood pressure and heart rate becomes significantly elevated. Caution: If elevated blood pressure triggers a pounding headache associated with blurred vision, then the patient should immediately seek attention at an urgent or emergency care unit, as these may be signs of an impending stroke.    Emergency Department Pain Levels (6-10/10)  Emergency Room Pain 6 Severely limiting. Requires emergency care and should not be seen or managed at an outpatient pain management facility. Communication becomes difficult  and requires great effort. Assistance to reach the emergency department may be required. Facial flushing and profuse sweating along with potentially dangerous increases in heart  rate and blood pressure will be evident.   Distressing pain 7 Self-care is very difficult. Assistance is required to transport, or use restroom. Assistance to reach the emergency department will be required. Tasks requiring coordination, such as bathing and getting dressed become very difficult.   Disabling pain 8 Self-care is no longer possible. At this level, pain is disabling. The individual is unable to do even the most "basic" activities such as walking, eating, bathing, dressing, transferring to a bed, or toileting. Fine motor skills are lost. It is difficult to think clearly.   Incapacitating pain 9 Pain becomes incapacitating. Thought processing is no longer possible. Difficult to remember your own name. Control of movement and coordination are lost.   The worst pain imaginable 10 At this level, most patients pass out from pain. When this level is reached, collapse of the autonomic nervous system occurs, leading to a sudden drop in blood pressure and heart rate. This in turn results in a temporary and dramatic drop in blood flow to the brain, leading to a loss of consciousness. Fainting is one of the body's self defense mechanisms. Passing out puts the brain in a calmed state and causes it to shut down for a while, in order to begin the healing process.    Summary: 1. Refer to this scale when providing [place] with your pain level. 2. Be accurate and careful when reporting your pain level. This will help with your care. 3. Over-reporting your pain level will lead to loss of credibility. 4. Even a level of 1/10 means that there is pain and will be treated at our facility. 5. High, inaccurate reporting will be documented as "Symptom Exaggeration", leading to loss of credibility and suspicions of possible secondary gains such as obtaining more narcotics, or wanting to appear disabled, for fraudulent reasons. 6. Only pain levels of 5 or below will be seen at our facility. 7. Pain levels of 6 and  above will be sent to the Emergency Department and the appointment cancelled. _____________________________________________________________________________________________  Pain Management Discharge Instructions  General Discharge Instructions :  If you need to reach your doctor call: Monday-Friday 8:00 am - 4:00 pm at [phone] or toll free [phone].  After clinic hours [phone] to have operator reach doctor.  Bring all of your medication bottles to all your appointments in the pain clinic.  To cancel or reschedule your appointment with Pain Management please remember to call 24 hours in advance to avoid a fee.  Refer to the educational materials which you have been given on: General Risks, I had my Procedure. Discharge Instructions, Post Sedation.  Post Procedure Instructions:  The drugs you were given will stay in your system until tomorrow, so for the next 24 hours you should not drive, make any legal decisions or drink any alcoholic beverages.  You may eat anything you prefer, but it is better to start with liquids then soups and crackers, and gradually work up to solid foods.  Please notify your doctor immediately if you have any unusual bleeding, trouble breathing or pain that is not related to your normal pain.  Depending on the type of procedure that was done, some parts of your body may feel week and/or numb.  This usually clears up by tonight or the next day.  Walk with the use of an assistive device or accompanied  by an adult for the 24 hours.  You may use ice on the affected area for the first 24 hours.  Put ice in a Ziploc bag and cover with a towel and place against area 15 minutes on 15 minutes off.  You may switch to heat after 24 hours.

## 2017-03-15 ENCOUNTER — Telehealth: Payer: Self-pay

## 2017-03-15 NOTE — Telephone Encounter (Signed)
Pt not available. Husband denies any needs

## 2017-03-19 ENCOUNTER — Encounter: Payer: Self-pay | Admitting: Nurse Practitioner

## 2017-03-19 ENCOUNTER — Ambulatory Visit: Payer: BLUE CROSS/BLUE SHIELD | Attending: Nurse Practitioner | Admitting: Nurse Practitioner

## 2017-03-19 VITALS — BP 113/67 | HR 63 | Temp 97.8°F | Resp 16 | Ht 64.0 in | Wt 172.0 lb

## 2017-03-19 DIAGNOSIS — E669 Obesity, unspecified: Secondary | ICD-10-CM | POA: Insufficient documentation

## 2017-03-19 DIAGNOSIS — M546 Pain in thoracic spine: Secondary | ICD-10-CM | POA: Insufficient documentation

## 2017-03-19 DIAGNOSIS — M797 Fibromyalgia: Secondary | ICD-10-CM | POA: Insufficient documentation

## 2017-03-19 DIAGNOSIS — M47814 Spondylosis without myelopathy or radiculopathy, thoracic region: Secondary | ICD-10-CM | POA: Insufficient documentation

## 2017-03-19 DIAGNOSIS — M25561 Pain in right knee: Secondary | ICD-10-CM

## 2017-03-19 DIAGNOSIS — M25512 Pain in left shoulder: Secondary | ICD-10-CM | POA: Insufficient documentation

## 2017-03-19 DIAGNOSIS — F419 Anxiety disorder, unspecified: Secondary | ICD-10-CM | POA: Insufficient documentation

## 2017-03-19 DIAGNOSIS — M47812 Spondylosis without myelopathy or radiculopathy, cervical region: Secondary | ICD-10-CM | POA: Diagnosis not present

## 2017-03-19 DIAGNOSIS — M8580 Other specified disorders of bone density and structure, unspecified site: Secondary | ICD-10-CM | POA: Diagnosis not present

## 2017-03-19 DIAGNOSIS — Z87891 Personal history of nicotine dependence: Secondary | ICD-10-CM | POA: Insufficient documentation

## 2017-03-19 DIAGNOSIS — R41840 Attention and concentration deficit: Secondary | ICD-10-CM | POA: Insufficient documentation

## 2017-03-19 DIAGNOSIS — M7062 Trochanteric bursitis, left hip: Secondary | ICD-10-CM | POA: Diagnosis not present

## 2017-03-19 DIAGNOSIS — M7061 Trochanteric bursitis, right hip: Secondary | ICD-10-CM | POA: Diagnosis not present

## 2017-03-19 DIAGNOSIS — F329 Major depressive disorder, single episode, unspecified: Secondary | ICD-10-CM | POA: Insufficient documentation

## 2017-03-19 DIAGNOSIS — M25551 Pain in right hip: Secondary | ICD-10-CM | POA: Insufficient documentation

## 2017-03-19 DIAGNOSIS — R109 Unspecified abdominal pain: Secondary | ICD-10-CM | POA: Insufficient documentation

## 2017-03-19 DIAGNOSIS — K5730 Diverticulosis of large intestine without perforation or abscess without bleeding: Secondary | ICD-10-CM | POA: Insufficient documentation

## 2017-03-19 DIAGNOSIS — Z79891 Long term (current) use of opiate analgesic: Secondary | ICD-10-CM

## 2017-03-19 DIAGNOSIS — E782 Mixed hyperlipidemia: Secondary | ICD-10-CM | POA: Diagnosis not present

## 2017-03-19 DIAGNOSIS — G8929 Other chronic pain: Secondary | ICD-10-CM | POA: Diagnosis not present

## 2017-03-19 DIAGNOSIS — G894 Chronic pain syndrome: Secondary | ICD-10-CM

## 2017-03-19 DIAGNOSIS — M25511 Pain in right shoulder: Secondary | ICD-10-CM | POA: Diagnosis not present

## 2017-03-19 DIAGNOSIS — M542 Cervicalgia: Secondary | ICD-10-CM | POA: Insufficient documentation

## 2017-03-19 DIAGNOSIS — M160 Bilateral primary osteoarthritis of hip: Secondary | ICD-10-CM | POA: Insufficient documentation

## 2017-03-19 DIAGNOSIS — R7982 Elevated C-reactive protein (CRP): Secondary | ICD-10-CM | POA: Insufficient documentation

## 2017-03-19 MED ORDER — OXYCODONE HCL 5 MG PO TABS: 5.0000 mg | ORAL_TABLET | Freq: Four times a day (QID) | ORAL | 0 refills | Status: DC | PRN

## 2017-03-19 MED ORDER — METAXALONE 800 MG PO TABS
800.0000 mg | ORAL_TABLET | Freq: Three times a day (TID) | ORAL | 1 refills | Status: AC
Start: 2017-03-19 — End: 2017-05-18

## 2017-03-19 NOTE — Patient Instructions (Signed)

## 2017-03-19 NOTE — Progress Notes (Signed)
Patient's Name: Nicole Walter  MRN: [id number]  Referring Provider: Leone Haven, MD  DOB: 04-01-1961  PCP: Leone Haven, MD  DOS: 03/19/2017  Note by: Vevelyn Francois NP  Service setting: Ambulatory outpatient  Specialty: Interventional Pain Management  Location: [facility] (AMB) Pain Management Facility    Patient type: Established    Primary Reason(s) for Visit: Encounter for prescription drug management (Level of risk: moderate) CC: Hip Pain (right) and Neck Pain (radiates down right side shoulder )  HPI  Nicole Walter is a 56 y.o. year old, female patient, who comes today for a medication management evaluation. She has Anxiety and depression; Chronic pain syndrome; Lumbar spondylosis; Combined fat and carbohydrate induced hyperlipemia; Thoracic back pain; Chronic neck pain (Location of Primary Source of Pain) (Bilateral) (L>R); Concentration deficit; Hyperlipidemia; Cervical osteoarthritis; Fibromyalgia; Osteopenia; Skin lesions; Chronic hip pain (Bilateral) (L>R); Bursitis of right shoulder; Chronic Greater trochanteric bursitis (Bilateral) (L>R); Long term current use of opiate analgesic; Long term prescription opiate use; Opiate use; Chronic upper back pain (Location of Secondary source of pain) (Bilateral) (L>R); Chronic shoulder pain (Location of Tertiary source of pain) (Bilateral) (L>R); Chronic low back pain (Bilateral) (L>R); Chronic knee pain (Right); Chronic upper extremity pain (Left); Chronic lower extremity pain (Bilateral) (L>R); Chronic abdominal pain; Elevated sedimentation rate; Elevated C-reactive protein (CRP); Obesity; Greater trochanteric bursitis (Right); Subacromial bursitis of shoulder joint (Right); Osteoarthritis of hip (Bilateral) (L>R); Thoracic radiculitis (R>L); Thoracic spondylosis; Shortness of breath; Osteoarthritis of shoulder (Right); Osteoarthritis of knee (Right); Chronic knee arthropathy (Right); and Chronic right hip pain on her problem list. Her primarily  concern today is the Hip Pain (right) and Neck Pain (radiates down right side shoulder )  Pain Assessment: Self-Reported Pain Score: 2 /10             Reported level is compatible with observation.       Pain Type: Chronic pain Pain Location: Hip Pain Orientation: Right Pain Descriptors / Indicators: Sharp, Aching, Constant, Discomfort (Feels like someone to tearing my hip apart") Pain Frequency: Constant  Nicole Walter was last scheduled for an appointment on 02/20/17 for medication management. During today's appointment we reviewed Nicole Walter's chronic pain status, as well as her outpatient medication regimen. She is in today for her chronic right hip pain. She has radicular symptoms that goes into her groin area. She denies any numbness, tingling or weakness in her leg.  Her MRI of her hip, she has mild degenerative disease. She is SP Hyalgan injection #1. She is not sure how effective this is has been. She has not completed her evaluation form completely but will follow up in a few weeks. She has constipation she uses the Miralax and Duculox.  She does not feel like the tizanidine is not effective. She has failed flexeril and Robaxin. She states that the Gabapentin at 3600 mg is not effective. She has failed Lyrica. She admits that when it was increased that she felt like her heart was squeezing.   The patient  reports that she does not use drugs. Her body mass index is 29.52 kg/m.  Further details on both, my assessment(s), as well as the proposed treatment plan, please see below.  Controlled Substance Pharmacotherapy Assessment REMS (Risk Evaluation and Mitigation Strategy)  Analgesic:Oxycodone one tablet by mouth 4 times a day (20 mg/dayof hydrocodone) MME/day:24m/day GIgnatius Specking RN  03/19/2017  8:26 AM  Sign at close encounter Nursing Pain Medication Assessment:  Safety precautions to be maintained throughout the  outpatient stay will include: orient to surroundings, keep bed in  low position, maintain call bell within reach at all times, provide assistance with transfer out of bed and ambulation.  Medication Inspection Compliance: Pill count conducted under aseptic conditions, in front of the patient. Neither the pills nor the bottle was removed from the patient's sight at any time. Once count was completed pills were immediately returned to the patient in their original bottle.  Medication: See above Pill/Patch Count: 96 of 120 pills remain Pill/Patch Appearance: Markings consistent with prescribed medication Bottle Appearance: Standard pharmacy container. Clearly labeled. Filled Date: 05 / 08 / 2018 Last Medication intake:  Today   Pharmacokinetics: Liberation and absorption (onset of action): WNL Distribution (time to peak effect): WNL Metabolism and excretion (duration of action): WNL         Pharmacodynamics: Desired effects: Analgesia: Nicole Walter reports >50% benefit. Functional ability: Patient reports that medication allows her to accomplish basic ADLs Clinically meaningful improvement in function (CMIF): Sustained CMIF goals met Perceived effectiveness: Described as relatively effective, allowing for increase in activities of daily living (ADL) Undesirable effects: Side-effects or Adverse reactions: None reported Monitoring: [place] PMP: Online review of the past 71-monthperiod conducted. Compliant with practice rules and regulations List of all UDS test(s) done:  Lab Results  Component Value Date   SUMMARY FINAL 07/20/2016   Last UDS on record: No results found for: TOXASSSELUR UDS interpretation: Compliant          Medication Assessment Form: Reviewed. Patient indicates being compliant with therapy Treatment compliance: Compliant Risk Assessment Profile: Aberrant behavior: See prior evaluations. None observed or detected today Comorbid factors increasing risk of overdose: See prior notes. No additional risks detected today Risk of substance use  disorder (SUD): Low Opioid Risk Tool (ORT) Total Score: 7  Interpretation Table:  Score <3 = Low Risk for SUD  Score between 4-7 = Moderate Risk for SUD  Score >8 = High Risk for Opioid Abuse   Risk Mitigation Strategies:  Patient Counseling: Covered Patient-Prescriber Agreement (PPA): Present and active  Notification to other healthcare providers: Done  Pharmacologic Plan: No change in therapy, at this time  Laboratory Chemistry  Inflammation Markers Lab Results  Component Value Date   CRP 2.1 (H) 07/20/2016   ESRSEDRATE 37 (H) 07/20/2016   (CRP: Acute Phase) (ESR: Chronic Phase) Renal Function Markers Lab Results  Component Value Date   BUN 13 01/15/2017   CREATININE 0.89 01/15/2017   GFRAA >60 07/20/2016   GFRNONAA >60 07/20/2016   Hepatic Function Markers Lab Results  Component Value Date   AST 31 01/19/2017   ALT 45 (H) 01/19/2017   ALBUMIN 4.3 01/19/2017   ALKPHOS 74 01/19/2017   HCVAB NEGATIVE 02/14/2017   Electrolytes Lab Results  Component Value Date   NA 141 01/15/2017   K 4.3 01/15/2017   CL 103 01/15/2017   CALCIUM 9.3 01/15/2017   MG 2.1 07/20/2016   Neuropathy Markers Lab Results  Component Value Date   VFTDDUKGU54 270(H) 07/20/2016   Bone Pathology Markers Lab Results  Component Value Date   ALKPHOS 74 01/19/2017   25OHVITD1 58 07/20/2016   25OHVITD2 <1.0 07/20/2016   25OHVITD3 58 07/20/2016   CALCIUM 9.3 01/15/2017   Coagulation Parameters Lab Results  Component Value Date   PLT 300 01/15/2017   Cardiovascular Markers Lab Results  Component Value Date   HGB 12.4 01/15/2017   HCT 37.8 01/15/2017   Note: Lab results reviewed.  Recent Diagnostic Imaging  Review  Mr Hip Right Wo Contrast  Result Date: 02/07/2017 CLINICAL DATA:  Right hip, buttock and groin pain for 2 years. No known injury. EXAM: MR OF THE RIGHT HIP WITHOUT CONTRAST TECHNIQUE: Multiplanar, multisequence MR imaging was performed. No intravenous contrast was  administered. COMPARISON:  CT abdomen and pelvis and 12/20/2015. FINDINGS: Bones: Small subchondral cyst in the right acetabular roof is noted. Bone marrow signal is otherwise normal. No fracture or stress change. No avascular necrosis of the femoral heads. Articular cartilage and labrum Articular cartilage:  Minimally degenerated. Labrum:  Intact. Joint or bursal effusion Joint effusion:  No effusion. Bursae:  Negative. Muscles and tendons Muscles and tendons:  Intact. Other findings Miscellaneous: Sigmoid diverticulosis without diverticulitis is seen. IMPRESSION: Mild right hip degenerative disease.  No acute finding. Sigmoid diverticulosis. Electronically Signed   By: Inge Rise M.D.   On: 02/07/2017 15:50   Note: Imaging results reviewed.          Meds  The patient has a current medication list which includes the following prescription(s): vitamin d, fluocinonide ointment, fluticasone, gabapentin, hydroxyzine, multi-vitamins, oxycodone, rabeprazole, simvastatin, sucralfate, trazodone, trintellix, metaxalone, and oxycodone.  Current Outpatient Prescriptions on File Prior to Visit  Medication Sig  . Cholecalciferol (VITAMIN D) 2000 units tablet Take 4,000 Units by mouth daily.  . fluocinonide ointment (LIDEX) 0.05 % APPLY TOPICALLY TWICE A DAY TO ITCHY AREAS ONLY. as needed  . fluticasone (FLONASE) 50 MCG/ACT nasal spray instill 2 sprays into each nostril once daily  . gabapentin (NEURONTIN) 300 MG capsule Take 1-3 capsules (300-900 mg total) by mouth every 8 (eight) hours. Follow titration schedule (Patient taking differently: Take 300-900 mg by mouth every 8 (eight) hours. Follow titration schedule)  . hydrOXYzine (VISTARIL) 25 MG capsule Take 25 mg by mouth 2 (two) times daily as needed.  . Multiple Vitamin (MULTI-VITAMINS) TABS Take 1 tablet by mouth daily.   . RABEprazole (ACIPHEX) 20 MG tablet Take 20 mg by mouth as needed.   . simvastatin (ZOCOR) 20 MG tablet take 1 tablet by mouth at  bedtime  . sucralfate (CARAFATE) 1 g tablet take 1 tablet by mouth three times a day if needed for abdominal pain  . traZODone (DESYREL) 100 MG tablet take 1 tablet by mouth at bedtime if needed  . TRINTELLIX 10 MG TABS 1 tablet daily.    No current facility-administered medications on file prior to visit.    ROS  Constitutional: Denies any fever or chills Gastrointestinal: No reported hemesis, hematochezia, vomiting, or acute GI distress Musculoskeletal: Denies any acute onset joint swelling, redness, loss of ROM, or weakness Neurological: No reported episodes of acute onset apraxia, aphasia, dysarthria, agnosia, amnesia, paralysis, loss of coordination, or loss of consciousness  Allergies  Nicole Walter is allergic to other and food.  PFSH  Drug: Nicole Walter  reports that she does not use drugs. Alcohol:  reports that she does not drink alcohol. Tobacco:  reports that she quit smoking about 20 months ago. She started smoking about 43 years ago. She has never used smokeless tobacco. Medical:  has a past medical history of Abnormal liver enzymes (02/08/2016); Allergy; Anxiety; Chest pain (07/05/2016); Chronic pain; Depression; DJD (degenerative joint disease); Fibromyalgia; High cholesterol; and Rash of back (01/28/2016). Family: family history includes Arthritis in her father, paternal grandfather, and paternal grandmother; Cancer in her brother and father; Dementia in her sister; Diabetes in her sister; Drug abuse in her brother, brother, and sister; Heart disease in her mother; Post-traumatic stress  disorder in her brother; Stroke in her mother.  Past Surgical History:  Procedure Laterality Date  . ANKLE SURGERY     x 2  . BREAST BIOPSY Left 1997  . CHOLECYSTECTOMY    . COLONOSCOPY    . COLONOSCOPY WITH PROPOFOL N/A 02/22/2016   Procedure: COLONOSCOPY WITH PROPOFOL;  Surgeon: Lollie Sails, MD;  Location[facility] ENDOSCOPY;  Service: Endoscopy;  Laterality: N/A;  . ESOPHAGOGASTRODUODENOSCOPY  (EGD) WITH PROPOFOL N/A 02/22/2016   Procedure: ESOPHAGOGASTRODUODENOSCOPY (EGD) WITH PROPOFOL;  Surgeon: Lollie Sails, MD;  Location[facility] ENDOSCOPY;  Service: Endoscopy;  Laterality: N/A;  . NASAL SINUS SURGERY    . TONSILLECTOMY    . TUBAL LIGATION     Constitutional Exam  General appearance: Well nourished, well developed, and well hydrated. In no apparent acute distress Vitals:   03/19/17 0812  BP: 113/67  Pulse: 63  Resp: 16  Temp: 97.8 F (36.6 C)  SpO2: 100%  Weight: 172 lb (78 kg)  Height: _0  (1.626 m)   BMI Assessment: Estimated body mass index is 29.52 kg/m as calculated from the following:   Height as of this encounter: _1  (1.626 m).   Weight as of this encounter: 172 lb (78 kg).  BMI interpretation table: BMI level Category Range association with higher incidence of chronic pain  <18 kg/m2 Underweight   18.5-24.9 kg/m2 Ideal body weight   25-29.9 kg/m2 Overweight Increased incidence by 20%  30-34.9 kg/m2 Obese (Class I) Increased incidence by 68%  35-39.9 kg/m2 Severe obesity (Class II) Increased incidence by 136%  >40 kg/m2 Extreme obesity (Class III) Increased incidence by 254%   BMI Readings from Last 4 Encounters:  03/19/17 29.52 kg/m  03/14/17 29.18 kg/m  02/21/17 29.52 kg/m  02/20/17 29.52 kg/m   Wt Readings from Last 4 Encounters:  03/19/17 172 lb (78 kg)  03/14/17 170 lb (77.1 kg)  02/21/17 172 lb (78 kg)  02/20/17 172 lb (78 kg)  Psych/Mental status: Alert, oriented x 3 (person, place, & time)       Eyes: PERLA Respiratory: No evidence of acute respiratory distress  Lumbar Spine Exam  Inspection: No masses, redness, or swelling Alignment: Symmetrical Functional ROM: Unrestricted ROM      Stability: No instability detected Muscle strength & Tone: Functionally intact Sensory: Unimpaired Palpation: No palpable anomalies       Provocative Tests: Lumbar Hyperextension and rotation test: evaluation deferred today       Patrick's  Maneuver: Negative                    Gait & Posture Assessment  Ambulation: Unassisted Gait: Relatively normal for age and body habitus Posture: WNL   Lower Extremity Exam    Side: Right lower extremity, hip  Side: Left lower extremity  Inspection: No masses, redness, swelling, or asymmetry. No contractures  Inspection: No masses, redness, swelling, or asymmetry. No contractures  Functional ROM: Unrestricted ROM          Functional ROM: Unrestricted ROM          Muscle strength & Tone: Functionally intact  Muscle strength & Tone: Functionally intact  Sensory: Unimpaired  Sensory: Unimpaired  Palpation: Complains of area being tender to palpation  Palpation: Complains of area being tender to palpation   Assessment  Primary Diagnosis & Pertinent Problem List: The primary encounter diagnosis was Chronic right hip pain. Diagnoses of Chronic knee pain (Right), Osteoarthritis of hip (Bilateral) (L>R), Chronic pain syndrome, and Long term  current use of opiate analgesic were also pertinent to this visit.  Status Diagnosis  Controlled Controlled Controlled 1. Chronic right hip pain   2. Chronic knee pain (Right)   3. Osteoarthritis of hip (Bilateral) (L>R)   4. Chronic pain syndrome   5. Long term current use of opiate analgesic      Plan of Care  Pharmacotherapy (Medications Ordered): Meds ordered this encounter  Medications  . metaxalone (SKELAXIN) 800 MG tablet    Sig: Take 1 tablet (800 mg total) by mouth 3 (three) times daily.    Dispense:  90 tablet    Refill:  1    Order Specific Question:   Supervising Provider    Answer:   Milinda Pointer [phone]  . oxyCODONE (OXY IR/ROXICODONE) 5 MG immediate release tablet    Sig: Take 1 tablet (5 mg total) by mouth every 6 (six) hours as needed for severe pain.    Dispense:  120 tablet    Refill:  0    Do not place this medication, or any other prescription from our practice, on "Automatic Refill". Patient may have prescription  filled one day early if pharmacy is closed on scheduled refill date. Do not fill until: 04/13/17 To last until: 05/13/17    Order Specific Question:   Supervising Provider    Answer:   Milinda Pointer [phone]  . oxyCODONE (OXY IR/ROXICODONE) 5 MG immediate release tablet    Sig: Take 1 tablet (5 mg total) by mouth every 6 (six) hours as needed for severe pain.    Dispense:  120 tablet    Refill:  0    Do not place this medication, or any other prescription from our practice, on "Automatic Refill". Patient may have prescription filled one day early if pharmacy is closed on scheduled refill date. Do not fill until: 05/13/17 To last until: 06/12/17    Order Specific Question:   Supervising Provider    Answer:   Milinda Pointer [phone]   New Prescriptions   METAXALONE (SKELAXIN) 800 MG TABLET    Take 1 tablet (800 mg total) by mouth 3 (three) times daily.   Medications administered today: Nicole Walter had no medications administered during this visit. Lab-work, procedure(s), and/or referral(s): Orders Placed This Encounter  Procedures  . ToxASSURE Select 13 (MW), Urine   Imaging and/or referral(s): None  Interventional therapies: Planned, scheduled, and/or pending:   Therapeutic Intra-Articular Hyalgan Knee Injection #2 She will taper down on the Gabapentin. She will try the [facility]   Considering:   Diagnostic bilateral cervical facet block Possible bilateral cervical facet radiofrequency ablation Diagnostic left-sided cervical epidural steroid injection Diagnostic bilateral intra-articular shoulder joint injection Diagnostic bilateral suprascapular nerve block Possible bilateral suprascapular nerve radiofrequency ablation Diagnostic bilateral lumbar facet block Possible bilateral lumbar facet radiofrequency ablation  Diagnostic bilateral intra-articular hip joint injection  Diagnostic bilateral femoral and obturator tickler branch blocks  Possible bilateral hip joint  radiofrequency ablation  Diagnostic right intra-articular knee joint injection Possible series of 5, right-sided, intra-articular knee joint injections with Hyalgan.  Possible right sided genicular nerve block Possible right sided genicular nerve radiofrequency ablation Possible left-sided lumbar epidural steroid injection    Palliative PRN treatment(s):   Palliative bilateral suprascapular nerve block Palliative right intra-articular knee joint injection   Provider-requested follow-up: No Follow-up on file.  Future Appointments Date Time Provider Hunter  03/28/2017 9:15 AM Milinda Pointer, MD [facility] None  04/18/2017 10:00 AM Leone Haven, MD [facility] None   Primary Care  Physician: Leone Haven, MD Location: [facility] Management Facility Note by: Vevelyn Francois NP Date: 03/19/2017; Time: 9:10 AM  Pain Score Disclaimer: We use the NRS-11 scale. This is a self-reported, subjective measurement of pain severity with only modest accuracy. It is used primarily to identify changes within a particular patient. It must be understood that outpatient pain scales are significantly less accurate that those used for research, where they can be applied under ideal controlled circumstances with minimal exposure to variables. In reality, the score is likely to be a combination of pain intensity and pain affect, where pain affect describes the degree of emotional arousal or changes in action readiness caused by the sensory experience of pain. Factors such as social and work situation, setting, emotional state, anxiety levels, expectation, and prior pain experience may influence pain perception and show large inter-individual differences that may also be affected by time variables.  Patient instructions provided during this appointment: There are no Patient Instructions on file for this visit.

## 2017-03-19 NOTE — Progress Notes (Signed)
Nursing Pain Medication Assessment:  Safety precautions to be maintained throughout the outpatient stay will include: orient to surroundings, keep bed in low position, maintain call bell within reach at all times, provide assistance with transfer out of bed and ambulation.  Medication Inspection Compliance: Pill count conducted under aseptic conditions, in front of the patient. Neither the pills nor the bottle was removed from the patient's sight at any time. Once count was completed pills were immediately returned to the patient in their original bottle.  Medication: See above Pill/Patch Count: 96 of 120 pills remain Pill/Patch Appearance: Markings consistent with prescribed medication Bottle Appearance: Standard pharmacy container. Clearly labeled. Filled Date: 05 / 08 / 2018 Last Medication intake:  Today

## 2017-03-21 ENCOUNTER — Ambulatory Visit: Payer: BLUE CROSS/BLUE SHIELD | Admitting: Pain Medicine

## 2017-03-23 ENCOUNTER — Telehealth: Payer: Self-pay | Admitting: Family Medicine

## 2017-03-23 NOTE — Telephone Encounter (Signed)
P called and wanted to know if she could start a diet pill that was previously discussed. Pt states that she had the testing done for her heart and cannot get down past 170 lbs. Please advise, thank you!  Call Pt @ [phone]

## 2017-03-23 NOTE — Telephone Encounter (Signed)
Please advise 

## 2017-03-24 LAB — TOXASSURE SELECT 13 (MW), URINE

## 2017-03-26 NOTE — Telephone Encounter (Signed)
I would suggest follow-up in the office for her weight to discuss this.

## 2017-03-27 NOTE — Telephone Encounter (Signed)
Patient notified and is scheduled for 04/18/17

## 2017-03-28 ENCOUNTER — Ambulatory Visit: Payer: BLUE CROSS/BLUE SHIELD | Attending: Pain Medicine | Admitting: Pain Medicine

## 2017-03-28 ENCOUNTER — Encounter: Payer: Self-pay | Admitting: Pain Medicine

## 2017-03-28 VITALS — BP 128/73 | HR 61 | Temp 97.5°F | Resp 15 | Ht 64.0 in | Wt 172.0 lb

## 2017-03-28 DIAGNOSIS — M542 Cervicalgia: Secondary | ICD-10-CM | POA: Insufficient documentation

## 2017-03-28 DIAGNOSIS — M1711 Unilateral primary osteoarthritis, right knee: Secondary | ICD-10-CM

## 2017-03-28 DIAGNOSIS — M549 Dorsalgia, unspecified: Secondary | ICD-10-CM | POA: Diagnosis not present

## 2017-03-28 DIAGNOSIS — M25551 Pain in right hip: Secondary | ICD-10-CM | POA: Diagnosis present

## 2017-03-28 DIAGNOSIS — M25561 Pain in right knee: Secondary | ICD-10-CM

## 2017-03-28 DIAGNOSIS — G8929 Other chronic pain: Secondary | ICD-10-CM

## 2017-03-28 DIAGNOSIS — M25511 Pain in right shoulder: Secondary | ICD-10-CM | POA: Insufficient documentation

## 2017-03-28 DIAGNOSIS — M25572 Pain in left ankle and joints of left foot: Secondary | ICD-10-CM | POA: Diagnosis not present

## 2017-03-28 MED ORDER — LACTATED RINGERS IV SOLN
1000.0000 mL | Freq: Once | INTRAVENOUS | Status: AC
  Administered 2017-03-28: 1000 mL via INTRAVENOUS

## 2017-03-28 MED ORDER — MIDAZOLAM HCL 5 MG/5ML IJ SOLN
1.0000 mg | INTRAMUSCULAR | Status: DC | PRN
  Administered 2017-03-28: 3 mg via INTRAVENOUS

## 2017-03-28 MED ORDER — FENTANYL CITRATE (PF) 100 MCG/2ML IJ SOLN
INTRAMUSCULAR | Status: AC
  Filled 2017-03-28: qty 2

## 2017-03-28 MED ORDER — ROPIVACAINE HCL 2 MG/ML IJ SOLN
5.0000 mL | Freq: Once | INTRAMUSCULAR | Status: AC
  Administered 2017-03-28: 5 mL via INTRA_ARTICULAR
  Filled 2017-03-28: qty 10

## 2017-03-28 MED ORDER — SODIUM CHLORIDE 0.9 % IJ SOLN
INTRAMUSCULAR | Status: AC
  Filled 2017-03-28: qty 10

## 2017-03-28 MED ORDER — FENTANYL CITRATE (PF) 100 MCG/2ML IJ SOLN
25.0000 ug | INTRAMUSCULAR | Status: DC | PRN
  Administered 2017-03-28: 100 ug via INTRAVENOUS

## 2017-03-28 MED ORDER — MIDAZOLAM HCL 5 MG/5ML IJ SOLN
INTRAMUSCULAR | Status: AC
  Filled 2017-03-28: qty 5

## 2017-03-28 MED ORDER — LIDOCAINE HCL (PF) 1 % IJ SOLN: 5.0000 mL | Freq: Once | INTRAMUSCULAR | Status: DC

## 2017-03-28 MED ORDER — SODIUM HYALURONATE (VISCOSUP) 20 MG/2ML IX SOSY
2.0000 mL | PREFILLED_SYRINGE | Freq: Once | INTRA_ARTICULAR | Status: AC
  Administered 2017-03-28: 2 mL via INTRA_ARTICULAR
  Filled 2017-03-28: qty 2

## 2017-03-28 NOTE — Progress Notes (Signed)
Safety precautions to be maintained throughout the outpatient stay will include: orient to surroundings, keep bed in low position, maintain call bell within reach at all times, provide assistance with transfer out of bed and ambulation.  

## 2017-03-28 NOTE — Patient Instructions (Addendum)
____________________________________________________________________________________________  Preparing for your procedure (without sedation) Instructions: . Oral Intake: Do not eat or drink anything for at least 3 hours prior to your procedure. . Transportation: Unless otherwise stated by your physician, you may drive yourself after the procedure. . Blood Pressure Medicine: Take your blood pressure medicine with a sip of water the morning of the procedure. . Blood thinners:  . Diabetics on insulin: Notify the staff so that you can be scheduled 1st case in the morning. If your diabetes requires high dose insulin, take only  of your normal insulin dose the morning of the procedure and notify the staff that you have done so. . Preventing infections: Shower with an antibacterial soap the morning of your procedure.  . Build-up your immune system: Take 1000 mg of Vitamin C with every meal (3 times a day) the day prior to your procedure. . Antibiotics: Inform the staff if you have a condition or reason that requires you to take antibiotics before dental procedures. . Pregnancy: If you are pregnant, call and cancel the procedure. . Sickness: If you have a cold, fever, or any active infections, call and cancel the procedure. . Arrival: You must be in the facility at least 30 minutes prior to your scheduled procedure. . Children: Do not bring any children with you. . Dress appropriately: Bring dark clothing that you would not mind if they get stained. . Valuables: Do not bring any jewelry or valuables. Procedure appointments are reserved for interventional treatments only. . No Prescription Refills. . No medication changes will be discussed during procedure appointments. . No disability issues will be discussed. ____________________________________________________________________________________________  Pain Management Discharge Instructions  General Discharge Instructions :  If you need to  reach your doctor call: Monday-Friday 8:00 am - 4:00 pm at [phone] or toll free [phone].  After clinic hours [phone] to have operator reach doctor.  Bring all of your medication bottles to all your appointments in the pain clinic.  To cancel or reschedule your appointment with Pain Management please remember to call 24 hours in advance to avoid a fee.  Refer to the educational materials which you have been given on: General Risks, I had my Procedure. Discharge Instructions, Post Sedation.  Post Procedure Instructions:  The drugs you were given will stay in your system until tomorrow, so for the next 24 hours you should not drive, make any legal decisions or drink any alcoholic beverages.  You may eat anything you prefer, but it is better to start with liquids then soups and crackers, and gradually work up to solid foods.  Please notify your doctor immediately if you have any unusual bleeding, trouble breathing or pain that is not related to your normal pain.  Depending on the type of procedure that was done, some parts of your body may feel week and/or numb.  This usually clears up by tonight or the next day.  Walk with the use of an assistive device or accompanied by an adult for the 24 hours.  You may use ice on the affected area for the first 24 hours.  Put ice in a Ziploc bag and cover with a towel and place against area 15 minutes on 15 minutes off.  You may switch to heat after 24 hours. 

## 2017-03-28 NOTE — Progress Notes (Signed)
Patient's Name: Nicole Walter  MRN: [id number]  Referring Provider: Leone Haven, MD  DOB: 12-22-1960  PCP: Leone Haven, MD  DOS: 03/28/2017  Note by: Kathlen Brunswick. Dossie Arbour, MD  Service setting: Ambulatory outpatient  Location: [facility] (AMB) Pain Management Facility  Visit type: Procedure  Specialty: Interventional Pain Management  Patient type: Established   Primary Reason for Visit: Interventional Pain Management Treatment. CC: Knee Pain (right); Back Pain (low right); Shoulder Pain (right); Neck Pain (right); Hip Pain (right); and Ankle Pain (left)  Procedure:  Anesthesia, Analgesia, Anxiolysis:  Type: Therapeutic Intra-Articular Hyalgan Knee Injection #2 Region: Lateral infrapatellar Knee Region Level: Knee Joint Laterality: Right knee  Type: Local Anesthesia with Moderate (Conscious) Sedation Local Anesthetic: Lidocaine 1% Route: Intravenous (IV) IV Access: Secured Sedation: Meaningful verbal contact was maintained at all times during the procedure  Indication(s): Analgesia and Anxiety  Indications: 1. Chronic knee arthropathy (Right)   2. Chronic knee pain (Right)   3. Osteoarthritis of knee (Right)    Pain Score: Pre-procedure: 1 /10 Post-procedure: 0-No pain/10  Pre-op Assessment:  Previous date of service: 03/14/17 Service provided: Procedure (right knee hyalgan) Nicole Walter is a 56 y.o. (year old), female patient, seen today for interventional treatment. She  has a past surgical history that includes Cholecystectomy; Tubal ligation; Ankle surgery; Nasal sinus surgery; Tonsillectomy; Colonoscopy; Esophagogastroduodenoscopy (egd) with propofol (N/A, 02/22/2016); Colonoscopy with propofol (N/A, 02/22/2016); and Breast biopsy (Left, 1997). Her primarily concern today is the Knee Pain (right); Back Pain (low right); Shoulder Pain (right); Neck Pain (right); Hip Pain (right); and Ankle Pain (left)  Initial Vital Signs: Blood pressure 120/66, pulse 61, temperature 98.1 F (36.7  C), resp. rate 16, height 5\' 4"  (1.626 m), weight 172 lb (78 kg), SpO2 96 %. BMI: 29.52 kg/m  Risk Assessment: Allergies: Reviewed. She is allergic to other and food.  Allergy Precautions: None required Coagulopathies: Reviewed. None identified.  Blood-thinner therapy: None at this time Active Infection(s): Reviewed. None identified. Nicole Walter is afebrile  Site Confirmation: Nicole Walter was asked to confirm the procedure and laterality before marking the site Procedure checklist: Completed Consent: Before the procedure and under the influence of no sedative(s), amnesic(s), or anxiolytics, the patient was informed of the treatment options, risks and possible complications. To fulfill our ethical and legal obligations, as recommended by the American Medical Association's Code of Ethics, I have informed the patient of my clinical impression; the nature and purpose of the treatment or procedure; the risks, benefits, and possible complications of the intervention; the alternatives, including doing nothing; the risk(s) and benefit(s) of the alternative treatment(s) or procedure(s); and the risk(s) and benefit(s) of doing nothing. The patient was provided information about the general risks and possible complications associated with the procedure. These may include, but are not limited to: failure to achieve desired goals, infection, bleeding, organ or nerve damage, allergic reactions, paralysis, and death. In addition, the patient was informed of those risks and complications associated to the procedure, such as failure to decrease pain; infection; bleeding; organ or nerve damage with subsequent damage to sensory, motor, and/or autonomic systems, resulting in permanent pain, numbness, and/or weakness of one or several areas of the body; allergic reactions; (i.e.: anaphylactic reaction); and/or death. Furthermore, the patient was informed of those risks and complications associated with the medications.  These include, but are not limited to: allergic reactions (i.e.: anaphylactic or anaphylactoid reaction(s)); adrenal axis suppression; blood sugar elevation that in diabetics may result in ketoacidosis or comma; water retention  that in patients with history of congestive heart failure may result in shortness of breath, pulmonary edema, and decompensation with resultant heart failure; weight gain; swelling or edema; medication-induced neural toxicity; particulate matter embolism and blood vessel occlusion with resultant organ, and/or nervous system infarction; and/or aseptic necrosis of one or more joints. Finally, the patient was informed that Medicine is not an exact science; therefore, there is also the possibility of unforeseen or unpredictable risks and/or possible complications that may result in a catastrophic outcome. The patient indicated having understood very clearly. We have given the patient no guarantees and we have made no promises. Enough time was given to the patient to ask questions, all of which were answered to the patient's satisfaction. Nicole Walter has indicated that she wanted to continue with the procedure. Attestation: I, the ordering provider, attest that I have discussed with the patient the benefits, risks, side-effects, alternatives, likelihood of achieving goals, and potential problems during recovery for the procedure that I have provided informed consent. Date: 03/28/2017; Time: 10:14 AM  Pre-Procedure Preparation:  Monitoring: As per clinic protocol. Respiration, ETCO2, SpO2, BP, heart rate and rhythm monitor placed and checked for adequate function Safety Precautions: Patient was assessed for positional comfort and pressure points before starting the procedure. Time-out: I initiated and conducted the "Time-out" before starting the procedure, as per protocol. The patient was asked to participate by confirming the accuracy of the "Time Out" information. Verification of the correct  person, site, and procedure were performed and confirmed by me, the nursing staff, and the patient. "Time-out" conducted as per Joint Commission's Universal Protocol (UP.01.01.01). "Time-out" Date & Time: 03/28/2017; 1126 hrs.  Description of Procedure Process:   Position: Sitting Target Area: Knee Joint Approach: Just above the Lateral tibial plateau, lateral to the infrapatellar tendon. Area Prepped: Entire knee area, from the mid-thigh to the mid-shin. Prepping solution: ChloraPrep (2% chlorhexidine gluconate and 70% isopropyl alcohol) Safety Precautions: Aspiration looking for blood return was conducted prior to all injections. At no point did we inject any substances, as a needle was being advanced. No attempts were made at seeking any paresthesias. Safe injection practices and needle disposal techniques used. Medications properly checked for expiration dates. SDV (single dose vial) medications used. Description of the Procedure: Protocol guidelines were followed. The patient was placed in position over the fluoroscopy table. The target area was identified and the area prepped in the usual manner. Skin desensitized using vapocoolant spray. Skin & deeper tissues infiltrated with local anesthetic. Appropriate amount of time allowed to pass for local anesthetics to take effect. The procedure needles were then advanced to the target area. Proper needle placement secured. Negative aspiration confirmed. Solution injected in intermittent fashion, asking for systemic symptoms every 0.5cc of injectate. The needles were then removed and the area cleansed, making sure to leave some of the prepping solution back to take advantage of its long term bactericidal properties. Vitals:   03/28/17 1134 03/28/17 1143 03/28/17 1152 03/28/17 1202  BP: 137/66 122/71 112/67 128/73  Pulse:      Resp: 16 16 13 15   Temp:  97.5 F (36.4 C)    SpO2: 95% 96% 95% 100%  Weight:      Height:        Start Time: 1126  hrs. End Time: 1130 hrs. Materials:  Needle(s) Type: Regular needle Gauge: 22G Length: 3.5-in Medication(s): We administered Sodium Hyaluronate, ropivacaine (PF) 2 mg/mL (0.2%), lactated ringers, midazolam, and fentaNYL. Please see chart orders for dosing details.  Imaging Guidance:  Type of Imaging Technique: None used Indication(s): N/A Exposure Time: No patient exposure Contrast: None used. Fluoroscopic Guidance: N/A Ultrasound Guidance: N/A Interpretation: N/A  Antibiotic Prophylaxis:  Indication(s): None identified Antibiotic given: None  Post-operative Assessment:  EBL: None Complications: No immediate post-treatment complications observed by team, or reported by patient. Note: The patient tolerated the entire procedure well. A repeat set of vitals were taken after the procedure and the patient was kept under observation following institutional policy, for this type of procedure. Post-procedural neurological assessment was performed, showing return to baseline, prior to discharge. The patient was provided with post-procedure discharge instructions, including a section on how to identify potential problems. Should any problems arise concerning this procedure, the patient was given instructions to immediately contact us, at any time, without hesitation. In any case, we plan to contact the patient by telephone for a follow-up status report regarding this interventional procedure. Comments:  No additional relevant information.  Plan of Care  Disposition: Discharge home  Discharge Date & Time: 03/28/2017;   hrs.  Physician-requested Follow-up:  Return in about 2 weeks (around 04/11/2017) for procedure (w/ sedation):, (2 wks), by MD.  Future Appointments Date Time Provider [place]  04/11/2017 8:15 AM Milinda Pointer, MD [facility] None  04/18/2017 10:00 AM Leone Haven, MD [facility] None  05/15/2017 9:15 AM Vevelyn Francois, NP [facility] None   Medications ordered  for procedure: Meds ordered this encounter  Medications  . Sodium Hyaluronate SOSY 2 mL  . lidocaine (PF) (XYLOCAINE) 1 % injection 5 mL  . ropivacaine (PF) 2 mg/mL (0.2%) (NAROPIN) injection 5 mL  . lactated ringers infusion 1,000 mL  . midazolam (VERSED) 5 MG/5ML injection 1-2 mg    Make sure Flumazenil is available in the pyxis when using this medication. If oversedation occurs, administer 0.2 mg IV over 15 sec. If after 45 sec no response, administer 0.2 mg again over 1 min; may repeat at 1 min intervals; not to exceed 4 doses (1 mg)  . fentaNYL (SUBLIMAZE) injection 25-50 mcg    Make sure Narcan is available in the pyxis when using this medication. In the event of respiratory depression (RR< 8/min): Titrate NARCAN (naloxone) in increments of 0.1 to 0.2 mg IV at 2-3 minute intervals, until desired degree of reversal.   Medications administered: We administered Sodium Hyaluronate, ropivacaine (PF) 2 mg/mL (0.2%), lactated ringers, midazolam, and fentaNYL.  See the medical record for exact dosing, route, and time of administration.  Lab-work, Procedure(s), & Referral(s) Ordered: Orders Placed This Encounter  Procedures  . KNEE INJECTION  . KNEE INJECTION  . Informed Consent Details: Transcribe to consent form and obtain patient signature  . Provider attestation of informed consent for procedure/surgical case  . Verify informed consent  . Discharge instructions  . Follow-up   Imaging Ordered: Results for orders placed in visit on 01/31/17  DG C-Arm 1-60 Min-No Report   Narrative Fluoroscopy was utilized by the requesting physician.  No radiographic  interpretation.    New Prescriptions   No medications on file   Primary Care Physician: Leone Haven, MD Location: [facility] Note by: Kathlen Brunswick. Dossie Arbour, M.D, DABA, DABAPM, DABPM, DABIPP, FIPP Date: 03/28/2017; Time: 12:03 PM  Disclaimer:  Medicine is not an Chief Strategy Officer. The only guarantee  in medicine is that nothing is guaranteed. It is important to note that the decision to proceed with this intervention was based on the information collected from the patient. The Data  and conclusions were drawn from the patient's questionnaire, the interview, and the physical examination. Because the information was provided in large part by the patient, it cannot be guaranteed that it has not been purposely or unconsciously manipulated. Every effort has been made to obtain as much relevant data as possible for this evaluation. It is important to note that the conclusions that lead to this procedure are derived in large part from the available data. Always take into account that the treatment will also be dependent on availability of resources and existing treatment guidelines, considered by other Pain Management Practitioners as being common knowledge and practice, at the time of the intervention. For Medico-Legal purposes, it is also important to point out that variation in procedural techniques and pharmacological choices are the acceptable norm. The indications, contraindications, technique, and results of the above procedure should only be interpreted and judged by a Board-Certified Interventional Pain Specialist with extensive familiarity and expertise in the same exact procedure and technique.  Instructions provided at this appointment: Patient Instructions  Preparing for your procedure (without sedation) Instructions: . Oral Intake: Do not eat or drink anything for at least 3 hours prior to your procedure. . Transportation: Unless otherwise stated by your physician, you may drive yourself after the procedure. . Blood Pressure Medicine: Take your blood pressure medicine with a sip of water the morning of the procedure. . Blood thinners:  . Diabetics on insulin: Notify the staff so that you can be scheduled 1st case in the morning. If your diabetes requires high dose insulin, take only  of your  normal insulin dose the morning of the procedure and notify the staff that you have done so. . Preventing infections: Shower with an antibacterial soap the morning of your procedure.  . Build-up your immune system: Take 1000 mg of Vitamin C with every meal (3 times a day) the day prior to your procedure. [place] Antibiotics: Inform the staff if you have a condition or reason that requires you to take antibiotics before dental procedures. . Pregnancy: If you are pregnant, call and cancel the procedure. . Sickness: If you have a cold, fever, or any active infections, call and cancel the procedure. . Arrival: You must be in the facility at least 30 minutes prior to your scheduled procedure. . Children: Do not bring any children with you. . Dress appropriately: Bring dark clothing that you would not mind if they get stained. . Valuables: Do not bring any jewelry or valuables. Procedure appointments are reserved for interventional treatments only. [place] No Prescription Refills. . No medication changes will be discussed during procedure appointments. . No disability issues will be discussed. _____________________________________________________________________________________________  Pain Management Discharge Instructions  General Discharge Instructions :  If you need to reach your doctor call: Monday-Friday 8:00 am - 4:00 pm at [phone] or toll free [phone].  After clinic hours [phone] to have operator reach doctor.  Bring all of your medication bottles to all your appointments in the pain clinic.  To cancel or reschedule your appointment with Pain Management please remember to call 24 hours in advance to avoid a fee.  Refer to the educational materials which you have been given on: General Risks, I had my Procedure. Discharge Instructions, Post Sedation.  Post Procedure Instructions:  The drugs you were given will stay in your system until tomorrow, so for the next 24 hours you  should not drive, make any legal decisions or drink any alcoholic beverages.  You may eat anything you prefer,  but it is better to start with liquids then soups and crackers, and gradually work up to solid foods.  Please notify your doctor immediately if you have any unusual bleeding, trouble breathing or pain that is not related to your normal pain.  Depending on the type of procedure that was done, some parts of your body may feel week and/or numb.  This usually clears up by tonight or the next day.  Walk with the use of an assistive device or accompanied by an adult for the 24 hours.  You may use ice on the affected area for the first 24 hours.  Put ice in a Ziploc bag and cover with a towel and place against area 15 minutes on 15 minutes off.  You may switch to heat after 24 hours.

## 2017-03-29 ENCOUNTER — Telehealth: Payer: Self-pay | Admitting: *Deleted

## 2017-03-29 NOTE — Telephone Encounter (Signed)
Denies complications post procedure. 

## 2017-04-03 ENCOUNTER — Telehealth: Payer: Self-pay | Admitting: Nurse Practitioner

## 2017-04-03 NOTE — Telephone Encounter (Signed)
Pharmacy called requesting to verify refill dates on scripts stating  They think the patient will run out of meds before refill date

## 2017-04-03 NOTE — Telephone Encounter (Signed)
I have already discussed with pharmacy and dates have been adjusted.

## 2017-04-06 ENCOUNTER — Telehealth: Payer: Self-pay | Admitting: Pain Medicine

## 2017-04-06 NOTE — Telephone Encounter (Signed)
Attempted to call patient.  Left message.  

## 2017-04-06 NOTE — Telephone Encounter (Signed)
Had stomach bug couple days ago. She felt like an explosion of pain in her neck and is still experiencing a lot of pain. Please call patient to discuss and let her know if she should get any xrays.

## 2017-04-10 NOTE — Telephone Encounter (Signed)
Called patient and she states that she had talked to Dr Dossie Arbour over the weekend and that he had told her that it was probably a pinched nerve and she should give it some time to heal.  Informed patient to call us if needed.

## 2017-04-11 ENCOUNTER — Ambulatory Visit: Payer: BLUE CROSS/BLUE SHIELD | Admitting: Pain Medicine

## 2017-04-16 ENCOUNTER — Ambulatory Visit: Payer: BLUE CROSS/BLUE SHIELD | Attending: Pain Medicine | Admitting: Pain Medicine

## 2017-04-16 ENCOUNTER — Encounter: Payer: Self-pay | Admitting: Pain Medicine

## 2017-04-16 VITALS — BP 139/73 | HR 65 | Temp 97.7°F | Resp 18 | Ht 64.0 in | Wt 170.0 lb

## 2017-04-16 DIAGNOSIS — F419 Anxiety disorder, unspecified: Secondary | ICD-10-CM | POA: Insufficient documentation

## 2017-04-16 DIAGNOSIS — F329 Major depressive disorder, single episode, unspecified: Secondary | ICD-10-CM | POA: Diagnosis not present

## 2017-04-16 DIAGNOSIS — M25561 Pain in right knee: Secondary | ICD-10-CM | POA: Diagnosis not present

## 2017-04-16 DIAGNOSIS — Z9851 Tubal ligation status: Secondary | ICD-10-CM | POA: Diagnosis not present

## 2017-04-16 DIAGNOSIS — M797 Fibromyalgia: Secondary | ICD-10-CM | POA: Diagnosis not present

## 2017-04-16 DIAGNOSIS — M1711 Unilateral primary osteoarthritis, right knee: Secondary | ICD-10-CM | POA: Insufficient documentation

## 2017-04-16 DIAGNOSIS — Z9049 Acquired absence of other specified parts of digestive tract: Secondary | ICD-10-CM | POA: Diagnosis not present

## 2017-04-16 DIAGNOSIS — G8929 Other chronic pain: Secondary | ICD-10-CM | POA: Diagnosis not present

## 2017-04-16 DIAGNOSIS — F32A Depression, unspecified: Secondary | ICD-10-CM

## 2017-04-16 MED ORDER — LACTATED RINGERS IV SOLN
1000.0000 mL | Freq: Once | INTRAVENOUS | Status: AC
  Administered 2017-04-16: 1000 mL via INTRAVENOUS

## 2017-04-16 MED ORDER — SODIUM HYALURONATE (VISCOSUP) 20 MG/2ML IX SOSY
2.0000 mL | PREFILLED_SYRINGE | Freq: Once | INTRA_ARTICULAR | Status: AC
  Administered 2017-04-16: 08:00:00 via INTRA_ARTICULAR
  Filled 2017-04-16: qty 2

## 2017-04-16 MED ORDER — ROPIVACAINE HCL 2 MG/ML IJ SOLN
5.0000 mL | Freq: Once | INTRAMUSCULAR | Status: AC
  Administered 2017-04-16: 10 mL via INTRA_ARTICULAR
  Filled 2017-04-16: qty 10

## 2017-04-16 MED ORDER — FENTANYL CITRATE (PF) 100 MCG/2ML IJ SOLN
25.0000 ug | INTRAMUSCULAR | Status: DC | PRN
  Administered 2017-04-16: 50 ug via INTRAVENOUS
  Filled 2017-04-16: qty 2

## 2017-04-16 MED ORDER — MIDAZOLAM HCL 5 MG/5ML IJ SOLN
1.0000 mg | INTRAMUSCULAR | Status: DC | PRN
  Administered 2017-04-16: 3 mg via INTRAVENOUS
  Filled 2017-04-16: qty 5

## 2017-04-16 MED ORDER — GABAPENTIN 300 MG PO CAPS: 300.0000 mg | ORAL_CAPSULE | Freq: Three times a day (TID) | ORAL | 2 refills | Status: DC

## 2017-04-16 MED ORDER — LIDOCAINE HCL (PF) 1 % IJ SOLN
5.0000 mL | Freq: Once | INTRAMUSCULAR | Status: AC
  Administered 2017-04-16: 5 mL
  Filled 2017-04-16: qty 5

## 2017-04-16 NOTE — Progress Notes (Signed)
Patient's Name: Nicole Walter  MRN: [id number]  Referring Provider: Leone Haven, MD  DOB: 1961/07/13  PCP: Leone Haven, MD  DOS: 04/16/2017  Note by: Kathlen Brunswick. Dossie Arbour, MD  Service setting: Ambulatory outpatient  Location: [facility] (AMB) Pain Management Facility  Visit type: Procedure  Specialty: Interventional Pain Management  Patient type: Established   Primary Reason for Visit: Interventional Pain Management Treatment. CC: Knee Pain (RIGHT)  Procedure:  Anesthesia, Analgesia, Anxiolysis:  Type: Therapeutic Intra-Articular Hyalgan Knee Injection #3 Region: Lateral infrapatellar Knee Region Level: Knee Joint Laterality: Right knee  Type: Local Anesthesia with Moderate (Conscious) Sedation Local Anesthetic: Lidocaine 1% Route: Intravenous (IV) IV Access: Secured Sedation: Meaningful verbal contact was maintained at all times during the procedure  Indication(s): Analgesia and Anxiety  Indications: 1. Chronic knee pain (Right)   2. Osteoarthritis of knee (Right)   3. Chronic knee arthropathy (Right)   4. Anxiety and depression   5. Fibromyalgia    Pain Score: Pre-procedure: 0-No pain/10 Post-procedure: 0-No pain/10  Pre-op Assessment:  Previous date of service: 03/28/17 Service provided: Procedure (right knee hyalgan #2) Nicole Walter is a 56 y.o. (year old), female patient, seen today for interventional treatment. She  has a past surgical history that includes Cholecystectomy; Tubal ligation; Ankle surgery; Nasal sinus surgery; Tonsillectomy; Colonoscopy; Esophagogastroduodenoscopy (egd) with propofol (N/A, 02/22/2016); Colonoscopy with propofol (N/A, 02/22/2016); and Breast biopsy (Left, 1997). Her primarily concern today is the Knee Pain (RIGHT)  Initial Vital Signs: There were no vitals taken for this visit. BMI: 29.18 kg/m  Risk Assessment: Allergies: Reviewed. She is allergic to other and food.  Allergy Precautions: None required Coagulopathies: Reviewed. None  identified.  Blood-thinner therapy: None at this time Active Infection(s): Reviewed. None identified. Nicole Walter is afebrile  Site Confirmation: Nicole Walter was asked to confirm the procedure and laterality before marking the site Procedure checklist: Completed Consent: Before the procedure and under the influence of no sedative(s), amnesic(s), or anxiolytics, the patient was informed of the treatment options, risks and possible complications. To fulfill our ethical and legal obligations, as recommended by the American Medical Association's Code of Ethics, I have informed the patient of my clinical impression; the nature and purpose of the treatment or procedure; the risks, benefits, and possible complications of the intervention; the alternatives, including doing nothing; the risk(s) and benefit(s) of the alternative treatment(s) or procedure(s); and the risk(s) and benefit(s) of doing nothing. The patient was provided information about the general risks and possible complications associated with the procedure. These may include, but are not limited to: failure to achieve desired goals, infection, bleeding, organ or nerve damage, allergic reactions, paralysis, and death. In addition, the patient was informed of those risks and complications associated to the procedure, such as failure to decrease pain; infection; bleeding; organ or nerve damage with subsequent damage to sensory, motor, and/or autonomic systems, resulting in permanent pain, numbness, and/or weakness of one or several areas of the body; allergic reactions; (i.e.: anaphylactic reaction); and/or death. Furthermore, the patient was informed of those risks and complications associated with the medications. These include, but are not limited to: allergic reactions (i.e.: anaphylactic or anaphylactoid reaction(s)); adrenal axis suppression; blood sugar elevation that in diabetics may result in ketoacidosis or comma; water retention that in patients  with history of congestive heart failure may result in shortness of breath, pulmonary edema, and decompensation with resultant heart failure; weight gain; swelling or edema; medication-induced neural toxicity; particulate matter embolism and blood vessel occlusion with resultant organ,  and/or nervous system infarction; and/or aseptic necrosis of one or more joints. Finally, the patient was informed that Medicine is not an exact science; therefore, there is also the possibility of unforeseen or unpredictable risks and/or possible complications that may result in a catastrophic outcome. The patient indicated having understood very clearly. We have given the patient no guarantees and we have made no promises. Enough time was given to the patient to ask questions, all of which were answered to the patient's satisfaction. Nicole Walter has indicated that she wanted to continue with the procedure. Attestation: I, the ordering provider, attest that I have discussed with the patient the benefits, risks, side-effects, alternatives, likelihood of achieving goals, and potential problems during recovery for the procedure that I have provided informed consent. Date: 04/16/2017; Time: 7:43 AM  Pre-Procedure Preparation:  Monitoring: As per clinic protocol. Respiration, ETCO2, SpO2, BP, heart rate and rhythm monitor placed and checked for adequate function Safety Precautions: Patient was assessed for positional comfort and pressure points before starting the procedure. Time-out: I initiated and conducted the "Time-out" before starting the procedure, as per protocol. The patient was asked to participate by confirming the accuracy of the "Time Out" information. Verification of the correct person, site, and procedure were performed and confirmed by me, the nursing staff, and the patient. "Time-out" conducted as per Joint Commission's Universal Protocol (UP.01.01.01). "Time-out" Date & Time: 04/16/2017; 0817 hrs.  Description of  Procedure Process:   Position: Sitting Target Area: Knee Joint Approach: Just above the Lateral tibial plateau, lateral to the infrapatellar tendon. Area Prepped: Entire knee area, from the mid-thigh to the mid-shin. Prepping solution: ChloraPrep (2% chlorhexidine gluconate and 70% isopropyl alcohol) Safety Precautions: Aspiration looking for blood return was conducted prior to all injections. At no point did we inject any substances, as a needle was being advanced. No attempts were made at seeking any paresthesias. Safe injection practices and needle disposal techniques used. Medications properly checked for expiration dates. SDV (single dose vial) medications used. Description of the Procedure: Protocol guidelines were followed. The patient was placed in position over the fluoroscopy table. The target area was identified and the area prepped in the usual manner. Skin desensitized using vapocoolant spray. Skin & deeper tissues infiltrated with local anesthetic. Appropriate amount of time allowed to pass for local anesthetics to take effect. The procedure needles were then advanced to the target area. Proper needle placement secured. Negative aspiration confirmed. Solution injected in intermittent fashion, asking for systemic symptoms every 0.5cc of injectate. The needles were then removed and the area cleansed, making sure to leave some of the prepping solution back to take advantage of its long term bactericidal properties. Vitals:   04/16/17 0823 04/16/17 0833 04/16/17 0843 04/16/17 0855  BP: 104/70 (!) 96/47 113/72 139/73  Pulse: 62 60 65 65  Resp: 14 20 14 18   Temp:  98 F (36.7 C)  97.7 F (36.5 C)  SpO2: 94% 93% 96% 99%  Weight:      Height:        Start Time: 0817 hrs. End Time: 0820 hrs. Materials:  Needle(s) Type: Regular needle Gauge: 22G Length: 3.5-in Medication(s): We administered Sodium Hyaluronate, lidocaine (PF), ropivacaine (PF) 2 mg/mL (0.2%), lactated ringers, midazolam,  and fentaNYL. Please see chart orders for dosing details.  Imaging Guidance:  Type of Imaging Technique: None used Indication(s): N/A Exposure Time: No patient exposure Contrast: None used. Fluoroscopic Guidance: N/A Ultrasound Guidance: N/A Interpretation: N/A  Antibiotic Prophylaxis:  Indication(s): None identified Antibiotic  given: None  Post-operative Assessment:  EBL: None Complications: No immediate post-treatment complications observed by team, or reported by patient. Note: The patient tolerated the entire procedure well. A repeat set of vitals were taken after the procedure and the patient was kept under observation following institutional policy, for this type of procedure. Post-procedural neurological assessment was performed, showing return to baseline, prior to discharge. The patient was provided with post-procedure discharge instructions, including a section on how to identify potential problems. Should any problems arise concerning this procedure, the patient was given instructions to immediately contact us, at any time, without hesitation. In any case, we plan to contact the patient by telephone for a follow-up status report regarding this interventional procedure. Comments:  No additional relevant information.  Plan of Care  Disposition: Discharge home  Discharge Date & Time: 04/16/2017; 0900 hrs.  Physician-requested Follow-up:  Return in about 2 weeks (around 04/30/2017) for NS procedure, by MD.  Future Appointments Date Time Provider [place]  04/18/2017 10:00 AM Leone Haven, MD [facility] None  04/30/2017 8:00 AM Milinda Pointer, MD [facility] None  05/15/2017 9:15 AM Vevelyn Francois, NP [facility] None   Medications ordered for procedure: Meds ordered this encounter  Medications  . Sodium Hyaluronate SOSY 2 mL  . lidocaine (PF) (XYLOCAINE) 1 % injection 5 mL  . ropivacaine (PF) 2 mg/mL (0.2%) (NAROPIN) injection 5 mL  . lactated ringers infusion  1,000 mL  . midazolam (VERSED) 5 MG/5ML injection 1-2 mg    Make sure Flumazenil is available in the pyxis when using this medication. If oversedation occurs, administer 0.2 mg IV over 15 sec. If after 45 sec no response, administer 0.2 mg again over 1 min; may repeat at 1 min intervals; not to exceed 4 doses (1 mg)  . fentaNYL (SUBLIMAZE) injection 25-50 mcg    Make sure Narcan is available in the pyxis when using this medication. In the event of respiratory depression (RR< 8/min): Titrate NARCAN (naloxone) in increments of 0.1 to 0.2 mg IV at 2-3 minute intervals, until desired degree of reversal.  . gabapentin (NEURONTIN) 300 MG capsule    Sig: Take 1-3 capsules (300-900 mg total) by mouth every 8 (eight) hours. Follow titration schedule    Dispense:  270 capsule    Refill:  2    Do not place this medication, or any other prescription from our practice, on "Automatic Refill". Patient may have prescription filled one day early if pharmacy is closed on scheduled refill date.   Medications administered: We administered Sodium Hyaluronate, lidocaine (PF), ropivacaine (PF) 2 mg/mL (0.2%), lactated ringers, midazolam, and fentaNYL.  See the medical record for exact dosing, route, and time of administration.  Lab-work, Procedure(s), & Referral(s) Ordered: Orders Placed This Encounter  Procedures  . KNEE INJECTION  . KNEE INJECTION  . Informed Consent Details: Transcribe to consent form and obtain patient signature  . Provider attestation of informed consent for procedure/surgical case  . Verify informed consent  . Discharge instructions  . Follow-up   Imaging Ordered: Results for orders placed in visit on 01/31/17  DG C-Arm 1-60 Min-No Report   Narrative Fluoroscopy was utilized by the requesting physician.  No radiographic  interpretation.    New Prescriptions   No medications on file   Primary Care Physician: Leone Haven, MD Location: [facility]  Facility Note by: Kathlen Brunswick. Dossie Arbour, M.D, DABA, DABAPM, DABPM, DABIPP, FIPP Date: 04/16/2017; Time: 1:36 PM  Disclaimer:  Medicine is not an  exact science. The only guarantee in medicine is that nothing is guaranteed. It is important to note that the decision to proceed with this intervention was based on the information collected from the patient. The Data and conclusions were drawn from the patient's questionnaire, the interview, and the physical examination. Because the information was provided in large part by the patient, it cannot be guaranteed that it has not been purposely or unconsciously manipulated. Every effort has been made to obtain as much relevant data as possible for this evaluation. It is important to note that the conclusions that lead to this procedure are derived in large part from the available data. Always take into account that the treatment will also be dependent on availability of resources and existing treatment guidelines, considered by other Pain Management Practitioners as being common knowledge and practice, at the time of the intervention. For Medico-Legal purposes, it is also important to point out that variation in procedural techniques and pharmacological choices are the acceptable norm. The indications, contraindications, technique, and results of the above procedure should only be interpreted and judged by a Board-Certified Interventional Pain Specialist with extensive familiarity and expertise in the same exact procedure and technique.  Instructions provided at this appointment: Patient Instructions   ____________________________________________________________________________________________  Post-Procedure instructions Instructions:  Apply ice: Fill a plastic sandwich bag with crushed ice. Cover it with a small towel and apply to injection site. Apply for 15 minutes then remove x 15 minutes. Repeat sequence on day of procedure, until you go to bed. The purpose  is to minimize swelling and discomfort after procedure.  Apply heat: Apply heat to procedure site starting the day following the procedure. The purpose is to treat any soreness and discomfort from the procedure.  Food intake: Start with clear liquids (like water) and advance to regular food, as tolerated.   Physical activities: Keep activities to a minimum for the first 8 hours after the procedure.   Driving: If you have received any sedation, you are not allowed to drive for 24 hours after your procedure.  Blood thinner: Restart your blood thinner 6 hours after your procedure. (Only for those taking blood thinners)  Insulin: As soon as you can eat, you may resume your normal dosing schedule. (Only for those taking insulin)  Infection prevention: Keep procedure site clean and dry.  Post-procedure Pain Diary: Extremely important that this be done correctly and accurately. Recorded information will be used to determine the next step in treatment.  Pain evaluated is that of treated area only. Do not include pain from an untreated area.  Complete every hour, on the hour, for the initial 8 hours. Set an alarm to help you do this part accurately.  Do not go to sleep and have it completed later. It will not be accurate.  Follow-up appointment: Keep your follow-up appointment after the procedure. Usually 2 weeks for most procedures. (6 weeks in the case of radiofrequency.) Bring you pain diary.  Expect:  From numbing medicine (AKA: Local Anesthetics): Numbness or decrease in pain.  Onset: Full effect within 15 minutes of injected.  Duration: It will depend on the type of local anesthetic used. On the average, 1 to 8 hours.   From steroids: Decrease in swelling or inflammation. Once inflammation is improved, relief of the pain will follow.  Onset of benefits: Depends on the amount of swelling present. The more swelling, the longer it will take for the benefits to be seen.   Duration:  Steroids will stay  in the system x 2 weeks. Duration of benefits will depend on multiple posibilities including persistent irritating factors.  From procedure: Some discomfort is to be expected once the numbing medicine wears off. This should be minimal if ice and heat are applied as instructed. Call if:  You experience numbness and weakness that gets worse with time, as opposed to wearing off.  New onset bowel or bladder incontinence. (Spinal procedures only)  Emergency Numbers:  Durning business hours (Monday - Thursday, 8:00 AM - 4:00 PM) (Friday, 9:00 AM - 12:00 Noon): [phone]  After hours: [phone] ____________________________________________________________________________________________  ____________________________________________________________________________________________  Preparing for Procedure with Sedation Instructions: . Oral Intake: Do not eat or drink anything for at least 8 hours prior to your procedure. . Transportation: Public transportation is not allowed. Bring an adult driver. The driver must be physically present in our waiting room before any procedure can be started. [place] Physical Assistance: Bring an adult physically capable of assisting you, in the event you need help. This adult should keep you company at home for at least 6 hours after the procedure. . Blood Pressure Medicine: Take your blood pressure medicine with a sip of water the morning of the procedure. . Blood thinners:  . Diabetics on insulin: Notify the staff so that you can be scheduled 1st case in the morning. If your diabetes requires high dose insulin, take only  of your normal insulin dose the morning of the procedure and notify the staff that you have done so. . Preventing infections: Shower with an antibacterial soap the morning of your procedure. . Build-up your immune system: Take 1000 mg of Vitamin C with every meal (3 times a day) the day prior to your  procedure. Marland Kitchen Antibiotics: Inform the staff if you have a condition or reason that requires you to take antibiotics before dental procedures. . Pregnancy: If you are pregnant, call and cancel the procedure. . Sickness: If you have a cold, fever, or any active infections, call and cancel the procedure. . Arrival: You must be in the facility at least 30 minutes prior to your scheduled procedure. . Children: Do not bring children with you. . Dress appropriately: Bring dark clothing that you would not mind if they get stained. . Valuables: Do not bring any jewelry or valuables. Procedure appointments are reserved for interventional treatments only. Marland Kitchen No Prescription Refills. . No medication changes will be discussed during procedure appointments. . No disability issues will be discussed. ____________________________________________________________________________________________  GENERAL RISKS AND COMPLICATIONS  What are the risk, side effects and possible complications? Generally speaking, most procedures are safe.  However, with any procedure there are risks, side effects, and the possibility of complications.  The risks and complications are dependent upon the sites that are lesioned, or the type of nerve block to be performed.  The closer the procedure is to the spine, the more serious the risks are.  Great care is taken when placing the radio frequency needles, block needles or lesioning probes, but sometimes complications can occur. Infection: Any time there is an injection through the skin, there is a risk of infection.  This is why sterile conditions are used for these blocks.  There are four possible types of infection. Localized skin infection. Central Nervous System Infection-This can be in the form of Meningitis, which can be deadly. Epidural Infections-This can be in the form of an epidural abscess, which can cause pressure inside of the spine, causing compression of the spinal cord with  subsequent  paralysis. This would require an emergency surgery to decompress, and there are no guarantees that the patient would recover from the paralysis. Discitis-This is an infection of the intervertebral discs.  It occurs in about 1% of discography procedures.  It is difficult to treat and it may lead to surgery.        2. Pain: the needles have to go through skin and soft tissues, will cause soreness.       3. Damage to internal structures:  The nerves to be lesioned may be near blood vessels or    other nerves which can be potentially damaged.       4. Bleeding: Bleeding is more common if the patient is taking blood thinners such as  aspirin, Coumadin, Ticiid, Plavix, etc., or if he/she have some genetic predisposition  such as hemophilia. Bleeding into the spinal canal can cause compression of the spinal  cord with subsequent paralysis.  This would require an emergency surgery to  decompress and there are no guarantees that the patient would recover from the  paralysis.       5. Pneumothorax:  Puncturing of a lung is a possibility, every time a needle is introduced in  the area of the chest or upper back.  Pneumothorax refers to free air around the  collapsed lung(s), inside of the thoracic cavity (chest cavity).  Another two possible  complications related to a similar event would include: Hemothorax and Chylothorax.   These are variations of the Pneumothorax, where instead of air around the collapsed  lung(s), you may have blood or chyle, respectively.       6. Spinal headaches: They may occur with any procedures in the area of the spine.       7. Persistent CSF (Cerebro-Spinal Fluid) leakage: This is a rare problem, but may occur  with prolonged intrathecal or epidural catheters either due to the formation of a fistulous  track or a dural tear.       8. Nerve damage: By working so close to the spinal cord, there is always a possibility of  nerve damage, which could be as serious as a permanent  spinal cord injury with  paralysis.       9. Death:  Although rare, severe deadly allergic reactions known as "Anaphylactic  reaction" can occur to any of the medications used.      10. Worsening of the symptoms:  We can always make thing worse.  What are the chances of something like this happening? Chances of any of this occuring are extremely low.  By statistics, you have more of a chance of getting killed in a motor vehicle accident: while driving to the hospital than any of the above occurring .  Nevertheless, you should be aware that they are possibilities.  In general, it is similar to taking a shower.  Everybody knows that you can slip, hit your head and get killed.  Does that mean that you should not shower again?  Nevertheless always keep in mind that statistics do not mean anything if you happen to be on the wrong side of them.  Even if a procedure has a 1 (one) in a 1,000,000 (million) chance of going wrong, it you happen to be that one..Also, keep in mind that by statistics, you have more of a chance of having something go wrong when taking medications.  Who should not have this procedure? If you are on a blood thinning medication (e.g. Coumadin, Plavix, see list  of "Blood Thinners"), or if you have an active infection going on, you should not have the procedure.  If you are taking any blood thinners, please inform your physician.  How should I prepare for this procedure? Do not eat or drink anything at least six hours prior to the procedure. Bring a driver with you .  It cannot be a taxi. Come accompanied by an adult that can drive you back, and that is strong enough to help you if your legs get weak or numb from the local anesthetic. Take all of your medicines the morning of the procedure with just enough water to swallow them. If you have diabetes, make sure that you are scheduled to have your procedure done first thing in the morning, whenever possible. If you have diabetes, take  only half of your insulin dose and notify our nurse that you have done so as soon as you arrive at the clinic. If you are diabetic, but only take blood sugar pills (oral hypoglycemic), then do not take them on the morning of your procedure.  You may take them after you have had the procedure. Do not take aspirin or any aspirin-containing medications, at least eleven (11) days prior to the procedure.  They may prolong bleeding. Wear loose fitting clothing that may be easy to take off and that you would not mind if it got stained with Betadine or blood. Do not wear any jewelry or perfume Remove any nail coloring.  It will interfere with some of our monitoring equipment.  NOTE: Remember that this is not meant to be interpreted as a complete list of all possible complications.  Unforeseen problems may occur.  BLOOD THINNERS The following drugs contain aspirin or other products, which can cause increased bleeding during surgery and should not be taken for 2 weeks prior to and 1 week after surgery.  If you should need take something for relief of minor pain, you may take acetaminophen which is found in Tylenol,m Datril, Anacin-3 and Panadol. It is not blood thinner. The products listed below are.  Do not take any of the products listed below in addition to any listed on your instruction sheet.  A.P.C or A.P.C with Codeine Codeine Phosphate Capsules #3 Ibuprofen Ridaura  ABC compound Congesprin Imuran rimadil  Advil Cope Indocin Robaxisal  Alka-Seltzer Effervescent Pain Reliever and Antacid Coricidin or Coricidin-D  Indomethacin Rufen  Alka-Seltzer plus Cold Medicine Cosprin Ketoprofen S-A-C Tablets  Anacin Analgesic Tablets or Capsules Coumadin Korlgesic Salflex  Anacin Extra Strength Analgesic tablets or capsules CP-2 Tablets Lanoril Salicylate  Anaprox Cuprimine Capsules Levenox Salocol  Anexsia-D Dalteparin Magan Salsalate  Anodynos Darvon compound Magnesium Salicylate Sine-off  Ansaid Dasin  Capsules Magsal Sodium Salicylate  Anturane Depen Capsules Marnal Soma  APF Arthritis pain formula Dewitt's Pills Measurin Stanback  Argesic Dia-Gesic Meclofenamic Sulfinpyrazone  Arthritis Bayer Timed Release Aspirin Diclofenac Meclomen Sulindac  Arthritis pain formula Anacin Dicumarol Medipren Supac  Analgesic (Safety coated) Arthralgen Diffunasal Mefanamic Suprofen  Arthritis Strength Bufferin Dihydrocodeine Mepro Compound Suprol  Arthropan liquid Dopirydamole Methcarbomol with Aspirin Synalgos  ASA tablets/Enseals Disalcid Micrainin Tagament  Ascriptin Doan's Midol Talwin  Ascriptin A/D Dolene Mobidin Tanderil  Ascriptin Extra Strength Dolobid Moblgesic Ticlid  Ascriptin with Codeine Doloprin or Doloprin with Codeine Momentum Tolectin  Asperbuf Duoprin Mono-gesic Trendar  Aspergum Duradyne Motrin or Motrin IB Triminicin  Aspirin plain, buffered or enteric coated Durasal Myochrisine Trigesic  Aspirin Suppositories Easprin Nalfon Trillsate  Aspirin with Codeine Ecotrin Regular or Extra Strength  Naprosyn Uracel  Atromid-S Efficin Naproxen Ursinus  Auranofin Capsules Elmiron Neocylate Vanquish  Axotal Emagrin Norgesic Verin  Azathioprine Empirin or Empirin with Codeine Normiflo Vitamin E  Azolid Emprazil Nuprin Voltaren  Bayer Aspirin plain, buffered or children's or timed BC Tablets or powders Encaprin Orgaran Warfarin Sodium  Buff-a-Comp Enoxaparin Orudis Zorpin  Buff-a-Comp with Codeine Equegesic Os-Cal-Gesic   Buffaprin Excedrin plain, buffered or Extra Strength Oxalid   Bufferin Arthritis Strength Feldene Oxphenbutazone   Bufferin plain or Extra Strength Feldene Capsules Oxycodone with Aspirin   Bufferin with Codeine Fenoprofen Fenoprofen Pabalate or Pabalate-SF   Buffets II Flogesic Panagesic   Buffinol plain or Extra Strength Florinal or Florinal with Codeine Panwarfarin   Buf-Tabs Flurbiprofen Penicillamine   Butalbital Compound Four-way cold tablets Penicillin    Butazolidin Fragmin Pepto-Bismol   Carbenicillin Geminisyn Percodan   Carna Arthritis Reliever Geopen Persantine   Carprofen Gold's salt Persistin   Chloramphenicol Goody's Phenylbutazone   Chloromycetin Haltrain Piroxlcam   Clmetidine heparin Plaquenil   Cllnoril Hyco-pap Ponstel   Clofibrate Hydroxy chloroquine Propoxyphen         Before stopping any of these medications, be sure to consult the physician who ordered them.  Some, such as Coumadin (Warfarin) are ordered to prevent or treat serious conditions such as "deep thrombosis", "pumonary embolisms", and other heart problems.  The amount of time that you may need off of the medication may also vary with the medication and the reason for which you were taking it.  If you are taking any of these medications, please make sure you notify your pain physician before you undergo any procedures.

## 2017-04-16 NOTE — Patient Instructions (Addendum)
____________________________________________________________________________________________  Post-Procedure instructions Instructions:  Apply ice: Fill a plastic sandwich bag with crushed ice. Cover it with a small towel and apply to injection site. Apply for 15 minutes then remove x 15 minutes. Repeat sequence on day of procedure, until you go to bed. The purpose is to minimize swelling and discomfort after procedure.  Apply heat: Apply heat to procedure site starting the day following the procedure. The purpose is to treat any soreness and discomfort from the procedure.  Food intake: Start with clear liquids (like water) and advance to regular food, as tolerated.   Physical activities: Keep activities to a minimum for the first 8 hours after the procedure.   Driving: If you have received any sedation, you are not allowed to drive for 24 hours after your procedure.  Blood thinner: Restart your blood thinner 6 hours after your procedure. (Only for those taking blood thinners)  Insulin: As soon as you can eat, you may resume your normal dosing schedule. (Only for those taking insulin)  Infection prevention: Keep procedure site clean and dry.  Post-procedure Pain Diary: Extremely important that this be done correctly and accurately. Recorded information will be used to determine the next step in treatment.  Pain evaluated is that of treated area only. Do not include pain from an untreated area.  Complete every hour, on the hour, for the initial 8 hours. Set an alarm to help you do this part accurately.  Do not go to sleep and have it completed later. It will not be accurate.  Follow-up appointment: Keep your follow-up appointment after the procedure. Usually 2 weeks for most procedures. (6 weeks in the case of radiofrequency.) Bring you pain diary.  Expect:  From numbing medicine (AKA: Local Anesthetics): Numbness or decrease in pain.  Onset: Full effect within 15 minutes of  injected.  Duration: It will depend on the type of local anesthetic used. On the average, 1 to 8 hours.   From steroids: Decrease in swelling or inflammation. Once inflammation is improved, relief of the pain will follow.  Onset of benefits: Depends on the amount of swelling present. The more swelling, the longer it will take for the benefits to be seen.   Duration: Steroids will stay in the system x 2 weeks. Duration of benefits will depend on multiple posibilities including persistent irritating factors.  From procedure: Some discomfort is to be expected once the numbing medicine wears off. This should be minimal if ice and heat are applied as instructed. Call if:  You experience numbness and weakness that gets worse with time, as opposed to wearing off.  New onset bowel or bladder incontinence. (Spinal procedures only)  Emergency Numbers:  Durning business hours (Monday - Thursday, 8:00 AM - 4:00 PM) (Friday, 9:00 AM - 12:00 Noon): [phone]  After hours: [phone] ____________________________________________________________________________________________  ____________________________________________________________________________________________  Preparing for Procedure with Sedation Instructions: . Oral Intake: Do not eat or drink anything for at least 8 hours prior to your procedure. . Transportation: Public transportation is not allowed. Bring an adult driver. The driver must be physically present in our waiting room before any procedure can be started. Marland Kitchen Physical Assistance: Bring an adult physically capable of assisting you, in the event you need help. This adult should keep you company at home for at least 6 hours after the procedure. . Blood Pressure Medicine: Take your blood pressure medicine with a sip of water the morning of the procedure. . Blood thinners:  . Diabetics on insulin:  Notify the staff so that you can be scheduled 1st case in the morning. If  your diabetes requires high dose insulin, take only  of your normal insulin dose the morning of the procedure and notify the staff that you have done so. . Preventing infections: Shower with an antibacterial soap the morning of your procedure. . Build-up your immune system: Take 1000 mg of Vitamin C with every meal (3 times a day) the day prior to your procedure. Marland Kitchen Antibiotics: Inform the staff if you have a condition or reason that requires you to take antibiotics before dental procedures. . Pregnancy: If you are pregnant, call and cancel the procedure. . Sickness: If you have a cold, fever, or any active infections, call and cancel the procedure. . Arrival: You must be in the facility at least 30 minutes prior to your scheduled procedure. . Children: Do not bring children with you. . Dress appropriately: Bring dark clothing that you would not mind if they get stained. . Valuables: Do not bring any jewelry or valuables. Procedure appointments are reserved for interventional treatments only. Marland Kitchen No Prescription Refills. . No medication changes will be discussed during procedure appointments. . No disability issues will be discussed. ____________________________________________________________________________________________  GENERAL RISKS AND COMPLICATIONS  What are the risk, side effects and possible complications? Generally speaking, most procedures are safe.  However, with any procedure there are risks, side effects, and the possibility of complications.  The risks and complications are dependent upon the sites that are lesioned, or the type of nerve block to be performed.  The closer the procedure is to the spine, the more serious the risks are.  Great care is taken when placing the radio frequency needles, block needles or lesioning probes, but sometimes complications can occur. Infection: Any time there is an injection through the skin, there is a risk of infection.  This is why sterile  conditions are used for these blocks.  There are four possible types of infection. Localized skin infection. Central Nervous System Infection-This can be in the form of Meningitis, which can be deadly. Epidural Infections-This can be in the form of an epidural abscess, which can cause pressure inside of the spine, causing compression of the spinal cord with subsequent paralysis. This would require an emergency surgery to decompress, and there are no guarantees that the patient would recover from the paralysis. Discitis-This is an infection of the intervertebral discs.  It occurs in about 1% of discography procedures.  It is difficult to treat and it may lead to surgery.        2. Pain: the needles have to go through skin and soft tissues, will cause soreness.       3. Damage to internal structures:  The nerves to be lesioned may be near blood vessels or    other nerves which can be potentially damaged.       4. Bleeding: Bleeding is more common if the patient is taking blood thinners such as  aspirin, Coumadin, Ticiid, Plavix, etc., or if he/she have some genetic predisposition  such as hemophilia. Bleeding into the spinal canal can cause compression of the spinal  cord with subsequent paralysis.  This would require an emergency surgery to  decompress and there are no guarantees that the patient would recover from the  paralysis.       5. Pneumothorax:  Puncturing of a lung is a possibility, every time a needle is introduced in  the area of the chest or upper back.  Pneumothorax refers to free air around the  collapsed lung(s), inside of the thoracic cavity (chest cavity).  Another two possible  complications related to a similar event would include: Hemothorax and Chylothorax.   These are variations of the Pneumothorax, where instead of air around the collapsed  lung(s), you may have blood or chyle, respectively.       6. Spinal headaches: They may occur with any procedures in the area of the spine.        7. Persistent CSF (Cerebro-Spinal Fluid) leakage: This is a rare problem, but may occur  with prolonged intrathecal or epidural catheters either due to the formation of a fistulous  track or a dural tear.       8. Nerve damage: By working so close to the spinal cord, there is always a possibility of  nerve damage, which could be as serious as a permanent spinal cord injury with  paralysis.       9. Death:  Although rare, severe deadly allergic reactions known as "Anaphylactic  reaction" can occur to any of the medications used.      10. Worsening of the symptoms:  We can always make thing worse.  What are the chances of something like this happening? Chances of any of this occuring are extremely low.  By statistics, you have more of a chance of getting killed in a motor vehicle accident: while driving to the hospital than any of the above occurring .  Nevertheless, you should be aware that they are possibilities.  In general, it is similar to taking a shower.  Everybody knows that you can slip, hit your head and get killed.  Does that mean that you should not shower again?  Nevertheless always keep in mind that statistics do not mean anything if you happen to be on the wrong side of them.  Even if a procedure has a 1 (one) in a 1,000,000 (million) chance of going wrong, it you happen to be that one..Also, keep in mind that by statistics, you have more of a chance of having something go wrong when taking medications.  Who should not have this procedure? If you are on a blood thinning medication (e.g. Coumadin, Plavix, see list of "Blood Thinners"), or if you have an active infection going on, you should not have the procedure.  If you are taking any blood thinners, please inform your physician.  How should I prepare for this procedure? Do not eat or drink anything at least six hours prior to the procedure. Bring a driver with you .  It cannot be a taxi. Come accompanied by an adult that can drive you  back, and that is strong enough to help you if your legs get weak or numb from the local anesthetic. Take all of your medicines the morning of the procedure with just enough water to swallow them. If you have diabetes, make sure that you are scheduled to have your procedure done first thing in the morning, whenever possible. If you have diabetes, take only half of your insulin dose and notify our nurse that you have done so as soon as you arrive at the clinic. If you are diabetic, but only take blood sugar pills (oral hypoglycemic), then do not take them on the morning of your procedure.  You may take them after you have had the procedure. Do not take aspirin or any aspirin-containing medications, at least eleven (11) days prior to the procedure.  They may prolong bleeding.  Wear loose fitting clothing that may be easy to take off and that you would not mind if it got stained with Betadine or blood. Do not wear any jewelry or perfume Remove any nail coloring.  It will interfere with some of our monitoring equipment.  NOTE: Remember that this is not meant to be interpreted as a complete list of all possible complications.  Unforeseen problems may occur.  BLOOD THINNERS The following drugs contain aspirin or other products, which can cause increased bleeding during surgery and should not be taken for 2 weeks prior to and 1 week after surgery.  If you should need take something for relief of minor pain, you may take acetaminophen which is found in Tylenol,m Datril, Anacin-3 and Panadol. It is not blood thinner. The products listed below are.  Do not take any of the products listed below in addition to any listed on your instruction sheet.  A.P.C or A.P.C with Codeine Codeine Phosphate Capsules #3 Ibuprofen Ridaura  ABC compound Congesprin Imuran rimadil  Advil Cope Indocin Robaxisal  Alka-Seltzer Effervescent Pain Reliever and Antacid Coricidin or Coricidin-D  Indomethacin Rufen  Alka-Seltzer plus  Cold Medicine Cosprin Ketoprofen S-A-C Tablets  Anacin Analgesic Tablets or Capsules Coumadin Korlgesic Salflex  Anacin Extra Strength Analgesic tablets or capsules CP-2 Tablets Lanoril Salicylate  Anaprox Cuprimine Capsules Levenox Salocol  Anexsia-D Dalteparin Magan Salsalate  Anodynos Darvon compound Magnesium Salicylate Sine-off  Ansaid Dasin Capsules Magsal Sodium Salicylate  Anturane Depen Capsules Marnal Soma  APF Arthritis pain formula Dewitt's Pills Measurin Stanback  Argesic Dia-Gesic Meclofenamic Sulfinpyrazone  Arthritis Bayer Timed Release Aspirin Diclofenac Meclomen Sulindac  Arthritis pain formula Anacin Dicumarol Medipren Supac  Analgesic (Safety coated) Arthralgen Diffunasal Mefanamic Suprofen  Arthritis Strength Bufferin Dihydrocodeine Mepro Compound Suprol  Arthropan liquid Dopirydamole Methcarbomol with Aspirin Synalgos  ASA tablets/Enseals Disalcid Micrainin Tagament  Ascriptin Doan's Midol Talwin  Ascriptin A/D Dolene Mobidin Tanderil  Ascriptin Extra Strength Dolobid Moblgesic Ticlid  Ascriptin with Codeine Doloprin or Doloprin with Codeine Momentum Tolectin  Asperbuf Duoprin Mono-gesic Trendar  Aspergum Duradyne Motrin or Motrin IB Triminicin  Aspirin plain, buffered or enteric coated Durasal Myochrisine Trigesic  Aspirin Suppositories Easprin Nalfon Trillsate  Aspirin with Codeine Ecotrin Regular or Extra Strength Naprosyn Uracel  Atromid-S Efficin Naproxen Ursinus  Auranofin Capsules Elmiron Neocylate Vanquish  Axotal Emagrin Norgesic Verin  Azathioprine Empirin or Empirin with Codeine Normiflo Vitamin E  Azolid Emprazil Nuprin Voltaren  Bayer Aspirin plain, buffered or children's or timed BC Tablets or powders Encaprin Orgaran Warfarin Sodium  Buff-a-Comp Enoxaparin Orudis Zorpin  Buff-a-Comp with Codeine Equegesic Os-Cal-Gesic   Buffaprin Excedrin plain, buffered or Extra Strength Oxalid   Bufferin Arthritis Strength Feldene Oxphenbutazone   Bufferin  plain or Extra Strength Feldene Capsules Oxycodone with Aspirin   Bufferin with Codeine Fenoprofen Fenoprofen Pabalate or Pabalate-SF   Buffets II Flogesic Panagesic   Buffinol plain or Extra Strength Florinal or Florinal with Codeine Panwarfarin   Buf-Tabs Flurbiprofen Penicillamine   Butalbital Compound Four-way cold tablets Penicillin   Butazolidin Fragmin Pepto-Bismol   Carbenicillin Geminisyn Percodan   Carna Arthritis Reliever Geopen Persantine   Carprofen Gold's salt Persistin   Chloramphenicol Goody's Phenylbutazone   Chloromycetin Haltrain Piroxlcam   Clmetidine heparin Plaquenil   Cllnoril Hyco-pap Ponstel   Clofibrate Hydroxy chloroquine Propoxyphen         Before stopping any of these medications, be sure to consult the physician who ordered them.  Some, such as Coumadin (Warfarin) are ordered to prevent or treat  serious conditions such as "deep thrombosis", "pumonary embolisms", and other heart problems.  The amount of time that you may need off of the medication may also vary with the medication and the reason for which you were taking it.  If you are taking any of these medications, please make sure you notify your pain physician before you undergo any procedures.

## 2017-04-17 ENCOUNTER — Telehealth: Payer: Self-pay | Admitting: *Deleted

## 2017-04-17 NOTE — Telephone Encounter (Signed)
Patient verbalizes no questions or concerns re; procedure on yesterday. 

## 2017-04-18 ENCOUNTER — Encounter: Payer: Self-pay | Admitting: Family Medicine

## 2017-04-18 ENCOUNTER — Ambulatory Visit (INDEPENDENT_AMBULATORY_CARE_PROVIDER_SITE_OTHER): Payer: BLUE CROSS/BLUE SHIELD | Admitting: Family Medicine

## 2017-04-18 VITALS — BP 128/90 | HR 65 | Temp 98.1°F | Wt 172.0 lb

## 2017-04-18 DIAGNOSIS — R0609 Other forms of dyspnea: Secondary | ICD-10-CM | POA: Diagnosis not present

## 2017-04-18 DIAGNOSIS — B351 Tinea unguium: Secondary | ICD-10-CM | POA: Diagnosis not present

## 2017-04-18 DIAGNOSIS — F419 Anxiety disorder, unspecified: Secondary | ICD-10-CM

## 2017-04-18 DIAGNOSIS — R1013 Epigastric pain: Secondary | ICD-10-CM

## 2017-04-18 DIAGNOSIS — F32A Depression, unspecified: Secondary | ICD-10-CM

## 2017-04-18 DIAGNOSIS — E663 Overweight: Secondary | ICD-10-CM

## 2017-04-18 DIAGNOSIS — F329 Major depressive disorder, single episode, unspecified: Secondary | ICD-10-CM | POA: Diagnosis not present

## 2017-04-18 DIAGNOSIS — R0600 Dyspnea, unspecified: Secondary | ICD-10-CM

## 2017-04-18 DIAGNOSIS — E785 Hyperlipidemia, unspecified: Secondary | ICD-10-CM | POA: Diagnosis not present

## 2017-04-18 DIAGNOSIS — R0602 Shortness of breath: Secondary | ICD-10-CM

## 2017-04-18 DIAGNOSIS — G8929 Other chronic pain: Secondary | ICD-10-CM | POA: Diagnosis not present

## 2017-04-18 DIAGNOSIS — M25561 Pain in right knee: Secondary | ICD-10-CM | POA: Diagnosis not present

## 2017-04-18 NOTE — Assessment & Plan Note (Signed)
Continues to have issues with this. Cardiac evaluation unrevealing. We will obtain PFTs given her prior history of smoking. We'll also get her set up for lung cancer screening CT scan.

## 2017-04-18 NOTE — Patient Instructions (Addendum)
Nice to see you. I would advise continuing to work on diet and exercise. We will get the lung function testing and then determine appropriateness for weight loss medication. We will get you to see podiatry. Please continue to follow with pain management and your psychiatrist.

## 2017-04-18 NOTE — Assessment & Plan Note (Signed)
Chronic issue. Has improved with injections through pain management. She'll continue to follow with them. Bilateral heels mildly tender. Continue to follow with pain management.

## 2017-04-18 NOTE — Assessment & Plan Note (Signed)
Seems to be relatively stable. She'll continue to see psychiatry and her therapist.

## 2017-04-18 NOTE — Assessment & Plan Note (Signed)
Continues to have issues with this intermittently. Does seem to respond to PPI and Carafate. He had EGD last year with findings consistent with duodenitis and gastritis. Reports she has GI follow-up in the next month and a half. Encouraged her to keep this. If worsens she'll be evaluated.

## 2017-04-18 NOTE — Progress Notes (Signed)
Nicole Rumps, MD Phone: [phone]  Alvia Jablonski is a 56 y.o. female who presents today for follow-up.  Anxiety/depression: Currently on trintellix. Also taking trazodone at night. They increased the dose as she was having some confusion and her mind was going blank. She gets edgy and cries at times. Sees her therapist and psychiatrist. No SI.  Hyperlipidemia: taking simvastatin. No chest pain. Has chronic abdominal pain. Intermittently has issues with this. Currently taking AcipHex and Carafate. Has follow-up with GI soon. No myalgias.  She reports continued intermittent shortness of breath on exertion. She had cardiac evaluation that did not reveal a specific cause. She does wheeze seldomly. She does not cough. She did smoke cigarettes for about 35 years one to 2 packs a day. Currently vaping though not using any nicotine.  She reports thickened toenails on her bilateral second toes. They have fallen off before. She wonders if she should see a podiatrist.  She reports she has gotten some right knee injections. They have helped with her knee pain. She needed sedation for this. She notes occasional pain in her left heel with a burning sensation. Hurts most when she gets up in the morning. Sees pain management for her chronic pain issues.  PMH: Former smoker   ROS see history of present illness  Objective  Physical Exam Vitals:   04/18/17 1010 04/18/17 1038  BP: (!) 148/84 128/90  Pulse: 65   Temp: 98.1 F (36.7 C)     BP Readings from Last 3 Encounters:  04/18/17 128/90  04/16/17 139/73  03/28/17 128/73   Wt Readings from Last 3 Encounters:  04/18/17 172 lb (78 kg)  04/16/17 170 lb (77.1 kg)  03/28/17 172 lb (78 kg)    Physical Exam  Constitutional: No distress.  Cardiovascular: Normal rate, regular rhythm and normal heart sounds.   Pulmonary/Chest: Effort normal and breath sounds normal.  Musculoskeletal: She exhibits no edema.  Bilateral knees with no  swelling, warmth, erythema, or tenderness, no ligament laxity, negative McMurray's, bilateral heels with mild tenderness on palpation, no swelling, no focal tenderness, no erythema  Neurological: She is alert. Gait normal.  5/5 strength bilateral quads, hamstrings, plantar flexion, and dorsiflexion, sensation to light touch intact in bilateral lower extremities  Skin: She is not diaphoretic.     Assessment/Plan: Please see individual problem list.  Chronic abdominal pain Continues to have issues with this intermittently. Does seem to respond to PPI and Carafate. He had EGD last year with findings consistent with duodenitis and gastritis. Reports she has GI follow-up in the next month and a half. Encouraged her to keep this. If worsens she'll be evaluated.  Anxiety and depression Seems to be relatively stable. She'll continue to see psychiatry and her therapist.  Hyperlipidemia Tolerating simvastatin. We'll continue this.  Overweight (BMI 25.0-29.9) Congratulated on weight loss. She's now on the overweight range. She feels as though she has stalled. Discussed that she needs to continue to work on diet and exercise. We will need to figure out what is going on with her dyspnea prior to considering weight loss medication.  Shortness of breath Continues to have issues with this. Cardiac evaluation unrevealing. We will obtain PFTs given her prior history of smoking. We'll also get her set up for lung cancer screening CT scan.  Chronic knee pain (Right) Chronic issue. Has improved with injections through pain management. She'll continue to follow with them. Bilateral heels mildly tender. Continue to follow with pain management.   Orders Placed This Encounter  Procedures  . Ambulatory referral to Podiatry    Referral Priority:   Routine    Referral Type:   Consultation    Referral Reason:   Specialty Services Required    Requested Specialty:   Podiatry    Number of Visits Requested:   1    . Pulmonary function test    Standing Status:   Future    Standing Expiration Date:   04/18/2018    Order Specific Question:   Where should this test be performed?    Answer:   [facility]    Order Specific Question:   Full PFT: includes the following: basic spirometry, spirometry pre & post bronchodilator, diffusion capacity (DLCO), lung volumes    Answer:   Full PFT    Order Specific Question:   Methacholine challenge    Answer:   Yes    Meds ordered this encounter  Medications  . glucosamine-chondroitin 500-400 MG tablet    Sig: Take 1 tablet by mouth 3 (three) times daily.  . magnesium 30 MG tablet    Sig: Take 30 mg by mouth 2 (two) times daily.  . Calcium Carbonate-Vit D-Min (CALCIUM 1200 PO)    Sig: Take by mouth.   Patient has been working on getting disability. She was denied initially. She asked for my opinion on what her status could be. I discussed given what I know about her that she could likely do a job where she sits and has the opportunity to get up and move around if needed. Advised that if they needed anything from [place] for disability paperwork for recommendations she can have them send paperwork.  Nicole Rumps, MD [place]

## 2017-04-18 NOTE — Assessment & Plan Note (Signed)
Tolerating simvastatin. We'll continue this.

## 2017-04-18 NOTE — Assessment & Plan Note (Signed)
Congratulated on weight loss. She's now on the overweight range. She feels as though she has stalled. Discussed that she needs to continue to work on diet and exercise. We will need to figure out what is going on with her dyspnea prior to considering weight loss medication.

## 2017-04-19 ENCOUNTER — Telehealth: Payer: Self-pay | Admitting: *Deleted

## 2017-04-19 DIAGNOSIS — Z87891 Personal history of nicotine dependence: Secondary | ICD-10-CM

## 2017-04-19 NOTE — Telephone Encounter (Signed)
Received referral for initial lung cancer screening scan. Contacted patient and obtained smoking history,(former, quit 07/2014) as well as answering questions related to screening process. Patient denies signs of lung cancer such as weight loss or hemoptysis. Patient denies comorbidity that would prevent curative treatment if lung cancer were found. Patient is scheduled for shared decision making visit and CT scan on 05/01/17.

## 2017-04-20 ENCOUNTER — Telehealth: Payer: Self-pay | Admitting: *Deleted

## 2017-04-20 NOTE — Telephone Encounter (Signed)
Patient notified

## 2017-04-20 NOTE — Telephone Encounter (Signed)
I would suggest holding the supplements and seeing if her symptoms improve. If they do not we should reevaluate. Thanks.

## 2017-04-20 NOTE — Telephone Encounter (Signed)
She could try holding the magnesium first, then the calcium if not improving.

## 2017-04-20 NOTE — Telephone Encounter (Signed)
Pt called back and wanted to know if he thinks that there is one that she should stop more than the others. Pt wants to be able to tell which one of them is giving her the muscle cramps/spasms. Please advise, thank you!

## 2017-04-20 NOTE — Telephone Encounter (Signed)
Please advise 

## 2017-04-20 NOTE — Telephone Encounter (Signed)
Patient stated that she started taking glucosamine,magnesium and calcium. Pt was asked at appt on 06/14 if she has muscle cramps, however after returning home she realized that she has muscle spasms. She was advised by another physician that the combination of these medications can cause muscle spasms.  Pt contact  [phone]

## 2017-04-20 NOTE — Telephone Encounter (Signed)
Notified patient's husband

## 2017-04-23 ENCOUNTER — Telehealth: Payer: Self-pay

## 2017-04-23 NOTE — Telephone Encounter (Signed)
Patient informed may have up to 5 Hyalgan injections in the knee.

## 2017-04-23 NOTE — Telephone Encounter (Signed)
Patient wants to know how many knee injections does she have to have? Patient is scheduled for her 4th knee injection on June 27

## 2017-04-30 ENCOUNTER — Ambulatory Visit: Payer: BLUE CROSS/BLUE SHIELD | Admitting: Pain Medicine

## 2017-04-30 ENCOUNTER — Encounter: Payer: Self-pay | Admitting: Oncology

## 2017-05-01 ENCOUNTER — Ambulatory Visit
Admission: RE | Admit: 2017-05-01 | Discharge: 2017-05-01 | Disposition: A | Discharge status: Home / Self Care | Payer: BLUE CROSS/BLUE SHIELD | Source: Ambulatory Visit | Attending: Oncology | Admitting: Oncology

## 2017-05-01 ENCOUNTER — Inpatient Hospital Stay: Payer: BLUE CROSS/BLUE SHIELD | Attending: Oncology | Admitting: Oncology

## 2017-05-01 DIAGNOSIS — Z122 Encounter for screening for malignant neoplasm of respiratory organs: Secondary | ICD-10-CM | POA: Diagnosis not present

## 2017-05-01 DIAGNOSIS — M47814 Spondylosis without myelopathy or radiculopathy, thoracic region: Secondary | ICD-10-CM | POA: Insufficient documentation

## 2017-05-01 DIAGNOSIS — Z87891 Personal history of nicotine dependence: Secondary | ICD-10-CM | POA: Insufficient documentation

## 2017-05-01 DIAGNOSIS — K760 Fatty (change of) liver, not elsewhere classified: Secondary | ICD-10-CM | POA: Diagnosis not present

## 2017-05-01 DIAGNOSIS — R911 Solitary pulmonary nodule: Secondary | ICD-10-CM | POA: Insufficient documentation

## 2017-05-01 NOTE — Progress Notes (Signed)
In accordance with CMS guidelines, patient has met eligibility criteria including age, absence of signs or symptoms of lung cancer.  Social History  Substance Use Topics  . Smoking status: Former Smoker    Packs/day: 1.00    Years: 35.00    Start date: 03/06/1974    Quit date: 07/08/2015  . Smokeless tobacco: Never Used     Comment: vapor cigarettes, no nicotene  . Alcohol use No     A shared decision-making session was conducted prior to the performance of CT scan. This includes one or more decision aids, includes benefits and harms of screening, follow-up diagnostic testing, over-diagnosis, false positive rate, and total radiation exposure.  Counseling on the importance of adherence to annual lung cancer LDCT screening, impact of co-morbidities, and ability or willingness to undergo diagnosis and treatment is imperative for compliance of the program.  Counseling on the importance of continued smoking cessation for former smokers; the importance of smoking cessation for current smokers, and information about tobacco cessation interventions have been given to patient including [place] and [address] quit [place] programs.  Written order for lung cancer screening with LDCT has been given to the patient and any and all questions have been answered to the best of my abilities.   Yearly follow up will be coordinated by Burgess Estelle, Thoracic Navigator.  Jacquelin Hawking  NP

## 2017-05-02 ENCOUNTER — Ambulatory Visit: Payer: BLUE CROSS/BLUE SHIELD | Attending: Pain Medicine | Admitting: Pain Medicine

## 2017-05-02 ENCOUNTER — Encounter: Payer: Self-pay | Admitting: Pain Medicine

## 2017-05-02 VITALS — BP 119/65 | HR 64 | Temp 97.0°F | Resp 14 | Ht 64.0 in | Wt 170.0 lb

## 2017-05-02 DIAGNOSIS — M1711 Unilateral primary osteoarthritis, right knee: Secondary | ICD-10-CM

## 2017-05-02 DIAGNOSIS — Z888 Allergy status to other drugs, medicaments and biological substances status: Secondary | ICD-10-CM | POA: Diagnosis not present

## 2017-05-02 DIAGNOSIS — M542 Cervicalgia: Secondary | ICD-10-CM | POA: Diagnosis not present

## 2017-05-02 DIAGNOSIS — Z9049 Acquired absence of other specified parts of digestive tract: Secondary | ICD-10-CM | POA: Diagnosis not present

## 2017-05-02 DIAGNOSIS — M545 Low back pain: Secondary | ICD-10-CM | POA: Diagnosis not present

## 2017-05-02 DIAGNOSIS — G8929 Other chronic pain: Secondary | ICD-10-CM | POA: Diagnosis not present

## 2017-05-02 DIAGNOSIS — M25561 Pain in right knee: Secondary | ICD-10-CM | POA: Insufficient documentation

## 2017-05-02 DIAGNOSIS — M25551 Pain in right hip: Secondary | ICD-10-CM | POA: Insufficient documentation

## 2017-05-02 MED ORDER — ROPIVACAINE HCL 2 MG/ML IJ SOLN
2.0000 mL | Freq: Once | INTRAMUSCULAR | Status: AC
  Administered 2017-05-02: 2 mL via INTRA_ARTICULAR

## 2017-05-02 MED ORDER — FENTANYL CITRATE (PF) 100 MCG/2ML IJ SOLN
25.0000 ug | INTRAMUSCULAR | Status: DC | PRN
  Administered 2017-05-02: 50 ug via INTRAVENOUS

## 2017-05-02 MED ORDER — LIDOCAINE HCL (PF) 1 % IJ SOLN
INTRAMUSCULAR | Status: AC
  Filled 2017-05-02: qty 5

## 2017-05-02 MED ORDER — ROPIVACAINE HCL 2 MG/ML IJ SOLN
INTRAMUSCULAR | Status: AC
  Filled 2017-05-02: qty 10

## 2017-05-02 MED ORDER — SODIUM HYALURONATE (VISCOSUP) 20 MG/2ML IX SOSY
2.0000 mL | PREFILLED_SYRINGE | Freq: Once | INTRA_ARTICULAR | Status: AC
  Administered 2017-05-02: 2 mL via INTRA_ARTICULAR
  Filled 2017-05-02: qty 2

## 2017-05-02 MED ORDER — MIDAZOLAM HCL 5 MG/5ML IJ SOLN
1.0000 mg | INTRAMUSCULAR | Status: DC | PRN
  Administered 2017-05-02: 2 mg via INTRAVENOUS

## 2017-05-02 MED ORDER — MIDAZOLAM HCL 5 MG/5ML IJ SOLN
INTRAMUSCULAR | Status: AC
  Filled 2017-05-02: qty 5

## 2017-05-02 MED ORDER — FENTANYL CITRATE (PF) 100 MCG/2ML IJ SOLN
INTRAMUSCULAR | Status: AC
  Filled 2017-05-02: qty 2

## 2017-05-02 MED ORDER — LACTATED RINGERS IV SOLN
1000.0000 mL | Freq: Once | INTRAVENOUS | Status: AC
  Administered 2017-05-02: 1000 mL via INTRAVENOUS

## 2017-05-02 MED ORDER — LIDOCAINE HCL (PF) 1 % IJ SOLN
5.0000 mL | Freq: Once | INTRAMUSCULAR | Status: AC
  Administered 2017-05-02: 5 mL

## 2017-05-02 NOTE — Patient Instructions (Addendum)
____________________________________________________________________________________________  Post-Procedure instructions Instructions:  Apply ice: Fill a plastic sandwich bag with crushed ice. Cover it with a small towel and apply to injection site. Apply for 15 minutes then remove x 15 minutes. Repeat sequence on day of procedure, until you go to bed. The purpose is to minimize swelling and discomfort after procedure.  Apply heat: Apply heat to procedure site starting the day following the procedure. The purpose is to treat any soreness and discomfort from the procedure.  Food intake: Start with clear liquids (like water) and advance to regular food, as tolerated.   Physical activities: Keep activities to a minimum for the first 8 hours after the procedure.   Driving: If you have received any sedation, you are not allowed to drive for 24 hours after your procedure.  Blood thinner: Restart your blood thinner 6 hours after your procedure. (Only for those taking blood thinners)  Insulin: As soon as you can eat, you may resume your normal dosing schedule. (Only for those taking insulin)  Infection prevention: Keep procedure site clean and dry.  Post-procedure Pain Diary: Extremely important that this be done correctly and accurately. Recorded information will be used to determine the next step in treatment.  Pain evaluated is that of treated area only. Do not include pain from an untreated area.  Complete every hour, on the hour, for the initial 8 hours. Set an alarm to help you do this part accurately.  Do not go to sleep and have it completed later. It will not be accurate.  Follow-up appointment: Keep your follow-up appointment after the procedure. Usually 2 weeks for most procedures. (6 weeks in the case of radiofrequency.) Bring you pain diary.  Expect:  From numbing medicine (AKA: Local Anesthetics): Numbness or decrease in pain.  Onset: Full effect within 15 minutes of  injected.  Duration: It will depend on the type of local anesthetic used. On the average, 1 to 8 hours.   From steroids: Decrease in swelling or inflammation. Once inflammation is improved, relief of the pain will follow.  Onset of benefits: Depends on the amount of swelling present. The more swelling, the longer it will take for the benefits to be seen. In some cases, up to 10 days.  Duration: Steroids will stay in the system x 2 weeks. Duration of benefits will depend on multiple posibilities including persistent irritating factors.  From procedure: Some discomfort is to be expected once the numbing medicine wears off. This should be minimal if ice and heat are applied as instructed. Call if:  You experience numbness and weakness that gets worse with time, as opposed to wearing off.  New onset bowel or bladder incontinence. (Spinal procedures only)  Emergency Numbers:  Durning business hours (Monday - Thursday, 8:00 AM - 4:00 PM) (Friday, 9:00 AM - 12:00 Noon): [phone]  After hours: [phone] ____________________________________________________________________________________________  ____________________________________________________________________________________________  Preparing for your procedure (without sedation) Instructions: . Oral Intake: Do not eat or drink anything for at least 3 hours prior to your procedure. . Transportation: Unless otherwise stated by your physician, you may drive yourself after the procedure. . Blood Pressure Medicine: Take your blood pressure medicine with a sip of water the morning of the procedure. . Blood thinners:  . Diabetics on insulin: Notify the staff so that you can be scheduled 1st case in the morning. If your diabetes requires high dose insulin, take only  of your normal insulin dose the morning of the procedure and notify the staff   that you have done so. . Preventing infections: Shower with an antibacterial soap the  morning of your procedure.  . Build-up your immune system: Take 1000 mg of Vitamin C with every meal (3 times a day) the day prior to your procedure. Marland Kitchen Antibiotics: Inform the staff if you have a condition or reason that requires you to take antibiotics before dental procedures. . Pregnancy: If you are pregnant, call and cancel the procedure. . Sickness: If you have a cold, fever, or any active infections, call and cancel the procedure. . Arrival: You must be in the facility at least 30 minutes prior to your scheduled procedure. . Children: Do not bring any children with you. . Dress appropriately: Bring dark clothing that you would not mind if they get stained. . Valuables: Do not bring any jewelry or valuables. Procedure appointments are reserved for interventional treatments only. Marland Kitchen No Prescription Refills. . No medication changes will be discussed during procedure appointments. . No disability issues will be discussed. ____________________________________________________________________________________________  Pain Management Discharge Instructions  General Discharge Instructions :  If you need to reach your doctor call: Monday-Friday 8:00 am - 4:00 pm at [phone] or toll free [phone].  After clinic hours [phone] to have operator reach doctor.  Bring all of your medication bottles to all your appointments in the pain clinic.  To cancel or reschedule your appointment with Pain Management please remember to call 24 hours in advance to avoid a fee.  Refer to the educational materials which you have been given on: General Risks, I had my Procedure. Discharge Instructions, Post Sedation.  Post Procedure Instructions:  The drugs you were given will stay in your system until tomorrow, so for the next 24 hours you should not drive, make any legal decisions or drink any alcoholic beverages.  You may eat anything you prefer, but it is better to start with liquids then soups  and crackers, and gradually work up to solid foods.  Please notify your doctor immediately if you have any unusual bleeding, trouble breathing or pain that is not related to your normal pain.  Depending on the type of procedure that was done, some parts of your body may feel week and/or numb.  This usually clears up by tonight or the next day.  Walk with the use of an assistive device or accompanied by an adult for the 24 hours.  You may use ice on the affected area for the first 24 hours.  Put ice in a Ziploc bag and cover with a towel and place against area 15 minutes on 15 minutes off.  You may switch to heat after 24 hours. Knee Injection A knee injection is a procedure to get medicine into your knee joint. Your health care provider puts a needle into the joint and injects medicine with an attached syringe. The injected medicine may relieve the pain, swelling, and stiffness of arthritis. The injected medicine may also help to lubricate and cushion your knee joint. You may need more than one injection. Tell a health care provider about:  Any allergies you have.  All medicines you are taking, including vitamins, herbs, eye drops, creams, and over-the-counter medicines.  Any problems you or family members have had with anesthetic medicines.  Any blood disorders you have.  Any surgeries you have had.  Any medical conditions you have. What are the risks? Generally, this is a safe procedure. However, problems may occur, including:  Infection.  Bleeding.  Worsening symptoms.  Damage to the  area around your knee.  Allergic reaction to any of the medicines.  Skin reactions from repeated injections.  What happens before the procedure?  Ask your health care provider about changing or stopping your regular medicines. This is especially important if you are taking diabetes medicines or blood thinners.  Plan to have someone take you home after the procedure. What happens during the  procedure?  You will sit or lie down in a position for your knee to be treated.  The skin over your kneecap will be cleaned with a germ-killing solution (antiseptic).  You will be given a medicine that numbs the area (local anesthetic). You may feel some stinging.  After your knee becomes numb, you will have a second injection. This is the medicine. This needle is carefully placed between your kneecap and your knee. The medicine is injected into the joint space.  At the end of the procedure, the needle will be removed.  A bandage (dressing) may be placed over the injection site. The procedure may vary among health care providers and hospitals. What happens after the procedure?  You may have to move your knee through its full range of motion. This helps to get all of the medicine into your joint space.  Your blood pressure, heart rate, breathing rate, and blood oxygen level will be monitored often until the medicines you were given have worn off.  You will be watched to make sure that you do not have a reaction to the injected medicine. This information is not intended to replace advice given to you by your health care provider. Make sure you discuss any questions you have with your health care provider. Document Released: 01/14/2007 Document Revised: 03/24/2016 Document Reviewed: 09/02/2014 Elsevier Interactive Patient Education  2018 [place]  What are the risk, side effects and possible complications? Generally speaking, most procedures are safe.  However, with any procedure there are risks, side effects, and the possibility of complications.  The risks and complications are dependent upon the sites that are lesioned, or the type of nerve block to be performed.  The closer the procedure is to the spine, the more serious the risks are.  Great care is taken when placing the radio frequency needles, block needles or lesioning probes, but sometimes  complications can occur. 1. Infection: Any time there is an injection through the skin, there is a risk of infection.  This is why sterile conditions are used for these blocks.  There are four possible types of infection. 1. Localized skin infection. 2. Central Nervous System Infection-This can be in the form of Meningitis, which can be deadly. 3. Epidural Infections-This can be in the form of an epidural abscess, which can cause pressure inside of the spine, causing compression of the spinal cord with subsequent paralysis. This would require an emergency surgery to decompress, and there are no guarantees that the patient would recover from the paralysis. 4. Discitis-This is an infection of the intervertebral discs.  It occurs in about 1% of discography procedures.  It is difficult to treat and it may lead to surgery.        2. Pain: the needles have to go through skin and soft tissues, will cause soreness.       3. Damage to internal structures:  The nerves to be lesioned may be near blood vessels or    other nerves which can be potentially damaged.       4. Bleeding: Bleeding is more  common if the patient is taking blood thinners such as  aspirin, Coumadin, Ticiid, Plavix, etc., or if he/she have some genetic predisposition  such as hemophilia. Bleeding into the spinal canal can cause compression of the spinal  cord with subsequent paralysis.  This would require an emergency surgery to  decompress and there are no guarantees that the patient would recover from the  paralysis.       5. Pneumothorax:  Puncturing of a lung is a possibility, every time a needle is introduced in  the area of the chest or upper back.  Pneumothorax refers to free air around the  collapsed lung(s), inside of the thoracic cavity (chest cavity).  Another two possible  complications related to a similar event would include: Hemothorax and Chylothorax.   These are variations of the Pneumothorax, where instead of air around the  collapsed  lung(s), you may have blood or chyle, respectively.       6. Spinal headaches: They may occur with any procedures in the area of the spine.       7. Persistent CSF (Cerebro-Spinal Fluid) leakage: This is a rare problem, but may occur  with prolonged intrathecal or epidural catheters either due to the formation of a fistulous  track or a dural tear.       8. Nerve damage: By working so close to the spinal cord, there is always a possibility of  nerve damage, which could be as serious as a permanent spinal cord injury with  paralysis.       9. Death:  Although rare, severe deadly allergic reactions known as "Anaphylactic  reaction" can occur to any of the medications used.      10. Worsening of the symptoms:  We can always make thing worse.  What are the chances of something like this happening? Chances of any of this occuring are extremely low.  By statistics, you have more of a chance of getting killed in a motor vehicle accident: while driving to the hospital than any of the above occurring .  Nevertheless, you should be aware that they are possibilities.  In general, it is similar to taking a shower.  Everybody knows that you can slip, hit your head and get killed.  Does that mean that you should not shower again?  Nevertheless always keep in mind that statistics do not mean anything if you happen to be on the wrong side of them.  Even if a procedure has a 1 (one) in a 1,000,000 (million) chance of going wrong, it you happen to be that one..Also, keep in mind that by statistics, you have more of a chance of having something go wrong when taking medications.  Who should not have this procedure? If you are on a blood thinning medication (e.g. Coumadin, Plavix, see list of "Blood Thinners"), or if you have an active infection going on, you should not have the procedure.  If you are taking any blood thinners, please inform your physician.  How should I prepare for this procedure?  Do not eat  or drink anything at least six hours prior to the procedure.  Bring a driver with you .  It cannot be a taxi.  Come accompanied by an adult that can drive you back, and that is strong enough to help you if your legs get weak or numb from the local anesthetic.  Take all of your medicines the morning of the procedure with just enough water to swallow them.  If  you have diabetes, make sure that you are scheduled to have your procedure done first thing in the morning, whenever possible.  If you have diabetes, take only half of your insulin dose and notify our nurse that you have done so as soon as you arrive at the clinic.  If you are diabetic, but only take blood sugar pills (oral hypoglycemic), then do not take them on the morning of your procedure.  You may take them after you have had the procedure.  Do not take aspirin or any aspirin-containing medications, at least eleven (11) days prior to the procedure.  They may prolong bleeding.  Wear loose fitting clothing that may be easy to take off and that you would not mind if it got stained with Betadine or blood.  Do not wear any jewelry or perfume  Remove any nail coloring.  It will interfere with some of our monitoring equipment.  NOTE: Remember that this is not meant to be interpreted as a complete list of all possible complications.  Unforeseen problems may occur.  BLOOD THINNERS The following drugs contain aspirin or other products, which can cause increased bleeding during surgery and should not be taken for 2 weeks prior to and 1 week after surgery.  If you should need take something for relief of minor pain, you may take acetaminophen which is found in Tylenol,m Datril, Anacin-3 and Panadol. It is not blood thinner. The products listed below are.  Do not take any of the products listed below in addition to any listed on your instruction sheet.  A.P.C or A.P.C with Codeine Codeine Phosphate Capsules #3 Ibuprofen Ridaura  ABC compound  Congesprin Imuran rimadil  Advil Cope Indocin Robaxisal  Alka-Seltzer Effervescent Pain Reliever and Antacid Coricidin or Coricidin-D  Indomethacin Rufen  Alka-Seltzer plus Cold Medicine Cosprin Ketoprofen S-A-C Tablets  Anacin Analgesic Tablets or Capsules Coumadin Korlgesic Salflex  Anacin Extra Strength Analgesic tablets or capsules CP-2 Tablets Lanoril Salicylate  Anaprox Cuprimine Capsules Levenox Salocol  Anexsia-D Dalteparin Magan Salsalate  Anodynos Darvon compound Magnesium Salicylate Sine-off  Ansaid Dasin Capsules Magsal Sodium Salicylate  Anturane Depen Capsules Marnal Soma  APF Arthritis pain formula Dewitt's Pills Measurin Stanback  Argesic Dia-Gesic Meclofenamic Sulfinpyrazone  Arthritis Bayer Timed Release Aspirin Diclofenac Meclomen Sulindac  Arthritis pain formula Anacin Dicumarol Medipren Supac  Analgesic (Safety coated) Arthralgen Diffunasal Mefanamic Suprofen  Arthritis Strength Bufferin Dihydrocodeine Mepro Compound Suprol  Arthropan liquid Dopirydamole Methcarbomol with Aspirin Synalgos  ASA tablets/Enseals Disalcid Micrainin Tagament  Ascriptin Doan's Midol Talwin  Ascriptin A/D Dolene Mobidin Tanderil  Ascriptin Extra Strength Dolobid Moblgesic Ticlid  Ascriptin with Codeine Doloprin or Doloprin with Codeine Momentum Tolectin  Asperbuf Duoprin Mono-gesic Trendar  Aspergum Duradyne Motrin or Motrin IB Triminicin  Aspirin plain, buffered or enteric coated Durasal Myochrisine Trigesic  Aspirin Suppositories Easprin Nalfon Trillsate  Aspirin with Codeine Ecotrin Regular or Extra Strength Naprosyn Uracel  Atromid-S Efficin Naproxen Ursinus  Auranofin Capsules Elmiron Neocylate Vanquish  Axotal Emagrin Norgesic Verin  Azathioprine Empirin or Empirin with Codeine Normiflo Vitamin E  Azolid Emprazil Nuprin Voltaren  Bayer Aspirin plain, buffered or children's or timed BC Tablets or powders Encaprin Orgaran Warfarin Sodium  Buff-a-Comp Enoxaparin Orudis Zorpin   Buff-a-Comp with Codeine Equegesic Os-Cal-Gesic   Buffaprin Excedrin plain, buffered or Extra Strength Oxalid   Bufferin Arthritis Strength Feldene Oxphenbutazone   Bufferin plain or Extra Strength Feldene Capsules Oxycodone with Aspirin   Bufferin with Codeine Fenoprofen Fenoprofen Pabalate or Pabalate-SF   Buffets II Flogesic  Panagesic   Buffinol plain or Extra Strength Florinal or Florinal with Codeine Panwarfarin   Buf-Tabs Flurbiprofen Penicillamine   Butalbital Compound Four-way cold tablets Penicillin   Butazolidin Fragmin Pepto-Bismol   Carbenicillin Geminisyn Percodan   Carna Arthritis Reliever Geopen Persantine   Carprofen Gold's salt Persistin   Chloramphenicol Goody's Phenylbutazone   Chloromycetin Haltrain Piroxlcam   Clmetidine heparin Plaquenil   Cllnoril Hyco-pap Ponstel   Clofibrate Hydroxy chloroquine Propoxyphen         Before stopping any of these medications, be sure to consult the physician who ordered them.  Some, such as Coumadin (Warfarin) are ordered to prevent or treat serious conditions such as "deep thrombosis", "pumonary embolisms", and other heart problems.  The amount of time that you may need off of the medication may also vary with the medication and the reason for which you were taking it.  If you are taking any of these medications, please make sure you notify your pain physician before you undergo any procedures.

## 2017-05-02 NOTE — Progress Notes (Signed)
Safety precautions to be maintained throughout the outpatient stay will include: orient to surroundings, keep bed in low position, maintain call bell within reach at all times, provide assistance with transfer out of bed and ambulation.  

## 2017-05-02 NOTE — Progress Notes (Signed)
Patient's Name: Nicole Walter  MRN: [id number]  Referring Provider: Leone Haven, MD  DOB: 08-24-1961  PCP: Leone Haven, MD  DOS: 05/02/2017  Note by: Kathlen Brunswick. Dossie Arbour, MD  Service setting: Ambulatory outpatient  Location: [facility] (AMB) Pain Management Facility  Visit type: Procedure  Specialty: Interventional Pain Management  Patient type: Established   Primary Reason for Visit: Interventional Pain Management Treatment. CC: Neck Pain; Hip Pain (right); Back Pain (lower); and Knee Pain (right)  Procedure:  Anesthesia, Analgesia, Anxiolysis:  Type: Therapeutic Intra-Articular Hyalgan Knee Injection #4 Region: Lateral infrapatellar Knee Region Level: Knee Joint Laterality: Right knee  Type: Local Anesthesia with Moderate (Conscious) Sedation Local Anesthetic: Lidocaine 1% Route: Intravenous (IV) IV Access: Secured Sedation: Meaningful verbal contact was maintained at all times during the procedure  Indication(s): Analgesia and Anxiety  Indications: 1. Osteoarthritis of knee (Right)   2. Chronic knee pain (Right)   3. Chronic knee arthropathy (Right)    Pain Score: Pre-procedure: 0-No pain/10 Post-procedure: 0-No pain/10  Pre-op Assessment:  Previous date of service: 04/16/17 Service provided: Procedure (right knee intraarticular knee injection Hyalgan) Nicole Walter is a 56 y.o. (year old), female patient, seen today for interventional treatment. She  has a past surgical history that includes Cholecystectomy; Tubal ligation; Ankle surgery; Nasal sinus surgery; Tonsillectomy; Colonoscopy; Esophagogastroduodenoscopy (egd) with propofol (N/A, 02/22/2016); Colonoscopy with propofol (N/A, 02/22/2016); and Breast biopsy (Left, 1997). Her primarily concern today is the Neck Pain; Hip Pain (right); Back Pain (lower); and Knee Pain (right)  Initial Vital Signs: There were no vitals taken for this visit. BMI: 29.18 kg/m  Risk Assessment: Allergies: Reviewed. She is allergic to other  and food.  Allergy Precautions: None required Coagulopathies: Reviewed. None identified.  Blood-thinner therapy: None at this time Active Infection(s): Reviewed. None identified. Nicole Walter is afebrile  Site Confirmation: Nicole Walter was asked to confirm the procedure and laterality before marking the site Procedure checklist: Completed Consent: Before the procedure and under the influence of no sedative(s), amnesic(s), or anxiolytics, the patient was informed of the treatment options, risks and possible complications. To fulfill our ethical and legal obligations, as recommended by the American Medical Association's Code of Ethics, I have informed the patient of my clinical impression; the nature and purpose of the treatment or procedure; the risks, benefits, and possible complications of the intervention; the alternatives, including doing nothing; the risk(s) and benefit(s) of the alternative treatment(s) or procedure(s); and the risk(s) and benefit(s) of doing nothing. The patient was provided information about the general risks and possible complications associated with the procedure. These may include, but are not limited to: failure to achieve desired goals, infection, bleeding, organ or nerve damage, allergic reactions, paralysis, and death. In addition, the patient was informed of those risks and complications associated to the procedure, such as failure to decrease pain; infection; bleeding; organ or nerve damage with subsequent damage to sensory, motor, and/or autonomic systems, resulting in permanent pain, numbness, and/or weakness of one or several areas of the body; allergic reactions; (i.e.: anaphylactic reaction); and/or death. Furthermore, the patient was informed of those risks and complications associated with the medications. These include, but are not limited to: allergic reactions (i.e.: anaphylactic or anaphylactoid reaction(s)); adrenal axis suppression; blood sugar elevation that in  diabetics may result in ketoacidosis or comma; water retention that in patients with history of congestive heart failure may result in shortness of breath, pulmonary edema, and decompensation with resultant heart failure; weight gain; swelling or edema; medication-induced neural toxicity;  particulate matter embolism and blood vessel occlusion with resultant organ, and/or nervous system infarction; and/or aseptic necrosis of one or more joints. Finally, the patient was informed that Medicine is not an exact science; therefore, there is also the possibility of unforeseen or unpredictable risks and/or possible complications that may result in a catastrophic outcome. The patient indicated having understood very clearly. We have given the patient no guarantees and we have made no promises. Enough time was given to the patient to ask questions, all of which were answered to the patient's satisfaction. Nicole Walter has indicated that she wanted to continue with the procedure. Attestation: I, the ordering provider, attest that I have discussed with the patient the benefits, risks, side-effects, alternatives, likelihood of achieving goals, and potential problems during recovery for the procedure that I have provided informed consent. Date: 05/02/2017; Time: 7:39 AM  Pre-Procedure Preparation:  Monitoring: As per clinic protocol. Respiration, ETCO2, SpO2, BP, heart rate and rhythm monitor placed and checked for adequate function Safety Precautions: Patient was assessed for positional comfort and pressure points before starting the procedure. Time-out: I initiated and conducted the "Time-out" before starting the procedure, as per protocol. The patient was asked to participate by confirming the accuracy of the "Time Out" information. Verification of the correct person, site, and procedure were performed and confirmed by me, the nursing staff, and the patient. "Time-out" conducted as per Joint Commission's Universal Protocol  (UP.01.01.01). "Time-out" Date & Time: 05/02/2017; 0839 hrs.  Description of Procedure Process:   Position: Sitting Target Area: Knee Joint Approach: Just above the Lateral tibial plateau, lateral to the infrapatellar tendon. Area Prepped: Entire knee area, from the mid-thigh to the mid-shin. Prepping solution: ChloraPrep (2% chlorhexidine gluconate and 70% isopropyl alcohol) Safety Precautions: Aspiration looking for blood return was conducted prior to all injections. At no point did we inject any substances, as a needle was being advanced. No attempts were made at seeking any paresthesias. Safe injection practices and needle disposal techniques used. Medications properly checked for expiration dates. SDV (single dose vial) medications used. Description of the Procedure: Protocol guidelines were followed. The patient was placed in position over the fluoroscopy table. The target area was identified and the area prepped in the usual manner. Skin desensitized using vapocoolant spray. Skin & deeper tissues infiltrated with local anesthetic. Appropriate amount of time allowed to pass for local anesthetics to take effect. The procedure needles were then advanced to the target area. Proper needle placement secured. Negative aspiration confirmed. Solution injected in intermittent fashion, asking for systemic symptoms every 0.5cc of injectate. The needles were then removed and the area cleansed, making sure to leave some of the prepping solution back to take advantage of its long term bactericidal properties. Vitals:   05/02/17 0839 05/02/17 0843 05/02/17 0853 05/02/17 0903  BP: 112/74 111/71 117/60 139/62  Pulse:    61  Resp: 14 14 14 16   Temp:   97 F (36.1 C)   SpO2: 97% 95% 96% 95%  Weight:      Height:        Start Time: 0840 hrs. End Time: 0842 hrs. Materials:  Needle(s) Type: Regular needle Gauge: 25G Length: 1.5-in Medication(s): We administered lidocaine (PF), Sodium Hyaluronate,  ropivacaine (PF) 2 mg/mL (0.2%), lactated ringers, midazolam, and fentaNYL. Please see chart orders for dosing details.  Imaging Guidance:  Type of Imaging Technique: None used Indication(s): N/A Exposure Time: No patient exposure Contrast: None used. Fluoroscopic Guidance: N/A Ultrasound Guidance: N/A Interpretation: N/A  Antibiotic  Prophylaxis:  Indication(s): None identified Antibiotic given: None  Post-operative Assessment:  EBL: None Complications: No immediate post-treatment complications observed by team, or reported by patient. Note: The patient tolerated the entire procedure well. A repeat set of vitals were taken after the procedure and the patient was kept under observation following institutional policy, for this type of procedure. Post-procedural neurological assessment was performed, showing return to baseline, prior to discharge. The patient was provided with post-procedure discharge instructions, including a section on how to identify potential problems. Should any problems arise concerning this procedure, the patient was given instructions to immediately contact us, at any time, without hesitation. In any case, we plan to contact the patient by telephone for a follow-up status report regarding this interventional procedure. Comments:  No additional relevant information.  Plan of Care  Disposition: Discharge home  Discharge Date & Time: 05/02/2017; 0913 hrs.  Physician-requested Follow-up:  Return for NS procedure, (2 wks), by MD (Hyalgan No. 5).  Future Appointments Date Time Provider [place]  05/15/2017 9:15 AM Vevelyn Francois, NP [facility] None  05/16/2017 8:00 AM Milinda Pointer, MD [facility] None  05/24/2017 10:30 AM Gardiner Barefoot, DPM [facility]  07/19/2017 9:15 AM Caryl Bis Angela Adam, MD [facility] None   Medications ordered for procedure: Meds ordered this encounter  Medications  . lidocaine (PF) (XYLOCAINE) 1 % injection 5 mL  . Sodium  Hyaluronate SOSY 2 mL  . ropivacaine (PF) 2 mg/mL (0.2%) (NAROPIN) injection 2 mL  . lactated ringers infusion 1,000 mL  . midazolam (VERSED) 5 MG/5ML injection 1-2 mg    Make sure Flumazenil is available in the pyxis when using this medication. If oversedation occurs, administer 0.2 mg IV over 15 sec. If after 45 sec no response, administer 0.2 mg again over 1 min; may repeat at 1 min intervals; not to exceed 4 doses (1 mg)  . fentaNYL (SUBLIMAZE) injection 25-50 mcg    Make sure Narcan is available in the pyxis when using this medication. In the event of respiratory depression (RR< 8/min): Titrate NARCAN (naloxone) in increments of 0.1 to 0.2 mg IV at 2-3 minute intervals, until desired degree of reversal.   Medications administered: We administered lidocaine (PF), Sodium Hyaluronate, ropivacaine (PF) 2 mg/mL (0.2%), lactated ringers, midazolam, and fentaNYL.  See the medical record for exact dosing, route, and time of administration.  Lab-work, Procedure(s), & Referral(s) Ordered: Orders Placed This Encounter  Procedures  . KNEE INJECTION  . KNEE INJECTION  . Informed Consent Details: Transcribe to consent form and obtain patient signature  . Provider attestation of informed consent for procedure/surgical case  . Verify informed consent  . Discharge instructions  . Follow-up   Imaging Ordered: Results for orders placed in visit on 01/31/17  DG C-Arm 1-60 Min-No Report   Narrative Fluoroscopy was utilized by the requesting physician.  No radiographic  interpretation.    New Prescriptions   No medications on file   Primary Care Physician: Leone Haven, MD Location: [facility] Note by: Kathlen Brunswick. Dossie Arbour, M.D, DABA, DABAPM, DABPM, DABIPP, FIPP Date: 05/02/2017; Time: 9:07 AM  Disclaimer:  Medicine is not an Chief Strategy Officer. The only guarantee in medicine is that nothing is guaranteed. It is important to note that the decision to proceed with  this intervention was based on the information collected from the patient. The Data and conclusions were drawn from the patient's questionnaire, the interview, and the physical examination. Because the information was provided in large part by  the patient, it cannot be guaranteed that it has not been purposely or unconsciously manipulated. Every effort has been made to obtain as much relevant data as possible for this evaluation. It is important to note that the conclusions that lead to this procedure are derived in large part from the available data. Always take into account that the treatment will also be dependent on availability of resources and existing treatment guidelines, considered by other Pain Management Practitioners as being common knowledge and practice, at the time of the intervention. For Medico-Legal purposes, it is also important to point out that variation in procedural techniques and pharmacological choices are the acceptable norm. The indications, contraindications, technique, and results of the above procedure should only be interpreted and judged by a Board-Certified Interventional Pain Specialist with extensive familiarity and expertise in the same exact procedure and technique.  Instructions provided at this appointment: Patient Instructions   ____________________________________________________________________________________________  Post-Procedure instructions Instructions:  Apply ice: Fill a plastic sandwich bag with crushed ice. Cover it with a small towel and apply to injection site. Apply for 15 minutes then remove x 15 minutes. Repeat sequence on day of procedure, until you go to bed. The purpose is to minimize swelling and discomfort after procedure.  Apply heat: Apply heat to procedure site starting the day following the procedure. The purpose is to treat any soreness and discomfort from the procedure.  Food intake: Start with clear liquids (like water) and advance to  regular food, as tolerated.   Physical activities: Keep activities to a minimum for the first 8 hours after the procedure.   Driving: If you have received any sedation, you are not allowed to drive for 24 hours after your procedure.  Blood thinner: Restart your blood thinner 6 hours after your procedure. (Only for those taking blood thinners)  Insulin: As soon as you can eat, you may resume your normal dosing schedule. (Only for those taking insulin)  Infection prevention: Keep procedure site clean and dry.  Post-procedure Pain Diary: Extremely important that this be done correctly and accurately. Recorded information will be used to determine the next step in treatment.  Pain evaluated is that of treated area only. Do not include pain from an untreated area.  Complete every hour, on the hour, for the initial 8 hours. Set an alarm to help you do this part accurately.  Do not go to sleep and have it completed later. It will not be accurate.  Follow-up appointment: Keep your follow-up appointment after the procedure. Usually 2 weeks for most procedures. (6 weeks in the case of radiofrequency.) Bring you pain diary.  Expect:  From numbing medicine (AKA: Local Anesthetics): Numbness or decrease in pain.  Onset: Full effect within 15 minutes of injected.  Duration: It will depend on the type of local anesthetic used. On the average, 1 to 8 hours.   From steroids: Decrease in swelling or inflammation. Once inflammation is improved, relief of the pain will follow.  Onset of benefits: Depends on the amount of swelling present. The more swelling, the longer it will take for the benefits to be seen. In some cases, up to 10 days.  Duration: Steroids will stay in the system x 2 weeks. Duration of benefits will depend on multiple posibilities including persistent irritating factors.  From procedure: Some discomfort is to be expected once the numbing medicine wears off. This should be minimal if  ice and heat are applied as instructed. Call if:  You experience numbness and weakness  that gets worse with time, as opposed to wearing off.  New onset bowel or bladder incontinence. (Spinal procedures only)  Emergency Numbers:  Durning business hours (Monday - Thursday, 8:00 AM - 4:00 PM) (Friday, 9:00 AM - 12:00 Noon): [phone]  After hours: [phone] ____________________________________________________________________________________________  ____________________________________________________________________________________________  Preparing for your procedure (without sedation) Instructions: . Oral Intake: Do not eat or drink anything for at least 3 hours prior to your procedure. . Transportation: Unless otherwise stated by your physician, you may drive yourself after the procedure. . Blood Pressure Medicine: Take your blood pressure medicine with a sip of water the morning of the procedure. . Blood thinners:  . Diabetics on insulin: Notify the staff so that you can be scheduled 1st case in the morning. If your diabetes requires high dose insulin, take only  of your normal insulin dose the morning of the procedure and notify the staff that you have done so. . Preventing infections: Shower with an antibacterial soap the morning of your procedure.  . Build-up your immune system: Take 1000 mg of Vitamin C with every meal (3 times a day) the day prior to your procedure. Marland Kitchen Antibiotics: Inform the staff if you have a condition or reason that requires you to take antibiotics before dental procedures. . Pregnancy: If you are pregnant, call and cancel the procedure. . Sickness: If you have a cold, fever, or any active infections, call and cancel the procedure. . Arrival: You must be in the facility at least 30 minutes prior to your scheduled procedure. . Children: Do not bring any children with you. . Dress appropriately: Bring dark clothing that you would not mind if they get  stained. . Valuables: Do not bring any jewelry or valuables. Procedure appointments are reserved for interventional treatments only. Marland Kitchen No Prescription Refills. . No medication changes will be discussed during procedure appointments. . No disability issues will be discussed. ____________________________________________________________________________________________  Pain Management Discharge Instructions  General Discharge Instructions :  If you need to reach your doctor call: Monday-Friday 8:00 am - 4:00 pm at [phone] or toll free [phone].  After clinic hours [phone] to have operator reach doctor.  Bring all of your medication bottles to all your appointments in the pain clinic.  To cancel or reschedule your appointment with Pain Management please remember to call 24 hours in advance to avoid a fee.  Refer to the educational materials which you have been given on: General Risks, I had my Procedure. Discharge Instructions, Post Sedation.  Post Procedure Instructions:  The drugs you were given will stay in your system until tomorrow, so for the next 24 hours you should not drive, make any legal decisions or drink any alcoholic beverages.  You may eat anything you prefer, but it is better to start with liquids then soups and crackers, and gradually work up to solid foods.  Please notify your doctor immediately if you have any unusual bleeding, trouble breathing or pain that is not related to your normal pain.  Depending on the type of procedure that was done, some parts of your body may feel week and/or numb.  This usually clears up by tonight or the next day.  Walk with the use of an assistive device or accompanied by an adult for the 24 hours.  You may use ice on the affected area for the first 24 hours.  Put ice in a Ziploc bag and cover with a towel and place against area 15 minutes on 15 minutes  off.  You may switch to heat after 24 hours. Knee Injection A knee  injection is a procedure to get medicine into your knee joint. Your health care provider puts a needle into the joint and injects medicine with an attached syringe. The injected medicine may relieve the pain, swelling, and stiffness of arthritis. The injected medicine may also help to lubricate and cushion your knee joint. You may need more than one injection. Tell a health care provider about:  Any allergies you have.  All medicines you are taking, including vitamins, herbs, eye drops, creams, and over-the-counter medicines.  Any problems you or family members have had with anesthetic medicines.  Any blood disorders you have.  Any surgeries you have had.  Any medical conditions you have. What are the risks? Generally, this is a safe procedure. However, problems may occur, including:  Infection.  Bleeding.  Worsening symptoms.  Damage to the area around your knee.  Allergic reaction to any of the medicines.  Skin reactions from repeated injections.  What happens before the procedure?  Ask your health care provider about changing or stopping your regular medicines. This is especially important if you are taking diabetes medicines or blood thinners.  Plan to have someone take you home after the procedure. What happens during the procedure?  You will sit or lie down in a position for your knee to be treated.  The skin over your kneecap will be cleaned with a germ-killing solution (antiseptic).  You will be given a medicine that numbs the area (local anesthetic). You may feel some stinging.  After your knee becomes numb, you will have a second injection. This is the medicine. This needle is carefully placed between your kneecap and your knee. The medicine is injected into the joint space.  At the end of the procedure, the needle will be removed.  A bandage (dressing) may be placed over the injection site. The procedure may vary among health care providers and  hospitals. What happens after the procedure?  You may have to move your knee through its full range of motion. This helps to get all of the medicine into your joint space.  Your blood pressure, heart rate, breathing rate, and blood oxygen level will be monitored often until the medicines you were given have worn off.  You will be watched to make sure that you do not have a reaction to the injected medicine. This information is not intended to replace advice given to you by your health care provider. Make sure you discuss any questions you have with your health care provider. Document Released: 01/14/2007 Document Revised: 03/24/2016 Document Reviewed: 09/02/2014 Elsevier Interactive Patient Education  2018 [place]  What are the risk, side effects and possible complications? Generally speaking, most procedures are safe.  However, with any procedure there are risks, side effects, and the possibility of complications.  The risks and complications are dependent upon the sites that are lesioned, or the type of nerve block to be performed.  The closer the procedure is to the spine, the more serious the risks are.  Great care is taken when placing the radio frequency needles, block needles or lesioning probes, but sometimes complications can occur. 1. Infection: Any time there is an injection through the skin, there is a risk of infection.  This is why sterile conditions are used for these blocks.  There are four possible types of infection. 1. Localized skin infection. 2. Central Nervous System Infection-This can  be in the form of Meningitis, which can be deadly. 3. Epidural Infections-This can be in the form of an epidural abscess, which can cause pressure inside of the spine, causing compression of the spinal cord with subsequent paralysis. This would require an emergency surgery to decompress, and there are no guarantees that the patient would recover from the  paralysis. 4. Discitis-This is an infection of the intervertebral discs.  It occurs in about 1% of discography procedures.  It is difficult to treat and it may lead to surgery.        2. Pain: the needles have to go through skin and soft tissues, will cause soreness.       3. Damage to internal structures:  The nerves to be lesioned may be near blood vessels or    other nerves which can be potentially damaged.       4. Bleeding: Bleeding is more common if the patient is taking blood thinners such as  aspirin, Coumadin, Ticiid, Plavix, etc., or if he/she have some genetic predisposition  such as hemophilia. Bleeding into the spinal canal can cause compression of the spinal  cord with subsequent paralysis.  This would require an emergency surgery to  decompress and there are no guarantees that the patient would recover from the  paralysis.       5. Pneumothorax:  Puncturing of a lung is a possibility, every time a needle is introduced in  the area of the chest or upper back.  Pneumothorax refers to free air around the  collapsed lung(s), inside of the thoracic cavity (chest cavity).  Another two possible  complications related to a similar event would include: Hemothorax and Chylothorax.   These are variations of the Pneumothorax, where instead of air around the collapsed  lung(s), you may have blood or chyle, respectively.       6. Spinal headaches: They may occur with any procedures in the area of the spine.       7. Persistent CSF (Cerebro-Spinal Fluid) leakage: This is a rare problem, but may occur  with prolonged intrathecal or epidural catheters either due to the formation of a fistulous  track or a dural tear.       8. Nerve damage: By working so close to the spinal cord, there is always a possibility of  nerve damage, which could be as serious as a permanent spinal cord injury with  paralysis.       9. Death:  Although rare, severe deadly allergic reactions known as "Anaphylactic  reaction" can  occur to any of the medications used.      10. Worsening of the symptoms:  We can always make thing worse.  What are the chances of something like this happening? Chances of any of this occuring are extremely low.  By statistics, you have more of a chance of getting killed in a motor vehicle accident: while driving to the hospital than any of the above occurring .  Nevertheless, you should be aware that they are possibilities.  In general, it is similar to taking a shower.  Everybody knows that you can slip, hit your head and get killed.  Does that mean that you should not shower again?  Nevertheless always keep in mind that statistics do not mean anything if you happen to be on the wrong side of them.  Even if a procedure has a 1 (one) in a 1,000,000 (million) chance of going wrong, it you happen to be that one..Also,  keep in mind that by statistics, you have more of a chance of having something go wrong when taking medications.  Who should not have this procedure? If you are on a blood thinning medication (e.g. Coumadin, Plavix, see list of "Blood Thinners"), or if you have an active infection going on, you should not have the procedure.  If you are taking any blood thinners, please inform your physician.  How should I prepare for this procedure?  Do not eat or drink anything at least six hours prior to the procedure.  Bring a driver with you .  It cannot be a taxi.  Come accompanied by an adult that can drive you back, and that is strong enough to help you if your legs get weak or numb from the local anesthetic.  Take all of your medicines the morning of the procedure with just enough water to swallow them.  If you have diabetes, make sure that you are scheduled to have your procedure done first thing in the morning, whenever possible.  If you have diabetes, take only half of your insulin dose and notify our nurse that you have done so as soon as you arrive at the clinic.  If you are  diabetic, but only take blood sugar pills (oral hypoglycemic), then do not take them on the morning of your procedure.  You may take them after you have had the procedure.  Do not take aspirin or any aspirin-containing medications, at least eleven (11) days prior to the procedure.  They may prolong bleeding.  Wear loose fitting clothing that may be easy to take off and that you would not mind if it got stained with Betadine or blood.  Do not wear any jewelry or perfume  Remove any nail coloring.  It will interfere with some of our monitoring equipment.  NOTE: Remember that this is not meant to be interpreted as a complete list of all possible complications.  Unforeseen problems may occur.  BLOOD THINNERS The following drugs contain aspirin or other products, which can cause increased bleeding during surgery and should not be taken for 2 weeks prior to and 1 week after surgery.  If you should need take something for relief of minor pain, you may take acetaminophen which is found in Tylenol,m Datril, Anacin-3 and Panadol. It is not blood thinner. The products listed below are.  Do not take any of the products listed below in addition to any listed on your instruction sheet.  A.P.C or A.P.C with Codeine Codeine Phosphate Capsules #3 Ibuprofen Ridaura  ABC compound Congesprin Imuran rimadil  Advil Cope Indocin Robaxisal  Alka-Seltzer Effervescent Pain Reliever and Antacid Coricidin or Coricidin-D  Indomethacin Rufen  Alka-Seltzer plus Cold Medicine Cosprin Ketoprofen S-A-C Tablets  Anacin Analgesic Tablets or Capsules Coumadin Korlgesic Salflex  Anacin Extra Strength Analgesic tablets or capsules CP-2 Tablets Lanoril Salicylate  Anaprox Cuprimine Capsules Levenox Salocol  Anexsia-D Dalteparin Magan Salsalate  Anodynos Darvon compound Magnesium Salicylate Sine-off  Ansaid Dasin Capsules Magsal Sodium Salicylate  Anturane Depen Capsules Marnal Soma  APF Arthritis pain formula Dewitt's Pills  Measurin Stanback  Argesic Dia-Gesic Meclofenamic Sulfinpyrazone  Arthritis Bayer Timed Release Aspirin Diclofenac Meclomen Sulindac  Arthritis pain formula Anacin Dicumarol Medipren Supac  Analgesic (Safety coated) Arthralgen Diffunasal Mefanamic Suprofen  Arthritis Strength Bufferin Dihydrocodeine Mepro Compound Suprol  Arthropan liquid Dopirydamole Methcarbomol with Aspirin Synalgos  ASA tablets/Enseals Disalcid Micrainin Tagament  Ascriptin Doan's Midol Talwin  Ascriptin A/D Dolene Mobidin Tanderil  Ascriptin Extra Strength Dolobid  Moblgesic Ticlid  Ascriptin with Codeine Doloprin or Doloprin with Codeine Momentum Tolectin  Asperbuf Duoprin Mono-gesic Trendar  Aspergum Duradyne Motrin or Motrin IB Triminicin  Aspirin plain, buffered or enteric coated Durasal Myochrisine Trigesic  Aspirin Suppositories Easprin Nalfon Trillsate  Aspirin with Codeine Ecotrin Regular or Extra Strength Naprosyn Uracel  Atromid-S Efficin Naproxen Ursinus  Auranofin Capsules Elmiron Neocylate Vanquish  Axotal Emagrin Norgesic Verin  Azathioprine Empirin or Empirin with Codeine Normiflo Vitamin E  Azolid Emprazil Nuprin Voltaren  Bayer Aspirin plain, buffered or children's or timed BC Tablets or powders Encaprin Orgaran Warfarin Sodium  Buff-a-Comp Enoxaparin Orudis Zorpin  Buff-a-Comp with Codeine Equegesic Os-Cal-Gesic   Buffaprin Excedrin plain, buffered or Extra Strength Oxalid   Bufferin Arthritis Strength Feldene Oxphenbutazone   Bufferin plain or Extra Strength Feldene Capsules Oxycodone with Aspirin   Bufferin with Codeine Fenoprofen Fenoprofen Pabalate or Pabalate-SF   Buffets II Flogesic Panagesic   Buffinol plain or Extra Strength Florinal or Florinal with Codeine Panwarfarin   Buf-Tabs Flurbiprofen Penicillamine   Butalbital Compound Four-way cold tablets Penicillin   Butazolidin Fragmin Pepto-Bismol   Carbenicillin Geminisyn Percodan   Carna Arthritis Reliever Geopen Persantine    Carprofen Gold's salt Persistin   Chloramphenicol Goody's Phenylbutazone   Chloromycetin Haltrain Piroxlcam   Clmetidine heparin Plaquenil   Cllnoril Hyco-pap Ponstel   Clofibrate Hydroxy chloroquine Propoxyphen         Before stopping any of these medications, be sure to consult the physician who ordered them.  Some, such as Coumadin (Warfarin) are ordered to prevent or treat serious conditions such as "deep thrombosis", "pumonary embolisms", and other heart problems.  The amount of time that you may need off of the medication may also vary with the medication and the reason for which you were taking it.  If you are taking any of these medications, please make sure you notify your pain physician before you undergo any procedures.

## 2017-05-03 ENCOUNTER — Telehealth: Payer: Self-pay | Admitting: *Deleted

## 2017-05-03 NOTE — Telephone Encounter (Signed)
Spoke with patient re; procedure on yesterday, verbalizes no questions or concerns.  

## 2017-05-04 ENCOUNTER — Telehealth: Payer: Self-pay | Admitting: *Deleted

## 2017-05-04 NOTE — Telephone Encounter (Signed)
Notified patient of LDCT lung cancer screening program results with recommendation for 6 month follow up imaging. Also notified of incidental findings noted below and is encouraged to discuss further with PCP who will receive a copy of this note and/or the CT report. Patient verbalizes understanding.   IMPRESSION: 1. Lung-RADS 3, probably benign findings. Irregular 5.0 mm right middle lobe solid pulmonary nodule. Short-term follow-up in 6 months is recommended with repeat low-dose chest CT without contrast (please use the following order, "CT CHEST LCS NODULE FOLLOW-UP W/O CM"). 2. Diffuse hepatic steatosis.

## 2017-05-14 NOTE — Progress Notes (Signed)
Patient's Name: Nicole Walter  MRN: [id number]  Referring Provider: Leone Haven, MD  DOB: September 28, 1961  PCP: Leone Haven, MD  DOS: 05/15/2017  Note by: Vevelyn Francois NP  Service setting: Ambulatory outpatient  Specialty: Interventional Pain Management  Location: [facility] (AMB) Pain Management Facility    Patient type: Established    Primary Reason(s) for Visit: Encounter for prescription drug management. (Level of risk: moderate)  CC: Neck Pain (worse on the right side) and Shoulder Pain (bilateral shoulder blades)  HPI  Ms. Parchment is a 56 y.o. year old, female patient, who comes today for a medication management evaluation. She has Anxiety and depression; Chronic pain syndrome; Lumbar spondylosis; Combined fat and carbohydrate induced hyperlipemia; Thoracic back pain; Chronic neck pain (Location of Primary Source of Pain) (Bilateral) (L>R); Concentration deficit; Hyperlipidemia; Cervical osteoarthritis; Fibromyalgia; Osteopenia; Skin lesions; Chronic hip pain (Bilateral) (L>R); Bursitis of right shoulder; Chronic Greater trochanteric bursitis (Bilateral) (L>R); Long term current use of opiate analgesic; Long term prescription opiate use; Opiate use; Chronic upper back pain (Location of Secondary source of pain) (Bilateral) (L>R); Chronic shoulder pain (Location of Tertiary source of pain) (Bilateral) (L>R); Chronic low back pain (Bilateral) (L>R); Chronic knee pain (Right); Chronic upper extremity pain (Left); Chronic lower extremity pain (Bilateral) (L>R); Chronic abdominal pain; Elevated sedimentation rate; Elevated C-reactive protein (CRP); Overweight (BMI 25.0-29.9); Greater trochanteric bursitis (Right); Subacromial bursitis of shoulder joint (Right); Osteoarthritis of hip (Bilateral) (L>R); Thoracic radiculitis (R>L); Thoracic spondylosis; Shortness of breath; Osteoarthritis of shoulder (Right); Osteoarthritis of knee (Right); Chronic knee arthropathy (Right); Chronic right hip pain; and  Muscle spasm on her problem list. Her primarily concern today is the Neck Pain (worse on the right side) and Shoulder Pain (bilateral shoulder blades)  Pain Assessment: Location:   Neck Radiating: shoulder blades Onset: More than a month ago Duration: Chronic pain Quality: Cramping, Burning (pulling) Severity: 0-No pain/10 (self-reported pain score)  Note: Reported level is compatible with observation.                   Effect on ADL:   Timing: Constant Modifying factors: medications, ice  Ms. Mansouri was last scheduled for an appointment on 03/19/2017 for medication management. During today's appointment we reviewed Ms. Milford's chronic pain status, as well as her outpatient medication regimen. She has chronic neck pain. She has radicular symptoms that do goes down into her shoulder. She denies any numbness or tingling. She states that she has a throbbing burning around her neck in the clavicle area. She states that approx. "2 months ago" that she felt like an explosion in her neck. She states that the symptoms are progressing. She was instructed to go to the emergency room but declined. She states that she Gabapentin is not effective for her fibromylagia. She wishes to discontinue the gabapentin. She has failed Lyrica in the past. She does continue to use it. She states that her depression medication, Trintellix 20 mg is too strong. She states that she has not been very active with the change in dosing.   The patient  reports that she does not use drugs. Her body mass index is 29.18 kg/m.  Further details on both, my assessment(s), as well as the proposed treatment plan, please see below.  Controlled Substance Pharmacotherapy Assessment REMS (Risk Evaluation and Mitigation Strategy)  Analgesic:Oxycodone one tablet by mouth 4 times a day (20 mg/dayof hydrocodone) MME/day:62m/day SHart Rochester RN  05/15/2017  9:50 AM  Sign at close encounter Nursing Pain  Medication Assessment:   Safety precautions to be maintained throughout the outpatient stay will include: orient to surroundings, keep bed in low position, maintain call bell within reach at all times, provide assistance with transfer out of bed and ambulation.  Medication Inspection Compliance: Pill count conducted under aseptic conditions, in front of the patient. Neither the pills nor the bottle was removed from the patient's sight at any time. Once count was completed pills were immediately returned to the patient in their original bottle.  Medication: Oxycodone IR Pill/Patch Count: 120 of 120 pills remain Pill/Patch Appearance: Markings consistent with prescribed medication Bottle Appearance: Standard pharmacy container. Clearly labeled. Filled Date: 07/08 / 2018 Last Medication intake:  Yesterday   Pharmacokinetics: Liberation and absorption (onset of action): WNL Distribution (time to peak effect): WNL Metabolism and excretion (duration of action): WNL         Pharmacodynamics: Desired effects: Analgesia: Ms. Tess reports >50% benefit. Functional ability: Patient reports that medication allows her to accomplish basic ADLs Clinically meaningful improvement in function (CMIF): Sustained CMIF goals met Perceived effectiveness: Described as relatively effective, allowing for increase in activities of daily living (ADL) Undesirable effects: Side-effects or Adverse reactions: None reported Monitoring: [place] PMP: Online review of the past 28-monthperiod conducted. Compliant with practice rules and regulations List of all UDS test(s) done:  Lab Results  Component Value Date   SUMMARY FINAL 03/19/2017   SUMMARY FINAL 07/20/2016   Last UDS on record: Summary  Date Value Ref Range Status  03/19/2017 FINAL  Final    Comment:    ==================================================================== TOXASSURE SELECT 13 (MW) ==================================================================== Test                              Result       Flag       Units Drug Present and Declared for Prescription Verification   Oxycodone                      512          EXPECTED   ng/mg creat   Oxymorphone                    446          EXPECTED   ng/mg creat   Noroxycodone                   1331         EXPECTED   ng/mg creat   Noroxymorphone                 256          EXPECTED   ng/mg creat    Sources of oxycodone are scheduled prescription medications.    Oxymorphone, noroxycodone, and noroxymorphone are expected    metabolites of oxycodone. Oxymorphone is also available as a    scheduled prescription medication. ==================================================================== Test                      Result    Flag   Units      Ref Range   Creatinine              111              mg/dL      >=20 ==================================================================== Declared Medications:  The flagging and interpretation on this report are based on the  following declared medications.  Unexpected results may arise from  inaccuracies in the declared medications.  **Note: The testing scope of this panel includes these medications:  Oxycodone  **Note: The testing scope of this panel does not include following  reported medications:  Fluticasone (Flonase)  Gabapentin  Hydroxyzine (Vistaril)  Multivitamin  Rabeprazole (Aciphex)  Simvastatin (Zocor)  Sucralfate (Carafate)  Tizanidine (Zanaflex)  Topical (Lidex)  Trazodone  Vitamin D  Vortioxetine (Trintellix) ==================================================================== For clinical consultation, please call [phone]. ====================================================================    UDS interpretation: Compliant          Medication Assessment Form: Reviewed. Patient indicates being compliant with therapy Treatment compliance: Compliant Risk Assessment Profile: Aberrant behavior: See prior evaluations. None observed or detected  today Comorbid factors increasing risk of overdose: See prior notes. No additional risks detected today Risk of substance use disorder (SUD): Low Opioid Risk Tool (ORT) Total Score:    Interpretation Table:  Score <3 = Low Risk for SUD  Score between 4-7 = Moderate Risk for SUD  Score >8 = High Risk for Opioid Abuse   Risk Mitigation Strategies:  Patient Counseling: Covered Patient-Prescriber Agreement (PPA): Present and active  Notification to other healthcare providers: Done  Pharmacologic Plan: No change in therapy, at this time  Laboratory Chemistry  Inflammation Markers (CRP: Acute Phase) (ESR: Chronic Phase) Lab Results  Component Value Date   CRP 2.1 (H) 07/20/2016   ESRSEDRATE 37 (H) 07/20/2016                 Renal Function Markers Lab Results  Component Value Date   BUN 13 01/15/2017   CREATININE 0.89 01/15/2017   GFRAA >60 07/20/2016   GFRNONAA >60 07/20/2016                 Hepatic Function Markers Lab Results  Component Value Date   AST 31 01/19/2017   ALT 45 (H) 01/19/2017   ALBUMIN 4.3 01/19/2017   ALKPHOS 74 01/19/2017   HCVAB NEGATIVE 02/14/2017                 Electrolytes Lab Results  Component Value Date   NA 141 01/15/2017   K 4.3 01/15/2017   CL 103 01/15/2017   CALCIUM 9.3 01/15/2017   MG 2.1 07/20/2016                 Neuropathy Markers Lab Results  Component Value Date   VITAMINB12 987 (H) 07/20/2016                 Bone Pathology Markers Lab Results  Component Value Date   ALKPHOS 74 01/19/2017   25OHVITD1 58 07/20/2016   25OHVITD2 <1.0 07/20/2016   25OHVITD3 58 07/20/2016   CALCIUM 9.3 01/15/2017                 Coagulation Parameters Lab Results  Component Value Date   PLT 300 01/15/2017                 Cardiovascular Markers Lab Results  Component Value Date   HGB 12.4 01/15/2017   HCT 37.8 01/15/2017                 Note: Lab results reviewed.  Recent Diagnostic Imaging Review  Ct Chest Lung Cancer  Screening Low Dose Wo Contrast  Result Date: 05/02/2017 CLINICAL DATA:  56 year old asymptomatic female former smoker with 35 pack-year smoking history, quit smoking 3 years prior. EXAM: CT CHEST WITHOUT CONTRAST LOW-DOSE FOR LUNG  CANCER SCREENING TECHNIQUE: Multidetector CT imaging of the chest was performed following the standard protocol without IV contrast. COMPARISON:  07/05/2016 chest radiograph. FINDINGS: Cardiovascular: Normal heart size. No significant pericardial fluid/thickening. Great vessels are normal in course and caliber. Mediastinum/Nodes: No discrete thyroid nodules. Unremarkable esophagus. No pathologically enlarged axillary, mediastinal or gross hilar lymph nodes, noting limited sensitivity for the detection of hilar adenopathy on this noncontrast study. Lungs/Pleura: No pneumothorax. No pleural effusion. No acute consolidative airspace disease or lung masses. There are 3 scattered solid pulmonary nodules, largest measuring 5.0 mm in volume derived mean diameter in the right middle lobe, which demonstrates an irregular contour. Upper abdomen: Diffuse hepatic steatosis. Musculoskeletal: No aggressive appearing focal osseous lesions. Mild thoracic spondylosis. IMPRESSION: 1. Lung-RADS 3, probably benign findings. Irregular 5.0 mm right middle lobe solid pulmonary nodule. Short-term follow-up in 6 months is recommended with repeat low-dose chest CT without contrast (please use the following order, "CT CHEST LCS NODULE FOLLOW-UP W/O CM"). 2. Diffuse hepatic steatosis. Electronically Signed   By: Ilona Sorrel M.D.   On: 05/02/2017 09:01   Note: Imaging results reviewed.          Meds   Current Meds  Medication Sig  . Calcium Carbonate-Vit D-Min (CALCIUM 1200 PO) Take by mouth.  . Cholecalciferol (VITAMIN D) 2000 units tablet Take 4,000 Units by mouth daily.  . fluocinonide ointment (LIDEX) 0.05 % APPLY TOPICALLY TWICE A DAY TO ITCHY AREAS ONLY. as needed  . fluticasone (FLONASE) 50  MCG/ACT nasal spray instill 2 sprays into each nostril once daily  . gabapentin (NEURONTIN) 300 MG capsule Take 1-3 capsules (300-900 mg total) by mouth every 8 (eight) hours. Follow titration schedule (Patient taking differently: Take 900 mg by mouth 4 (four) times daily. Follow titration schedule)  . glucosamine-chondroitin 500-400 MG tablet Take 1 tablet by mouth 3 (three) times daily.  . hydrOXYzine (VISTARIL) 25 MG capsule Take 25 mg by mouth 2 (two) times daily as needed.  . magnesium 30 MG tablet Take 30 mg by mouth 2 (two) times daily.  . metaxalone (SKELAXIN) 800 MG tablet Take 1 tablet (800 mg total) by mouth 3 (three) times daily.  . Multiple Vitamin (MULTI-VITAMINS) TABS Take 1 tablet by mouth daily.   . RABEprazole (ACIPHEX) 20 MG tablet Take 20 mg by mouth as needed.   . simvastatin (ZOCOR) 20 MG tablet take 1 tablet by mouth at bedtime  . sucralfate (CARAFATE) 1 g tablet take 1 tablet by mouth three times a day if needed for abdominal pain  . traZODone (DESYREL) 100 MG tablet take 1 tablet by mouth at bedtime if needed  . TRINTELLIX 10 MG TABS   . TRINTELLIX 20 MG TABS Take 20 mg by mouth every morning.    ROS  Constitutional: Denies any fever or chills Gastrointestinal: No reported hemesis, hematochezia, vomiting, or acute GI distress Musculoskeletal: Denies any acute onset joint swelling, redness, loss of ROM, or weakness Neurological: No reported episodes of acute onset apraxia, aphasia, dysarthria, agnosia, amnesia, paralysis, loss of coordination, or loss of consciousness  Allergies  Ms. Pacey is allergic to other and food.  PFSH  Drug: Ms. Mentel  reports that she does not use drugs. Alcohol:  reports that she does not drink alcohol. Tobacco:  reports that she quit smoking about 22 months ago. She started smoking about 43 years ago. She has a 35.00 pack-year smoking history. She has never used smokeless tobacco. Medical:  has a past medical history of Abnormal liver  enzymes (02/08/2016); Allergy; Anxiety; Chest pain (07/05/2016); Chronic pain; Depression; DJD (degenerative joint disease); Fibromyalgia; High cholesterol; and Rash of back (01/28/2016). Surgical: Ms. Mancebo  has a past surgical history that includes Cholecystectomy; Tubal ligation; Ankle surgery; Nasal sinus surgery; Tonsillectomy; Colonoscopy; Esophagogastroduodenoscopy (egd) with propofol (N/A, 02/22/2016); Colonoscopy with propofol (N/A, 02/22/2016); and Breast biopsy (Left, 1997). Family: family history includes Arthritis in her father, paternal grandfather, and paternal grandmother; Cancer in her brother and father; Dementia in her sister; Diabetes in her sister; Drug abuse in her brother, brother, and sister; Heart disease in her mother; Post-traumatic stress disorder in her brother; Stroke in her mother.  Constitutional Exam  General appearance: Well nourished, well developed, and well hydrated. In no apparent acute distress Vitals:   05/15/17 0939  BP: 128/73  Pulse: (!) 59  Resp: 18  Temp: 98.2 F (36.8 C)  TempSrc: Oral  SpO2: 98%  Weight: 170 lb (77.1 kg)  Height: '5\' 4"'  (1.626 m)   BMI Assessment: Estimated body mass index is 29.18 kg/m as calculated from the following:   Height as of this encounter: '5\' 4"'  (1.626 m).   Weight as of this encounter: 170 lb (77.1 kg).  BMI interpretation table: BMI level Category Range association with higher incidence of chronic pain  <18 kg/m2 Underweight   18.5-24.9 kg/m2 Ideal body weight   25-29.9 kg/m2 Overweight Increased incidence by 20%  30-34.9 kg/m2 Obese (Class I) Increased incidence by 68%  35-39.9 kg/m2 Severe obesity (Class II) Increased incidence by 136%  >40 kg/m2 Extreme obesity (Class III) Increased incidence by 254%   BMI Readings from Last 4 Encounters:  05/15/17 29.18 kg/m  05/02/17 29.18 kg/m  05/01/17 29.18 kg/m  04/18/17 29.52 kg/m   Wt Readings from Last 4 Encounters:  05/15/17 170 lb (77.1 kg)  05/02/17 170 lb  (77.1 kg)  05/01/17 170 lb (77.1 kg)  04/18/17 172 lb (78 kg)  Psych/Mental status: Alert, oriented x 3 (person, place, & time)       Eyes: PERLA Respiratory: No evidence of acute respiratory distress  Cervical Spine Exam  Inspection: No masses, redness, or swelling Alignment: Symmetrical Functional ROM: Unrestricted ROM      Stability: No instability detected Muscle strength & Tone: Functionally intact Sensory: Unimpaired Palpation: Complains of area being tender to palpation              Upper Extremity (UE) Exam    Side: Right upper extremity  Side: Left upper extremity  Inspection: No masses, redness, swelling, or asymmetry. No contractures  Inspection: No masses, redness, swelling, or asymmetry. No contractures  Functional ROM: Unrestricted ROM          Functional ROM: Unrestricted ROM          Muscle strength & Tone: Functionally intact  Muscle strength & Tone: Functionally intact  Sensory: Unimpaired  Sensory: Unimpaired  Palpation: No palpable anomalies              Palpation: No palpable anomalies              Specialized Test(s): Deferred         Specialized Test(s): Deferred          Thoracic Spine Exam  Inspection: No masses, redness, or swelling Alignment: Symmetrical Functional ROM: Unrestricted ROM Stability: No instability detected Sensory: Unimpaired Muscle strength & Tone: No palpable anomalies  Lumbar Spine Exam  Inspection: No masses, redness, or swelling Alignment: Symmetrical Functional ROM: Unrestricted ROM  Stability: No instability detected Muscle strength & Tone: Functionally intact Sensory: Unimpaired Palpation: No palpable anomalies       Provocative Tests: Lumbar Hyperextension and rotation test: evaluation deferred today       Patrick's Maneuver: evaluation deferred today                    Gait & Posture Assessment  Ambulation: Unassisted Gait: Relatively normal for age and body habitus Posture: WNL   Lower Extremity Exam    Side:  Right lower extremity  Side: Left lower extremity  Inspection: No masses, redness, swelling, or asymmetry. No contractures  Inspection: No masses, redness, swelling, or asymmetry. No contractures  Functional ROM: Unrestricted ROM          Functional ROM: Unrestricted ROM          Muscle strength & Tone: Functionally intact  Muscle strength & Tone: Functionally intact  Sensory: Unimpaired  Sensory: Unimpaired  Palpation: No palpable anomalies  Palpation: No palpable anomalies   Assessment  Primary Diagnosis & Pertinent Problem List: The primary encounter diagnosis was Chronic neck pain (Location of Primary Source of Pain) (Bilateral) (L>R). Diagnoses of Lumbar spondylosis, Fibromyalgia, Muscle spasm, and Chronic pain syndrome were also pertinent to this visit.  Status Diagnosis  Controlled Controlled Controlled 1. Chronic neck pain (Location of Primary Source of Pain) (Bilateral) (L>R)   2. Lumbar spondylosis   3. Fibromyalgia   4. Muscle spasm   5. Chronic pain syndrome     Problems updated and reviewed during this visit: Problem  Muscle Spasm   Plan of Care  Pharmacotherapy (Medications Ordered): Meds ordered this encounter  Medications  . oxyCODONE (OXY IR/ROXICODONE) 5 MG immediate release tablet    Sig: Take 1 tablet (5 mg total) by mouth every 6 (six) hours as needed for severe pain.    Dispense:  120 tablet    Refill:  0    Do not place this medication, or any other prescription from our practice, on "Automatic Refill". Patient may have prescription filled one day early if pharmacy is closed on scheduled refill date. Do not fill until: 06/12/2017 To last until:07/12/2017    Order Specific Question:   Supervising Provider    Answer:   Milinda Pointer [phone]  . oxyCODONE (OXY IR/ROXICODONE) 5 MG immediate release tablet    Sig: Take 1 tablet (5 mg total) by mouth every 6 (six) hours as needed for severe pain.    Dispense:  120 tablet    Refill:  0    Do not place this  medication, or any other prescription from our practice, on "Automatic Refill". Patient may have prescription filled one day early if pharmacy is closed on scheduled refill date. Do not fill until: 07/12/2017 To last until: 08/11/2017    Order Specific Question:   Supervising Provider    Answer:   Milinda Pointer [phone]   New Prescriptions   No medications on file   Medications administered today: Ms. Bruhn had no medications administered during this visit. Lab-work, procedure(s), and/or referral(s): Orders Placed This Encounter  Procedures  . DG Cervical Spine Complete  . Magnesium  . Sedimentation rate  . C-reactive protein   Imaging and/or referral(s): None  Interventional therapies: Planned, scheduled, and/or pending:   Not at this time    Considering:   Diagnostic bilateral cervical facet block Possible bilateral cervical facet radiofrequencyablation Diagnostic left-sided cervical epidural steroid injection Diagnostic bilateral intra-articular shoulder joint injection Diagnostic bilateral suprascapular nerve block  Possible bilateral suprascapular nerve radiofrequencyablation Diagnostic bilateral lumbar facet block Possible bilateral lumbar facet radiofrequencyablation  Diagnostic bilateral intra-articular hip joint injection Diagnostic bilateral femoral and obturator tickler branch blocks Possible bilateral hip joint radiofrequencyablation  Diagnosticright intra-articular knee joint injection Possible series of 5, right-sided, intra-articular knee joint injections with Hyalgan.  Possible right sided genicular nerve block Possible right sided genicular nerve radiofrequencyablation Possible left-sided lumbar epidural steroid injection   Palliative PRN treatment(s):   Palliative bilateral suprascapular nerve block Palliativeright intra-articular knee joint injection     Provider-requested follow-up: Return in about 2 months (around 07/16/2017) for  MedMgmt.  Future Appointments Date Time Provider [place]  05/16/2017 8:00 AM Milinda Pointer, MD [facility] None  05/24/2017 10:30 AM Gardiner Barefoot, DPM [facility]  07/16/2017 9:00 AM Vevelyn Francois, NP [facility] None  07/19/2017 9:15 AM Leone Haven, MD [facility] None   Primary Care Physician: Leone Haven, MD Location: [facility] Note by: Vevelyn Francois NP Date: 05/15/2017; Time: 2:48 PM  Pain Score Disclaimer: We use the NRS-11 scale. This is a self-reported, subjective measurement of pain severity with only modest accuracy. It is used primarily to identify changes within a particular patient. It must be understood that outpatient pain scales are significantly less accurate that those used for research, where they can be applied under ideal controlled circumstances with minimal exposure to variables. In reality, the score is likely to be a combination of pain intensity and pain affect, where pain affect describes the degree of emotional arousal or changes in action readiness caused by the sensory experience of pain. Factors such as social and work situation, setting, emotional state, anxiety levels, expectation, and prior pain experience may influence pain perception and show large inter-individual differences that may also be affected by time variables.  Patient instructions provided during this appointment: Patient Instructions   GENERAL RISKS AND COMPLICATIONS  What are the risk, side effects and possible complications? Generally speaking, most procedures are safe.  However, with any procedure there are risks, side effects, and the possibility of complications.  The risks and complications are dependent upon the sites that are lesioned, or the type of nerve block to be performed.  The closer the procedure is to the spine, the more serious the risks are.  Great care is taken when placing the radio frequency needles, block  needles or lesioning probes, but sometimes complications can occur. 1. Infection: Any time there is an injection through the skin, there is a risk of infection.  This is why sterile conditions are used for these blocks.  There are four possible types of infection. 1. Localized skin infection. 2. Central Nervous System Infection-This can be in the form of Meningitis, which can be deadly. 3. Epidural Infections-This can be in the form of an epidural abscess, which can cause pressure inside of the spine, causing compression of the spinal cord with subsequent paralysis. This would require an emergency surgery to decompress, and there are no guarantees that the patient would recover from the paralysis. 4. Discitis-This is an infection of the intervertebral discs.  It occurs in about 1% of discography procedures.  It is difficult to treat and it may lead to surgery.        2. Pain: the needles have to go through skin and soft tissues, will cause soreness.       3. Damage to internal structures:  The nerves to be lesioned may be near blood vessels or  other nerves which can be potentially damaged.       4. Bleeding: Bleeding is more common if the patient is taking blood thinners such as  aspirin, Coumadin, Ticiid, Plavix, etc., or if he/she have some genetic predisposition  such as hemophilia. Bleeding into the spinal canal can cause compression of the spinal  cord with subsequent paralysis.  This would require an emergency surgery to  decompress and there are no guarantees that the patient would recover from the  paralysis.       5. Pneumothorax:  Puncturing of a lung is a possibility, every time a needle is introduced in  the area of the chest or upper back.  Pneumothorax refers to free air around the  collapsed lung(s), inside of the thoracic cavity (chest cavity).  Another two possible  complications related to a similar event would include: Hemothorax and Chylothorax.   These are variations of the  Pneumothorax, where instead of air around the collapsed  lung(s), you may have blood or chyle, respectively.       6. Spinal headaches: They may occur with any procedures in the area of the spine.       7. Persistent CSF (Cerebro-Spinal Fluid) leakage: This is a rare problem, but may occur  with prolonged intrathecal or epidural catheters either due to the formation of a fistulous  track or a dural tear.       8. Nerve damage: By working so close to the spinal cord, there is always a possibility of  nerve damage, which could be as serious as a permanent spinal cord injury with  paralysis.       9. Death:  Although rare, severe deadly allergic reactions known as "Anaphylactic  reaction" can occur to any of the medications used.      10. Worsening of the symptoms:  We can always make thing worse.  What are the chances of something like this happening? Chances of any of this occuring are extremely low.  By statistics, you have more of a chance of getting killed in a motor vehicle accident: while driving to the hospital than any of the above occurring .  Nevertheless, you should be aware that they are possibilities.  In general, it is similar to taking a shower.  Everybody knows that you can slip, hit your head and get killed.  Does that mean that you should not shower again?  Nevertheless always keep in mind that statistics do not mean anything if you happen to be on the wrong side of them.  Even if a procedure has a 1 (one) in a 1,000,000 (million) chance of going wrong, it you happen to be that one..Also, keep in mind that by statistics, you have more of a chance of having something go wrong when taking medications.  Who should not have this procedure? If you are on a blood thinning medication (e.g. Coumadin, Plavix, see list of "Blood Thinners"), or if you have an active infection going on, you should not have the procedure.  If you are taking any blood thinners, please inform your physician.  How  should I prepare for this procedure?  Do not eat or drink anything at least six hours prior to the procedure.  Bring a driver with you .  It cannot be a taxi.  Come accompanied by an adult that can drive you back, and that is strong enough to help you if your legs get weak or numb from the local anesthetic.  Take  all of your medicines the morning of the procedure with just enough water to swallow them.  If you have diabetes, make sure that you are scheduled to have your procedure done first thing in the morning, whenever possible.  If you have diabetes, take only half of your insulin dose and notify our nurse that you have done so as soon as you arrive at the clinic.  If you are diabetic, but only take blood sugar pills (oral hypoglycemic), then do not take them on the morning of your procedure.  You may take them after you have had the procedure.  Do not take aspirin or any aspirin-containing medications, at least eleven (11) days prior to the procedure.  They may prolong bleeding.  Wear loose fitting clothing that may be easy to take off and that you would not mind if it got stained with Betadine or blood.  Do not wear any jewelry or perfume  Remove any nail coloring.  It will interfere with some of our monitoring equipment.  NOTE: Remember that this is not meant to be interpreted as a complete list of all possible complications.  Unforeseen problems may occur.  BLOOD THINNERS The following drugs contain aspirin or other products, which can cause increased bleeding during surgery and should not be taken for 2 weeks prior to and 1 week after surgery.  If you should need take something for relief of minor pain, you may take acetaminophen which is found in Tylenol,m Datril, Anacin-3 and Panadol. It is not blood thinner. The products listed below are.  Do not take any of the products listed below in addition to any listed on your instruction sheet.  A.P.C or A.P.C with Codeine Codeine  Phosphate Capsules #3 Ibuprofen Ridaura  ABC compound Congesprin Imuran rimadil  Advil Cope Indocin Robaxisal  Alka-Seltzer Effervescent Pain Reliever and Antacid Coricidin or Coricidin-D  Indomethacin Rufen  Alka-Seltzer plus Cold Medicine Cosprin Ketoprofen S-A-C Tablets  Anacin Analgesic Tablets or Capsules Coumadin Korlgesic Salflex  Anacin Extra Strength Analgesic tablets or capsules CP-2 Tablets Lanoril Salicylate  Anaprox Cuprimine Capsules Levenox Salocol  Anexsia-D Dalteparin Magan Salsalate  Anodynos Darvon compound Magnesium Salicylate Sine-off  Ansaid Dasin Capsules Magsal Sodium Salicylate  Anturane Depen Capsules Marnal Soma  APF Arthritis pain formula Dewitt's Pills Measurin Stanback  Argesic Dia-Gesic Meclofenamic Sulfinpyrazone  Arthritis Bayer Timed Release Aspirin Diclofenac Meclomen Sulindac  Arthritis pain formula Anacin Dicumarol Medipren Supac  Analgesic (Safety coated) Arthralgen Diffunasal Mefanamic Suprofen  Arthritis Strength Bufferin Dihydrocodeine Mepro Compound Suprol  Arthropan liquid Dopirydamole Methcarbomol with Aspirin Synalgos  ASA tablets/Enseals Disalcid Micrainin Tagament  Ascriptin Doan's Midol Talwin  Ascriptin A/D Dolene Mobidin Tanderil  Ascriptin Extra Strength Dolobid Moblgesic Ticlid  Ascriptin with Codeine Doloprin or Doloprin with Codeine Momentum Tolectin  Asperbuf Duoprin Mono-gesic Trendar  Aspergum Duradyne Motrin or Motrin IB Triminicin  Aspirin plain, buffered or enteric coated Durasal Myochrisine Trigesic  Aspirin Suppositories Easprin Nalfon Trillsate  Aspirin with Codeine Ecotrin Regular or Extra Strength Naprosyn Uracel  Atromid-S Efficin Naproxen Ursinus  Auranofin Capsules Elmiron Neocylate Vanquish  Axotal Emagrin Norgesic Verin  Azathioprine Empirin or Empirin with Codeine Normiflo Vitamin E  Azolid Emprazil Nuprin Voltaren  Bayer Aspirin plain, buffered or children's or timed BC Tablets or powders Encaprin Orgaran  Warfarin Sodium  Buff-a-Comp Enoxaparin Orudis Zorpin  Buff-a-Comp with Codeine Equegesic Os-Cal-Gesic   Buffaprin Excedrin plain, buffered or Extra Strength Oxalid   Bufferin Arthritis Strength Feldene Oxphenbutazone   Bufferin plain or Extra Strength Feldene Capsules  Oxycodone with Aspirin   Bufferin with Codeine Fenoprofen Fenoprofen Pabalate or Pabalate-SF   Buffets II Flogesic Panagesic   Buffinol plain or Extra Strength Florinal or Florinal with Codeine Panwarfarin   Buf-Tabs Flurbiprofen Penicillamine   Butalbital Compound Four-way cold tablets Penicillin   Butazolidin Fragmin Pepto-Bismol   Carbenicillin Geminisyn Percodan   Carna Arthritis Reliever Geopen Persantine   Carprofen Gold's salt Persistin   Chloramphenicol Goody's Phenylbutazone   Chloromycetin Haltrain Piroxlcam   Clmetidine heparin Plaquenil   Cllnoril Hyco-pap Ponstel   Clofibrate Hydroxy chloroquine Propoxyphen         Before stopping any of these medications, be sure to consult the physician who ordered them.  Some, such as Coumadin (Warfarin) are ordered to prevent or treat serious conditions such as "deep thrombosis", "pumonary embolisms", and other heart problems.  The amount of time that you may need off of the medication may also vary with the medication and the reason for which you were taking it.  If you are taking any of these medications, please make sure you notify your pain physician before you undergo any procedures.

## 2017-05-15 ENCOUNTER — Encounter: Payer: Self-pay | Admitting: Nurse Practitioner

## 2017-05-15 ENCOUNTER — Ambulatory Visit
Admission: RE | Admit: 2017-05-15 | Discharge: 2017-05-15 | Disposition: A | Discharge status: Home / Self Care | Payer: BLUE CROSS/BLUE SHIELD | Source: Ambulatory Visit | Attending: Nurse Practitioner | Admitting: Nurse Practitioner

## 2017-05-15 ENCOUNTER — Ambulatory Visit: Payer: BLUE CROSS/BLUE SHIELD | Attending: Nurse Practitioner | Admitting: Nurse Practitioner

## 2017-05-15 VITALS — BP 128/73 | HR 59 | Temp 98.2°F | Resp 18 | Ht 64.0 in | Wt 170.0 lb

## 2017-05-15 DIAGNOSIS — R109 Unspecified abdominal pain: Secondary | ICD-10-CM | POA: Insufficient documentation

## 2017-05-15 DIAGNOSIS — F329 Major depressive disorder, single episode, unspecified: Secondary | ICD-10-CM | POA: Diagnosis not present

## 2017-05-15 DIAGNOSIS — M797 Fibromyalgia: Secondary | ICD-10-CM | POA: Diagnosis not present

## 2017-05-15 DIAGNOSIS — G8929 Other chronic pain: Secondary | ICD-10-CM

## 2017-05-15 DIAGNOSIS — E782 Mixed hyperlipidemia: Secondary | ICD-10-CM | POA: Diagnosis not present

## 2017-05-15 DIAGNOSIS — Z833 Family history of diabetes mellitus: Secondary | ICD-10-CM | POA: Insufficient documentation

## 2017-05-15 DIAGNOSIS — M542 Cervicalgia: Secondary | ICD-10-CM | POA: Insufficient documentation

## 2017-05-15 DIAGNOSIS — Z8249 Family history of ischemic heart disease and other diseases of the circulatory system: Secondary | ICD-10-CM | POA: Insufficient documentation

## 2017-05-15 DIAGNOSIS — M47892 Other spondylosis, cervical region: Secondary | ICD-10-CM | POA: Insufficient documentation

## 2017-05-15 DIAGNOSIS — Z7951 Long term (current) use of inhaled steroids: Secondary | ICD-10-CM | POA: Insufficient documentation

## 2017-05-15 DIAGNOSIS — G894 Chronic pain syndrome: Secondary | ICD-10-CM | POA: Insufficient documentation

## 2017-05-15 DIAGNOSIS — M1711 Unilateral primary osteoarthritis, right knee: Secondary | ICD-10-CM | POA: Insufficient documentation

## 2017-05-15 DIAGNOSIS — M8580 Other specified disorders of bone density and structure, unspecified site: Secondary | ICD-10-CM | POA: Diagnosis not present

## 2017-05-15 DIAGNOSIS — E663 Overweight: Secondary | ICD-10-CM | POA: Insufficient documentation

## 2017-05-15 DIAGNOSIS — Z813 Family history of other psychoactive substance abuse and dependence: Secondary | ICD-10-CM | POA: Diagnosis not present

## 2017-05-15 DIAGNOSIS — M4724 Other spondylosis with radiculopathy, thoracic region: Secondary | ICD-10-CM | POA: Insufficient documentation

## 2017-05-15 DIAGNOSIS — M47816 Spondylosis without myelopathy or radiculopathy, lumbar region: Secondary | ICD-10-CM | POA: Diagnosis not present

## 2017-05-15 DIAGNOSIS — F419 Anxiety disorder, unspecified: Secondary | ICD-10-CM | POA: Diagnosis not present

## 2017-05-15 DIAGNOSIS — Z8261 Family history of arthritis: Secondary | ICD-10-CM | POA: Insufficient documentation

## 2017-05-15 DIAGNOSIS — M62838 Other muscle spasm: Secondary | ICD-10-CM | POA: Diagnosis not present

## 2017-05-15 DIAGNOSIS — Z79899 Other long term (current) drug therapy: Secondary | ICD-10-CM | POA: Insufficient documentation

## 2017-05-15 DIAGNOSIS — M160 Bilateral primary osteoarthritis of hip: Secondary | ICD-10-CM | POA: Insufficient documentation

## 2017-05-15 DIAGNOSIS — M7062 Trochanteric bursitis, left hip: Secondary | ICD-10-CM | POA: Insufficient documentation

## 2017-05-15 DIAGNOSIS — M19011 Primary osteoarthritis, right shoulder: Secondary | ICD-10-CM | POA: Insufficient documentation

## 2017-05-15 DIAGNOSIS — Z87891 Personal history of nicotine dependence: Secondary | ICD-10-CM | POA: Insufficient documentation

## 2017-05-15 DIAGNOSIS — M7061 Trochanteric bursitis, right hip: Secondary | ICD-10-CM | POA: Diagnosis not present

## 2017-05-15 DIAGNOSIS — Z823 Family history of stroke: Secondary | ICD-10-CM | POA: Diagnosis not present

## 2017-05-15 DIAGNOSIS — E7800 Pure hypercholesterolemia, unspecified: Secondary | ICD-10-CM | POA: Insufficient documentation

## 2017-05-15 DIAGNOSIS — M25561 Pain in right knee: Secondary | ICD-10-CM | POA: Diagnosis not present

## 2017-05-15 DIAGNOSIS — Z6829 Body mass index (BMI) 29.0-29.9, adult: Secondary | ICD-10-CM | POA: Insufficient documentation

## 2017-05-15 MED ORDER — OXYCODONE HCL 5 MG PO TABS: 5.0000 mg | ORAL_TABLET | Freq: Four times a day (QID) | ORAL | 0 refills | Status: DC | PRN

## 2017-05-15 NOTE — Patient Instructions (Addendum)

## 2017-05-15 NOTE — Progress Notes (Signed)
Nursing Pain Medication Assessment:  Safety precautions to be maintained throughout the outpatient stay will include: orient to surroundings, keep bed in low position, maintain call bell within reach at all times, provide assistance with transfer out of bed and ambulation.  Medication Inspection Compliance: Pill count conducted under aseptic conditions, in front of the patient. Neither the pills nor the bottle was removed from the patient's sight at any time. Once count was completed pills were immediately returned to the patient in their original bottle.  Medication: Oxycodone IR Pill/Patch Count: 120 of 120 pills remain Pill/Patch Appearance: Markings consistent with prescribed medication Bottle Appearance: Standard pharmacy container. Clearly labeled. Filled Date: 07/08 / 2018 Last Medication intake:  Yesterday

## 2017-05-16 ENCOUNTER — Encounter: Payer: Self-pay | Admitting: Pain Medicine

## 2017-05-16 ENCOUNTER — Ambulatory Visit: Payer: BLUE CROSS/BLUE SHIELD | Attending: Pain Medicine | Admitting: Pain Medicine

## 2017-05-16 ENCOUNTER — Telehealth: Payer: Self-pay | Admitting: *Deleted

## 2017-05-16 VITALS — BP 127/66 | HR 61 | Temp 96.3°F | Resp 13 | Ht 64.0 in | Wt 170.0 lb

## 2017-05-16 DIAGNOSIS — M1711 Unilateral primary osteoarthritis, right knee: Secondary | ICD-10-CM | POA: Diagnosis not present

## 2017-05-16 DIAGNOSIS — M25561 Pain in right knee: Secondary | ICD-10-CM

## 2017-05-16 DIAGNOSIS — F329 Major depressive disorder, single episode, unspecified: Secondary | ICD-10-CM | POA: Insufficient documentation

## 2017-05-16 DIAGNOSIS — F32A Depression, unspecified: Secondary | ICD-10-CM

## 2017-05-16 DIAGNOSIS — G8929 Other chronic pain: Secondary | ICD-10-CM | POA: Diagnosis not present

## 2017-05-16 DIAGNOSIS — F419 Anxiety disorder, unspecified: Secondary | ICD-10-CM | POA: Diagnosis not present

## 2017-05-16 LAB — SEDIMENTATION RATE: Sed Rate: 2 mm/hr (ref 0–40)

## 2017-05-16 LAB — MAGNESIUM: Magnesium: 2.2 mg/dL (ref 1.6–2.3)

## 2017-05-16 MED ORDER — FENTANYL CITRATE (PF) 100 MCG/2ML IJ SOLN
INTRAMUSCULAR | Status: AC
  Filled 2017-05-16: qty 2

## 2017-05-16 MED ORDER — ROPIVACAINE HCL 2 MG/ML IJ SOLN
INTRAMUSCULAR | Status: AC
  Filled 2017-05-16: qty 10

## 2017-05-16 MED ORDER — FENTANYL CITRATE (PF) 100 MCG/2ML IJ SOLN
25.0000 ug | INTRAMUSCULAR | Status: DC | PRN
  Administered 2017-05-16: 100 ug via INTRAVENOUS

## 2017-05-16 MED ORDER — ROPIVACAINE HCL 2 MG/ML IJ SOLN
2.0000 mL | Freq: Once | INTRAMUSCULAR | Status: AC
  Administered 2017-05-16: 2 mL via INTRA_ARTICULAR

## 2017-05-16 MED ORDER — SODIUM HYALURONATE (VISCOSUP) 20 MG/2ML IX SOSY
2.0000 mL | PREFILLED_SYRINGE | Freq: Once | INTRA_ARTICULAR | Status: AC
  Administered 2017-05-16: 2 mL via INTRA_ARTICULAR
  Filled 2017-05-16: qty 2

## 2017-05-16 MED ORDER — LIDOCAINE HCL (PF) 1 % IJ SOLN
INTRAMUSCULAR | Status: AC
  Filled 2017-05-16: qty 5

## 2017-05-16 MED ORDER — LIDOCAINE HCL (PF) 1 % IJ SOLN
5.0000 mL | Freq: Once | INTRAMUSCULAR | Status: AC
  Administered 2017-05-16: 5 mL

## 2017-05-16 MED ORDER — MIDAZOLAM HCL 5 MG/5ML IJ SOLN
1.0000 mg | INTRAMUSCULAR | Status: DC | PRN
  Administered 2017-05-16: 3 mg via INTRAVENOUS

## 2017-05-16 MED ORDER — MIDAZOLAM HCL 5 MG/5ML IJ SOLN
INTRAMUSCULAR | Status: AC
  Filled 2017-05-16: qty 5

## 2017-05-16 MED ORDER — LACTATED RINGERS IV SOLN
1000.0000 mL | Freq: Once | INTRAVENOUS | Status: AC
  Administered 2017-05-16: 1000 mL via INTRAVENOUS

## 2017-05-16 NOTE — Telephone Encounter (Signed)
Pt has lab appt on 05/17/17. Please place "future" orders.

## 2017-05-16 NOTE — Patient Instructions (Addendum)
____________________________________________________________________________________________  Post-Procedure instructions Instructions:  Apply ice: Fill a plastic sandwich bag with crushed ice. Cover it with a small towel and apply to injection site. Apply for 15 minutes then remove x 15 minutes. Repeat sequence on day of procedure, until you go to bed. The purpose is to minimize swelling and discomfort after procedure.  Apply heat: Apply heat to procedure site starting the day following the procedure. The purpose is to treat any soreness and discomfort from the procedure.  Food intake: Start with clear liquids (like water) and advance to regular food, as tolerated.   Physical activities: Keep activities to a minimum for the first 8 hours after the procedure.   Driving: If you have received any sedation, you are not allowed to drive for 24 hours after your procedure.  Blood thinner: Restart your blood thinner 6 hours after your procedure. (Only for those taking blood thinners)  Insulin: As soon as you can eat, you may resume your normal dosing schedule. (Only for those taking insulin)  Infection prevention: Keep procedure site clean and dry.  Post-procedure Pain Diary: Extremely important that this be done correctly and accurately. Recorded information will be used to determine the next step in treatment.  Pain evaluated is that of treated area only. Do not include pain from an untreated area.  Complete every hour, on the hour, for the initial 8 hours. Set an alarm to help you do this part accurately.  Do not go to sleep and have it completed later. It will not be accurate.  Follow-up appointment: Keep your follow-up appointment after the procedure. Usually 2 weeks for most procedures. (6 weeks in the case of radiofrequency.) Bring you pain diary.  Expect:  From numbing medicine (AKA: Local Anesthetics): Numbness or decrease in pain.  Onset: Full effect within 15 minutes of  injected.  Duration: It will depend on the type of local anesthetic used. On the average, 1 to 8 hours.   From steroids: Decrease in swelling or inflammation. Once inflammation is improved, relief of the pain will follow.  Onset of benefits: Depends on the amount of swelling present. The more swelling, the longer it will take for the benefits to be seen. In some cases, up to 10 days.  Duration: Steroids will stay in the system x 2 weeks. Duration of benefits will depend on multiple posibilities including persistent irritating factors.  From procedure: Some discomfort is to be expected once the numbing medicine wears off. This should be minimal if ice and heat are applied as instructed. Call if:  You experience numbness and weakness that gets worse with time, as opposed to wearing off.  New onset bowel or bladder incontinence. (Spinal procedures only)  Emergency Numbers:  Durning business hours (Monday - Thursday, 8:00 AM - 4:00 PM) (Friday, 9:00 AM - 12:00 Noon): [phone]  After hours: [phone] ____________________________________________________________________________________________  Pain Management Discharge Instructions  General Discharge Instructions :  If you need to reach your doctor call: Monday-Friday 8:00 am - 4:00 pm at [phone] or toll free [phone].  After clinic hours [phone] to have operator reach doctor.  Bring all of your medication bottles to all your appointments in the pain clinic.  To cancel or reschedule your appointment with Pain Management please remember to call 24 hours in advance to avoid a fee.  Refer to the educational materials which you have been given on: General Risks, I had my Procedure. Discharge Instructions, Post Sedation.  Post Procedure Instructions:  The drugs you   were given will stay in your system until tomorrow, so for the next 24 hours you should not drive, make any legal decisions or drink any alcoholic  beverages.  You may eat anything you prefer, but it is better to start with liquids then soups and crackers, and gradually work up to solid foods.  Please notify your doctor immediately if you have any unusual bleeding, trouble breathing or pain that is not related to your normal pain.  Depending on the type of procedure that was done, some parts of your body may feel week and/or numb.  This usually clears up by tonight or the next day.  Walk with the use of an assistive device or accompanied by an adult for the 24 hours.  You may use ice on the affected area for the first 24 hours.  Put ice in a Ziploc bag and cover with a towel and place against area 15 minutes on 15 minutes off.  You may switch to heat after 24 hours. Knee Injection A knee injection is a procedure to get medicine into your knee joint. Your health care provider puts a needle into the joint and injects medicine with an attached syringe. The injected medicine may relieve the pain, swelling, and stiffness of arthritis. The injected medicine may also help to lubricate and cushion your knee joint. You may need more than one injection. Tell a health care provider about:  Any allergies you have.  All medicines you are taking, including vitamins, herbs, eye drops, creams, and over-the-counter medicines.  Any problems you or family members have had with anesthetic medicines.  Any blood disorders you have.  Any surgeries you have had.  Any medical conditions you have. What are the risks? Generally, this is a safe procedure. However, problems may occur, including:  Infection.  Bleeding.  Worsening symptoms.  Damage to the area around your knee.  Allergic reaction to any of the medicines.  Skin reactions from repeated injections.  What happens before the procedure?  Ask your health care provider about changing or stopping your regular medicines. This is especially important if you are taking diabetes medicines or  blood thinners.  Plan to have someone take you home after the procedure. What happens during the procedure?  You will sit or lie down in a position for your knee to be treated.  The skin over your kneecap will be cleaned with a germ-killing solution (antiseptic).  You will be given a medicine that numbs the area (local anesthetic). You may feel some stinging.  After your knee becomes numb, you will have a second injection. This is the medicine. This needle is carefully placed between your kneecap and your knee. The medicine is injected into the joint space.  At the end of the procedure, the needle will be removed.  A bandage (dressing) may be placed over the injection site. The procedure may vary among health care providers and hospitals. What happens after the procedure?  You may have to move your knee through its full range of motion. This helps to get all of the medicine into your joint space.  Your blood pressure, heart rate, breathing rate, and blood oxygen level will be monitored often until the medicines you were given have worn off.  You will be watched to make sure that you do not have a reaction to the injected medicine. This information is not intended to replace advice given to you by your health care provider. Make sure you discuss any questions you  have with your health care provider. Document Released: 01/14/2007 Document Revised: 03/24/2016 Document Reviewed: 09/02/2014 Elsevier Interactive Patient Education  2018 [employer]  What are the risk, side effects and possible complications? Generally speaking, most procedures are safe.  However, with any procedure there are risks, side effects, and the possibility of complications.  The risks and complications are dependent upon the sites that are lesioned, or the type of nerve block to be performed.  The closer the procedure is to the spine, the more serious the risks are.  Great care is  taken when placing the radio frequency needles, block needles or lesioning probes, but sometimes complications can occur. 1. Infection: Any time there is an injection through the skin, there is a risk of infection.  This is why sterile conditions are used for these blocks.  There are four possible types of infection. 1. Localized skin infection. 2. Central Nervous System Infection-This can be in the form of Meningitis, which can be deadly. 3. Epidural Infections-This can be in the form of an epidural abscess, which can cause pressure inside of the spine, causing compression of the spinal cord with subsequent paralysis. This would require an emergency surgery to decompress, and there are no guarantees that the patient would recover from the paralysis. 4. Discitis-This is an infection of the intervertebral discs.  It occurs in about 1% of discography procedures.  It is difficult to treat and it may lead to surgery.        2. Pain: the needles have to go through skin and soft tissues, will cause soreness.       3. Damage to internal structures:  The nerves to be lesioned may be near blood vessels or    other nerves which can be potentially damaged.       4. Bleeding: Bleeding is more common if the patient is taking blood thinners such as  aspirin, Coumadin, Ticiid, Plavix, etc., or if he/she have some genetic predisposition  such as hemophilia. Bleeding into the spinal canal can cause compression of the spinal  cord with subsequent paralysis.  This would require an emergency surgery to  decompress and there are no guarantees that the patient would recover from the  paralysis.       5. Pneumothorax:  Puncturing of a lung is a possibility, every time a needle is introduced in  the area of the chest or upper back.  Pneumothorax refers to free air around the  collapsed lung(s), inside of the thoracic cavity (chest cavity).  Another two possible  complications related to a similar event would include: Hemothorax  and Chylothorax.   These are variations of the Pneumothorax, where instead of air around the collapsed  lung(s), you may have blood or chyle, respectively.       6. Spinal headaches: They may occur with any procedures in the area of the spine.       7. Persistent CSF (Cerebro-Spinal Fluid) leakage: This is a rare problem, but may occur  with prolonged intrathecal or epidural catheters either due to the formation of a fistulous  track or a dural tear.       8. Nerve damage: By working so close to the spinal cord, there is always a possibility of  nerve damage, which could be as serious as a permanent spinal cord injury with  paralysis.       9. Death:  Although rare, severe deadly allergic reactions known as "Anaphylactic  reaction" can  occur to any of the medications used.      10. Worsening of the symptoms:  We can always make thing worse.  What are the chances of something like this happening? Chances of any of this occuring are extremely low.  By statistics, you have more of a chance of getting killed in a motor vehicle accident: while driving to the hospital than any of the above occurring .  Nevertheless, you should be aware that they are possibilities.  In general, it is similar to taking a shower.  Everybody knows that you can slip, hit your head and get killed.  Does that mean that you should not shower again?  Nevertheless always keep in mind that statistics do not mean anything if you happen to be on the wrong side of them.  Even if a procedure has a 1 (one) in a 1,000,000 (million) chance of going wrong, it you happen to be that one..Also, keep in mind that by statistics, you have more of a chance of having something go wrong when taking medications.  Who should not have this procedure? If you are on a blood thinning medication (e.g. Coumadin, Plavix, see list of "Blood Thinners"), or if you have an active infection going on, you should not have the procedure.  If you are taking any blood  thinners, please inform your physician.  How should I prepare for this procedure?  Do not eat or drink anything at least six hours prior to the procedure.  Bring a driver with you .  It cannot be a taxi.  Come accompanied by an adult that can drive you back, and that is strong enough to help you if your legs get weak or numb from the local anesthetic.  Take all of your medicines the morning of the procedure with just enough water to swallow them.  If you have diabetes, make sure that you are scheduled to have your procedure done first thing in the morning, whenever possible.  If you have diabetes, take only half of your insulin dose and notify our nurse that you have done so as soon as you arrive at the clinic.  If you are diabetic, but only take blood sugar pills (oral hypoglycemic), then do not take them on the morning of your procedure.  You may take them after you have had the procedure.  Do not take aspirin or any aspirin-containing medications, at least eleven (11) days prior to the procedure.  They may prolong bleeding.  Wear loose fitting clothing that may be easy to take off and that you would not mind if it got stained with Betadine or blood.  Do not wear any jewelry or perfume  Remove any nail coloring.  It will interfere with some of our monitoring equipment.  NOTE: Remember that this is not meant to be interpreted as a complete list of all possible complications.  Unforeseen problems may occur.  BLOOD THINNERS The following drugs contain aspirin or other products, which can cause increased bleeding during surgery and should not be taken for 2 weeks prior to and 1 week after surgery.  If you should need take something for relief of minor pain, you may take acetaminophen which is found in Tylenol,m Datril, Anacin-3 and Panadol. It is not blood thinner. The products listed below are.  Do not take any of the products listed below in addition to any listed on your instruction  sheet.  A.P.C or A.P.C with Codeine Codeine Phosphate Capsules #3 Ibuprofen Ridaura  ABC compound Congesprin Imuran rimadil  Advil Cope Indocin Robaxisal  Alka-Seltzer Effervescent Pain Reliever and Antacid Coricidin or Coricidin-D  Indomethacin Rufen  Alka-Seltzer plus Cold Medicine Cosprin Ketoprofen S-A-C Tablets  Anacin Analgesic Tablets or Capsules Coumadin Korlgesic Salflex  Anacin Extra Strength Analgesic tablets or capsules CP-2 Tablets Lanoril Salicylate  Anaprox Cuprimine Capsules Levenox Salocol  Anexsia-D Dalteparin Magan Salsalate  Anodynos Darvon compound Magnesium Salicylate Sine-off  Ansaid Dasin Capsules Magsal Sodium Salicylate  Anturane Depen Capsules Marnal Soma  APF Arthritis pain formula Dewitt's Pills Measurin Stanback  Argesic Dia-Gesic Meclofenamic Sulfinpyrazone  Arthritis Bayer Timed Release Aspirin Diclofenac Meclomen Sulindac  Arthritis pain formula Anacin Dicumarol Medipren Supac  Analgesic (Safety coated) Arthralgen Diffunasal Mefanamic Suprofen  Arthritis Strength Bufferin Dihydrocodeine Mepro Compound Suprol  Arthropan liquid Dopirydamole Methcarbomol with Aspirin Synalgos  ASA tablets/Enseals Disalcid Micrainin Tagament  Ascriptin Doan's Midol Talwin  Ascriptin A/D Dolene Mobidin Tanderil  Ascriptin Extra Strength Dolobid Moblgesic Ticlid  Ascriptin with Codeine Doloprin or Doloprin with Codeine Momentum Tolectin  Asperbuf Duoprin Mono-gesic Trendar  Aspergum Duradyne Motrin or Motrin IB Triminicin  Aspirin plain, buffered or enteric coated Durasal Myochrisine Trigesic  Aspirin Suppositories Easprin Nalfon Trillsate  Aspirin with Codeine Ecotrin Regular or Extra Strength Naprosyn Uracel  Atromid-S Efficin Naproxen Ursinus  Auranofin Capsules Elmiron Neocylate Vanquish  Axotal Emagrin Norgesic Verin  Azathioprine Empirin or Empirin with Codeine Normiflo Vitamin E  Azolid Emprazil Nuprin Voltaren  Bayer Aspirin plain, buffered or children's or  timed BC Tablets or powders Encaprin Orgaran Warfarin Sodium  Buff-a-Comp Enoxaparin Orudis Zorpin  Buff-a-Comp with Codeine Equegesic Os-Cal-Gesic   Buffaprin Excedrin plain, buffered or Extra Strength Oxalid   Bufferin Arthritis Strength Feldene Oxphenbutazone   Bufferin plain or Extra Strength Feldene Capsules Oxycodone with Aspirin   Bufferin with Codeine Fenoprofen Fenoprofen Pabalate or Pabalate-SF   Buffets II Flogesic Panagesic   Buffinol plain or Extra Strength Florinal or Florinal with Codeine Panwarfarin   Buf-Tabs Flurbiprofen Penicillamine   Butalbital Compound Four-way cold tablets Penicillin   Butazolidin Fragmin Pepto-Bismol   Carbenicillin Geminisyn Percodan   Carna Arthritis Reliever Geopen Persantine   Carprofen Gold's salt Persistin   Chloramphenicol Goody's Phenylbutazone   Chloromycetin Haltrain Piroxlcam   Clmetidine heparin Plaquenil   Cllnoril Hyco-pap Ponstel   Clofibrate Hydroxy chloroquine Propoxyphen         Before stopping any of these medications, be sure to consult the physician who ordered them.  Some, such as Coumadin (Warfarin) are ordered to prevent or treat serious conditions such as "deep thrombosis", "pumonary embolisms", and other heart problems.  The amount of time that you may need off of the medication may also vary with the medication and the reason for which you were taking it.  If you are taking any of these medications, please make sure you notify your pain physician before you undergo any procedures.         Pain Management Discharge Instructions  General Discharge Instructions :  If you need to reach your doctor call: Monday-Friday 8:00 am - 4:00 pm at [phone] or toll free [phone].  After clinic hours [phone] to have operator reach doctor.  Bring all of your medication bottles to all your appointments in the pain clinic.  To cancel or reschedule your appointment with Pain Management please remember to call 24  hours in advance to avoid a fee.  Refer to the educational materials which you have been given on: General Risks, I had my Procedure. Discharge Instructions, Post Sedation.  Post Procedure Instructions:  The drugs you were given will stay in your system until tomorrow, so for the next 24 hours you should not drive, make any legal decisions or drink any alcoholic beverages.  You may eat anything you prefer, but it is better to start with liquids then soups and crackers, and gradually work up to solid foods.  Please notify your doctor immediately if you have any unusual bleeding, trouble breathing or pain that is not related to your normal pain.  Depending on the type of procedure that was done, some parts of your body may feel week and/or numb.  This usually clears up by tonight or the next day.  Walk with the use of an assistive device or accompanied by an adult for the 24 hours.  You may use ice on the affected area for the first 24 hours.  Put ice in a Ziploc bag and cover with a towel and place against area 15 minutes on 15 minutes off.  You may switch to heat after 24 hours.

## 2017-05-16 NOTE — Telephone Encounter (Signed)
Patient does not need labs she has already had them drawn from [facility], I have cancelled appmt

## 2017-05-16 NOTE — Progress Notes (Signed)
Patient's Name: Nicole Walter  MRN: [id number]  Referring Provider: Leone Haven, MD  DOB: Jan 28, 1961  PCP: Leone Haven, MD  DOS: 05/16/2017  Note by: Gaspar Cola, MD  Service setting: Ambulatory outpatient  Specialty: Interventional Pain Management  Patient type: Established  Location: [facility] (AMB) Pain Management Facility  Visit type: Interventional Procedure   Primary Reason for Visit: Interventional Pain Management Treatment. CC: Knee Pain (right)  Procedure:  Anesthesia, Analgesia, Anxiolysis:  Type: Therapeutic Intra-Articular Hyalgan Knee Injection #5 Region: Lateral infrapatellar Knee Region Level: Knee Joint Laterality: Right knee  Type: Local Anesthesia with Moderate (Conscious) Sedation Local Anesthetic: Lidocaine 1% Route: Intravenous (IV) IV Access: Secured Sedation: Meaningful verbal contact was maintained at all times during the procedure  Indication(s): Analgesia and Anxiety  Indications: 1. Osteoarthritis of knee (Right)   2. Chronic knee arthropathy (Right)   3. Chronic knee pain (Right)   4. Anxiety and depression    Pain Score: Pre-procedure: 0-No pain/10 Post-procedure: 0-No pain/10  Pre-op Assessment:  Previous date of service: 05/15/17 Service provided: Med Refill Nicole Walter is a 56 y.o. (year old), female patient, seen today for interventional treatment. She  has a past surgical history that includes Cholecystectomy; Tubal ligation; Ankle surgery; Nasal sinus surgery; Tonsillectomy; Colonoscopy; Esophagogastroduodenoscopy (egd) with propofol (N/A, 02/22/2016); Colonoscopy with propofol (N/A, 02/22/2016); and Breast biopsy (Left, 1997). Nicole Walter has a current medication list which includes the following prescription(s): calcium carbonate-vit d-min, vitamin d, fluocinonide ointment, fluticasone, gabapentin, glucosamine-chondroitin, hydroxyzine, magnesium, metaxalone, multi-vitamins, oxycodone, oxycodone, rabeprazole, simvastatin, sucralfate,  trazodone, trintellix, and trintellix, and the following Facility-Administered Medications: fentanyl and midazolam. Her primarily concern today is the Knee Pain (right)  Initial Vital Signs: There were no vitals taken for this visit. BMI: 29.18 kg/m  Risk Assessment: Allergies: Reviewed. She is allergic to other and food.  Allergy Precautions: None required Coagulopathies: Reviewed. None identified.  Blood-thinner therapy: None at this time Active Infection(s): Reviewed. None identified. Nicole Walter is afebrile  Site Confirmation: Nicole Walter was asked to confirm the procedure and laterality before marking the site Procedure checklist: Completed Consent: Before the procedure and under the influence of no sedative(s), amnesic(s), or anxiolytics, the patient was informed of the treatment options, risks and possible complications. To fulfill our ethical and legal obligations, as recommended by the American Medical Association's Code of Ethics, I have informed the patient of my clinical impression; the nature and purpose of the treatment or procedure; the risks, benefits, and possible complications of the intervention; the alternatives, including doing nothing; the risk(s) and benefit(s) of the alternative treatment(s) or procedure(s); and the risk(s) and benefit(s) of doing nothing. The patient was provided information about the general risks and possible complications associated with the procedure. These may include, but are not limited to: failure to achieve desired goals, infection, bleeding, organ or nerve damage, allergic reactions, paralysis, and death. In addition, the patient was informed of those risks and complications associated to the procedure, such as failure to decrease pain; infection; bleeding; organ or nerve damage with subsequent damage to sensory, motor, and/or autonomic systems, resulting in permanent pain, numbness, and/or weakness of one or several areas of the body; allergic  reactions; (i.e.: anaphylactic reaction); and/or death. Furthermore, the patient was informed of those risks and complications associated with the medications. These include, but are not limited to: allergic reactions (i.e.: anaphylactic or anaphylactoid reaction(s)); adrenal axis suppression; blood sugar elevation that in diabetics may result in ketoacidosis or comma; water retention that in patients with  history of congestive heart failure may result in shortness of breath, pulmonary edema, and decompensation with resultant heart failure; weight gain; swelling or edema; medication-induced neural toxicity; particulate matter embolism and blood vessel occlusion with resultant organ, and/or nervous system infarction; and/or aseptic necrosis of one or more joints. Finally, the patient was informed that Medicine is not an exact science; therefore, there is also the possibility of unforeseen or unpredictable risks and/or possible complications that may result in a catastrophic outcome. The patient indicated having understood very clearly. We have given the patient no guarantees and we have made no promises. Enough time was given to the patient to ask questions, all of which were answered to the patient's satisfaction. Nicole Walter has indicated that she wanted to continue with the procedure. Attestation: I, the ordering provider, attest that I have discussed with the patient the benefits, risks, side-effects, alternatives, likelihood of achieving goals, and potential problems during recovery for the procedure that I have provided informed consent. Date: 05/16/2017; Time: 8:00 AM  Pre-Procedure Preparation:  Monitoring: As per clinic protocol. Respiration, ETCO2, SpO2, BP, heart rate and rhythm monitor placed and checked for adequate function Safety Precautions: Patient was assessed for positional comfort and pressure points before starting the procedure. Time-out: I initiated and conducted the "Time-out" before  starting the procedure, as per protocol. The patient was asked to participate by confirming the accuracy of the "Time Out" information. Verification of the correct person, site, and procedure were performed and confirmed by me, the nursing staff, and the patient. "Time-out" conducted as per Joint Commission's Universal Protocol (UP.01.01.01). "Time-out" Date & Time: 05/16/2017; 0844 hrs.  Description of Procedure Process:   Position: Sitting Target Area: Knee Joint Approach: Just above the Lateral tibial plateau, lateral to the infrapatellar tendon. Area Prepped: Entire knee area, from the mid-thigh to the mid-shin. Prepping solution: ChloraPrep (2% chlorhexidine gluconate and 70% isopropyl alcohol) Safety Precautions: Aspiration looking for blood return was conducted prior to all injections. At no point did we inject any substances, as a needle was being advanced. No attempts were made at seeking any paresthesias. Safe injection practices and needle disposal techniques used. Medications properly checked for expiration dates. SDV (single dose vial) medications used. Description of the Procedure: Protocol guidelines were followed. The patient was placed in position over the fluoroscopy table. The target area was identified and the area prepped in the usual manner. Skin desensitized using vapocoolant spray. Skin & deeper tissues infiltrated with local anesthetic. Appropriate amount of time allowed to pass for local anesthetics to take effect. The procedure needles were then advanced to the target area. Proper needle placement secured. Negative aspiration confirmed. Solution injected in intermittent fashion, asking for systemic symptoms every 0.5cc of injectate. The needles were then removed and the area cleansed, making sure to leave some of the prepping solution back to take advantage of its long term bactericidal properties. Vitals:   05/16/17 0853 05/16/17 0903 05/16/17 0913 05/16/17 0915  BP: (!)  118/91 93/64 99/71  127/66  Pulse:      Resp: 10 13 13 13   Temp: (!) 96.3 F (35.7 C)     TempSrc:      SpO2: 90% 93% 93% 93%  Weight:      Height:        Start Time: 0845 hrs. End Time: 0848 hrs. Materials:  Needle(s) Type: Regular needle Gauge: 22G Length: 3.5-in Medication(s): We administered lactated ringers, midazolam, fentaNYL, lidocaine (PF), Sodium Hyaluronate, and ropivacaine (PF) 2 mg/mL (0.2%). Please see  chart orders for dosing details.  Imaging Guidance:  Type of Imaging Technique: None used Indication(s): N/A Exposure Time: No patient exposure Contrast: None used. Fluoroscopic Guidance: N/A Ultrasound Guidance: N/A Interpretation: N/A  Antibiotic Prophylaxis:  Indication(s): None identified Antibiotic given: None  Post-operative Assessment:  EBL: None Complications: No immediate post-treatment complications observed by team, or reported by patient. Note: The patient tolerated the entire procedure well. A repeat set of vitals were taken after the procedure and the patient was kept under observation following institutional policy, for this type of procedure. Post-procedural neurological assessment was performed, showing return to baseline, prior to discharge. The patient was provided with post-procedure discharge instructions, including a section on how to identify potential problems. Should any problems arise concerning this procedure, the patient was given instructions to immediately contact us, at any time, without hesitation. In any case, we plan to contact the patient by telephone for a follow-up status report regarding this interventional procedure. Comments:  No additional relevant information.  Plan of Care  Disposition: Discharge home  Discharge Date & Time: 05/16/2017; 0920 hrs.  Physician-requested Follow-up:  Return for post-procedure eval (in 2 wks), w/ MD.  Future Appointments Date Time Provider [place]  05/17/2017 10:00 AM [facility]  [facility] None  05/24/2017 10:30 AM Gardiner Barefoot, DPM [facility]  06/07/2017 9:00 AM Milinda Pointer, MD [facility] None  07/16/2017 9:00 AM Vevelyn Francois, NP [facility] None  07/19/2017 9:15 AM Caryl Bis Angela Adam, MD [facility] None   Medications ordered for procedure: Meds ordered this encounter  Medications  . lactated ringers infusion 1,000 mL  . midazolam (VERSED) 5 MG/5ML injection 1-2 mg    Make sure Flumazenil is available in the pyxis when using this medication. If oversedation occurs, administer 0.2 mg IV over 15 sec. If after 45 sec no response, administer 0.2 mg again over 1 min; may repeat at 1 min intervals; not to exceed 4 doses (1 mg)  . fentaNYL (SUBLIMAZE) injection 25-50 mcg    Make sure Narcan is available in the pyxis when using this medication. In the event of respiratory depression (RR< 8/min): Titrate NARCAN (naloxone) in increments of 0.1 to 0.2 mg IV at 2-3 minute intervals, until desired degree of reversal.  . lidocaine (PF) (XYLOCAINE) 1 % injection 5 mL  . Sodium Hyaluronate SOSY 2 mL  . ropivacaine (PF) 2 mg/mL (0.2%) (NAROPIN) injection 2 mL   Medications administered: We administered lactated ringers, midazolam, fentaNYL, lidocaine (PF), Sodium Hyaluronate, and ropivacaine (PF) 2 mg/mL (0.2%).  See the medical record for exact dosing, route, and time of administration.  Lab-work, Procedure(s), & Referral(s) Ordered: Orders Placed This Encounter  Procedures  . KNEE INJECTION  . Informed Consent Details: Transcribe to consent form and obtain patient signature  . Provider attestation of informed consent for procedure/surgical case  . Verify informed consent  . Discharge instructions  . Follow-up   Imaging Ordered: Results for orders placed in visit on 01/31/17  DG C-Arm 1-60 Min-No Report   Narrative Fluoroscopy was utilized by the requesting physician.  No radiographic  interpretation.    New Prescriptions   No medications on file    Primary Care Physician: Leone Haven, MD Location: [facility] Note by: Gaspar Cola, MD Date: 05/16/2017; Time: 10:09 AM  Disclaimer:  Medicine is not an Chief Strategy Officer. The only guarantee in medicine is that nothing is guaranteed. It is important to note that the decision to proceed with this intervention was based  on the information collected from the patient. The Data and conclusions were drawn from the patient's questionnaire, the interview, and the physical examination. Because the information was provided in large part by the patient, it cannot be guaranteed that it has not been purposely or unconsciously manipulated. Every effort has been made to obtain as much relevant data as possible for this evaluation. It is important to note that the conclusions that lead to this procedure are derived in large part from the available data. Always take into account that the treatment will also be dependent on availability of resources and existing treatment guidelines, considered by other Pain Management Practitioners as being common knowledge and practice, at the time of the intervention. For Medico-Legal purposes, it is also important to point out that variation in procedural techniques and pharmacological choices are the acceptable norm. The indications, contraindications, technique, and results of the above procedure should only be interpreted and judged by a Board-Certified Interventional Pain Specialist with extensive familiarity and expertise in the same exact procedure and technique.  Instructions provided at this appointment: Patient Instructions   ____________________________________________________________________________________________  Post-Procedure instructions Instructions:  Apply ice: Fill a plastic sandwich bag with crushed ice. Cover it with a small towel and apply to injection site. Apply for 15 minutes then remove x 15 minutes. Repeat  sequence on day of procedure, until you go to bed. The purpose is to minimize swelling and discomfort after procedure.  Apply heat: Apply heat to procedure site starting the day following the procedure. The purpose is to treat any soreness and discomfort from the procedure.  Food intake: Start with clear liquids (like water) and advance to regular food, as tolerated.   Physical activities: Keep activities to a minimum for the first 8 hours after the procedure.   Driving: If you have received any sedation, you are not allowed to drive for 24 hours after your procedure.  Blood thinner: Restart your blood thinner 6 hours after your procedure. (Only for those taking blood thinners)  Insulin: As soon as you can eat, you may resume your normal dosing schedule. (Only for those taking insulin)  Infection prevention: Keep procedure site clean and dry.  Post-procedure Pain Diary: Extremely important that this be done correctly and accurately. Recorded information will be used to determine the next step in treatment.  Pain evaluated is that of treated area only. Do not include pain from an untreated area.  Complete every hour, on the hour, for the initial 8 hours. Set an alarm to help you do this part accurately.  Do not go to sleep and have it completed later. It will not be accurate.  Follow-up appointment: Keep your follow-up appointment after the procedure. Usually 2 weeks for most procedures. (6 weeks in the case of radiofrequency.) Bring you pain diary.  Expect:  From numbing medicine (AKA: Local Anesthetics): Numbness or decrease in pain.  Onset: Full effect within 15 minutes of injected.  Duration: It will depend on the type of local anesthetic used. On the average, 1 to 8 hours.   From steroids: Decrease in swelling or inflammation. Once inflammation is improved, relief of the pain will follow.  Onset of benefits: Depends on the amount of swelling present. The more swelling, the  longer it will take for the benefits to be seen. In some cases, up to 10 days.  Duration: Steroids will stay in the system x 2 weeks. Duration of benefits will depend on multiple posibilities including persistent irritating factors.  From procedure:  Some discomfort is to be expected once the numbing medicine wears off. This should be minimal if ice and heat are applied as instructed. Call if:  You experience numbness and weakness that gets worse with time, as opposed to wearing off.  New onset bowel or bladder incontinence. (Spinal procedures only)  Emergency Numbers:  Durning business hours (Monday - Thursday, 8:00 AM - 4:00 PM) (Friday, 9:00 AM - 12:00 Noon): [phone]  After hours: [phone] ____________________________________________________________________________________________  Pain Management Discharge Instructions  General Discharge Instructions :  If you need to reach your doctor call: Monday-Friday 8:00 am - 4:00 pm at [phone] or toll free [phone].  After clinic hours [phone] to have operator reach doctor.  Bring all of your medication bottles to all your appointments in the pain clinic.  To cancel or reschedule your appointment with Pain Management please remember to call 24 hours in advance to avoid a fee.  Refer to the educational materials which you have been given on: General Risks, I had my Procedure. Discharge Instructions, Post Sedation.  Post Procedure Instructions:  The drugs you were given will stay in your system until tomorrow, so for the next 24 hours you should not drive, make any legal decisions or drink any alcoholic beverages.  You may eat anything you prefer, but it is better to start with liquids then soups and crackers, and gradually work up to solid foods.  Please notify your doctor immediately if you have any unusual bleeding, trouble breathing or pain that is not related to your normal pain.  Depending on the type  of procedure that was done, some parts of your body may feel week and/or numb.  This usually clears up by tonight or the next day.  Walk with the use of an assistive device or accompanied by an adult for the 24 hours.  You may use ice on the affected area for the first 24 hours.  Put ice in a Ziploc bag and cover with a towel and place against area 15 minutes on 15 minutes off.  You may switch to heat after 24 hours. Knee Injection A knee injection is a procedure to get medicine into your knee joint. Your health care provider puts a needle into the joint and injects medicine with an attached syringe. The injected medicine may relieve the pain, swelling, and stiffness of arthritis. The injected medicine may also help to lubricate and cushion your knee joint. You may need more than one injection. Tell a health care provider about:  Any allergies you have.  All medicines you are taking, including vitamins, herbs, eye drops, creams, and over-the-counter medicines.  Any problems you or family members have had with anesthetic medicines.  Any blood disorders you have.  Any surgeries you have had.  Any medical conditions you have. What are the risks? Generally, this is a safe procedure. However, problems may occur, including:  Infection.  Bleeding.  Worsening symptoms.  Damage to the area around your knee.  Allergic reaction to any of the medicines.  Skin reactions from repeated injections.  What happens before the procedure?  Ask your health care provider about changing or stopping your regular medicines. This is especially important if you are taking diabetes medicines or blood thinners.  Plan to have someone take you home after the procedure. What happens during the procedure?  You will sit or lie down in a position for your knee to be treated.  The skin over your kneecap will be cleaned with  a germ-killing solution (antiseptic).  You will be given a medicine that numbs the  area (local anesthetic). You may feel some stinging.  After your knee becomes numb, you will have a second injection. This is the medicine. This needle is carefully placed between your kneecap and your knee. The medicine is injected into the joint space.  At the end of the procedure, the needle will be removed.  A bandage (dressing) may be placed over the injection site. The procedure may vary among health care providers and hospitals. What happens after the procedure?  You may have to move your knee through its full range of motion. This helps to get all of the medicine into your joint space.  Your blood pressure, heart rate, breathing rate, and blood oxygen level will be monitored often until the medicines you were given have worn off.  You will be watched to make sure that you do not have a reaction to the injected medicine. This information is not intended to replace advice given to you by your health care provider. Make sure you discuss any questions you have with your health care provider. Document Released: 01/14/2007 Document Revised: 03/24/2016 Document Reviewed: 09/02/2014 Elsevier Interactive Patient Education  2018 [place]  What are the risk, side effects and possible complications? Generally speaking, most procedures are safe.  However, with any procedure there are risks, side effects, and the possibility of complications.  The risks and complications are dependent upon the sites that are lesioned, or the type of nerve block to be performed.  The closer the procedure is to the spine, the more serious the risks are.  Great care is taken when placing the radio frequency needles, block needles or lesioning probes, but sometimes complications can occur. 1. Infection: Any time there is an injection through the skin, there is a risk of infection.  This is why sterile conditions are used for these blocks.  There are four possible types of  infection. 1. Localized skin infection. 2. Central Nervous System Infection-This can be in the form of Meningitis, which can be deadly. 3. Epidural Infections-This can be in the form of an epidural abscess, which can cause pressure inside of the spine, causing compression of the spinal cord with subsequent paralysis. This would require an emergency surgery to decompress, and there are no guarantees that the patient would recover from the paralysis. 4. Discitis-This is an infection of the intervertebral discs.  It occurs in about 1% of discography procedures.  It is difficult to treat and it may lead to surgery.        2. Pain: the needles have to go through skin and soft tissues, will cause soreness.       3. Damage to internal structures:  The nerves to be lesioned may be near blood vessels or    other nerves which can be potentially damaged.       4. Bleeding: Bleeding is more common if the patient is taking blood thinners such as  aspirin, Coumadin, Ticiid, Plavix, etc., or if he/she have some genetic predisposition  such as hemophilia. Bleeding into the spinal canal can cause compression of the spinal  cord with subsequent paralysis.  This would require an emergency surgery to  decompress and there are no guarantees that the patient would recover from the  paralysis.       5. Pneumothorax:  Puncturing of a lung is a possibility, every time a needle is introduced in  the  area of the chest or upper back.  Pneumothorax refers to free air around the  collapsed lung(s), inside of the thoracic cavity (chest cavity).  Another two possible  complications related to a similar event would include: Hemothorax and Chylothorax.   These are variations of the Pneumothorax, where instead of air around the collapsed  lung(s), you may have blood or chyle, respectively.       6. Spinal headaches: They may occur with any procedures in the area of the spine.       7. Persistent CSF (Cerebro-Spinal Fluid) leakage: This is  a rare problem, but may occur  with prolonged intrathecal or epidural catheters either due to the formation of a fistulous  track or a dural tear.       8. Nerve damage: By working so close to the spinal cord, there is always a possibility of  nerve damage, which could be as serious as a permanent spinal cord injury with  paralysis.       9. Death:  Although rare, severe deadly allergic reactions known as "Anaphylactic  reaction" can occur to any of the medications used.      10. Worsening of the symptoms:  We can always make thing worse.  What are the chances of something like this happening? Chances of any of this occuring are extremely low.  By statistics, you have more of a chance of getting killed in a motor vehicle accident: while driving to the hospital than any of the above occurring .  Nevertheless, you should be aware that they are possibilities.  In general, it is similar to taking a shower.  Everybody knows that you can slip, hit your head and get killed.  Does that mean that you should not shower again?  Nevertheless always keep in mind that statistics do not mean anything if you happen to be on the wrong side of them.  Even if a procedure has a 1 (one) in a 1,000,000 (million) chance of going wrong, it you happen to be that one..Also, keep in mind that by statistics, you have more of a chance of having something go wrong when taking medications.  Who should not have this procedure? If you are on a blood thinning medication (e.g. Coumadin, Plavix, see list of "Blood Thinners"), or if you have an active infection going on, you should not have the procedure.  If you are taking any blood thinners, please inform your physician.  How should I prepare for this procedure?  Do not eat or drink anything at least six hours prior to the procedure.  Bring a driver with you .  It cannot be a taxi.  Come accompanied by an adult that can drive you back, and that is strong enough to help you if your  legs get weak or numb from the local anesthetic.  Take all of your medicines the morning of the procedure with just enough water to swallow them.  If you have diabetes, make sure that you are scheduled to have your procedure done first thing in the morning, whenever possible.  If you have diabetes, take only half of your insulin dose and notify our nurse that you have done so as soon as you arrive at the clinic.  If you are diabetic, but only take blood sugar pills (oral hypoglycemic), then do not take them on the morning of your procedure.  You may take them after you have had the procedure.  Do not take aspirin or any  aspirin-containing medications, at least eleven (11) days prior to the procedure.  They may prolong bleeding.  Wear loose fitting clothing that may be easy to take off and that you would not mind if it got stained with Betadine or blood.  Do not wear any jewelry or perfume  Remove any nail coloring.  It will interfere with some of our monitoring equipment.  NOTE: Remember that this is not meant to be interpreted as a complete list of all possible complications.  Unforeseen problems may occur.  BLOOD THINNERS The following drugs contain aspirin or other products, which can cause increased bleeding during surgery and should not be taken for 2 weeks prior to and 1 week after surgery.  If you should need take something for relief of minor pain, you may take acetaminophen which is found in Tylenol,m Datril, Anacin-3 and Panadol. It is not blood thinner. The products listed below are.  Do not take any of the products listed below in addition to any listed on your instruction sheet.  A.P.C or A.P.C with Codeine Codeine Phosphate Capsules #3 Ibuprofen Ridaura  ABC compound Congesprin Imuran rimadil  Advil Cope Indocin Robaxisal  Alka-Seltzer Effervescent Pain Reliever and Antacid Coricidin or Coricidin-D  Indomethacin Rufen  Alka-Seltzer plus Cold Medicine Cosprin Ketoprofen S-A-C  Tablets  Anacin Analgesic Tablets or Capsules Coumadin Korlgesic Salflex  Anacin Extra Strength Analgesic tablets or capsules CP-2 Tablets Lanoril Salicylate  Anaprox Cuprimine Capsules Levenox Salocol  Anexsia-D Dalteparin Magan Salsalate  Anodynos Darvon compound Magnesium Salicylate Sine-off  Ansaid Dasin Capsules Magsal Sodium Salicylate  Anturane Depen Capsules Marnal Soma  APF Arthritis pain formula Dewitt's Pills Measurin Stanback  Argesic Dia-Gesic Meclofenamic Sulfinpyrazone  Arthritis Bayer Timed Release Aspirin Diclofenac Meclomen Sulindac  Arthritis pain formula Anacin Dicumarol Medipren Supac  Analgesic (Safety coated) Arthralgen Diffunasal Mefanamic Suprofen  Arthritis Strength Bufferin Dihydrocodeine Mepro Compound Suprol  Arthropan liquid Dopirydamole Methcarbomol with Aspirin Synalgos  ASA tablets/Enseals Disalcid Micrainin Tagament  Ascriptin Doan's Midol Talwin  Ascriptin A/D Dolene Mobidin Tanderil  Ascriptin Extra Strength Dolobid Moblgesic Ticlid  Ascriptin with Codeine Doloprin or Doloprin with Codeine Momentum Tolectin  Asperbuf Duoprin Mono-gesic Trendar  Aspergum Duradyne Motrin or Motrin IB Triminicin  Aspirin plain, buffered or enteric coated Durasal Myochrisine Trigesic  Aspirin Suppositories Easprin Nalfon Trillsate  Aspirin with Codeine Ecotrin Regular or Extra Strength Naprosyn Uracel  Atromid-S Efficin Naproxen Ursinus  Auranofin Capsules Elmiron Neocylate Vanquish  Axotal Emagrin Norgesic Verin  Azathioprine Empirin or Empirin with Codeine Normiflo Vitamin E  Azolid Emprazil Nuprin Voltaren  Bayer Aspirin plain, buffered or children's or timed BC Tablets or powders Encaprin Orgaran Warfarin Sodium  Buff-a-Comp Enoxaparin Orudis Zorpin  Buff-a-Comp with Codeine Equegesic Os-Cal-Gesic   Buffaprin Excedrin plain, buffered or Extra Strength Oxalid   Bufferin Arthritis Strength Feldene Oxphenbutazone   Bufferin plain or Extra Strength Feldene Capsules  Oxycodone with Aspirin   Bufferin with Codeine Fenoprofen Fenoprofen Pabalate or Pabalate-SF   Buffets II Flogesic Panagesic   Buffinol plain or Extra Strength Florinal or Florinal with Codeine Panwarfarin   Buf-Tabs Flurbiprofen Penicillamine   Butalbital Compound Four-way cold tablets Penicillin   Butazolidin Fragmin Pepto-Bismol   Carbenicillin Geminisyn Percodan   Carna Arthritis Reliever Geopen Persantine   Carprofen Gold's salt Persistin   Chloramphenicol Goody's Phenylbutazone   Chloromycetin Haltrain Piroxlcam   Clmetidine heparin Plaquenil   Cllnoril Hyco-pap Ponstel   Clofibrate Hydroxy chloroquine Propoxyphen         Before stopping any of these medications, be sure  to consult the physician who ordered them.  Some, such as Coumadin (Warfarin) are ordered to prevent or treat serious conditions such as "deep thrombosis", "pumonary embolisms", and other heart problems.  The amount of time that you may need off of the medication may also vary with the medication and the reason for which you were taking it.  If you are taking any of these medications, please make sure you notify your pain physician before you undergo any procedures.         Pain Management Discharge Instructions  General Discharge Instructions :  If you need to reach your doctor call: Monday-Friday 8:00 am - 4:00 pm at [phone] or toll free [phone].  After clinic hours [phone] to have operator reach doctor.  Bring all of your medication bottles to all your appointments in the pain clinic.  To cancel or reschedule your appointment with Pain Management please remember to call 24 hours in advance to avoid a fee.  Refer to the educational materials which you have been given on: General Risks, I had my Procedure. Discharge Instructions, Post Sedation.  Post Procedure Instructions:  The drugs you were given will stay in your system until tomorrow, so for the next 24 hours you should not drive,  make any legal decisions or drink any alcoholic beverages.  You may eat anything you prefer, but it is better to start with liquids then soups and crackers, and gradually work up to solid foods.  Please notify your doctor immediately if you have any unusual bleeding, trouble breathing or pain that is not related to your normal pain.  Depending on the type of procedure that was done, some parts of your body may feel week and/or numb.  This usually clears up by tonight or the next day.  Walk with the use of an assistive device or accompanied by an adult for the 24 hours.  You may use ice on the affected area for the first 24 hours.  Put ice in a Ziploc bag and cover with a towel and place against area 15 minutes on 15 minutes off.  You may switch to heat after 24 hours.

## 2017-05-16 NOTE — Telephone Encounter (Signed)
I am unsure what lab work she needs. I do not see where we have discussed her returning for lab work. Can you see if you can figure this out and if not can you contact her to see what she needs. Thanks.

## 2017-05-17 ENCOUNTER — Other Ambulatory Visit: Payer: BLUE CROSS/BLUE SHIELD

## 2017-05-17 ENCOUNTER — Ambulatory Visit (INDEPENDENT_AMBULATORY_CARE_PROVIDER_SITE_OTHER): Payer: BLUE CROSS/BLUE SHIELD | Admitting: Family Medicine

## 2017-05-17 ENCOUNTER — Telehealth: Payer: Self-pay | Admitting: *Deleted

## 2017-05-17 ENCOUNTER — Telehealth: Payer: Self-pay

## 2017-05-17 ENCOUNTER — Encounter: Payer: Self-pay | Admitting: Family Medicine

## 2017-05-17 VITALS — BP 110/68 | HR 72 | Temp 98.2°F | Wt 169.4 lb

## 2017-05-17 DIAGNOSIS — E663 Overweight: Secondary | ICD-10-CM | POA: Diagnosis not present

## 2017-05-17 DIAGNOSIS — F419 Anxiety disorder, unspecified: Secondary | ICD-10-CM | POA: Diagnosis not present

## 2017-05-17 DIAGNOSIS — F32A Depression, unspecified: Secondary | ICD-10-CM

## 2017-05-17 DIAGNOSIS — F329 Major depressive disorder, single episode, unspecified: Secondary | ICD-10-CM | POA: Diagnosis not present

## 2017-05-17 NOTE — Patient Instructions (Signed)
Nice to see you. We'll try to refer you to medical weight loss program. If you do not hear anything regarding this in the next week please call us. Please continue to work on your diet. Please start exercising and try to get 150 minutes a week. Please monitor your anxiety and depression of this worsens please let us know.

## 2017-05-17 NOTE — Telephone Encounter (Signed)
Denies any needs at this time. Instructed to call if needed. 

## 2017-05-17 NOTE — Progress Notes (Addendum)
  Nicole Rumps, MD Phone: [phone]  Nicole Walter is a 56 y.o. female who presents today for follow-up.  Overweight: Patient has been working diligently at her diet. Drinking more water. Eating more fruit and vegetables. Low-calorie snacks. Grilled chicken. Decreasing her portion sizes. Occasional soda. Not many fried or fatty foods. She had been exercising well up until a few months ago. She's going to start going back to the gym. She's been walking some.  Anxiety/depression: Notes this is somewhat better. She is seeing psychiatry. They're trying a middle dose of trintellix. No SI or HI.  ROS see history of present illness  Objective  Physical Exam Vitals:   05/17/17 1052  BP: 110/68  Pulse: 72  Temp: 98.2 F (36.8 C)    BP Readings from Last 3 Encounters:  05/17/17 110/68  05/16/17 127/66  05/15/17 128/73   Wt Readings from Last 3 Encounters:  05/17/17 169 lb 6.4 oz (76.8 kg)  05/16/17 170 lb (77.1 kg)  05/15/17 170 lb (77.1 kg)    Physical Exam  Constitutional: No distress.  Cardiovascular: Normal rate, regular rhythm and normal heart sounds.   Pulmonary/Chest: Effort normal and breath sounds normal.  Skin: She is not diaphoretic.     Assessment/Plan: Please see individual problem list.  Overweight (BMI 25.0-29.9) Discussed continued dietary changes. Advised on exercise. Discussed that medication may be of benefit though I would not be prescribing phentermine given her psychiatric issues. Could consider Saxenda. She is somewhat unsure. Discussed referral to medical weight management and a referral was placed.  Anxiety and depression Relatively stable. Occasionally has some days the are worse than others. No SI or HI. Continue to follow with psychiatry.  Addendum: It appears the wait time for the medical weight management clinic is too long. The patient and I did discuss the risk of thyroid cancer with Saxenda. She has no family history of medullary  thyroid cancer or MEN2 type cancers. We will prescribe Saxenda.  Orders Placed This Encounter  Procedures  . Amb Ref to Medical Weight Management    Referral Priority:   Routine    Referral Type:   Consultation    Number of Visits Requested:   [place], MD [place]

## 2017-05-17 NOTE — Telephone Encounter (Signed)
Please advise 

## 2017-05-17 NOTE — Telephone Encounter (Signed)
Patient was advised to attend the weight loss program, however the program is booked up for 3 months. Pt has requested to have the alternative route suggested by Dr. Caryl Bis referring to a diabetic injection.  Pt contact [phone]

## 2017-05-17 NOTE — Assessment & Plan Note (Signed)
Discussed continued dietary changes. Advised on exercise. Discussed that medication may be of benefit though I would not be prescribing phentermine given her psychiatric issues. Could consider Saxenda. She is somewhat unsure. Discussed referral to medical weight management and a referral was placed.

## 2017-05-17 NOTE — Assessment & Plan Note (Signed)
Relatively stable. Occasionally has some days the are worse than others. No SI or HI. Continue to follow with psychiatry.

## 2017-05-18 ENCOUNTER — Ambulatory Visit: Payer: BLUE CROSS/BLUE SHIELD | Admitting: Family Medicine

## 2017-05-18 ENCOUNTER — Telehealth: Payer: Self-pay | Admitting: Family Medicine

## 2017-05-18 MED ORDER — LIRAGLUTIDE -WEIGHT MANAGEMENT 18 MG/3ML ~~LOC~~ SOPN: 0.6000 mg | PEN_INJECTOR | Freq: Every day | SUBCUTANEOUS | 1 refills | Status: DC

## 2017-05-18 MED ORDER — INSULIN PEN NEEDLE 32G X 6 MM MISC: 0 refills | Status: DC

## 2017-05-18 NOTE — Telephone Encounter (Signed)
Please let her know we will send this in today.

## 2017-05-18 NOTE — Telephone Encounter (Signed)
faxed

## 2017-05-18 NOTE — Telephone Encounter (Signed)
Please fax to pharmacy. If patient has nausea with this she should let us know.

## 2017-05-18 NOTE — Telephone Encounter (Signed)
Patient notified and scheduled for 1 month follow up

## 2017-05-18 NOTE — Telephone Encounter (Signed)
Please let the patient know that I got a response from the cardiologist. It appears that they did not believe that she needed a follow-up echo based on her prior echo unless she is having symptoms. If she is having breathing issues, chest pain she should contact the cardiologist to see if they would like to schedule her for the echo. Thanks.

## 2017-05-18 NOTE — Telephone Encounter (Signed)
Pt called today at 1:16 pm. She is still waiting for a response. Please advise.

## 2017-05-18 NOTE — Telephone Encounter (Signed)
-----   Message from Wellington Hampshire, MD sent at 05/18/2017  5:14 PM EDT ----- Nicole Walter, A trivial pericardial effusion does not require a follow-up study unless she is having symptoms.  MA  ----- Message ----- From: Leone Haven, MD Sent: 05/17/2017  12:19 PM To: Wellington Hampshire, MD  Hi Dr Fletcher Anon,   I saw this patient today and she was previously seeing Dr Yvone Neu before she left. The patient mentioned that she was to have a repeat echo sometime between August and October for trivial pericardial fluid seen on prior echo. She just wanted me to check with you all to make sure this was in place. Please let me know if you need anything from me regarding this.   Randall Hiss

## 2017-05-18 NOTE — Telephone Encounter (Signed)
Please advise 

## 2017-05-21 NOTE — Telephone Encounter (Signed)
Patient notified and will contact cardiology

## 2017-05-21 NOTE — Progress Notes (Signed)
Results were reviewed and found to be: mildly abnormal  No acute injury or pathology identified  Review would suggest interventional pain management techniques may be of benefit 

## 2017-05-22 ENCOUNTER — Telehealth: Payer: Self-pay

## 2017-05-22 ENCOUNTER — Telehealth: Payer: Self-pay | Admitting: *Deleted

## 2017-05-22 NOTE — Telephone Encounter (Signed)
Spoke with patient and reviewed that we have her scheduled for repeat echocardiogram 06/08/17 at 1:00 pm and then follow up with Dr. Fletcher Anon on 06/21/17 at 08:00 am. Reviewed previous echocardiogram results with her and answered her questions in detail. She was appreciative for the call and reports that she spoke with nurse yesterday. Confirmed upcoming appointments with her and let her know that we would call with results once available. She verbalized understanding of our conversation, agreement with plan, and had no further questions at this time.

## 2017-05-22 NOTE — Telephone Encounter (Signed)
-----   Message from Wellington Hampshire, MD sent at 05/18/2017  5:14 PM EDT ----- [place], A trivial pericardial effusion does not require a follow-up study unless she is having symptoms.  MA  ----- Message ----- From: Leone Haven, MD Sent: 05/17/2017  12:19 PM To: Wellington Hampshire, MD  Hi Dr Fletcher Anon,   I saw this patient today and she was previously seeing Dr Yvone Neu before she left. The patient mentioned that she was to have a repeat echo sometime between August and October for trivial pericardial fluid seen on prior echo. She just wanted me to check with you all to make sure this was in place. Please let me know if you need anything from me regarding this.   Randall Hiss

## 2017-05-22 NOTE — Telephone Encounter (Signed)
PA for Saxenda started on cover my meds

## 2017-05-24 ENCOUNTER — Telehealth: Payer: Self-pay

## 2017-05-24 ENCOUNTER — Encounter: Payer: Self-pay | Admitting: Podiatry

## 2017-05-24 ENCOUNTER — Ambulatory Visit (INDEPENDENT_AMBULATORY_CARE_PROVIDER_SITE_OTHER): Payer: BLUE CROSS/BLUE SHIELD | Admitting: Podiatry

## 2017-05-24 ENCOUNTER — Telehealth: Payer: Self-pay | Admitting: Family Medicine

## 2017-05-24 VITALS — BP 140/76 | HR 64

## 2017-05-24 DIAGNOSIS — M79676 Pain in unspecified toe(s): Secondary | ICD-10-CM

## 2017-05-24 DIAGNOSIS — B351 Tinea unguium: Secondary | ICD-10-CM

## 2017-05-24 DIAGNOSIS — L608 Other nail disorders: Secondary | ICD-10-CM

## 2017-05-24 NOTE — Telephone Encounter (Signed)
Please advise, denial on your desk

## 2017-05-24 NOTE — Progress Notes (Signed)
   Subjective:    Patient ID: Nicole Walter, female    DOB: May 03, 1961, 56 y.o.   MRN: [id number]  HPI this patient presents the office with chief complaint of severe pain noted in second toenails, both feet.  Patient states that the nails are growing into the corners and are difficult for her to trim.  She says these 2 toes in addition to the fourth toe left foot are painful walking and wearing her shoes.  She says she has difficulty trimming the nails and the pain appears to be worsening.  She presents the office today to discuss further treatment of this condition.  She also relates having sharp, radiating pain from the back of her left ankle as well as numbness on the outside of her left ankle since previous ankle surgery.      Review of Systems  Respiratory: Positive for shortness of breath.   Cardiovascular: Positive for chest pain.  Gastrointestinal: Positive for abdominal pain and constipation.       Bloating  Musculoskeletal: Positive for back pain and gait problem.  Skin: Positive for color change.       Objective:   Physical Exam GENERAL APPEARANCE: Alert, conversant. Appropriately groomed. No acute distress.  VASCULAR: Pedal pulses are  palpable at  [facility] and PT bilateral.  Capillary refill time is immediate to all digits,  Normal temperature gradient.  Digital hair growth is present bilateral  NEUROLOGIC: sensation is normal to 5.07 monofilament at 5/5 sites bilateral.  Light touch is intact bilateral, Muscle strength normal.  MUSCULOSKELETAL: acceptable muscle strength, tone and stability bilateral.  Intrinsic muscluature intact bilateral.  Rectus appearance of foot and digits noted bilateral.  NAILS  thick disfigured discolored. Pincer toenails 24 of the left foot and 2 of the right foot. No evidence of any drainage or bacterial infection noted DERMATOLOGIC: skin color, texture, and turgor are within normal limits.  No preulcerative lesions or ulcers  are seen, no interdigital  maceration noted.  No open lesions present.   No drainage noted.         Assessment & Plan:  Onychomycosis  B/L  Pincer nails.   IE  . Examination of her foot revealed pincer toenails and we discussed conservative versus surgical treatment.  Patient desires to be treated surgically with the permanent removal of the offending toenails bilateral.  Patient to make an appointment and schedule her surgery in the near future.  RTC prn   Gardiner Barefoot DPM

## 2017-05-24 NOTE — Telephone Encounter (Signed)
Already sent to PCP. thanks

## 2017-05-24 NOTE — Telephone Encounter (Signed)
Patient walked up to clionic window and wanted to speak to me.  States she is having neck and shoulder blade pain and would like to get an injection.  Spoke to Dr Dossie Arbour and he states he could do a trigger point and then reevaluate if needed.  Patient notified of appointment and pre procedure instructions given.

## 2017-05-24 NOTE — Telephone Encounter (Signed)
PA for Saxenda denied, doesn't meet the definition of medical necessity found in members booklet.  No alternatives have been tried, Belviq or Qsymia.

## 2017-05-24 NOTE — Telephone Encounter (Signed)
Nicole Walter from [place] called and they are requesting a non formulary for pt's saxenda in order to be covered. Please advise, thank you!

## 2017-05-28 ENCOUNTER — Encounter: Payer: Self-pay | Admitting: Pain Medicine

## 2017-05-28 ENCOUNTER — Ambulatory Visit: Payer: BLUE CROSS/BLUE SHIELD | Attending: Pain Medicine | Admitting: Pain Medicine

## 2017-05-28 VITALS — BP 143/65 | HR 62 | Temp 98.1°F | Resp 16 | Ht 64.0 in | Wt 170.0 lb

## 2017-05-28 DIAGNOSIS — M47812 Spondylosis without myelopathy or radiculopathy, cervical region: Secondary | ICD-10-CM | POA: Diagnosis not present

## 2017-05-28 DIAGNOSIS — M791 Myalgia: Secondary | ICD-10-CM | POA: Diagnosis not present

## 2017-05-28 DIAGNOSIS — G8929 Other chronic pain: Secondary | ICD-10-CM

## 2017-05-28 DIAGNOSIS — M542 Cervicalgia: Secondary | ICD-10-CM | POA: Diagnosis not present

## 2017-05-28 DIAGNOSIS — M4692 Unspecified inflammatory spondylopathy, cervical region: Secondary | ICD-10-CM | POA: Insufficient documentation

## 2017-05-28 DIAGNOSIS — M7918 Myalgia, other site: Secondary | ICD-10-CM | POA: Insufficient documentation

## 2017-05-28 MED ORDER — TRIAMCINOLONE ACETONIDE 40 MG/ML IJ SUSP
40.0000 mg | Freq: Once | INTRAMUSCULAR | Status: AC
  Administered 2017-05-28: 40 mg
  Filled 2017-05-28: qty 1

## 2017-05-28 MED ORDER — ROPIVACAINE HCL 2 MG/ML IJ SOLN
9.0000 mL | Freq: Once | INTRAMUSCULAR | Status: AC
  Administered 2017-05-28: 9 mL
  Filled 2017-05-28: qty 10

## 2017-05-28 MED ORDER — LIDOCAINE HCL (PF) 1.5 % IJ SOLN
20.0000 mL | Freq: Once | INTRAMUSCULAR | Status: DC
  Filled 2017-05-28: qty 20

## 2017-05-28 NOTE — Telephone Encounter (Signed)
Please advise, appeal is on your desk

## 2017-05-28 NOTE — Telephone Encounter (Signed)
Pt called to follow up on the PA for the Saxenda. Please advise?  Call pt @ [phone]. Thank you!

## 2017-05-28 NOTE — Telephone Encounter (Signed)
Notes printed and form for appeal faxed, thanks

## 2017-05-28 NOTE — Telephone Encounter (Signed)
Please advise, needs to have tried Belviq or Qsymia or I need rationale for appeal, thanks

## 2017-05-28 NOTE — Telephone Encounter (Signed)
See additional telephone note, thanks

## 2017-05-28 NOTE — Telephone Encounter (Signed)
See other phone note

## 2017-05-28 NOTE — Progress Notes (Signed)
Safety precautions to be maintained throughout the outpatient stay will include: orient to surroundings, keep bed in low position, maintain call bell within reach at all times, provide assistance with transfer out of bed and ambulation.  

## 2017-05-28 NOTE — Telephone Encounter (Signed)
Neither of those options are good for the patient given her psychiatric issues and the other medications that she is on. Please try this as part of the rationale for appeal. Thanks.

## 2017-05-28 NOTE — Patient Instructions (Signed)

## 2017-05-28 NOTE — Progress Notes (Signed)
Patient's Name: Nicole Walter  MRN: [id number]  Referring Provider: Leone Haven, MD  DOB: 03-Apr-1961  PCP: Leone Haven, MD  DOS: 05/28/2017  Note by: Gaspar Cola, MD  Service setting: Ambulatory outpatient  Specialty: Interventional Pain Management  Patient type: Established  Location: [facility] (AMB) Pain Management Facility  Visit type: Interventional Procedure   Primary Reason for Visit: Interventional Pain Management Treatment. CC: Neck Pain  Procedure:  Anesthesia, Analgesia, Anxiolysis:  Type: Trigger Point Injection (1-2 muscle groups)(trapezius m.) CPT: 20552 Region:Posterior Cervicothoracic Level: Cervico-thoracic Laterality: Left-Sided Paraspinal  Type: Local Anesthesia Local Anesthetic: Lidocaine 1% Route: Infiltration ([place]/IM) IV Access: Declined Sedation: Declined  Indication(s): Analgesia          Indications: 1. Myofascial pain syndrome, cervical (trapezius) (Left)   2. Chronic neck pain (Location of Primary Source of Pain) (Bilateral) (L>R)   3. Cervical facet syndrome (Bilateral) (L>R)   4. Cervical spondylosis (Bilateral)    Pain Score: Pre-procedure: 1 /10 Post-procedure: 0-No pain/10  Pre-op Assessment:  Previous date of service: 05/16/17 Service provided: Procedure Nicole Walter is a 56 y.o. (year old), female patient, seen today for interventional treatment. She  has a past surgical history that includes Cholecystectomy; Tubal ligation; Ankle surgery; Nasal sinus surgery; Tonsillectomy; Colonoscopy; Esophagogastroduodenoscopy (egd) with propofol (N/A, 02/22/2016); Colonoscopy with propofol (N/A, 02/22/2016); and Breast biopsy (Left, 1997). Nicole Walter has a current medication list which includes the following prescription(s): calcium carbonate-vit d-min, vitamin d, fluocinonide ointment, fluticasone, gabapentin, hydroxyzine, multi-vitamins, oxycodone, oxycodone, rabeprazole, simvastatin, sucralfate, trazodone, and trintellix, and the following  Facility-Administered Medications: lidocaine. Her primarily concern today is the Neck Pain  The patient presents with significant pain over the left lateral aspect of the trapezius muscle with a palpable trigger point and reproduction of her usual pain by applying pressure to it. In addition, the patient has restriction of motion especially rotation of her head towards the left side although her right side rotation is also limited. The patient also percent with lateral bending that its limited towards the left side and when she turns towards the right side it pulls on the left. X-rays recently done identified and the area of the C5-6 disc as having degeneration that seems to be more significant than in the rest of the cervical spine. An MRI of the cervical spine done on 2017 revealed a bulging disc at the C5-6 level with bilateral cervical facet degeneration (L>R) and bilateral foraminal stenosis at that level also (L>R). However, she does not present with any pain down either upper extremity. To rule out the possibility that her pain is secondary to the trigger point, today we will go ahead and injected and we will have her return in 2 weeks for follow-up. If the pain continues, then we will plan on doing a diagnostic left sided, versus bilateral cervical facet block under fluoroscopic guidance and IV sedation. The patient indicates that she is also having some increase in her hip pain and would like to have that treated after her neck.  Initial Vital Signs: Blood pressure (!) 143/65, pulse 62, temperature 98.1 F (36.7 C), resp. rate 16, height 5\' 4"  (1.626 m), weight 170 lb (77.1 kg), SpO2 98 %. BMI: Estimated body mass index is 29.18 kg/m as calculated from the following:   Height as of this encounter: 5\' 4"  (1.626 m).   Weight as of this encounter: 170 lb (77.1 kg).  Risk Assessment: Allergies: Reviewed. She is allergic to other and food.  Allergy Precautions: None required Coagulopathies:  Reviewed. None identified.  Blood-thinner therapy: None at this time Active Infection(s): Reviewed. None identified. Nicole Walter is afebrile  Site Confirmation: Nicole Walter was asked to confirm the procedure and laterality before marking the site Procedure checklist: Completed Consent: Before the procedure and under the influence of no sedative(s), amnesic(s), or anxiolytics, the patient was informed of the treatment options, risks and possible complications. To fulfill our ethical and legal obligations, as recommended by the American Medical Association's Code of Ethics, I have informed the patient of my clinical impression; the nature and purpose of the treatment or procedure; the risks, benefits, and possible complications of the intervention; the alternatives, including doing nothing; the risk(s) and benefit(s) of the alternative treatment(s) or procedure(s); and the risk(s) and benefit(s) of doing nothing. The patient was provided information about the general risks and possible complications associated with the procedure. These may include, but are not limited to: failure to achieve desired goals, infection, bleeding, organ or nerve damage, allergic reactions, paralysis, and death. In addition, the patient was informed of those risks and complications associated to the procedure, such as failure to decrease pain; infection; bleeding; organ or nerve damage with subsequent damage to sensory, motor, and/or autonomic systems, resulting in permanent pain, numbness, and/or weakness of one or several areas of the body; allergic reactions; (i.e.: anaphylactic reaction); and/or death. Furthermore, the patient was informed of those risks and complications associated with the medications. These include, but are not limited to: allergic reactions (i.e.: anaphylactic or anaphylactoid reaction(s)); adrenal axis suppression; blood sugar elevation that in diabetics may result in ketoacidosis or comma; water retention  that in patients with history of congestive heart failure may result in shortness of breath, pulmonary edema, and decompensation with resultant heart failure; weight gain; swelling or edema; medication-induced neural toxicity; particulate matter embolism and blood vessel occlusion with resultant organ, and/or nervous system infarction; and/or aseptic necrosis of one or more joints. Finally, the patient was informed that Medicine is not an exact science; therefore, there is also the possibility of unforeseen or unpredictable risks and/or possible complications that may result in a catastrophic outcome. The patient indicated having understood very clearly. We have given the patient no guarantees and we have made no promises. Enough time was given to the patient to ask questions, all of which were answered to the patient's satisfaction. Ms. Decarolis has indicated that she wanted to continue with the procedure. Attestation: I, the ordering provider, attest that I have discussed with the patient the benefits, risks, side-effects, alternatives, likelihood of achieving goals, and potential problems during recovery for the procedure that I have provided informed consent. Date: 05/28/2017; Time: 9:44 AM  Pre-Procedure Preparation:  Monitoring: As per clinic protocol. Respiration, ETCO2, SpO2, BP, heart rate and rhythm monitor placed and checked for adequate function Safety Precautions: Patient was assessed for positional comfort and pressure points before starting the procedure. Time-out: I initiated and conducted the "Time-out" before starting the procedure, as per protocol. The patient was asked to participate by confirming the accuracy of the "Time Out" information. Verification of the correct person, site, and procedure were performed and confirmed by me, the nursing staff, and the patient. "Time-out" conducted as per Joint Commission's Universal Protocol (UP.01.01.01). "Time-out" Date & Time: 05/28/2017; 1002  hrs.  Description of Procedure Process:   Position: Prone Target Area: Trigger Point Approach: Ipsilateral approach. Area Prepped: Entire Posterior Cervicothoracic Region Prepping solution: ChloraPrep (2% chlorhexidine gluconate and 70% isopropyl alcohol) Safety Precautions: Aspiration looking for blood return was conducted  prior to all injections. At no point did we inject any substances, as a needle was being advanced. No attempts were made at seeking any paresthesias. Safe injection practices and needle disposal techniques used. Medications properly checked for expiration dates. SDV (single dose vial) medications used. Description of the Procedure: Protocol guidelines were followed. The patient was placed in position over the fluoroscopy table. The target area was identified and the area prepped in the usual manner. Skin desensitized using vapocoolant spray. Skin & deeper tissues infiltrated with local anesthetic. Appropriate amount of time allowed to pass for local anesthetics to take effect. The procedure needles were then advanced to the target area. Proper needle placement secured. Negative aspiration confirmed. Solution injected in intermittent fashion, asking for systemic symptoms every 0.5cc of injectate. The needles were then removed and the area cleansed, making sure to leave some of the prepping solution back to take advantage of its long term bactericidal properties. Vitals:   05/28/17 0928  BP: (!) 143/65  Pulse: 62  Resp: 16  Temp: 98.1 F (36.7 C)  SpO2: 98%  Weight: 170 lb (77.1 kg)  Height: 5\' 4"  (1.626 m)    Start Time: 1002 hrs. End Time: 1006 hrs. Materials:  Needle(s) Type: Epidural needle Gauge: 25G Length: 1.5-in Medication(s): We administered ropivacaine (PF) 2 mg/mL (0.2%) and triamcinolone acetonide. Please see chart orders for dosing details.  Imaging Guidance:  Type of Imaging Technique: None used Indication(s): N/A Exposure Time: No patient  exposure Contrast: None used. Fluoroscopic Guidance: N/A Ultrasound Guidance: N/A Interpretation: N/A  Antibiotic Prophylaxis:  Indication(s): None identified Antibiotic given: None  Post-operative Assessment:  EBL: None Complications: No immediate post-treatment complications observed by team, or reported by patient. Note: The patient tolerated the entire procedure well. A repeat set of vitals were taken after the procedure and the patient was kept under observation following institutional policy, for this type of procedure. Post-procedural neurological assessment was performed, showing return to baseline, prior to discharge. The patient was provided with post-procedure discharge instructions, including a section on how to identify potential problems. Should any problems arise concerning this procedure, the patient was given instructions to immediately contact us, at any time, without hesitation. In any case, we plan to contact the patient by telephone for a follow-up status report regarding this interventional procedure. Comments:  No additional relevant information.  Plan of Care  Disposition: Discharge home  Discharge Date & Time: 05/28/2017; 1008 hrs.  Physician-requested Follow-up:  Return for post-procedure eval (in 2 wks).  Future Appointments Date Time Provider [place]  06/06/2017 8:15 AM Milinda Pointer, MD [facility] None  06/08/2017 1:00 PM [facility] [place] [facility]  06/11/2017 1:15 PM Gardiner Barefoot, DPM [facility]  06/14/2017 2:15 PM Milinda Pointer, MD [facility] None  06/18/2017 11:15 AM Leone Haven, MD [facility] None  06/18/2017 2:15 PM Gardiner Barefoot, DPM [facility]  06/21/2017 8:00 AM Wellington Hampshire, MD [facility]  07/16/2017 9:00 AM Vevelyn Francois, NP [facility] None  07/19/2017 9:15 AM Leone Haven, MD [facility] None   Medications ordered for procedure: Meds ordered this encounter  Medications  .  lidocaine 1.5 % injection 20 mL    From block tray  . ropivacaine (PF) 2 mg/mL (0.2%) (NAROPIN) injection 9 mL  . triamcinolone acetonide (KENALOG-40) injection 40 mg   Medications administered: We administered ropivacaine (PF) 2 mg/mL (0.2%) and triamcinolone acetonide.  See the medical record for exact dosing, route, and time of administration.  Lab-work, Procedure(s), &  Referral(s) Ordered: Orders Placed This Encounter  Procedures  . TRIGGER POINT INJECTION  . Informed Consent Details: Transcribe to consent form and obtain patient signature  . Provider attestation of informed consent for procedure/surgical case  . Verify informed consent  . Discharge instructions  . Follow-up   Imaging Ordered: Results for orders placed in visit on 01/31/17  DG C-Arm 1-60 Min-No Report   Narrative Fluoroscopy was utilized by the requesting physician.  No radiographic  interpretation.    New Prescriptions   No medications on file   Primary Care Physician: Leone Haven, MD Location: [facility] Note by: Gaspar Cola, MD Date: 05/28/2017; Time: 10:22 AM  Disclaimer:  Medicine is not an exact science. The only guarantee in medicine is that nothing is guaranteed. It is important to note that the decision to proceed with this intervention was based on the information collected from the patient. The Data and conclusions were drawn from the patient's questionnaire, the interview, and the physical examination. Because the information was provided in large part by the patient, it cannot be guaranteed that it has not been purposely or unconsciously manipulated. Every effort has been made to obtain as much relevant data as possible for this evaluation. It is important to note that the conclusions that lead to this procedure are derived in large part from the available data. Always take into account that the treatment will also be dependent on availability of resources  and existing treatment guidelines, considered by other Pain Management Practitioners as being common knowledge and practice, at the time of the intervention. For Medico-Legal purposes, it is also important to point out that variation in procedural techniques and pharmacological choices are the acceptable norm. The indications, contraindications, technique, and results of the above procedure should only be interpreted and judged by a Board-Certified Interventional Pain Specialist with extensive familiarity and expertise in the same exact procedure and technique.  Instructions provided at this appointment: Patient Instructions  ____________________________________________________________________________________________  Post-Procedure instructions Instructions:  Apply ice: Fill a plastic sandwich bag with crushed ice. Cover it with a small towel and apply to injection site. Apply for 15 minutes then remove x 15 minutes. Repeat sequence on day of procedure, until you go to bed. The purpose is to minimize swelling and discomfort after procedure.  Apply heat: Apply heat to procedure site starting the day following the procedure. The purpose is to treat any soreness and discomfort from the procedure.  Food intake: Start with clear liquids (like water) and advance to regular food, as tolerated.   Physical activities: Keep activities to a minimum for the first 8 hours after the procedure.   Driving: If you have received any sedation, you are not allowed to drive for 24 hours after your procedure.  Blood thinner: Restart your blood thinner 6 hours after your procedure. (Only for those taking blood thinners)  Insulin: As soon as you can eat, you may resume your normal dosing schedule. (Only for those taking insulin)  Infection prevention: Keep procedure site clean and dry.  Post-procedure Pain Diary: Extremely important that this be done correctly and accurately. Recorded information will be used to  determine the next step in treatment.  Pain evaluated is that of treated area only. Do not include pain from an untreated area.  Complete every hour, on the hour, for the initial 8 hours. Set an alarm to help you do this part accurately.  Do not go to sleep and have it completed later.  It will not be accurate.  Follow-up appointment: Keep your follow-up appointment after the procedure. Usually 2 weeks for most procedures. (6 weeks in the case of radiofrequency.) Bring you pain diary.  Expect:  From numbing medicine (AKA: Local Anesthetics): Numbness or decrease in pain.  Onset: Full effect within 15 minutes of injected.  Duration: It will depend on the type of local anesthetic used. On the average, 1 to 8 hours.   From steroids: Decrease in swelling or inflammation. Once inflammation is improved, relief of the pain will follow.  Onset of benefits: Depends on the amount of swelling present. The more swelling, the longer it will take for the benefits to be seen. In some cases, up to 10 days.  Duration: Steroids will stay in the system x 2 weeks. Duration of benefits will depend on multiple posibilities including persistent irritating factors.  From procedure: Some discomfort is to be expected once the numbing medicine wears off. This should be minimal if ice and heat are applied as instructed. Call if:  You experience numbness and weakness that gets worse with time, as opposed to wearing off.  New onset bowel or bladder incontinence. (Spinal procedures only)  Emergency Numbers:  [place] business hours (Monday - Thursday, 8:00 AM - 4:00 PM) (Friday, 9:00 AM - 12:00 Noon): [phone]  After hours: [phone] ____________________________________________________________________________________________

## 2017-05-29 ENCOUNTER — Telehealth: Payer: Self-pay | Admitting: *Deleted

## 2017-05-29 NOTE — Telephone Encounter (Signed)
No problems post procedure. 

## 2017-06-01 NOTE — Telephone Encounter (Signed)
Nicole Walter from [place] called and wanted to know about the quantity. She satted that if it was for the same quantity that it would be denied but if it was for 5 they could get it approved. Please advise, thank you!  Call Nicole Walter @ [phone]

## 2017-06-01 NOTE — Telephone Encounter (Signed)
Spoke with Janett Billow at [facility] patient will be approvd for 5 pens which is enough for the prescribed dosage, thanks it will be approval under a new case number, thanks

## 2017-06-04 NOTE — Telephone Encounter (Signed)
Spoke with Janett Billow at [place], to get clarity, the BMI of 27-29, is on file, issue is that she doesn't have other weight related issues, I.e. HTN and all that , so if her BMI was greater then 30  They don't care about the risk factors.  FYI, a fax should be received stating this also, thanks

## 2017-06-04 NOTE — Telephone Encounter (Signed)
Does hyperlipidemia count is a risk factor? She does not have some of the other weight related issues though she is on a cholesterol medication and her cholesterol was quite high prior to being on this medication.

## 2017-06-04 NOTE — Telephone Encounter (Signed)
[employer] called and stated that the Saxenda was denied because pt's BMI was not 27 or more. We will be receiving a fax.

## 2017-06-05 NOTE — Telephone Encounter (Signed)
I will have to see what is on the fax when it comes in to see exact diagnosis that will work, thanks

## 2017-06-06 ENCOUNTER — Ambulatory Visit: Payer: BLUE CROSS/BLUE SHIELD | Admitting: Pain Medicine

## 2017-06-07 ENCOUNTER — Ambulatory Visit: Payer: BLUE CROSS/BLUE SHIELD | Admitting: Pain Medicine

## 2017-06-07 NOTE — Telephone Encounter (Signed)
Denial states medication not on formulary and is covered when a member has an initial body mass index of 30 or more, or an initial BMI of 27 or more with another weight-related issues (Such as high blood pressure , high cholesterol, etc.) are present.   I can attempt a appeal if you would like.  Please advise, thanks

## 2017-06-07 NOTE — Telephone Encounter (Signed)
Do we have a paper with this denial?

## 2017-06-07 NOTE — Telephone Encounter (Signed)
Pt called back looking for an update. Please advise, thank you!  Call pt @ [phone]

## 2017-06-07 NOTE — Telephone Encounter (Signed)
Please advise 

## 2017-06-07 NOTE — Telephone Encounter (Signed)
Denial placed on your desk

## 2017-06-08 ENCOUNTER — Other Ambulatory Visit: Payer: BLUE CROSS/BLUE SHIELD

## 2017-06-08 NOTE — Telephone Encounter (Signed)
She does have history of elevated cholesterol. She is on cholesterol medication. You can attempt an appeal.

## 2017-06-11 ENCOUNTER — Ambulatory Visit: Payer: BLUE CROSS/BLUE SHIELD | Admitting: Podiatry

## 2017-06-11 NOTE — Telephone Encounter (Signed)
Sent appeal on cover my meds

## 2017-06-14 ENCOUNTER — Ambulatory Visit: Payer: BLUE CROSS/BLUE SHIELD | Admitting: Pain Medicine

## 2017-06-15 NOTE — Telephone Encounter (Signed)
notified from [place] that appeal was approved, from 05/28/2017-10/01/2017 Reference number [facility].  Notified the Patient, thanks

## 2017-06-18 ENCOUNTER — Ambulatory Visit (INDEPENDENT_AMBULATORY_CARE_PROVIDER_SITE_OTHER): Payer: BLUE CROSS/BLUE SHIELD | Admitting: Podiatry

## 2017-06-18 ENCOUNTER — Telehealth: Payer: Self-pay | Admitting: *Deleted

## 2017-06-18 ENCOUNTER — Ambulatory Visit: Payer: BLUE CROSS/BLUE SHIELD | Admitting: Family Medicine

## 2017-06-18 DIAGNOSIS — M79676 Pain in unspecified toe(s): Secondary | ICD-10-CM

## 2017-06-18 DIAGNOSIS — L608 Other nail disorders: Secondary | ICD-10-CM | POA: Diagnosis not present

## 2017-06-18 DIAGNOSIS — B351 Tinea unguium: Secondary | ICD-10-CM

## 2017-06-18 NOTE — Telephone Encounter (Signed)
Yes I was in to see Dr Prudence Davidson earlier to have my toe nails removed the one next to my big to is bleeding through but the other is not.  Can you call me.  Patient did not leave a number.  Attempted to return the patients phone call.  Her husband answered and stated she had gone to her mothers and would not be reachable tonight he would have her call back tomorrow.

## 2017-06-18 NOTE — Progress Notes (Signed)
This patient returns to the office follow-up for scheduled surgery for the removal of the toenails on the second and fourth toes of the left foot.  These nails are mycotic in nature and she has pincer toenails. Also , she presents the office today for her scheduled nail surgery left foot.  Complaint:  Visit Type: Patient returns to my office for continued preventative foot care services. Complaint: Patient states" my nails have grown long and thick and become painful to walk and wear shoes" Patient has been diagnosed with DM with no foot complications. The patient presents for preventative foot care services. No changes to ROS  Podiatric Exam: Vascular: dorsalis pedis and posterior tibial pulses are palpable bilateral. Capillary return is immediate. Temperature gradient is WNL. Skin turgor WNL  Sensorium: Normal Semmes Weinstein monofilament test. Normal tactile sensation bilaterally. Nail Exam: Pt has thick disfigured discolored pincer toenails  2,4 left foot. Ulcer Exam: There is no evidence of ulcer or pre-ulcerative changes or infection. Orthopedic Exam: Muscle tone and strength are WNL. No limitations in general ROM. No crepitus or effusions noted. Foot type and digits show no abnormalities. Bony prominences are unremarkable. Skin: No Porokeratosis. No infection or ulcers  Diagnosis:  Onychomycosis, , Pain in right toe, pain in left toes  Pincer nails    Treatment & Plan Procedures and Treatment: Consent by patient was obtained for treatment procedures. The patient understood the discussion of treatment and procedures well. Nail surgery left foot.  Treatment options and alternatives discussed.  Recommended permanent phenol matrixectomy and patient agreed. Second and fourth toes left foot wereprepped with alcohol and a toe block of 3cc of 2% lidocaine plain was administered in a digital toe block. .  The toe was then prepped with betadine solution .  The offending nails were  then excised and  matrix tissue exposed.  Phenol was then applied to the matrix tissue followed by an alcohol wash.  Antibiotic ointment and a dry sterile dressing was applied.  The patient was dispensed instructions for aftercare. RTC 1 week.     Gardiner Barefoot DPM .  Return Visit-Office Procedure: Patient instructed to return to the office for a follow up visit 3 months for continued evaluation and treatment.    Gardiner Barefoot DPM.

## 2017-06-20 ENCOUNTER — Ambulatory Visit (INDEPENDENT_AMBULATORY_CARE_PROVIDER_SITE_OTHER): Payer: BLUE CROSS/BLUE SHIELD

## 2017-06-20 ENCOUNTER — Other Ambulatory Visit: Payer: Self-pay

## 2017-06-20 DIAGNOSIS — I3139 Other pericardial effusion (noninflammatory): Secondary | ICD-10-CM

## 2017-06-20 DIAGNOSIS — I313 Pericardial effusion (noninflammatory): Secondary | ICD-10-CM

## 2017-06-21 ENCOUNTER — Encounter: Payer: Self-pay | Admitting: Podiatry

## 2017-06-21 ENCOUNTER — Ambulatory Visit (INDEPENDENT_AMBULATORY_CARE_PROVIDER_SITE_OTHER): Payer: BLUE CROSS/BLUE SHIELD | Admitting: Cardiovascular Disease

## 2017-06-21 ENCOUNTER — Encounter: Payer: Self-pay | Admitting: Cardiovascular Disease

## 2017-06-21 ENCOUNTER — Ambulatory Visit (INDEPENDENT_AMBULATORY_CARE_PROVIDER_SITE_OTHER): Payer: Self-pay | Admitting: Podiatry

## 2017-06-21 VITALS — BP 110/66 | HR 62 | Ht 64.0 in | Wt 164.5 lb

## 2017-06-21 DIAGNOSIS — E785 Hyperlipidemia, unspecified: Secondary | ICD-10-CM | POA: Diagnosis not present

## 2017-06-21 DIAGNOSIS — Z09 Encounter for follow-up examination after completed treatment for conditions other than malignant neoplasm: Secondary | ICD-10-CM

## 2017-06-21 DIAGNOSIS — R079 Chest pain, unspecified: Secondary | ICD-10-CM

## 2017-06-21 NOTE — Patient Instructions (Signed)
Medication Instructions: No changes .   Labwork: None.   Procedures/Testing: None.   Follow-Up: As needed.   Any Additional Special Instructions Will Be Listed Below (If Applicable).     If you need a refill on your cardiac medications before your next appointment, please call your pharmacy.

## 2017-06-21 NOTE — Progress Notes (Signed)
This patient presents the office follow-up for nail surgery for the removal of the pincer toenails on the second and fourth digits of the left foot.  She says she is not having any pain or discomfort, but she has noticed that she believes nail is growing at the surgical sites.  She presents the office today for an evaluation of the surgical sites.  Neurovascular status is the same as on 06/18/2017.  Surgical sites, do reveal red granulation tissue noted on the nail beds with minimal necrotic tissue noted around the grooves.  No evidence of any redness, swelling or pain.    Post-op nail surgery  ROV.  Debride necrotic tissue at the nail sites.  Neosporin dry sterile dressing.  Patient to return the office in one week for reevaluation of the surgical sites.   Gardiner Barefoot DPM

## 2017-06-21 NOTE — Progress Notes (Signed)
Cardiology Office Note   Date:  06/21/2017   ID:  Nicole Walter, DOB 10-15-1961, MRN [id number]  PCP:  Leone Haven, MD  Cardiologist:   Kathlyn Sacramento, MD   Chief Complaint  Patient presents with  . other    follow up from repeat eacho. Patient c/o chest pain and SOB off and on. Meds reviewed verbally with patient.       History of Present Illness: Nicole Walter is a 56 y.o. female who presents for a follow-up visit regarding chest pain. She was seen by Dr. Yvone Neu in March for atypical chest pain. She has known history of hyperlipidemia and family history of coronary artery disease. She underwent a pharmacologic nuclear stress test which was normal. Echocardiogram showed normal LV systolic function and no significant valvular abnormalities. There was trivial pericardial effusion. A repeat echocardiogram yesterday showed no pericardial effusion. The chest pain seems to be pleuritic with cough and deep breath. She had CT scan done in June which showed no significant abnormalities. The coronary arteries appeared without calcifications.   Past Medical History:  Diagnosis Date  . Abnormal liver enzymes 02/08/2016  . Allergy   . Anxiety   . Chest pain 07/05/2016  . Chronic pain   . Depression   . DJD (degenerative joint disease)    lumbar  . Fibromyalgia   . High cholesterol   . Rash of back 01/28/2016    Past Surgical History:  Procedure Laterality Date  . ANKLE SURGERY     x 2  . BREAST BIOPSY Left 1997  . CHOLECYSTECTOMY    . COLONOSCOPY    . COLONOSCOPY WITH PROPOFOL N/A 02/22/2016   Procedure: COLONOSCOPY WITH PROPOFOL;  Surgeon: Lollie Sails, MD;  Location: [facility] ENDOSCOPY;  Service: Endoscopy;  Laterality: N/A;  . ESOPHAGOGASTRODUODENOSCOPY (EGD) WITH PROPOFOL N/A 02/22/2016   Procedure: ESOPHAGOGASTRODUODENOSCOPY (EGD) WITH PROPOFOL;  Surgeon: Lollie Sails, MD;  Location: [facility] ENDOSCOPY;  Service: Endoscopy;  Laterality: N/A;  . NASAL SINUS SURGERY      . TONSILLECTOMY    . TUBAL LIGATION       Current Outpatient Prescriptions  Medication Sig Dispense Refill  . Calcium Carbonate-Vit D-Min (CALCIUM 1200 PO) Take by mouth.    . Cholecalciferol (VITAMIN D) 2000 units tablet Take 4,000 Units by mouth daily.    . fluocinonide ointment (LIDEX) 0.05 % APPLY TOPICALLY TWICE A DAY TO ITCHY AREAS ONLY. as needed  0  . fluticasone (FLONASE) 50 MCG/ACT nasal spray instill 2 sprays into each nostril once daily 16 g 11  . hydrOXYzine (VISTARIL) 25 MG capsule Take 25 mg by mouth 2 (two) times daily as needed.  1  . lamoTRIgine (LAMICTAL) 25 MG tablet take 1 tablet by mouth once daily for 2 weeks then INCREASE TO 2 ...  (REFER TO PRESCRIPTION NOTES).  0  . Multiple Vitamin (MULTI-VITAMINS) TABS Take 1 tablet by mouth daily.     Marland Kitchen oxyCODONE (OXY IR/ROXICODONE) 5 MG immediate release tablet Take 1 tablet (5 mg total) by mouth every 6 (six) hours as needed for severe pain. 120 tablet 0  . [START ON 07/12/2017] oxyCODONE (OXY IR/ROXICODONE) 5 MG immediate release tablet Take 1 tablet (5 mg total) by mouth every 6 (six) hours as needed for severe pain. 120 tablet 0  . RABEprazole (ACIPHEX) 20 MG tablet Take 20 mg by mouth as needed.     Marland Kitchen SAXENDA 18 MG/3ML SOPN inject 0.6 milligram subcutaneously once daily for 1 week then 1....  (  REFER TO PRESCRIPTION NOTES).  0  . simvastatin (ZOCOR) 20 MG tablet take 1 tablet by mouth at bedtime 90 tablet 1  . sucralfate (CARAFATE) 1 g tablet take 1 tablet by mouth three times a day if needed for abdominal pain  0  . traZODone (DESYREL) 100 MG tablet take 1 tablet by mouth at bedtime if needed  0  . TRINTELLIX 10 MG TABS 15 mg.   0   No current facility-administered medications for this visit.     Allergies:   Other and Food    Social History:  The patient  reports that she quit smoking about 1 years ago. She started smoking about 43 years ago. She has a 35.00 pack-year smoking history. She has never used smokeless  tobacco. She reports that she does not drink alcohol or use drugs.   Family History:  The patient's family history includes Arthritis in her father, paternal grandfather, and paternal grandmother; Cancer in her brother and father; Dementia in her sister; Diabetes in her sister; Drug abuse in her brother, brother, and sister; Heart disease in her mother; Post-traumatic stress disorder in her brother; Stroke in her mother.    ROS:  Please see the history of present illness.   Otherwise, review of systems are positive for none.   All other systems are reviewed and negative.    PHYSICAL EXAM: VS:  BP 110/66 (BP Location: Left Arm, Patient Position: Sitting, Cuff Size: Normal)   Pulse 62   Ht 5\' 4"  (1.626 m)   Wt 164 lb 8 oz (74.6 kg)   BMI 28.24 kg/m  , BMI Body mass index is 28.24 kg/m. GEN: Well nourished, well developed, in no acute distress  HEENT: normal  Neck: no JVD, carotid bruits, or masses Cardiac: RRR; no murmurs, rubs, or gallops,no edema  Respiratory:  clear to auscultation bilaterally, normal work of breathing GI: soft, nontender, nondistended, + BS MS: no deformity or atrophy  Skin: warm and dry, no rash Neuro:  Strength and sensation are intact Psych: euthymic mood, full affect   EKG:  EKG is ordered today. The ekg ordered today demonstrates  normal sinus rhythm with nonspecific T wave changes.   Recent Labs: 01/15/2017: BUN 13; Creat 0.89; Hemoglobin 12.4; Platelets 300; Potassium 4.3; Sodium 141; TSH 1.30 01/19/2017: ALT 45 05/15/2017: Magnesium 2.2    Lipid Panel    Component Value Date/Time   CHOL 183 05/30/2016 1316   TRIG 107.0 05/30/2016 1316   HDL 64.30 05/30/2016 1316   CHOLHDL 3 05/30/2016 1316   VLDL 21.4 05/30/2016 1316   LDLCALC 98 05/30/2016 1316   LDLDIRECT 97.0 10/17/2016 1537      Wt Readings from Last 3 Encounters:  06/21/17 164 lb 8 oz (74.6 kg)  05/28/17 170 lb (77.1 kg)  05/17/17 169 lb 6.4 oz (76.8 kg)       PAD Screen  01/24/2017  Previous PAD dx? No  Previous surgical procedure? No  Pain with walking? Yes  Subsides with rest? No  Feet/toe relief with dangling? No  Painful, non-healing ulcers? No  Extremities discolored? No      ASSESSMENT AND PLAN:  1.  Atypical noncardiac chest pain: Negative cardiac workup including nuclear stress test and echocardiogram. There was trivial pericardial effusion which was likely physiologic. Repeat echo yesterday showed no pericardial effusion.  2. Hyperlipidemia: Currently on simvastatin.  Disposition:   FU with cardiology as needed.  Signed,  Kathlyn Sacramento, MD  06/21/2017 8:44 AM    [facility]

## 2017-06-28 ENCOUNTER — Encounter: Payer: Self-pay | Admitting: Podiatry

## 2017-06-28 ENCOUNTER — Ambulatory Visit (INDEPENDENT_AMBULATORY_CARE_PROVIDER_SITE_OTHER): Payer: BLUE CROSS/BLUE SHIELD | Admitting: Podiatry

## 2017-06-28 DIAGNOSIS — Z09 Encounter for follow-up examination after completed treatment for conditions other than malignant neoplasm: Secondary | ICD-10-CM

## 2017-06-28 DIAGNOSIS — M79676 Pain in unspecified toe(s): Secondary | ICD-10-CM

## 2017-06-28 DIAGNOSIS — B351 Tinea unguium: Secondary | ICD-10-CM

## 2017-06-28 DIAGNOSIS — L608 Other nail disorders: Secondary | ICD-10-CM

## 2017-06-28 NOTE — Progress Notes (Signed)
This patient presents the office follow-up for nail surgery for the removal of the pincer toenails on the second and fourth digits of the left foot.  She says she is not having any pain or discomfort, but she has noticed that she believes nail is growing at the surgical sites.  She presents the office today for an evaluation of the surgical sites.  Neurovascular status is the same as on 06/18/2017.  Surgical sites, do reveal red granulation tissue noted on the nail beds with minimal necrotic tissue noted around the grooves.  No evidence of any redness, swelling or pain.    Post-op nail surgery  ROV.  Debride necrotic tissue at the nail sites.  Neosporin dry sterile dressing.  Patient to return the office in one week for reevaluation of the surgical sites. Plan to remove toenails right foot.   Gardiner Barefoot DPM

## 2017-06-29 ENCOUNTER — Telehealth: Payer: Self-pay | Admitting: Family Medicine

## 2017-06-29 NOTE — Telephone Encounter (Signed)
Pt called and wanted to let Dr. Caryl Bis know how she is doing on the shot, Saxana. Please call her at [phone].

## 2017-06-29 NOTE — Telephone Encounter (Signed)
Left message to return call 

## 2017-06-29 NOTE — Telephone Encounter (Signed)
Patient states she started the saxenda and states she does not have an appetite but also does not have energy, patient is taking this at night and would like to know if she should take this in the morning instead

## 2017-07-01 NOTE — Telephone Encounter (Signed)
She could try switching it to the morning to see if that makes a difference. Please find out when she started on it and then we can schedule her follow-up for reevaluation.

## 2017-07-02 NOTE — Telephone Encounter (Signed)
Noted. Please confirm the patient's dosing schedule for this medication with her. We'll plan on seeing her in September as well. Thanks.

## 2017-07-02 NOTE — Telephone Encounter (Signed)
Left message to return call 

## 2017-07-02 NOTE — Telephone Encounter (Signed)
Patient notified and states she started it two weeks ago, she is scheduled for follow up on 07/19/17

## 2017-07-03 NOTE — Telephone Encounter (Signed)
Patient is taking 1.8 and it is week 3, then it will be 2.4 and then it will be 3.0

## 2017-07-03 NOTE — Telephone Encounter (Signed)
Noted. Thanks for confirming.

## 2017-07-12 ENCOUNTER — Encounter: Payer: Self-pay | Admitting: Podiatry

## 2017-07-12 ENCOUNTER — Ambulatory Visit (INDEPENDENT_AMBULATORY_CARE_PROVIDER_SITE_OTHER): Payer: BLUE CROSS/BLUE SHIELD | Admitting: Podiatry

## 2017-07-12 DIAGNOSIS — L608 Other nail disorders: Secondary | ICD-10-CM

## 2017-07-12 DIAGNOSIS — M79676 Pain in unspecified toe(s): Secondary | ICD-10-CM | POA: Diagnosis not present

## 2017-07-12 DIAGNOSIS — B351 Tinea unguium: Secondary | ICD-10-CM

## 2017-07-12 NOTE — Progress Notes (Signed)
This patient presents the office for reevaluation of the toes of both feet.  She previously had nail surgery for the permanent  removal of the second and fourth nail plate on the left foot.  She says that it is healing well. She is not having pain or discomfort.  She presents the office today for scheduled nail plate removal of the second and fourth toes on the right foot.  These nails are thick and disfigured and have developed into pincer toenails which are painful.     GENERAL APPEARANCE: Alert, conversant. Appropriately groomed. No acute distress.  VASCULAR: Pedal pulses are  palpable at  [facility] and PT bilateral.  Capillary refill time is immediate to all digits,  Normal temperature gradient.   NEUROLOGIC: sensation is normal to 5.07 monofilament at 5/5 sites bilateral.  Light touch is intact bilateral, Muscle strength normal.  MUSCULOSKELETAL: acceptable muscle strength, tone and stability bilateral.  Intrinsic muscluature intact bilateral.  Rectus appearance of foot and digits noted bilateral.   DERMATOLOGIC: skin color, texture, and turgor are within normal limits.  No preulcerative lesions or ulcers  are seen, no interdigital maceration noted.  No open lesions present.  . No drainage noted. Healing is noted at the surgical sites of the second and fourth toe of the left foot.  Thick disfigured pincer toenails, second and fourth of the right foot   Diagnosis  Onychomycosis  2,4 right  S/P nail surgery 2,4 left foot.  ROV  Nail surgery.  Treatment options and alternatives discussed.  Recommended permanent phenol matrixectomy and patient agreed. Second and fourth toes right foot was prepped with alcohol and a toe block of 3cc of 2% lidocaine plain was administered in a digital toe block. .  The toe was then prepped with betadine solution .  The offending nail border was then excised and matrix tissue exposed.  Phenol was then applied to the matrix tissue followed by an alcohol wash.  Antibiotic ointment  and a dry sterile dressing was applied.  The patient was dispensed instructions for aftercare. RTC 1 week.     Gardiner Barefoot DPM

## 2017-07-16 ENCOUNTER — Telehealth: Payer: Self-pay | Admitting: *Deleted

## 2017-07-16 ENCOUNTER — Encounter: Payer: Self-pay | Admitting: Nurse Practitioner

## 2017-07-16 ENCOUNTER — Ambulatory Visit: Payer: BLUE CROSS/BLUE SHIELD | Attending: Nurse Practitioner | Admitting: Nurse Practitioner

## 2017-07-16 VITALS — BP 121/72 | HR 77 | Temp 98.0°F | Resp 16 | Ht 64.0 in | Wt 158.0 lb

## 2017-07-16 DIAGNOSIS — F329 Major depressive disorder, single episode, unspecified: Secondary | ICD-10-CM | POA: Diagnosis not present

## 2017-07-16 DIAGNOSIS — G894 Chronic pain syndrome: Secondary | ICD-10-CM | POA: Diagnosis not present

## 2017-07-16 DIAGNOSIS — E7801 Familial hypercholesterolemia: Secondary | ICD-10-CM | POA: Insufficient documentation

## 2017-07-16 DIAGNOSIS — M47892 Other spondylosis, cervical region: Secondary | ICD-10-CM | POA: Insufficient documentation

## 2017-07-16 DIAGNOSIS — M4692 Unspecified inflammatory spondylopathy, cervical region: Secondary | ICD-10-CM | POA: Diagnosis not present

## 2017-07-16 DIAGNOSIS — M8580 Other specified disorders of bone density and structure, unspecified site: Secondary | ICD-10-CM | POA: Insufficient documentation

## 2017-07-16 DIAGNOSIS — R109 Unspecified abdominal pain: Secondary | ICD-10-CM | POA: Insufficient documentation

## 2017-07-16 DIAGNOSIS — M25561 Pain in right knee: Secondary | ICD-10-CM | POA: Diagnosis not present

## 2017-07-16 DIAGNOSIS — F419 Anxiety disorder, unspecified: Secondary | ICD-10-CM | POA: Diagnosis not present

## 2017-07-16 DIAGNOSIS — M1610 Unilateral primary osteoarthritis, unspecified hip: Secondary | ICD-10-CM | POA: Diagnosis not present

## 2017-07-16 DIAGNOSIS — Z87891 Personal history of nicotine dependence: Secondary | ICD-10-CM | POA: Insufficient documentation

## 2017-07-16 DIAGNOSIS — Z5181 Encounter for therapeutic drug level monitoring: Secondary | ICD-10-CM | POA: Insufficient documentation

## 2017-07-16 DIAGNOSIS — Z79891 Long term (current) use of opiate analgesic: Secondary | ICD-10-CM | POA: Diagnosis not present

## 2017-07-16 DIAGNOSIS — M542 Cervicalgia: Secondary | ICD-10-CM | POA: Diagnosis not present

## 2017-07-16 DIAGNOSIS — K5900 Constipation, unspecified: Secondary | ICD-10-CM | POA: Diagnosis not present

## 2017-07-16 DIAGNOSIS — Z79899 Other long term (current) drug therapy: Secondary | ICD-10-CM | POA: Diagnosis not present

## 2017-07-16 DIAGNOSIS — M47812 Spondylosis without myelopathy or radiculopathy, cervical region: Secondary | ICD-10-CM

## 2017-07-16 DIAGNOSIS — M25562 Pain in left knee: Secondary | ICD-10-CM | POA: Diagnosis not present

## 2017-07-16 DIAGNOSIS — M19011 Primary osteoarthritis, right shoulder: Secondary | ICD-10-CM | POA: Diagnosis not present

## 2017-07-16 DIAGNOSIS — G8929 Other chronic pain: Secondary | ICD-10-CM

## 2017-07-16 DIAGNOSIS — M797 Fibromyalgia: Secondary | ICD-10-CM | POA: Diagnosis not present

## 2017-07-16 MED ORDER — OXYCODONE HCL 5 MG PO TABS: 5.0000 mg | ORAL_TABLET | Freq: Four times a day (QID) | ORAL | 0 refills | Status: DC | PRN

## 2017-07-16 MED ORDER — LUBIPROSTONE 24 MCG PO CAPS: 24.0000 ug | ORAL_CAPSULE | Freq: Two times a day (BID) | ORAL | 0 refills | Status: AC

## 2017-07-16 NOTE — Progress Notes (Signed)
Nursing Pain Medication Assessment:  Safety precautions to be maintained throughout the outpatient stay will include: orient to surroundings, keep bed in low position, maintain call bell within reach at all times, provide assistance with transfer out of bed and ambulation.  Medication Inspection Compliance: Pill count conducted under aseptic conditions, in front of the patient. Neither the pills nor the bottle was removed from the patient's sight at any time. Once count was completed pills were immediately returned to the patient in their original bottle.  Medication: See above Pill/Patch Count: 108 of 120 pills remain Pill/Patch Appearance: Markings consistent with prescribed medication Bottle Appearance: Standard pharmacy container. Clearly labeled. Filled Date: 09 / 09 / 2018 Last Medication intake:  Today

## 2017-07-16 NOTE — Patient Instructions (Addendum)
____________________________________________________________________________________________  Medication Rules  Applies to: All patients receiving prescriptions (written or electronic).  Pharmacy of record: Pharmacy where electronic prescriptions will be sent. If written prescriptions are taken to a different pharmacy, please inform the nursing staff. The pharmacy listed in the electronic medical record should be the one where you would like electronic prescriptions to be sent.  Prescription refills: Only during scheduled appointments. Applies to both, written and electronic prescriptions.  NOTE: The following applies primarily to controlled substances (Opioid* Pain Medications).   Patient's responsibilities: 1. Pain Pills: Bring all pain pills to every appointment (except for procedure appointments). 2. Pill Bottles: Bring pills in original pharmacy bottle. Always bring newest bottle. Bring bottle, even if empty. 3. Medication refills: You are responsible for knowing and keeping track of what medications you need refilled. The day before your appointment, write a list of all prescriptions that need to be refilled. Bring that list to your appointment and give it to the admitting nurse. Prescriptions will be written only during appointments. If you forget a medication, it will not be "Called in", "Faxed", or "electronically sent". You will need to get another appointment to get these prescribed. 4. Prescription Accuracy: You are responsible for carefully inspecting your prescriptions before leaving our office. Have the discharge nurse carefully go over each prescription with you, before taking them home. Make sure that your name is accurately spelled, that your address is correct. Check the name and dose of your medication to make sure it is accurate. Check the number of pills, and the written instructions to make sure they are clear and accurate. Make sure that you are given enough medication to  last until your next medication refill appointment. 5. Taking Medication: Take medication as prescribed. Never take more pills than instructed. Never take medication more frequently than prescribed. Taking less pills or less frequently is permitted and encouraged, when it comes to controlled substances (written prescriptions).  6. Inform other Doctors: Always inform, all of your healthcare providers, of all the medications you take. 7. Pain Medication from other Providers: You are not allowed to accept any additional pain medication from any other Doctor or Healthcare provider. There are two exceptions to this rule. (see below) In the event that you require additional pain medication, you are responsible for notifying us, as stated below. 8. Medication Agreement: You are responsible for carefully reading and following our Medication Agreement. This must be signed before receiving any prescriptions from our practice. Safely store a copy of your signed Agreement. Violations to the Agreement will result in no further prescriptions. (Additional copies of our Medication Agreement are available upon request.) 9. Laws, Rules, & Regulations: All patients are expected to follow all Federal and State Laws, Statutes, Rules, & Regulations. Ignorance of the Laws does not constitute a valid excuse. The use of any illegal substances is prohibited. 10. Adopted CDC guidelines & recommendations: Target dosing levels will be at or below 60 MME/day. Use of benzodiazepines** is not recommended.  Exceptions: There are only two exceptions to the rule of not receiving pain medications from other Healthcare Providers. 1. Exception #1 (Emergencies): In the event of an emergency (i.e.: accident requiring emergency care), you are allowed to receive additional pain medication. However, you are responsible for: As soon as you are able, call our office [phone], at any time of the day or night, and leave a message stating your  name, the date and nature of the emergency, and the name and dose of the medication   prescribed. In the event that your call is answered by a member of our staff, make sure to document and save the date, time, and the name of the person that took your information.  2. Exception #2 (Planned Surgery): In the event that you are scheduled by another doctor or dentist to have any type of surgery or procedure, you are allowed (for a period no longer than 30 days), to receive additional pain medication, for the acute post-op pain. However, in this case, you are responsible for picking up a copy of our "Post-op Pain Management for Surgeons" handout, and giving it to your surgeon or dentist. This document is available at our office, and does not require an appointment to obtain it. Simply go to our office during business hours (Monday-Thursday from 8:00 AM to 4:00 PM) (Friday 8:00 AM to 12:00 Noon) or if you have a scheduled appointment with [place], prior to your surgery, and ask for it by name. In addition, you will need to provide [place] with your name, name of your surgeon, type of surgery, and date of procedure or surgery.  *Opioid medications include: morphine, codeine, oxycodone, oxymorphone, hydrocodone, hydromorphone, meperidine, tramadol, tapentadol, buprenorphine, fentanyl, methadone. **Benzodiazepine medications include: diazepam (Valium), alprazolam (Xanax), clonazepam (Klonopine), lorazepam (Ativan), clorazepate (Tranxene), chlordiazepoxide (Librium), estazolam (Prosom), oxazepam (Serax), temazepam (Restoril), triazolam (Halcion)  ____________________________________________________________________________________________  BMI Assessment: Estimated body mass index is 27.12 kg/m as calculated from the following:   Height as of this encounter: 5\' 4"  (1.626 m).   Weight as of this encounter: 158 lb (71.7 kg).  BMI interpretation table: BMI level Category Range association with higher incidence of chronic pain   <18 kg/m2 Underweight   18.5-24.9 kg/m2 Ideal body weight   25-29.9 kg/m2 Overweight Increased incidence by 20%  30-34.9 kg/m2 Obese (Class I) Increased incidence by 68%  35-39.9 kg/m2 Severe obesity (Class II) Increased incidence by 136%  >40 kg/m2 Extreme obesity (Class III) Increased incidence by 254%   BMI Readings from Last 4 Encounters:  07/16/17 27.12 kg/m  06/21/17 28.24 kg/m  05/28/17 29.18 kg/m  05/17/17 29.08 kg/m   Wt Readings from Last 4 Encounters:  07/16/17 158 lb (71.7 kg)  06/21/17 164 lb 8 oz (74.6 kg)  05/28/17 170 lb (77.1 kg)  05/17/17 169 lb 6.4 oz (76.8 kg)  GENERAL RISKS AND COMPLICATIONS  What are the risk, side effects and possible complications? Generally speaking, most procedures are safe.  However, with any procedure there are risks, side effects, and the possibility of complications.  The risks and complications are dependent upon the sites that are lesioned, or the type of nerve block to be performed.  The closer the procedure is to the spine, the more serious the risks are.  Great care is taken when placing the radio frequency needles, block needles or lesioning probes, but sometimes complications can occur. 1. Infection: Any time there is an injection through the skin, there is a risk of infection.  This is why sterile conditions are used for these blocks.  There are four possible types of infection. 1. Localized skin infection. 2. Central Nervous System Infection-This can be in the form of Meningitis, which can be deadly. 3. Epidural Infections-This can be in the form of an epidural abscess, which can cause pressure inside of the spine, causing compression of the spinal cord with subsequent paralysis. This would require an emergency surgery to decompress, and there are no guarantees that the patient would recover from the paralysis. 4. Discitis-This is an infection  of the intervertebral discs.  It occurs in about 1% of discography procedures.  It is  difficult to treat and it may lead to surgery.        2. Pain: the needles have to go through skin and soft tissues, will cause soreness.       3. Damage to internal structures:  The nerves to be lesioned may be near blood vessels or    other nerves which can be potentially damaged.       4. Bleeding: Bleeding is more common if the patient is taking blood thinners such as  aspirin, Coumadin, Ticiid, Plavix, etc., or if he/she have some genetic predisposition  such as hemophilia. Bleeding into the spinal canal can cause compression of the spinal  cord with subsequent paralysis.  This would require an emergency surgery to  decompress and there are no guarantees that the patient would recover from the  paralysis.       5. Pneumothorax:  Puncturing of a lung is a possibility, every time a needle is introduced in  the area of the chest or upper back.  Pneumothorax refers to free air around the  collapsed lung(s), inside of the thoracic cavity (chest cavity).  Another two possible  complications related to a similar event would include: Hemothorax and Chylothorax.   These are variations of the Pneumothorax, where instead of air around the collapsed  lung(s), you may have blood or chyle, respectively.       6. Spinal headaches: They may occur with any procedures in the area of the spine.       7. Persistent CSF (Cerebro-Spinal Fluid) leakage: This is a rare problem, but may occur  with prolonged intrathecal or epidural catheters either due to the formation of a fistulous  track or a dural tear.       8. Nerve damage: By working so close to the spinal cord, there is always a possibility of  nerve damage, which could be as serious as a permanent spinal cord injury with  paralysis.       9. Death:  Although rare, severe deadly allergic reactions known as "Anaphylactic  reaction" can occur to any of the medications used.      10. Worsening of the symptoms:  We can always make thing worse.  What are the chances of  something like this happening? Chances of any of this occuring are extremely low.  By statistics, you have more of a chance of getting killed in a motor vehicle accident: while driving to the hospital than any of the above occurring .  Nevertheless, you should be aware that they are possibilities.  In general, it is similar to taking a shower.  Everybody knows that you can slip, hit your head and get killed.  Does that mean that you should not shower again?  Nevertheless always keep in mind that statistics do not mean anything if you happen to be on the wrong side of them.  Even if a procedure has a 1 (one) in a 1,000,000 (million) chance of going wrong, it you happen to be that one..Also, keep in mind that by statistics, you have more of a chance of having something go wrong when taking medications.  Who should not have this procedure? If you are on a blood thinning medication (e.g. Coumadin, Plavix, see list of "Blood Thinners"), or if you have an active infection going on, you should not have the procedure.  If you are taking any blood  thinners, please inform your physician.  How should I prepare for this procedure?  Do not eat or drink anything at least six hours prior to the procedure.  Bring a driver with you .  It cannot be a taxi.  Come accompanied by an adult that can drive you back, and that is strong enough to help you if your legs get weak or numb from the local anesthetic.  Take all of your medicines the morning of the procedure with just enough water to swallow them.  If you have diabetes, make sure that you are scheduled to have your procedure done first thing in the morning, whenever possible.  If you have diabetes, take only half of your insulin dose and notify our nurse that you have done so as soon as you arrive at the clinic.  If you are diabetic, but only take blood sugar pills (oral hypoglycemic), then do not take them on the morning of your procedure.  You may take them  after you have had the procedure.  Do not take aspirin or any aspirin-containing medications, at least eleven (11) days prior to the procedure.  They may prolong bleeding.  Wear loose fitting clothing that may be easy to take off and that you would not mind if it got stained with Betadine or blood.  Do not wear any jewelry or perfume  Remove any nail coloring.  It will interfere with some of our monitoring equipment.  NOTE: Remember that this is not meant to be interpreted as a complete list of all possible complications.  Unforeseen problems may occur.  BLOOD THINNERS The following drugs contain aspirin or other products, which can cause increased bleeding during surgery and should not be taken for 2 weeks prior to and 1 week after surgery.  If you should need take something for relief of minor pain, you may take acetaminophen which is found in Tylenol,m Datril, Anacin-3 and Panadol. It is not blood thinner. The products listed below are.  Do not take any of the products listed below in addition to any listed on your instruction sheet.  A.P.C or A.P.C with Codeine Codeine Phosphate Capsules #3 Ibuprofen Ridaura  ABC compound Congesprin Imuran rimadil  Advil Cope Indocin Robaxisal  Alka-Seltzer Effervescent Pain Reliever and Antacid Coricidin or Coricidin-D  Indomethacin Rufen  Alka-Seltzer plus Cold Medicine Cosprin Ketoprofen S-A-C Tablets  Anacin Analgesic Tablets or Capsules Coumadin Korlgesic Salflex  Anacin Extra Strength Analgesic tablets or capsules CP-2 Tablets Lanoril Salicylate  Anaprox Cuprimine Capsules Levenox Salocol  Anexsia-D Dalteparin Magan Salsalate  Anodynos Darvon compound Magnesium Salicylate Sine-off  Ansaid Dasin Capsules Magsal Sodium Salicylate  Anturane Depen Capsules Marnal Soma  APF Arthritis pain formula Dewitt's Pills Measurin Stanback  Argesic Dia-Gesic Meclofenamic Sulfinpyrazone  Arthritis Bayer Timed Release Aspirin Diclofenac Meclomen Sulindac   Arthritis pain formula Anacin Dicumarol Medipren Supac  Analgesic (Safety coated) Arthralgen Diffunasal Mefanamic Suprofen  Arthritis Strength Bufferin Dihydrocodeine Mepro Compound Suprol  Arthropan liquid Dopirydamole Methcarbomol with Aspirin Synalgos  ASA tablets/Enseals Disalcid Micrainin Tagament  Ascriptin Doan's Midol Talwin  Ascriptin A/D Dolene Mobidin Tanderil  Ascriptin Extra Strength Dolobid Moblgesic Ticlid  Ascriptin with Codeine Doloprin or Doloprin with Codeine Momentum Tolectin  Asperbuf Duoprin Mono-gesic Trendar  Aspergum Duradyne Motrin or Motrin IB Triminicin  Aspirin plain, buffered or enteric coated Durasal Myochrisine Trigesic  Aspirin Suppositories Easprin Nalfon Trillsate  Aspirin with Codeine Ecotrin Regular or Extra Strength Naprosyn Uracel  Atromid-S Efficin Naproxen Ursinus  Auranofin Capsules Elmiron Neocylate Vanquish  Axotal Emagrin Norgesic Verin  Azathioprine Empirin or Empirin with Codeine Normiflo Vitamin E  Azolid Emprazil Nuprin Voltaren  Bayer Aspirin plain, buffered or children's or timed BC Tablets or powders Encaprin Orgaran Warfarin Sodium  Buff-a-Comp Enoxaparin Orudis Zorpin  Buff-a-Comp with Codeine Equegesic Os-Cal-Gesic   Buffaprin Excedrin plain, buffered or Extra Strength Oxalid   Bufferin Arthritis Strength Feldene Oxphenbutazone   Bufferin plain or Extra Strength Feldene Capsules Oxycodone with Aspirin   Bufferin with Codeine Fenoprofen Fenoprofen Pabalate or Pabalate-SF   Buffets II Flogesic Panagesic   Buffinol plain or Extra Strength Florinal or Florinal with Codeine Panwarfarin   Buf-Tabs Flurbiprofen Penicillamine   Butalbital Compound Four-way cold tablets Penicillin   Butazolidin Fragmin Pepto-Bismol   Carbenicillin Geminisyn Percodan   Carna Arthritis Reliever Geopen Persantine   Carprofen Gold's salt Persistin   Chloramphenicol Goody's Phenylbutazone   Chloromycetin Haltrain Piroxlcam   Clmetidine heparin Plaquenil    Cllnoril Hyco-pap Ponstel   Clofibrate Hydroxy chloroquine Propoxyphen         Before stopping any of these medications, be sure to consult the physician who ordered them.  Some, such as Coumadin (Warfarin) are ordered to prevent or treat serious conditions such as "deep thrombosis", "pumonary embolisms", and other heart problems.  The amount of time that you may need off of the medication may also vary with the medication and the reason for which you were taking it.  If you are taking any of these medications, please make sure you notify your pain physician before you undergo any procedures.          Facet Joint Block The facet joints connect the bones of the spine (vertebrae). They make it possible for you to bend, twist, and make other movements with your spine. They also keep you from bending too far, twisting too far, and making other excessive movements. A facet joint block is a procedure where a numbing medicine (anesthetic) is injected into a facet joint. Often, a type of anti-inflammatory medicine called a steroid is also injected. A facet joint block may be done to diagnose neck or back pain. If the pain gets better after a facet joint block, it means the pain is probably coming from the facet joint. If the pain does not get better, it means the pain is probably not coming from the facet joint. A facet joint block may also be done to relieve neck or back pain caused by an inflamed facet joint. A facet joint block is only done to relieve pain if the pain does not improve with other methods, such as medicine, exercise programs, and physical therapy. Tell a health care provider about:  Any allergies you have.  All medicines you are taking, including vitamins, herbs, eye drops, creams, and over-the-counter medicines.  Any problems you or family members have had with anesthetic medicines.  Any blood disorders you have.  Any surgeries you have had.  Any medical conditions you  have.  Whether you are pregnant or may be pregnant. What are the risks? Generally, this is a safe procedure. However, problems may occur, including:  Bleeding.  Injury to a nerve near the injection site.  Pain at the injection site.  Weakness or numbness in areas controlled by nerves near the injection site.  Infection.  Temporary fluid retention.  Allergic reactions to medicines or dyes.  Injury to other structures or organs near the injection site.  What happens before the procedure?  Follow instructions from your health care provider about eating  or drinking restrictions.  Ask your health care provider about: ? Changing or stopping your regular medicines. This is especially important if you are taking diabetes medicines or blood thinners. ? Taking medicines such as aspirin and ibuprofen. These medicines can thin your blood. Do not take these medicines before your procedure if your health care provider instructs you not to.  Do not take any new dietary supplements or medicines without asking your health care provider first.  Plan to have someone take you home after the procedure. What happens during the procedure?  You may need to remove your clothing and dress in an open-back gown.  The procedure will be done while you are lying on an X-ray table. You will most likely be asked to lie on your stomach, but you may be asked to lie in a different position if an injection will be made in your neck.  Machines will be used to monitor your oxygen levels, heart rate, and blood pressure.  If an injection will be made in your neck, an IV tube will be inserted into one of your veins. Fluids and medicine will flow directly into your body through the IV tube.  The area over the facet joint where the injection will be made will be cleaned with soap. The surrounding skin will be covered with clean drapes.  A numbing medicine (local anesthetic) will be applied to your skin. Your skin  may sting or burn for a moment.  A video X-ray machine (fluoroscopy) will be used to locate the joint. In some cases, a CT scan may be used.  A contrast dye may be injected into the facet joint area to help locate the joint.  When the joint is located, an anesthetic will be injected into the joint through the needle.  Your health care provider will ask you whether you feel pain relief. If you do feel relief, a steroid may be injected to provide pain relief for a longer period of time. If you do not feel relief or feel only partial relief, additional injections of an anesthetic may be made in other facet joints.  The needle will be removed.  Your skin will be cleaned.  A bandage (dressing) will be applied over each injection site. The procedure may vary among health care providers and hospitals. What happens after the procedure?  You will be observed for 15-30 minutes before being allowed to go home. This information is not intended to replace advice given to you by your health care provider. Make sure you discuss any questions you have with your health care provider. Document Released: 03/14/2007 Document Revised: 11/24/2015 Document Reviewed: 07/19/2015 Elsevier Interactive Patient Education  [employer].

## 2017-07-16 NOTE — Telephone Encounter (Signed)
Patient states that the dates on her prescriptions given today are not correct. Will look into this. Patient has appt tomorrow and states she will bring them back

## 2017-07-16 NOTE — Telephone Encounter (Signed)
Called to inform patient that new prescriptions were written. Instructed to bring the 2 prescriptions she got today so we can destroy before giving new ones. Pt with understanding.

## 2017-07-16 NOTE — Progress Notes (Signed)
Patient's Name: Nicole Walter  MRN: [id number]  Referring Provider: Leone Haven, MD  DOB: [birth year]  PCP: Leone Haven, MD  DOS: 07/16/2017  Note by: Vevelyn Francois NP  Service setting: Ambulatory outpatient  Specialty: Interventional Pain Management  Location: [facility] (AMB) Pain Management Facility    Patient type: Established    Primary Reason(s) for Visit: Encounter for prescription drug management & post-procedure evaluation of chronic illness with mild to moderate exacerbation(Level of risk: moderate) CC: Neck Pain (cervical) and Knee Pain (left)  HPI  Nicole Walter is a 56 y.o. year old, female patient, who comes today for a post-procedure evaluation and medication management. She has Anxiety and depression; Chronic pain syndrome; Lumbar spondylosis; Combined fat and carbohydrate induced hyperlipemia; Thoracic back pain; Chronic neck pain (Location of Primary Source of Pain) (Bilateral) (L>R); Concentration deficit; Hyperlipidemia; Cervical spondylosis (Bilateral); Fibromyalgia; Osteopenia; Skin lesions; Chronic hip pain (Bilateral) (L>R); Bursitis of right shoulder; Chronic Greater trochanteric bursitis (Bilateral) (L>R); Long term current use of opiate analgesic; Long term prescription opiate use; Opiate use; Chronic upper back pain (Location of Secondary source of pain) (Bilateral) (L>R); Chronic shoulder pain (Location of Tertiary source of pain) (Bilateral) (L>R); Chronic low back pain (Bilateral) (L>R); Chronic knee pain (Right); Chronic upper extremity pain (Left); Chronic lower extremity pain (Bilateral) (L>R); Chronic abdominal pain; Elevated sedimentation rate; Elevated C-reactive protein (CRP); Overweight (BMI 25.0-29.9); Greater trochanteric bursitis (Right); Subacromial bursitis of shoulder joint (Right); Osteoarthritis of hip (Bilateral) (L>R); Thoracic radiculitis (R>L); Thoracic spondylosis; Shortness of breath; Osteoarthritis of shoulder (Right); Osteoarthritis of knee  (Right); Chronic knee arthropathy (Right); Chronic right hip pain; Muscle spasm; Myofascial pain syndrome, cervical (trapezius) (Left); Cervical facet syndrome (Bilateral) (L>R); and Constipation on her problem list. Her primarily concern today is the Neck Pain (cervical) and Knee Pain (left)  Pain Assessment: Location: Lower Neck Radiating: down shoulders bilateral and around to collar bone and down to elbow to  Onset: More than a month ago Duration: Chronic pain Quality: Burning, Cramping, Stabbing Severity: 2 /10 (self-reported pain score)  Note: Reported level is compatible with observation.                   Effect on ADL: exercise, vacuum Timing:   Modifying factors: ice, medications  Nicole Walter was last seen on 05/15/2017 for a procedure. During today's appointment we reviewed Nicole Walter's post-procedure results, as well as her outpatient medication regimen. She is complaining that the pain is more in her neck radiating up into her head and to her shoulders. She admits that the trigger point injection was painful and not effective for this pain. She feels like it was too much pain to bare for the lack of relief.   Further details on both, my assessment(s), as well as the proposed treatment plan, please see below.  Controlled Substance Pharmacotherapy Assessment REMS (Risk Evaluation and Mitigation Strategy)  Analgesic:Oxycodone one tablet by mouth 4times a day (43m/dayof hydrocodone) MME/day:351mday  GaIgnatius SpeckingRN  07/16/2017  9:37 AM  Sign at close encounter Nursing Pain Medication Assessment:  Safety precautions to be maintained throughout the outpatient stay will include: orient to surroundings, keep bed in low position, maintain call bell within reach at all times, provide assistance with transfer out of bed and ambulation.  Medication Inspection Compliance: Pill count conducted under aseptic conditions, in front of the patient. Neither the pills nor the bottle was  removed from the patient's sight at any time. Once count was completed pills were  immediately returned to the patient in their original bottle.  Medication: See above Pill/Patch Count: 108 of 120 pills remain Pill/Patch Appearance: Markings consistent with prescribed medication Bottle Appearance: Standard pharmacy container. Clearly labeled. Filled Date: 09 / 09 / 2018 Last Medication intake:  Today   Pharmacokinetics: Liberation and absorption (onset of action): WNL Distribution (time to peak effect): WNL Metabolism and excretion (duration of action): WNL         Pharmacodynamics: Desired effects: Analgesia: Nicole Walter reports >50% benefit. Functional ability: Patient reports that medication allows her to accomplish basic ADLs Clinically meaningful improvement in function (CMIF): Sustained CMIF goals met Perceived effectiveness: Described as relatively effective, allowing for increase in activities of daily living (ADL) Undesirable effects: Side-effects or Adverse reactions: None reported Monitoring: [place] PMP: Online review of the past 61-monthperiod conducted. Compliant with practice rules and regulations List of all UDS test(s) done:  Lab Results  Component Value Date   SUMMARY FINAL 03/19/2017   SUMMARY FINAL 07/20/2016   Last UDS on record: Summary  Date Value Ref Range Status  03/19/2017 FINAL  Final    Comment:    ==================================================================== TOXASSURE SELECT 13 (MW) ==================================================================== Test                             Result       Flag       Units Drug Present and Declared for Prescription Verification   Oxycodone                      512          EXPECTED   ng/mg creat   Oxymorphone                    446          EXPECTED   ng/mg creat   Noroxycodone                   1331         EXPECTED   ng/mg creat   Noroxymorphone                 256          EXPECTED   ng/mg creat     Sources of oxycodone are scheduled prescription medications.    Oxymorphone, noroxycodone, and noroxymorphone are expected    metabolites of oxycodone. Oxymorphone is also available as a    scheduled prescription medication. ==================================================================== Test                      Result    Flag   Units      Ref Range   Creatinine              111              mg/dL      >=20 ==================================================================== Declared Medications:  The flagging and interpretation on this report are based on the  following declared medications.  Unexpected results may arise from  inaccuracies in the declared medications.  **Note: The testing scope of this panel includes these medications:  Oxycodone  **Note: The testing scope of this panel does not include following  reported medications:  Fluticasone (Flonase)  Gabapentin  Hydroxyzine (Vistaril)  Multivitamin  Rabeprazole (Aciphex)  Simvastatin (Zocor)  Sucralfate (Carafate)  Tizanidine (Zanaflex)  Topical (Lidex)  Trazodone  Vitamin D  Vortioxetine (Trintellix) ==================================================================== For clinical consultation, please call [phone]. ====================================================================    UDS interpretation: Compliant          Medication Assessment Form: Reviewed. Patient indicates being compliant with therapy Treatment compliance: Compliant Risk Assessment Profile: Aberrant behavior: See prior evaluations. None observed or detected today Comorbid factors increasing risk of overdose: See prior notes. No additional risks detected today Risk of substance use disorder (SUD): Low     Opioid Risk Tool - 07/16/17 0933      Family History of Substance Abuse   Alcohol Negative   Illegal Drugs Positive Female   Rx Drugs Negative     Personal History of Substance Abuse   Alcohol Negative   Illegal Drugs  Negative   Rx Drugs Negative     History of Preadolescent Sexual Abuse   History of Preadolescent Sexual Abuse Negative or Female     Psychological Disease   Psychological Disease Positive   ADD Negative   OCD Negative   Bipolar Negative   Schizophrenia Negative   Depression Positive     Total Score   Opioid Risk Tool Scoring 5   Opioid Risk Interpretation Moderate Risk     ORT Scoring interpretation table:  Score <3 = Low Risk for SUD  Score between 4-7 = Moderate Risk for SUD  Score >8 = High Risk for Opioid Abuse   Risk Mitigation Strategies:  Patient Counseling: Covered Patient-Prescriber Agreement (PPA): Present and active  Notification to other healthcare providers: Done  Pharmacologic Plan: No change in therapy, at this time  Post-Procedure Assessment   Procedure: 05/28/17 Pre-procedure pain score:  1/10 Post-procedure pain score: 0/10         Influential Factors: BMI: 27.12 kg/m Intra-procedural challenges: None observed.         Assessment challenges: None detected.              Reported side-effects: None.        Post-procedural adverse reactions or complications: None reported         Sedation: Please see nurses note. When no sedatives are used, the analgesic levels obtained are directly associated to the effectiveness of the local anesthetics. However, when sedation is provided, the level of analgesia obtained during the initial 1 hour following the intervention, is believed to be the result of a combination of factors. These factors may include, but are not limited to: 1. The effectiveness of the local anesthetics used. 2. The effects of the analgesic(s) and/or anxiolytic(s) used. 3. The degree of discomfort experienced by the patient at the time of the procedure. 4. The patients ability and reliability in recalling and recording the events. 5. The presence and influence of possible secondary gains and/or psychosocial factors. Reported result: Relief  experienced during the 1st hour after the procedure: 100 % (Ultra-Short Term Relief)            Interpretative annotation: Clinically appropriate result. Analgesia during this period is likely to be Local Anesthetic and/or IV Sedative (Analgesic/Anxiolytic) related.          Effects of local anesthetic: The analgesic effects attained during this period are directly associated to the localized infiltration of local anesthetics and therefore cary significant diagnostic value as to the etiological location, or anatomical origin, of the pain. Expected duration of relief is directly dependent on the pharmacodynamics of the local anesthetic used. Long-acting (4-6 hours) anesthetics used.  Reported result: Relief during the next 4 to 6 hour after the procedure: 100 % (  Short-Term Relief)            Interpretative annotation: Clinically appropriate result. Analgesia during this period is likely to be Local Anesthetic-related.          Long-term benefit: Defined as the period of time past the expected duration of local anesthetics (1 hour for short-acting and 4-6 hours for long-acting). With the possible exception of prolonged sympathetic blockade from the local anesthetics, benefits during this period are typically attributed to, or associated with, other factors such as analgesic sensory neuropraxia, antiinflammatory effects, or beneficial biochemical changes provided by agents other than the local anesthetics.  Reported result: Extended relief following procedure: 0 % (1 1/2 weeks) (Long-Term Relief)            Interpretative annotation: Clinically appropriate result. Good relief. No permanent benefit expected. Inflammation plays a part in the etiology to the pain.          Current benefits: Defined as persistent relief that continues at this point in time.   Reported results: Treated area: 0 %       Interpretative annotation: Recurrence of symptoms. No permanent benefit expected. Effective diagnostic  intervention.          Interpretation: Results would suggest failure of therapy in achieving desired goal(s). Possible adjustment in plan of care may be required          Plan:  Please see "Plan of Care" for details.  Laboratory Chemistry  Inflammation Markers (CRP: Acute Phase) (ESR: Chronic Phase) Lab Results  Component Value Date   CRP 2.1 (H) 07/20/2016   ESRSEDRATE 2 05/15/2017                 Renal Function Markers Lab Results  Component Value Date   BUN 13 01/15/2017   CREATININE 0.89 01/15/2017   GFRAA >60 07/20/2016   GFRNONAA >60 07/20/2016                 Hepatic Function Markers Lab Results  Component Value Date   AST 31 01/19/2017   ALT 45 (H) 01/19/2017   ALBUMIN 4.3 01/19/2017   ALKPHOS 74 01/19/2017   HCVAB NEGATIVE 02/14/2017                 Electrolytes Lab Results  Component Value Date   NA 141 01/15/2017   K 4.3 01/15/2017   CL 103 01/15/2017   CALCIUM 9.3 01/15/2017   MG 2.2 05/15/2017                 Neuropathy Markers Lab Results  Component Value Date   VITAMINB12 987 (H) 07/20/2016                 Bone Pathology Markers Lab Results  Component Value Date   ALKPHOS 74 01/19/2017   25OHVITD1 58 07/20/2016   25OHVITD2 <1.0 07/20/2016   25OHVITD3 58 07/20/2016   CALCIUM 9.3 01/15/2017                 Coagulation Parameters Lab Results  Component Value Date   PLT 300 01/15/2017                 Cardiovascular Markers Lab Results  Component Value Date   HGB 12.4 01/15/2017   HCT 37.8 01/15/2017                 Note: Lab results reviewed.  Recent Diagnostic Imaging Review  Dg Cervical Spine Complete  Result Date: 05/15/2017 CLINICAL DATA:  Cervicalgia EXAM: CERVICAL SPINE - COMPLETE 4+ VIEW COMPARISON:  Cervical MRI December 31, 2012 FINDINGS: Frontal, lateral, open-mouth odontoid, and bilateral oblique views were obtained. There is mild upper thoracic levoscoliosis. There is no evident fracture or spondylolisthesis. There  is slight disc space narrowing at C5-6. Other disc spaces appear normal. There is no appreciable exit foraminal narrowing on the oblique views. IMPRESSION: Slight osteoarthritic changes C5-6. No fracture or spondylolisthesis. Electronically Signed   By: Lowella Grip III M.D.   On: 05/15/2017 12:43   Note: Imaging results reviewed.          Meds   Current Outpatient Prescriptions:  .  Calcium Carbonate-Vit D-Min (CALCIUM 1200 PO), Take by mouth., Disp: , Rfl:  .  Cholecalciferol (VITAMIN D) 2000 units tablet, Take 4,000 Units by mouth daily., Disp: , Rfl:  .  fluocinonide ointment (LIDEX) 0.05 %, APPLY TOPICALLY TWICE A DAY TO ITCHY AREAS ONLY. as needed, Disp: , Rfl: 0 .  fluticasone (FLONASE) 50 MCG/ACT nasal spray, instill 2 sprays into each nostril once daily, Disp: 16 g, Rfl: 11 .  hydrOXYzine (VISTARIL) 25 MG capsule, Take 25 mg by mouth 2 (two) times daily as needed., Disp: , Rfl: 1 .  lamoTRIgine (LAMICTAL) 25 MG tablet, take 1 tablet by mouth once daily for 2 weeks then INCREASE TO 2 ...  (REFER TO PRESCRIPTION NOTES)., Disp: , Rfl: 0 .  Multiple Vitamin (MULTI-VITAMINS) TABS, Take 1 tablet by mouth daily. , Disp: , Rfl:  .  [START ON 08/11/2017] oxyCODONE (OXY IR/ROXICODONE) 5 MG immediate release tablet, Take 1 tablet (5 mg total) by mouth every 6 (six) hours as needed for severe pain., Disp: 120 tablet, Rfl: 0 .  RABEprazole (ACIPHEX) 20 MG tablet, Take 20 mg by mouth as needed. , Disp: , Rfl:  .  SAXENDA 18 MG/3ML SOPN, inject 0.6 milligram subcutaneously once daily for 1 week then 1....  (REFER TO PRESCRIPTION NOTES)., Disp: , Rfl: 0 .  simvastatin (ZOCOR) 20 MG tablet, take 1 tablet by mouth at bedtime, Disp: 90 tablet, Rfl: 1 .  sucralfate (CARAFATE) 1 g tablet, take 1 tablet by mouth three times a day if needed for abdominal pain, Disp: , Rfl: 0 .  TRINTELLIX 10 MG TABS, 15 mg. , Disp: , Rfl: 0 .  lubiprostone (AMITIZA) 24 MCG capsule, Take 1 capsule (24 mcg total) by mouth 2  (two) times daily with a meal. Swallow the medication whole. Do not break or chew the medication., Disp: 60 capsule, Rfl: 0 .  [START ON 09/10/2017] oxyCODONE (OXY IR/ROXICODONE) 5 MG immediate release tablet, Take 1 tablet (5 mg total) by mouth every 6 (six) hours as needed for severe pain., Disp: 120 tablet, Rfl: 0  ROS  Constitutional: Denies any fever or chills Gastrointestinal: No reported hemesis, hematochezia, vomiting, or acute GI distress Musculoskeletal: Denies any acute onset joint swelling, redness, loss of ROM, or weakness Neurological: No reported episodes of acute onset apraxia, aphasia, dysarthria, agnosia, amnesia, paralysis, loss of coordination, or loss of consciousness  Allergies  Ms. Piche is allergic to other and food.  PFSH  Drug: Ms. Krzyzanowski  reports that she does not use drugs. Alcohol:  reports that she does not drink alcohol. Tobacco:  reports that she quit smoking about 2 years ago. She started smoking about 43 years ago. She has a 35.00 pack-year smoking history. She has never used smokeless tobacco. Medical:  has a past medical history of Abnormal liver enzymes (02/08/2016); Allergy; Anxiety; Chest pain (  07/05/2016); Chronic pain; Depression; DJD (degenerative joint disease); Fibromyalgia; High cholesterol; Rash of back (01/28/2016); and Weight loss due to medication. Surgical: Ms. Valentine  has a past surgical history that includes Cholecystectomy; Tubal ligation; Ankle surgery; Nasal sinus surgery; Tonsillectomy; Colonoscopy; Esophagogastroduodenoscopy (egd) with propofol (N/A, 02/22/2016); Colonoscopy with propofol (N/A, 02/22/2016); and Breast biopsy (Left, 1997). Family: family history includes Arthritis in her father, paternal grandfather, and paternal grandmother; Cancer in her brother and father; Dementia in her sister; Diabetes in her sister; Drug abuse in her brother, brother, and sister; Heart disease in her mother; Post-traumatic stress disorder in her brother; Stroke  in her mother.  Constitutional Exam  General appearance: Well nourished, well developed, and well hydrated. In no apparent acute distress Vitals:   07/16/17 0922  BP: 121/72  Pulse: 77  Resp: 16  Temp: 98 F (36.7 C)  SpO2: 99%  Weight: 158 lb (71.7 kg)  Height: _0  (1.626 m)  BMI Assessment: Estimated body mass index is 27.12 kg/m as calculated from the following:   Height as of this encounter: _1  (1.626 m).   Weight as of this encounter: 158 lb (71.7 kg). Psych/Mental status: Alert, oriented x 3 (person, place, & time)       Eyes: PERLA Respiratory: No evidence of acute respiratory distress  Cervical Spine Area Exam  Skin & Axial Inspection: No masses, redness, edema, swelling, or associated skin lesions Alignment: Symmetrical Functional ROM: Unrestricted ROM      Stability: No instability detected Muscle Tone/Strength: Functionally intact. No obvious neuro-muscular anomalies detected. Sensory (Neurological): Unimpaired Palpation: Complains of area being tender to palpation              Upper Extremity (UE) Exam    Side: Right upper extremity  Side: Left upper extremity  Skin & Extremity Inspection: Skin color, temperature, and hair growth are WNL. No peripheral edema or cyanosis. No masses, redness, swelling, asymmetry, or associated skin lesions. No contractures.  Skin & Extremity Inspection: Skin color, temperature, and hair growth are WNL. No peripheral edema or cyanosis. No masses, redness, swelling, asymmetry, or associated skin lesions. No contractures.  Functional ROM: Unrestricted ROM          Functional ROM: Unrestricted ROM          Muscle Tone/Strength: Functionally intact. No obvious neuro-muscular anomalies detected.  Muscle Tone/Strength: Functionally intact. No obvious neuro-muscular anomalies detected.  Sensory (Neurological): Unimpaired          Sensory (Neurological): Unimpaired          Palpation: No palpable anomalies              Palpation: No  palpable anomalies              Specialized Test(s): Deferred         Specialized Test(s): Deferred          Thoracic Spine Area Exam  Skin & Axial Inspection: No masses, redness, or swelling Alignment: Symmetrical Functional ROM: Unrestricted ROM Stability: No instability detected Muscle Tone/Strength: Functionally intact. No obvious neuro-muscular anomalies detected. Sensory (Neurological): Unimpaired Muscle strength & Tone: No palpable anomalies  Lumbar Spine Area Exam  Skin & Axial Inspection: No masses, redness, or swelling Alignment: Symmetrical Functional ROM: Unrestricted ROM      Stability: No instability detected Muscle Tone/Strength: Functionally intact. No obvious neuro-muscular anomalies detected. Sensory (Neurological): Unimpaired Palpation: No palpable anomalies       Provocative Tests: Lumbar Hyperextension and rotation test: evaluation deferred today  Lumbar Lateral bending test: evaluation deferred today       Patrick's Maneuver: evaluation deferred today                    Gait & Posture Assessment  Ambulation: Unassisted Gait: Relatively normal for age and body habitus Posture: WNL   Lower Extremity Exam    Side: Right lower extremity  Side: Left lower extremity  Skin & Extremity Inspection: Skin color, temperature, and hair growth are WNL. No peripheral edema or cyanosis. No masses, redness, swelling, asymmetry, or associated skin lesions. No contractures.  Skin & Extremity Inspection: Skin color, temperature, and hair growth are WNL. No peripheral edema or cyanosis. No masses, redness, swelling, asymmetry, or associated skin lesions. No contractures.  Functional ROM: Unrestricted ROM          Functional ROM: Unrestricted ROM          Muscle Tone/Strength: Functionally intact. No obvious neuro-muscular anomalies detected.  Muscle Tone/Strength: Functionally intact. No obvious neuro-muscular anomalies detected.  Sensory (Neurological): Unimpaired  Sensory  (Neurological): Unimpaired  Palpation: No palpable anomalies  Palpation: No palpable anomalies   Assessment  Primary Diagnosis & Pertinent Problem List: The primary encounter diagnosis was Chronic neck pain (Location of Primary Source of Pain) (Bilateral) (L>R). Diagnoses of Cervical facet syndrome (Bilateral) (L>R), Cervical spondylosis (Bilateral), Osteoarthritis of shoulder (Right), Chronic pain syndrome, and Constipation, unspecified constipation type were also pertinent to this visit.  Status Diagnosis  Controlled Controlled Controlled 1. Chronic neck pain (Location of Primary Source of Pain) (Bilateral) (L>R)   2. Cervical facet syndrome (Bilateral) (L>R)   3. Cervical spondylosis (Bilateral)   4. Osteoarthritis of shoulder (Right)   5. Chronic pain syndrome   6. Constipation, unspecified constipation type     Problems updated and reviewed during this visit: Problem  Constipation   Plan of Care  Pharmacotherapy (Medications Ordered): Meds ordered this encounter  Medications  . lubiprostone (AMITIZA) 24 MCG capsule    Sig: Take 1 capsule (24 mcg total) by mouth 2 (two) times daily with a meal. Swallow the medication whole. Do not break or chew the medication.    Dispense:  60 capsule    Refill:  0    Do not place this medication, or any other prescription from our practice, on "Automatic Refill". Patient may have prescription filled one day early if pharmacy is closed on scheduled refill date.    Order Specific Question:   Supervising Provider    Answer:   Milinda Pointer [phone]  . oxyCODONE (OXY IR/ROXICODONE) 5 MG immediate release tablet    Sig: Take 1 tablet (5 mg total) by mouth every 6 (six) hours as needed for severe pain.    Dispense:  120 tablet    Refill:  0    Do not place this medication, or any other prescription from our practice, on "Automatic Refill". Patient may have prescription filled one day early if pharmacy is closed on scheduled refill date. Do  not fill until:08/11/2017 To last until: 09/10/2017    Order Specific Question:   Supervising Provider    Answer:   Milinda Pointer [phone]  . oxyCODONE (OXY IR/ROXICODONE) 5 MG immediate release tablet    Sig: Take 1 tablet (5 mg total) by mouth every 6 (six) hours as needed for severe pain.    Dispense:  120 tablet    Refill:  0    Do not place this medication, or any other prescription from our practice, on "Automatic  Refill". Patient may have prescription filled one day early if pharmacy is closed on scheduled refill date. Do not fill until:09/10/2017 To last until:10/10/2017    Order Specific Question:   Supervising Provider    Answer:   Milinda Pointer [phone]   New Prescriptions   LUBIPROSTONE (AMITIZA) 24 MCG CAPSULE    Take 1 capsule (24 mcg total) by mouth 2 (two) times daily with a meal. Swallow the medication whole. Do not break or chew the medication.   Medications administered today: Ms. Klas had no medications administered during this visit. Lab-work, procedure(s), and/or referral(s): Orders Placed This Encounter  Procedures  . CERVICAL FACET (MEDIAL BRANCH NERVE BLOCK)    Imaging and/or referral(s): None  Interventional therapies: Planned, scheduled, and/or pending:   Diagnostic bilateral cervical facet block   Considering:   Diagnostic bilateral cervical facet block Possible bilateral cervical facet radiofrequencyablation Diagnostic left-sided cervical epidural steroid injection Diagnostic bilateral intra-articular shoulder joint injection Diagnostic bilateral suprascapular nerve block Possible bilateral suprascapular nerve radiofrequencyablation Diagnostic bilateral lumbar facet block Possible bilateral lumbar facet radiofrequencyablation  Diagnostic bilateral intra-articular hip joint injection Diagnostic bilateral femoral and obturator tickler branch blocks Possible bilateral hip joint radiofrequencyablation  Diagnosticright intra-articular  knee joint injection Possible series of 5, right-sided, intra-articular knee joint injections with Hyalgan.  Possible right sided genicular nerve block Possible right sided genicular nerve radiofrequencyablation Possible left-sided lumbar epidural steroid injection   Palliative PRN treatment(s):   Palliativebilateral suprascapular nerve block Palliativeright intra-articular knee joint injection   Provider-requested follow-up: Return in about 2 months (around 09/15/2017) for Procedure(w/Sedation), w/ Dr. Dossie Arbour, ([facility]), [facility].  Future Appointments Date Time Provider Gideon  07/17/2017 8:30 AM Milinda Pointer, MD [facility] None  07/19/2017 9:15 AM Leone Haven, MD [facility] None  07/30/2017 9:30 AM Gardiner Barefoot, DPM [facility]  08/06/2017 1:45 PM Milinda Pointer, MD [facility] None  09/10/2017 9:00 AM Vevelyn Francois, NP [facility] None   Primary Care Physician: Leone Haven, MD Location: [facility] Note by: Vevelyn Francois NP Date: 07/16/2017; Time: 2:02 PM  Pain Score Disclaimer: We use the NRS-11 scale. This is a self-reported, subjective measurement of pain severity with only modest accuracy. It is used primarily to identify changes within a particular patient. It must be understood that outpatient pain scales are significantly less accurate that those used for research, where they can be applied under ideal controlled circumstances with minimal exposure to variables. In reality, the score is likely to be a combination of pain intensity and pain affect, where pain affect describes the degree of emotional arousal or changes in action readiness caused by the sensory experience of pain. Factors such as social and work situation, setting, emotional state, anxiety levels, expectation, and prior pain experience may influence pain perception and show large inter-individual differences that may also be affected by time  variables.  Patient instructions provided during this appointment: Patient Instructions    ____________________________________________________________________________________________  Medication Rules  Applies to: All patients receiving prescriptions (written or electronic).  Pharmacy of record: Pharmacy where electronic prescriptions will be sent. If written prescriptions are taken to a different pharmacy, please inform the nursing staff. The pharmacy listed in the electronic medical record should be the one where you would like electronic prescriptions to be sent.  Prescription refills: Only during scheduled appointments. Applies to both, written and electronic prescriptions.  NOTE: The following applies primarily to controlled substances (Opioid* Pain Medications).   Patient's responsibilities: 1. Pain Pills: Bring all pain pills  to every appointment (except for procedure appointments). 2. Pill Bottles: Bring pills in original pharmacy bottle. Always bring newest bottle. Bring bottle, even if empty. 3. Medication refills: You are responsible for knowing and keeping track of what medications you need refilled. The day before your appointment, write a list of all prescriptions that need to be refilled. Bring that list to your appointment and give it to the admitting nurse. Prescriptions will be written only during appointments. If you forget a medication, it will not be "Called in", "Faxed", or "electronically sent". You will need to get another appointment to get these prescribed. 4. Prescription Accuracy: You are responsible for carefully inspecting your prescriptions before leaving our office. Have the discharge nurse carefully go over each prescription with you, before taking them home. Make sure that your name is accurately spelled, that your address is correct. Check the name and dose of your medication to make sure it is accurate. Check the number of pills, and the written instructions  to make sure they are clear and accurate. Make sure that you are given enough medication to last until your next medication refill appointment. 5. Taking Medication: Take medication as prescribed. Never take more pills than instructed. Never take medication more frequently than prescribed. Taking less pills or less frequently is permitted and encouraged, when it comes to controlled substances (written prescriptions).  6. Inform other Doctors: Always inform, all of your healthcare providers, of all the medications you take. 7. Pain Medication from other Providers: You are not allowed to accept any additional pain medication from any other Doctor or Healthcare provider. There are two exceptions to this rule. (see below) In the event that you require additional pain medication, you are responsible for notifying us, as stated below. 8. Medication Agreement: You are responsible for carefully reading and following our Medication Agreement. This must be signed before receiving any prescriptions from our practice. Safely store a copy of your signed Agreement. Violations to the Agreement will result in no further prescriptions. (Additional copies of our Medication Agreement are available upon request.) 9. Laws, Rules, & Regulations: All patients are expected to follow all Federal and [employer], [employer], Rules, [employer]. Ignorance of the Laws does not constitute a valid excuse. The use of any illegal substances is prohibited. 10. Adopted CDC guidelines & recommendations: Target dosing levels will be at or below 60 MME/day. Use of benzodiazepines** is not recommended.  Exceptions: There are only two exceptions to the rule of not receiving pain medications from other Healthcare Providers. 1. Exception #1 (Emergencies): In the event of an emergency (i.e.: accident requiring emergency care), you are allowed to receive additional pain medication. However, you are responsible for: As soon as you are able, call our  office [phone], at any time of the day or night, and leave a message stating your name, the date and nature of the emergency, and the name and dose of the medication prescribed. In the event that your call is answered by a member of our staff, make sure to document and save the date, time, and the name of the person that took your information.  2. Exception #2 (Planned Surgery): In the event that you are scheduled by another doctor or dentist to have any type of surgery or procedure, you are allowed (for a period no longer than 30 days), to receive additional pain medication, for the acute post-op pain. However, in this case, you are responsible for picking up a copy of our "Post-op Pain  Management for Surgeons" handout, and giving it to your surgeon or dentist. This document is available at our office, and does not require an appointment to obtain it. Simply go to our office during business hours (Monday-Thursday from 8:00 AM to 4:00 PM) (Friday 8:00 AM to 12:00 Noon) or if you have a scheduled appointment with [place], prior to your surgery, and ask for it by name. In addition, you will need to provide [place] with your name, name of your surgeon, type of surgery, and date of procedure or surgery.  *Opioid medications include: morphine, codeine, oxycodone, oxymorphone, hydrocodone, hydromorphone, meperidine, tramadol, tapentadol, buprenorphine, fentanyl, methadone. **Benzodiazepine medications include: diazepam (Valium), alprazolam (Xanax), clonazepam (Klonopine), lorazepam (Ativan), clorazepate (Tranxene), chlordiazepoxide (Librium), estazolam (Prosom), oxazepam (Serax), temazepam (Restoril), triazolam (Halcion)  ____________________________________________________________________________________________  BMI Assessment: Estimated body mass index is 27.12 kg/m as calculated from the following:   Height as of this encounter: '5\' 4"'$  (1.626 m).   Weight as of this encounter: 158 lb (71.7 kg).  BMI  interpretation table: BMI level Category Range association with higher incidence of chronic pain  <18 kg/m2 Underweight   18.5-24.9 kg/m2 Ideal body weight   25-29.9 kg/m2 Overweight Increased incidence by 20%  30-34.9 kg/m2 Obese (Class I) Increased incidence by 68%  35-39.9 kg/m2 Severe obesity (Class II) Increased incidence by 136%  >40 kg/m2 Extreme obesity (Class III) Increased incidence by 254%   BMI Readings from Last 4 Encounters:  07/16/17 27.12 kg/m  06/21/17 28.24 kg/m  05/28/17 29.18 kg/m  05/17/17 29.08 kg/m   Wt Readings from Last 4 Encounters:  07/16/17 158 lb (71.7 kg)  06/21/17 164 lb 8 oz (74.6 kg)  05/28/17 170 lb (77.1 kg)  05/17/17 169 lb 6.4 oz (76.8 kg)  GENERAL RISKS AND COMPLICATIONS  What are the risk, side effects and possible complications? Generally speaking, most procedures are safe.  However, with any procedure there are risks, side effects, and the possibility of complications.  The risks and complications are dependent upon the sites that are lesioned, or the type of nerve block to be performed.  The closer the procedure is to the spine, the more serious the risks are.  Great care is taken when placing the radio frequency needles, block needles or lesioning probes, but sometimes complications can occur. 1. Infection: Any time there is an injection through the skin, there is a risk of infection.  This is why sterile conditions are used for these blocks.  There are four possible types of infection. 1. Localized skin infection. 2. Central Nervous System Infection-This can be in the form of Meningitis, which can be deadly. 3. Epidural Infections-This can be in the form of an epidural abscess, which can cause pressure inside of the spine, causing compression of the spinal cord with subsequent paralysis. This would require an emergency surgery to decompress, and there are no guarantees that the patient would recover from the paralysis. 4. Discitis-This is an  infection of the intervertebral discs.  It occurs in about 1% of discography procedures.  It is difficult to treat and it may lead to surgery.        2. Pain: the needles have to go through skin and soft tissues, will cause soreness.       3. Damage to internal structures:  The nerves to be lesioned may be near blood vessels or    other nerves which can be potentially damaged.       4. Bleeding: Bleeding is more common if the patient is  taking blood thinners such as  aspirin, Coumadin, Ticiid, Plavix, etc., or if he/she have some genetic predisposition  such as hemophilia. Bleeding into the spinal canal can cause compression of the spinal  cord with subsequent paralysis.  This would require an emergency surgery to  decompress and there are no guarantees that the patient would recover from the  paralysis.       5. Pneumothorax:  Puncturing of a lung is a possibility, every time a needle is introduced in  the area of the chest or upper back.  Pneumothorax refers to free air around the  collapsed lung(s), inside of the thoracic cavity (chest cavity).  Another two possible  complications related to a similar event would include: Hemothorax and Chylothorax.   These are variations of the Pneumothorax, where instead of air around the collapsed  lung(s), you may have blood or chyle, respectively.       6. Spinal headaches: They may occur with any procedures in the area of the spine.       7. Persistent CSF (Cerebro-Spinal Fluid) leakage: This is a rare problem, but may occur  with prolonged intrathecal or epidural catheters either due to the formation of a fistulous  track or a dural tear.       8. Nerve damage: By working so close to the spinal cord, there is always a possibility of  nerve damage, which could be as serious as a permanent spinal cord injury with  paralysis.       9. Death:  Although rare, severe deadly allergic reactions known as "Anaphylactic  reaction" can occur to any of the medications used.       10. Worsening of the symptoms:  We can always make thing worse.  What are the chances of something like this happening? Chances of any of this occuring are extremely low.  By statistics, you have more of a chance of getting killed in a motor vehicle accident: while driving to the hospital than any of the above occurring .  Nevertheless, you should be aware that they are possibilities.  In general, it is similar to taking a shower.  Everybody knows that you can slip, hit your head and get killed.  Does that mean that you should not shower again?  Nevertheless always keep in mind that statistics do not mean anything if you happen to be on the wrong side of them.  Even if a procedure has a 1 (one) in a 1,000,000 (million) chance of going wrong, it you happen to be that one..Also, keep in mind that by statistics, you have more of a chance of having something go wrong when taking medications.  Who should not have this procedure? If you are on a blood thinning medication (e.g. Coumadin, Plavix, see list of "Blood Thinners"), or if you have an active infection going on, you should not have the procedure.  If you are taking any blood thinners, please inform your physician.  How should I prepare for this procedure?  Do not eat or drink anything at least six hours prior to the procedure.  Bring a driver with you .  It cannot be a taxi.  Come accompanied by an adult that can drive you back, and that is strong enough to help you if your legs get weak or numb from the local anesthetic.  Take all of your medicines the morning of the procedure with just enough water to swallow them.  If you have diabetes, make sure that  you are scheduled to have your procedure done first thing in the morning, whenever possible.  If you have diabetes, take only half of your insulin dose and notify our nurse that you have done so as soon as you arrive at the clinic.  If you are diabetic, but only take blood sugar pills  (oral hypoglycemic), then do not take them on the morning of your procedure.  You may take them after you have had the procedure.  Do not take aspirin or any aspirin-containing medications, at least eleven (11) days prior to the procedure.  They may prolong bleeding.  Wear loose fitting clothing that may be easy to take off and that you would not mind if it got stained with Betadine or blood.  Do not wear any jewelry or perfume  Remove any nail coloring.  It will interfere with some of our monitoring equipment.  NOTE: Remember that this is not meant to be interpreted as a complete list of all possible complications.  Unforeseen problems may occur.  BLOOD THINNERS The following drugs contain aspirin or other products, which can cause increased bleeding during surgery and should not be taken for 2 weeks prior to and 1 week after surgery.  If you should need take something for relief of minor pain, you may take acetaminophen which is found in Tylenol,m Datril, Anacin-3 and Panadol. It is not blood thinner. The products listed below are.  Do not take any of the products listed below in addition to any listed on your instruction sheet.  A.P.C or A.P.C with Codeine Codeine Phosphate Capsules #3 Ibuprofen Ridaura  ABC compound Congesprin Imuran rimadil  Advil Cope Indocin Robaxisal  Alka-Seltzer Effervescent Pain Reliever and Antacid Coricidin or Coricidin-D  Indomethacin Rufen  Alka-Seltzer plus Cold Medicine Cosprin Ketoprofen S-A-C Tablets  Anacin Analgesic Tablets or Capsules Coumadin Korlgesic Salflex  Anacin Extra Strength Analgesic tablets or capsules CP-2 Tablets Lanoril Salicylate  Anaprox Cuprimine Capsules Levenox Salocol  Anexsia-D Dalteparin Magan Salsalate  Anodynos Darvon compound Magnesium Salicylate Sine-off  Ansaid Dasin Capsules Magsal Sodium Salicylate  Anturane Depen Capsules Marnal Soma  APF Arthritis pain formula Dewitt's Pills Measurin Stanback  Argesic Dia-Gesic  Meclofenamic Sulfinpyrazone  Arthritis Bayer Timed Release Aspirin Diclofenac Meclomen Sulindac  Arthritis pain formula Anacin Dicumarol Medipren Supac  Analgesic (Safety coated) Arthralgen Diffunasal Mefanamic Suprofen  Arthritis Strength Bufferin Dihydrocodeine Mepro Compound Suprol  Arthropan liquid Dopirydamole Methcarbomol with Aspirin Synalgos  ASA tablets/Enseals Disalcid Micrainin Tagament  Ascriptin Doan's Midol Talwin  Ascriptin A/D Dolene Mobidin Tanderil  Ascriptin Extra Strength Dolobid Moblgesic Ticlid  Ascriptin with Codeine Doloprin or Doloprin with Codeine Momentum Tolectin  Asperbuf Duoprin Mono-gesic Trendar  Aspergum Duradyne Motrin or Motrin IB Triminicin  Aspirin plain, buffered or enteric coated Durasal Myochrisine Trigesic  Aspirin Suppositories Easprin Nalfon Trillsate  Aspirin with Codeine Ecotrin Regular or Extra Strength Naprosyn Uracel  Atromid-S Efficin Naproxen Ursinus  Auranofin Capsules Elmiron Neocylate Vanquish  Axotal Emagrin Norgesic Verin  Azathioprine Empirin or Empirin with Codeine Normiflo Vitamin E  Azolid Emprazil Nuprin Voltaren  Bayer Aspirin plain, buffered or children's or timed BC Tablets or powders Encaprin Orgaran Warfarin Sodium  Buff-a-Comp Enoxaparin Orudis Zorpin  Buff-a-Comp with Codeine Equegesic Os-Cal-Gesic   Buffaprin Excedrin plain, buffered or Extra Strength Oxalid   Bufferin Arthritis Strength Feldene Oxphenbutazone   Bufferin plain or Extra Strength Feldene Capsules Oxycodone with Aspirin   Bufferin with Codeine Fenoprofen Fenoprofen Pabalate or Pabalate-SF   Buffets II Flogesic Panagesic   Buffinol plain or  Extra Strength Florinal or Florinal with Codeine Panwarfarin   Buf-Tabs Flurbiprofen Penicillamine   Butalbital Compound Four-way cold tablets Penicillin   Butazolidin Fragmin Pepto-Bismol   Carbenicillin Geminisyn Percodan   Carna Arthritis Reliever Geopen Persantine   Carprofen Gold's salt Persistin     Chloramphenicol Goody's Phenylbutazone   Chloromycetin Haltrain Piroxlcam   Clmetidine heparin Plaquenil   Cllnoril Hyco-pap Ponstel   Clofibrate Hydroxy chloroquine Propoxyphen         Before stopping any of these medications, be sure to consult the physician who ordered them.  Some, such as Coumadin (Warfarin) are ordered to prevent or treat serious conditions such as "deep thrombosis", "pumonary embolisms", and other heart problems.  The amount of time that you may need off of the medication may also vary with the medication and the reason for which you were taking it.  If you are taking any of these medications, please make sure you notify your pain physician before you undergo any procedures.          Facet Joint Block The facet joints connect the bones of the spine (vertebrae). They make it possible for you to bend, twist, and make other movements with your spine. They also keep you from bending too far, twisting too far, and making other excessive movements. A facet joint block is a procedure where a numbing medicine (anesthetic) is injected into a facet joint. Often, a type of anti-inflammatory medicine called a steroid is also injected. A facet joint block may be done to diagnose neck or back pain. If the pain gets better after a facet joint block, it means the pain is probably coming from the facet joint. If the pain does not get better, it means the pain is probably not coming from the facet joint. A facet joint block may also be done to relieve neck or back pain caused by an inflamed facet joint. A facet joint block is only done to relieve pain if the pain does not improve with other methods, such as medicine, exercise programs, and physical therapy. Tell a health care provider about:  Any allergies you have.  All medicines you are taking, including vitamins, herbs, eye drops, creams, and over-the-counter medicines.  Any problems you or family members have had with anesthetic  medicines.  Any blood disorders you have.  Any surgeries you have had.  Any medical conditions you have.  Whether you are pregnant or may be pregnant. What are the risks? Generally, this is a safe procedure. However, problems may occur, including:  Bleeding.  Injury to a nerve near the injection site.  Pain at the injection site.  Weakness or numbness in areas controlled by nerves near the injection site.  Infection.  Temporary fluid retention.  Allergic reactions to medicines or dyes.  Injury to other structures or organs near the injection site.  What happens before the procedure?  Follow instructions from your health care provider about eating or drinking restrictions.  Ask your health care provider about: ? Changing or stopping your regular medicines. This is especially important if you are taking diabetes medicines or blood thinners. ? Taking medicines such as aspirin and ibuprofen. These medicines can thin your blood. Do not take these medicines before your procedure if your health care provider instructs you not to.  Do not take any new dietary supplements or medicines without asking your health care provider first.  Plan to have someone take you home after the procedure. What happens during the procedure?  You  may need to remove your clothing and dress in an open-back gown.  The procedure will be done while you are lying on an X-ray table. You will most likely be asked to lie on your stomach, but you may be asked to lie in a different position if an injection will be made in your neck.  Machines will be used to monitor your oxygen levels, heart rate, and blood pressure.  If an injection will be made in your neck, an IV tube will be inserted into one of your veins. Fluids and medicine will flow directly into your body through the IV tube.  The area over the facet joint where the injection will be made will be cleaned with soap. The surrounding skin will be  covered with clean drapes.  A numbing medicine (local anesthetic) will be applied to your skin. Your skin may sting or burn for a moment.  A video X-ray machine (fluoroscopy) will be used to locate the joint. In some cases, a CT scan may be used.  A contrast dye may be injected into the facet joint area to help locate the joint.  When the joint is located, an anesthetic will be injected into the joint through the needle.  Your health care provider will ask you whether you feel pain relief. If you do feel relief, a steroid may be injected to provide pain relief for a longer period of time. If you do not feel relief or feel only partial relief, additional injections of an anesthetic may be made in other facet joints.  The needle will be removed.  Your skin will be cleaned.  A bandage (dressing) will be applied over each injection site. The procedure may vary among health care providers and hospitals. What happens after the procedure?  You will be observed for 15-30 minutes before being allowed to go home. This information is not intended to replace advice given to you by your health care provider. Make sure you discuss any questions you have with your health care provider. Document Released: 03/14/2007 Document Revised: 11/24/2015 Document Reviewed: 07/19/2015 Elsevier Interactive Patient Education  [employer].

## 2017-07-17 ENCOUNTER — Ambulatory Visit
Admission: RE | Admit: 2017-07-17 | Discharge: 2017-07-17 | Disposition: A | Discharge status: Home / Self Care | Payer: BLUE CROSS/BLUE SHIELD | Source: Ambulatory Visit | Attending: Pain Medicine | Admitting: Pain Medicine

## 2017-07-17 ENCOUNTER — Encounter: Payer: Self-pay | Admitting: Pain Medicine

## 2017-07-17 ENCOUNTER — Ambulatory Visit (HOSPITAL_BASED_OUTPATIENT_CLINIC_OR_DEPARTMENT_OTHER): Payer: BLUE CROSS/BLUE SHIELD | Admitting: Pain Medicine

## 2017-07-17 VITALS — BP 125/80 | HR 73 | Temp 97.3°F | Resp 14 | Ht 64.0 in | Wt 158.0 lb

## 2017-07-17 DIAGNOSIS — R0789 Other chest pain: Secondary | ICD-10-CM | POA: Diagnosis not present

## 2017-07-17 DIAGNOSIS — M25511 Pain in right shoulder: Secondary | ICD-10-CM | POA: Insufficient documentation

## 2017-07-17 DIAGNOSIS — R51 Headache: Secondary | ICD-10-CM | POA: Insufficient documentation

## 2017-07-17 DIAGNOSIS — M4692 Unspecified inflammatory spondylopathy, cervical region: Secondary | ICD-10-CM | POA: Diagnosis not present

## 2017-07-17 DIAGNOSIS — M47812 Spondylosis without myelopathy or radiculopathy, cervical region: Secondary | ICD-10-CM | POA: Diagnosis not present

## 2017-07-17 DIAGNOSIS — M542 Cervicalgia: Secondary | ICD-10-CM | POA: Insufficient documentation

## 2017-07-17 DIAGNOSIS — M25512 Pain in left shoulder: Secondary | ICD-10-CM | POA: Insufficient documentation

## 2017-07-17 DIAGNOSIS — G8929 Other chronic pain: Secondary | ICD-10-CM

## 2017-07-17 MED ORDER — LIDOCAINE HCL 2 % IJ SOLN
10.0000 mL | Freq: Once | INTRAMUSCULAR | Status: AC
  Administered 2017-07-17: 400 mg
  Filled 2017-07-17: qty 20

## 2017-07-17 MED ORDER — DEXAMETHASONE SODIUM PHOSPHATE 10 MG/ML IJ SOLN
10.0000 mg | Freq: Once | INTRAMUSCULAR | Status: AC
  Administered 2017-07-17: 10 mg
  Filled 2017-07-17: qty 1

## 2017-07-17 MED ORDER — ROPIVACAINE HCL 2 MG/ML IJ SOLN
9.0000 mL | Freq: Once | INTRAMUSCULAR | Status: AC
  Administered 2017-07-17: 10 mL via PERINEURAL
  Filled 2017-07-17: qty 10

## 2017-07-17 MED ORDER — ROPIVACAINE HCL 2 MG/ML IJ SOLN
9.0000 mL | Freq: Once | INTRAMUSCULAR | Status: AC
  Administered 2017-07-17: 9 mL via PERINEURAL
  Filled 2017-07-17: qty 10

## 2017-07-17 MED ORDER — FENTANYL CITRATE (PF) 100 MCG/2ML IJ SOLN
25.0000 ug | INTRAMUSCULAR | Status: DC | PRN
Start: 2017-07-17 — End: 2017-07-17
  Administered 2017-07-17: 100 ug via INTRAVENOUS
  Filled 2017-07-17: qty 2

## 2017-07-17 MED ORDER — LACTATED RINGERS IV SOLN
1000.0000 mL | Freq: Once | INTRAVENOUS | Status: AC
  Administered 2017-07-17: 1000 mL via INTRAVENOUS

## 2017-07-17 MED ORDER — MIDAZOLAM HCL 5 MG/5ML IJ SOLN
1.0000 mg | INTRAMUSCULAR | Status: DC | PRN
  Administered 2017-07-17: 4 mg via INTRAVENOUS
  Filled 2017-07-17: qty 5

## 2017-07-17 NOTE — Progress Notes (Signed)
Patient's Name: Nicole Walter  MRN: [id number]  Referring Provider: Leone Haven, MD  DOB: [birth year]  PCP: Leone Haven, MD  DOS: 07/17/2017  Note by: Gaspar Cola, MD  Service setting: Ambulatory outpatient  Specialty: Interventional Pain Management  Patient type: Established  Location: [facility] (AMB) Pain Management Facility  Visit type: Interventional Procedure   Primary Reason for Visit: Interventional Pain Management Treatment. CC: Neck Pain (both); Shoulder Pain (left more than right); Chest Pain (upper); and Headache (back of head)  Procedure:  Anesthesia, Analgesia, Anxiolysis:  Type: Diagnostic Cervical Facet Medial Branch Block(s) Region: Posterolateral cervical spine region Level: C3, C4, C5, C6, & C7 Medial Branch Level(s) Laterality: Bilateral Paraspinal  Type: Local Anesthesia with Moderate (Conscious) Sedation Local Anesthetic: Lidocaine 1% Route: Intravenous (IV) IV Access: Secured Sedation: Meaningful verbal contact was maintained at all times during the procedure  Indication(s): Analgesia and Anxiety  Indications: 1. Cervical facet syndrome (Bilateral) (L>R)   2. Cervical spondylosis (Bilateral)   3. Chronic neck pain (Location of Primary Source of Pain) (Bilateral) (L>R)    Pain Score: Pre-procedure: 2 /10 Post-procedure: 0-No pain/10  Pre-op Assessment:  Ms. Kotula is a 56 y.o. (year old), female patient, seen today for interventional treatment. She  has a past surgical history that includes Cholecystectomy; Tubal ligation; Ankle surgery; Nasal sinus surgery; Tonsillectomy; Colonoscopy; Esophagogastroduodenoscopy (egd) with propofol (N/A, 02/22/2016); Colonoscopy with propofol (N/A, 02/22/2016); and Breast biopsy (Left, 1997). Ms. Iturralde has a current medication list which includes the following prescription(s): calcium carbonate-vit d-min, vitamin d, fluocinonide ointment, fluticasone, hydroxyzine, lamotrigine, lubiprostone, multi-vitamins, oxycodone,  oxycodone, rabeprazole, saxenda, simvastatin, sucralfate, and trintellix, and the following Facility-Administered Medications: fentanyl and midazolam. Her primarily concern today is the Neck Pain (both); Shoulder Pain (left more than right); Chest Pain (upper); and Headache (back of head)  Initial Vital Signs: Blood pressure 120/74, pulse 70, temperature 98.3 F (36.8 C), resp. rate 16, height 5\' 4"  (1.626 m), weight 158 lb (71.7 kg), SpO2 100 %. BMI: Estimated body mass index is 27.12 kg/m as calculated from the following:   Height as of this encounter: 5\' 4"  (1.626 m).   Weight as of this encounter: 158 lb (71.7 kg).  Risk Assessment: Allergies: Reviewed. She is allergic to other and food.  Allergy Precautions: None required Coagulopathies: Reviewed. None identified.  Blood-thinner therapy: None at this time Active Infection(s): Reviewed. None identified. Ms. Salomone is afebrile  Site Confirmation: Ms. Glandon was asked to confirm the procedure and laterality before marking the site Procedure checklist: Completed Consent: Before the procedure and under the influence of no sedative(s), amnesic(s), or anxiolytics, the patient was informed of the treatment options, risks and possible complications. To fulfill our ethical and legal obligations, as recommended by the American Medical Association's Code of Ethics, I have informed the patient of my clinical impression; the nature and purpose of the treatment or procedure; the risks, benefits, and possible complications of the intervention; the alternatives, including doing nothing; the risk(s) and benefit(s) of the alternative treatment(s) or procedure(s); and the risk(s) and benefit(s) of doing nothing. The patient was provided information about the general risks and possible complications associated with the procedure. These may include, but are not limited to: failure to achieve desired goals, infection, bleeding, organ or nerve damage, allergic  reactions, paralysis, and death. In addition, the patient was informed of those risks and complications associated to Spine-related procedures, such as failure to decrease pain; infection (i.e.: Meningitis, epidural or intraspinal abscess); bleeding (i.e.: epidural hematoma, subarachnoid  hemorrhage, or any other type of intraspinal or peri-dural bleeding); organ or nerve damage (i.e.: Any type of peripheral nerve, nerve root, or spinal cord injury) with subsequent damage to sensory, motor, and/or autonomic systems, resulting in permanent pain, numbness, and/or weakness of one or several areas of the body; allergic reactions; (i.e.: anaphylactic reaction); and/or death. Furthermore, the patient was informed of those risks and complications associated with the medications. These include, but are not limited to: allergic reactions (i.e.: anaphylactic or anaphylactoid reaction(s)); adrenal axis suppression; blood sugar elevation that in diabetics may result in ketoacidosis or comma; water retention that in patients with history of congestive heart failure may result in shortness of breath, pulmonary edema, and decompensation with resultant heart failure; weight gain; swelling or edema; medication-induced neural toxicity; particulate matter embolism and blood vessel occlusion with resultant organ, and/or nervous system infarction; and/or aseptic necrosis of one or more joints. Finally, the patient was informed that Medicine is not an exact science; therefore, there is also the possibility of unforeseen or unpredictable risks and/or possible complications that may result in a catastrophic outcome. The patient indicated having understood very clearly. We have given the patient no guarantees and we have made no promises. Enough time was given to the patient to ask questions, all of which were answered to the patient's satisfaction. Ms. Breunig has indicated that she wanted to continue with the procedure. Attestation: I,  the ordering provider, attest that I have discussed with the patient the benefits, risks, side-effects, alternatives, likelihood of achieving goals, and potential problems during recovery for the procedure that I have provided informed consent. Date: 07/17/2017; Time: 9:48 AM  Pre-Procedure Preparation:  Monitoring: As per clinic protocol. Respiration, ETCO2, SpO2, BP, heart rate and rhythm monitor placed and checked for adequate function Safety Precautions: Patient was assessed for positional comfort and pressure points before starting the procedure. Time-out: I initiated and conducted the "Time-out" before starting the procedure, as per protocol. The patient was asked to participate by confirming the accuracy of the "Time Out" information. Verification of the correct person, site, and procedure were performed and confirmed by me, the nursing staff, and the patient. "Time-out" conducted as per Joint Commission's Universal Protocol (UP.01.01.01). "Time-out" Date & Time: 07/17/2017; 1030 hrs.  Description of Procedure Process:   Position: Prone with head of the table was raised to facilitate breathing. Target Area: For Cervical Facet blocks, the target is the postero-lateral waist of the articular pillars at the C3, C4, C5, C6, & C7 levels. Approach: Posterior approach. Area Prepped: Entire Posterior Cervico-thoracic Region Prepping solution: ChloraPrep (2% chlorhexidine gluconate and 70% isopropyl alcohol) Safety Precautions: Aspiration looking for blood return was conducted prior to all injections. At no point did we inject any substances, as a needle was being advanced. No attempts were made at seeking any paresthesias. Safe injection practices and needle disposal techniques used. Medications properly checked for expiration dates. SDV (single dose vial) medications used. Description of the Procedure: Protocol guidelines were followed. The patient was placed in position over the fluoroscopy table. The  target area was identified and the area prepped in the usual manner. Skin desensitized using vapocoolant spray. Skin & deeper tissues infiltrated with local anesthetic. Appropriate amount of time allowed to pass for local anesthetics to take effect. The procedure needle was introduced through the skin, ipsilateral to the reported pain, and advanced to the target area. Bone was contacted on the posterior aspect of the articular pillars and the needle walked lateral, until the border was  cleared. Lateral views taken to make sure the needle tip did not advance past the posterior third of the lateral mass of the posterior columns. The procedure was repeated in identical fashion for each level. Negative aspiration confirmed. Solution injected in intermittent fashion, asking for systemic symptoms every 0.5cc of injectate. The needles were then removed and the area cleansed, making sure to leave some of the prepping solution back to take advantage of its long term bactericidal properties. Vitals:   07/17/17 1044 07/17/17 1054 07/17/17 1104 07/17/17 1114  BP: (!) 119/91 133/77 122/72 125/80  Pulse:      Resp: 13 11 (!) 21 14  Temp:  (!) 97.3 F (36.3 C)    SpO2: 93% 97% 99% 99%  Weight:      Height:        Start Time: 1030 hrs. End Time: 1044 hrs. Materials:  Needle(s) Type: Regular needle Gauge: 22G Length: 3.5-in Medication(s): We administered lactated ringers, midazolam, fentaNYL, lidocaine, dexamethasone, ropivacaine (PF) 2 mg/mL (0.2%), dexamethasone, and ropivacaine (PF) 2 mg/mL (0.2%). Please see chart orders for dosing details.  Imaging Guidance (Spinal):  Type of Imaging Technique: Fluoroscopy Guidance (Spinal) Indication(s): Assistance in needle guidance and placement for procedures requiring needle placement in or near specific anatomical locations not easily accessible without such assistance. Exposure Time: Please see nurses notes. Contrast: None used. Fluoroscopic Guidance: I was  personally present during the use of fluoroscopy. "Tunnel Vision Technique" used to obtain the best possible view of the target area. Parallax error corrected before commencing the procedure. "Direction-depth-direction" technique used to introduce the needle under continuous pulsed fluoroscopy. Once target was reached, antero-posterior, oblique, and lateral fluoroscopic projection used confirm needle placement in all planes. Images permanently stored in EMR. Interpretation: No contrast injected. I personally interpreted the imaging intraoperatively. Adequate needle placement confirmed in multiple planes. Permanent images saved into the patient's record.  Antibiotic Prophylaxis:  Indication(s): None identified Antibiotic given: None  Post-operative Assessment:  EBL: None Complications: No immediate post-treatment complications observed by team, or reported by patient. Note: The patient tolerated the entire procedure well. A repeat set of vitals were taken after the procedure and the patient was kept under observation following institutional policy, for this type of procedure. Post-procedural neurological assessment was performed, showing return to baseline, prior to discharge. The patient was provided with post-procedure discharge instructions, including a section on how to identify potential problems. Should any problems arise concerning this procedure, the patient was given instructions to immediately contact us, at any time, without hesitation. In any case, we plan to contact the patient by telephone for a follow-up status report regarding this interventional procedure. Comments:  No additional relevant information.  Plan of Care  Disposition: Discharge home  Discharge Date & Time: 07/17/2017; 1115 hrs.   Physician-requested Follow-up:  Return for post-procedure eval by Dr. Dossie Arbour in 2 weeks.  Future Appointments Date Time Provider [place]  07/19/2017 9:15 AM Leone Haven, MD  [facility] None  07/30/2017 9:30 AM Gardiner Barefoot, DPM [facility]  08/06/2017 1:45 PM Milinda Pointer, MD [facility] None  09/10/2017 9:00 AM Vevelyn Francois, NP [facility] None    Imaging Orders     DG C-Arm 1-60 Min-No Report  Procedure Orders     CERVICAL FACET (MEDIAL BRANCH NERVE BLOCK)   Medications ordered for procedure: Meds ordered this encounter  Medications  . lactated ringers infusion 1,000 mL  . midazolam (VERSED) 5 MG/5ML injection 1-2 mg    Make sure Flumazenil is available  in the pyxis when using this medication. If oversedation occurs, administer 0.2 mg IV over 15 sec. If after 45 sec no response, administer 0.2 mg again over 1 min; may repeat at 1 min intervals; not to exceed 4 doses (1 mg)  . fentaNYL (SUBLIMAZE) injection 25-50 mcg    Make sure Narcan is available in the pyxis when using this medication. In the event of respiratory depression (RR< 8/min): Titrate NARCAN (naloxone) in increments of 0.1 to 0.2 mg IV at 2-3 minute intervals, until desired degree of reversal.  . lidocaine (XYLOCAINE) 2 % (with pres) injection 200 mg  . dexamethasone (DECADRON) injection 10 mg  . ropivacaine (PF) 2 mg/mL (0.2%) (NAROPIN) injection 9 mL  . dexamethasone (DECADRON) injection 10 mg  . ropivacaine (PF) 2 mg/mL (0.2%) (NAROPIN) injection 9 mL   Medications administered: We administered lactated ringers, midazolam, fentaNYL, lidocaine, dexamethasone, ropivacaine (PF) 2 mg/mL (0.2%), dexamethasone, and ropivacaine (PF) 2 mg/mL (0.2%).  See the medical record for exact dosing, route, and time of administration.  New Prescriptions   No medications on file   Primary Care Physician: Leone Haven, MD Location: [facility] Note by: Gaspar Cola, MD Date: 07/17/2017; Time: 1:14 PM  Disclaimer:  Medicine is not an Chief Strategy Officer. The only guarantee in medicine is that nothing is guaranteed. It is important to note that the  decision to proceed with this intervention was based on the information collected from the patient. The Data and conclusions were drawn from the patient's questionnaire, the interview, and the physical examination. Because the information was provided in large part by the patient, it cannot be guaranteed that it has not been purposely or unconsciously manipulated. Every effort has been made to obtain as much relevant data as possible for this evaluation. It is important to note that the conclusions that lead to this procedure are derived in large part from the available data. Always take into account that the treatment will also be dependent on availability of resources and existing treatment guidelines, considered by other Pain Management Practitioners as being common knowledge and practice, at the time of the intervention. For Medico-Legal purposes, it is also important to point out that variation in procedural techniques and pharmacological choices are the acceptable norm. The indications, contraindications, technique, and results of the above procedure should only be interpreted and judged by a Board-Certified Interventional Pain Specialist with extensive familiarity and expertise in the same exact procedure and technique.

## 2017-07-17 NOTE — Progress Notes (Signed)
Safety precautions to be maintained throughout the outpatient stay will include: orient to surroundings, keep bed in low position, maintain call bell within reach at all times, provide assistance with transfer out of bed and ambulation.  

## 2017-07-17 NOTE — Patient Instructions (Addendum)
____________________________________________________________________________________________  Post-Procedure instructions Instructions:  Apply ice: Fill a plastic sandwich bag with crushed ice. Cover it with a small towel and apply to injection site. Apply for 15 minutes then remove x 15 minutes. Repeat sequence on day of procedure, until you go to bed. The purpose is to minimize swelling and discomfort after procedure.  Apply heat: Apply heat to procedure site starting the day following the procedure. The purpose is to treat any soreness and discomfort from the procedure.  Food intake: Start with clear liquids (like water) and advance to regular food, as tolerated.   Physical activities: Keep activities to a minimum for the first 8 hours after the procedure.   Driving: If you have received any sedation, you are not allowed to drive for 24 hours after your procedure.  Blood thinner: Restart your blood thinner 6 hours after your procedure. (Only for those taking blood thinners)  Insulin: As soon as you can eat, you may resume your normal dosing schedule. (Only for those taking insulin)  Infection prevention: Keep procedure site clean and dry.  Post-procedure Pain Diary: Extremely important that this be done correctly and accurately. Recorded information will be used to determine the next step in treatment.  Pain evaluated is that of treated area only. Do not include pain from an untreated area.  Complete every hour, on the hour, for the initial 8 hours. Set an alarm to help you do this part accurately.  Do not go to sleep and have it completed later. It will not be accurate.  Follow-up appointment: Keep your follow-up appointment after the procedure. Usually 2 weeks for most procedures. (6 weeks in the case of radiofrequency.) Bring you pain diary.  Expect:  From numbing medicine (AKA: Local Anesthetics): Numbness or decrease in pain.  Onset: Full effect within 15 minutes of  injected.  Duration: It will depend on the type of local anesthetic used. On the average, 1 to 8 hours.   From steroids: Decrease in swelling or inflammation. Once inflammation is improved, relief of the pain will follow.  Onset of benefits: Depends on the amount of swelling present. The more swelling, the longer it will take for the benefits to be seen. In some cases, up to 10 days.  Duration: Steroids will stay in the system x 2 weeks. Duration of benefits will depend on multiple posibilities including persistent irritating factors.  From procedure: Some discomfort is to be expected once the numbing medicine wears off. This should be minimal if ice and heat are applied as instructed. Call if:  You experience numbness and weakness that gets worse with time, as opposed to wearing off.  New onset bowel or bladder incontinence. (Spinal procedures only)  Emergency Numbers:  Durning business hours (Monday - Thursday, 8:00 AM - 4:00 PM) (Friday, 9:00 AM - 12:00 Noon): [phone]  After hours: [phone] ____________________________________________________________________________________________  Pain Management Discharge Instructions  General Discharge Instructions :  If you need to reach your doctor call: Monday-Friday 8:00 am - 4:00 pm at [phone] or toll free [phone].  After clinic hours [phone] to have operator reach doctor.  Bring all of your medication bottles to all your appointments in the pain clinic.  To cancel or reschedule your appointment with Pain Management please remember to call 24 hours in advance to avoid a fee.  Refer to the educational materials which you have been given on: General Risks, I had my Procedure. Discharge Instructions, Post Sedation.  Post Procedure Instructions:  The drugs you   were given will stay in your system until tomorrow, so for the next 24 hours you should not drive, make any legal decisions or drink any alcoholic  beverages.  You may eat anything you prefer, but it is better to start with liquids then soups and crackers, and gradually work up to solid foods.  Please notify your doctor immediately if you have any unusual bleeding, trouble breathing or pain that is not related to your normal pain.  Depending on the type of procedure that was done, some parts of your body may feel week and/or numb.  This usually clears up by tonight or the next day.  Walk with the use of an assistive device or accompanied by an adult for the 24 hours.  You may use ice on the affected area for the first 24 hours.  Put ice in a Ziploc bag and cover with a towel and place against area 15 minutes on 15 minutes off.  You may switch to heat after 24 hours. Facet Joint Block The facet joints connect the bones of the spine (vertebrae). They make it possible for you to bend, twist, and make other movements with your spine. They also keep you from bending too far, twisting too far, and making other excessive movements. A facet joint block is a procedure where a numbing medicine (anesthetic) is injected into a facet joint. Often, a type of anti-inflammatory medicine called a steroid is also injected. A facet joint block may be done to diagnose neck or back pain. If the pain gets better after a facet joint block, it means the pain is probably coming from the facet joint. If the pain does not get better, it means the pain is probably not coming from the facet joint. A facet joint block may also be done to relieve neck or back pain caused by an inflamed facet joint. A facet joint block is only done to relieve pain if the pain does not improve with other methods, such as medicine, exercise programs, and physical therapy. Tell a health care provider about:  Any allergies you have.  All medicines you are taking, including vitamins, herbs, eye drops, creams, and over-the-counter medicines.  Any problems you or family members have had with  anesthetic medicines.  Any blood disorders you have.  Any surgeries you have had.  Any medical conditions you have.  Whether you are pregnant or may be pregnant. What are the risks? Generally, this is a safe procedure. However, problems may occur, including:  Bleeding.  Injury to a nerve near the injection site.  Pain at the injection site.  Weakness or numbness in areas controlled by nerves near the injection site.  Infection.  Temporary fluid retention.  Allergic reactions to medicines or dyes.  Injury to other structures or organs near the injection site.  What happens before the procedure?  Follow instructions from your health care provider about eating or drinking restrictions.  Ask your health care provider about: ? Changing or stopping your regular medicines. This is especially important if you are taking diabetes medicines or blood thinners. ? Taking medicines such as aspirin and ibuprofen. These medicines can thin your blood. Do not take these medicines before your procedure if your health care provider instructs you not to.  Do not take any new dietary supplements or medicines without asking your health care provider first.  Plan to have someone take you home after the procedure. What happens during the procedure?  You may need to remove your   clothing and dress in an open-back gown.  The procedure will be done while you are lying on an X-ray table. You will most likely be asked to lie on your stomach, but you may be asked to lie in a different position if an injection will be made in your neck.  Machines will be used to monitor your oxygen levels, heart rate, and blood pressure.  If an injection will be made in your neck, an IV tube will be inserted into one of your veins. Fluids and medicine will flow directly into your body through the IV tube.  The area over the facet joint where the injection will be made will be cleaned with soap. The surrounding skin  will be covered with clean drapes.  A numbing medicine (local anesthetic) will be applied to your skin. Your skin may sting or burn for a moment.  A video X-ray machine (fluoroscopy) will be used to locate the joint. In some cases, a CT scan may be used.  A contrast dye may be injected into the facet joint area to help locate the joint.  When the joint is located, an anesthetic will be injected into the joint through the needle.  Your health care provider will ask you whether you feel pain relief. If you do feel relief, a steroid may be injected to provide pain relief for a longer period of time. If you do not feel relief or feel only partial relief, additional injections of an anesthetic may be made in other facet joints.  The needle will be removed.  Your skin will be cleaned.  A bandage (dressing) will be applied over each injection site. The procedure may vary among health care providers and hospitals. What happens after the procedure?  You will be observed for 15-30 minutes before being allowed to go home. This information is not intended to replace advice given to you by your health care provider. Make sure you discuss any questions you have with your health care provider. Document Released: 03/14/2007 Document Revised: 11/24/2015 Document Reviewed: 07/19/2015 Elsevier Interactive Patient Education  2018 [employer]  Facet Joint Block, Care After Refer to this sheet in the next few weeks. These instructions provide you with information about caring for yourself after your procedure. Your health care provider may also give you more specific instructions. Your treatment has been planned according to current medical practices, but problems sometimes occur. Call your health care provider if you have any problems or questions after your procedure. What can I expect after the procedure? After the procedure, it is common to have:  Some tenderness over the injection sites for 2 days  after the procedure.  A temporary increase in blood sugar if you have diabetes.  Follow these instructions at home:  Keep track of the amount of pain relief you feel and how long it lasts.  Take over-the-counter and prescription medicines only as told by your health care provider. You may need to limit pain medicine within the first 4-6 hours after the procedure.  Remove your bandages (dressings) the morning after the procedure.  For the first 24 hours after the procedure: ? Do not apply heat near or over the injection sites. ? Do not take a bath or soak in water, such as in a pool or lake. ? Do not drive or operate heavy machinery unless approved by your health care provider. ? Avoid activities that require a lot of energy.  If the injection site is tender, try applying ice   to the area. To do this: ? Put ice in a plastic bag. ? Place a towel between your skin and the bag. ? Leave the ice on for 20 minutes, 2-3 times a day.  Keep all follow-up visits as told by your health care provider. This is important. Contact a health care provider if:  Fluid is coming from an injection site.  There is significant bleeding or swelling at an injection site.  You have diabetes and your blood sugar is above 180 mg/dL. Get help right away if:  You have a fever.  You have worsening pain or swelling around an injection site.  There are red streaks around an injection site.  You develop severe pain that is not controlled by your medicines.  You develop a headache, stiff neck, nausea, or vomiting.  Your eyes become very sensitive to light.  You have weakness, paralysis, or tingling in your arms or legs that was not present before the procedure.  You have difficulty urinating or breathing. This information is not intended to replace advice given to you by your health care provider. Make sure you discuss any questions you have with your health care provider. Document Released: 10/09/2012  Document Revised: 03/08/2016 Document Reviewed: 07/19/2015 Elsevier Interactive Patient Education  2018 [employer]  

## 2017-07-17 NOTE — Progress Notes (Signed)
Patient brought back 2 prescriptions in exchange for the new correct dates.

## 2017-07-18 ENCOUNTER — Telehealth: Payer: Self-pay

## 2017-07-18 NOTE — Telephone Encounter (Signed)
Unable to reach pt. No answer machine.

## 2017-07-19 ENCOUNTER — Encounter: Payer: Self-pay | Admitting: Family Medicine

## 2017-07-19 ENCOUNTER — Ambulatory Visit: Payer: BLUE CROSS/BLUE SHIELD | Admitting: Podiatry

## 2017-07-19 ENCOUNTER — Ambulatory Visit (INDEPENDENT_AMBULATORY_CARE_PROVIDER_SITE_OTHER): Payer: BLUE CROSS/BLUE SHIELD | Admitting: Family Medicine

## 2017-07-19 VITALS — BP 110/72 | HR 72 | Temp 98.0°F | Ht 64.0 in | Wt 157.2 lb

## 2017-07-19 DIAGNOSIS — K5900 Constipation, unspecified: Secondary | ICD-10-CM

## 2017-07-19 DIAGNOSIS — E663 Overweight: Secondary | ICD-10-CM

## 2017-07-19 LAB — LIPID PANEL
CHOLESTEROL: 139 mg/dL (ref 0–200)
HDL: 61.8 mg/dL (ref >39.00)
LDL Cholesterol: 54 mg/dL (ref 0–99)
NonHDL: 77.69
TRIGLYCERIDES: 118 mg/dL (ref 0.0–149.0)
Total CHOL/HDL Ratio: 2
VLDL: 23.6 mg/dL (ref 0.0–40.0)

## 2017-07-19 LAB — COMPREHENSIVE METABOLIC PANEL
ALK PHOS: 58 U/L (ref 39–117)
ALT: 20 U/L (ref 0–35)
AST: 21 U/L (ref 0–37)
Albumin: 4.3 g/dL (ref 3.5–5.2)
BILIRUBIN TOTAL: 0.4 mg/dL (ref 0.2–1.2)
BUN: 11 mg/dL (ref 6–23)
CO2: 30 meq/L (ref 19–32)
Calcium: 9.5 mg/dL (ref 8.4–10.5)
Chloride: 105 mEq/L (ref 96–112)
Creatinine, Ser: 0.96 mg/dL (ref 0.40–1.20)
GFR: 64.07 mL/min (ref >60.00)
GLUCOSE: 90 mg/dL (ref 70–99)
Potassium: 3.6 mEq/L (ref 3.5–5.1)
SODIUM: 141 meq/L (ref 135–145)
Total Protein: 6.6 g/dL (ref 6.0–8.3)

## 2017-07-19 LAB — HEMOGLOBIN A1C: HEMOGLOBIN A1C: 5.5 % (ref 4.6–6.5)

## 2017-07-19 MED ORDER — SAXENDA 18 MG/3ML ~~LOC~~ SOPN: 3.0000 mg | PEN_INJECTOR | Freq: Every day | SUBCUTANEOUS | 1 refills | Status: DC

## 2017-07-19 NOTE — Progress Notes (Signed)
  Tommi Rumps, MD Phone: [phone]  Nicole Walter is a 56 y.o. female who presents today for follow-up.  Overweight: Patient recently started on Saxenda for weight loss as she had stalled. She met criteria for this given her BMI and hyperlipidemia. She continues to work on dietary changes and eats fairly healthily. She's going to the gym 3-4 times a week for an hour at a time. She had some nausea with the Saxenda initially though this resolved. She's currently on the 3 mg dose. No personal or family history of medullary thyroid carcinoma or MEN2 cancers  She saw her pain management physician a couple of days ago and they started her on amitiza for constipation. She had begun to have a little bit of pressure in her stomach that she has had when she is constipated previously. She had a good bowel movement yesterday and feels quite a bit better though has slight soreness in her stomach.  PMH: Former smoker   ROS see history of present illness  Objective  Physical Exam Vitals:   07/19/17 0906  BP: 110/72  Pulse: 72  Temp: 98 F (36.7 C)  SpO2: 97%    BP Readings from Last 3 Encounters:  07/19/17 110/72  07/17/17 125/80  07/16/17 121/72   Wt Readings from Last 3 Encounters:  07/19/17 157 lb 3.2 oz (71.3 kg)  07/17/17 158 lb (71.7 kg)  07/16/17 158 lb (71.7 kg)    Physical Exam  Constitutional: No distress.  Cardiovascular: Normal rate, regular rhythm and normal heart sounds.   Pulmonary/Chest: Effort normal and breath sounds normal.  Abdominal: Soft. Bowel sounds are normal. She exhibits no distension. There is no rebound and no guarding.  Slight soreness through midsection of stomach on left  Musculoskeletal: She exhibits no edema.  Neurological: She is alert. Gait normal.  Skin: Skin is warm and dry. She is not diaphoretic.     Assessment/Plan: Please see individual problem list.  Constipation Patient with constipation. This is improved with starting amitiza.  She does have some residual soreness. No extensive tenderness. She'll monitor this and if she has worsening stomach discomfort she'll be reevaluated. She'll continue the amitiza through her pain management team.  Overweight (BMI 25.0-29.9) Patient has done quite well on Saxenda. She is down 7 pounds. We will continue this. I rediscussed the risk of thyroid cancer with her. Advised to monitor the area of her thyroid and she has any changes being evaluated. Nicole Walter was refilled. Follow-up in one month.   Orders Placed This Encounter  Procedures  . Lipid Profile  . HgB A1c  . Comp Met (CMET)    Meds ordered this encounter  Medications  . SAXENDA 18 MG/3ML SOPN    Sig: Inject 3 mg into the skin daily.    Dispense:  5 pen    Refill:  [place], MD [place]

## 2017-07-19 NOTE — Patient Instructions (Signed)
Nice to see you. Congratulations on your weight loss. Please continue to work on diet and exercise and take the Saxenda. Please continue to monitor your constipation and stomach discomfort. If you continue to have issues with constipation please let us know. If your stomach discomfort returns please let us know.

## 2017-07-19 NOTE — Assessment & Plan Note (Signed)
Patient has done quite well on Saxenda. She is down 7 pounds. We will continue this. I rediscussed the risk of thyroid cancer with her. Advised to monitor the area of her thyroid and she has any changes being evaluated. Kirke Shaggy was refilled. Follow-up in one month.

## 2017-07-19 NOTE — Assessment & Plan Note (Signed)
Patient with constipation. This is improved with starting amitiza. She does have some residual soreness. No extensive tenderness. She'll monitor this and if she has worsening stomach discomfort she'll be reevaluated. She'll continue the amitiza through her pain management team.

## 2017-07-30 ENCOUNTER — Ambulatory Visit: Payer: BLUE CROSS/BLUE SHIELD | Admitting: Podiatry

## 2017-07-30 ENCOUNTER — Ambulatory Visit (INDEPENDENT_AMBULATORY_CARE_PROVIDER_SITE_OTHER): Payer: Self-pay | Admitting: Podiatry

## 2017-07-30 DIAGNOSIS — Z09 Encounter for follow-up examination after completed treatment for conditions other than malignant neoplasm: Secondary | ICD-10-CM

## 2017-07-30 MED ORDER — CEPHALEXIN 500 MG PO CAPS: 500.0000 mg | ORAL_CAPSULE | Freq: Two times a day (BID) | ORAL | 0 refills | Status: DC

## 2017-07-30 NOTE — Progress Notes (Addendum)
This patient returns to the office following nail surgery two weeks ago.    The patient says toe has been soaked and bandaged as directed.  There has been improvement of the toe since the surgery has been performed. The patient says there is still drainage on her toes right foot.  GENERAL APPEARANCE: Alert, conversant. Appropriately groomed. No acute distress.  VASCULAR: Pedal pulses palpable at  [facility] and PT bilateral.  Capillary refill time is immediate to all digits,  Normal temperature gradient.    NEUROLOGIC: sensation is normal to 5.07 monofilament at 5/5 sites bilateral.  Light touch is intact bilateral, Muscle strength normal.  MUSCULOSKELETAL: acceptable muscle strength, tone and stability bilateral.  Intrinsic muscluature intact bilateral.  Rectus appearance of foot and digits noted bilateral.   DERMATOLOGIC: skin color, texture, and turgor are within normal limits.  No preulcerative lesions or ulcers  are seen, no interdigital maceration noted.   NAILS  Minimal drainage 2,4 right in the absence of infection.  DX  S/p nail surgery  ROV  Home instructions were discussed.  Patient to call the office if there are any questions or concerns.  Prescribe cephalexin 500 mg.  # 15.  RTC prn   Gardiner Barefoot DPM

## 2017-08-06 ENCOUNTER — Ambulatory Visit: Payer: BLUE CROSS/BLUE SHIELD | Admitting: Pain Medicine

## 2017-08-13 ENCOUNTER — Ambulatory Visit: Payer: BLUE CROSS/BLUE SHIELD | Attending: Pain Medicine | Admitting: Pain Medicine

## 2017-08-13 ENCOUNTER — Encounter: Payer: Self-pay | Admitting: Pain Medicine

## 2017-08-13 ENCOUNTER — Ambulatory Visit: Payer: BLUE CROSS/BLUE SHIELD | Admitting: Podiatry

## 2017-08-13 VITALS — BP 114/66 | HR 81 | Temp 98.4°F | Resp 18 | Ht 64.0 in | Wt 155.0 lb

## 2017-08-13 DIAGNOSIS — M5441 Lumbago with sciatica, right side: Secondary | ICD-10-CM | POA: Diagnosis not present

## 2017-08-13 DIAGNOSIS — M542 Cervicalgia: Secondary | ICD-10-CM | POA: Diagnosis not present

## 2017-08-13 DIAGNOSIS — R0602 Shortness of breath: Secondary | ICD-10-CM | POA: Insufficient documentation

## 2017-08-13 DIAGNOSIS — M25512 Pain in left shoulder: Secondary | ICD-10-CM

## 2017-08-13 DIAGNOSIS — M47812 Spondylosis without myelopathy or radiculopathy, cervical region: Secondary | ICD-10-CM | POA: Diagnosis not present

## 2017-08-13 DIAGNOSIS — M25511 Pain in right shoulder: Secondary | ICD-10-CM

## 2017-08-13 DIAGNOSIS — F329 Major depressive disorder, single episode, unspecified: Secondary | ICD-10-CM | POA: Diagnosis not present

## 2017-08-13 DIAGNOSIS — K5900 Constipation, unspecified: Secondary | ICD-10-CM | POA: Diagnosis not present

## 2017-08-13 DIAGNOSIS — Z9889 Other specified postprocedural states: Secondary | ICD-10-CM | POA: Insufficient documentation

## 2017-08-13 DIAGNOSIS — E7801 Familial hypercholesterolemia: Secondary | ICD-10-CM | POA: Diagnosis not present

## 2017-08-13 DIAGNOSIS — M8580 Other specified disorders of bone density and structure, unspecified site: Secondary | ICD-10-CM | POA: Diagnosis not present

## 2017-08-13 DIAGNOSIS — M549 Dorsalgia, unspecified: Secondary | ICD-10-CM | POA: Diagnosis not present

## 2017-08-13 DIAGNOSIS — R7982 Elevated C-reactive protein (CRP): Secondary | ICD-10-CM | POA: Diagnosis not present

## 2017-08-13 DIAGNOSIS — Z87891 Personal history of nicotine dependence: Secondary | ICD-10-CM | POA: Diagnosis not present

## 2017-08-13 DIAGNOSIS — M5442 Lumbago with sciatica, left side: Secondary | ICD-10-CM

## 2017-08-13 DIAGNOSIS — M25561 Pain in right knee: Secondary | ICD-10-CM | POA: Diagnosis not present

## 2017-08-13 DIAGNOSIS — G894 Chronic pain syndrome: Secondary | ICD-10-CM | POA: Diagnosis not present

## 2017-08-13 DIAGNOSIS — M79605 Pain in left leg: Secondary | ICD-10-CM | POA: Insufficient documentation

## 2017-08-13 DIAGNOSIS — M1610 Unilateral primary osteoarthritis, unspecified hip: Secondary | ICD-10-CM | POA: Insufficient documentation

## 2017-08-13 DIAGNOSIS — M797 Fibromyalgia: Secondary | ICD-10-CM | POA: Diagnosis not present

## 2017-08-13 DIAGNOSIS — M4722 Other spondylosis with radiculopathy, cervical region: Secondary | ICD-10-CM | POA: Diagnosis not present

## 2017-08-13 DIAGNOSIS — G8929 Other chronic pain: Secondary | ICD-10-CM | POA: Diagnosis not present

## 2017-08-13 DIAGNOSIS — M9981 Other biomechanical lesions of cervical region: Secondary | ICD-10-CM | POA: Diagnosis not present

## 2017-08-13 DIAGNOSIS — Z833 Family history of diabetes mellitus: Secondary | ICD-10-CM | POA: Insufficient documentation

## 2017-08-13 DIAGNOSIS — Z9049 Acquired absence of other specified parts of digestive tract: Secondary | ICD-10-CM | POA: Diagnosis not present

## 2017-08-13 DIAGNOSIS — Z79891 Long term (current) use of opiate analgesic: Secondary | ICD-10-CM | POA: Diagnosis not present

## 2017-08-13 DIAGNOSIS — Z823 Family history of stroke: Secondary | ICD-10-CM | POA: Diagnosis not present

## 2017-08-13 DIAGNOSIS — R109 Unspecified abdominal pain: Secondary | ICD-10-CM | POA: Insufficient documentation

## 2017-08-13 DIAGNOSIS — M4802 Spinal stenosis, cervical region: Secondary | ICD-10-CM | POA: Diagnosis not present

## 2017-08-13 DIAGNOSIS — M5412 Radiculopathy, cervical region: Secondary | ICD-10-CM | POA: Insufficient documentation

## 2017-08-13 DIAGNOSIS — Z79899 Other long term (current) drug therapy: Secondary | ICD-10-CM | POA: Diagnosis not present

## 2017-08-13 DIAGNOSIS — F419 Anxiety disorder, unspecified: Secondary | ICD-10-CM | POA: Insufficient documentation

## 2017-08-13 DIAGNOSIS — M545 Low back pain, unspecified: Secondary | ICD-10-CM | POA: Insufficient documentation

## 2017-08-13 NOTE — Progress Notes (Signed)
Patient's Name: Nicole Walter  MRN: [id number]  Referring Provider: Leone Haven, MD  DOB: [birth year]  PCP: Leone Haven, MD  DOS: 08/13/2017  Note by: Gaspar Cola, MD  Service setting: Ambulatory outpatient  Specialty: Interventional Pain Management  Location: [facility] (AMB) Pain Management Facility    Patient type: Established   Primary Reason(s) for Visit: Encounter for post-procedure evaluation of chronic illness with mild to moderate exacerbation CC: No chief complaint on file.  HPI  Nicole Walter is a 56 y.o. year old, female patient, who comes today for a post-procedure evaluation. She has Anxiety and depression; Chronic pain syndrome; Lumbar spondylosis; Combined fat and carbohydrate induced hyperlipemia; Thoracic back pain; Chronic neck pain (Primary Source of Pain) (Bilateral) (L>R); Concentration deficit; Hyperlipidemia; Cervical spondylosis (Bilateral); Fibromyalgia; Osteopenia; Skin lesions; Chronic hip pain (Bilateral) (L>R); Bursitis of right shoulder; Chronic Greater trochanteric bursitis (Bilateral) (L>R); Long term current use of opiate analgesic; Long term prescription opiate use; Opiate use; Chronic upper back pain (Secondary source of pain) (Bilateral) (L>R); Chronic shoulder pain [facility] source of pain) (Bilateral) (L>R); Chronic knee pain (Right); Chronic upper extremity pain (Left); Chronic lower extremity pain (Bilateral) (L>R); Chronic abdominal pain; Elevated sedimentation rate; Elevated C-reactive protein (CRP); Overweight (BMI 25.0-29.9); Greater trochanteric bursitis (Right); Subacromial bursitis of shoulder joint (Right); Osteoarthritis of hip (Bilateral) (L>R); Thoracic radiculitis (R>L); Thoracic spondylosis; Shortness of breath; Osteoarthritis of shoulder (Right); Osteoarthritis of knee (Right); Chronic knee arthropathy (Right); Chronic right hip pain; Muscle spasm; Myofascial pain syndrome, cervical (trapezius) (Left); Cervical facet syndrome (Bilateral)  (L>R); Constipation; Chronic low back pain (Bilateral) (L>R); Cervical Foraminal Stenosis (Severe) (Bilateral: C-5-6); Cervical spondylosis with radiculopathy (Bilateral) (L>R); Cervical sensory radiculopathy at C5 (Bilateral); and Radicular pain of shoulder (Bilateral) (C5) on her problem list. Her primarily concern today is the No chief complaint on file.  Pain Assessment: Location: Upper Neck Radiating: both shoulders Onset: More than a month ago Duration: Chronic pain Quality: Aching, Burning, Constant, Radiating, Cramping, Spasm Severity: 2 /10 (self-reported pain score)  Note: Reported level is compatible with observation.                         Timing: Constant Modifying factors: medications, ice  Nicole Walter comes in today for post-procedure evaluation after the treatment done on 07/17/2017.  Further details on both, my assessment(s), as well as the proposed treatment plan, please see below.  Post-Procedure Assessment  07/17/2017 Procedure: Diagnostic bilateral cervical facet block under fluoroscopic guidance and IV sedation Pre-procedure pain score:  2/10 Post-procedure pain score: 0/10 (100% relief) Influential Factors: BMI: 26.61 kg/m Intra-procedural challenges: None observed.         Assessment challenges: None detected.              Reported side-effects: None.        Post-procedural adverse reactions or complications: None reported         Sedation: Sedation provided. When no sedatives are used, the analgesic levels obtained are directly associated to the effectiveness of the local anesthetics. However, when sedation is provided, the level of analgesia obtained during the initial 1 hour following the intervention, is believed to be the result of a combination of factors. These factors may include, but are not limited to: 1. The effectiveness of the local anesthetics used. 2. The effects of the analgesic(s) and/or anxiolytic(s) used. 3. The degree of discomfort experienced  by the patient at the time of the procedure. 4. The patients ability and  reliability in recalling and recording the events. 5. The presence and influence of possible secondary gains and/or psychosocial factors. Reported result: Relief experienced during the 1st hour after the procedure: 100 % (Ultra-Short Term Relief) Nicole Walter has indicated area to have been numb during this time. Interpretative annotation: Clinically appropriate result. Analgesia during this period is likely to be Local Anesthetic and/or IV Sedative (Analgesic/Anxiolytic) related.          Effects of local anesthetic: The analgesic effects attained during this period are directly associated to the localized infiltration of local anesthetics and therefore cary significant diagnostic value as to the etiological location, or anatomical origin, of the pain. Expected duration of relief is directly dependent on the pharmacodynamics of the local anesthetic used. Long-acting (4-6 hours) anesthetics used.  Reported result: Relief during the next 4 to 6 hour after the procedure: 100 % (Short-Term Relief) Nicole Walter has indicated area to have been numb during this time. Interpretative annotation: Clinically appropriate result. Analgesia during this period is likely to be Local Anesthetic-related.          Long-term benefit: Defined as the period of time past the expected duration of local anesthetics (1 hour for short-acting and 4-6 hours for long-acting). With the possible exception of prolonged sympathetic blockade from the local anesthetics, benefits during this period are typically attributed to, or associated with, other factors such as analgesic sensory neuropraxia, antiinflammatory effects, or beneficial biochemical changes provided by agents other than the local anesthetics.  Reported result: Extended relief following procedure: 50 % (for 10 days) (Long-Term Relief) Nicole Walter reports the axial pain improved more than the extremity  pain. Interpretative annotation: Clinically appropriate result. Good relief. No permanent benefit expected. Inflammation plays a part in the etiology to the pain.          Current benefits: Defined as reported results that persistent at this point in time.   Analgesia: 0 % She indicates that it has been getting worse. Function: Somewhat improved ROM: Somewhat improved Interpretative annotation: Recurrence of symptoms. Incomplete therapeutic success. Effective diagnostic intervention.          Interpretation: Results would suggest a successful diagnostic intervention.                  Plan:  Consider diagnostic procedure # 2.       Laboratory Chemistry  Inflammation Markers (CRP: Acute Phase) (ESR: Chronic Phase) Lab Results  Component Value Date   CRP 2.1 (H) 07/20/2016   ESRSEDRATE 2 05/15/2017                 Renal Function Markers Lab Results  Component Value Date   BUN 11 07/19/2017   CREATININE 0.96 07/19/2017   GFRAA >60 07/20/2016   GFRNONAA >60 07/20/2016                 Hepatic Function Markers Lab Results  Component Value Date   AST 21 07/19/2017   ALT 20 07/19/2017   ALBUMIN 4.3 07/19/2017   ALKPHOS 58 07/19/2017   HCVAB NEGATIVE 02/14/2017                 Electrolytes Lab Results  Component Value Date   NA 141 07/19/2017   K 3.6 07/19/2017   CL 105 07/19/2017   CALCIUM 9.5 07/19/2017   MG 2.2 05/15/2017                 Neuropathy Markers Lab Results  Component Value Date  VITAMINB12 987 (H) 07/20/2016                 Bone Pathology Markers Lab Results  Component Value Date   ALKPHOS 58 07/19/2017   25OHVITD1 58 07/20/2016   25OHVITD2 <1.0 07/20/2016   25OHVITD3 58 07/20/2016   CALCIUM 9.5 07/19/2017                 Coagulation Parameters Lab Results  Component Value Date   PLT 300 01/15/2017                 Cardiovascular Markers Lab Results  Component Value Date   HGB 12.4 01/15/2017   HCT 37.8 01/15/2017                  Note: Lab results reviewed.  Recent Diagnostic Imaging Results  DG C-Arm 1-60 Min-No Report Fluoroscopy was utilized by the requesting physician.  No radiographic  interpretation.   Complexity Note: Imaging results reviewed. Results shared with Nicole Walter, using Layman's terms.                         Meds   Current Outpatient Prescriptions:  .  Calcium Carbonate-Vit D-Min (CALCIUM 1200 PO), Take by mouth., Disp: , Rfl:  .  cephALEXin (KEFLEX) 500 MG capsule, Take 1 capsule (500 mg total) by mouth 2 (two) times daily., Disp: 15 capsule, Rfl: 0 .  Cholecalciferol (VITAMIN D) 2000 units tablet, Take 4,000 Units by mouth daily., Disp: , Rfl:  .  fluocinonide ointment (LIDEX) 0.05 %, APPLY TOPICALLY TWICE A DAY TO ITCHY AREAS ONLY. as needed, Disp: , Rfl: 0 .  fluticasone (FLONASE) 50 MCG/ACT nasal spray, instill 2 sprays into each nostril once daily, Disp: 16 g, Rfl: 11 .  lamoTRIgine (LAMICTAL) 150 MG tablet, Take 1 Tablet once a day, Disp: , Rfl: 0 .  lubiprostone (AMITIZA) 24 MCG capsule, Take 1 capsule (24 mcg total) by mouth 2 (two) times daily with a meal. Swallow the medication whole. Do not break or chew the medication., Disp: 60 capsule, Rfl: 0 .  Multiple Vitamin (MULTI-VITAMINS) TABS, Take 1 tablet by mouth daily. , Disp: , Rfl:  .  oxyCODONE (OXY IR/ROXICODONE) 5 MG immediate release tablet, Take 1 tablet (5 mg total) by mouth every 6 (six) hours as needed for severe pain., Disp: 120 tablet, Rfl: 0 .  [START ON 09/10/2017] oxyCODONE (OXY IR/ROXICODONE) 5 MG immediate release tablet, Take 1 tablet (5 mg total) by mouth every 6 (six) hours as needed for severe pain., Disp: 120 tablet, Rfl: 0 .  RABEprazole (ACIPHEX) 20 MG tablet, Take 20 mg by mouth as needed. , Disp: , Rfl:  .  simvastatin (ZOCOR) 20 MG tablet, take 1 tablet by mouth at bedtime, Disp: 90 tablet, Rfl: 1 .  sucralfate (CARAFATE) 1 g tablet, take 1 tablet by mouth three times a day if needed for abdominal pain, Disp:  , Rfl: 0 .  SAXENDA 18 MG/3ML SOPN, Inject 3 mg into the skin daily., Disp: 5 pen, Rfl: 1  ROS  Constitutional: Denies any fever or chills Gastrointestinal: No reported hemesis, hematochezia, vomiting, or acute GI distress Musculoskeletal: Denies any acute onset joint swelling, redness, loss of ROM, or weakness Neurological: No reported episodes of acute onset apraxia, aphasia, dysarthria, agnosia, amnesia, paralysis, loss of coordination, or loss of consciousness  Allergies  Nicole Walter is allergic to other and food.  PFSH  Drug: Nicole Walter  reports that  she does not use drugs. Alcohol:  reports that she does not drink alcohol. Tobacco:  reports that she quit smoking about 2 years ago. She started smoking about 43 years ago. She has a 35.00 pack-year smoking history. She has never used smokeless tobacco. Medical:  has a past medical history of Abnormal liver enzymes (02/08/2016); Allergy; Anxiety; Chest pain (07/05/2016); Chronic pain; Depression; DJD (degenerative joint disease); Fibromyalgia; High cholesterol; Rash of back (01/28/2016); and Weight loss due to medication. Surgical: Nicole Walter  has a past surgical history that includes Cholecystectomy; Tubal ligation; Ankle surgery; Nasal sinus surgery; Tonsillectomy; Colonoscopy; Esophagogastroduodenoscopy (egd) with propofol (N/A, 02/22/2016); Colonoscopy with propofol (N/A, 02/22/2016); and Breast biopsy (Left, 1997). Family: family history includes Arthritis in her father, paternal grandfather, and paternal grandmother; Cancer in her brother and father; Dementia in her sister; Diabetes in her sister; Drug abuse in her brother, brother, and sister; Heart disease in her mother; Post-traumatic stress disorder in her brother; Stroke in her mother.  Constitutional Exam  General appearance: Well nourished, well developed, and well hydrated. In no apparent acute distress Vitals:   08/13/17 0953  BP: 114/66  Pulse: 81  Resp: 18  Temp: 98.4 F (36.9 C)   TempSrc: Oral  SpO2: 100%  Weight: 155 lb (70.3 kg)  Height: '5\' 4"'  (1.626 m)   BMI Assessment: Estimated body mass index is 26.61 kg/m as calculated from the following:   Height as of this encounter: '5\' 4"'  (1.626 m).   Weight as of this encounter: 155 lb (70.3 kg).  BMI interpretation table: BMI level Category Range association with higher incidence of chronic pain  <18 kg/m2 Underweight   18.5-24.9 kg/m2 Ideal body weight   25-29.9 kg/m2 Overweight Increased incidence by 20%  30-34.9 kg/m2 Obese (Class I) Increased incidence by 68%  35-39.9 kg/m2 Severe obesity (Class II) Increased incidence by 136%  >40 kg/m2 Extreme obesity (Class III) Increased incidence by 254%   BMI Readings from Last 4 Encounters:  08/13/17 26.61 kg/m  07/19/17 26.98 kg/m  07/17/17 27.12 kg/m  07/16/17 27.12 kg/m   Wt Readings from Last 4 Encounters:  08/13/17 155 lb (70.3 kg)  07/19/17 157 lb 3.2 oz (71.3 kg)  07/17/17 158 lb (71.7 kg)  07/16/17 158 lb (71.7 kg)  Psych/Mental status: Alert, oriented x 3 (person, place, & time)       Eyes: PERLA Respiratory: No evidence of acute respiratory distress  Cervical Spine Area Exam  Skin & Axial Inspection: No masses, redness, edema, swelling, or associated skin lesions Alignment: Symmetrical Functional ROM: Decreased ROM, bilaterally (L>R) Stability: No instability detected Muscle Tone/Strength: Functionally intact. No obvious neuro-muscular anomalies detected. Sensory (Neurological): Movement-associated pain Palpation: No palpable anomalies              Upper Extremity (UE) Exam    Side: Right upper extremity  Side: Left upper extremity  Skin & Extremity Inspection: Skin color, temperature, and hair growth are WNL. No peripheral edema or cyanosis. No masses, redness, swelling, asymmetry, or associated skin lesions. No contractures.  Skin & Extremity Inspection: Skin color, temperature, and hair growth are WNL. No peripheral edema or cyanosis. No  masses, redness, swelling, asymmetry, or associated skin lesions. No contractures.  Functional ROM: Decreased ROM for shoulder  Functional ROM: Decreased ROM for shoulder  Muscle Tone/Strength: Functionally intact. No obvious neuro-muscular anomalies detected.  Muscle Tone/Strength: Functionally intact. No obvious neuro-muscular anomalies detected.  Sensory (Neurological): Movement-associated discomfort affecting the shoulder. C5 shoulder radiculitis.  Sensory (Neurological): Movement-associated  discomfort affecting the shoulder. C5 shoulder radiculitis.  Palpation: No palpable anomalies              Palpation: No palpable anomalies              Specialized Test(s): Deferred         Specialized Test(s): Deferred          Thoracic Spine Area Exam  Skin & Axial Inspection: No masses, redness, or swelling Alignment: Symmetrical Functional ROM: Unrestricted ROM Stability: No instability detected Muscle Tone/Strength: Functionally intact. No obvious neuro-muscular anomalies detected. Sensory (Neurological): Unimpaired Muscle strength & Tone: No palpable anomalies  Lumbar Spine Area Exam  Skin & Axial Inspection: No masses, redness, or swelling Alignment: Symmetrical Functional ROM: Decreased ROM      Stability: No instability detected Muscle Tone/Strength: Functionally intact. No obvious neuro-muscular anomalies detected. Sensory (Neurological): Unimpaired Palpation: No palpable anomalies       Provocative Tests: Lumbar Hyperextension and rotation test: Positive bilaterally for facet joint pain. Lumbar Lateral bending test: evaluation deferred today       Patrick's Maneuver: evaluation deferred today                    Gait & Posture Assessment  Ambulation: Unassisted Gait: Relatively normal for age and body habitus Posture: WNL   Lower Extremity Exam    Side: Right lower extremity  Side: Left lower extremity  Skin & Extremity Inspection: Skin color, temperature, and hair growth are  WNL. No peripheral edema or cyanosis. No masses, redness, swelling, asymmetry, or associated skin lesions. No contractures.  Skin & Extremity Inspection: Skin color, temperature, and hair growth are WNL. No peripheral edema or cyanosis. No masses, redness, swelling, asymmetry, or associated skin lesions. No contractures.  Functional ROM: Unrestricted ROM          Functional ROM: Unrestricted ROM          Muscle Tone/Strength: Functionally intact. No obvious neuro-muscular anomalies detected.  Muscle Tone/Strength: Functionally intact. No obvious neuro-muscular anomalies detected.  Sensory (Neurological): Unimpaired  Sensory (Neurological): Unimpaired  Palpation: No palpable anomalies  Palpation: No palpable anomalies   Assessment  Primary Diagnosis & Pertinent Problem List: The primary encounter diagnosis was Chronic neck pain (Primary Source of Pain) (Bilateral) (L>R). Diagnoses of Cervical facet syndrome (Bilateral) (L>R), Chronic upper back pain (Secondary source of pain) (Bilateral) (L>R), Chronic shoulder pain (Tertiary source of pain) (Bilateral) (L>R), Cervical spondylosis (Bilateral), Chronic low back pain (Bilateral) (L>R), Cervical Foraminal Stenosis (Severe) (Bilateral: C-5-6), Cervical spondylosis with radiculopathy (Bilateral) (L>R), Cervical sensory radiculopathy at C5 (Bilateral), and Radicular pain of shoulder (Bilateral) (C5) were also pertinent to this visit.  Status Diagnosis  Worsening Worsening Worsening 1. Chronic neck pain (Primary Source of Pain) (Bilateral) (L>R)   2. Cervical facet syndrome (Bilateral) (L>R)   3. Chronic upper back pain (Secondary source of pain) (Bilateral) (L>R)   4. Chronic shoulder pain [facility] source of pain) (Bilateral) (L>R)   5. Cervical spondylosis (Bilateral)   6. Chronic low back pain (Bilateral) (L>R)   7. Cervical Foraminal Stenosis (Severe) (Bilateral: C-5-6)   8. Cervical spondylosis with radiculopathy (Bilateral) (L>R)   9. Cervical  sensory radiculopathy at C5 (Bilateral)   10. Radicular pain of shoulder (Bilateral) (C5)     Problems updated and reviewed during this visit: Problem  Chronic low back pain (Bilateral) (L>R)  Cervical Foraminal Stenosis (Severe) (Bilateral: C-5-6)   Severe cervical foraminal narrowing, worse on the left at C5-6 with bilateral C5  recurrent radiculitis/radiculopathy.   Cervical spondylosis with radiculopathy (Bilateral) (L>R)  Cervical sensory radiculopathy at C5 (Bilateral)  Radicular pain of shoulder (Bilateral) (C5)  Chronic upper back pain (Secondary source of pain) (Bilateral) (L>R)  Chronic shoulder pain (Tertiary source of pain) (Bilateral) (L>R)  Chronic neck pain (Primary Source of Pain) (Bilateral) (L>R)   Plan of Care  Pharmacotherapy (Medications Ordered): No orders of the defined types were placed in this encounter.  New Prescriptions   No medications on file   Medications administered today: Nicole Walter had no medications administered during this visit.   Procedure Orders     CERVICAL FACET (MEDIAL BRANCH NERVE BLOCK)  Lab Orders  No laboratory test(s) ordered today   Imaging Orders  No imaging studies ordered today    Referral Orders     Ambulatory referral to Physical Therapy     Ambulatory referral to Neurosurgery  Interventional management options: Planned, scheduled, and/or pending:   Diagnostic bilateral cervical facet block #2, under fluoro and IV sedation. Physical therapy trial.   Considering:   Diagnostic bilateral cervical facet block Possible bilateral cervical facet radiofrequencyablation Diagnostic left-sided cervical epidural steroid injection Diagnostic bilateral intra-articular shoulder joint injection Diagnostic bilateral suprascapular nerve block Possible bilateral suprascapular nerve radiofrequencyablation Diagnostic bilateral lumbar facet block Possible bilateral lumbar facet radiofrequencyablation  Diagnostic bilateral  intra-articular hip joint injection Diagnostic bilateral femoral and obturator tickler branch blocks Possible bilateral hip joint radiofrequencyablation  Diagnosticright intra-articular knee joint injection Possible series of 5, right-sided, intra-articular knee joint injections with Hyalgan.  Possible right sided genicular nerve block Possible right sided genicular nerve radiofrequencyablation Possible left-sided lumbar epidural steroid injection   Palliative PRN treatment(s):   Palliativebilateral suprascapular nerve block Palliativeright intra-articular knee joint injection   Provider-requested follow-up: Return for Procedure (w/ sedation): (B) C-FCT Blk #2.  Future Appointments Date Time Provider [place]  08/24/2017 11:30 AM Leone Haven, MD [id number] None  09/10/2017 9:00 AM Vevelyn Francois, NP [facility] None   Primary Care Physician: Leone Haven, MD Location: [facility] Outpatient Pain Management Facility Note by: Gaspar Cola, MD Date: 08/13/2017; Time: 11:18 AM

## 2017-08-13 NOTE — Progress Notes (Signed)
Safety precautions to be maintained throughout the outpatient stay will include: orient to surroundings, keep bed in low position, maintain call bell within reach at all times, provide assistance with transfer out of bed and ambulation.  

## 2017-08-13 NOTE — Patient Instructions (Addendum)
____________________________________________________________________________________________  Preparing for Procedure with Sedation Instructions: . Oral Intake: Do not eat or drink anything for at least 8 hours prior to your procedure. . Transportation: Public transportation is not allowed. Bring an adult driver. The driver must be physically present in our waiting room before any procedure can be started. Marland Kitchen Physical Assistance: Bring an adult physically capable of assisting you, in the event you need help. This adult should keep you company at home for at least 6 hours after the procedure. . Blood Pressure Medicine: Take your blood pressure medicine with a sip of water the morning of the procedure. . Blood thinners:  . Diabetics on insulin: Notify the staff so that you can be scheduled 1st case in the morning. If your diabetes requires high dose insulin, take only  of your normal insulin dose the morning of the procedure and notify the staff that you have done so. . Preventing infections: Shower with an antibacterial soap the morning of your procedure. . Build-up your immune system: Take 1000 mg of Vitamin C with every meal (3 times a day) the day prior to your procedure. Marland Kitchen Antibiotics: Inform the staff if you have a condition or reason that requires you to take antibiotics before dental procedures. . Pregnancy: If you are pregnant, call and cancel the procedure. . Sickness: If you have a cold, fever, or any active infections, call and cancel the procedure. . Arrival: You must be in the facility at least 30 minutes prior to your scheduled procedure. . Children: Do not bring children with you. . Dress appropriately: Bring dark clothing that you would not mind if they get stained. . Valuables: Do not bring any jewelry or valuables. Procedure appointments are reserved for interventional treatments only. Marland Kitchen No Prescription Refills. . No medication changes will be discussed during procedure  appointments. . No disability issues will be discussed. ____________________________________________________________________________________________  Pain Management Discharge Instructions  General Discharge Instructions :  If you need to reach your doctor call: Monday-Friday 8:00 am - 4:00 pm at [phone] or toll free [phone].  After clinic hours [phone] to have operator reach doctor.  Bring all of your medication bottles to all your appointments in the pain clinic.  To cancel or reschedule your appointment with Pain Management please remember to call 24 hours in advance to avoid a fee.  Refer to the educational materials which you have been given on: General Risks, I had my Procedure. Discharge Instructions, Post Sedation.  Post Procedure Instructions:  The drugs you were given will stay in your system until tomorrow, so for the next 24 hours you should not drive, make any legal decisions or drink any alcoholic beverages.  You may eat anything you prefer, but it is better to start with liquids then soups and crackers, and gradually work up to solid foods.  Please notify your doctor immediately if you have any unusual bleeding, trouble breathing or pain that is not related to your normal pain.  Depending on the type of procedure that was done, some parts of your body may feel week and/or numb.  This usually clears up by tonight or the next day.  Walk with the use of an assistive device or accompanied by an adult for the 24 hours.  You may use ice on the affected area for the first 24 hours.  Put ice in a Ziploc bag and cover with a towel and place against area 15 minutes on 15 minutes off.  You may switch to heat after  24 hours.GENERAL RISKS AND COMPLICATIONS  What are the risk, side effects and possible complications? Generally speaking, most procedures are safe.  However, with any procedure there are risks, side effects, and the possibility of complications.  The  risks and complications are dependent upon the sites that are lesioned, or the type of nerve block to be performed.  The closer the procedure is to the spine, the more serious the risks are.  Great care is taken when placing the radio frequency needles, block needles or lesioning probes, but sometimes complications can occur. 1. Infection: Any time there is an injection through the skin, there is a risk of infection.  This is why sterile conditions are used for these blocks.  There are four possible types of infection. 1. Localized skin infection. 2. Central Nervous System Infection-This can be in the form of Meningitis, which can be deadly. 3. Epidural Infections-This can be in the form of an epidural abscess, which can cause pressure inside of the spine, causing compression of the spinal cord with subsequent paralysis. This would require an emergency surgery to decompress, and there are no guarantees that the patient would recover from the paralysis. 4. Discitis-This is an infection of the intervertebral discs.  It occurs in about 1% of discography procedures.  It is difficult to treat and it may lead to surgery.        2. Pain: the needles have to go through skin and soft tissues, will cause soreness.       3. Damage to internal structures:  The nerves to be lesioned may be near blood vessels or    other nerves which can be potentially damaged.       4. Bleeding: Bleeding is more common if the patient is taking blood thinners such as  aspirin, Coumadin, Ticiid, Plavix, etc., or if he/she have some genetic predisposition  such as hemophilia. Bleeding into the spinal canal can cause compression of the spinal  cord with subsequent paralysis.  This would require an emergency surgery to  decompress and there are no guarantees that the patient would recover from the  paralysis.       5. Pneumothorax:  Puncturing of a lung is a possibility, every time a needle is introduced in  the area of the chest or upper  back.  Pneumothorax refers to free air around the  collapsed lung(s), inside of the thoracic cavity (chest cavity).  Another two possible  complications related to a similar event would include: Hemothorax and Chylothorax.   These are variations of the Pneumothorax, where instead of air around the collapsed  lung(s), you may have blood or chyle, respectively.       6. Spinal headaches: They may occur with any procedures in the area of the spine.       7. Persistent CSF (Cerebro-Spinal Fluid) leakage: This is a rare problem, but may occur  with prolonged intrathecal or epidural catheters either due to the formation of a fistulous  track or a dural tear.       8. Nerve damage: By working so close to the spinal cord, there is always a possibility of  nerve damage, which could be as serious as a permanent spinal cord injury with  paralysis.       9. Death:  Although rare, severe deadly allergic reactions known as "Anaphylactic  reaction" can occur to any of the medications used.      10. Worsening of the symptoms:  We can always make  thing worse.  What are the chances of something like this happening? Chances of any of this occuring are extremely low.  By statistics, you have more of a chance of getting killed in a motor vehicle accident: while driving to the hospital than any of the above occurring .  Nevertheless, you should be aware that they are possibilities.  In general, it is similar to taking a shower.  Everybody knows that you can slip, hit your head and get killed.  Does that mean that you should not shower again?  Nevertheless always keep in mind that statistics do not mean anything if you happen to be on the wrong side of them.  Even if a procedure has a 1 (one) in a 1,000,000 (million) chance of going wrong, it you happen to be that one..Also, keep in mind that by statistics, you have more of a chance of having something go wrong when taking medications.  Who should not have this procedure? If  you are on a blood thinning medication (e.g. Coumadin, Plavix, see list of "Blood Thinners"), or if you have an active infection going on, you should not have the procedure.  If you are taking any blood thinners, please inform your physician.  How should I prepare for this procedure?  Do not eat or drink anything at least six hours prior to the procedure.  Bring a driver with you .  It cannot be a taxi.  Come accompanied by an adult that can drive you back, and that is strong enough to help you if your legs get weak or numb from the local anesthetic.  Take all of your medicines the morning of the procedure with just enough water to swallow them.  If you have diabetes, make sure that you are scheduled to have your procedure done first thing in the morning, whenever possible.  If you have diabetes, take only half of your insulin dose and notify our nurse that you have done so as soon as you arrive at the clinic.  If you are diabetic, but only take blood sugar pills (oral hypoglycemic), then do not take them on the morning of your procedure.  You may take them after you have had the procedure.  Do not take aspirin or any aspirin-containing medications, at least eleven (11) days prior to the procedure.  They may prolong bleeding.  Wear loose fitting clothing that may be easy to take off and that you would not mind if it got stained with Betadine or blood.  Do not wear any jewelry or perfume  Remove any nail coloring.  It will interfere with some of our monitoring equipment.  NOTE: Remember that this is not meant to be interpreted as a complete list of all possible complications.  Unforeseen problems may occur.  BLOOD THINNERS The following drugs contain aspirin or other products, which can cause increased bleeding during surgery and should not be taken for 2 weeks prior to and 1 week after surgery.  If you should need take something for relief of minor pain, you may take acetaminophen which  is found in Tylenol,m Datril, Anacin-3 and Panadol. It is not blood thinner. The products listed below are.  Do not take any of the products listed below in addition to any listed on your instruction sheet.  A.P.C or A.P.C with Codeine Codeine Phosphate Capsules #3 Ibuprofen Ridaura  ABC compound Congesprin Imuran rimadil  Advil Cope Indocin Robaxisal  Alka-Seltzer Effervescent Pain Reliever and Antacid Coricidin or Coricidin-D  Indomethacin Rufen  Alka-Seltzer plus Cold Medicine Cosprin Ketoprofen S-A-C Tablets  Anacin Analgesic Tablets or Capsules Coumadin Korlgesic Salflex  Anacin Extra Strength Analgesic tablets or capsules CP-2 Tablets Lanoril Salicylate  Anaprox Cuprimine Capsules Levenox Salocol  Anexsia-D Dalteparin Magan Salsalate  Anodynos Darvon compound Magnesium Salicylate Sine-off  Ansaid Dasin Capsules Magsal Sodium Salicylate  Anturane Depen Capsules Marnal Soma  APF Arthritis pain formula Dewitt's Pills Measurin Stanback  Argesic Dia-Gesic Meclofenamic Sulfinpyrazone  Arthritis Bayer Timed Release Aspirin Diclofenac Meclomen Sulindac  Arthritis pain formula Anacin Dicumarol Medipren Supac  Analgesic (Safety coated) Arthralgen Diffunasal Mefanamic Suprofen  Arthritis Strength Bufferin Dihydrocodeine Mepro Compound Suprol  Arthropan liquid Dopirydamole Methcarbomol with Aspirin Synalgos  ASA tablets/Enseals Disalcid Micrainin Tagament  Ascriptin Doan's Midol Talwin  Ascriptin A/D Dolene Mobidin Tanderil  Ascriptin Extra Strength Dolobid Moblgesic Ticlid  Ascriptin with Codeine Doloprin or Doloprin with Codeine Momentum Tolectin  Asperbuf Duoprin Mono-gesic Trendar  Aspergum Duradyne Motrin or Motrin IB Triminicin  Aspirin plain, buffered or enteric coated Durasal Myochrisine Trigesic  Aspirin Suppositories Easprin Nalfon Trillsate  Aspirin with Codeine Ecotrin Regular or Extra Strength Naprosyn Uracel  Atromid-S Efficin Naproxen Ursinus  Auranofin Capsules Elmiron  Neocylate Vanquish  Axotal Emagrin Norgesic Verin  Azathioprine Empirin or Empirin with Codeine Normiflo Vitamin E  Azolid Emprazil Nuprin Voltaren  Bayer Aspirin plain, buffered or children's or timed BC Tablets or powders Encaprin Orgaran Warfarin Sodium  Buff-a-Comp Enoxaparin Orudis Zorpin  Buff-a-Comp with Codeine Equegesic Os-Cal-Gesic   Buffaprin Excedrin plain, buffered or Extra Strength Oxalid   Bufferin Arthritis Strength Feldene Oxphenbutazone   Bufferin plain or Extra Strength Feldene Capsules Oxycodone with Aspirin   Bufferin with Codeine Fenoprofen Fenoprofen Pabalate or Pabalate-SF   Buffets II Flogesic Panagesic   Buffinol plain or Extra Strength Florinal or Florinal with Codeine Panwarfarin   Buf-Tabs Flurbiprofen Penicillamine   Butalbital Compound Four-way cold tablets Penicillin   Butazolidin Fragmin Pepto-Bismol   Carbenicillin Geminisyn Percodan   Carna Arthritis Reliever Geopen Persantine   Carprofen Gold's salt Persistin   Chloramphenicol Goody's Phenylbutazone   Chloromycetin Haltrain Piroxlcam   Clmetidine heparin Plaquenil   Cllnoril Hyco-pap Ponstel   Clofibrate Hydroxy chloroquine Propoxyphen         Before stopping any of these medications, be sure to consult the physician who ordered them.  Some, such as Coumadin (Warfarin) are ordered to prevent or treat serious conditions such as "deep thrombosis", "pumonary embolisms", and other heart problems.  The amount of time that you may need off of the medication may also vary with the medication and the reason for which you were taking it.  If you are taking any of these medications, please make sure you notify your pain physician before you undergo any procedures.          Facet Joint Block The facet joints connect the bones of the spine (vertebrae). They make it possible for you to bend, twist, and make other movements with your spine. They also keep you from bending too far, twisting too far, and  making other excessive movements. A facet joint block is a procedure where a numbing medicine (anesthetic) is injected into a facet joint. Often, a type of anti-inflammatory medicine called a steroid is also injected. A facet joint block may be done to diagnose neck or back pain. If the pain gets better after a facet joint block, it means the pain is probably coming from the facet joint. If the pain does not get better, it means the pain  is probably not coming from the facet joint. A facet joint block may also be done to relieve neck or back pain caused by an inflamed facet joint. A facet joint block is only done to relieve pain if the pain does not improve with other methods, such as medicine, exercise programs, and physical therapy. Tell a health care provider about:  Any allergies you have.  All medicines you are taking, including vitamins, herbs, eye drops, creams, and over-the-counter medicines.  Any problems you or family members have had with anesthetic medicines.  Any blood disorders you have.  Any surgeries you have had.  Any medical conditions you have.  Whether you are pregnant or may be pregnant. What are the risks? Generally, this is a safe procedure. However, problems may occur, including:  Bleeding.  Injury to a nerve near the injection site.  Pain at the injection site.  Weakness or numbness in areas controlled by nerves near the injection site.  Infection.  Temporary fluid retention.  Allergic reactions to medicines or dyes.  Injury to other structures or organs near the injection site.  What happens before the procedure?  Follow instructions from your health care provider about eating or drinking restrictions.  Ask your health care provider about: ? Changing or stopping your regular medicines. This is especially important if you are taking diabetes medicines or blood thinners. ? Taking medicines such as aspirin and ibuprofen. These medicines can thin your  blood. Do not take these medicines before your procedure if your health care provider instructs you not to.  Do not take any new dietary supplements or medicines without asking your health care provider first.  Plan to have someone take you home after the procedure. What happens during the procedure?  You may need to remove your clothing and dress in an open-back gown.  The procedure will be done while you are lying on an X-ray table. You will most likely be asked to lie on your stomach, but you may be asked to lie in a different position if an injection will be made in your neck.  Machines will be used to monitor your oxygen levels, heart rate, and blood pressure.  If an injection will be made in your neck, an IV tube will be inserted into one of your veins. Fluids and medicine will flow directly into your body through the IV tube.  The area over the facet joint where the injection will be made will be cleaned with soap. The surrounding skin will be covered with clean drapes.  A numbing medicine (local anesthetic) will be applied to your skin. Your skin may sting or burn for a moment.  A video X-ray machine (fluoroscopy) will be used to locate the joint. In some cases, a CT scan may be used.  A contrast dye may be injected into the facet joint area to help locate the joint.  When the joint is located, an anesthetic will be injected into the joint through the needle.  Your health care provider will ask you whether you feel pain relief. If you do feel relief, a steroid may be injected to provide pain relief for a longer period of time. If you do not feel relief or feel only partial relief, additional injections of an anesthetic may be made in other facet joints.  The needle will be removed.  Your skin will be cleaned.  A bandage (dressing) will be applied over each injection site. The procedure may vary among health care providers and  hospitals. What happens after the  procedure?  You will be observed for 15-30 minutes before being allowed to go home. This information is not intended to replace advice given to you by your health care provider. Make sure you discuss any questions you have with your health care provider. Document Released: 03/14/2007 Document Revised: 11/24/2015 Document Reviewed: 07/19/2015 Elsevier Interactive Patient Education  [employer].

## 2017-08-16 ENCOUNTER — Encounter: Payer: Self-pay | Admitting: Pain Medicine

## 2017-08-16 ENCOUNTER — Ambulatory Visit
Admission: RE | Admit: 2017-08-16 | Discharge: 2017-08-16 | Disposition: A | Discharge status: Home / Self Care | Payer: BLUE CROSS/BLUE SHIELD | Source: Ambulatory Visit | Attending: Pain Medicine | Admitting: Pain Medicine

## 2017-08-16 ENCOUNTER — Ambulatory Visit (HOSPITAL_BASED_OUTPATIENT_CLINIC_OR_DEPARTMENT_OTHER): Payer: BLUE CROSS/BLUE SHIELD | Admitting: Pain Medicine

## 2017-08-16 ENCOUNTER — Telehealth: Payer: Self-pay | Admitting: Nurse Practitioner

## 2017-08-16 VITALS — BP 122/66 | HR 72 | Temp 97.5°F | Resp 16 | Ht 64.0 in | Wt 153.0 lb

## 2017-08-16 DIAGNOSIS — M542 Cervicalgia: Secondary | ICD-10-CM | POA: Diagnosis present

## 2017-08-16 DIAGNOSIS — G8929 Other chronic pain: Secondary | ICD-10-CM | POA: Insufficient documentation

## 2017-08-16 DIAGNOSIS — Z9049 Acquired absence of other specified parts of digestive tract: Secondary | ICD-10-CM | POA: Diagnosis not present

## 2017-08-16 DIAGNOSIS — M47812 Spondylosis without myelopathy or radiculopathy, cervical region: Secondary | ICD-10-CM | POA: Diagnosis not present

## 2017-08-16 MED ORDER — MIDAZOLAM HCL 5 MG/5ML IJ SOLN
1.0000 mg | INTRAMUSCULAR | Status: DC | PRN
  Administered 2017-08-16: 4 mg via INTRAVENOUS
  Filled 2017-08-16: qty 5

## 2017-08-16 MED ORDER — LIDOCAINE HCL 2 % IJ SOLN
10.0000 mL | Freq: Once | INTRAMUSCULAR | Status: AC
  Administered 2017-08-16: 200 mg
  Filled 2017-08-16: qty 20

## 2017-08-16 MED ORDER — LACTATED RINGERS IV SOLN
1000.0000 mL | Freq: Once | INTRAVENOUS | Status: AC
  Administered 2017-08-16: 1000 mL via INTRAVENOUS

## 2017-08-16 MED ORDER — DEXAMETHASONE SODIUM PHOSPHATE 10 MG/ML IJ SOLN
10.0000 mg | Freq: Once | INTRAMUSCULAR | Status: AC
  Administered 2017-08-16: 10 mg
  Filled 2017-08-16: qty 1

## 2017-08-16 MED ORDER — FENTANYL CITRATE (PF) 100 MCG/2ML IJ SOLN
25.0000 ug | INTRAMUSCULAR | Status: DC | PRN
  Administered 2017-08-16: 100 ug via INTRAVENOUS
  Filled 2017-08-16: qty 2

## 2017-08-16 MED ORDER — ROPIVACAINE HCL 2 MG/ML IJ SOLN
9.0000 mL | Freq: Once | INTRAMUSCULAR | Status: AC
  Administered 2017-08-16: 10 mL via PERINEURAL
  Filled 2017-08-16: qty 10

## 2017-08-16 MED ORDER — ROPIVACAINE HCL 2 MG/ML IJ SOLN
9.0000 mL | Freq: Once | INTRAMUSCULAR | Status: AC
  Administered 2017-08-16: 9 mL via PERINEURAL
  Filled 2017-08-16: qty 10

## 2017-08-16 NOTE — Progress Notes (Signed)
Safety precautions to be maintained throughout the outpatient stay will include: orient to surroundings, keep bed in low position, maintain call bell within reach at all times, provide assistance with transfer out of bed and ambulation.  

## 2017-08-16 NOTE — Patient Instructions (Signed)

## 2017-08-16 NOTE — Telephone Encounter (Signed)
Still having constipation problems, please call to discuss stronger med for this

## 2017-08-16 NOTE — Telephone Encounter (Signed)
Patient called and informed NO MED CHANGES over the phone. Needs to discuss at next visit.

## 2017-08-16 NOTE — Progress Notes (Signed)
Patient's Name: Nicole Walter  MRN: [id number]  Referring Provider: Milinda Pointer, MD  DOB: 09/28/1961  PCP: Leone Haven, MD  DOS: 08/16/2017  Note by: Gaspar Cola, MD  Service setting: Ambulatory outpatient  Specialty: Interventional Pain Management  Patient type: Established  Location: [facility] (AMB) Pain Management Facility  Visit type: Interventional Procedure   Primary Reason for Visit: Interventional Pain Management Treatment. CC: Neck Pain  Procedure:  Anesthesia, Analgesia, Anxiolysis:  Type: Diagnostic Cervical Facet Medial Branch Block(s) Region: Posterolateral cervical spine region Level: C3, C4, C5, C6, & C7 Medial Branch Level(s) Laterality: Bilateral Paraspinal  Type: Local Anesthesia with Moderate (Conscious) Sedation Local Anesthetic: Lidocaine 1% Route: Intravenous (IV) IV Access: Secured Sedation: Meaningful verbal contact was maintained at all times during the procedure  Indication(s): Analgesia and Anxiety   Indications: 1. Cervical facet syndrome (Bilateral) (L>R)   2. Cervical spondylosis (Bilateral)   3. Chronic neck pain (Primary Source of Pain) (Bilateral) (L>R)    Pain Score: Pre-procedure: 2 /10 Post-procedure: 0-No pain/10  Pre-op Assessment:  Nicole Walter is a 56 y.o. (year old), female patient, seen today for interventional treatment. She  has a past surgical history that includes Cholecystectomy; Tubal ligation; Ankle surgery; Nasal sinus surgery; Tonsillectomy; Colonoscopy; Esophagogastroduodenoscopy (egd) with propofol (N/A, 02/22/2016); Colonoscopy with propofol (N/A, 02/22/2016); and Breast biopsy (Left, 1997). Nicole Walter has a current medication list which includes the following prescription(s): calcium carbonate-vit d-min, vitamin d, fluocinonide ointment, fluticasone, lamotrigine, multi-vitamins, oxycodone, oxycodone, rabeprazole, saxenda, simvastatin, and sucralfate, and the following Facility-Administered Medications: fentanyl and  midazolam. Her primarily concern today is the Neck Pain  Initial Vital Signs: There were no vitals taken for this visit. BMI: Estimated body mass index is 26.26 kg/m as calculated from the following:   Height as of this encounter: 5\' 4"  (1.626 m).   Weight as of this encounter: 153 lb (69.4 kg).  Risk Assessment: Allergies: Reviewed. She is allergic to other and food.  Allergy Precautions: None required Coagulopathies: Reviewed. None identified.  Blood-thinner therapy: None at this time Active Infection(s): Reviewed. None identified. Nicole Walter is afebrile  Site Confirmation: Nicole Walter was asked to confirm the procedure and laterality before marking the site Procedure checklist: Completed Consent: Before the procedure and under the influence of no sedative(s), amnesic(s), or anxiolytics, the patient was informed of the treatment options, risks and possible complications. To fulfill our ethical and legal obligations, as recommended by the American Medical Association's Code of Ethics, I have informed the patient of my clinical impression; the nature and purpose of the treatment or procedure; the risks, benefits, and possible complications of the intervention; the alternatives, including doing nothing; the risk(s) and benefit(s) of the alternative treatment(s) or procedure(s); and the risk(s) and benefit(s) of doing nothing. The patient was provided information about the general risks and possible complications associated with the procedure. These may include, but are not limited to: failure to achieve desired goals, infection, bleeding, organ or nerve damage, allergic reactions, paralysis, and death. In addition, the patient was informed of those risks and complications associated to Spine-related procedures, such as failure to decrease pain; infection (i.e.: Meningitis, epidural or intraspinal abscess); bleeding (i.e.: epidural hematoma, subarachnoid hemorrhage, or any other type of intraspinal or  peri-dural bleeding); organ or nerve damage (i.e.: Any type of peripheral nerve, nerve root, or spinal cord injury) with subsequent damage to sensory, motor, and/or autonomic systems, resulting in permanent pain, numbness, and/or weakness of one or several areas of the body; allergic reactions; (i.e.:  anaphylactic reaction); and/or death. Furthermore, the patient was informed of those risks and complications associated with the medications. These include, but are not limited to: allergic reactions (i.e.: anaphylactic or anaphylactoid reaction(s)); adrenal axis suppression; blood sugar elevation that in diabetics may result in ketoacidosis or comma; water retention that in patients with history of congestive heart failure may result in shortness of breath, pulmonary edema, and decompensation with resultant heart failure; weight gain; swelling or edema; medication-induced neural toxicity; particulate matter embolism and blood vessel occlusion with resultant organ, and/or nervous system infarction; and/or aseptic necrosis of one or more joints. Finally, the patient was informed that Medicine is not an exact science; therefore, there is also the possibility of unforeseen or unpredictable risks and/or possible complications that may result in a catastrophic outcome. The patient indicated having understood very clearly. We have given the patient no guarantees and we have made no promises. Enough time was given to the patient to ask questions, all of which were answered to the patient's satisfaction. Nicole Walter has indicated that she wanted to continue with the procedure. Attestation: I, the ordering provider, attest that I have discussed with the patient the benefits, risks, side-effects, alternatives, likelihood of achieving goals, and potential problems during recovery for the procedure that I have provided informed consent. Date: 08/16/2017; Time: 7:44 AM  Pre-Procedure Preparation:  Monitoring: As per clinic  protocol. Respiration, ETCO2, SpO2, BP, heart rate and rhythm monitor placed and checked for adequate function Safety Precautions: Patient was assessed for positional comfort and pressure points before starting the procedure. Time-out: I initiated and conducted the "Time-out" before starting the procedure, as per protocol. The patient was asked to participate by confirming the accuracy of the "Time Out" information. Verification of the correct person, site, and procedure were performed and confirmed by me, the nursing staff, and the patient. "Time-out" conducted as per Joint Commission's Universal Protocol (UP.01.01.01). "Time-out" Date & Time: 08/16/2017; 0907 hrs.  Description of Procedure Process:   Position: Prone with head of the table was raised to facilitate breathing. Target Area: For Cervical Facet blocks, the target is the postero-lateral waist of the articular pillars at the C3, C4, C5, C6, & C7 levels. Approach: Posterior approach. Area Prepped: Entire Posterior Cervico-thoracic Region Prepping solution: ChloraPrep (2% chlorhexidine gluconate and 70% isopropyl alcohol) Safety Precautions: Aspiration looking for blood return was conducted prior to all injections. At no point did we inject any substances, as a needle was being advanced. No attempts were made at seeking any paresthesias. Safe injection practices and needle disposal techniques used. Medications properly checked for expiration dates. SDV (single dose vial) medications used. Description of the Procedure: Protocol guidelines were followed. The patient was placed in position over the fluoroscopy table. The target area was identified and the area prepped in the usual manner. Skin desensitized using vapocoolant spray. Skin & deeper tissues infiltrated with local anesthetic. Appropriate amount of time allowed to pass for local anesthetics to take effect. The procedure needle was introduced through the skin, ipsilateral to the reported  pain, and advanced to the target area. Bone was contacted on the posterior aspect of the articular pillars and the needle walked lateral, until the border was cleared. Lateral views taken to make sure the needle tip did not advance past the posterior third of the lateral mass of the posterior columns. The procedure was repeated in identical fashion for each level. Negative aspiration confirmed. Solution injected in intermittent fashion, asking for systemic symptoms every 0.5cc of injectate. The needles  were then removed and the area cleansed, making sure to leave some of the prepping solution back to take advantage of its long term bactericidal properties. Vitals:   08/16/17 0920 08/16/17 0929 08/16/17 0939 08/16/17 0949  BP: 120/74 100/72 112/72 122/66  Pulse: 72     Resp: 13 (!) 7 (!) 22 16  Temp:  98.2 F (36.8 C)  (!) 97.5 F (36.4 C)  TempSrc:  Temporal    SpO2: 98% 93% 95% 96%  Weight:      Height:        Start Time: 0907 hrs. End Time:   hrs. Materials:  Needle(s) Type: Regular needle Gauge: 22G Length: 3.5-in Medication(s): We administered lactated ringers, midazolam, fentaNYL, lidocaine, dexamethasone, ropivacaine (PF) 2 mg/mL (0.2%), dexamethasone, and ropivacaine (PF) 2 mg/mL (0.2%). Please see chart orders for dosing details.  Imaging Guidance (Spinal):  Type of Imaging Technique: Fluoroscopy Guidance (Spinal) Indication(s): Assistance in needle guidance and placement for procedures requiring needle placement in or near specific anatomical locations not easily accessible without such assistance. Exposure Time: Please see nurses notes. Contrast: None used. Fluoroscopic Guidance: I was personally present during the use of fluoroscopy. "Tunnel Vision Technique" used to obtain the best possible view of the target area. Parallax error corrected before commencing the procedure. "Direction-depth-direction" technique used to introduce the needle under continuous pulsed fluoroscopy.  Once target was reached, antero-posterior, oblique, and lateral fluoroscopic projection used confirm needle placement in all planes. Images permanently stored in EMR. Interpretation: No contrast injected. I personally interpreted the imaging intraoperatively. Adequate needle placement confirmed in multiple planes. Permanent images saved into the patient's record.  Antibiotic Prophylaxis:  Indication(s): None identified Antibiotic given: None  Post-operative Assessment:  EBL: None Complications: No immediate post-treatment complications observed by team, or reported by patient. Note: The patient tolerated the entire procedure well. A repeat set of vitals were taken after the procedure and the patient was kept under observation following institutional policy, for this type of procedure. Post-procedural neurological assessment was performed, showing return to baseline, prior to discharge. The patient was provided with post-procedure discharge instructions, including a section on how to identify potential problems. Should any problems arise concerning this procedure, the patient was given instructions to immediately contact us, at any time, without hesitation. In any case, we plan to contact the patient by telephone for a follow-up status report regarding this interventional procedure. Comments:  No additional relevant information.  Plan of Care    Imaging Orders     DG C-Arm 1-60 Min-No Report  Procedure Orders     CERVICAL FACET (MEDIAL BRANCH NERVE BLOCK)   Medications ordered for procedure: Meds ordered this encounter  Medications  . lactated ringers infusion 1,000 mL  . midazolam (VERSED) 5 MG/5ML injection 1-2 mg    Make sure Flumazenil is available in the pyxis when using this medication. If oversedation occurs, administer 0.2 mg IV over 15 sec. If after 45 sec no response, administer 0.2 mg again over 1 min; may repeat at 1 min intervals; not to exceed 4 doses (1 mg)  . fentaNYL  (SUBLIMAZE) injection 25-50 mcg    Make sure Narcan is available in the pyxis when using this medication. In the event of respiratory depression (RR< 8/min): Titrate NARCAN (naloxone) in increments of 0.1 to 0.2 mg IV at 2-3 minute intervals, until desired degree of reversal.  . lidocaine (XYLOCAINE) 2 % (with pres) injection 200 mg  . dexamethasone (DECADRON) injection 10 mg  . ropivacaine (PF) 2  mg/mL (0.2%) (NAROPIN) injection 9 mL  . dexamethasone (DECADRON) injection 10 mg  . ropivacaine (PF) 2 mg/mL (0.2%) (NAROPIN) injection 9 mL   Medications administered: We administered lactated ringers, midazolam, fentaNYL, lidocaine, dexamethasone, ropivacaine (PF) 2 mg/mL (0.2%), dexamethasone, and ropivacaine (PF) 2 mg/mL (0.2%).  See the medical record for exact dosing, route, and time of administration.  New Prescriptions   No medications on file   Disposition: Discharge home  Discharge Date & Time: 08/16/2017; 1000 hrs.   Physician-requested Follow-up: Return for post-procedure eval by Dr. Dossie Arbour in 2 wks. Future Appointments Date Time Provider [place]  08/24/2017 11:30 AM Leone Haven, MD [facility] None  09/03/2017 8:15 AM Milinda Pointer, MD [facility] None  09/10/2017 9:00 AM Vevelyn Francois, NP [facility] None   Primary Care Physician: Leone Haven, MD Location: [facility] Note by: Gaspar Cola, MD Date: 08/16/2017; Time: 10:41 AM  Disclaimer:  Medicine is not an exact science. The only guarantee in medicine is that nothing is guaranteed. It is important to note that the decision to proceed with this intervention was based on the information collected from the patient. The Data and conclusions were drawn from the patient's questionnaire, the interview, and the physical examination. Because the information was provided in large part by the patient, it cannot be guaranteed that it has not been purposely or unconsciously  manipulated. Every effort has been made to obtain as much relevant data as possible for this evaluation. It is important to note that the conclusions that lead to this procedure are derived in large part from the available data. Always take into account that the treatment will also be dependent on availability of resources and existing treatment guidelines, considered by other Pain Management Practitioners as being common knowledge and practice, at the time of the intervention. For Medico-Legal purposes, it is also important to point out that variation in procedural techniques and pharmacological choices are the acceptable norm. The indications, contraindications, technique, and results of the above procedure should only be interpreted and judged by a Board-Certified Interventional Pain Specialist with extensive familiarity and expertise in the same exact procedure and technique.

## 2017-08-17 ENCOUNTER — Telehealth: Payer: Self-pay | Admitting: *Deleted

## 2017-08-17 ENCOUNTER — Telehealth: Payer: Self-pay | Admitting: Family Medicine

## 2017-08-17 NOTE — Telephone Encounter (Signed)
This should come from her pain specialist. Previously prescribed by them.

## 2017-08-17 NOTE — Telephone Encounter (Signed)
Pt called and is requesting a refill on her amitiza 73mcg 1 tablet daily. She states that she was prescribed this medication because of her constipation since taking the oxycodone. She states that her constipation is worse with adding the saxenda. Please advise, thank you!  Call pt@ [phone]

## 2017-08-17 NOTE — Telephone Encounter (Signed)
I do not see this on her list, please advise

## 2017-08-17 NOTE — Telephone Encounter (Signed)
Unable to reach patient as all circuits were busy. Will try again later

## 2017-08-20 NOTE — Telephone Encounter (Signed)
Patient notified

## 2017-08-23 ENCOUNTER — Telehealth: Payer: Self-pay | Admitting: Family Medicine

## 2017-08-23 NOTE — Telephone Encounter (Signed)
Attempted to reach patient, left a message thanks

## 2017-08-23 NOTE — Telephone Encounter (Signed)
Pt would like to know if she needs labs done tomorrow for her appt? Please advise?  Call pt @ [phone].

## 2017-08-23 NOTE — Telephone Encounter (Signed)
Please advise, thanks.

## 2017-08-23 NOTE — Telephone Encounter (Signed)
No labs needed

## 2017-08-24 ENCOUNTER — Encounter: Payer: Self-pay | Admitting: Family Medicine

## 2017-08-24 ENCOUNTER — Ambulatory Visit (INDEPENDENT_AMBULATORY_CARE_PROVIDER_SITE_OTHER): Payer: BLUE CROSS/BLUE SHIELD | Admitting: Family Medicine

## 2017-08-24 VITALS — BP 114/70 | HR 88 | Temp 98.1°F | Wt 154.0 lb

## 2017-08-24 DIAGNOSIS — K5900 Constipation, unspecified: Secondary | ICD-10-CM

## 2017-08-24 DIAGNOSIS — G8929 Other chronic pain: Secondary | ICD-10-CM | POA: Diagnosis not present

## 2017-08-24 DIAGNOSIS — K760 Fatty (change of) liver, not elsewhere classified: Secondary | ICD-10-CM

## 2017-08-24 DIAGNOSIS — E785 Hyperlipidemia, unspecified: Secondary | ICD-10-CM

## 2017-08-24 DIAGNOSIS — R102 Pelvic and perineal pain: Secondary | ICD-10-CM | POA: Diagnosis not present

## 2017-08-24 DIAGNOSIS — E663 Overweight: Secondary | ICD-10-CM | POA: Diagnosis not present

## 2017-08-24 MED ORDER — LUBIPROSTONE 24 MCG PO CAPS: 24.0000 ug | ORAL_CAPSULE | Freq: Two times a day (BID) | ORAL | 0 refills | Status: DC

## 2017-08-24 NOTE — Assessment & Plan Note (Signed)
Prior lab workup unremarkable previously. Ultrasound with fatty liver. She's been working on diet and exercise. We'll have her follow up with GI regarding this and her chronic abdominal tenderness.

## 2017-08-24 NOTE — Assessment & Plan Note (Signed)
Improved.  Continue current medication. 

## 2017-08-24 NOTE — Progress Notes (Signed)
Tommi Rumps, MD Phone: [phone]  Nicole Walter is a 56 y.o. female who presents today for follow-up.  Overweight: She's been on Saxenda. Is doing quite well on this. Down 16-17 pounds since starting. Exercising 2-4 times a week. Watching what she eats. She does note some increased constipation with this accident. No nausea. No thyroid issues.  Hyperlipidemia: Taking simvastatin. No chest pain or claudication. No myalgias.  Constipation: She does take chronic narcotics. She was on Amitiza which was working fairly well for her. She has a bowel movement every other day. Does pass gas. No abdominal pain though does note chronic abdominal tenderness. She's been using mineral oil and Dulcolax since she ran out of the Amitiza. She notes her pain management specialist denied a refill until she follows up with them.  She notes chronic right pelvic discomfort that comes on intermittently and lasts briefly and goes away on its own. Has been going on since at least 2010. She is postmenopausal. She saw gynecology last year and they recommended possibly doing an ultrasound if it continued. She has had prior CT scans that showed no pelvic abnormalities.  PMH: Former smoker   ROS see history of present illness  Objective  Physical Exam Vitals:   08/24/17 1128  BP: 114/70  Pulse: 88  Temp: 98.1 F (36.7 C)  SpO2: 99%    BP Readings from Last 3 Encounters:  08/24/17 114/70  08/16/17 122/66  08/13/17 114/66   Wt Readings from Last 3 Encounters:  08/24/17 154 lb (69.9 kg)  08/16/17 153 lb (69.4 kg)  08/13/17 155 lb (70.3 kg)    Physical Exam  Constitutional: No distress.  Cardiovascular: Normal rate, regular rhythm and normal heart sounds.   Pulmonary/Chest: Effort normal and breath sounds normal.  Abdominal: Soft. Bowel sounds are normal. She exhibits no distension. There is no rebound and no guarding.  Chronic scattered mild tenderness  Musculoskeletal: She exhibits no edema.   Neurological: She is alert. Gait normal.  Skin: She is not diaphoretic.     Assessment/Plan: Please see individual problem list.  Chronic pelvic pain in female Chronic issues as been going on for many years. Possibly musculoskeletal. Prior CT scan in 2017 with no pelvic abnormalities. We will proceed with pelvic ultrasound to evaluate further.  Overweight (BMI 25.0-29.9) Weight trending down. Tolerating Saxenda. She'll continue this and diet and exercise. Recheck in 2 months.  Hyperlipidemia Improved. Continue current medication.  Constipation Chronic issue. Improved with starting on Amitiza. Refill given today for 1 month. Future refills to come from pain management.  Fatty liver Prior lab workup unremarkable previously. Ultrasound with fatty liver. She's been working on diet and exercise. We'll have her follow up with GI regarding this and her chronic abdominal tenderness.   Orders Placed This Encounter  Procedures  . US Pelvis Complete    Standing Status:   Future    Standing Expiration Date:   10/24/2018    Order Specific Question:   Reason for Exam (SYMPTOM  OR DIAGNOSIS REQUIRED)    Answer:   chronic intermittent right pelvic discomfort, 8+ years of symptoms    Order Specific Question:   Preferred imaging location?    Answer:   [place] ordered this encounter  Medications  . lubiprostone (AMITIZA) 24 MCG capsule    Sig: Take 1 capsule (24 mcg total) by mouth 2 (two) times daily with a meal.    Dispense:  60 capsule    Refill:  0  Tommi Rumps, MD [place]

## 2017-08-24 NOTE — Progress Notes (Signed)
Left message with patients husband to make follow up with GI

## 2017-08-24 NOTE — Assessment & Plan Note (Signed)
Chronic issue. Improved with starting on Amitiza. Refill given today for 1 month. Future refills to come from pain management.

## 2017-08-24 NOTE — Assessment & Plan Note (Signed)
Chronic issues as been going on for many years. Possibly musculoskeletal. Prior CT scan in 2017 with no pelvic abnormalities. We will proceed with pelvic ultrasound to evaluate further.

## 2017-08-24 NOTE — Patient Instructions (Signed)
Nice to see you. Please start back on the Amitiza for your constipation. Further refills to come from your pain management specialist. Please continue with diet and exercise. Please continue to Saxenda. If you develop nausea or any other side effects please let us know.

## 2017-08-24 NOTE — Assessment & Plan Note (Signed)
Weight trending down. Tolerating Saxenda. She'll continue this and diet and exercise. Recheck in 2 months.

## 2017-08-27 ENCOUNTER — Other Ambulatory Visit: Payer: Self-pay | Admitting: Family Medicine

## 2017-08-27 ENCOUNTER — Other Ambulatory Visit: Payer: Self-pay | Admitting: Student

## 2017-08-27 DIAGNOSIS — M542 Cervicalgia: Secondary | ICD-10-CM

## 2017-08-28 ENCOUNTER — Telehealth: Payer: Self-pay | Admitting: Family Medicine

## 2017-08-28 DIAGNOSIS — R102 Pelvic and perineal pain: Principal | ICD-10-CM

## 2017-08-28 DIAGNOSIS — G8929 Other chronic pain: Secondary | ICD-10-CM

## 2017-08-28 NOTE — Telephone Encounter (Signed)
Ordered

## 2017-08-28 NOTE — Telephone Encounter (Signed)
Scheduling department called stating that the [place] order needs to be added non transvaginal non ob. Thank you!

## 2017-08-31 ENCOUNTER — Ambulatory Visit: Payer: BLUE CROSS/BLUE SHIELD

## 2017-09-01 ENCOUNTER — Ambulatory Visit: Payer: BLUE CROSS/BLUE SHIELD

## 2017-09-03 ENCOUNTER — Ambulatory Visit: Payer: BLUE CROSS/BLUE SHIELD | Admitting: Pain Medicine

## 2017-09-04 ENCOUNTER — Ambulatory Visit
Admission: RE | Admit: 2017-09-04 | Discharge: 2017-09-04 | Disposition: A | Discharge status: Home / Self Care | Payer: BLUE CROSS/BLUE SHIELD | Source: Ambulatory Visit | Attending: Student | Admitting: Student

## 2017-09-04 ENCOUNTER — Ambulatory Visit: Payer: BLUE CROSS/BLUE SHIELD

## 2017-09-04 ENCOUNTER — Other Ambulatory Visit: Payer: Self-pay | Admitting: Family Medicine

## 2017-09-04 ENCOUNTER — Ambulatory Visit
Admission: RE | Admit: 2017-09-04 | Discharge: 2017-09-04 | Disposition: A | Discharge status: Home / Self Care | Payer: BLUE CROSS/BLUE SHIELD | Source: Ambulatory Visit | Attending: Family Medicine | Admitting: Family Medicine

## 2017-09-04 DIAGNOSIS — M5031 Other cervical disc degeneration,  high cervical region: Secondary | ICD-10-CM | POA: Insufficient documentation

## 2017-09-04 DIAGNOSIS — M4802 Spinal stenosis, cervical region: Secondary | ICD-10-CM | POA: Diagnosis not present

## 2017-09-04 DIAGNOSIS — R102 Pelvic and perineal pain: Secondary | ICD-10-CM | POA: Insufficient documentation

## 2017-09-04 DIAGNOSIS — R9389 Abnormal findings on diagnostic imaging of other specified body structures: Secondary | ICD-10-CM | POA: Insufficient documentation

## 2017-09-04 DIAGNOSIS — G8929 Other chronic pain: Secondary | ICD-10-CM | POA: Insufficient documentation

## 2017-09-04 DIAGNOSIS — M5021 Other cervical disc displacement,  high cervical region: Secondary | ICD-10-CM | POA: Insufficient documentation

## 2017-09-04 DIAGNOSIS — M542 Cervicalgia: Secondary | ICD-10-CM

## 2017-09-06 ENCOUNTER — Other Ambulatory Visit: Payer: Self-pay | Admitting: Family Medicine

## 2017-09-06 DIAGNOSIS — R935 Abnormal findings on diagnostic imaging of other abdominal regions, including retroperitoneum: Secondary | ICD-10-CM

## 2017-09-06 NOTE — Progress Notes (Unsigned)
Patient is wondering if she can discontinue her cholesterol medication simvastatin ? When last labs were checked cholesterol had improved.   She states since it has been cholesterol medication was  increased she has been having increased shoulder and arm pain and thinks it was related to medication.
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
## 2017-09-10 ENCOUNTER — Ambulatory Visit: Payer: BLUE CROSS/BLUE SHIELD | Attending: Nurse Practitioner | Admitting: Nurse Practitioner

## 2017-09-10 ENCOUNTER — Telehealth: Payer: Self-pay | Admitting: Family Medicine

## 2017-09-10 VITALS — BP 112/71 | HR 77 | Temp 98.4°F | Resp 16 | Ht 64.0 in | Wt 152.0 lb

## 2017-09-10 DIAGNOSIS — Z79899 Other long term (current) drug therapy: Secondary | ICD-10-CM | POA: Insufficient documentation

## 2017-09-10 DIAGNOSIS — F419 Anxiety disorder, unspecified: Secondary | ICD-10-CM | POA: Diagnosis not present

## 2017-09-10 DIAGNOSIS — G8929 Other chronic pain: Secondary | ICD-10-CM

## 2017-09-10 DIAGNOSIS — Z79891 Long term (current) use of opiate analgesic: Secondary | ICD-10-CM | POA: Diagnosis not present

## 2017-09-10 DIAGNOSIS — R102 Pelvic and perineal pain: Secondary | ICD-10-CM | POA: Insufficient documentation

## 2017-09-10 DIAGNOSIS — M160 Bilateral primary osteoarthritis of hip: Secondary | ICD-10-CM | POA: Insufficient documentation

## 2017-09-10 DIAGNOSIS — M25561 Pain in right knee: Secondary | ICD-10-CM | POA: Diagnosis not present

## 2017-09-10 DIAGNOSIS — E7801 Familial hypercholesterolemia: Secondary | ICD-10-CM | POA: Insufficient documentation

## 2017-09-10 DIAGNOSIS — G894 Chronic pain syndrome: Secondary | ICD-10-CM

## 2017-09-10 DIAGNOSIS — M25512 Pain in left shoulder: Secondary | ICD-10-CM

## 2017-09-10 DIAGNOSIS — Z87891 Personal history of nicotine dependence: Secondary | ICD-10-CM | POA: Insufficient documentation

## 2017-09-10 DIAGNOSIS — M549 Dorsalgia, unspecified: Secondary | ICD-10-CM | POA: Diagnosis not present

## 2017-09-10 DIAGNOSIS — F329 Major depressive disorder, single episode, unspecified: Secondary | ICD-10-CM | POA: Diagnosis not present

## 2017-09-10 DIAGNOSIS — M7551 Bursitis of right shoulder: Secondary | ICD-10-CM | POA: Insufficient documentation

## 2017-09-10 DIAGNOSIS — R109 Unspecified abdominal pain: Secondary | ICD-10-CM

## 2017-09-10 DIAGNOSIS — Z5181 Encounter for therapeutic drug level monitoring: Secondary | ICD-10-CM | POA: Diagnosis not present

## 2017-09-10 DIAGNOSIS — M797 Fibromyalgia: Secondary | ICD-10-CM | POA: Diagnosis not present

## 2017-09-10 DIAGNOSIS — M8580 Other specified disorders of bone density and structure, unspecified site: Secondary | ICD-10-CM | POA: Insufficient documentation

## 2017-09-10 DIAGNOSIS — K760 Fatty (change of) liver, not elsewhere classified: Secondary | ICD-10-CM | POA: Diagnosis not present

## 2017-09-10 DIAGNOSIS — M542 Cervicalgia: Secondary | ICD-10-CM

## 2017-09-10 DIAGNOSIS — K5909 Other constipation: Secondary | ICD-10-CM | POA: Diagnosis not present

## 2017-09-10 DIAGNOSIS — M25511 Pain in right shoulder: Secondary | ICD-10-CM | POA: Diagnosis not present

## 2017-09-10 MED ORDER — OXYCODONE HCL 5 MG PO TABS: 5.0000 mg | ORAL_TABLET | Freq: Four times a day (QID) | ORAL | 0 refills | Status: DC | PRN

## 2017-09-10 MED ORDER — LUBIPROSTONE 24 MCG PO CAPS: 24.0000 ug | ORAL_CAPSULE | Freq: Two times a day (BID) | ORAL | 0 refills | Status: DC

## 2017-09-10 NOTE — Progress Notes (Signed)
Patient's Name: Nicole Walter  MRN: [id number]  Referring Provider: Leone Haven, MD  DOB: Aug 17, 1961  PCP: Leone Haven, MD  DOS: 09/10/2017  Note by: Vevelyn Francois NP  Service setting: Ambulatory outpatient  Specialty: Interventional Pain Management  Location: [facility] (AMB) Pain Management Facility    Patient type: Established    Primary Reason(s) for Visit: Encounter for prescription drug management & post-procedure evaluation of chronic illness with mild to moderate exacerbation(Level of risk: moderate) CC: Neck Pain (back of neck); Shoulder Pain (both); Back Pain (lower); and Hip Pain (right)  HPI  Nicole Walter is a 56 y.o. year old, female patient, who comes today for a post-procedure evaluation and medication management. She has Anxiety and depression; Chronic pain syndrome; Lumbar spondylosis; Combined fat and carbohydrate induced hyperlipemia; Thoracic back pain; Chronic neck pain (Primary Source of Pain) (Bilateral) (L>R); Concentration deficit; Hyperlipidemia; Cervical spondylosis (Bilateral); Fibromyalgia; Osteopenia; Skin lesions; Chronic hip pain (Bilateral) (L>R); Bursitis of right shoulder; Chronic Greater trochanteric bursitis (Bilateral) (L>R); Long term current use of opiate analgesic; Long term prescription opiate use; Opiate use; Chronic upper back pain (Secondary source of pain) (Bilateral) (L>R); Chronic shoulder pain [facility] source of pain) (Bilateral) (L>R); Chronic knee pain (Right); Chronic upper extremity pain (Left); Chronic lower extremity pain (Bilateral) (L>R); Chronic abdominal pain; Elevated sedimentation rate; Elevated C-reactive protein (CRP); Overweight (BMI 25.0-29.9); Greater trochanteric bursitis (Right); Subacromial bursitis of shoulder joint (Right); Osteoarthritis of hip (Bilateral) (L>R); Thoracic radiculitis (R>L); Thoracic spondylosis; Shortness of breath; Osteoarthritis of shoulder (Right); Osteoarthritis of knee (Right); Chronic knee arthropathy  (Right); Chronic right hip pain; Muscle spasm; Myofascial pain syndrome, cervical (trapezius) (Left); Cervical facet syndrome (Bilateral) (L>R); Constipation; Chronic low back pain (Bilateral) (L>R); Cervical Foraminal Stenosis (Severe) (Bilateral: C-5-6); Cervical spondylosis with radiculopathy (Bilateral) (L>R); Cervical sensory radiculopathy at C5 (Bilateral); Radicular pain of shoulder (Bilateral) (C5); Chronic pelvic pain in female; and Fatty liver on their problem list. Her primarily concern today is the Neck Pain (back of neck); Shoulder Pain (both); Back Pain (lower); and Hip Pain (right)  Pain Assessment: Location: Posterior Neck Radiating:   Onset: More than a month ago Duration:   Quality: Radiating, Aching, Constant(goes around the neck) Severity: 2 /10 (self-reported pain score)  Note: Reported level is compatible with observation.                     Effect on ADL: pace self Timing: Constant Modifying factors: medicine , sometimes ice and heat  Nicole Walter was last seen on 08/16/2017 for a procedure. During today's appointment we reviewed Nicole Walter's post-procedure results, as well as her outpatient medication regimen. She admits that the pain is getting worse. She admits that she is not able to rest at night. She admits that she has numbness and tingling in her arms. She states that her hands are tight and she is dropping things. She states that her "grip is off". She did have an MRI after being referred to the surgeon. She has not seen the results at this time. She will follow up with surgeon but does not have a current appointment.   Further details on both, my assessment(s), as well as the proposed treatment plan, please see below.  Controlled Substance Pharmacotherapy Assessment REMS (Risk Evaluation and Mitigation Strategy)  Analgesic:Oxycodone one tablet by mouth 4times a day (33m/dayof hydrocodone) MME/day:382mday  PoLona MillardRN  09/10/2017  9:40 AM  Sign at  close encounter Nursing Pain Medication Assessment:  Safety precautions to  be maintained throughout the outpatient stay will include: orient to surroundings, keep bed in low position, maintain call bell within reach at all times, provide assistance with transfer out of bed and ambulation.  Medication Inspection Compliance: Pill count conducted under aseptic conditions, in front of the patient. Neither the pills nor the bottle was removed from the patient's sight at any time. Once count was completed pills were immediately returned to the patient in their original bottle.  Medication: Oxycodone IR Pill/Patch Count: 0 of 120 pills remain Pill/Patch Appearance: no pills available to verify Bottle Appearance: Standard pharmacy container. Clearly labeled. Filled Date: 53 / 06 / 2018 Last Medication intake:  Today   Pharmacokinetics: Liberation and absorption (onset of action): WNL Distribution (time to peak effect): WNL Metabolism and excretion (duration of action): WNL         Pharmacodynamics: Desired effects: Analgesia: Nicole Walter reports >50% benefit. Functional ability: Patient reports that medication allows her to accomplish basic ADLs Clinically meaningful improvement in function (CMIF): Sustained CMIF goals met Perceived effectiveness: Described as relatively effective, allowing for increase in activities of daily living (ADL) Undesirable effects: Side-effects or Adverse reactions: None reported Monitoring: [place] PMP: Online review of the past 65-monthperiod conducted. Compliant with practice rules and regulations Last UDS on record: Summary  Date Value Ref Range Status  03/19/2017 FINAL  Final    Comment:    ==================================================================== TOXASSURE SELECT 13 (MW) ==================================================================== Test                             Result       Flag       Units Drug Present and Declared for Prescription  Verification   Oxycodone                      512          EXPECTED   ng/mg creat   Oxymorphone                    446          EXPECTED   ng/mg creat   Noroxycodone                   1331         EXPECTED   ng/mg creat   Noroxymorphone                 256          EXPECTED   ng/mg creat    Sources of oxycodone are scheduled prescription medications.    Oxymorphone, noroxycodone, and noroxymorphone are expected    metabolites of oxycodone. Oxymorphone is also available as a    scheduled prescription medication. ==================================================================== Test                      Result    Flag   Units      Ref Range   Creatinine              111              mg/dL      >=20 ==================================================================== Declared Medications:  The flagging and interpretation on this report are based on the  following declared medications.  Unexpected results may arise from  inaccuracies in the declared medications.  **Note: The testing scope of this panel includes these medications:  Oxycodone  **  Note: The testing scope of this panel does not include following  reported medications:  Fluticasone (Flonase)  Gabapentin  Hydroxyzine (Vistaril)  Multivitamin  Rabeprazole (Aciphex)  Simvastatin (Zocor)  Sucralfate (Carafate)  Tizanidine (Zanaflex)  Topical (Lidex)  Trazodone  Vitamin D  Vortioxetine (Trintellix) ==================================================================== For clinical consultation, please call [phone]. ====================================================================    UDS interpretation: Compliant          Medication Assessment Form: Reviewed. Patient indicates being compliant with therapy Treatment compliance: Compliant Risk Assessment Profile: Aberrant behavior: See prior evaluations. None observed or detected today Comorbid factors increasing risk of overdose: See prior notes. No additional  risks detected today Risk of substance use disorder (SUD): Low Opioid Risk Tool - 09/10/17 0936      Family History of Substance Abuse   Alcohol  Negative    Illegal Drugs  Positive Female    Rx Drugs  Negative      Personal History of Substance Abuse   Alcohol  Negative    Illegal Drugs  Negative    Rx Drugs  Negative      Age   Age between 77-45 years   No      History of Preadolescent Sexual Abuse   History of Preadolescent Sexual Abuse  Negative or Female      Psychological Disease   Psychological Disease  Negative    ADD  Negative    OCD  Negative    Bipolar  Negative    Schizophrenia  Negative    Depression  Positive on medication   on medication     Total Score   Opioid Risk Tool Scoring  3    Opioid Risk Interpretation  Low Risk      ORT Scoring interpretation table:  Score <3 = Low Risk for SUD  Score between 4-7 = Moderate Risk for SUD  Score >8 = High Risk for Opioid Abuse   Risk Mitigation Strategies:  Patient Counseling: Covered Patient-Prescriber Agreement (PPA): Present and active  Notification to other healthcare providers: Done  Pharmacologic Plan: No change in therapy, at this time  Post-Procedure Assessment  08/16/2017 Procedure: Bilateral Cervical Facet  Pre-procedure pain score:        /10 Post-procedure pain score: 0/10         Influential Factors: BMI: 26.09 kg/m Intra-procedural challenges: None observed.         Assessment challenges: None detected.              Reported side-effects: None.        Post-procedural adverse reactions or complications: None reported         Sedation: Please see nurses note. When no sedatives are used, the analgesic levels obtained are directly associated to the effectiveness of the local anesthetics. However, when sedation is provided, the level of analgesia obtained during the initial 1 hour following the intervention, is believed to be the result of a combination of factors. These factors may include, but  are not limited to: 1. The effectiveness of the local anesthetics used. 2. The effects of the analgesic(s) and/or anxiolytic(s) used. 3. The degree of discomfort experienced by the patient at the time of the procedure. 4. The patients ability and reliability in recalling and recording the events. 5. The presence and influence of possible secondary gains and/or psychosocial factors. Reported result: Relief experienced during the 1st hour after the procedure: 100 % (Ultra-Short Term Relief)            Interpretative annotation: Clinically  appropriate result. Analgesia during this period is likely to be Local Anesthetic and/or IV Sedative (Analgesic/Anxiolytic) related.          Effects of local anesthetic: The analgesic effects attained during this period are directly associated to the localized infiltration of local anesthetics and therefore cary significant diagnostic value as to the etiological location, or anatomical origin, of the pain. Expected duration of relief is directly dependent on the pharmacodynamics of the local anesthetic used. Long-acting (4-6 hours) anesthetics used.  Reported result: Relief during the next 4 to 6 hour after the procedure: 100 % (Short-Term Relief)            Interpretative annotation: Clinically appropriate result. Analgesia during this period is likely to be Local Anesthetic-related.          Long-term benefit: Defined as the period of time past the expected duration of local anesthetics (1 hour for short-acting and 4-6 hours for long-acting). With the possible exception of prolonged sympathetic blockade from the local anesthetics, benefits during this period are typically attributed to, or associated with, other factors such as analgesic sensory neuropraxia, antiinflammatory effects, or beneficial biochemical changes provided by agents other than the local anesthetics.  Reported result: Extended relief following procedure: 0 %(pain came back that evening) (Long-Term  Relief)            Interpretative annotation: Clinically appropriate result. Good relief. No permanent benefit expected. Inflammation plays a part in the etiology to the pain.          Current benefits: Defined as reported results that persistent at this point in time.   Analgesia: 0 %            Function: No benefit ROM: No benefit Interpretative annotation: Recurrence of symptoms. No permanent benefit expected.             Interpretation: Results would suggest failure of therapy in achieving desired goal(s).                  Plan:  Please see "Plan of Care" for details.        Laboratory Chemistry  Inflammation Markers (CRP: Acute Phase) (ESR: Chronic Phase) Lab Results  Component Value Date   CRP 2.1 (H) 07/20/2016   ESRSEDRATE 2 05/15/2017                 Renal Function Markers Lab Results  Component Value Date   BUN 11 07/19/2017   CREATININE 0.96 07/19/2017   GFRAA >60 07/20/2016   GFRNONAA >60 07/20/2016                 Hepatic Function Markers Lab Results  Component Value Date   AST 21 07/19/2017   ALT 20 07/19/2017   ALBUMIN 4.3 07/19/2017   ALKPHOS 58 07/19/2017   HCVAB NEGATIVE 02/14/2017                 Electrolytes Lab Results  Component Value Date   NA 141 07/19/2017   K 3.6 07/19/2017   CL 105 07/19/2017   CALCIUM 9.5 07/19/2017   MG 2.2 05/15/2017                 Neuropathy Markers Lab Results  Component Value Date   VITAMINB12 987 (H) 07/20/2016                 Bone Pathology Markers Lab Results  Component Value Date   ALKPHOS 58 07/19/2017   25OHVITD1 58 07/20/2016  25OHVITD2 <1.0 07/20/2016   25OHVITD3 58 07/20/2016   CALCIUM 9.5 07/19/2017                 Coagulation Parameters Lab Results  Component Value Date   PLT 300 01/15/2017                 Cardiovascular Markers Lab Results  Component Value Date   HGB 12.4 01/15/2017   HCT 37.8 01/15/2017                 Note: Lab results reviewed.  Recent Diagnostic  Imaging Results   Complexity Note: Imaging results reviewed. Results shared with Ms. Waterhouse, using Layman's terms.                         Meds   Current Outpatient Medications:  .  BD ULTRA-FINE MICRO PEN NEEDLE 32G X 6 MM MISC, use once daily, Disp: 100 each, Rfl: 0 .  Calcium Carbonate-Vit D-Min (CALCIUM 1200 PO), Take by mouth., Disp: , Rfl:  .  Cholecalciferol (VITAMIN D) 2000 units tablet, Take 4,000 Units by mouth daily., Disp: , Rfl:  .  fluocinonide ointment (LIDEX) 0.05 %, APPLY TOPICALLY TWICE A DAY TO ITCHY AREAS ONLY. as needed, Disp: , Rfl: 0 .  fluticasone (FLONASE) 50 MCG/ACT nasal spray, instill 2 sprays into each nostril once daily, Disp: 16 g, Rfl: 11 .  lubiprostone (AMITIZA) 24 MCG capsule, Take 1 capsule (24 mcg total) 2 (two) times daily with a meal by mouth., Disp: 60 capsule, Rfl: 0 .  Multiple Vitamin (MULTI-VITAMINS) TABS, Take 1 tablet by mouth daily. , Disp: , Rfl:  .  [START ON 12/09/2017] oxyCODONE (OXY IR/ROXICODONE) 5 MG immediate release tablet, Take 1 tablet (5 mg total) every 6 (six) hours as needed by mouth for severe pain., Disp: 120 tablet, Rfl: 0 .  [START ON 11/09/2017] oxyCODONE (OXY IR/ROXICODONE) 5 MG immediate release tablet, Take 1 tablet (5 mg total) every 6 (six) hours as needed by mouth for severe pain., Disp: 120 tablet, Rfl: 0 .  RABEprazole (ACIPHEX) 20 MG tablet, Take 20 mg by mouth as needed. , Disp: , Rfl:  .  SAXENDA 18 MG/3ML SOPN, Inject 3 mg into the skin daily., Disp: 5 pen, Rfl: 1 .  simvastatin (ZOCOR) 20 MG tablet, take 1 tablet by mouth at bedtime, Disp: 90 tablet, Rfl: 1 .  sucralfate (CARAFATE) 1 g tablet, take 1 tablet by mouth three times a day if needed for abdominal pain, Disp: , Rfl: 0 .  [START ON 10/10/2017] oxyCODONE (OXY IR/ROXICODONE) 5 MG immediate release tablet, Take 1 tablet (5 mg total) every 6 (six) hours as needed by mouth for severe pain., Disp: 120 tablet, Rfl: 0  ROS  Constitutional: Denies any fever or  chills Gastrointestinal: No reported hemesis, hematochezia, vomiting, or acute GI distress Musculoskeletal: Denies any acute onset joint swelling, redness, loss of ROM, or weakness Neurological: No reported episodes of acute onset apraxia, aphasia, dysarthria, agnosia, amnesia, paralysis, loss of coordination, or loss of consciousness  Allergies  Ms. Espericueta is allergic to other and food.  PFSH  Drug: Ms. Britz  reports that she does not use drugs. Alcohol:  reports that she does not drink alcohol. Tobacco:  reports that she quit smoking about 2 years ago. She started smoking about 43 years ago. She has a 35.00 pack-year smoking history. she has never used smokeless tobacco. Medical:  has a past medical history of  Abnormal liver enzymes (02/08/2016), Allergy, Anxiety, Chest pain (07/05/2016), Chronic pain, Depression, DJD (degenerative joint disease), Fibromyalgia, High cholesterol, Rash of back (01/28/2016), and Weight loss due to medication. Surgical: Ms. Blecher  has a past surgical history that includes Cholecystectomy; Tubal ligation; Ankle surgery; Nasal sinus surgery; Tonsillectomy; Colonoscopy; Breast biopsy (Left, 1997); ESOPHAGOGASTRODUODENOSCOPY (EGD) WITH PROPOFOL (N/A, 02/22/2016); and COLONOSCOPY WITH PROPOFOL (N/A, 02/22/2016). Family: family history includes Arthritis in her father, paternal grandfather, and paternal grandmother; Cancer in her brother and father; Dementia in her sister; Diabetes in her sister; Drug abuse in her brother, brother, and sister; Heart disease in her mother; Post-traumatic stress disorder in her brother; Stroke in her mother.  Constitutional Exam  General appearance: Well nourished, well developed, and well hydrated. In no apparent acute distress Vitals:   09/10/17 0930  BP: 112/71  Pulse: 77  Resp: 16  Temp: 98.4 F (36.9 C)  SpO2: 100%  Weight: 152 lb (68.9 kg)  Height: _0  (1.626 m)  Psych/Mental status: Alert, oriented x 3 (person, place, & time)        Eyes: PERLA Respiratory: No evidence of acute respiratory distress  Cervical Spine Area Exam  Skin & Axial Inspection: No masses, redness, edema, swelling, or associated skin lesions Alignment: Symmetrical Functional ROM: Diminished ROM      Stability: No instability detected Muscle Tone/Strength: Functionally intact. No obvious neuro-muscular anomalies detected. Sensory (Neurological): Unimpaired Palpation: No palpable anomalies              Upper Extremity (UE) Exam    Side: Right upper extremity  Side: Left upper extremity  Skin & Extremity Inspection: Skin color, temperature, and hair growth are WNL. No peripheral edema or cyanosis. No masses, redness, swelling, asymmetry, or associated skin lesions. No contractures.  Skin & Extremity Inspection: Skin color, temperature, and hair growth are WNL. No peripheral edema or cyanosis. No masses, redness, swelling, asymmetry, or associated skin lesions. No contractures.  Functional ROM: Unrestricted ROM          Functional ROM: Unrestricted ROM          Muscle Tone/Strength: Functionally intact. No obvious neuro-muscular anomalies detected.  Muscle Tone/Strength: Functionally intact. No obvious neuro-muscular anomalies detected.  Sensory (Neurological): Unimpaired          Sensory (Neurological): Unimpaired          Palpation: No palpable anomalies              Palpation: No palpable anomalies              Specialized Test(s): Deferred         Specialized Test(s): Deferred          Thoracic Spine Area Exam  Skin & Axial Inspection: No masses, redness, or swelling Alignment: Symmetrical Functional ROM: Unrestricted ROM Stability: No instability detected Muscle Tone/Strength: Functionally intact. No obvious neuro-muscular anomalies detected. Sensory (Neurological): Unimpaired Muscle strength & Tone: No palpable anomalies  Lumbar Spine Area Exam  Skin & Axial Inspection: No masses, redness, or swelling Alignment: Symmetrical Functional  ROM: Unrestricted ROM      Stability: No instability detected Muscle Tone/Strength: Functionally intact. No obvious neuro-muscular anomalies detected. Sensory (Neurological): Unimpaired Palpation: No palpable anomalies       Provocative Tests: Lumbar Hyperextension and rotation test: evaluation deferred today       Lumbar Lateral bending test: evaluation deferred today       Patrick's Maneuver: evaluation deferred today  Gait & Posture Assessment  Ambulation: Unassisted Gait: Relatively normal for age and body habitus Posture: WNL   Lower Extremity Exam    Side: Right lower extremity  Side: Left lower extremity  Skin & Extremity Inspection: Skin color, temperature, and hair growth are WNL. No peripheral edema or cyanosis. No masses, redness, swelling, asymmetry, or associated skin lesions. No contractures.  Skin & Extremity Inspection: Skin color, temperature, and hair growth are WNL. No peripheral edema or cyanosis. No masses, redness, swelling, asymmetry, or associated skin lesions. No contractures.  Functional ROM: Unrestricted ROM          Functional ROM: Unrestricted ROM          Muscle Tone/Strength: Functionally intact. No obvious neuro-muscular anomalies detected.  Muscle Tone/Strength: Functionally intact. No obvious neuro-muscular anomalies detected.  Sensory (Neurological): Unimpaired  Sensory (Neurological): Unimpaired  Palpation: No palpable anomalies  Palpation: No palpable anomalies   Assessment  Primary Diagnosis & Pertinent Problem List: The primary encounter diagnosis was Chronic neck pain (Primary Source of Pain) (Bilateral) (L>R). Diagnoses of Chronic shoulder pain (Tertiary source of pain) (Bilateral) (L>R), Chronic upper back pain (Secondary source of pain) (Bilateral) (L>R), Chronic pain syndrome, and Chronic constipation were also pertinent to this visit.  Status Diagnosis  Worsening Worsening Worsening 1. Chronic neck pain (Primary Source of  Pain) (Bilateral) (L>R)   2. Chronic shoulder pain (Tertiary source of pain) (Bilateral) (L>R)   3. Chronic upper back pain (Secondary source of pain) (Bilateral) (L>R)   4. Chronic pain syndrome   5. Chronic constipation     Problems updated and reviewed during this visit: No problems updated. Plan of Care  Pharmacotherapy (Medications Ordered): Meds ordered this encounter  Medications  . oxyCODONE (OXY IR/ROXICODONE) 5 MG immediate release tablet    Sig: Take 1 tablet (5 mg total) every 6 (six) hours as needed by mouth for severe pain.    Dispense:  120 tablet    Refill:  0    Do not place this medication, or any other prescription from our practice, on "Automatic Refill". Patient may have prescription filled one day early if pharmacy is closed on scheduled refill date. Do not fill until:12/09/2017 To last until: 01/08/2018    Order Specific Question:   Supervising Provider    Answer:   Milinda Pointer [phone]  . oxyCODONE (OXY IR/ROXICODONE) 5 MG immediate release tablet    Sig: Take 1 tablet (5 mg total) every 6 (six) hours as needed by mouth for severe pain.    Dispense:  120 tablet    Refill:  0    Do not place this medication, or any other prescription from our practice, on "Automatic Refill". Patient may have prescription filled one day early if pharmacy is closed on scheduled refill date. Do not fill until:11/09/2017 To last until:12/09/2017    Order Specific Question:   Supervising Provider    Answer:   Milinda Pointer [phone]  . oxyCODONE (OXY IR/ROXICODONE) 5 MG immediate release tablet    Sig: Take 1 tablet (5 mg total) every 6 (six) hours as needed by mouth for severe pain.    Dispense:  120 tablet    Refill:  0    Do not place this medication, or any other prescription from our practice, on "Automatic Refill". Patient may have prescription filled one day early if pharmacy is closed on scheduled refill date. Do not fill until:10/10/2017 To last until:11/09/2017     Order Specific Question:   Supervising Provider  AnswerMilinda Pointer [[id number]]  . lubiprostone (AMITIZA) 24 MCG capsule    Sig: Take 1 capsule (24 mcg total) 2 (two) times daily with a meal by mouth.    Dispense:  60 capsule    Refill:  0    Order Specific Question:   Supervising Provider    Answer:   Milinda Pointer [phone]  This SmartLink is deprecated. Use AVSMEDLIST instead to display the medication list for a patient. Medications administered today: Airianna Kreischer had no medications administered during this visit. Lab-work, procedure(s), and/or referral(s): No orders of the defined types were placed in this encounter.  Imaging and/or referral(s): None  Interventional therapies: Planned, scheduled, and/or pending:   Diagnostic bilateral cervical facet block   Considering:   Diagnostic bilateral cervical facet block Possible bilateral cervical facet radiofrequencyablation Diagnostic left-sided cervical epidural steroid injection Diagnostic bilateral intra-articular shoulder joint injection Diagnostic bilateral suprascapular nerve block Possible bilateral suprascapular nerve radiofrequencyablation Diagnostic bilateral lumbar facet block Possible bilateral lumbar facet radiofrequencyablation  Diagnostic bilateral intra-articular hip joint injection Diagnostic bilateral femoral and obturator tickler branch blocks Possible bilateral hip joint radiofrequencyablation  Diagnosticright intra-articular knee joint injection Possible series of 5, right-sided, intra-articular knee joint injections with Hyalgan.  Possible right sided genicular nerve block Possible right sided genicular nerve radiofrequencyablation Possible left-sided lumbar epidural steroid injection   Palliative PRN treatment(s):   Palliativebilateral suprascapular nerve block Palliativeright intra-articular knee joint injection    Provider-requested follow-up: Return in about 3 months  (around 12/11/2017) for MedMgmt.  Future Appointments  Date Time Provider [place]  10/24/2017  8:30 AM Leone Haven, MD [facility] None  12/03/2017  8:30 AM Vevelyn Francois, NP [facility] None   Primary Care Physician: Leone Haven, MD Location: [facility] Note by: Vevelyn Francois NP Date: 09/10/2017; Time: 12:49 PM  Pain Score Disclaimer: We use the NRS-11 scale. This is a self-reported, subjective measurement of pain severity with only modest accuracy. It is used primarily to identify changes within a particular patient. It must be understood that outpatient pain scales are significantly less accurate that those used for research, where they can be applied under ideal controlled circumstances with minimal exposure to variables. In reality, the score is likely to be a combination of pain intensity and pain affect, where pain affect describes the degree of emotional arousal or changes in action readiness caused by the sensory experience of pain. Factors such as social and work situation, setting, emotional state, anxiety levels, expectation, and prior pain experience may influence pain perception and show large inter-individual differences that may also be affected by time variables.  Patient instructions provided during this appointment: Patient Instructions   ____________________________________________________________________________________________  Medication Rules  Applies to: All patients receiving prescriptions (written or electronic).  Pharmacy of record: Pharmacy where electronic prescriptions will be sent. If written prescriptions are taken to a different pharmacy, please inform the nursing staff. The pharmacy listed in the electronic medical record should be the one where you would like electronic prescriptions to be sent.  Prescription refills: Only during scheduled appointments. Applies to both, written and electronic  prescriptions.  NOTE: The following applies primarily to controlled substances (Opioid* Pain Medications).   Patient's responsibilities: 1. Pain Pills: Bring all pain pills to every appointment (except for procedure appointments). 2. Pill Bottles: Bring pills in original pharmacy bottle. Always bring newest bottle. Bring bottle, even if empty. 3. Medication refills: You are responsible for knowing and keeping track of what medications  you need refilled. The day before your appointment, write a list of all prescriptions that need to be refilled. Bring that list to your appointment and give it to the admitting nurse. Prescriptions will be written only during appointments. If you forget a medication, it will not be "Called in", "Faxed", or "electronically sent". You will need to get another appointment to get these prescribed. 4. Prescription Accuracy: You are responsible for carefully inspecting your prescriptions before leaving our office. Have the discharge nurse carefully go over each prescription with you, before taking them home. Make sure that your name is accurately spelled, that your address is correct. Check the name and dose of your medication to make sure it is accurate. Check the number of pills, and the written instructions to make sure they are clear and accurate. Make sure that you are given enough medication to last until your next medication refill appointment. 5. Taking Medication: Take medication as prescribed. Never take more pills than instructed. Never take medication more frequently than prescribed. Taking less pills or less frequently is permitted and encouraged, when it comes to controlled substances (written prescriptions).  6. Inform other Doctors: Always inform, all of your healthcare providers, of all the medications you take. 7. Pain Medication from other Providers: You are not allowed to accept any additional pain medication from any other Doctor or Healthcare provider. There  are two exceptions to this rule. (see below) In the event that you require additional pain medication, you are responsible for notifying us, as stated below. 8. Medication Agreement: You are responsible for carefully reading and following our Medication Agreement. This must be signed before receiving any prescriptions from our practice. Safely store a copy of your signed Agreement. Violations to the Agreement will result in no further prescriptions. (Additional copies of our Medication Agreement are available upon request.) 9. Laws, Rules, & Regulations: All patients are expected to follow all Federal and [employer], [employer], Rules, [employer]. Ignorance of the Laws does not constitute a valid excuse. The use of any illegal substances is prohibited. 10. Adopted CDC guidelines & recommendations: Target dosing levels will be at or below 60 MME/day. Use of benzodiazepines** is not recommended.  Exceptions: There are only two exceptions to the rule of not receiving pain medications from other Healthcare Providers. 1. Exception #1 (Emergencies): In the event of an emergency (i.e.: accident requiring emergency care), you are allowed to receive additional pain medication. However, you are responsible for: As soon as you are able, call our office [phone], at any time of the day or night, and leave a message stating your name, the date and nature of the emergency, and the name and dose of the medication prescribed. In the event that your call is answered by a member of our staff, make sure to document and save the date, time, and the name of the person that took your information.  2. Exception #2 (Planned Surgery): In the event that you are scheduled by another doctor or dentist to have any type of surgery or procedure, you are allowed (for a period no longer than 30 days), to receive additional pain medication, for the acute post-op pain. However, in this case, you are responsible for picking up a copy of  our "Post-op Pain Management for Surgeons" handout, and giving it to your surgeon or dentist. This document is available at our office, and does not require an appointment to obtain it. Simply go to our office during business hours (Monday-Thursday from 8:00  AM to 4:00 PM) (Friday 8:00 AM to 12:00 Noon) or if you have a scheduled appointment with [place], prior to your surgery, and ask for it by name. In addition, you will need to provide [place] with your name, name of your surgeon, type of surgery, and date of procedure or surgery.  *Opioid medications include: morphine, codeine, oxycodone, oxymorphone, hydrocodone, hydromorphone, meperidine, tramadol, tapentadol, buprenorphine, fentanyl, methadone. **Benzodiazepine medications include: diazepam (Valium), alprazolam (Xanax), clonazepam (Klonopine), lorazepam (Ativan), clorazepate (Tranxene), chlordiazepoxide (Librium), estazolam (Prosom), oxazepam (Serax), temazepam (Restoril), triazolam (Halcion)  ____________________________________________________________________________________________ Pain Management Discharge Instructions  General Discharge Instructions :  If you need to reach your doctor call: Monday-Friday 8:00 am - 4:00 pm at [phone] or toll free [phone].  After clinic hours [phone] to have operator reach doctor.  Bring all of your medication bottles to all your appointments in the pain clinic.  To cancel or reschedule your appointment with Pain Management please remember to call 24 hours in advance to avoid a fee.  Refer to the educational materials which you have been given on: General Risks, I had my Procedure. Discharge Instructions, Post Sedation.  Post Procedure Instructions:  The drugs you were given will stay in your system until tomorrow, so for the next 24 hours you should not drive, make any legal decisions or drink any alcoholic beverages.  You may eat anything you prefer, but it is better to start with liquids  then soups and crackers, and gradually work up to solid foods.  Please notify your doctor immediately if you have any unusual bleeding, trouble breathing or pain that is not related to your normal pain.  Depending on the type of procedure that was done, some parts of your body may feel week and/or numb.  This usually clears up by tonight or the next day.  Walk with the use of an assistive device or accompanied by an adult for the 24 hours.  You may use ice on the affected area for the first 24 hours.  Put ice in a Ziploc bag and cover with a towel and place against area 15 minutes on 15 minutes off.  You may switch to heat after 24 hours.

## 2017-09-10 NOTE — Progress Notes (Signed)
Nursing Pain Medication Assessment:  Safety precautions to be maintained throughout the outpatient stay will include: orient to surroundings, keep bed in low position, maintain call bell within reach at all times, provide assistance with transfer out of bed and ambulation.  Medication Inspection Compliance: Pill count conducted under aseptic conditions, in front of the patient. Neither the pills nor the bottle was removed from the patient's sight at any time. Once count was completed pills were immediately returned to the patient in their original bottle.  Medication: Oxycodone IR Pill/Patch Count: 0 of 120 pills remain Pill/Patch Appearance: no pills available to verify Bottle Appearance: Standard pharmacy container. Clearly labeled. Filled Date: 86 / 06 / 2018 Last Medication intake:  Today

## 2017-09-10 NOTE — Telephone Encounter (Signed)
Please advise 

## 2017-09-10 NOTE — Telephone Encounter (Signed)
Copied from [place] #[id number]. Topic: Quick Communication - See Telephone Encounter >> Sep 10, 2017  1:58 PM Burnis Medin, NT wrote: CRM for notification. See Telephone encounter for: Pt. Called in and said she wanted to speak to her doctor about coming off her medication. Pt. Had a lung scan and she didn't have and plaque. PT. Wants a call back.  09/10/17.

## 2017-09-10 NOTE — Patient Instructions (Addendum)
____________________________________________________________________________________________  Medication Rules  Applies to: All patients receiving prescriptions (written or electronic).  Pharmacy of record: Pharmacy where electronic prescriptions will be sent. If written prescriptions are taken to a different pharmacy, please inform the nursing staff. The pharmacy listed in the electronic medical record should be the one where you would like electronic prescriptions to be sent.  Prescription refills: Only during scheduled appointments. Applies to both, written and electronic prescriptions.  NOTE: The following applies primarily to controlled substances (Opioid* Pain Medications).   Patient's responsibilities: 1. Pain Pills: Bring all pain pills to every appointment (except for procedure appointments). 2. Pill Bottles: Bring pills in original pharmacy bottle. Always bring newest bottle. Bring bottle, even if empty. 3. Medication refills: You are responsible for knowing and keeping track of what medications you need refilled. The day before your appointment, write a list of all prescriptions that need to be refilled. Bring that list to your appointment and give it to the admitting nurse. Prescriptions will be written only during appointments. If you forget a medication, it will not be "Called in", "Faxed", or "electronically sent". You will need to get another appointment to get these prescribed. 4. Prescription Accuracy: You are responsible for carefully inspecting your prescriptions before leaving our office. Have the discharge nurse carefully go over each prescription with you, before taking them home. Make sure that your name is accurately spelled, that your address is correct. Check the name and dose of your medication to make sure it is accurate. Check the number of pills, and the written instructions to make sure they are clear and accurate. Make sure that you are given enough medication to  last until your next medication refill appointment. 5. Taking Medication: Take medication as prescribed. Never take more pills than instructed. Never take medication more frequently than prescribed. Taking less pills or less frequently is permitted and encouraged, when it comes to controlled substances (written prescriptions).  6. Inform other Doctors: Always inform, all of your healthcare providers, of all the medications you take. 7. Pain Medication from other Providers: You are not allowed to accept any additional pain medication from any other Doctor or Healthcare provider. There are two exceptions to this rule. (see below) In the event that you require additional pain medication, you are responsible for notifying us, as stated below. 8. Medication Agreement: You are responsible for carefully reading and following our Medication Agreement. This must be signed before receiving any prescriptions from our practice. Safely store a copy of your signed Agreement. Violations to the Agreement will result in no further prescriptions. (Additional copies of our Medication Agreement are available upon request.) 9. Laws, Rules, & Regulations: All patients are expected to follow all Federal and State Laws, Statutes, Rules, & Regulations. Ignorance of the Laws does not constitute a valid excuse. The use of any illegal substances is prohibited. 10. Adopted CDC guidelines & recommendations: Target dosing levels will be at or below 60 MME/day. Use of benzodiazepines** is not recommended.  Exceptions: There are only two exceptions to the rule of not receiving pain medications from other Healthcare Providers. 1. Exception #1 (Emergencies): In the event of an emergency (i.e.: accident requiring emergency care), you are allowed to receive additional pain medication. However, you are responsible for: As soon as you are able, call our office [phone], at any time of the day or night, and leave a message stating your  name, the date and nature of the emergency, and the name and dose of the medication   prescribed. In the event that your call is answered by a member of our staff, make sure to document and save the date, time, and the name of the person that took your information.  2. Exception #2 (Planned Surgery): In the event that you are scheduled by another doctor or dentist to have any type of surgery or procedure, you are allowed (for a period no longer than 30 days), to receive additional pain medication, for the acute post-op pain. However, in this case, you are responsible for picking up a copy of our "Post-op Pain Management for Surgeons" handout, and giving it to your surgeon or dentist. This document is available at our office, and does not require an appointment to obtain it. Simply go to our office during business hours (Monday-Thursday from 8:00 AM to 4:00 PM) (Friday 8:00 AM to 12:00 Noon) or if you have a scheduled appointment with us, prior to your surgery, and ask for it by name. In addition, you will need to provide us with your name, name of your surgeon, type of surgery, and date of procedure or surgery.  *Opioid medications include: morphine, codeine, oxycodone, oxymorphone, hydrocodone, hydromorphone, meperidine, tramadol, tapentadol, buprenorphine, fentanyl, methadone. **Benzodiazepine medications include: diazepam (Valium), alprazolam (Xanax), clonazepam (Klonopine), lorazepam (Ativan), clorazepate (Tranxene), chlordiazepoxide (Librium), estazolam (Prosom), oxazepam (Serax), temazepam (Restoril), triazolam (Halcion)  ____________________________________________________________________________________________ Pain Management Discharge Instructions  General Discharge Instructions :  If you need to reach your doctor call: Monday-Friday 8:00 am - 4:00 pm at [phone] or toll free [phone].  After clinic hours [phone] to have operator reach doctor.  Bring all of your medication bottles  to all your appointments in the pain clinic.  To cancel or reschedule your appointment with Pain Management please remember to call 24 hours in advance to avoid a fee.  Refer to the educational materials which you have been given on: General Risks, I had my Procedure. Discharge Instructions, Post Sedation.  Post Procedure Instructions:  The drugs you were given will stay in your system until tomorrow, so for the next 24 hours you should not drive, make any legal decisions or drink any alcoholic beverages.  You may eat anything you prefer, but it is better to start with liquids then soups and crackers, and gradually work up to solid foods.  Please notify your doctor immediately if you have any unusual bleeding, trouble breathing or pain that is not related to your normal pain.  Depending on the type of procedure that was done, some parts of your body may feel week and/or numb.  This usually clears up by tonight or the next day.  Walk with the use of an assistive device or accompanied by an adult for the 24 hours.  You may use ice on the affected area for the first 24 hours.  Put ice in a Ziploc bag and cover with a towel and place against area 15 minutes on 15 minutes off.  You may switch to heat after 24 hours. 

## 2017-09-11 NOTE — Telephone Encounter (Signed)
Patient states she would like to come off the simvastatin, she states she had lung scan which did not show plague and if she does not need to take this then she would like to come off of it.

## 2017-09-11 NOTE — Telephone Encounter (Signed)
Order placed

## 2017-09-11 NOTE — Telephone Encounter (Signed)
The 10-year ASCVD risk score Mikey Bussing DC Brooke Bonito., et al., 2013) is: 0.9%   Values used to calculate the score:     Age: 56 years     Sex: Female     Is Non-Hispanic African American: No     Diabetic: No     Tobacco smoker: No     Systolic Blood Pressure: 112 mmHg     Is BP treated: No     HDL Cholesterol: 61.8 mg/dL     Total Cholesterol: 139 mg/dL  Patient can come off the simvastatin. We should recheck in 3 months. Thanks.

## 2017-09-11 NOTE — Telephone Encounter (Signed)
Patient notified and will come off the simvastatin. Patient states you wanted her to have a CT scan for the pain near the belly button and she has reached her out of pocket max and would like this done before the beginning of the year

## 2017-09-11 NOTE — Telephone Encounter (Signed)
Patient notified

## 2017-09-21 ENCOUNTER — Telehealth: Payer: Self-pay | Admitting: Family Medicine

## 2017-09-21 NOTE — Telephone Encounter (Signed)
Copied from [place] [phone]. Topic: Inquiry >> Sep 21, 2017  2:53 PM Nicole Walter, NT wrote: Reason for CRM: Patient was told an appointment would be made with a OBGYN, and she would be scheduled to have a uterine ct scan t and she is calling to follow up , and would like a return call

## 2017-09-21 NOTE — Telephone Encounter (Signed)
Copied from [place] [phone]. Topic: Inquiry >> Sep 21, 2017  2:53 PM Nicole Walter, NT wrote: Reason for CRM: Patient was told an appointment would be made with a OBGYN, and she would be scheduled to have a uterine ct scan t and she is calling to follow up , and would like a return call

## 2017-09-21 NOTE — Telephone Encounter (Signed)
Copied from [place] [phone]. Topic: Inquiry >> Sep 21, 2017  2:53 PM Cecelia Byars, NT wrote: Reason for CRM: Patient was told an appointment would be made with a OBGYN, and she would be scheduled to have a uterine ct scan t and she is calling to follow up , and would like a return call

## 2017-09-21 NOTE — Telephone Encounter (Signed)
Copied from [place] [phone]. Topic: Inquiry >> Sep 21, 2017  2:53 PM Cecelia Byars, NT wrote: Reason for CRM: Patient was told an appointment would be made with a OBGYN, and she would be scheduled to have a uterine ct scan t and she is calling to follow up , and would like a return call

## 2017-09-21 NOTE — Telephone Encounter (Signed)
Please advise 

## 2017-09-21 NOTE — Telephone Encounter (Signed)
CT scan was denied by insurance. Waiting on Dr. Caryl Bis to let me know how to proceed.  OBGYN referral has been placed and sent through [facility]que to [facility]. They will follow up with her

## 2017-09-21 NOTE — Telephone Encounter (Deleted)
Copied from [place] [phone]. Topic: Inquiry >> Sep 21, 2017  2:53 PM Cecelia Byars, NT wrote: Reason for CRM: Patient was told an appointment would be made with a OBGYN, and she would be scheduled to have a uterine ct scan t and she is calling to follow up , and would like a return call

## 2017-09-21 NOTE — Telephone Encounter (Signed)
Copied from [place] [phone]. Topic: Inquiry >> Sep 21, 2017  2:53 PM Cecelia Byars, NT wrote: Reason for CRM: Patient was told an appointment would be made with a OBGYN, and she would be scheduled to have a uterine ct scan t and she is calling to follow up , and would like a return call

## 2017-09-21 NOTE — Telephone Encounter (Signed)
Copied from [place] [phone]. Topic: Inquiry >> Sep 21, 2017  2:53 PM Cecelia Byars, NT wrote: Reason for CRM: Patient was told an appointment would be made with a OBGYN, and she would be scheduled to have a uterine ct scan t and she is calling to follow up , and would like a return call

## 2017-09-21 NOTE — Telephone Encounter (Signed)
Please advise on referral

## 2017-09-22 ENCOUNTER — Other Ambulatory Visit: Payer: Self-pay | Admitting: Family Medicine

## 2017-09-24 ENCOUNTER — Other Ambulatory Visit: Payer: Self-pay | Admitting: Family Medicine

## 2017-09-24 DIAGNOSIS — Z1231 Encounter for screening mammogram for malignant neoplasm of breast: Secondary | ICD-10-CM

## 2017-09-24 NOTE — Telephone Encounter (Signed)
Ct was declined-letter from insurance in Dr. Ellen Henri box. Waiting for his return to let me know how to proceed. Pt aware

## 2017-10-05 ENCOUNTER — Telehealth: Payer: Self-pay | Admitting: *Deleted

## 2017-10-05 NOTE — Telephone Encounter (Signed)
I will forward to [facility]. Orders were previously placed. Thanks.

## 2017-10-05 NOTE — Telephone Encounter (Signed)
I spoke to her last week regarding these two referrals. Her GYN was sent to [facility]. I offered to give her the phone number to schedule, but said that she would wait until they called her even though I informed her that they were behind on their incoming referrals. I will follow up with her and give her the number to call to go ahead and get this scheduled. Her insurance would not authorize her CT and I told her that I had to wait till Dr. Caryl Bis was back in office to see how he would like to proceed.

## 2017-10-05 NOTE — Telephone Encounter (Signed)
Copied from [place] [phone]. Topic: Referral - Status >> Oct 05, 2017 10:39 AM Synthia Innocent wrote: Reason for CRM: Checking on status of Gyn referral and CT scan. States has been over a month and has not heard anything

## 2017-10-06 NOTE — Telephone Encounter (Signed)
Noted. Thanks for contacting her. It looks like there was a denial letter. Where was this placed?

## 2017-10-08 NOTE — Telephone Encounter (Signed)
Perfect. Thanks for taking care of this.

## 2017-10-08 NOTE — Telephone Encounter (Signed)
It had expired prior to you returning from vacation. I attempted to get authorization again this morning, it was approved. She has been scheduled. LVM for pt to rtc.

## 2017-10-11 ENCOUNTER — Other Ambulatory Visit: Payer: Self-pay

## 2017-10-11 ENCOUNTER — Telehealth: Payer: Self-pay | Admitting: Family Medicine

## 2017-10-11 MED ORDER — SAXENDA 18 MG/3ML ~~LOC~~ SOPN: 3.0000 mg | PEN_INJECTOR | Freq: Every day | SUBCUTANEOUS | 0 refills | Status: DC

## 2017-10-11 NOTE — Telephone Encounter (Signed)
PA for saxsenda completed on cover my meds.Ericia Riner (Key: [id number])

## 2017-10-11 NOTE — Telephone Encounter (Signed)
Last OV 08/24/17 last filled 09/24/17 79ml 1rf

## 2017-10-11 NOTE — Telephone Encounter (Signed)
Copied from [place] [phone]. Topic: Quick Communication - Rx Refill/Question >> Oct 11, 2017  9:33 AM Arletha Grippe wrote: Has the patient contacted their pharmacy? Yes.     (Agent: If no, request that the patient contact the pharmacy for the refill.)   Preferred Pharmacy (with phone number or street name): pharmacy told pt that she had to have prior auth on SAXENDA 18 MG/3ML SOPN  and she needs refill on lubiprostone (AMITIZA) 24 MCG capsule. [facility] uses [facility] on [address] cb number is [phone], pt only has enough saxenda for today only    Agent: Please be advised that RX refills may take up to 3 business days. We ask that you follow-up with your pharmacy.

## 2017-10-12 ENCOUNTER — Ambulatory Visit
Admission: RE | Admit: 2017-10-12 | Discharge: 2017-10-12 | Disposition: A | Discharge status: Home / Self Care | Payer: BLUE CROSS/BLUE SHIELD | Source: Ambulatory Visit | Attending: Family Medicine | Admitting: Family Medicine

## 2017-10-12 DIAGNOSIS — Z9049 Acquired absence of other specified parts of digestive tract: Secondary | ICD-10-CM | POA: Insufficient documentation

## 2017-10-12 DIAGNOSIS — K760 Fatty (change of) liver, not elsewhere classified: Secondary | ICD-10-CM | POA: Insufficient documentation

## 2017-10-12 DIAGNOSIS — K5790 Diverticulosis of intestine, part unspecified, without perforation or abscess without bleeding: Secondary | ICD-10-CM | POA: Insufficient documentation

## 2017-10-12 DIAGNOSIS — R109 Unspecified abdominal pain: Secondary | ICD-10-CM | POA: Diagnosis present

## 2017-10-12 MED ORDER — IOPAMIDOL (ISOVUE-300) INJECTION 61%
100.0000 mL | Freq: Once | INTRAVENOUS | Status: AC | PRN
  Administered 2017-10-12: 100 mL via INTRAVENOUS

## 2017-10-17 ENCOUNTER — Other Ambulatory Visit: Payer: Self-pay | Admitting: Sports Medicine

## 2017-10-17 DIAGNOSIS — M25511 Pain in right shoulder: Principal | ICD-10-CM

## 2017-10-17 DIAGNOSIS — G8929 Other chronic pain: Secondary | ICD-10-CM

## 2017-10-18 ENCOUNTER — Ambulatory Visit
Admission: RE | Admit: 2017-10-18 | Discharge: 2017-10-18 | Disposition: A | Discharge status: Home / Self Care | Payer: BLUE CROSS/BLUE SHIELD | Source: Ambulatory Visit | Attending: Family Medicine | Admitting: Family Medicine

## 2017-10-18 ENCOUNTER — Ambulatory Visit (INDEPENDENT_AMBULATORY_CARE_PROVIDER_SITE_OTHER): Payer: BLUE CROSS/BLUE SHIELD | Admitting: Obstetrics and Gynecology

## 2017-10-18 ENCOUNTER — Encounter: Payer: Self-pay | Admitting: Obstetrics and Gynecology

## 2017-10-18 VITALS — BP 93/59 | HR 73 | Ht 64.0 in | Wt 146.4 lb

## 2017-10-18 DIAGNOSIS — Z1231 Encounter for screening mammogram for malignant neoplasm of breast: Secondary | ICD-10-CM | POA: Diagnosis present

## 2017-10-18 DIAGNOSIS — Z0000 Encounter for general adult medical examination without abnormal findings: Secondary | ICD-10-CM

## 2017-10-18 NOTE — Progress Notes (Signed)
HPI:      [name] is a 56 y.o. [id number] who LMP was No LMP recorded. Patient is postmenopausal.  Subjective:   She presents today for her annual examination.  She reports that she is in menopause and is having no vaginal bleeding.  She has recently begun having upper midline abdominal pain.  She states that her visit today is for breast exam and pelvic exam with Pap smear. (She does report a distant history of HPV but reports that her last Pap smear and the previous colposcopy were "normal") She recently underwent a CT and ultrasound to try to determine the source of her upper abdominal pain.  She would like to discuss these today as well. She is not taking HRT.    Hx: The following portions of the patient's history were reviewed and updated as appropriate:             She  has a past medical history of Abnormal liver enzymes (02/08/2016), Allergy, Anxiety, Chest pain (07/05/2016), Chronic pain, Depression, DJD (degenerative joint disease), Fibromyalgia, High cholesterol, Rash of back (01/28/2016), and Weight loss due to medication. She does not have any pertinent problems on file. She  has a past surgical history that includes Cholecystectomy; Tubal ligation; Ankle surgery; Nasal sinus surgery; Tonsillectomy; Colonoscopy; Esophagogastroduodenoscopy (egd) with propofol (N/A, 02/22/2016); Colonoscopy with propofol (N/A, 02/22/2016); and Breast biopsy (Left, 1997). Her family history includes Arthritis in her father, paternal grandfather, and paternal grandmother; Cancer in her brother and father; Dementia in her sister; Diabetes in her sister; Drug abuse in her brother, brother, and sister; Heart disease in her mother; Post-traumatic stress disorder in her brother; Stroke in her mother. She  reports that she quit smoking about 2 years ago. She started smoking about 43 years ago. She has a 35.00 pack-year smoking history. she has never used smokeless tobacco. She reports that she does not drink  alcohol or use drugs. She is allergic to other and food.       Review of Systems:  Review of Systems  Constitutional: Denied constitutional symptoms, night sweats, recent illness, fatigue, fever, insomnia and weight loss.  Eyes: Denied eye symptoms, eye pain, photophobia, vision change and visual disturbance.  Ears/Nose/Throat/Neck: Denied ear, nose, throat or neck symptoms, hearing loss, nasal discharge, sinus congestion and sore throat.  Cardiovascular: Denied cardiovascular symptoms, arrhythmia, chest pain/pressure, edema, exercise intolerance, orthopnea and palpitations.  Respiratory: Denied pulmonary symptoms, asthma, pleuritic pain, productive sputum, cough, dyspnea and wheezing.  Gastrointestinal: Denied, gastro-esophageal reflux, melena, nausea and vomiting.  Complains of upper midline abdominal pain and intermittent "tightening".  Genitourinary:  Denied genitourinary symptoms including symptomatic vaginal discharge, pelvic relaxation issues, and urinary complaints.  Musculoskeletal: Denied musculoskeletal symptoms, stiffness, swelling, muscle weakness and myalgia.  Dermatologic: Denied dermatology symptoms, rash and scar.  Neurologic: Denied neurology symptoms, dizziness, headache, neck pain and syncope.  Psychiatric: Denied psychiatric symptoms, anxiety and depression.  Endocrine: Denied endocrine symptoms including hot flashes and night sweats.   Meds:   Current Outpatient Medications on File Prior to Visit  Medication Sig Dispense Refill  . BD ULTRA-FINE MICRO PEN NEEDLE 32G X 6 MM MISC use once daily 100 each 0  . Calcium Carbonate-Vit D-Min (CALCIUM 1200 PO) Take by mouth.    . Cholecalciferol (VITAMIN D) 2000 units tablet Take 4,000 Units by mouth daily.    . fluocinonide ointment (LIDEX) 0.05 % APPLY TOPICALLY TWICE A DAY TO ITCHY AREAS ONLY. as needed  0  . fluticasone (FLONASE) 50  MCG/ACT nasal spray instill 2 sprays into each nostril once daily 16 g 11  . hydrOXYzine  (VISTARIL) 25 MG capsule Take by mouth.    . lamoTRIgine (LAMICTAL) 25 MG tablet Take 50 mg by mouth daily.    Marland Kitchen lubiprostone (AMITIZA) 24 MCG capsule Take 1 capsule (24 mcg total) 2 (two) times daily with a meal by mouth. 60 capsule 0  . Multiple Vitamin (MULTI-VITAMINS) TABS Take 1 tablet by mouth daily.     Derrill Memo ON 12/09/2017] oxyCODONE (OXY IR/ROXICODONE) 5 MG immediate release tablet Take 1 tablet (5 mg total) every 6 (six) hours as needed by mouth for severe pain. 120 tablet 0  . [START ON 11/09/2017] oxyCODONE (OXY IR/ROXICODONE) 5 MG immediate release tablet Take 1 tablet (5 mg total) every 6 (six) hours as needed by mouth for severe pain. 120 tablet 0  . oxyCODONE (OXY IR/ROXICODONE) 5 MG immediate release tablet Take 1 tablet (5 mg total) every 6 (six) hours as needed by mouth for severe pain. 120 tablet 0  . RABEprazole (ACIPHEX) 20 MG tablet Take 20 mg by mouth as needed.     Marland Kitchen SAXENDA 18 MG/3ML SOPN Inject 3 mg into the skin daily. 15 mL 0  . sucralfate (CARAFATE) 1 g tablet take 1 tablet by mouth three times a day if needed for abdominal pain  0  . TRINTELLIX 20 MG TABS   0   No current facility-administered medications on file prior to visit.     Objective:     Vitals:   10/18/17 1102  BP: (!) 93/59  Pulse: 73              Physical examination Breasts: No masses or discharge.  Symmetric.  No axillary adenopathy.  Abdomen: Soft.  Non-tender.  No masses.  No HSM. No hernia  Extremities: Moves all appropriately.  Normal ROM for age. No lymphadenopathy.  Neuro: Oriented to PPT.  Normal mood. Normal affect.     Pelvic:   Vulva: Normal appearance.  No lesions.  Vagina: No lesions or abnormalities noted.  Support: Normal pelvic support.  Urethra No masses tenderness or scarring.  Meatus Normal size without lesions or prolapse.  Cervix: Normal appearance.  No lesions.  Anus: Normal exam.  No lesions.  Perineum: Normal exam.  No lesions.        Bimanual   Uterus: Normal  size.  Non-tender.  Mobile.  AV.  Adnexae: No masses.  Non-tender to palpation.  Cul-de-sac: Negative for abnormality.   I specifically reviewed her CT scan and ultrasound with her.  We discussed all of the minor abnormalities noted.   Assessment:    [id number] Patient Active Problem List   Diagnosis Date Noted  . Chronic pelvic pain in female 08/24/2017  . Fatty liver 08/24/2017  . Chronic low back pain (Bilateral) (L>R) 08/13/2017  . Cervical Foraminal Stenosis (Severe) (Bilateral: C-5-6) 08/13/2017  . Cervical spondylosis with radiculopathy (Bilateral) (L>R) 08/13/2017  . Cervical sensory radiculopathy at C5 (Bilateral) 08/13/2017  . Radicular pain of shoulder (Bilateral) (C5) 08/13/2017  . Constipation 07/16/2017  . Myofascial pain syndrome, cervical (trapezius) (Left) 05/28/2017  . Cervical facet syndrome (Bilateral) (L>R) 05/28/2017  . Muscle spasm 05/15/2017  . Chronic right hip pain 03/19/2017  . Chronic knee arthropathy (Right) 03/14/2017  . Osteoarthritis of knee (Right) 02/21/2017  . Osteoarthritis of shoulder (Right) 01/31/2017  . Shortness of breath 01/15/2017  . Thoracic spondylosis 01/01/2017  . Thoracic radiculitis (R>L) 12/21/2016  . Osteoarthritis of  hip (Bilateral) (L>R) 11/20/2016  . Greater trochanteric bursitis (Right) 09/18/2016  . Subacromial bursitis of shoulder joint (Right) 09/18/2016  . Overweight (BMI 25.0-29.9) 09/14/2016  . Elevated sedimentation rate 08/31/2016  . Elevated C-reactive protein (CRP) 08/31/2016  . Long term current use of opiate analgesic 07/20/2016  . Long term prescription opiate use 07/20/2016  . Opiate use 07/20/2016  . Chronic upper back pain (Secondary source of pain) (Bilateral) (L>R) 07/20/2016  . Chronic shoulder pain [facility] source of pain) (Bilateral) (L>R) 07/20/2016  . Chronic knee pain (Right) 07/20/2016  . Chronic upper extremity pain (Left) 07/20/2016  . Chronic lower extremity pain (Bilateral) (L>R) 07/20/2016   . Chronic abdominal pain 07/20/2016  . Chronic hip pain (Bilateral) (L>R) 05/31/2016  . Chronic Greater trochanteric bursitis (Bilateral) (L>R) 05/31/2016  . Bursitis of right shoulder 05/17/2016  . Skin lesions 04/27/2016  . Hyperlipidemia 02/18/2016  . Cervical spondylosis (Bilateral) 02/08/2016  . Fibromyalgia 02/08/2016  . Osteopenia 02/08/2016  . Thoracic back pain 01/28/2016  . Chronic neck pain (Primary Source of Pain) (Bilateral) (L>R) 01/28/2016  . Concentration deficit 01/28/2016  . Lumbar spondylosis 01/06/2016  . Anxiety and depression 07/11/2015  . Chronic pain syndrome 07/11/2015  . Combined fat and carbohydrate induced hyperlipemia 07/11/2015     1. Encounter for annual physical exam        Plan:            1.  Pap performed-mammogram later today already scheduled.  We will contact her with any abnormal results from the Pap and necessary follow-up PRN  2.  We have discussed the natural course and history of HPV and its relationship to Pap smears.   3.  She says that she will continue her workup for upper abdominal pain at her [facility] office.  4.  I have reassured her that her pelvic ultrasound is normal and that no further workup is required at this time.  Orders         F/U  Return in about 1 year (around 10/18/2018) for Annual Physical.  Finis Bud, M.D. 10/18/2017 11:46 AM

## 2017-10-18 NOTE — Addendum Note (Signed)
Addended by: Raliegh Ip on: 10/18/2017 04:11 PM   Modules accepted: Orders

## 2017-10-19 ENCOUNTER — Telehealth: Payer: Self-pay | Admitting: Family Medicine

## 2017-10-19 NOTE — Telephone Encounter (Signed)
Noted. We will plan on checking this at that time.

## 2017-10-19 NOTE — Telephone Encounter (Signed)
Patient notified

## 2017-10-19 NOTE — Telephone Encounter (Signed)
Please advise 

## 2017-10-19 NOTE — Telephone Encounter (Signed)
Pt calling to make sure that on 10/24/17 that she has a "complete blood panel." She called to request her yearly blood work at her next physical.

## 2017-10-23 LAB — PAP IG AND HPV HIGH-RISK
HPV, high-risk: NEGATIVE
PAP Smear Comment: 0

## 2017-10-24 ENCOUNTER — Encounter: Payer: Self-pay | Admitting: Family Medicine

## 2017-10-24 ENCOUNTER — Ambulatory Visit
Admission: RE | Admit: 2017-10-24 | Discharge: 2017-10-24 | Disposition: A | Discharge status: Home / Self Care | Payer: BLUE CROSS/BLUE SHIELD | Source: Ambulatory Visit | Attending: Sports Medicine | Admitting: Sports Medicine

## 2017-10-24 ENCOUNTER — Ambulatory Visit (INDEPENDENT_AMBULATORY_CARE_PROVIDER_SITE_OTHER): Payer: BLUE CROSS/BLUE SHIELD | Admitting: Family Medicine

## 2017-10-24 ENCOUNTER — Other Ambulatory Visit: Payer: Self-pay

## 2017-10-24 VITALS — BP 98/62 | HR 72 | Temp 97.8°F | Wt 145.8 lb

## 2017-10-24 DIAGNOSIS — Z130 Encounter for screening for diseases of the blood and blood-forming organs and certain disorders involving the immune mechanism: Secondary | ICD-10-CM | POA: Diagnosis not present

## 2017-10-24 DIAGNOSIS — R911 Solitary pulmonary nodule: Secondary | ICD-10-CM | POA: Diagnosis not present

## 2017-10-24 DIAGNOSIS — F419 Anxiety disorder, unspecified: Secondary | ICD-10-CM

## 2017-10-24 DIAGNOSIS — G8929 Other chronic pain: Secondary | ICD-10-CM | POA: Diagnosis not present

## 2017-10-24 DIAGNOSIS — IMO0001 Reserved for inherently not codable concepts without codable children: Secondary | ICD-10-CM

## 2017-10-24 DIAGNOSIS — Z0001 Encounter for general adult medical examination with abnormal findings: Secondary | ICD-10-CM

## 2017-10-24 DIAGNOSIS — M75101 Unspecified rotator cuff tear or rupture of right shoulder, not specified as traumatic: Secondary | ICD-10-CM | POA: Insufficient documentation

## 2017-10-24 DIAGNOSIS — M19011 Primary osteoarthritis, right shoulder: Secondary | ICD-10-CM | POA: Diagnosis not present

## 2017-10-24 DIAGNOSIS — F32A Depression, unspecified: Secondary | ICD-10-CM

## 2017-10-24 DIAGNOSIS — Z1329 Encounter for screening for other suspected endocrine disorder: Secondary | ICD-10-CM | POA: Diagnosis not present

## 2017-10-24 DIAGNOSIS — Z13220 Encounter for screening for lipoid disorders: Secondary | ICD-10-CM | POA: Diagnosis not present

## 2017-10-24 DIAGNOSIS — R7303 Prediabetes: Secondary | ICD-10-CM | POA: Diagnosis not present

## 2017-10-24 DIAGNOSIS — M25511 Pain in right shoulder: Secondary | ICD-10-CM | POA: Diagnosis present

## 2017-10-24 DIAGNOSIS — F329 Major depressive disorder, single episode, unspecified: Secondary | ICD-10-CM

## 2017-10-24 LAB — COMPREHENSIVE METABOLIC PANEL
ALK PHOS: 65 U/L (ref 39–117)
ALT: 11 U/L (ref 0–35)
AST: 14 U/L (ref 0–37)
Albumin: 4.2 g/dL (ref 3.5–5.2)
BUN: 12 mg/dL (ref 6–23)
CHLORIDE: 106 meq/L (ref 96–112)
CO2: 31 meq/L (ref 19–32)
Calcium: 9.1 mg/dL (ref 8.4–10.5)
Creatinine, Ser: 0.8 mg/dL (ref 0.40–1.20)
GFR: 79 mL/min (ref >60.00)
GLUCOSE: 94 mg/dL (ref 70–99)
POTASSIUM: 4.2 meq/L (ref 3.5–5.1)
SODIUM: 142 meq/L (ref 135–145)
TOTAL PROTEIN: 6.8 g/dL (ref 6.0–8.3)
Total Bilirubin: 0.6 mg/dL (ref 0.2–1.2)

## 2017-10-24 LAB — LIPID PANEL
CHOL/HDL RATIO: 4
CHOLESTEROL: 156 mg/dL (ref 0–200)
HDL: 44.3 mg/dL (ref >39.00)
LDL CALC: 92 mg/dL (ref 0–99)
NONHDL: 111.52
Triglycerides: 96 mg/dL (ref 0.0–149.0)
VLDL: 19.2 mg/dL (ref 0.0–40.0)

## 2017-10-24 LAB — CBC
HEMATOCRIT: 40.2 % (ref 36.0–46.0)
HEMOGLOBIN: 13.2 g/dL (ref 12.0–15.0)
MCHC: 32.9 g/dL (ref 30.0–36.0)
MCV: 89.2 fl (ref 78.0–100.0)
PLATELETS: 262 10*3/uL (ref 150.0–400.0)
RBC: 4.5 Mil/uL (ref 3.87–5.11)
RDW: 13.5 % (ref 11.5–15.5)
WBC: 5.6 10*3/uL (ref 4.0–10.5)

## 2017-10-24 LAB — HEMOGLOBIN A1C: HEMOGLOBIN A1C: 5.3 % (ref 4.6–6.5)

## 2017-10-24 LAB — TSH: TSH: 0.89 u[IU]/mL (ref 0.35–4.50)

## 2017-10-24 MED ORDER — LAMOTRIGINE 25 MG PO TABS: 50.0000 mg | ORAL_TABLET | Freq: Every day | ORAL | 3 refills | Status: DC

## 2017-10-24 MED ORDER — TRINTELLIX 10 MG PO TABS
10.0000 mg | ORAL_TABLET | Freq: Every day | ORAL | 1 refills | Status: DC
Start: 2017-10-24 — End: 2017-12-19

## 2017-10-24 NOTE — Assessment & Plan Note (Signed)
Did improve though worsened after she stopped her medications.  No longer seeing psychiatry.  She is currently back on trintellix and Lamictal.  I discussed the significant risk factors of being on Lamictal.  She is unsure if she wants to discontinue this.  She will monitor for fever and rash and if these things occur she will be evaluated immediately.  Refills were provided.  If she would like to taper off of Lamictal she will let us know.

## 2017-10-24 NOTE — Patient Instructions (Signed)
Nice to see you. We will check lab work today and contact you with the results. We will follow-up in about 2 months regarding your weight.  If you feel as though you want to discontinue the Saxenda you can stop this on your own.  Please let us know that you have done this. Please be careful with the Lamictal.  If you notice any rash or fevers you need to be evaluated immediately.  I have refilled this and your trintellix.  If you decide you want to come off of the Lamictal please let us know and we can taper you. We will get you set up for repeat CT scan chest.

## 2017-10-24 NOTE — Progress Notes (Signed)
Tommi Rumps, MD Phone: [phone]  Nicole Walter is a 56 y.o. female who presents today for physical exam.  Diet is very healthy. Exercising 4 days a week.  She has been on saxenda and is finally at her goal weight.  She wants to lose a few more pounds and then consider coming off this. Pap smear and breast exams through gynecology.  Mammogram completed and normal. Colonoscopy up-to-date.  Likely 5-year recall given tubular adenoma. Tetanus vaccination, hepatitis C testing, HIV testing, and flu vaccination up-to-date. Patient recently underwent CT scan of her chest for lung cancer screening.  They recommended 28-monthrecall.  She quit smoking in 2014 and has an 80-pack-year history. No alcohol use or illicit drug use.  She was seeing a psychiatrist for her depression.  They started her on Lamictal and increased the dose up to 200 mg.  They then took her off the trintellix.  She notes her symptoms worsened with this change.  She has not gone back to the psychiatrist and states that they advised that she could not come back given that she stopped her medications.  She is now back on trintellix 10 mg and Lamictal 50 mg.  She had lots going on around that time and her granddaughter is currently living with her.  Patient notes no SI or HI.  No significant symptoms at this time.  Active Ambulatory Problems    Diagnosis Date Noted  . Anxiety and depression 07/11/2015  . Chronic pain syndrome 07/11/2015  . Lumbar spondylosis 01/06/2016  . Combined fat and carbohydrate induced hyperlipemia 07/11/2015  . Encounter for general adult medical examination with abnormal findings 01/14/2016  . Thoracic back pain 01/28/2016  . Chronic neck pain (Primary Source of Pain) (Bilateral) (L>R) 01/28/2016  . Concentration deficit 01/28/2016  . Hyperlipidemia 02/18/2016  . Cervical spondylosis (Bilateral) 02/08/2016  . Fibromyalgia 02/08/2016  . Osteopenia 02/08/2016  . Skin lesions 04/27/2016  .  Chronic hip pain (Bilateral) (L>R) 05/31/2016  . Bursitis of right shoulder 05/17/2016  . Chronic Greater trochanteric bursitis (Bilateral) (L>R) 05/31/2016  . Long term current use of opiate analgesic 07/20/2016  . Long term prescription opiate use 07/20/2016  . Opiate use 07/20/2016  . Chronic upper back pain (Secondary source of pain) (Bilateral) (L>R) 07/20/2016  . [facility] (B[facility]) (L>R) 07/20/2016  . Chronic knee pain (Right) 07/20/2016  . Chronic upper extremity pain (Left) 07/20/2016  . Chronic lower extremity pain (Bilateral) (L>R) 07/20/2016  . Chronic abdominal pain 07/20/2016  . Elevated sedimentation rate 08/31/2016  . Elevated C-reactive protein (CRP) 08/31/2016  . Overweight (BMI 25.0-29.9) 09/14/2016  . Greater trochanteric bursitis (Right) 09/18/2016  . Subacromial bursitis of shoulder joint (Right) 09/18/2016  . Osteoarthritis of hip (Bilateral) (L>R) 11/20/2016  . Thoracic radiculitis (R>L) 12/21/2016  . Thoracic spondylosis 01/01/2017  . Shortness of breath 01/15/2017  . Osteoarthritis of shoulder (Right) 01/31/2017  . Osteoarthritis of knee (Right) 02/21/2017  . Chronic knee arthropathy (Right) 03/14/2017  . Chronic right hip pain 03/19/2017  . Muscle spasm 05/15/2017  . Myofascial pain syndrome, cervical (trapezius) (Left) 05/28/2017  . Cervical facet syndrome (Bilateral) (L>R) 05/28/2017  . Constipation 07/16/2017  . Chronic low back pain (Bilateral) (L>R) 08/13/2017  . Cervical Foraminal Stenosis (Severe) (Bilateral: C-5-6) 08/13/2017  . Cervical spondylosis with radiculopathy (Bilateral) (L>R) 08/13/2017  . Cervical sensory radiculopathy at C5 (Bilateral) 08/13/2017  . Radicular pain of shoulder (Bilateral) (C5) 08/13/2017  . Chronic pelvic pain in female 08/24/2017  .  Fatty liver 08/24/2017   Resolved Ambulatory Problems    Diagnosis Date Noted  . Generalized abdominal tenderness 01/14/2016  . Rash of back  01/28/2016  . Major depressive disorder, recurrent episode, moderate ([place]) 02/14/2016  . Abnormal liver enzymes 02/08/2016  . Chest pain 07/05/2016   Past Medical History:  Diagnosis Date  . Abnormal liver enzymes 02/08/2016  . Allergy   . Anxiety   . Chest pain 07/05/2016  . Chronic pain   . Depression   . DJD (degenerative joint disease)   . Fibromyalgia   . High cholesterol   . Rash of back 01/28/2016  . Weight loss due to medication     Family History  Problem Relation Age of Onset  . Heart disease Mother   . Stroke Mother   . Cancer Father        lung  . Arthritis Father   . Arthritis Paternal Grandmother   . Arthritis Paternal Grandfather   . Drug abuse Sister   . Drug abuse Brother   . Post-traumatic stress disorder Brother   . Diabetes Sister   . Dementia Sister   . Drug abuse Brother   . Cancer Brother     Social History   Socioeconomic History  . Marital status: Married    Spouse name: Not on file  . Number of children: Not on file  . Years of education: Not on file  . Highest education level: Not on file  Social Needs  . Financial resource strain: Not on file  . Food insecurity - worry: Not on file  . Food insecurity - inability: Not on file  . Transportation needs - medical: Not on file  . Transportation needs - non-medical: Not on file  Occupational History  . Not on file  Tobacco Use  . Smoking status: Former Smoker    Packs/day: 1.00    Years: 35.00    Pack years: 35.00    Start date: 03/06/1974    Last attempt to quit: 07/08/2015    Years since quitting: 2.2  . Smokeless tobacco: Never Used  . Tobacco comment: vapor cigarettes, no nicotene  Substance and Sexual Activity  . Alcohol use: No    Alcohol/week: 0.0 oz  . Drug use: No  . Sexual activity: Yes    Birth control/protection: None  Other Topics Concern  . Not on file  Social History Narrative  . Not on file    ROS  General:  Negative for nexplained weight loss, fever Skin:  Negative for new or changing mole, sore that won't heal HEENT: Negative for trouble hearing, trouble seeing, ringing in ears, mouth sores, hoarseness, change in voice, dysphagia. CV:  Negative for chest pain, dyspnea, edema, palpitations Resp: Negative for cough, dyspnea, hemoptysis GI: Negative for nausea, vomiting, diarrhea, constipation, abdominal pain, melena, hematochezia. GU: Negative for dysuria, incontinence, urinary hesitance, hematuria, vaginal or penile discharge, polyuria, sexual difficulty, lumps in testicle or breasts MSK: Negative for muscle cramps or aches, joint pain or swelling Neuro: Negative for headaches, weakness, numbness, dizziness, passing out/fainting Psych: Positive for depression, anxiety, negative for memory problems  Objective  Physical Exam Vitals:   10/24/17 0818  BP: 98/62  Pulse: 72  Temp: 97.8 F (36.6 C)  SpO2: 98%    BP Readings from Last 3 Encounters:  10/24/17 98/62  10/18/17 (!) 93/59  09/10/17 112/71   Wt Readings from Last 3 Encounters:  10/24/17 145 lb 12.8 oz (66.1 kg)  10/18/17 146 lb 7 oz (66.4  kg)  09/10/17 152 lb (68.9 kg)    Physical Exam  Constitutional: No distress.  HENT:  Head: Normocephalic and atraumatic.  Mouth/Throat: Oropharynx is clear and moist. No oropharyngeal exudate.  Eyes: Conjunctivae are normal. Pupils are equal, round, and reactive to light.  Neck: Neck supple.  Cardiovascular: Normal rate, regular rhythm and normal heart sounds.  Pulmonary/Chest: Effort normal and breath sounds normal.  Abdominal: Soft. Bowel sounds are normal. She exhibits no distension. There is no tenderness. There is no rebound and no guarding.  Musculoskeletal: She exhibits no edema.  Lymphadenopathy:    She has no cervical adenopathy.  Neurological: She is alert. Gait normal.  Skin: Skin is warm and dry. She is not diaphoretic.  Psychiatric: Mood and affect normal.     Assessment/Plan:   Encounter for general adult medical  examination with abnormal findings Physical exam completed.  Encouraged continued diet and exercise.  Consider coming off of saxenda after she loses a little bit more weight.  Pelvic exam and breast exam done through gynecology.  Health maintenance up-to-date.  CT chest ordered for 53-monthfollow-up.  Lab work as outlined below.  Anxiety and depression Did improve though worsened after she stopped her medications.  No longer seeing psychiatry.  She is currently back on trintellix and Lamictal.  I discussed the significant risk factors of being on Lamictal.  She is unsure if she wants to discontinue this.  She will monitor for fever and rash and if these things occur she will be evaluated immediately.  Refills were provided.  If she would like to taper off of Lamictal she will let uKoreaknow.   Orders Placed This Encounter  Procedures  . CT CHEST LCS NODULE F/U W/O CONTRAST    Standing Status:   Future    Standing Expiration Date:   12/25/2018    Order Specific Question:   Reason for Exam (SYMPTOM  OR DIAGNOSIS REQUIRED)    Answer:   prior lung nodule, 6 month follow-up    Order Specific Question:   Preferred Imaging Location?    Answer:   [facility]    Order Specific Question:   Is patient pregnant?    Answer:   No    Order Specific Question:   Preferred imaging location?    Answer:   [facility]    Order Specific Question:   Radiology Contrast Protocol - do NOT remove file path    Answer:   [email or web address]  . Lipid panel  . HgB A1c  . Comp Met (CMET)  . CBC  . TSH    Meds ordered this encounter  Medications  . lamoTRIgine (LAMICTAL) 25 MG tablet    Sig: Take 2 tablets (50 mg total) by mouth daily.    Dispense:  60 tablet    Refill:  3  . TRINTELLIX 10 MG TABS    Sig: Take 1 tablet (10 mg total) by mouth daily.    Dispense:  90 tablet    Refill:  1     ETommi Rumps MD [place]

## 2017-10-24 NOTE — Assessment & Plan Note (Signed)
Physical exam completed.  Encouraged continued diet and exercise.  Consider coming off of saxenda after she loses a little bit more weight.  Pelvic exam and breast exam done through gynecology.  Health maintenance up-to-date.  CT chest ordered for 82-month follow-up.  Lab work as outlined below.

## 2017-11-01 ENCOUNTER — Telehealth: Payer: Self-pay | Admitting: Family Medicine

## 2017-11-01 NOTE — Telephone Encounter (Signed)
Please advise 

## 2017-11-01 NOTE — Telephone Encounter (Unsigned)
Copied from [place] [phone]. Topic: Inquiry >> Nov 01, 2017  2:31 PM Neva Seat wrote: Pt is needing to know the status of her order of her CT scan of her lungs.  This has been approved by her insurance - [employer] order #[id number]. Pt needs to have this done before Jan 1st.

## 2017-11-14 ENCOUNTER — Telehealth: Payer: Self-pay | Admitting: Family Medicine

## 2017-11-14 NOTE — Telephone Encounter (Signed)
Copied from [place] [phone]. Topic: General - Other >> Nov 14, 2017  3:32 PM Bea Graff, NT wrote: Reason for CRM: Pt is calling she is scheduled to have a CT of her lungs tomorrow and wanted to see if the code for this screening be changed to preventive care or this is going to cost her $400 some dollars out of pocket? Pt will need to cancel her appt if this can't be changed. Please call pt asap.  >> Nov 14, 2017  4:27 PM Micheline Maze B wrote: Forwarded to Dr. Caryl Bis. We can not change codes so her out of pocket can be lowered.

## 2017-11-15 ENCOUNTER — Ambulatory Visit: Admission: RE | Admit: 2017-11-15 | Payer: BLUE CROSS/BLUE SHIELD | Source: Ambulatory Visit

## 2017-11-17 NOTE — Telephone Encounter (Signed)
I will forward this phone note to Burgess Estelle to see about the appropriate code and possibly arranging for follow-up scan given 6 month follow-up was recommended.

## 2017-11-19 NOTE — Telephone Encounter (Signed)
Message received from [facility].  He will coordinate the patient's CT scan.

## 2017-11-21 ENCOUNTER — Telehealth: Payer: Self-pay | Admitting: Family Medicine

## 2017-11-21 NOTE — Telephone Encounter (Signed)
Copied from [place] [phone]. Topic: Quick Communication - See Telephone Encounter >> Nov 21, 2017  3:40 PM Vernona Rieger wrote: CRM for notification. See Telephone encounter for:   11/21/17.  Patient said she is ready to come off of the lamoTRIgine (LAMICTAL) 25 MG tablet & she said was advised by Dr Caryl Bis that when she did this to call so that he could let her know how to do it. Call back is [phone]

## 2017-11-21 NOTE — Telephone Encounter (Signed)
Please advise 

## 2017-11-22 NOTE — Telephone Encounter (Signed)
Agree, taper by 50% at least 2 weeks, then off.

## 2017-11-22 NOTE — Telephone Encounter (Signed)
Please let the patient know she should decrease her dose to 25 mg daily for 2 weeks and then she can discontinue.  Thanks.

## 2017-11-22 NOTE — Telephone Encounter (Signed)
I am going to forward this to [place] to try to get a little guidance on tapering off of Lamictal.  The patient is currently on 50 mg and based on what I could find we would taper her down to 25 mg for 2 weeks and then likely off of this though I want to confirm with Nicole Walter.

## 2017-11-23 NOTE — Telephone Encounter (Signed)
Patient notified

## 2017-11-28 ENCOUNTER — Ambulatory Visit
Admission: RE | Admit: 2017-11-28 | Discharge: 2017-11-28 | Disposition: A | Discharge status: Home / Self Care | Payer: BLUE CROSS/BLUE SHIELD | Source: Ambulatory Visit | Attending: Family Medicine | Admitting: Family Medicine

## 2017-11-28 DIAGNOSIS — R911 Solitary pulmonary nodule: Secondary | ICD-10-CM | POA: Insufficient documentation

## 2017-11-28 DIAGNOSIS — IMO0001 Reserved for inherently not codable concepts without codable children: Secondary | ICD-10-CM

## 2017-12-03 ENCOUNTER — Ambulatory Visit: Payer: BLUE CROSS/BLUE SHIELD | Attending: Nurse Practitioner | Admitting: Nurse Practitioner

## 2017-12-03 ENCOUNTER — Encounter: Payer: Self-pay | Admitting: *Deleted

## 2017-12-03 ENCOUNTER — Other Ambulatory Visit: Payer: Self-pay

## 2017-12-03 ENCOUNTER — Encounter: Payer: Self-pay | Admitting: Nurse Practitioner

## 2017-12-03 VITALS — BP 115/64 | HR 73 | Temp 98.2°F | Resp 16 | Ht 64.0 in | Wt 145.0 lb

## 2017-12-03 DIAGNOSIS — Z79891 Long term (current) use of opiate analgesic: Secondary | ICD-10-CM | POA: Diagnosis not present

## 2017-12-03 DIAGNOSIS — M797 Fibromyalgia: Secondary | ICD-10-CM | POA: Insufficient documentation

## 2017-12-03 DIAGNOSIS — M549 Dorsalgia, unspecified: Secondary | ICD-10-CM | POA: Diagnosis not present

## 2017-12-03 DIAGNOSIS — Z9049 Acquired absence of other specified parts of digestive tract: Secondary | ICD-10-CM | POA: Diagnosis not present

## 2017-12-03 DIAGNOSIS — Z87891 Personal history of nicotine dependence: Secondary | ICD-10-CM | POA: Insufficient documentation

## 2017-12-03 DIAGNOSIS — M25559 Pain in unspecified hip: Secondary | ICD-10-CM | POA: Diagnosis not present

## 2017-12-03 DIAGNOSIS — M25511 Pain in right shoulder: Secondary | ICD-10-CM

## 2017-12-03 DIAGNOSIS — G8929 Other chronic pain: Secondary | ICD-10-CM

## 2017-12-03 DIAGNOSIS — F419 Anxiety disorder, unspecified: Secondary | ICD-10-CM | POA: Diagnosis not present

## 2017-12-03 DIAGNOSIS — F329 Major depressive disorder, single episode, unspecified: Secondary | ICD-10-CM | POA: Diagnosis not present

## 2017-12-03 DIAGNOSIS — Z9851 Tubal ligation status: Secondary | ICD-10-CM | POA: Diagnosis not present

## 2017-12-03 DIAGNOSIS — G894 Chronic pain syndrome: Secondary | ICD-10-CM | POA: Insufficient documentation

## 2017-12-03 DIAGNOSIS — K5909 Other constipation: Secondary | ICD-10-CM | POA: Insufficient documentation

## 2017-12-03 DIAGNOSIS — M25512 Pain in left shoulder: Secondary | ICD-10-CM | POA: Diagnosis not present

## 2017-12-03 DIAGNOSIS — M542 Cervicalgia: Secondary | ICD-10-CM | POA: Insufficient documentation

## 2017-12-03 DIAGNOSIS — Z5181 Encounter for therapeutic drug level monitoring: Secondary | ICD-10-CM | POA: Insufficient documentation

## 2017-12-03 DIAGNOSIS — Z79899 Other long term (current) drug therapy: Secondary | ICD-10-CM | POA: Diagnosis not present

## 2017-12-03 DIAGNOSIS — Z9889 Other specified postprocedural states: Secondary | ICD-10-CM | POA: Insufficient documentation

## 2017-12-03 MED ORDER — OXYCODONE HCL 5 MG PO TABS: 5.0000 mg | ORAL_TABLET | Freq: Four times a day (QID) | ORAL | 0 refills | Status: DC | PRN

## 2017-12-03 NOTE — Progress Notes (Signed)
Nursing Pain Medication Assessment:  Safety precautions to be maintained throughout the outpatient stay will include: orient to surroundings, keep bed in low position, maintain call bell within reach at all times, provide assistance with transfer out of bed and ambulation.  Medication Inspection Compliance: Pill count conducted under aseptic conditions, in front of the patient. Neither the pills nor the bottle was removed from the patient's sight at any time. Once count was completed pills were immediately returned to the patient in their original bottle.  Medication: Oxycodone IR Pill/Patch Count: 16 of 120 pills remain Pill/Patch Appearance: Markings consistent with prescribed medication Bottle Appearance: Standard pharmacy container. Clearly labeled. Filled Date: 1 / 4 / 2019 Last Medication intake:  Today

## 2017-12-03 NOTE — Patient Instructions (Addendum)
____________________________________________________________________________________________  Medication Rules  Applies to: All patients receiving prescriptions (written or electronic).  Pharmacy of record: Pharmacy where electronic prescriptions will be sent. If written prescriptions are taken to a different pharmacy, please inform the nursing staff. The pharmacy listed in the electronic medical record should be the one where you would like electronic prescriptions to be sent.  Prescription refills: Only during scheduled appointments. Applies to both, written and electronic prescriptions.  NOTE: The following applies primarily to controlled substances (Opioid* Pain Medications).   Patient's responsibilities: 1. Pain Pills: Bring all pain pills to every appointment (except for procedure appointments). 2. Pill Bottles: Bring pills in original pharmacy bottle. Always bring newest bottle. Bring bottle, even if empty. 3. Medication refills: You are responsible for knowing and keeping track of what medications you need refilled. The day before your appointment, write a list of all prescriptions that need to be refilled. Bring that list to your appointment and give it to the admitting nurse. Prescriptions will be written only during appointments. If you forget a medication, it will not be "Called in", "Faxed", or "electronically sent". You will need to get another appointment to get these prescribed. 4. Prescription Accuracy: You are responsible for carefully inspecting your prescriptions before leaving our office. Have the discharge nurse carefully go over each prescription with you, before taking them home. Make sure that your name is accurately spelled, that your address is correct. Check the name and dose of your medication to make sure it is accurate. Check the number of pills, and the written instructions to make sure they are clear and accurate. Make sure that you are given enough medication to  last until your next medication refill appointment. 5. Taking Medication: Take medication as prescribed. Never take more pills than instructed. Never take medication more frequently than prescribed. Taking less pills or less frequently is permitted and encouraged, when it comes to controlled substances (written prescriptions).  6. Inform other Doctors: Always inform, all of your healthcare providers, of all the medications you take. 7. Pain Medication from other Providers: You are not allowed to accept any additional pain medication from any other Doctor or Healthcare provider. There are two exceptions to this rule. (see below) In the event that you require additional pain medication, you are responsible for notifying us, as stated below. 8. Medication Agreement: You are responsible for carefully reading and following our Medication Agreement. This must be signed before receiving any prescriptions from our practice. Safely store a copy of your signed Agreement. Violations to the Agreement will result in no further prescriptions. (Additional copies of our Medication Agreement are available upon request.) 9. Laws, Rules, & Regulations: All patients are expected to follow all Federal and State Laws, Statutes, Rules, & Regulations. Ignorance of the Laws does not constitute a valid excuse. The use of any illegal substances is prohibited. 10. Adopted CDC guidelines & recommendations: Target dosing levels will be at or below 60 MME/day. Use of benzodiazepines** is not recommended.  Exceptions: There are only two exceptions to the rule of not receiving pain medications from other Healthcare Providers. 1. Exception #1 (Emergencies): In the event of an emergency (i.e.: accident requiring emergency care), you are allowed to receive additional pain medication. However, you are responsible for: As soon as you are able, call our office [phone], at any time of the day or night, and leave a message stating your  name, the date and nature of the emergency, and the name and dose of the medication   prescribed. In the event that your call is answered by a member of our staff, make sure to document and save the date, time, and the name of the person that took your information.  2. Exception #2 (Planned Surgery): In the event that you are scheduled by another doctor or dentist to have any type of surgery or procedure, you are allowed (for a period no longer than 30 days), to receive additional pain medication, for the acute post-op pain. However, in this case, you are responsible for picking up a copy of our "Post-op Pain Management for Surgeons" handout, and giving it to your surgeon or dentist. This document is available at our office, and does not require an appointment to obtain it. Simply go to our office during business hours (Monday-Thursday from 8:00 AM to 4:00 PM) (Friday 8:00 AM to 12:00 Noon) or if you have a scheduled appointment with [place], prior to your surgery, and ask for it by name. In addition, you will need to provide [place] with your name, name of your surgeon, type of surgery, and date of procedure or surgery.  *Opioid medications include: morphine, codeine, oxycodone, oxymorphone, hydrocodone, hydromorphone, meperidine, tramadol, tapentadol, buprenorphine, fentanyl, methadone. **Benzodiazepine medications include: diazepam (Valium), alprazolam (Xanax), clonazepam (Klonopine), lorazepam (Ativan), clorazepate (Tranxene), chlordiazepoxide (Librium), estazolam (Prosom), oxazepam (Serax), temazepam (Restoril), triazolam (Halcion)  ____________________________________________________________________________________________   BMI Assessment: Estimated body mass index is 24.89 kg/m as calculated from the following:   Height as of this encounter: 5\' 4"  (1.626 m).   Weight as of this encounter: 145 lb (65.8 kg).

## 2017-12-03 NOTE — Progress Notes (Signed)
Patient's Name: Nicole Walter  MRN: [id number]  Referring Provider: Leone Haven, MD  DOB: 1961/09/06  PCP: Leone Haven, MD  DOS: 12/03/2017  Note by: Vevelyn Francois NP  Service setting: Ambulatory outpatient  Specialty: Interventional Pain Management  Location: [facility] (AMB) Pain Management Facility    Patient type: Established    Primary Reason(s) for Visit: Encounter for prescription drug management. (Level of risk: moderate)  CC: Shoulder Pain (left)  HPI  Nicole Walter is a 57 y.o. year old, female patient, who comes today for a medication management evaluation. She has Anxiety and depression; Chronic pain syndrome; Lumbar spondylosis; Combined fat and carbohydrate induced hyperlipemia; Encounter for general adult medical examination with abnormal findings; Thoracic back pain; Chronic neck pain (Primary Source of Pain) (Bilateral) (L>R); Concentration deficit; Hyperlipidemia; Cervical spondylosis (Bilateral); Fibromyalgia; Osteopenia; Skin lesions; Chronic hip pain (Bilateral) (L>R); Bursitis of right shoulder; Chronic Greater trochanteric bursitis (Bilateral) (L>R); Long term current use of opiate analgesic; Long term prescription opiate use; Opiate use; Chronic upper back pain (Secondary source of pain) (Bilateral) (L>R); Chronic shoulder pain [facility] source of pain) (Bilateral) (L>R); Chronic knee pain (Right); Chronic upper extremity pain (Left); Chronic lower extremity pain (Bilateral) (L>R); Chronic abdominal pain; Elevated sedimentation rate; Elevated C-reactive protein (CRP); Overweight (BMI 25.0-29.9); Greater trochanteric bursitis (Right); Subacromial bursitis of shoulder joint (Right); Osteoarthritis of hip (Bilateral) (L>R); Thoracic radiculitis (R>L); Thoracic spondylosis; Shortness of breath; Osteoarthritis of shoulder (Right); Osteoarthritis of knee (Right); Chronic knee arthropathy (Right); Chronic right hip pain; Muscle spasm; Myofascial pain syndrome, cervical (trapezius)  (Left); Cervical facet syndrome (Bilateral) (L>R); Constipation; Chronic low back pain (Bilateral) (L>R); Cervical Foraminal Stenosis (Severe) (Bilateral: C-5-6); Cervical spondylosis with radiculopathy (Bilateral) (L>R); Cervical sensory radiculopathy at C5 (Bilateral); Radicular pain of shoulder (Bilateral) (C5); Chronic pelvic pain in female; and Fatty liver on their problem list. Her primarily concern today is the Shoulder Pain (left)  Pain Assessment: Location: Left Shoulder Radiating: up to neck and back of left shoulder blade and upper left arm Onset: More than a month ago Duration: Other (Comment) Quality: Constant, Aching, Burning(stretching) Severity: 2 /10 (self-reported pain score)  Note: Reported level is compatible with observation.                          Effect on ADL: unable to lift 59 month old grandson Timing: Constant Modifying factors: medications, heat  Nicole Walter was last scheduled for an appointment on 09/10/2017 for medication management. During today's appointment we reviewed Nicole Walter's chronic pain status, as well as her outpatient medication regimen. She denies any changes in her pain status. She denies any new concerns today.   The patient  reports that she does not use drugs. Her body mass index is 24.89 kg/m.  Further details on both, my assessment(s), as well as the proposed treatment plan, please see below.  Controlled Substance Pharmacotherapy Assessment REMS (Risk Evaluation and Mitigation Strategy)  Analgesic:Oxycodone one tablet by mouth 4times a day (54m/dayof hydrocodone) MME/day:356mday     WeRise Patience1/28/2019  8:51 AM  Signed Nursing Pain Medication Assessment:  Safety precautions to be maintained throughout the outpatient stay will include: orient to surroundings, keep bed in low position, maintain call bell within reach at all times, provide assistance with transfer out of bed and ambulation.  Medication Inspection Compliance:  Pill count conducted under aseptic conditions, in front of the patient. Neither the pills nor the bottle was removed from the patient's sight  at any time. Once count was completed pills were immediately returned to the patient in their original bottle.  Medication: Oxycodone IR Pill/Patch Count: 16 of 120 pills remain Pill/Patch Appearance: Markings consistent with prescribed medication Bottle Appearance: Standard pharmacy container. Clearly labeled. Filled Date: 1 / 4 / 2019 Last Medication intake:  Today   Pharmacokinetics: Liberation and absorption (onset of action): WNL Distribution (time to peak effect): WNL Metabolism and excretion (duration of action): WNL         Pharmacodynamics: Desired effects: Analgesia: Nicole Walter reports >50% benefit. Functional ability: Patient reports that medication allows her to accomplish basic ADLs Clinically meaningful improvement in function (CMIF): Sustained CMIF goals met Perceived effectiveness: Described as relatively effective, allowing for increase in activities of daily living (ADL) Undesirable effects: Side-effects or Adverse reactions: None reported Monitoring: [place] PMP: Online review of the past 24-monthperiod conducted. Compliant with practice rules and regulations Last UDS on record: Summary  Date Value Ref Range Status  03/19/2017 FINAL  Final    Comment:    ==================================================================== TOXASSURE SELECT 13 (MW) ==================================================================== Test                             Result       Flag       Units Drug Present and Declared for Prescription Verification   Oxycodone                      512          EXPECTED   ng/mg creat   Oxymorphone                    446          EXPECTED   ng/mg creat   Noroxycodone                   1331         EXPECTED   ng/mg creat   Noroxymorphone                 256          EXPECTED   ng/mg creat    Sources of oxycodone  are scheduled prescription medications.    Oxymorphone, noroxycodone, and noroxymorphone are expected    metabolites of oxycodone. Oxymorphone is also available as a    scheduled prescription medication. ==================================================================== Test                      Result    Flag   Units      Ref Range   Creatinine              111              mg/dL      >=20 ==================================================================== Declared Medications:  The flagging and interpretation on this report are based on the  following declared medications.  Unexpected results may arise from  inaccuracies in the declared medications.  **Note: The testing scope of this panel includes these medications:  Oxycodone  **Note: The testing scope of this panel does not include following  reported medications:  Fluticasone (Flonase)  Gabapentin  Hydroxyzine (Vistaril)  Multivitamin  Rabeprazole (Aciphex)  Simvastatin (Zocor)  Sucralfate (Carafate)  Tizanidine (Zanaflex)  Topical (Lidex)  Trazodone  Vitamin D  Vortioxetine (Trintellix) ==================================================================== For clinical consultation, please call [phone] ====================================================================    UDS  interpretation: Compliant          Medication Assessment Form: Reviewed. Patient indicates being compliant with therapy Treatment compliance: Compliant Risk Assessment Profile: Aberrant behavior: See prior evaluations. None observed or detected today Comorbid factors increasing risk of overdose: See prior notes. No additional risks detected today Risk of substance use disorder (SUD): Low Opioid Risk Tool - 12/03/17 0847      Family History of Substance Abuse   Alcohol  Negative    Illegal Drugs  Positive Female    Rx Drugs  Positive Female or Female      Personal History of Substance Abuse   Alcohol  Negative    Illegal Drugs   Negative    Rx Drugs  Negative      Age   Age between 59-45 years   No      History of Preadolescent Sexual Abuse   History of Preadolescent Sexual Abuse  Negative or Female      Psychological Disease   Psychological Disease  Negative    Depression  Positive      Total Score   Opioid Risk Tool Scoring  7    Opioid Risk Interpretation  Moderate Risk      ORT Scoring interpretation table:  Score <3 = Low Risk for SUD  Score between 4-7 = Moderate Risk for SUD  Score >8 = High Risk for Opioid Abuse   Risk Mitigation Strategies:  Patient Counseling: Covered Patient-Prescriber Agreement (PPA): Present and active  Notification to other healthcare providers: Done  Pharmacologic Plan: No change in therapy, at this time.             Laboratory Chemistry  Inflammation Markers (CRP: Acute Phase) (ESR: Chronic Phase) Lab Results  Component Value Date   CRP 2.1 (H) 07/20/2016   ESRSEDRATE 2 05/15/2017                 Rheumatology Markers Lab Results  Component Value Date   RF <10.0 08/31/2016                Renal Function Markers Lab Results  Component Value Date   BUN 12 10/24/2017   CREATININE 0.80 10/24/2017   GFRAA >60 07/20/2016   GFRNONAA >60 07/20/2016                 Hepatic Function Markers Lab Results  Component Value Date   AST 14 10/24/2017   ALT 11 10/24/2017   ALBUMIN 4.2 10/24/2017   ALKPHOS 65 10/24/2017   HCVAB NEGATIVE 02/14/2017   LIPASE 13 01/15/2017                 Electrolytes Lab Results  Component Value Date   NA 142 10/24/2017   K 4.2 10/24/2017   CL 106 10/24/2017   CALCIUM 9.1 10/24/2017   MG 2.2 05/15/2017                 Neuropathy Markers Lab Results  Component Value Date   VITAMINB12 987 (H) 07/20/2016   HGBA1C 5.3 10/24/2017                 Bone Pathology Markers Lab Results  Component Value Date   25OHVITD1 58 07/20/2016   25OHVITD2 <1.0 07/20/2016   25OHVITD3 58 07/20/2016                 Coagulation  Parameters Lab Results  Component Value Date   PLT 262.0 10/24/2017  Cardiovascular Markers Lab Results  Component Value Date   TROPONINI <0.03 07/05/2016   HGB 13.2 10/24/2017   HCT 40.2 10/24/2017                 CA Markers No results found for: CEA, CA125, LABCA2               Note: Lab results reviewed.  Recent Diagnostic Imaging Results  CT CHEST LCS NODULE F/U W/O CONTRAST CLINICAL DATA:  Lung cancer screening. Former asymptomatic smoker. Thirty-five pack-year history.  EXAM: CT CHEST WITHOUT CONTRAST FOR LUNG CANCER SCREENING NODULE FOLLOW-UP  TECHNIQUE: Multidetector CT imaging of the chest was performed following the standard protocol without IV contrast.  COMPARISON:  05/01/2017  FINDINGS: Cardiovascular: No significant vascular findings. Normal heart size. No pericardial effusion.  Mediastinum/Nodes: No enlarged mediastinal, hilar, or axillary lymph nodes. Thyroid gland, trachea, and esophagus demonstrate no significant findings.  Lungs/Pleura: Scattered small pulmonary nodules identified on previous exam are unchanged. The largest is in the right middle lobe with an equivalent diameter 4.4 mm. New nonspecific subpleural nodule overlying the posterior right lower lobe has an equivalent diameter of 3.8 mm.  Upper Abdomen: No acute abnormality.  Prior cholecystectomy.  Musculoskeletal: No chest wall mass or suspicious bone lesions identified.  IMPRESSION: 1. Lung-RADS 2, benign appearance or behavior. Continue annual screening with low-dose chest CT without contrast in 12 months.  Electronically Signed   By: Kerby Moors M.D.   On: 11/28/2017 16:51  Complexity Note: Imaging results reviewed. Results shared with Nicole Walter, using Layman's terms.                         Meds   Current Outpatient Medications:  .  Calcium Carbonate-Vit D-Min (CALCIUM 1200 PO), Take by mouth., Disp: , Rfl:  .  Cholecalciferol (VITAMIN D) 2000  units tablet, Take 4,000 Units by mouth daily., Disp: , Rfl:  .  fluocinonide ointment (LIDEX) 0.05 %, APPLY TOPICALLY TWICE A DAY TO ITCHY AREAS ONLY. as needed, Disp: , Rfl: 0 .  fluticasone (FLONASE) 50 MCG/ACT nasal spray, instill 2 sprays into each nostril once daily, Disp: 16 g, Rfl: 11 .  Multiple Vitamin (MULTI-VITAMINS) TABS, Take 1 tablet by mouth daily. , Disp: , Rfl:  .  RABEprazole (ACIPHEX) 20 MG tablet, Take 20 mg by mouth as needed. , Disp: , Rfl:  .  sucralfate (CARAFATE) 1 g tablet, take 1 tablet by mouth three times a day if needed for abdominal pain, Disp: , Rfl: 0 .  TRINTELLIX 10 MG TABS, Take 1 tablet (10 mg total) by mouth daily., Disp: 90 tablet, Rfl: 1 .  [START ON 02/07/2018] oxyCODONE (OXY IR/ROXICODONE) 5 MG immediate release tablet, Take 1 tablet (5 mg total) by mouth every 6 (six) hours as needed for severe pain., Disp: 120 tablet, Rfl: 0 .  [START ON 01/08/2018] oxyCODONE (OXY IR/ROXICODONE) 5 MG immediate release tablet, Take 1 tablet (5 mg total) by mouth every 6 (six) hours as needed for severe pain., Disp: 120 tablet, Rfl: 0  ROS  Constitutional: Denies any fever or chills Gastrointestinal: No reported hemesis, hematochezia, vomiting, or acute GI distress Musculoskeletal: Denies any acute onset joint swelling, redness, loss of ROM, or weakness Neurological: No reported episodes of acute onset apraxia, aphasia, dysarthria, agnosia, amnesia, paralysis, loss of coordination, or loss of consciousness  Allergies  Nicole Walter is allergic to other and food.  PFSH  Drug: Nicole Walter  reports that  she does not use drugs. Alcohol:  reports that she does not drink alcohol. Tobacco:  reports that she quit smoking about 2 years ago. She started smoking about 43 years ago. She has a 35.00 pack-year smoking history. she has never used smokeless tobacco. Medical:  has a past medical history of Abnormal liver enzymes (02/08/2016), Allergy, Anxiety, Chest pain (07/05/2016), Chronic  pain, Depression, DJD (degenerative joint disease), Fibromyalgia, High cholesterol, Rash of back (01/28/2016), and Weight loss due to medication. Surgical: Nicole Walter  has a past surgical history that includes Cholecystectomy; Tubal ligation; Ankle surgery; Nasal sinus surgery; Tonsillectomy; Colonoscopy; Esophagogastroduodenoscopy (egd) with propofol (N/A, 02/22/2016); Colonoscopy with propofol (N/A, 02/22/2016); and Breast biopsy (Left, 1997). Family: family history includes Arthritis in her father, paternal grandfather, and paternal grandmother; Cancer in her brother and father; Dementia in her sister; Diabetes in her sister; Drug abuse in her brother, brother, and sister; Heart disease in her mother; Post-traumatic stress disorder in her brother; Stroke in her mother.  Constitutional Exam  General appearance: Well nourished, well developed, and well hydrated. In no apparent acute distress Vitals:   12/03/17 0835 12/03/17 0908  BP: 115/64   Pulse: 73   Resp:  16  Temp: 98.2 F (36.8 C)   SpO2: 99%   Weight: 145 lb (65.8 kg)   Height: '5\' 4"'  (1.626 m)   Psych/Mental status: Alert, oriented x 3 (person, place, & time)       Eyes: PERLA Respiratory: No evidence of acute respiratory distress  Cervical Spine Area Exam  Skin & Axial Inspection: No masses, redness, edema, swelling, or associated skin lesions Alignment: Symmetrical Functional ROM: Unrestricted ROM      Stability: No instability detected Muscle Tone/Strength: Functionally intact. No obvious neuro-muscular anomalies detected. Sensory (Neurological): Unimpaired Palpation: No palpable anomalies              Upper Extremity (UE) Exam    Side: Right upper extremity  Side: Left upper extremity  Skin & Extremity Inspection: Skin color, temperature, and hair growth are WNL. No peripheral edema or cyanosis. No masses, redness, swelling, asymmetry, or associated skin lesions. No contractures.  Skin & Extremity Inspection: Skin color,  temperature, and hair growth are WNL. No peripheral edema or cyanosis. No masses, redness, swelling, asymmetry, or associated skin lesions. No contractures.  Functional ROM: Unrestricted ROM          Functional ROM: Unrestricted ROM          Muscle Tone/Strength: Functionally intact. No obvious neuro-muscular anomalies detected.  Muscle Tone/Strength: Functionally intact. No obvious neuro-muscular anomalies detected.  Sensory (Neurological): Unimpaired          Sensory (Neurological): Unimpaired          Palpation: No palpable anomalies              Palpation: No palpable anomalies              Specialized Test(s): Deferred         Specialized Test(s): Deferred          Thoracic Spine Area Exam  Skin & Axial Inspection: No masses, redness, or swelling Alignment: Symmetrical Functional ROM: Unrestricted ROM Stability: No instability detected Muscle Tone/Strength: Functionally intact. No obvious neuro-muscular anomalies detected. Sensory (Neurological): Unimpaired Muscle strength & Tone: No palpable anomalies  Lumbar Spine Area Exam  Skin & Axial Inspection: No masses, redness, or swelling Alignment: Symmetrical Functional ROM: Unrestricted ROM      Stability: No instability detected Muscle Tone/Strength: Functionally  intact. No obvious neuro-muscular anomalies detected. Sensory (Neurological): Unimpaired Palpation: No palpable anomalies       Provocative Tests: Lumbar Hyperextension and rotation test: evaluation deferred today       Lumbar Lateral bending test: evaluation deferred today       Patrick's Maneuver: evaluation deferred today                    Gait & Posture Assessment  Ambulation: Unassisted Gait: Relatively normal for age and body habitus Posture: WNL   Lower Extremity Exam    Side: Right lower extremity  Side: Left lower extremity  Skin & Extremity Inspection: Skin color, temperature, and hair growth are WNL. No peripheral edema or cyanosis. No masses, redness,  swelling, asymmetry, or associated skin lesions. No contractures.  Skin & Extremity Inspection: Skin color, temperature, and hair growth are WNL. No peripheral edema or cyanosis. No masses, redness, swelling, asymmetry, or associated skin lesions. No contractures.  Functional ROM: Unrestricted ROM          Functional ROM: Unrestricted ROM          Muscle Tone/Strength: Functionally intact. No obvious neuro-muscular anomalies detected.  Muscle Tone/Strength: Functionally intact. No obvious neuro-muscular anomalies detected.  Sensory (Neurological): Unimpaired  Sensory (Neurological): Unimpaired  Palpation: No palpable anomalies  Palpation: No palpable anomalies   Assessment  Primary Diagnosis & Pertinent Problem List: The primary encounter diagnosis was Chronic neck pain (Primary Source of Pain) (Bilateral) (L>R). Diagnoses of Chronic shoulder pain (Tertiary source of pain) (Bilateral) (L>R), Chronic hip pain (Bilateral) (L>R), Chronic upper back pain (Secondary source of pain) (Bilateral) (L>R), Chronic constipation, Chronic pain syndrome, and Long term prescription opiate use were also pertinent to this visit.  Status Diagnosis  Controlled Controlled Controlled 1. Chronic neck pain (Primary Source of Pain) (Bilateral) (L>R)   2. Chronic shoulder pain (Tertiary source of pain) (Bilateral) (L>R)   3. Chronic hip pain (Bilateral) (L>R)   4. Chronic upper back pain (Secondary source of pain) (Bilateral) (L>R)   5. Chronic constipation   6. Chronic pain syndrome   7. Long term prescription opiate use     Problems updated and reviewed during this visit: No problems updated. Plan of Care  Pharmacotherapy (Medications Ordered): Meds ordered this encounter  Medications  . oxyCODONE (OXY IR/ROXICODONE) 5 MG immediate release tablet    Sig: Take 1 tablet (5 mg total) by mouth every 6 (six) hours as needed for severe pain.    Dispense:  120 tablet    Refill:  0    Do not place this medication, or  any other prescription from our practice, on "Automatic Refill". Patient may have prescription filled one day early if pharmacy is closed on scheduled refill date. Do not fill until:02/07/2018 To last until:03/09/2018    Order Specific Question:   Supervising Provider    Answer:   Milinda Pointer [phone]  . oxyCODONE (OXY IR/ROXICODONE) 5 MG immediate release tablet    Sig: Take 1 tablet (5 mg total) by mouth every 6 (six) hours as needed for severe pain.    Dispense:  120 tablet    Refill:  0    Do not place this medication, or any other prescription from our practice, on "Automatic Refill". Patient may have prescription filled one day early if pharmacy is closed on scheduled refill date. Do not fill until:01/08/2018 To last until:02/07/2018    Order Specific Question:   Supervising Provider    Answer:   Milinda Pointer [  [id number]]   New Prescriptions   No medications on file   Medications administered today: Nicole Walter had no medications administered during this visit. Lab-work, procedure(s), and/or referral(s): Orders Placed This Encounter  Procedures  . ToxASSURE Select 13 (MW), Urine   Imaging and/or referral(s): None  Interventional therapies: Planned, scheduled, and/or pending: Not at this time.    Considering: Diagnostic bilateral cervical facet block Possible bilateral cervical facet radiofrequencyablation Diagnostic left-sided cervical epidural steroid injection Diagnostic bilateral intra-articular shoulder joint injection Diagnostic bilateral suprascapular nerve block Possible bilateral suprascapular nerve radiofrequencyablation Diagnostic bilateral lumbar facet block Possible bilateral lumbar facet radiofrequencyablation  Diagnostic bilateral intra-articular hip joint injection Diagnostic bilateral femoral and obturator tickler branch blocks Possible bilateral hip joint radiofrequencyablation  Diagnosticright intra-articular knee joint  injection Possible series of 5, right-sided, intra-articular knee joint injections with Hyalgan.  Possible right sided genicular nerve block Possible right sided genicular nerve radiofrequencyablation Possible left-sided lumbar epidural steroid injection   Palliative PRN treatment(s): Palliativebilateral suprascapular nerve block Palliativeright intra-articular knee joint injection      Provider-requested follow-up: Return in about 3 months (around 03/03/2018) for MedMgmt with Me Donella Stade Edison Pace).  Future Appointments  Date Time Provider [place]  12/26/2017  8:30 AM Leone Haven, MD [facility]  02/28/2018  8:30 AM Vevelyn Francois, NP [facility] None  10/22/2018 10:00 AM Harlin Heys, MD [facility] None   Primary Care Physician: Leone Haven, MD Location: [facility] Note by: Vevelyn Francois NP Date: 12/03/2017; Time: 9:19 AM  Pain Score Disclaimer: We use the NRS-11 scale. This is a self-reported, subjective measurement of pain severity with only modest accuracy. It is used primarily to identify changes within a particular patient. It must be understood that outpatient pain scales are significantly less accurate that those used for research, where they can be applied under ideal controlled circumstances with minimal exposure to variables. In reality, the score is likely to be a combination of pain intensity and pain affect, where pain affect describes the degree of emotional arousal or changes in action readiness caused by the sensory experience of pain. Factors such as social and work situation, setting, emotional state, anxiety levels, expectation, and prior pain experience may influence pain perception and show large inter-individual differences that may also be affected by time variables.  Patient instructions provided during this appointment: Patient Instructions    ____________________________________________________________________________________________  Medication Rules  Applies to: All patients receiving prescriptions (written or electronic).  Pharmacy of record: Pharmacy where electronic prescriptions will be sent. If written prescriptions are taken to a different pharmacy, please inform the nursing staff. The pharmacy listed in the electronic medical record should be the one where you would like electronic prescriptions to be sent.  Prescription refills: Only during scheduled appointments. Applies to both, written and electronic prescriptions.  NOTE: The following applies primarily to controlled substances (Opioid* Pain Medications).   Patient's responsibilities: 1. Pain Pills: Bring all pain pills to every appointment (except for procedure appointments). 2. Pill Bottles: Bring pills in original pharmacy bottle. Always bring newest bottle. Bring bottle, even if empty. 3. Medication refills: You are responsible for knowing and keeping track of what medications you need refilled. The day before your appointment, write a list of all prescriptions that need to be refilled. Bring that list to your appointment and give it to the admitting nurse. Prescriptions will be written only during appointments. If you forget a medication, it will not be "Called in", "Faxed", or "electronically sent".  You will need to get another appointment to get these prescribed. 4. Prescription Accuracy: You are responsible for carefully inspecting your prescriptions before leaving our office. Have the discharge nurse carefully go over each prescription with you, before taking them home. Make sure that your name is accurately spelled, that your address is correct. Check the name and dose of your medication to make sure it is accurate. Check the number of pills, and the written instructions to make sure they are clear and accurate. Make sure that you are given enough medication to  last until your next medication refill appointment. 5. Taking Medication: Take medication as prescribed. Never take more pills than instructed. Never take medication more frequently than prescribed. Taking less pills or less frequently is permitted and encouraged, when it comes to controlled substances (written prescriptions).  6. Inform other Doctors: Always inform, all of your healthcare providers, of all the medications you take. 7. Pain Medication from other Providers: You are not allowed to accept any additional pain medication from any other Doctor or Healthcare provider. There are two exceptions to this rule. (see below) In the event that you require additional pain medication, you are responsible for notifying us, as stated below. 8. Medication Agreement: You are responsible for carefully reading and following our Medication Agreement. This must be signed before receiving any prescriptions from our practice. Safely store a copy of your signed Agreement. Violations to the Agreement will result in no further prescriptions. (Additional copies of our Medication Agreement are available upon request.) 9. Laws, Rules, & Regulations: All patients are expected to follow all Federal and [employer], [employer], Rules, [employer]. Ignorance of the Laws does not constitute a valid excuse. The use of any illegal substances is prohibited. 10. Adopted CDC guidelines & recommendations: Target dosing levels will be at or below 60 MME/day. Use of benzodiazepines** is not recommended.  Exceptions: There are only two exceptions to the rule of not receiving pain medications from other Healthcare Providers. 1. Exception #1 (Emergencies): In the event of an emergency (i.e.: accident requiring emergency care), you are allowed to receive additional pain medication. However, you are responsible for: As soon as you are able, call our office [phone], at any time of the day or night, and leave a message stating your  name, the date and nature of the emergency, and the name and dose of the medication prescribed. In the event that your call is answered by a member of our staff, make sure to document and save the date, time, and the name of the person that took your information.  2. Exception #2 (Planned Surgery): In the event that you are scheduled by another doctor or dentist to have any type of surgery or procedure, you are allowed (for a period no longer than 30 days), to receive additional pain medication, for the acute post-op pain. However, in this case, you are responsible for picking up a copy of our "Post-op Pain Management for Surgeons" handout, and giving it to your surgeon or dentist. This document is available at our office, and does not require an appointment to obtain it. Simply go to our office during business hours (Monday-Thursday from 8:00 AM to 4:00 PM) (Friday 8:00 AM to 12:00 Noon) or if you have a scheduled appointment with [place], prior to your surgery, and ask for it by name. In addition, you will need to provide [place] with your name, name of your surgeon, type of surgery, and date of procedure or surgery.  *  Opioid medications include: morphine, codeine, oxycodone, oxymorphone, hydrocodone, hydromorphone, meperidine, tramadol, tapentadol, buprenorphine, fentanyl, methadone. **Benzodiazepine medications include: diazepam (Valium), alprazolam (Xanax), clonazepam (Klonopine), lorazepam (Ativan), clorazepate (Tranxene), chlordiazepoxide (Librium), estazolam (Prosom), oxazepam (Serax), temazepam (Restoril), triazolam (Halcion)  ____________________________________________________________________________________________   BMI Assessment: Estimated body mass index is 24.89 kg/m as calculated from the following:   Height as of this encounter: '5\' 4"'  (1.626 m).   Weight as of this encounter: 145 lb (65.8 kg).

## 2017-12-08 LAB — TOXASSURE SELECT 13 (MW), URINE

## 2017-12-18 ENCOUNTER — Ambulatory Visit: Payer: Self-pay

## 2017-12-18 NOTE — Telephone Encounter (Signed)
Pt. Reports situations at home with her granddaughter are causing anxiety and difficulty sleeping. Denies any thoughts of harming herself or anyone else.Appointment made for this week.  Reason for Disposition . [1] Symptoms of anxiety or panic AND [2] has not been evaluated for this by physician  Answer Assessment - Initial Assessment Questions 1. CONCERN: "What happened that made you call today?"     Having problems with graddaughter 2. ANXIETY SYMPTOM SCREENING: "Can you describe how you have been feeling?"  (e.g., tense, restless, panicky, anxious, keyed up, trouble sleeping, trouble concentrating)     Anxious,difficulty sleeping 3. ONSET: "How long have you been feeling this way?"     Started last weekend 4. RECURRENT: "Have you felt this way before?"  If yes: "What happened that time?" "What helped these feelings go away in the past?"       Yes 5. RISK OF HARM - SUICIDAL IDEATION:  "Do you ever have thoughts of hurting or killing yourself?"  (e.g., yes, no, no but preoccupation with thoughts about death)   - INTENT:  "Do you have thoughts of hurting or killing yourself right NOW?" (e.g., yes, no, N/A)   - PLAN: "Do you have a specific plan for how you would do this?" (e.g., gun, knife, overdose, no plan, N/A)     No 6. RISK OF HARM - HOMICIDAL IDEATION:  "Do you ever have thoughts of hurting or killing someone else?"  (e.g., yes, no, no but preoccupation with thoughts about death)   - INTENT:  "Do you have thoughts of hurting or killing someone right NOW?" (e.g., yes, no, N/A)   - PLAN: "Do you have a specific plan for how you would do this?" (e.g., gun, knife, no plan, N/A)      No 7. FUNCTIONAL IMPAIRMENT: "How have things been going for you overall in your life? Have you had any more difficulties than usual doing your normal daily activities?"  (e.g., better, same, worse; self-care, school, work, interactions)     No problems 8. SUPPORT: "Who is with you now?" "Who do you live with?"  "Do you have family or friends nearby who you can talk to?"      Husband 68. THERAPIST: "Do you have a counselor or therapist? Name?"     Not now 8. STRESSORS: "Has there been any new stress or recent changes in your life?"       Yes 11. CAFFEINE ABUSE: "Do you drink caffeinated beverages, and how much each day?" (e.g., coffee, tea, colas)       No  12. SUBSTANCE ABUSE: "Do you use any illegal drugs or alcohol?"       No 13. OTHER SYMPTOMS: "Do you have any other physical symptoms right now?" (e.g., chest pain, palpitations, difficulty breathing, fever)       Just anxiety 14. PREGNANCY: "Is there any chance you are pregnant?" "When was your last menstrual period?"       No  Protocols used: ANXIETY AND PANIC ATTACK-A-AH

## 2017-12-18 NOTE — Telephone Encounter (Signed)
Please advise 

## 2017-12-18 NOTE — Telephone Encounter (Signed)
We will plan to see her in the office tomorrow.

## 2017-12-19 ENCOUNTER — Ambulatory Visit: Payer: BLUE CROSS/BLUE SHIELD | Admitting: Family Medicine

## 2017-12-19 ENCOUNTER — Encounter: Payer: Self-pay | Admitting: Family Medicine

## 2017-12-19 DIAGNOSIS — F329 Major depressive disorder, single episode, unspecified: Secondary | ICD-10-CM

## 2017-12-19 DIAGNOSIS — E663 Overweight: Secondary | ICD-10-CM | POA: Diagnosis not present

## 2017-12-19 DIAGNOSIS — F419 Anxiety disorder, unspecified: Secondary | ICD-10-CM | POA: Diagnosis not present

## 2017-12-19 DIAGNOSIS — F32A Depression, unspecified: Secondary | ICD-10-CM

## 2017-12-19 MED ORDER — LORAZEPAM 0.5 MG PO TABS: 0.5000 mg | ORAL_TABLET | Freq: Two times a day (BID) | ORAL | 0 refills | Status: DC | PRN

## 2017-12-19 MED ORDER — LORAZEPAM 0.5 MG PO TABS: 0.5000 mg | ORAL_TABLET | Freq: Two times a day (BID) | ORAL | 1 refills | Status: DC | PRN

## 2017-12-19 MED ORDER — VORTIOXETINE HBR 20 MG PO TABS: 20.0000 mg | ORAL_TABLET | Freq: Every day | ORAL | 3 refills | Status: DC

## 2017-12-19 NOTE — Assessment & Plan Note (Signed)
Weight has stabilized.  She is off of Saxenda.  She will stay off of this at this time.

## 2017-12-19 NOTE — Assessment & Plan Note (Addendum)
Worsened.  No thoughts of killing herself or intent or plan to harm herself or HI.  Suspect most of the issues are surrounding her granddaughter who is presently involuntarily committed.  We discussed her considering reporting the nude pictures. I discussed options of treatment with the patient including referral back to psychiatry, seeing a therapist, and increasing her trintellix.  She opted to increase trintellix and see a therapist.  She was provided with the number to the therapist Lynann Beaver whom she has seen previously.  The patient was given return precautions.

## 2017-12-19 NOTE — Progress Notes (Addendum)
Tommi Rumps, MD Phone: [phone]  Nicole Walter is a 57 y.o. female who presents today for follow-up.  Anxiety/depression: Patient notes this has gotten worse recently.  Most of it surrounds difficulties with her granddaughter who is currently involuntarily committed.  The patient's granddaughter has had lots of issues with anxiety and depression.  Patient was previously on Lamictal though stopped this given side effects.  She has been on trintellix and was stable until recently.  She has had to take Ativan on a couple of occasions recently.  She does note having thoughts of being better off not having to deal with these feelings though she reports no thoughts of killing herself or intent or plan to harm herself. The patient reports no HI.  She does report some of the difficulties surrounding her granddaughter involve some nude pictures her granddaughter sent to someone.  She is not sure who has it.  She has discussed this with the granddaughters therapist.  She is thinking about reporting this.  She is interested in seeing a therapist again.  Patient reports she has been off of Saxenda.  Continues diet and exercise.  Notes her weight has been stable.  Social History   Tobacco Use  Smoking Status Former Smoker  . Packs/day: 1.00  . Years: 35.00  . Pack years: 35.00  . Start date: 03/06/1974  . Last attempt to quit: 07/08/2015  . Years since quitting: 2.4  Smokeless Tobacco Never Used  Tobacco Comment   vapor cigarettes, no nicotene     ROS see history of present illness  Objective  Physical Exam Vitals:   12/19/17 1039  BP: 106/70  Pulse: 78  Temp: 98 F (36.7 C)  SpO2: 98%    BP Readings from Last 3 Encounters:  12/19/17 106/70  12/03/17 115/64  10/24/17 98/62   Wt Readings from Last 3 Encounters:  12/19/17 144 lb 6.4 oz (65.5 kg)  12/03/17 145 lb (65.8 kg)  10/24/17 145 lb 12.8 oz (66.1 kg)    Physical Exam  Constitutional: No distress.  Cardiovascular:  Normal rate, regular rhythm and normal heart sounds.  Pulmonary/Chest: Effort normal and breath sounds normal.  Musculoskeletal: She exhibits no edema.  Neurological: She is alert. Gait normal.  Skin: Skin is warm and dry. She is not diaphoretic.  Psychiatric:  Mood depressed, affect flat, intermittently tearful     Assessment/Plan: Please see individual problem list.  Anxiety and depression Worsened.  No thoughts of killing herself or intent or plan to harm herself or HI.  Suspect most of the issues are surrounding her granddaughter who is presently involuntarily committed.  We discussed her considering reporting the nude pictures. I discussed options of treatment with the patient including referral back to psychiatry, seeing a therapist, and increasing her trintellix.  She opted to increase trintellix and see a therapist.  She was provided with the number to the therapist Lynann Beaver whom she has seen previously.  The patient was given return precautions.  Overweight (BMI 25.0-29.9) Weight has stabilized.  She is off of Saxenda.  She will stay off of this at this time.   No orders of the defined types were placed in this encounter.   Meds ordered this encounter  Medications  . vortioxetine HBr (TRINTELLIX) 20 MG TABS    Sig: Take 20 mg by mouth daily.    Dispense:  30 tablet    Refill:  3  . DISCONTD: LORazepam (ATIVAN) 0.5 MG tablet    Sig: Take 1 tablet (  0.5 mg total) by mouth 2 (two) times daily as needed for anxiety.    Dispense:  30 tablet    Refill:  1  . LORazepam (ATIVAN) 0.5 MG tablet    Sig: Take 1 tablet (0.5 mg total) by mouth 2 (two) times daily as needed for anxiety.    Dispense:  20 tablet    Refill:  0     Tommi Rumps, MD [place]

## 2017-12-19 NOTE — Patient Instructions (Addendum)
Nice to see you. We will increase her trintellix dose. Please contact Lynann Beaver to see if he can do therapy for you. [phone] If you develop thoughts of harming yourself or others please seek medical attention immediately. I have provided you with a small supply of Ativan to take for anxiety.  If you develop drowsiness with this please let us know.  Please do not take this with your pain medication.

## 2017-12-20 ENCOUNTER — Telehealth: Payer: Self-pay | Admitting: Family Medicine

## 2017-12-20 NOTE — Telephone Encounter (Signed)
Copied from [place] [id number]. Topic: General - Other >> Dec 20, 2017  1:12 PM Lolita Rieger, [place] wrote: Reason for CRM: pt called and asked if her lorazepam 0.5 mg can be increased Please contact pt @[phone]  or send to pt pharmacy [facility] on [address]

## 2017-12-20 NOTE — Telephone Encounter (Signed)
Please advise 

## 2017-12-20 NOTE — Telephone Encounter (Signed)
Please confirm that she was previously taking 1 mg twice daily as needed.  If this is correct we could increase to that and I can send in a new prescription.

## 2017-12-21 ENCOUNTER — Ambulatory Visit: Payer: BLUE CROSS/BLUE SHIELD | Admitting: Family Medicine

## 2017-12-21 MED ORDER — LORAZEPAM 1 MG PO TABS: 1.0000 mg | ORAL_TABLET | Freq: Two times a day (BID) | ORAL | 1 refills | Status: DC | PRN

## 2017-12-21 NOTE — Telephone Encounter (Signed)
Noted.  Please fax.

## 2017-12-21 NOTE — Telephone Encounter (Signed)
Yes she previously took 1 mg twice daily as needed. [facility]

## 2017-12-21 NOTE — Telephone Encounter (Signed)
faxed

## 2017-12-26 ENCOUNTER — Ambulatory Visit: Payer: BLUE CROSS/BLUE SHIELD | Admitting: Family Medicine

## 2017-12-27 ENCOUNTER — Telehealth: Payer: Self-pay | Admitting: Family Medicine

## 2017-12-27 MED ORDER — VORTIOXETINE HBR 5 MG PO TABS: 15.0000 mg | ORAL_TABLET | Freq: Every day | ORAL | 3 refills | Status: DC

## 2017-12-27 NOTE — Telephone Encounter (Signed)
Patient states when she saw Dr.Kapur in the past she was increased to 20 mg and had some nausea. She was prescribed the 20 mg by Dr.Sonenberg recently and she forgot to mention she did have nausea in the past when it was at 20 mg. Patient would like to try 15 mg, she states she has 10 mg so would like to have 5 mg sent in so she can take 15 mg daily.

## 2017-12-27 NOTE — Telephone Encounter (Signed)
New prescription for 5 mg tablets sent to pharmacy.  Instructions are to take three 5 mg tablets daily.

## 2017-12-27 NOTE — Telephone Encounter (Signed)
Copied from [place] [id number]. Topic: General - Other >> Dec 27, 2017  1:22 PM Darl Householder, RMA wrote: Reason for CRM: patient is requesting a callback from [place] concerning medication vortioxetine HBr (TRINTELLIX) 20 MG pt states dosage is to strong for her and is asking for a lower dose, please return call

## 2017-12-27 NOTE — Telephone Encounter (Signed)
Left message to notify patient.

## 2018-01-28 ENCOUNTER — Ambulatory Visit: Payer: BLUE CROSS/BLUE SHIELD | Admitting: Pain Medicine

## 2018-01-30 ENCOUNTER — Other Ambulatory Visit: Payer: Self-pay | Admitting: Family Medicine

## 2018-01-31 NOTE — Telephone Encounter (Signed)
Last OV 12/19/17 last filled lorazepam 12/21/17 30 1rf

## 2018-02-03 ENCOUNTER — Other Ambulatory Visit: Payer: Self-pay

## 2018-02-03 ENCOUNTER — Emergency Department: Payer: BLUE CROSS/BLUE SHIELD

## 2018-02-03 ENCOUNTER — Emergency Department
Admission: EM | Admit: 2018-02-03 | Discharge: 2018-02-03 | Disposition: A | Discharge status: Home / Self Care | Payer: BLUE CROSS/BLUE SHIELD | Attending: Emergency Medicine | Admitting: Emergency Medicine

## 2018-02-03 DIAGNOSIS — Z79899 Other long term (current) drug therapy: Secondary | ICD-10-CM | POA: Insufficient documentation

## 2018-02-03 DIAGNOSIS — S61011A Laceration without foreign body of right thumb without damage to nail, initial encounter: Secondary | ICD-10-CM | POA: Diagnosis not present

## 2018-02-03 DIAGNOSIS — Y939 Activity, unspecified: Secondary | ICD-10-CM | POA: Diagnosis not present

## 2018-02-03 DIAGNOSIS — Z87891 Personal history of nicotine dependence: Secondary | ICD-10-CM | POA: Diagnosis not present

## 2018-02-03 DIAGNOSIS — Y929 Unspecified place or not applicable: Secondary | ICD-10-CM | POA: Diagnosis not present

## 2018-02-03 DIAGNOSIS — W25XXXA Contact with sharp glass, initial encounter: Secondary | ICD-10-CM | POA: Insufficient documentation

## 2018-02-03 DIAGNOSIS — S60931A Unspecified superficial injury of right thumb, initial encounter: Secondary | ICD-10-CM | POA: Diagnosis present

## 2018-02-03 DIAGNOSIS — Y999 Unspecified external cause status: Secondary | ICD-10-CM | POA: Insufficient documentation

## 2018-02-03 MED ORDER — IBUPROFEN 800 MG PO TABS
800.0000 mg | ORAL_TABLET | Freq: Once | ORAL | Status: AC
  Administered 2018-02-03: 800 mg via ORAL
  Filled 2018-02-03: qty 1

## 2018-02-03 MED ORDER — LIDOCAINE HCL (PF) 1 % IJ SOLN
5.0000 mL | Freq: Once | INTRAMUSCULAR | Status: AC
  Administered 2018-02-03: 5 mL
  Filled 2018-02-03: qty 5

## 2018-02-03 NOTE — Discharge Instructions (Signed)
Your xray was negative. You received 3 stitches today and Ibuprofen for pain. Keep the area clean and dry. You can wash with soap and warm water but do not scrub vigorously. Follow up with PCP in 1 week for suture removal.

## 2018-02-03 NOTE — ED Provider Notes (Signed)
[facility] Emergency Department Provider Note ____________________________________________  Time seen: 1640  I have reviewed the triage vital signs and the nursing notes.  HISTORY  Chief Complaint  Laceration   HPI Nicole Walter is a 57 y.o. female who presents to the ER with c/o laceration to right thumb. She reports 1 hour ago, she dropped a bag of groceries. Inside the bag was a jar of applesauce, which shattered. As she was cleaning things up, she sliced the base of her right thumb with a piece of glass. She cleansed the wound with cold water, and was able to easily get the bleeding to stop. She reports pain of 2/10. She describes the pain as throbbing. She has not tried anything additional OTC. Tetanus booster UTD.  Past Medical History:  Diagnosis Date  . Abnormal liver enzymes 02/08/2016  . Allergy   . Anxiety   . Chest pain 07/05/2016  . Chronic pain   . Depression   . DJD (degenerative joint disease)    lumbar  . Fibromyalgia   . High cholesterol   . Rash of back 01/28/2016  . Weight loss due to medication     Patient Active Problem List   Diagnosis Date Noted  . Chronic pelvic pain in female 08/24/2017  . Fatty liver 08/24/2017  . Chronic low back pain (Bilateral) (L>R) 08/13/2017  . Cervical Foraminal Stenosis (Severe) (Bilateral: C-5-6) 08/13/2017  . Cervical spondylosis with radiculopathy (Bilateral) (L>R) 08/13/2017  . Cervical sensory radiculopathy at C5 (Bilateral) 08/13/2017  . Radicular pain of shoulder (Bilateral) (C5) 08/13/2017  . Constipation 07/16/2017  . Myofascial pain syndrome, cervical (trapezius) (Left) 05/28/2017  . Cervical facet syndrome (Bilateral) (L>R) 05/28/2017  . Muscle spasm 05/15/2017  . Chronic right hip pain 03/19/2017  . Chronic knee arthropathy (Right) 03/14/2017  . Osteoarthritis of knee (Right) 02/21/2017  . Osteoarthritis of shoulder (Right) 01/31/2017  . Shortness of breath 01/15/2017  . Thoracic  spondylosis 01/01/2017  . Thoracic radiculitis (R>L) 12/21/2016  . Osteoarthritis of hip (Bilateral) (L>R) 11/20/2016  . Greater trochanteric bursitis (Right) 09/18/2016  . Subacromial bursitis of shoulder joint (Right) 09/18/2016  . Overweight (BMI 25.0-29.9) 09/14/2016  . Elevated sedimentation rate 08/31/2016  . Elevated C-reactive protein (CRP) 08/31/2016  . Long term current use of opiate analgesic 07/20/2016  . Long term prescription opiate use 07/20/2016  . Opiate use 07/20/2016  . Chronic upper back pain (Secondary source of pain) (Bilateral) (L>R) 07/20/2016  . Chronic shoulder pain [facility] source of pain) (Bilateral) (L>R) 07/20/2016  . Chronic knee pain (Right) 07/20/2016  . Chronic upper extremity pain (Left) 07/20/2016  . Chronic lower extremity pain (Bilateral) (L>R) 07/20/2016  . Chronic abdominal pain 07/20/2016  . Chronic hip pain (Bilateral) (L>R) 05/31/2016  . Chronic Greater trochanteric bursitis (Bilateral) (L>R) 05/31/2016  . Bursitis of right shoulder 05/17/2016  . Skin lesions 04/27/2016  . Hyperlipidemia 02/18/2016  . Cervical spondylosis (Bilateral) 02/08/2016  . Fibromyalgia 02/08/2016  . Osteopenia 02/08/2016  . Thoracic back pain 01/28/2016  . Chronic neck pain (Primary Source of Pain) (Bilateral) (L>R) 01/28/2016  . Concentration deficit 01/28/2016  . Encounter for general adult medical examination with abnormal findings 01/14/2016  . Lumbar spondylosis 01/06/2016  . Anxiety and depression 07/11/2015  . Chronic pain syndrome 07/11/2015  . Combined fat and carbohydrate induced hyperlipemia 07/11/2015    Past Surgical History:  Procedure Laterality Date  . ANKLE SURGERY     x 2  . BREAST BIOPSY Left 1997  . CHOLECYSTECTOMY    .  COLONOSCOPY    . COLONOSCOPY WITH PROPOFOL N/A 02/22/2016   Procedure: COLONOSCOPY WITH PROPOFOL;  Surgeon: Lollie Sails, MD;  Location: [facility] ENDOSCOPY;  Service: Endoscopy;  Laterality: N/A;  .  ESOPHAGOGASTRODUODENOSCOPY (EGD) WITH PROPOFOL N/A 02/22/2016   Procedure: ESOPHAGOGASTRODUODENOSCOPY (EGD) WITH PROPOFOL;  Surgeon: Lollie Sails, MD;  Location: [facility] ENDOSCOPY;  Service: Endoscopy;  Laterality: N/A;  . NASAL SINUS SURGERY    . TONSILLECTOMY    . TUBAL LIGATION      Prior to Admission medications   Medication Sig Start Date End Date Taking? Authorizing Provider  Calcium Carbonate-Vit D-Min (CALCIUM 1200 PO) Take by mouth.    [provider]  Cholecalciferol (VITAMIN D) 2000 units tablet Take 4,000 Units by mouth daily.    [provider]  fluocinonide ointment (LIDEX) 0.05 % APPLY TOPICALLY TWICE A DAY TO ITCHY AREAS ONLY. as needed 05/08/16   [provider]  fluticasone Asencion Islam) 50 MCG/ACT nasal spray instill 2 sprays into each nostril once daily 02/12/17   Leone Haven, MD  linaclotide [facility]) 145 MCG CAPS capsule Take 145 mcg by mouth daily before breakfast.    [provider]  LORazepam (ATIVAN) 1 MG tablet Take 1 tablet (1 mg total) by mouth 2 (two) times daily as needed for anxiety. Do not drink alcohol while taking this medication. 01/31/18   Leone Haven, MD  Multiple Vitamin (MULTI-VITAMINS) TABS Take 1 tablet by mouth daily.     [provider]  oxyCODONE (OXY IR/ROXICODONE) 5 MG immediate release tablet Take 1 tablet (5 mg total) by mouth every 6 (six) hours as needed for severe pain. 02/07/18 03/09/18  Vevelyn Francois, NP  oxyCODONE (OXY IR/ROXICODONE) 5 MG immediate release tablet Take 1 tablet (5 mg total) by mouth every 6 (six) hours as needed for severe pain. 01/08/18 02/07/18  Vevelyn Francois, NP  RABEprazole (ACIPHEX) 20 MG tablet Take 20 mg by mouth as needed.  06/22/16   [provider]  sucralfate (CARAFATE) 1 g tablet take 1 tablet by mouth three times a day if needed for abdominal pain 06/22/16   [provider]  vortioxetine HBr (TRINTELLIX) 5 MG TABS tablet Take 3 tablets (15 mg total) by  mouth daily. 12/27/17   Leone Haven, MD    Allergies Other and Food  Family History  Problem Relation Age of Onset  . Heart disease Mother   . Stroke Mother   . Cancer Father        lung  . Arthritis Father   . Arthritis Paternal Grandmother   . Arthritis Paternal Grandfather   . Drug abuse Sister   . Drug abuse Brother   . Post-traumatic stress disorder Brother   . Diabetes Sister   . Dementia Sister   . Drug abuse Brother   . Cancer Brother     Social History Social History   Tobacco Use  . Smoking status: Former Smoker    Packs/day: 1.00    Years: 35.00    Pack years: 35.00    Start date: 03/06/1974    Last attempt to quit: 07/08/2015    Years since quitting: 2.5  . Smokeless tobacco: Never Used  . Tobacco comment: vapor cigarettes, no nicotene  Substance Use Topics  . Alcohol use: No    Alcohol/week: 0.0 oz  . Drug use: No    Review of Systems  Constitutional: Negative for fever, chills, body aches. Skin: Pt reports laceration to right thumb. Musculoskeletal: Negative  for joint pain, swelling, redness, decrease ROM Neurological: Negative for focal weakness or numbness. ____________________________________________  PHYSICAL EXAM:  VITAL SIGNS: ED Triage Vitals  Enc Vitals Group     BP 02/03/18 1825 122/87     Pulse Rate 02/03/18 1825 67     Resp 02/03/18 1825 18     Temp 02/03/18 1825 98.3 F (36.8 C)     Temp Source 02/03/18 1825 Oral     SpO2 02/03/18 1825 99 %     Weight 02/03/18 1826 140 lb (63.5 kg)     Height 02/03/18 1826 5\' 4"  (1.626 m)     Head Circumference --      Peak Flow --      Pain Score 02/03/18 1826 5     Pain Loc --      Pain Edu? --      Excl. in [place]? --     Constitutional: Alert and oriented. Well appearing and in no distress. Cardiovascular: Radial pulse 2+ on the right, cap refill < 3 secs. Musculoskeletal: Normal flexion and extension of the right thumb. Normal resistance to flexion of the right thumb.   Neurologic:  Sensation intact to right thumb. Skin:  1 cm linear laceration to base of right thumb  ____________________________________________   RADIOLOGY  Right Thumb:  IMPRESSION: No radiopaque foreign body.  No acute osseous abnormality.  ____________________________________________  PROCEDURES  .Marland KitchenLaceration Repair Date/Time: 02/03/2018 7:27 PM Performed by: Jearld Fenton, NP Authorized by: Jearld Fenton, NP   Consent:    Consent obtained:  Verbal   Consent given by:  Patient   Risks discussed:  Infection, pain and poor wound healing Anesthesia (see MAR for exact dosages):    Anesthesia method:  Local infiltration   Local anesthetic:  Lidocaine 1% w/o epi Laceration details:    Location:  Finger   Length (cm):  1 Repair type:    Repair type:  Simple Pre-procedure details:    Preparation:  Patient was prepped and draped in usual sterile fashion Exploration:    Contaminated: no   Treatment:    Area cleansed with:  Betadine   Amount of cleaning:  Standard   Irrigation solution:  Sterile saline   Irrigation volume:  5 ml   Irrigation method:  Syringe   Visualized foreign bodies/material removed: no   Skin repair:    Repair method:  Sutures   Suture size:  4-0   Suture material:  Nylon   Suture technique:  Simple interrupted   Number of sutures:  3 Approximation:    Approximation:  Close Post-procedure details:    Dressing:  Open (no dressing)   Patient tolerance of procedure:  Tolerated well, no immediate complications   ____________________________________________  INITIAL IMPRESSION / ASSESSMENT AND PLAN / ED COURSE    57 yo female with laceration to base of right thumb. No foreign body noted. Xray negative. Area cleansed and sutured. No indication for prophylactic abx. Return precautions discussed.  ____________________________________________  FINAL CLINICAL IMPRESSION(S) / ED DIAGNOSES  Final diagnoses:  Laceration of right thumb without  foreign body without damage to nail, initial encounter      Jearld Fenton, NP 02/03/18 [place], [place], MD 02/06/18 [phone]

## 2018-02-03 NOTE — ED Triage Notes (Signed)
Pt states that she had broken a jar of applesauce earlier and a piece of glass was lodged in a potato she grabbed, pt has a lac to her webbing at the base of her right thumb. Pt states that it felt like it went deep

## 2018-02-04 ENCOUNTER — Telehealth: Payer: Self-pay | Admitting: Family Medicine

## 2018-02-04 NOTE — Telephone Encounter (Signed)
Can I schedule this on 02/12/18 at 9 am?

## 2018-02-04 NOTE — Telephone Encounter (Signed)
Copied from [place] [id number]. Topic: Appointment Scheduling - Scheduling Inquiry for Clinic >> Feb 04, 2018  1:58 PM Synthia Innocent wrote: Reason for CRM: Patient is scheduled for 02/15/18 with Dr Josephina Gip, requesting to come in on 02/12/18 due to having stitches in her thumb and needing them removed. Please advise

## 2018-02-11 ENCOUNTER — Ambulatory Visit
Admission: RE | Admit: 2018-02-11 | Discharge: 2018-02-11 | Disposition: A | Discharge status: Home / Self Care | Payer: BLUE CROSS/BLUE SHIELD | Source: Ambulatory Visit | Attending: Pain Medicine | Admitting: Pain Medicine

## 2018-02-11 ENCOUNTER — Encounter: Payer: Self-pay | Admitting: Pain Medicine

## 2018-02-11 ENCOUNTER — Ambulatory Visit: Payer: BLUE CROSS/BLUE SHIELD | Attending: Pain Medicine | Admitting: Pain Medicine

## 2018-02-11 ENCOUNTER — Other Ambulatory Visit: Payer: Self-pay

## 2018-02-11 VITALS — BP 140/81 | HR 62 | Temp 98.3°F | Ht 64.0 in | Wt 149.0 lb

## 2018-02-11 DIAGNOSIS — M25512 Pain in left shoulder: Secondary | ICD-10-CM | POA: Insufficient documentation

## 2018-02-11 DIAGNOSIS — M797 Fibromyalgia: Secondary | ICD-10-CM | POA: Insufficient documentation

## 2018-02-11 DIAGNOSIS — F419 Anxiety disorder, unspecified: Secondary | ICD-10-CM | POA: Diagnosis not present

## 2018-02-11 DIAGNOSIS — R102 Pelvic and perineal pain: Secondary | ICD-10-CM | POA: Insufficient documentation

## 2018-02-11 DIAGNOSIS — E782 Mixed hyperlipidemia: Secondary | ICD-10-CM | POA: Insufficient documentation

## 2018-02-11 DIAGNOSIS — F329 Major depressive disorder, single episode, unspecified: Secondary | ICD-10-CM | POA: Diagnosis not present

## 2018-02-11 DIAGNOSIS — M25561 Pain in right knee: Secondary | ICD-10-CM | POA: Insufficient documentation

## 2018-02-11 DIAGNOSIS — E663 Overweight: Secondary | ICD-10-CM | POA: Insufficient documentation

## 2018-02-11 DIAGNOSIS — R109 Unspecified abdominal pain: Secondary | ICD-10-CM | POA: Insufficient documentation

## 2018-02-11 DIAGNOSIS — G8929 Other chronic pain: Secondary | ICD-10-CM | POA: Insufficient documentation

## 2018-02-11 DIAGNOSIS — M47812 Spondylosis without myelopathy or radiculopathy, cervical region: Secondary | ICD-10-CM | POA: Diagnosis not present

## 2018-02-11 DIAGNOSIS — M4724 Other spondylosis with radiculopathy, thoracic region: Secondary | ICD-10-CM | POA: Diagnosis not present

## 2018-02-11 DIAGNOSIS — M4722 Other spondylosis with radiculopathy, cervical region: Secondary | ICD-10-CM | POA: Diagnosis not present

## 2018-02-11 DIAGNOSIS — M160 Bilateral primary osteoarthritis of hip: Secondary | ICD-10-CM | POA: Diagnosis not present

## 2018-02-11 DIAGNOSIS — G894 Chronic pain syndrome: Secondary | ICD-10-CM | POA: Diagnosis not present

## 2018-02-11 DIAGNOSIS — Z87891 Personal history of nicotine dependence: Secondary | ICD-10-CM | POA: Insufficient documentation

## 2018-02-11 DIAGNOSIS — K760 Fatty (change of) liver, not elsewhere classified: Secondary | ICD-10-CM | POA: Diagnosis not present

## 2018-02-11 DIAGNOSIS — M546 Pain in thoracic spine: Secondary | ICD-10-CM | POA: Diagnosis not present

## 2018-02-11 DIAGNOSIS — Z6825 Body mass index (BMI) 25.0-25.9, adult: Secondary | ICD-10-CM | POA: Insufficient documentation

## 2018-02-11 DIAGNOSIS — R700 Elevated erythrocyte sedimentation rate: Secondary | ICD-10-CM | POA: Insufficient documentation

## 2018-02-11 DIAGNOSIS — Z79891 Long term (current) use of opiate analgesic: Secondary | ICD-10-CM | POA: Insufficient documentation

## 2018-02-11 DIAGNOSIS — M542 Cervicalgia: Secondary | ICD-10-CM | POA: Diagnosis not present

## 2018-02-11 DIAGNOSIS — Z7951 Long term (current) use of inhaled steroids: Secondary | ICD-10-CM | POA: Diagnosis not present

## 2018-02-11 DIAGNOSIS — Z79899 Other long term (current) drug therapy: Secondary | ICD-10-CM | POA: Diagnosis not present

## 2018-02-11 DIAGNOSIS — Z8249 Family history of ischemic heart disease and other diseases of the circulatory system: Secondary | ICD-10-CM | POA: Insufficient documentation

## 2018-02-11 DIAGNOSIS — K5900 Constipation, unspecified: Secondary | ICD-10-CM | POA: Diagnosis not present

## 2018-02-11 DIAGNOSIS — R7982 Elevated C-reactive protein (CRP): Secondary | ICD-10-CM | POA: Insufficient documentation

## 2018-02-11 DIAGNOSIS — M4802 Spinal stenosis, cervical region: Secondary | ICD-10-CM | POA: Insufficient documentation

## 2018-02-11 DIAGNOSIS — Z91018 Allergy to other foods: Secondary | ICD-10-CM | POA: Insufficient documentation

## 2018-02-11 DIAGNOSIS — Z833 Family history of diabetes mellitus: Secondary | ICD-10-CM | POA: Insufficient documentation

## 2018-02-11 DIAGNOSIS — Z823 Family history of stroke: Secondary | ICD-10-CM | POA: Insufficient documentation

## 2018-02-11 DIAGNOSIS — M19011 Primary osteoarthritis, right shoulder: Secondary | ICD-10-CM | POA: Insufficient documentation

## 2018-02-11 DIAGNOSIS — Z9049 Acquired absence of other specified parts of digestive tract: Secondary | ICD-10-CM | POA: Insufficient documentation

## 2018-02-11 DIAGNOSIS — Z8261 Family history of arthritis: Secondary | ICD-10-CM | POA: Insufficient documentation

## 2018-02-11 DIAGNOSIS — M25511 Pain in right shoulder: Secondary | ICD-10-CM

## 2018-02-11 DIAGNOSIS — M8580 Other specified disorders of bone density and structure, unspecified site: Secondary | ICD-10-CM | POA: Diagnosis not present

## 2018-02-11 NOTE — Progress Notes (Signed)
Nursing Pain Medication Assessment:  Safety precautions to be maintained throughout the outpatient stay will include: orient to surroundings, keep bed in low position, maintain call bell within reach at all times, provide assistance with transfer out of bed and ambulation.  Medication Inspection Compliance: Pill count conducted under aseptic conditions, in front of the patient. Neither the pills nor the bottle was removed from the patient's sight at any time. Once count was completed pills were immediately returned to the patient in their original bottle.  Medication: Oxycodone IR Pill/Patch Count: 109 of 120 pills remain Pill/Patch Appearance: Markings consistent with prescribed medication Bottle Appearance: Standard pharmacy container. Clearly labeled. Filled Date: 04 / 04 / 2019 Last Medication intake:  Today

## 2018-02-11 NOTE — Patient Instructions (Signed)
____________________________________________________________________________________________  Preparing for Procedure with Sedation  Instructions: . Oral Intake: Do not eat or drink anything for at least 8 hours prior to your procedure. . Transportation: Public transportation is not allowed. Bring an adult driver. The driver must be physically present in our waiting room before any procedure can be started. . Physical Assistance: Bring an adult physically capable of assisting you, in the event you need help. This adult should keep you company at home for at least 6 hours after the procedure. . Blood Pressure Medicine: Take your blood pressure medicine with a sip of water the morning of the procedure. . Blood thinners:  . Diabetics on insulin: Notify the staff so that you can be scheduled 1st case in the morning. If your diabetes requires high dose insulin, take only  of your normal insulin dose the morning of the procedure and notify the staff that you have done so. . Preventing infections: Shower with an antibacterial soap the morning of your procedure. . Build-up your immune system: Take 1000 mg of Vitamin C with every meal (3 times a day) the day prior to your procedure. . Antibiotics: Inform the staff if you have a condition or reason that requires you to take antibiotics before dental procedures. . Pregnancy: If you are pregnant, call and cancel the procedure. . Sickness: If you have a cold, fever, or any active infections, call and cancel the procedure. . Arrival: You must be in the facility at least 30 minutes prior to your scheduled procedure. . Children: Do not bring children with you. . Dress appropriately: Bring dark clothing that you would not mind if they get stained. . Valuables: Do not bring any jewelry or valuables.  Procedure appointments are reserved for interventional treatments only. . No Prescription Refills. . No medication changes will be discussed during procedure  appointments. . No disability issues will be discussed.  Remember:  Regular Business hours are:  Monday to Thursday 8:00 AM to 4:00 PM  Provider's Schedule: Jawana Reagor, MD:  Procedure days: Tuesday and Thursday 7:30 AM to 4:00 PM  Bilal [name], MD:  Procedure days: Monday and Wednesday 7:30 AM to 4:00 PM ____________________________________________________________________________________________    

## 2018-02-11 NOTE — Progress Notes (Signed)
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 immediate release tablet, Take 1 tablet (5 mg total) by mouth every 6 (six) hours as needed for severe pain., Disp: 120 tablet, Rfl: 0  ROS  Constitutional: Denies any fever or chills Gastrointestinal: No reported hemesis, hematochezia, vomiting, or acute GI distress Musculoskeletal: Denies any acute onset joint swelling, redness, loss of ROM, or weakness Neurological: No reported episodes of acute onset apraxia, aphasia, dysarthria, agnosia, amnesia, paralysis, loss of coordination, or loss of consciousness  Allergies  Ms. Flett is allergic to other and food.  PFSH  Drug: Ms. Yearwood  reports that she does not use drugs. Alcohol:  reports that she does not drink alcohol. Tobacco:   reports that  she quit smoking about 2 years ago. She started smoking about 43 years ago. She has a 35.00 pack-year smoking history. She has never used smokeless tobacco. Medical:  has a past medical history of Abnormal liver enzymes (02/08/2016), Allergy, Anxiety, Chest pain (07/05/2016), Chronic pain, Depression, DJD (degenerative joint disease), Fibromyalgia, High cholesterol, Rash of back (01/28/2016), and Weight loss due to medication. Surgical: Ms. Epling  has a past surgical history that includes Cholecystectomy; Tubal ligation; Ankle surgery; Nasal sinus surgery; Tonsillectomy; Colonoscopy; Esophagogastroduodenoscopy (egd) with propofol (N/A, 02/22/2016); Colonoscopy with propofol (N/A, 02/22/2016); and Breast biopsy (Left, 1997). Family: family history includes Arthritis in her father, paternal grandfather, and paternal grandmother; Cancer in her brother and father; Dementia in her sister; Diabetes in her sister; Drug abuse in her brother, brother, and sister; Heart disease in her mother; Post-traumatic stress disorder in her brother; Stroke in her mother.  Constitutional Exam  General appearance: Well nourished, well developed, and well hydrated. In no apparent acute distress Vitals:   02/11/18 1006  BP: 140/81  Pulse: 62  Temp: 98.3 F (36.8 C)  SpO2: 100%  Weight: 149 lb (67.6 kg)  Height: '5\' 4"'  (1.626 m)   BMI Assessment: Estimated body mass index is 25.58 kg/m as calculated from the following:   Height as of this encounter: '5\' 4"'  (1.626 m).   Weight as of this encounter: 149 lb (67.6 kg).  BMI interpretation table: BMI level Category Range association with higher incidence of chronic pain  <18 kg/m2 Underweight   18.5-24.9 kg/m2 Ideal body weight   25-29.9 kg/m2 Overweight Increased incidence by 20%  30-34.9 kg/m2 Obese (Class I) Increased incidence by 68%  35-39.9 kg/m2 Severe obesity (Class II) Increased incidence by 136%  >40 kg/m2 Extreme obesity (Class III) Increased incidence  by 254%   BMI Readings from Last 4 Encounters:  02/11/18 25.58 kg/m  02/03/18 24.03 kg/m  12/19/17 24.79 kg/m  12/03/17 24.89 kg/m   Wt Readings from Last 4 Encounters:  02/11/18 149 lb (67.6 kg)  02/03/18 140 lb (63.5 kg)  12/19/17 144 lb 6.4 oz (65.5 kg)  12/03/17 145 lb (65.8 kg)  Psych/Mental status: Alert, oriented x 3 (person, place, & time)       Eyes: PERLA Respiratory: No evidence of acute respiratory distress  Cervical Spine Area Exam  Skin & Axial Inspection: No masses, redness, edema, swelling, or associated skin lesions Alignment: Symmetrical Functional ROM: Decreased ROM      Stability: No instability detected Muscle Tone/Strength: Functionally intact. No obvious neuro-muscular anomalies detected. Sensory (Neurological): Movement-associated pain Palpation: Complains of area being tender to palpation Positive provocative maneuver for for cervical facet disease  Upper Extremity (UE) Exam    Side: Right upper extremity  Side: Left upper extremity  Skin & Extremity Inspection: Skin color, temperature, and hair growth are WNL. No peripheral edema or cyanosis. No masses, redness, swelling, asymmetry, or associated skin lesions. No contractures.  Skin & Extremity Inspection: Skin color, temperature, and hair growth are WNL. No peripheral edema or cyanosis. No masses, redness, swelling, asymmetry, or associated skin lesions. No contractures.  Functional ROM: Decreased ROM for shoulder  Functional ROM: Decreased ROM for shoulder  Muscle Tone/Strength: Guarding  Muscle Tone/Strength: TEFL teacher (Neurological): Movement-associated pain          Sensory (Neurological): Movement-associated discomfort          Palpation: Complains of area being tender to palpation              Palpation:  Tender              Specialized Test(s): Deferred         Specialized Test(s): Deferred          Thoracic Spine Area Exam  Skin & Axial Inspection: No masses, redness, or  swelling Alignment: Symmetrical Functional ROM: Unrestricted ROM Stability: No instability detected Muscle Tone/Strength: Functionally intact. No obvious neuro-muscular anomalies detected. Sensory (Neurological): Unimpaired Muscle strength & Tone: No palpable anomalies  Lumbar Spine Area Exam  Skin & Axial Inspection: No masses, redness, or swelling Alignment: Symmetrical Functional ROM: Unrestricted ROM      Stability: No instability detected Muscle Tone/Strength: Functionally intact. No obvious neuro-muscular anomalies detected. Sensory (Neurological): Unimpaired Palpation: No palpable anomalies       Provocative Tests: Lumbar Hyperextension and rotation test: evaluation deferred today       Lumbar Lateral bending test: evaluation deferred today       Patrick's Maneuver: evaluation deferred today                    Gait & Posture Assessment  Ambulation: Unassisted Gait: Relatively normal for age and body habitus Posture: WNL   Lower Extremity Exam    Side: Right lower extremity  Side: Left lower extremity  Skin & Extremity Inspection: Skin color, temperature, and hair growth are WNL. No peripheral edema or cyanosis. No masses, redness, swelling, asymmetry, or associated skin lesions. No contractures.  Skin & Extremity Inspection: Skin color, temperature, and hair growth are WNL. No peripheral edema or cyanosis. No masses, redness, swelling, asymmetry, or associated skin lesions. No contractures.  Functional ROM: Unrestricted ROM          Functional ROM: Unrestricted ROM          Muscle Tone/Strength: Functionally intact. No obvious neuro-muscular anomalies detected.  Muscle Tone/Strength: Functionally intact. No obvious neuro-muscular anomalies detected.  Sensory (Neurological): Unimpaired  Sensory (Neurological): Unimpaired  Palpation: No palpable anomalies  Palpation: No palpable anomalies   Assessment  Primary Diagnosis & Pertinent Problem List: The primary encounter  diagnosis was Cervical facet syndrome (Bilateral) (L>R). Diagnoses of Chronic neck pain (Primary Source of Pain) (Bilateral) (L>R), Spondylosis without myelopathy or radiculopathy, cervical region, Chronic knee pain (Right), and Chronic shoulder pain (Tertiary source of pain) (Bilateral) (L>R) were also pertinent to this visit.  Status Diagnosis  Controlled Controlled Controlled 1. Cervical facet syndrome (Bilateral) (L>R)   2. Chronic neck pain (Primary Source of Pain) (Bilateral) (L>R)   3. Spondylosis without myelopathy or radiculopathy, cervical region   4. Chronic knee pain (Right)   5. Chronic shoulder pain (Tertiary source of pain) (Bilateral) (L>R)     Problems updated and reviewed during this visit: No problems updated. Plan of Care  Pharmacotherapy (Medications Ordered): No orders of the defined types were placed in this encounter.  Medications administered today: Lilia Letterman had no medications administered during this visit.   Procedure Orders     Radiofrequency,Cervical Lab Orders  No laboratory test(s) ordered today    Imaging Orders     DG Knee 1-2 Views Right     DG Shoulder Left Referral Orders  No referral(s) requested today    Interventional management options: Planned, scheduled, and/or pending:   Therapeutic left-sided cervical facet RFA under fluoroscopic guidance and IV sedation   Considering:   Diagnostic bilateral cervical facet block #3 Possible bilateral cervical facet radiofrequencyablation Diagnostic left-sided cervical epidural steroid injection  Diagnostic bilateral intra-articular shoulder joint injection Diagnostic bilateral suprascapular  nerve block Possible bilateral suprascapular nerve radiofrequencyablation Diagnostic bilateral lumbar facet block Possible bilateral lumbar facet radiofrequencyablation  Diagnostic bilateral intra-articular hip joint injection Diagnostic bilateral femoral and obturator tickler branch  blocks Possible bilateral hip joint radiofrequencyablation  Diagnosticright intra-articular knee joint injection Possible series of 5, right-sided, intra-articular knee joint injections with Hyalgan.  Possible right sided genicular nerve block Possible right sided genicular nerve radiofrequencyablation Possible left-sided lumbar epidural steroid injection   Palliative PRN treatment(s):   Palliativebilateral suprascapular nerve block Palliativeright intra-articular knee joint injection   Provider-requested follow-up: Return for RFA (fluoro + sedation): (L) C-FCT RF.  Future Appointments  Date Time Provider [place]  02/12/2018  9:00 AM Leone Haven, MD [facility]  02/18/2018  9:30 AM Vevelyn Francois, NP [facility] None  10/22/2018 10:00 AM Harlin Heys, MD [facility] None   Primary Care Physician: Leone Haven, MD Location: [facility] Note by: Gaspar Cola, MD Date: 02/11/2018; Time: 7:49 AM

## 2018-02-12 ENCOUNTER — Encounter: Payer: Self-pay | Admitting: Family Medicine

## 2018-02-12 ENCOUNTER — Ambulatory Visit (INDEPENDENT_AMBULATORY_CARE_PROVIDER_SITE_OTHER): Payer: BLUE CROSS/BLUE SHIELD | Admitting: Family Medicine

## 2018-02-12 DIAGNOSIS — F329 Major depressive disorder, single episode, unspecified: Secondary | ICD-10-CM

## 2018-02-12 DIAGNOSIS — S61411A Laceration without foreign body of right hand, initial encounter: Secondary | ICD-10-CM

## 2018-02-12 DIAGNOSIS — F419 Anxiety disorder, unspecified: Secondary | ICD-10-CM

## 2018-02-12 DIAGNOSIS — S61419A Laceration without foreign body of unspecified hand, initial encounter: Secondary | ICD-10-CM | POA: Insufficient documentation

## 2018-02-12 DIAGNOSIS — F32A Depression, unspecified: Secondary | ICD-10-CM

## 2018-02-12 DIAGNOSIS — E663 Overweight: Secondary | ICD-10-CM

## 2018-02-12 NOTE — Assessment & Plan Note (Signed)
Encouraged continued diet and exercise. 

## 2018-02-12 NOTE — Assessment & Plan Note (Signed)
Relatively stable.  No SI.  She thinks things will even out once her granddaughter gets back home with the patient's daughter.  She will continue her current regimen.  She was advised not to drink alcohol with the Lorazepam.  She will continue to see the therapist.

## 2018-02-12 NOTE — Patient Instructions (Signed)
Nice to see you. Please monitor your wound.  If you develop redness, drainage, or increased pain please be reevaluated. Please try to start exercising as you are able at home. Please continue to see the therapist and monitor your depression.  If you develop thoughts of harming yourself please go to the emergency room.  Please do not drink alcohol with the Lorazepam.

## 2018-02-12 NOTE — Assessment & Plan Note (Signed)
Status post suture repair.  She has done well.  It is well-healed.  2 sutures were removed as one had fallen out.  Benign exam.  She will monitor.  Given return precautions.
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
## 2018-02-14 ENCOUNTER — Telehealth: Payer: Self-pay | Admitting: Nurse Practitioner

## 2018-02-14 NOTE — Telephone Encounter (Signed)
She needs to contact the billing department. We do not do billing here. I believe the number is  [phone]

## 2018-02-14 NOTE — Telephone Encounter (Signed)
Patient is concerned about her billing. When she comes in for Med Mgmt with [facility] she has to pay outpatient bill of $260. Insurance says this is because it is billed as outpatient and not specialist, like when she comes to see Dr. Dossie Arbour. Wants to know if this can be fixed? Or if Dr. Dossie Arbour can take over her scripts writing. Patient would like a call back

## 2018-02-15 ENCOUNTER — Ambulatory Visit: Payer: BLUE CROSS/BLUE SHIELD | Admitting: Family Medicine

## 2018-02-18 ENCOUNTER — Other Ambulatory Visit: Payer: Self-pay

## 2018-02-18 ENCOUNTER — Ambulatory Visit: Payer: BLUE CROSS/BLUE SHIELD | Attending: Nurse Practitioner | Admitting: Nurse Practitioner

## 2018-02-18 ENCOUNTER — Encounter: Payer: Self-pay | Admitting: Nurse Practitioner

## 2018-02-18 VITALS — BP 135/81 | HR 69 | Temp 98.4°F | Resp 16 | Ht 64.0 in | Wt 147.0 lb

## 2018-02-18 DIAGNOSIS — Z7951 Long term (current) use of inhaled steroids: Secondary | ICD-10-CM | POA: Insufficient documentation

## 2018-02-18 DIAGNOSIS — M25512 Pain in left shoulder: Secondary | ICD-10-CM | POA: Diagnosis not present

## 2018-02-18 DIAGNOSIS — G8929 Other chronic pain: Secondary | ICD-10-CM | POA: Diagnosis not present

## 2018-02-18 DIAGNOSIS — Z9889 Other specified postprocedural states: Secondary | ICD-10-CM | POA: Insufficient documentation

## 2018-02-18 DIAGNOSIS — M47812 Spondylosis without myelopathy or radiculopathy, cervical region: Secondary | ICD-10-CM

## 2018-02-18 DIAGNOSIS — M25511 Pain in right shoulder: Secondary | ICD-10-CM | POA: Diagnosis not present

## 2018-02-18 DIAGNOSIS — M4722 Other spondylosis with radiculopathy, cervical region: Secondary | ICD-10-CM | POA: Insufficient documentation

## 2018-02-18 DIAGNOSIS — G894 Chronic pain syndrome: Secondary | ICD-10-CM | POA: Insufficient documentation

## 2018-02-18 DIAGNOSIS — Z87891 Personal history of nicotine dependence: Secondary | ICD-10-CM | POA: Diagnosis not present

## 2018-02-18 DIAGNOSIS — Z79899 Other long term (current) drug therapy: Secondary | ICD-10-CM | POA: Diagnosis not present

## 2018-02-18 DIAGNOSIS — Z79891 Long term (current) use of opiate analgesic: Secondary | ICD-10-CM | POA: Insufficient documentation

## 2018-02-18 DIAGNOSIS — Z9049 Acquired absence of other specified parts of digestive tract: Secondary | ICD-10-CM | POA: Diagnosis not present

## 2018-02-18 DIAGNOSIS — Z91018 Allergy to other foods: Secondary | ICD-10-CM | POA: Diagnosis not present

## 2018-02-18 DIAGNOSIS — M542 Cervicalgia: Secondary | ICD-10-CM

## 2018-02-18 DIAGNOSIS — M797 Fibromyalgia: Secondary | ICD-10-CM | POA: Insufficient documentation

## 2018-02-18 MED ORDER — OXYCODONE HCL 5 MG PO TABS: 5.0000 mg | ORAL_TABLET | Freq: Four times a day (QID) | ORAL | 0 refills | Status: DC | PRN

## 2018-02-18 MED ORDER — IBUPROFEN 800 MG PO TABS: 800.0000 mg | ORAL_TABLET | Freq: Three times a day (TID) | ORAL | 0 refills | Status: DC | PRN

## 2018-02-18 NOTE — Progress Notes (Signed)
Patient's Name: Nicole Walter  MRN: [id number]  Referring Provider: Leone Haven, MD  DOB: [birth year]  PCP: Leone Haven, MD  DOS: 02/18/2018  Note by: Vevelyn Francois NP  Service setting: Ambulatory outpatient  Specialty: Interventional Pain Management  Location: [facility] (AMB) Pain Management Facility    Patient type: Established    Primary Reason(s) for Visit: Encounter for prescription drug management. (Level of risk: moderate)  CC: Neck Pain  HPI  Nicole Walter is a 57 y.o. year old, female patient, who comes today for a medication management evaluation. She has Anxiety and depression; Chronic pain syndrome; Lumbar spondylosis; Combined fat and carbohydrate induced hyperlipemia; Encounter for general adult medical examination with abnormal findings; Thoracic back pain; Chronic neck pain (Primary Source of Pain) (Bilateral) (L>R); Concentration deficit; Hyperlipidemia; Cervical spondylosis (Bilateral); Fibromyalgia; Osteopenia; Skin lesions; Chronic hip pain (Bilateral) (L>R); Bursitis of right shoulder; Chronic Greater trochanteric bursitis (Bilateral) (L>R); Long term current use of opiate analgesic; Long term prescription opiate use; Opiate use; Chronic upper back pain (Secondary source of pain) (Bilateral) (L>R); Chronic shoulder pain [facility] source of pain) (Bilateral) (L>R); Chronic knee pain (Right); Chronic upper extremity pain (Left); Chronic lower extremity pain (Bilateral) (L>R); Chronic abdominal pain; Elevated sedimentation rate; Elevated C-reactive protein (CRP); Overweight (BMI 25.0-29.9); Greater trochanteric bursitis (Right); Subacromial bursitis of shoulder joint (Right); Osteoarthritis of hip (Bilateral) (L>R); Thoracic radiculitis (R>L); Thoracic spondylosis; Shortness of breath; Osteoarthritis of shoulder (Right); Osteoarthritis of knee (Right); Chronic knee arthropathy (Right); Chronic right hip pain; Muscle spasm; Myofascial pain syndrome, cervical (trapezius) (Left);  Cervical facet syndrome (Bilateral) (L>R); Constipation; Chronic low back pain (Bilateral) (L>R); Cervical Foraminal Stenosis (Severe) (Bilateral: C-5-6); Cervical spondylosis with radiculopathy (Bilateral) (L>R); Cervical sensory radiculopathy at C5 (Bilateral); Radicular pain of shoulder (Bilateral) (C5); Chronic pelvic pain in female; Fatty liver; Spondylosis without myelopathy or radiculopathy, cervical region; and Laceration of hand on their problem list. Her primarily concern today is the Neck Pain  Pain Assessment: Location:   Neck Radiating: both shoulders and arms, down to the hands Onset: More than a month ago Duration: Chronic pain Quality: Aching, Tingling, Numbness Severity: 1 /10 (self-reported pain score)  Note: Reported level is compatible with observation.                          Effect on ADL:   Timing: Constant Modifying factors: medications  [name] was last scheduled for an appointment on 02/14/2018 for medication management. During today's appointment we reviewed Nicole Walter's chronic pain status, as well as her outpatient medication regimen. She admits that she is having more hand pain. She is having numbness, tingling with weakness in her arms and hands. She admits that she is also having swelling in her hands. She had seen a neurosurgeon, orthopedist and had completed images along with EMG. She is scheduled to have a Cervical Facet RFA. She is requesting additional pain medication.   The patient  reports that she does not use drugs. Her body mass index is 25.23 kg/m.  Further details on both, my assessment(s), as well as the proposed treatment plan, please see below.  Controlled Substance Pharmacotherapy Assessment REMS (Risk Evaluation and Mitigation Strategy)  Analgesic:Oxycodone one tablet by mouth 4times a day (58m/dayof hydrocodone) MME/day:333mday     WhLandis MartinsRN  02/18/2018 10:04 AM  Sign at close encounter Nursing Pain Medication  Assessment:  Safety precautions to be maintained throughout the outpatient stay will include: orient to surroundings, keep  bed in low position, maintain call bell within reach at all times, provide assistance with transfer out of bed and ambulation.  Medication Inspection Compliance: Pill count conducted under aseptic conditions, in front of the patient. Neither the pills nor the bottle was removed from the patient's sight at any time. Once count was completed pills were immediately returned to the patient in their original bottle.  Medication: Oxycodone IR Pill/Patch Count: 70 of 120 pills remain Pill/Patch Appearance: Markings consistent with prescribed medication Bottle Appearance: Standard pharmacy container. Clearly labeled. Filled Date: 04/04/ 2019 Last Medication intake:  Today   Pharmacokinetics:  Liberation and absorption (onset of action): WNL Distribution (time to peak effect): WNL Metabolism and excretion (duration of action): WNL         Pharmacodynamics: Desired effects: Analgesia: Nicole Walter reports >50% benefit. Functional ability: Patient reports that medication allows her to accomplish basic ADLs Clinically meaningful improvement in function (CMIF): Sustained CMIF goals met Perceived effectiveness: Described as relatively effective, allowing for increase in activities of daily living (ADL) Undesirable effects: Side-effects or Adverse reactions: None reported Monitoring: [place] PMP: Online review of the past 52-monthperiod conducted. Compliant with practice rules and regulations Last UDS on record: Summary  Date Value Ref Range Status  12/03/2017 FINAL  Final    Comment:    ==================================================================== TOXASSURE SELECT 13 (MW) ==================================================================== Test                             Result       Flag       Units Drug Present   Oxycodone                      1486                     ng/mg creat   Oxymorphone                    468                     ng/mg creat   Noroxycodone                   2089                    ng/mg creat   Noroxymorphone                 252                     ng/mg creat    Sources of oxycodone are scheduled prescription medications.    Oxymorphone, noroxycodone, and noroxymorphone are expected    metabolites of oxycodone. Oxymorphone is also available as a    scheduled prescription medication. ==================================================================== Test                      Result    Flag   Units      Ref Range   Creatinine              157              mg/dL      >=20 ==================================================================== Declared Medications:  Medication list was not provided. ==================================================================== For clinical consultation, please call ([phone] ====================================================================    UDS interpretation: Compliant  Medication Assessment Form: Reviewed. Patient indicates being compliant with therapy Treatment compliance: Compliant Risk Assessment Profile: Aberrant behavior: See prior evaluations. None observed or detected today Comorbid factors increasing risk of overdose: See prior notes. No additional risks detected today Risk of substance use disorder (SUD): Low  ORT Scoring interpretation table:  Score <3 = Low Risk for SUD  Score between 4-7 = Moderate Risk for SUD  Score >8 = High Risk for Opioid Abuse   Risk Mitigation Strategies:  Patient Counseling: Covered Patient-Prescriber Agreement (PPA): Present and active  Notification to other healthcare providers: Done  Pharmacologic Plan: No change in therapy, at this time.             Laboratory Chemistry  Inflammation Markers (CRP: Acute Phase) (ESR: Chronic Phase) Lab Results  Component Value Date   CRP 2.1 (H) 07/20/2016   ESRSEDRATE 2 05/15/2017                          Rheumatology Markers Lab Results  Component Value Date   RF <10.0 08/31/2016                        Renal Function Markers Lab Results  Component Value Date   BUN 12 10/24/2017   CREATININE 0.80 10/24/2017   GFRAA >60 07/20/2016   GFRNONAA >60 07/20/2016                              Hepatic Function Markers Lab Results  Component Value Date   AST 14 10/24/2017   ALT 11 10/24/2017   ALBUMIN 4.2 10/24/2017   ALKPHOS 65 10/24/2017   HCVAB NEGATIVE 02/14/2017   LIPASE 13 01/15/2017                        Electrolytes Lab Results  Component Value Date   NA 142 10/24/2017   K 4.2 10/24/2017   CL 106 10/24/2017   CALCIUM 9.1 10/24/2017   MG 2.2 05/15/2017                        Neuropathy Markers Lab Results  Component Value Date   VITAMINB12 987 (H) 07/20/2016   HGBA1C 5.3 10/24/2017                        Bone Pathology Markers Lab Results  Component Value Date   25OHVITD1 58 07/20/2016   25OHVITD2 <1.0 07/20/2016   25OHVITD3 58 07/20/2016                         Coagulation Parameters Lab Results  Component Value Date   PLT 262.0 10/24/2017                        Cardiovascular Markers Lab Results  Component Value Date   TROPONINI <0.03 07/05/2016   HGB 13.2 10/24/2017   HCT 40.2 10/24/2017                         CA Markers No results found for: CEA, CA125, LABCA2                      Note: Lab results reviewed.  Recent Diagnostic Imaging Results  DG  Shoulder Left CLINICAL DATA:  Chronic BILATERAL shoulder pain  EXAM: LEFT SHOULDER - 2+ VIEW  COMPARISON:  None  FINDINGS: Osseous mineralization low normal.  AC joint alignment normal.  No acute fracture, dislocation or bone destruction.  Visualized ribs unremarkable.  IMPRESSION: No acute abnormalities.  Electronically Signed   By: Lavonia Dana M.D.   On: 02/11/2018 12:52 DG Knee 1-2 Views Right CLINICAL DATA:  Chronic lateral RIGHT knee  pain  EXAM: RIGHT KNEE - 1-2 VIEW  COMPARISON:  None  FINDINGS: Osseous mineralization normal.  Joint spaces preserved.  No fracture, dislocation, or bone destruction.  Prominent spur at cranial margin of patella at quadriceps tendon insertion.  No knee joint effusion.  IMPRESSION: Patellar spur at quadriceps tendon insertion.  No acute osseous abnormalities.  Electronically Signed   By: Lavonia Dana M.D.   On: 02/11/2018 12:51  Complexity Note: Imaging results reviewed. Results shared with Ms. Kren, using Layman's terms.                         Meds   Current Outpatient Medications:  .  Calcium Carbonate-Vit D-Min (CALCIUM 1200 PO), Take by mouth., Disp: , Rfl:  .  Cholecalciferol (VITAMIN D) 2000 units tablet, Take 4,000 Units by mouth daily., Disp: , Rfl:  .  fluocinonide ointment (LIDEX) 0.05 %, APPLY TOPICALLY TWICE A DAY TO ITCHY AREAS ONLY. as needed, Disp: , Rfl: 0 .  fluticasone (FLONASE) 50 MCG/ACT nasal spray, instill 2 sprays into each nostril once daily, Disp: 16 g, Rfl: 11 .  linaclotide (LINZESS) 145 MCG CAPS capsule, Take 145 mcg by mouth daily before breakfast., Disp: , Rfl:  .  LORazepam (ATIVAN) 1 MG tablet, Take 1 tablet (1 mg total) by mouth 2 (two) times daily as needed for anxiety. Do not drink alcohol while taking this medication., Disp: 30 tablet, Rfl: 1 .  Multiple Vitamin (MULTI-VITAMINS) TABS, Take 1 tablet by mouth daily. , Disp: , Rfl:  .  Omega-3 Fatty Acids (FISH OIL) 1200 MG CAPS, Take by mouth., Disp: , Rfl:  .  [START ON 04/08/2018] oxyCODONE (OXY IR/ROXICODONE) 5 MG immediate release tablet, Take 1 tablet (5 mg total) by mouth every 6 (six) hours as needed for severe pain., Disp: 120 tablet, Rfl: 0 .  RABEprazole (ACIPHEX) 20 MG tablet, Take 20 mg by mouth as needed. , Disp: , Rfl:  .  sucralfate (CARAFATE) 1 g tablet, take 1 tablet by mouth three times a day if needed for abdominal pain, Disp: , Rfl: 0 .  vortioxetine HBr (TRINTELLIX) 5  MG TABS tablet, Take 3 tablets (15 mg total) by mouth daily., Disp: 90 tablet, Rfl: 3 .  ibuprofen (ADVIL,MOTRIN) 800 MG tablet, Take 1 tablet (800 mg total) by mouth every 8 (eight) hours as needed., Disp: 30 tablet, Rfl: 0 .  [START ON 05/08/2018] oxyCODONE (OXY IR/ROXICODONE) 5 MG immediate release tablet, Take 1 tablet (5 mg total) by mouth every 6 (six) hours as needed for severe pain., Disp: 120 tablet, Rfl: 0 .  [START ON 03/09/2018] oxyCODONE (OXY IR/ROXICODONE) 5 MG immediate release tablet, Take 1 tablet (5 mg total) by mouth every 6 (six) hours as needed for severe pain., Disp: 120 tablet, Rfl: 0  ROS  Constitutional: Denies any fever or chills Gastrointestinal: No reported hemesis, hematochezia, vomiting, or acute GI distress Musculoskeletal: Denies any acute onset joint swelling, redness, loss of ROM, or weakness Neurological: No reported episodes of acute onset apraxia, aphasia,  dysarthria, agnosia, amnesia, paralysis, loss of coordination, or loss of consciousness  Allergies  Ms. Madill is allergic to other and food.  PFSH  Drug: Ms. Jarecki  reports that she does not use drugs. Alcohol:  reports that she does not drink alcohol. Tobacco:  reports that she quit smoking about 2 years ago. She started smoking about 43 years ago. She has a 35.00 pack-year smoking history. She has never used smokeless tobacco. Medical:  has a past medical history of Abnormal liver enzymes (02/08/2016), Allergy, Anxiety, Chest pain (07/05/2016), Chronic pain, Depression, DJD (degenerative joint disease), Fibromyalgia, High cholesterol, Rash of back (01/28/2016), and Weight loss due to medication. Surgical: Ms. Mimbs  has a past surgical history that includes Cholecystectomy; Tubal ligation; Ankle surgery; Nasal sinus surgery; Tonsillectomy; Colonoscopy; Esophagogastroduodenoscopy (egd) with propofol (N/A, 02/22/2016); Colonoscopy with propofol (N/A, 02/22/2016); and Breast biopsy (Left, 1997). Family: family history  includes Arthritis in her father, paternal grandfather, and paternal grandmother; Cancer in her brother and father; Dementia in her sister; Diabetes in her sister; Drug abuse in her brother, brother, and sister; Heart disease in her mother; Post-traumatic stress disorder in her brother; Stroke in her mother.  Constitutional Exam  General appearance: Well nourished, well developed, and well hydrated. In no apparent acute distress Vitals:   02/18/18 0957  BP: 135/81  Pulse: 69  Resp: 16  Temp: 98.4 F (36.9 C)  TempSrc: Oral  SpO2: 99%  Weight: 147 lb (66.7 kg)  Height: '5\' 4"'  (1.626 m)   BMI Assessment: Estimated body mass index is 25.23 kg/m as calculated from the following:   Height as of this encounter: '5\' 4"'  (1.626 m).   Weight as of this encounter: 147 lb (66.7 kg). Psych/Mental status: Alert, oriented x 3 (person, place, & time)       Eyes: PERLA Respiratory: No evidence of acute respiratory distress  Cervical Spine Area Exam  Skin & Axial Inspection: No masses, redness, edema, swelling, or associated skin lesions Alignment: Symmetrical Functional ROM: Unrestricted ROM      Stability: No instability detected Muscle Tone/Strength: Functionally intact. No obvious neuro-muscular anomalies detected. Sensory (Neurological): Unimpaired Palpation: Complains of area being tender to palpation              Upper Extremity (UE) Exam    Side: Right upper extremity  Side: Left upper extremity  Skin & Extremity Inspection: Skin color, temperature, and hair growth are WNL. No peripheral edema or cyanosis. No masses, redness, swelling, asymmetry, or associated skin lesions. No contractures.  Skin & Extremity Inspection: Skin color, temperature, and hair growth are WNL. No peripheral edema or cyanosis. No masses, redness, swelling, asymmetry, or associated skin lesions. No contractures.  Functional ROM: Unrestricted ROM          Functional ROM: Decreased ROM          Muscle Tone/Strength:  Movement possible against some resistance (4/5)  Muscle Tone/Strength: Movement possible against some resistance (4/5)  Sensory (Neurological): Unimpaired          Sensory (Neurological): Unimpaired          Palpation: No palpable anomalies              Palpation: No palpable anomalies              Specialized Test(s): Deferred         Specialized Test(s): Deferred          Gait & Posture Assessment  Ambulation: Unassisted Gait: Relatively normal for age  and body habitus Posture: WNL   Assessment  Primary Diagnosis & Pertinent Problem List: The primary encounter diagnosis was Cervical spondylosis (Bilateral). Diagnoses of Chronic neck pain (Primary Source of Pain) (Bilateral) (L>R), Chronic shoulder pain (Tertiary source of pain) (Bilateral) (L>R), Chronic pain syndrome, and Cervical spondylosis with radiculopathy (Bilateral) (L>R) were also pertinent to this visit.  Status Diagnosis  Worsening Worsening Worsening 1. Cervical spondylosis (Bilateral)   2. Chronic neck pain (Primary Source of Pain) (Bilateral) (L>R)   3. Chronic shoulder pain (Tertiary source of pain) (Bilateral) (L>R)   4. Chronic pain syndrome   5. Cervical spondylosis with radiculopathy (Bilateral) (L>R)     Problems updated and reviewed during this visit: No problems updated. Plan of Care  Pharmacotherapy (Medications Ordered): Meds ordered this encounter  Medications  . oxyCODONE (OXY IR/ROXICODONE) 5 MG immediate release tablet    Sig: Take 1 tablet (5 mg total) by mouth every 6 (six) hours as needed for severe pain.    Dispense:  120 tablet    Refill:  0    Do not place this medication, or any other prescription from our practice, on "Automatic Refill". Patient may have prescription filled one day early if pharmacy is closed on scheduled refill date. Do not fill until:05/08/2018 To last until:06/07/2018    Order Specific Question:   Supervising Provider    Answer:   Milinda Pointer [phone]  . oxyCODONE (OXY  IR/ROXICODONE) 5 MG immediate release tablet    Sig: Take 1 tablet (5 mg total) by mouth every 6 (six) hours as needed for severe pain.    Dispense:  120 tablet    Refill:  0    Do not place this medication, or any other prescription from our practice, on "Automatic Refill". Patient may have prescription filled one day early if pharmacy is closed on scheduled refill date. Do not fill until:04/08/2018 To last until:05/08/2018    Order Specific Question:   Supervising Provider    Answer:   Milinda Pointer [phone]  . oxyCODONE (OXY IR/ROXICODONE) 5 MG immediate release tablet    Sig: Take 1 tablet (5 mg total) by mouth every 6 (six) hours as needed for severe pain.    Dispense:  120 tablet    Refill:  0    Do not place this medication, or any other prescription from our practice, on "Automatic Refill". Patient may have prescription filled one day early if pharmacy is closed on scheduled refill date. Do not fill until:03/09/2018 To last until:04/08/2018    Order Specific Question:   Supervising Provider    Answer:   Milinda Pointer [phone]  . ibuprofen (ADVIL,MOTRIN) 800 MG tablet    Sig: Take 1 tablet (800 mg total) by mouth every 8 (eight) hours as needed.    Dispense:  30 tablet    Refill:  0    Order Specific Question:   Supervising Provider    Answer:   Milinda Pointer [phone]   New Prescriptions   IBUPROFEN (ADVIL,MOTRIN) 800 MG TABLET    Take 1 tablet (800 mg total) by mouth every 8 (eight) hours as needed.   Medications administered today: Wahneta Derocher had no medications administered during this visit. Lab-work, procedure(s), and/or referral(s): No orders of the defined types were placed in this encounter.  Imaging and/or referral(s): None  Interventional therapies: Planned, scheduled, and/or pending: Left cervical facet radiofrequencyablation.    Considering: Diagnostic bilateral cervical facet block Possible bilateral cervical facet  radiofrequencyablation Diagnostic left-sided cervical epidural steroid injection  Diagnostic bilateral intra-articular shoulder joint injection Diagnostic bilateral suprascapular nerve block Possible bilateral suprascapular nerve radiofrequencyablation Diagnostic bilateral lumbar facet block Possible bilateral lumbar facet radiofrequencyablation  Diagnostic bilateral intra-articular hip joint injection Diagnostic bilateral femoral and obturator tickler branch blocks Possible bilateral hip joint radiofrequencyablation  Diagnosticright intra-articular knee joint injection Possible series of 5, right-sided, intra-articular knee joint injections with Hyalgan.  Possible right sided genicular nerve block Possible right sided genicular nerve radiofrequencyablation Possible left-sided lumbar epidural steroid injection   Palliative PRN treatment(s): Palliativebilateral suprascapular nerve block Palliativeright intra-articular knee joint injection      Provider-requested follow-up: Return in about 3 months (around 05/20/2018) for MedMgmt with Me Donella Stade Edison Pace).  Future Appointments  Date Time Provider [place]  03/28/2018 10:30 AM Milinda Pointer, MD [facility] None  05/17/2018  9:00 AM Leone Haven, MD [id number]  05/20/2018  9:00 AM Vevelyn Francois, NP [facility] None  10/22/2018 10:00 AM Harlin Heys, MD [facility] None   Primary Care Physician: Leone Haven, MD Location: [facility] Note by: Vevelyn Francois NP Date: 02/18/2018; Time: 1:34 PM  Pain Score Disclaimer: We use the NRS-11 scale. This is a self-reported, subjective measurement of pain severity with only modest accuracy. It is used primarily to identify changes within a particular patient. It must be understood that outpatient pain scales are significantly less accurate that those used for research, where they can be applied under ideal controlled circumstances  with minimal exposure to variables. In reality, the score is likely to be a combination of pain intensity and pain affect, where pain affect describes the degree of emotional arousal or changes in action readiness caused by the sensory experience of pain. Factors such as social and work situation, setting, emotional state, anxiety levels, expectation, and prior pain experience may influence pain perception and show large inter-individual differences that may also be affected by time variables.  Patient instructions provided during this appointment: Patient Instructions  ____________________________________________________________________________________________  Medication Rules  Applies to: All patients receiving prescriptions (written or electronic).  Pharmacy of record: Pharmacy where electronic prescriptions will be sent. If written prescriptions are taken to a different pharmacy, please inform the nursing staff. The pharmacy listed in the electronic medical record should be the one where you would like electronic prescriptions to be sent.  Prescription refills: Only during scheduled appointments. Applies to both, written and electronic prescriptions.  NOTE: The following applies primarily to controlled substances (Opioid* Pain Medications).   Patient's responsibilities: 1. Pain Pills: Bring all pain pills to every appointment (except for procedure appointments). 2. Pill Bottles: Bring pills in original pharmacy bottle. Always bring newest bottle. Bring bottle, even if empty. 3. Medication refills: You are responsible for knowing and keeping track of what medications you need refilled. The day before your appointment, write a list of all prescriptions that need to be refilled. Bring that list to your appointment and give it to the admitting nurse. Prescriptions will be written only during appointments. If you forget a medication, it will not be "Called in", "Faxed", or "electronically sent".  You will need to get another appointment to get these prescribed. 4. Prescription Accuracy: You are responsible for carefully inspecting your prescriptions before leaving our office. Have the discharge nurse carefully go over each prescription with you, before taking them home. Make sure that your name is accurately spelled, that your address is correct. Check the name and dose of your medication to make sure it is accurate. Check the number  of pills, and the written instructions to make sure they are clear and accurate. Make sure that you are given enough medication to last until your next medication refill appointment. 5. Taking Medication: Take medication as prescribed. Never take more pills than instructed. Never take medication more frequently than prescribed. Taking less pills or less frequently is permitted and encouraged, when it comes to controlled substances (written prescriptions).  6. Inform other Doctors: Always inform, all of your healthcare providers, of all the medications you take. 7. Pain Medication from other Providers: You are not allowed to accept any additional pain medication from any other Doctor or Healthcare provider. There are two exceptions to this rule. (see below) In the event that you require additional pain medication, you are responsible for notifying us, as stated below. 8. Medication Agreement: You are responsible for carefully reading and following our Medication Agreement. This must be signed before receiving any prescriptions from our practice. Safely store a copy of your signed Agreement. Violations to the Agreement will result in no further prescriptions. (Additional copies of our Medication Agreement are available upon request.) 9. Laws, Rules, & Regulations: All patients are expected to follow all Federal and [employer], [employer], Rules, [employer]. Ignorance of the Laws does not constitute a valid excuse. The use of any illegal substances is  prohibited. 10. Adopted CDC guidelines & recommendations: Target dosing levels will be at or below 60 MME/day. Use of benzodiazepines** is not recommended.  Exceptions: There are only two exceptions to the rule of not receiving pain medications from other Healthcare Providers. 1. Exception #1 (Emergencies): In the event of an emergency (i.e.: accident requiring emergency care), you are allowed to receive additional pain medication. However, you are responsible for: As soon as you are able, call our office [phone], at any time of the day or night, and leave a message stating your name, the date and nature of the emergency, and the name and dose of the medication prescribed. In the event that your call is answered by a member of our staff, make sure to document and save the date, time, and the name of the person that took your information.  2. Exception #2 (Planned Surgery): In the event that you are scheduled by another doctor or dentist to have any type of surgery or procedure, you are allowed (for a period no longer than 30 days), to receive additional pain medication, for the acute post-op pain. However, in this case, you are responsible for picking up a copy of our "Post-op Pain Management for Surgeons" handout, and giving it to your surgeon or dentist. This document is available at our office, and does not require an appointment to obtain it. Simply go to our office during business hours (Monday-Thursday from 8:00 AM to 4:00 PM) (Friday 8:00 AM to 12:00 Noon) or if you have a scheduled appointment with [place], prior to your surgery, and ask for it by name. In addition, you will need to provide [place] with your name, name of your surgeon, type of surgery, and date of procedure or surgery.  *Opioid medications include: morphine, codeine, oxycodone, oxymorphone, hydrocodone, hydromorphone, meperidine, tramadol, tapentadol, buprenorphine, fentanyl, methadone. **Benzodiazepine medications include: diazepam  (Valium), alprazolam (Xanax), clonazepam (Klonopine), lorazepam (Ativan), clorazepate (Tranxene), chlordiazepoxide (Librium), estazolam (Prosom), oxazepam (Serax), temazepam (Restoril), triazolam (Halcion) (Last updated: 01/03/2018) ____________________________________________________________________________________________  A prescription for Ibuprofen was sent to your pharmacy. You were given 3 prescriptions for Oxycodone today.

## 2018-02-18 NOTE — Progress Notes (Signed)
Nursing Pain Medication Assessment:  Safety precautions to be maintained throughout the outpatient stay will include: orient to surroundings, keep bed in low position, maintain call bell within reach at all times, provide assistance with transfer out of bed and ambulation.  Medication Inspection Compliance: Pill count conducted under aseptic conditions, in front of the patient. Neither the pills nor the bottle was removed from the patient's sight at any time. Once count was completed pills were immediately returned to the patient in their original bottle.  Medication: Oxycodone IR Pill/Patch Count: 70 of 120 pills remain Pill/Patch Appearance: Markings consistent with prescribed medication Bottle Appearance: Standard pharmacy container. Clearly labeled. Filled Date: 04/04/ 2019 Last Medication intake:  Today

## 2018-02-18 NOTE — Patient Instructions (Addendum)
____________________________________________________________________________________________  Medication Rules  Applies to: All patients receiving prescriptions (written or electronic).  Pharmacy of record: Pharmacy where electronic prescriptions will be sent. If written prescriptions are taken to a different pharmacy, please inform the nursing staff. The pharmacy listed in the electronic medical record should be the one where you would like electronic prescriptions to be sent.  Prescription refills: Only during scheduled appointments. Applies to both, written and electronic prescriptions.  NOTE: The following applies primarily to controlled substances (Opioid* Pain Medications).   Patient's responsibilities: 1. Pain Pills: Bring all pain pills to every appointment (except for procedure appointments). 2. Pill Bottles: Bring pills in original pharmacy bottle. Always bring newest bottle. Bring bottle, even if empty. 3. Medication refills: You are responsible for knowing and keeping track of what medications you need refilled. The day before your appointment, write a list of all prescriptions that need to be refilled. Bring that list to your appointment and give it to the admitting nurse. Prescriptions will be written only during appointments. If you forget a medication, it will not be "Called in", "Faxed", or "electronically sent". You will need to get another appointment to get these prescribed. 4. Prescription Accuracy: You are responsible for carefully inspecting your prescriptions before leaving our office. Have the discharge nurse carefully go over each prescription with you, before taking them home. Make sure that your name is accurately spelled, that your address is correct. Check the name and dose of your medication to make sure it is accurate. Check the number of pills, and the written instructions to make sure they are clear and accurate. Make sure that you are given enough medication to last  until your next medication refill appointment. 5. Taking Medication: Take medication as prescribed. Never take more pills than instructed. Never take medication more frequently than prescribed. Taking less pills or less frequently is permitted and encouraged, when it comes to controlled substances (written prescriptions).  6. Inform other Doctors: Always inform, all of your healthcare providers, of all the medications you take. 7. Pain Medication from other Providers: You are not allowed to accept any additional pain medication from any other Doctor or Healthcare provider. There are two exceptions to this rule. (see below) In the event that you require additional pain medication, you are responsible for notifying us, as stated below. 8. Medication Agreement: You are responsible for carefully reading and following our Medication Agreement. This must be signed before receiving any prescriptions from our practice. Safely store a copy of your signed Agreement. Violations to the Agreement will result in no further prescriptions. (Additional copies of our Medication Agreement are available upon request.) 9. Laws, Rules, & Regulations: All patients are expected to follow all Federal and State Laws, Statutes, Rules, & Regulations. Ignorance of the Laws does not constitute a valid excuse. The use of any illegal substances is prohibited. 10. Adopted CDC guidelines & recommendations: Target dosing levels will be at or below 60 MME/day. Use of benzodiazepines** is not recommended.  Exceptions: There are only two exceptions to the rule of not receiving pain medications from other Healthcare Providers. 1. Exception #1 (Emergencies): In the event of an emergency (i.e.: accident requiring emergency care), you are allowed to receive additional pain medication. However, you are responsible for: As soon as you are able, call our office [phone], at any time of the day or night, and leave a message stating your name, the  date and nature of the emergency, and the name and dose of the medication   prescribed. In the event that your call is answered by a member of our staff, make sure to document and save the date, time, and the name of the person that took your information.  2. Exception #2 (Planned Surgery): In the event that you are scheduled by another doctor or dentist to have any type of surgery or procedure, you are allowed (for a period no longer than 30 days), to receive additional pain medication, for the acute post-op pain. However, in this case, you are responsible for picking up a copy of our "Post-op Pain Management for Surgeons" handout, and giving it to your surgeon or dentist. This document is available at our office, and does not require an appointment to obtain it. Simply go to our office during business hours (Monday-Thursday from 8:00 AM to 4:00 PM) (Friday 8:00 AM to 12:00 Noon) or if you have a scheduled appointment with [place], prior to your surgery, and ask for it by name. In addition, you will need to provide [place] with your name, name of your surgeon, type of surgery, and date of procedure or surgery.  *Opioid medications include: morphine, codeine, oxycodone, oxymorphone, hydrocodone, hydromorphone, meperidine, tramadol, tapentadol, buprenorphine, fentanyl, methadone. **Benzodiazepine medications include: diazepam (Valium), alprazolam (Xanax), clonazepam (Klonopine), lorazepam (Ativan), clorazepate (Tranxene), chlordiazepoxide (Librium), estazolam (Prosom), oxazepam (Serax), temazepam (Restoril), triazolam (Halcion) (Last updated: 01/03/2018) ____________________________________________________________________________________________  A prescription for Ibuprofen was sent to your pharmacy. You were given 3 prescriptions for Oxycodone today.

## 2018-02-28 ENCOUNTER — Ambulatory Visit: Payer: BLUE CROSS/BLUE SHIELD | Admitting: Nurse Practitioner

## 2018-03-07 ENCOUNTER — Other Ambulatory Visit: Payer: Self-pay | Admitting: Family Medicine

## 2018-03-11 ENCOUNTER — Other Ambulatory Visit: Payer: Self-pay | Admitting: Family Medicine

## 2018-03-11 NOTE — Telephone Encounter (Signed)
Last OV 02/12/18 last filled 01/31/18 30 1rf

## 2018-03-28 ENCOUNTER — Ambulatory Visit
Admission: RE | Admit: 2018-03-28 | Discharge: 2018-03-28 | Disposition: A | Discharge status: Home / Self Care | Payer: BLUE CROSS/BLUE SHIELD | Source: Ambulatory Visit | Attending: Pain Medicine | Admitting: Pain Medicine

## 2018-03-28 ENCOUNTER — Ambulatory Visit (HOSPITAL_BASED_OUTPATIENT_CLINIC_OR_DEPARTMENT_OTHER): Payer: BLUE CROSS/BLUE SHIELD | Admitting: Pain Medicine

## 2018-03-28 ENCOUNTER — Encounter: Payer: Self-pay | Admitting: Pain Medicine

## 2018-03-28 VITALS — BP 148/99 | HR 69 | Temp 98.3°F | Resp 16 | Ht 64.0 in | Wt 150.0 lb

## 2018-03-28 DIAGNOSIS — Z79891 Long term (current) use of opiate analgesic: Secondary | ICD-10-CM | POA: Diagnosis not present

## 2018-03-28 DIAGNOSIS — M542 Cervicalgia: Secondary | ICD-10-CM | POA: Insufficient documentation

## 2018-03-28 DIAGNOSIS — Z9851 Tubal ligation status: Secondary | ICD-10-CM | POA: Diagnosis not present

## 2018-03-28 DIAGNOSIS — Z888 Allergy status to other drugs, medicaments and biological substances status: Secondary | ICD-10-CM | POA: Insufficient documentation

## 2018-03-28 DIAGNOSIS — R109 Unspecified abdominal pain: Secondary | ICD-10-CM | POA: Insufficient documentation

## 2018-03-28 DIAGNOSIS — M546 Pain in thoracic spine: Secondary | ICD-10-CM

## 2018-03-28 DIAGNOSIS — M5414 Radiculopathy, thoracic region: Secondary | ICD-10-CM

## 2018-03-28 DIAGNOSIS — G894 Chronic pain syndrome: Secondary | ICD-10-CM | POA: Insufficient documentation

## 2018-03-28 DIAGNOSIS — R0781 Pleurodynia: Secondary | ICD-10-CM | POA: Diagnosis not present

## 2018-03-28 DIAGNOSIS — G8918 Other acute postprocedural pain: Secondary | ICD-10-CM

## 2018-03-28 DIAGNOSIS — M47812 Spondylosis without myelopathy or radiculopathy, cervical region: Secondary | ICD-10-CM | POA: Insufficient documentation

## 2018-03-28 DIAGNOSIS — Z9889 Other specified postprocedural states: Secondary | ICD-10-CM | POA: Diagnosis not present

## 2018-03-28 DIAGNOSIS — Z79899 Other long term (current) drug therapy: Secondary | ICD-10-CM | POA: Diagnosis not present

## 2018-03-28 DIAGNOSIS — M47894 Other spondylosis, thoracic region: Secondary | ICD-10-CM

## 2018-03-28 DIAGNOSIS — G8929 Other chronic pain: Secondary | ICD-10-CM

## 2018-03-28 DIAGNOSIS — Z9049 Acquired absence of other specified parts of digestive tract: Secondary | ICD-10-CM | POA: Diagnosis not present

## 2018-03-28 MED ORDER — MIDAZOLAM HCL 5 MG/5ML IJ SOLN
1.0000 mg | INTRAMUSCULAR | Status: DC | PRN
  Administered 2018-03-28: 5 mg via INTRAVENOUS

## 2018-03-28 MED ORDER — MIDAZOLAM HCL 5 MG/5ML IJ SOLN
INTRAMUSCULAR | Status: AC
  Filled 2018-03-28: qty 5

## 2018-03-28 MED ORDER — LIDOCAINE HCL 2 % IJ SOLN
INTRAMUSCULAR | Status: AC
Start: 2018-03-28 — End: ?
  Filled 2018-03-28: qty 20

## 2018-03-28 MED ORDER — DEXAMETHASONE SODIUM PHOSPHATE 10 MG/ML IJ SOLN
10.0000 mg | Freq: Once | INTRAMUSCULAR | Status: AC
  Administered 2018-03-28: 10 mg

## 2018-03-28 MED ORDER — ROPIVACAINE HCL 2 MG/ML IJ SOLN
9.0000 mL | Freq: Once | INTRAMUSCULAR | Status: AC
  Administered 2018-03-28: 9 mL via PERINEURAL

## 2018-03-28 MED ORDER — HYDROCODONE-ACETAMINOPHEN 5-325 MG PO TABS: 1.0000 | ORAL_TABLET | Freq: Four times a day (QID) | ORAL | 0 refills | Status: AC | PRN

## 2018-03-28 MED ORDER — FENTANYL CITRATE (PF) 100 MCG/2ML IJ SOLN
25.0000 ug | INTRAMUSCULAR | Status: DC | PRN
  Administered 2018-03-28: 100 ug via INTRAVENOUS

## 2018-03-28 MED ORDER — FENTANYL CITRATE (PF) 100 MCG/2ML IJ SOLN
INTRAMUSCULAR | Status: AC
  Filled 2018-03-28: qty 2

## 2018-03-28 MED ORDER — LACTATED RINGERS IV SOLN
1000.0000 mL | Freq: Once | INTRAVENOUS | Status: AC
  Administered 2018-03-28: 1000 mL via INTRAVENOUS

## 2018-03-28 MED ORDER — LIDOCAINE HCL 2 % IJ SOLN
20.0000 mL | Freq: Once | INTRAMUSCULAR | Status: AC
  Administered 2018-03-28: 400 mg

## 2018-03-28 MED ORDER — DEXAMETHASONE SODIUM PHOSPHATE 10 MG/ML IJ SOLN
INTRAMUSCULAR | Status: AC
  Filled 2018-03-28: qty 1

## 2018-03-28 NOTE — Patient Instructions (Addendum)
____You have been given a script for Hydrocodone today.  You have bbeen instructed to go to the medical mall to get an xray done before you come back for  Your follow up appointment. ________________________________________________________________________________________  Post-Procedure Discharge Instructions  Instructions:  Apply ice: Fill a plastic sandwich bag with crushed ice. Cover it with a small towel and apply to injection site. Apply for 15 minutes then remove x 15 minutes. Repeat sequence on day of procedure, until you go to bed. The purpose is to minimize swelling and discomfort after procedure.  Apply heat: Apply heat to procedure site starting the day following the procedure. The purpose is to treat any soreness and discomfort from the procedure.  Food intake: Start with clear liquids (like water) and advance to regular food, as tolerated.   Physical activities: Keep activities to a minimum for the first 8 hours after the procedure.   Driving: If you have received any sedation, you are not allowed to drive for 24 hours after your procedure.  Blood thinner: Restart your blood thinner 6 hours after your procedure. (Only for those taking blood thinners)  Insulin: As soon as you can eat, you may resume your normal dosing schedule. (Only for those taking insulin)  Infection prevention: Keep procedure site clean and dry.  Post-procedure Pain Diary: Extremely important that this be done correctly and accurately. Recorded information will be used to determine the next step in treatment.  Pain evaluated is that of treated area only. Do not include pain from an untreated area.  Complete every hour, on the hour, for the initial 8 hours. Set an alarm to help you do this part accurately.  Do not go to sleep and have it completed later. It will not be accurate.  Follow-up appointment: Keep your follow-up appointment after the procedure. Usually 2 weeks for most procedures. (6 weeks in  the case of radiofrequency.) Bring you pain diary.   Expect:  From numbing medicine (AKA: Local Anesthetics): Numbness or decrease in pain.  Onset: Full effect within 15 minutes of injected.  Duration: It will depend on the type of local anesthetic used. On the average, 1 to 8 hours.   From steroids: Decrease in swelling or inflammation. Once inflammation is improved, relief of the pain will follow.  Onset of benefits: Depends on the amount of swelling present. The more swelling, the longer it will take for the benefits to be seen. In some cases, up to 10 days.  Duration: Steroids will stay in the system x 2 weeks. Duration of benefits will depend on multiple posibilities including persistent irritating factors.  From procedure: Some discomfort is to be expected once the numbing medicine wears off. This should be minimal if ice and heat are applied as instructed.  Call if:  You experience numbness and weakness that gets worse with time, as opposed to wearing off.  New onset bowel or bladder incontinence. (This applies to Spinal procedures only)  Emergency Numbers:  Durning business hours (Monday - Thursday, 8:00 AM - 4:00 PM) (Friday, 9:00 AM - 12:00 Noon): [phone]  After hours: [phone] ____________________________________________________________________________________________   ____________________________________________________________________________________________  Preparing for Procedure with Sedation  Instructions: . Oral Intake: Do not eat or drink anything for at least 8 hours prior to your procedure. . Transportation: Public transportation is not allowed. Bring an adult driver. The driver must be physically present in our waiting room before any procedure can be started. Marland Kitchen Physical Assistance: Bring an adult physically capable of assisting  you, in the event you need help. This adult should keep you company at home for at least 6 hours after the  procedure. . Blood Pressure Medicine: Take your blood pressure medicine with a sip of water the morning of the procedure. . Blood thinners:  . Diabetics on insulin: Notify the staff so that you can be scheduled 1st case in the morning. If your diabetes requires high dose insulin, take only  of your normal insulin dose the morning of the procedure and notify the staff that you have done so. . Preventing infections: Shower with an antibacterial soap the morning of your procedure. . Build-up your immune system: Take 1000 mg of Vitamin C with every meal (3 times a day) the day prior to your procedure. Marland Kitchen Antibiotics: Inform the staff if you have a condition or reason that requires you to take antibiotics before dental procedures. . Pregnancy: If you are pregnant, call and cancel the procedure. . Sickness: If you have a cold, fever, or any active infections, call and cancel the procedure. . Arrival: You must be in the facility at least 30 minutes prior to your scheduled procedure. . Children: Do not bring children with you. . Dress appropriately: Bring dark clothing that you would not mind if they get stained. . Valuables: Do not bring any jewelry or valuables.  Procedure appointments are reserved for interventional treatments only. Marland Kitchen No Prescription Refills. . No medication changes will be discussed during procedure appointments. . No disability issues will be discussed.  Remember:  Regular Business hours are:  Monday to Thursday 8:00 AM to 4:00 PM  Provider's Schedule: Milinda Pointer, MD:  Procedure days: Tuesday and Thursday 7:30 AM to 4:00 PM  Gillis Santa, MD:  Procedure days: Monday and Wednesday 7:30 AM to 4:00 PM ____________________________________________________________________________________________   ____________________________________________________________________________________________  CANNABIDIOL (AKA: CBD Oil or Pills)  Applies to: All patients receiving  prescriptions of controlled substances (written and/or electronic).  General Information: Cannabidiol (CBD) was discovered in 87. It is one of some 113 identified cannabinoids in cannabis (Marijuana) plants, accounting for up to 40% of the plant's extract. As of 2018, preliminary clinical research on cannabidiol included studies of anxiety, cognition, movement disorders, and pain.  Cannabidiol is consummed in multiple ways, including inhalation of cannabis smoke or vapor, as an aerosol spray into the cheek, and by mouth. It may be supplied as CBD oil containing CBD as the active ingredient (no added tetrahydrocannabinol (THC) or terpenes), a full-plant CBD-dominant hemp extract oil, capsules, dried cannabis, or as a liquid solution. CBD is thought not have the same psychoactivity as THC, and may affect the actions of THC. Studies suggest that CBD may interact with different biological targets, including cannabinoid receptors and other neurotransmitter receptors. As of 2018 the mechanism of action for its biological effects has not been determined.  In the [place], cannabidiol has a limited approval by the Food and Drug Administration (FDA) for treatment of only two types of epilepsy disorders. The side effects of long-term use of the drug include somnolence, decreased appetite, diarrhea, fatigue, malaise, weakness, sleeping problems, and others.  CBD remains a Schedule I drug prohibited for any use.  Legality: Some manufacturers ship CBD products nationally, an illegal action which the FDA has not enforced in 2018, with CBD remaining the subject of an FDA investigational new drug evaluation, and is not considered legal as a dietary supplement or food ingredient as of December 2018. Federal illegality has made it difficult historically to conduct research  on CBD. CBD is openly sold in head shops and health food stores in some states where such sales have not been explicitly legalized.  Warning:  Because it is not FDA approved for general use or treatment of pain, it is not required to undergo the same manufacturing controls as prescription drugs.  This means that the available cannabidiol (CBD) may be contaminated with THC.  If this is the case, it will trigger a positive urine drug screen (UDS) test for cannabinoids (Marijuana).  Because a positive UDS for illicit substances is a violation of our medication agreement, your opioid analgesics (pain medicine) may be permanently discontinued. (Last update: 01/24/2018) ____________________________________________________________________________________________   Preparing for Procedure with Sedation Instructions: . Oral Intake: Do not eat or drink anything for at least 8 hours prior to your procedure. . Transportation: Public transportation is not allowed. Bring an adult driver. The driver must be physically present in our waiting room before any procedure can be started. Marland Kitchen Physical Assistance: Bring an adult capable of physically assisting you, in the event you need help. . Blood Pressure Medicine: Take your blood pressure medicine with a sip of water the morning of the procedure. . Insulin: Take only  of your normal insulin dose. . Preventing infections: Shower with an antibacterial soap the morning of your procedure. . Build-up your immune system: Take 1000 mg of Vitamin C with every meal (3 times a day) the day prior to your procedure. . Pregnancy: If you are pregnant, call and cancel the procedure. . Sickness: If you have a cold, fever, or any active infections, call and cancel the procedure. . Arrival: You must be in the facility at least 30 minutes prior to your scheduled procedure. . Children: Do not bring children with you. . Dress appropriately: Bring dark clothing that you would not mind if they get stained. . Valuables: Do not bring any jewelry or valuables. Procedure appointments are reserved for interventional treatments  only. Marland Kitchen No Prescription Refills. . No medication changes will be discussed during procedure appointments. . No disability issues will be discussed.

## 2018-03-28 NOTE — Progress Notes (Signed)
Patient's Name: Nicole Walter  MRN: [id number]  Referring Provider: Milinda Pointer, MD  DOB: 01-Feb-1961  PCP: Leone Haven, MD  DOS: 03/28/2018  Note by: Gaspar Cola, MD  Service setting: Ambulatory outpatient  Specialty: Interventional Pain Management  Patient type: Established  Location: [facility] (AMB) Pain Management Facility  Visit type: Interventional Procedure   Primary Reason for Visit: Interventional Pain Management Treatment. CC: Neck Pain (left) and Abdominal Pain (rib pain)  Procedure:       Anesthesia, Analgesia, Anxiolysis:  Type: Cervical Facet, Medial Branch Radiofrequency Ablation Primary Purpose: Therapeutic Region: Posterolateral cervical spine region Level: C3, C4, C5, C6, & C7 Medial Branch Level(s). Lesioning of these levels should completely denervate the C3-4, C4-5, C5-6, and the C6-7 cervical facet joints. Laterality: Left Paraspinal  Type: Moderate (Conscious) Sedation combined with Local Anesthesia Indication(s): Analgesia and Anxiety Route: Intravenous (IV) IV Access: Secured Sedation: Meaningful verbal contact was maintained at all times during the procedure  Local Anesthetic: Lidocaine 1-2%  Position: Prone with head of the table was raised to facilitate breathing.   Indications: 1. Spondylosis without myelopathy or radiculopathy, cervical region   2. Cervical facet syndrome (Bilateral) (L>R)   3. Cervical spondylosis (Bilateral)   4. Chronic pain syndrome   5. Cervicalgia    Nicole Walter has been dealing with the above chronic pain for longer than three months and has either failed to respond, was unable to tolerate, or simply did not get enough benefit from other more conservative therapies including, but not limited to: 1. Over-the-counter medications 2. Anti-inflammatory medications 3. Muscle relaxants 4. Membrane stabilizers 5. Opioids 6. Physical therapy 7. Modalities (Heat, ice, etc.) 8. Invasive techniques such as nerve blocks. [name] has attained more than 50% relief of the pain from a series of diagnostic injections conducted in separate occasions.  Pain Score: Pre-procedure: 2 /10 Post-procedure: 0-No pain/10  Pre-op Assessment:  Nicole Walter is a 57 y.o. (year old), female patient, seen today for interventional treatment. She  has a past surgical history that includes Cholecystectomy; Tubal ligation; Ankle surgery; Nasal sinus surgery; Tonsillectomy; Colonoscopy; Esophagogastroduodenoscopy (egd) with propofol (N/A, 02/22/2016); Colonoscopy with propofol (N/A, 02/22/2016); and Breast biopsy (Left, 1997). Nicole Walter has a current medication list which includes the following prescription(s): calcium carbonate-vit d-min, vitamin d, fluocinonide ointment, fluticasone, linaclotide, lorazepam, multi-vitamins, fish oil, oxycodone, oxycodone, oxycodone, rabeprazole, sucralfate, vortioxetine hbr, hydrocodone-acetaminophen, and ibuprofen, and the following Facility-Administered Medications: fentanyl and midazolam. Her primarily concern today is the Neck Pain (left) and Abdominal Pain (rib pain)  The patient is complaining today of some pain in the upper back and thoracic region.  I have reviewed her chart and there is an MRI that was done in 2014 that seems to have been ordered by Dr. Sharlet Salina.  This MRI shows that the patient has a disc protrusion.  However, this pain seems to have been under control until recently.  Because of this, I will be ordering today an x-ray of the thoracic region.  There is no imaging of this area since that MRI done in 2014.  Initial Vital Signs:  Pulse/HCG Rate: 87ECG Heart Rate: 66 Temp: 98.3 F (36.8 C) Resp: 16 BP: 134/81 SpO2: 99 %  BMI: Estimated body mass index is 25.75 kg/m as calculated from the following:   Height as of this encounter: 5\' 4"  (1.626 m).   Weight as of this encounter: 150 lb (68 kg).  Risk Assessment: Allergies: Reviewed. She is allergic to other and food.  Allergy  Precautions: None required Coagulopathies: Reviewed. None identified.  Blood-thinner therapy: None at this time Active Infection(s): Reviewed. None identified. Nicole Walter is afebrile  Site Confirmation: Nicole Walter was asked to confirm the procedure and laterality before marking the site Procedure checklist: Completed Consent: Before the procedure and under the influence of no sedative(s), amnesic(s), or anxiolytics, the patient was informed of the treatment options, risks and possible complications. To fulfill our ethical and legal obligations, as recommended by the American Medical Association's Code of Ethics, I have informed the patient of my clinical impression; the nature and purpose of the treatment or procedure; the risks, benefits, and possible complications of the intervention; the alternatives, including doing nothing; the risk(s) and benefit(s) of the alternative treatment(s) or procedure(s); and the risk(s) and benefit(s) of doing nothing. The patient was provided information about the general risks and possible complications associated with the procedure. These may include, but are not limited to: failure to achieve desired goals, infection, bleeding, organ or nerve damage, allergic reactions, paralysis, and death. In addition, the patient was informed of those risks and complications associated to Spine-related procedures, such as failure to decrease pain; infection (i.e.: Meningitis, epidural or intraspinal abscess); bleeding (i.e.: epidural hematoma, subarachnoid hemorrhage, or any other type of intraspinal or peri-dural bleeding); organ or nerve damage (i.e.: Any type of peripheral nerve, nerve root, or spinal cord injury) with subsequent damage to sensory, motor, and/or autonomic systems, resulting in permanent pain, numbness, and/or weakness of one or several areas of the body; allergic reactions; (i.e.: anaphylactic reaction); and/or death. Furthermore, the patient was informed of those  risks and complications associated with the medications. These include, but are not limited to: allergic reactions (i.e.: anaphylactic or anaphylactoid reaction(s)); adrenal axis suppression; blood sugar elevation that in diabetics may result in ketoacidosis or comma; water retention that in patients with history of congestive heart failure may result in shortness of breath, pulmonary edema, and decompensation with resultant heart failure; weight gain; swelling or edema; medication-induced neural toxicity; particulate matter embolism and blood vessel occlusion with resultant organ, and/or nervous system infarction; and/or aseptic necrosis of one or more joints. Finally, the patient was informed that Medicine is not an exact science; therefore, there is also the possibility of unforeseen or unpredictable risks and/or possible complications that may result in a catastrophic outcome. The patient indicated having understood very clearly. We have given the patient no guarantees and we have made no promises. Enough time was given to the patient to ask questions, all of which were answered to the patient's satisfaction. Nicole Walter has indicated that she wanted to continue with the procedure. Attestation: I, the ordering provider, attest that I have discussed with the patient the benefits, risks, side-effects, alternatives, likelihood of achieving goals, and potential problems during recovery for the procedure that I have provided informed consent. Date  Time: 03/28/2018 10:10 AM  Pre-Procedure Preparation:  Monitoring: As per clinic protocol. Respiration, ETCO2, SpO2, BP, heart rate and rhythm monitor placed and checked for adequate function Safety Precautions: Patient was assessed for positional comfort and pressure points before starting the procedure. Time-out: I initiated and conducted the "Time-out" before starting the procedure, as per protocol. The patient was asked to participate by confirming the accuracy  of the "Time Out" information. Verification of the correct person, site, and procedure were performed and confirmed by me, the nursing staff, and the patient. "Time-out" conducted as per Joint Commission's Universal Protocol (UP.01.01.01). Time: 1110  Description of Procedure:  Laterality: Left Level: C3, C4, C5, C6, & C7 Medial Branch Level(s). Area Prepped: Entire Posterior Cervico-thoracic Region Prepping solution: ChloraPrep (2% chlorhexidine gluconate and 70% isopropyl alcohol) Safety Precautions: Aspiration looking for blood return was conducted prior to all injections. At no point did we inject any substances, as a needle was being advanced. Before injecting, the patient was told to immediately notify me if she was experiencing any new onset of "ringing in the ears, or metallic taste in the mouth". No attempts were made at seeking any paresthesias. Safe injection practices and needle disposal techniques used. Medications properly checked for expiration dates. SDV (single dose vial) medications used. After the completion of the procedure, all disposable equipment used was discarded in the proper designated medical waste containers. Local Anesthesia: Protocol guidelines were followed. The patient was positioned over the fluoroscopy table. The area was prepped in the usual manner. The time-out was completed. The target area was identified using fluoroscopy. A 12-in long, straight, sterile hemostat was used with fluoroscopic guidance to locate the targets for each level blocked. Once located, the skin was marked with an approved surgical skin marker. Once all sites were marked, the skin (epidermis, dermis, and hypodermis), as well as deeper tissues (fat, connective tissue and muscle) were infiltrated with a small amount of a short-acting local anesthetic, loaded on a 10cc syringe with a 25G, 1.5-in  Needle. An appropriate amount of time was allowed for local anesthetics to take effect before  proceeding to the next step. Local Anesthetic: Lidocaine 2.0% The unused portion of the local anesthetic was discarded in the proper designated containers. Technical explanation of process:  Radiofrequency Ablation (RFA) C3 Medial Branch Nerve RFA: The target area for the C3 dorsal medial articular branch is the lateral concave waist of the articular pillar of C3. Under fluoroscopic guidance, a Radiofrequency needle was inserted until contact was made with os over the postero-lateral aspect of the articular pillar of C3 (target area). Sensory and motor testing was conducted to properly adjust the position of the needle. Once satisfactory placement of the needle was achieved, the numbing solution was slowly injected after negative aspiration for blood. 2.0 mL of the nerve block solution was injected without difficulty or complication. After waiting for at least 3 minutes, the ablation was performed. Once completed, the needle was removed intact. C4 Medial Branch Nerve RFA: The target area for the C4 dorsal medial articular branch is the lateral concave waist of the articular pillar of C4. Under fluoroscopic guidance, a Radiofrequency needle was inserted until contact was made with os over the postero-lateral aspect of the articular pillar of C4 (target area). Sensory and motor testing was conducted to properly adjust the position of the needle. Once satisfactory placement of the needle was achieved, the numbing solution was slowly injected after negative aspiration for blood. 2.0 mL of the nerve block solution was injected without difficulty or complication. After waiting for at least 3 minutes, the ablation was performed. Once completed, the needle was removed intact. C5 Medial Branch Nerve RFA: The target area for the C5 dorsal medial articular branch is the lateral concave waist of the articular pillar of C5. Under fluoroscopic guidance, a Radiofrequency needle was inserted until contact was made with os  over the postero-lateral aspect of the articular pillar of C5 (target area). Sensory and motor testing was conducted to properly adjust the position of the needle. Once satisfactory placement of the needle was achieved, the numbing solution was slowly injected after negative aspiration for  blood. 2.0 mL of the nerve block solution was injected without difficulty or complication. After waiting for at least 3 minutes, the ablation was performed. Once completed, the needle was removed intact. C6 Medial Branch Nerve RFA: The target area for the C6 dorsal medial articular branch is the lateral concave waist of the articular pillar of C6. Under fluoroscopic guidance, a Radiofrequency needle was inserted until contact was made with os over the postero-lateral aspect of the articular pillar of C6 (target area). Sensory and motor testing was conducted to properly adjust the position of the needle. Once satisfactory placement of the needle was achieved, the numbing solution was slowly injected after negative aspiration for blood. 2.0 mL of the nerve block solution was injected without difficulty or complication. After waiting for at least 3 minutes, the ablation was performed. Once completed, the needle was removed intact. C7 Medial Branch Nerve RFA: The target for the C7 dorsal medial articular branch lies on the superior-medial tip of the C7 transverse process. Under fluoroscopic guidance, a Radiofrequency needle was inserted until contact was made with os over the postero-lateral aspect of the articular pillar of C7 (target area). Sensory and motor testing was conducted to properly adjust the position of the needle. Once satisfactory placement of the needle was achieved, the numbing solution was slowly injected after negative aspiration for blood. 2.0 mL of the nerve block solution was injected without difficulty or complication. After waiting for at least 3 minutes, the ablation was performed. Once completed, the needle  was removed intact. Radiofrequency lesioning (ablation):  Radiofrequency Generator: NeuroTherm [id number] Sensory Stimulation Parameters: 50 Hz was used to locate & identify the nerve, making sure that the needle was positioned such that there was no sensory stimulation below 0.3 V or above 0.7 V. Motor Stimulation Parameters: 2 Hz was used to evaluate the motor component. Care was taken not to lesion any nerves that demonstrated motor stimulation of the lower extremities at an output of less than 2.5 times that of the sensory threshold, or a maximum of 2.0 V. Lesioning Technique Parameters: Standard Radiofrequency settings. (Not bipolar or pulsed.) Temperature Settings: 80 degrees C Lesioning time: 60 seconds Intra-operative Compliance: Compliant Materials & Medications: Needle(s) (Electrode/Cannula) Type: Teflon-coated, curved tip, Radiofrequency needle(s) Gauge: 22G Length: 10cm Numbing solution: 0.2% PF-Ropivacaine + Triamcinolone (40 mg/mL) diluted to a final concentration of 4 mg of Triamcinolone/mL of Ropivacaine The unused portion of the solution was discarded in the proper designated containers.  Once the entire procedure was completed, the treated area was cleaned, making sure to leave some of the prepping solution back to take advantage of its long term bactericidal properties.  Intra-operative Compliance: Compliant  Vitals:   03/28/18 1140 03/28/18 1150 03/28/18 1159 03/28/18 1209  BP: 125/76 120/67 138/87 (!) 148/99  Pulse: 69     Resp: 15 12 14 16   Temp:      TempSrc:      SpO2: 96% 98% 100% 100%  Weight:      Height:        Start Time: 1110 hrs. End Time:   hrs.  Imaging Guidance (Spinal):  Type of Imaging Technique: Fluoroscopy Guidance (Spinal) Indication(s): Assistance in needle guidance and placement for procedures requiring needle placement in or near specific anatomical locations not easily accessible without such assistance. Exposure Time: Please see nurses  notes. Contrast: None used. Fluoroscopic Guidance: I was personally present during the use of fluoroscopy. "Tunnel Vision Technique" used to obtain the best possible view of the target  area. Parallax error corrected before commencing the procedure. "Direction-depth-direction" technique used to introduce the needle under continuous pulsed fluoroscopy. Once target was reached, antero-posterior, oblique, and lateral fluoroscopic projection used confirm needle placement in all planes. Images permanently stored in EMR. Interpretation: No contrast injected. I personally interpreted the imaging intraoperatively. Adequate needle placement confirmed in multiple planes. Permanent images saved into the patient's record.  Antibiotic Prophylaxis:   Anti-infectives (From admission, onward)   None     Indication(s): None identified  Post-operative Assessment:  Post-procedure Vital Signs:  Pulse/HCG Rate: 6968 Temp: 98.3 F (36.8 C) Resp: 16 BP: (!) 148/99 SpO2: 100 %  EBL: None  Complications: No immediate post-treatment complications observed by team, or reported by patient.  Note: The patient tolerated the entire procedure well. A repeat set of vitals were taken after the procedure and the patient was kept under observation following institutional policy, for this type of procedure. Post-procedural neurological assessment was performed, showing return to baseline, prior to discharge. The patient was provided with post-procedure discharge instructions, including a section on how to identify potential problems. Should any problems arise concerning this procedure, the patient was given instructions to immediately contact us, at any time, without hesitation. In any case, we plan to contact the patient by telephone for a follow-up status report regarding this interventional procedure.  Comments:  No additional relevant information.  Plan of Care    Imaging Orders     DG C-Arm 1-60 Min-No Report      DG Thoracic Spine 2 View  Procedure Orders     Radiofrequency,Cervical     Radiofrequency,Cervical  Medications ordered for procedure: Meds ordered this encounter  Medications  . lidocaine (XYLOCAINE) 2 % (with pres) injection 400 mg  . midazolam (VERSED) 5 MG/5ML injection 1-2 mg    Make sure Flumazenil is available in the pyxis when using this medication. If oversedation occurs, administer 0.2 mg IV over 15 sec. If after 45 sec no response, administer 0.2 mg again over 1 min; may repeat at 1 min intervals; not to exceed 4 doses (1 mg)  . fentaNYL (SUBLIMAZE) injection 25-50 mcg    Make sure Narcan is available in the pyxis when using this medication. In the event of respiratory depression (RR< 8/min): Titrate NARCAN (naloxone) in increments of 0.1 to 0.2 mg IV at 2-3 minute intervals, until desired degree of reversal.  . lactated ringers infusion 1,000 mL  . ropivacaine (PF) 2 mg/mL (0.2%) (NAROPIN) injection 9 mL  . dexamethasone (DECADRON) injection 10 mg  . HYDROcodone-acetaminophen (NORCO/VICODIN) 5-325 MG tablet    Sig: Take 1 tablet by mouth every 6 (six) hours as needed for up to 7 days for moderate pain.    Dispense:  28 tablet    Refill:  0    For acute post-operative pain. Not to be refilled. To last 7 days.   Medications administered: We administered lidocaine, midazolam, fentaNYL, lactated ringers, ropivacaine (PF) 2 mg/mL (0.2%), and dexamethasone.  See the medical record for exact dosing, route, and time of administration.  New Prescriptions   HYDROCODONE-ACETAMINOPHEN (NORCO/VICODIN) 5-325 MG TABLET    Take 1 tablet by mouth every 6 (six) hours as needed for up to 7 days for moderate pain.   Disposition: Discharge home  Discharge Date & Time: 03/28/2018; 1210 hrs.   Physician-requested Follow-up: Return for contralateral RFA (2 wks): (R) C-FCT RFA.  Future Appointments  Date Time Provider [place]  05/17/2018  9:00 AM Caryl Bis, Angela Adam, MD [id number]  05/20/2018  9:00 AM Vevelyn Francois, NP [facility] None  10/22/2018 10:00 AM Harlin Heys, MD [facility] None   Primary Care Physician: Leone Haven, MD Location: [facility] Note by: Gaspar Cola, MD Date: 03/28/2018; Time: 12:20 PM  Disclaimer:  Medicine is not an Chief Strategy Officer. The only guarantee in medicine is that nothing is guaranteed. It is important to note that the decision to proceed with this intervention was based on the information collected from the patient. The Data and conclusions were drawn from the patient's questionnaire, the interview, and the physical examination. Because the information was provided in large part by the patient, it cannot be guaranteed that it has not been purposely or unconsciously manipulated. Every effort has been made to obtain as much relevant data as possible for this evaluation. It is important to note that the conclusions that lead to this procedure are derived in large part from the available data. Always take into account that the treatment will also be dependent on availability of resources and existing treatment guidelines, considered by other Pain Management Practitioners as being common knowledge and practice, at the time of the intervention. For Medico-Legal purposes, it is also important to point out that variation in procedural techniques and pharmacological choices are the acceptable norm. The indications, contraindications, technique, and results of the above procedure should only be interpreted and judged by a Board-Certified Interventional Pain Specialist with extensive familiarity and expertise in the same exact procedure and technique.

## 2018-03-29 ENCOUNTER — Ambulatory Visit
Admission: RE | Admit: 2018-03-29 | Discharge: 2018-03-29 | Disposition: A | Discharge status: Home / Self Care | Payer: BLUE CROSS/BLUE SHIELD | Source: Ambulatory Visit | Attending: Pain Medicine | Admitting: Pain Medicine

## 2018-03-29 ENCOUNTER — Telehealth: Payer: Self-pay

## 2018-03-29 DIAGNOSIS — M5414 Radiculopathy, thoracic region: Secondary | ICD-10-CM | POA: Insufficient documentation

## 2018-03-29 DIAGNOSIS — G8929 Other chronic pain: Secondary | ICD-10-CM | POA: Diagnosis present

## 2018-03-29 DIAGNOSIS — M546 Pain in thoracic spine: Secondary | ICD-10-CM | POA: Diagnosis present

## 2018-03-29 DIAGNOSIS — M47894 Other spondylosis, thoracic region: Secondary | ICD-10-CM | POA: Diagnosis not present

## 2018-03-29 NOTE — Telephone Encounter (Signed)
Post procedure phone call.  Patient state she is doing good, but is a little sore.  Instructed to put heat on it today .

## 2018-04-02 ENCOUNTER — Telehealth: Payer: Self-pay | Admitting: Pain Medicine

## 2018-04-02 NOTE — Telephone Encounter (Signed)
Patient is still having numbness in her head since procedure, is this normal

## 2018-04-03 NOTE — Telephone Encounter (Signed)
Attempted to reach patient, unable to make connection

## 2018-04-03 NOTE — Telephone Encounter (Signed)
Patient still experiencing numbness above ear and back of head. Please call

## 2018-04-03 NOTE — Telephone Encounter (Signed)
Attempted to reach patient twice lost connection both times.

## 2018-04-04 NOTE — Telephone Encounter (Signed)
Patient advise to get cardiac eval to r/o cardiac problem. Offered appointment wit Dr. Dossie Arbour or Ms. Edison Pace for next week, agrees to to that.

## 2018-04-04 NOTE — Telephone Encounter (Signed)
Having numbness on left side of the head since l. cervical RF on 03-28-18. Also has burning sensation . Has gotten some better, but is concerned because the symptoms are still there.

## 2018-04-04 NOTE — Telephone Encounter (Signed)
Please make the patient aware that numbness on the left is always and concern and cardiac symptoms should always be evaluated in the ER .  She will need an apt. If it is related to RFA .Have her set up with Dr Delane Ginger or myself for next week when he is in the office.

## 2018-04-07 NOTE — Progress Notes (Signed)
Patient's Name: Nicole Walter  MRN: [id number]  Referring Provider: Leone Haven, MD  DOB: 08/27/1961  PCP: Leone Haven, MD  DOS: 04/08/2018  Note by: Gaspar Cola, MD  Service setting: Ambulatory outpatient  Specialty: Interventional Pain Management  Location: [facility] (AMB) Pain Management Facility    Patient type: Established   Primary Reason(s) for Visit: Encounter for post-procedure evaluation of chronic illness with mild to moderate exacerbation CC: Headache and Neck Pain  HPI  Nicole Walter is a 57 y.o. year old, female patient, who comes today for a post-procedure evaluation. She has Anxiety and depression; Chronic pain syndrome; Lumbar spondylosis; Combined fat and carbohydrate induced hyperlipemia; Encounter for general adult medical examination with abnormal findings; Thoracic back pain; Chronic neck pain (Primary Source of Pain) (Bilateral) (L>R); Concentration deficit; Hyperlipidemia; Cervical spondylosis (Bilateral); Fibromyalgia; Osteopenia; Skin lesions; Chronic hip pain (Bilateral) (L>R); Bursitis of right shoulder; Chronic Greater trochanteric bursitis (Bilateral) (L>R); Long term current use of opiate analgesic; Long term prescription opiate use; Opiate use; Chronic upper back pain (Secondary source of pain) (Bilateral) (L>R); Chronic shoulder pain [facility] source of pain) (Bilateral) (L>R); Chronic knee pain (Right); Chronic upper extremity pain (Left); Chronic lower extremity pain (Bilateral) (L>R); Chronic abdominal pain; Elevated sedimentation rate; Elevated C-reactive protein (CRP); Overweight (BMI 25.0-29.9); Greater trochanteric bursitis (Right); Subacromial bursitis of shoulder joint (Right); Osteoarthritis of hip (Bilateral) (L>R); Thoracic radiculitis (R>L); Thoracic spondylosis; Shortness of breath; Osteoarthritis of shoulder (Right); Osteoarthritis of knee (Right); Chronic knee arthropathy (Right); Chronic right hip pain; Muscle spasm; Myofascial pain syndrome,  cervical (trapezius) (Left); Cervical facet syndrome (Bilateral) (L>R); Constipation; Chronic low back pain (Bilateral) (L>R); Cervical Foraminal Stenosis (Severe) (Bilateral: C-5-6); Cervical spondylosis with radiculopathy (Bilateral) (L>R); Cervical sensory radiculopathy at C5 (Bilateral); Radicular pain of shoulder (Bilateral) (C5); Chronic pelvic pain in female; Fatty liver; Spondylosis without myelopathy or radiculopathy, cervical region; Laceration of hand; Cervicalgia; and Acute post-operative pain on their problem list. Her primarily concern today is the Headache and Neck Pain  Pain Assessment: Location: Left Head Radiating: radiates into neck on left side Onset: More than a month ago Duration: Chronic pain Quality: Numbness, Tingling, Aching, Throbbing Severity: 1 /10 (subjective, self-reported pain score)  Note: Reported level is compatible with observation.                         When using our objective Pain Scale, levels between 6 and 10/10 are said to belong in an emergency room, as it progressively worsens from a 6/10, described as severely limiting, requiring emergency care not usually available at an outpatient pain management facility. At a 6/10 level, communication becomes difficult and requires great effort. Assistance to reach the emergency department may be required. Facial flushing and profuse sweating along with potentially dangerous increases in heart rate and blood pressure will be evident. Timing: Constant Modifying factors: nothing BP: 127/79  HR: 68  Nicole Walter comes in today for post-procedure evaluation after the treatment done on 04/02/2018.  Further details on both, my assessment(s), as well as the proposed treatment plan, please see below.  Post-Procedure Assessment  04/02/2018 Procedure: Therapeutic left-sided cervical facet RFA #1  under fluoroscopic guidance and IV sedation Pre-procedure pain score:  2/10 Post-procedure pain score: 0/10 (100%  relief) Influential Factors: BMI: 25.75 kg/m Intra-procedural challenges: None observed.         Assessment challenges: None detected.              Reported side-effects: None.  Post-procedural adverse reactions or complications: None reported         Sedation: Sedation provided. When no sedatives are used, the analgesic levels obtained are directly associated to the effectiveness of the local anesthetics. However, when sedation is provided, the level of analgesia obtained during the initial 1 hour following the intervention, is believed to be the result of a combination of factors. These factors may include, but are not limited to: 1. The effectiveness of the local anesthetics used. 2. The effects of the analgesic(s) and/or anxiolytic(s) used. 3. The degree of discomfort experienced by the patient at the time of the procedure. 4. The patients ability and reliability in recalling and recording the events. 5. The presence and influence of possible secondary gains and/or psychosocial factors. Reported result: Relief experienced during the 1st hour after the procedure: 100 % (Ultra-Short Term Relief)            Interpretative annotation: Clinically appropriate result. Analgesia during this period is likely to be Local Anesthetic and/or IV Sedative (Analgesic/Anxiolytic) related.          Effects of local anesthetic: The analgesic effects attained during this period are directly associated to the localized infiltration of local anesthetics and therefore cary significant diagnostic value as to the etiological location, or anatomical origin, of the pain. Expected duration of relief is directly dependent on the pharmacodynamics of the local anesthetic used. Long-acting (4-6 hours) anesthetics used.  Reported result: Relief during the next 4 to 6 hour after the procedure: 50 % (Short-Term Relief)            Interpretative annotation: Clinically appropriate result. Analgesia during this period is  likely to be Local Anesthetic-related.          Long-term benefit: Defined as the period of time past the expected duration of local anesthetics (1 hour for short-acting and 4-6 hours for long-acting). With the possible exception of prolonged sympathetic blockade from the local anesthetics, benefits during this period are typically attributed to, or associated with, other factors such as analgesic sensory neuropraxia, antiinflammatory effects, or beneficial biochemical changes provided by agents other than the local anesthetics.  Reported result: Extended relief following procedure: (has only been 9 days since procedure) (Long-Term Relief)            Interpretative annotation: Clinically appropriate result. Good relief. No permanent benefit expected. Inflammation plays a part in the etiology to the pain.          Current benefits: Defined as reported results that persistent at this point in time.   Analgesia: 50-75 % Nicole Walter reports improvement of axial symptoms. Function: Somewhat improved ROM: Somewhat improved Interpretative annotation: Recurrence of symptoms. No permanent benefit expected. Effective diagnostic intervention.          Interpretation: Results would suggest a successful diagnostic intervention.                  Plan:  Proceed with treatment No.: 1, on opposite side.          Laboratory Chemistry  Inflammation Markers (CRP: Acute Phase) (ESR: Chronic Phase) Lab Results  Component Value Date   CRP 2.1 (H) 07/20/2016   ESRSEDRATE 2 05/15/2017                         Renal Markers Lab Results  Component Value Date   BUN 12 10/24/2017   CREATININE 0.80 10/24/2017   GFRAA >60 07/20/2016   GFRNONAA >60 07/20/2016  Hepatic Markers Lab Results  Component Value Date   AST 14 10/24/2017   ALT 11 10/24/2017   ALBUMIN 4.2 10/24/2017   HCVAB NEGATIVE 02/14/2017                        Neuropathy Markers Lab Results  Component Value Date    HGBA1C 5.3 10/24/2017                        Hematology Parameters Lab Results  Component Value Date   PLT 262.0 10/24/2017   HGB 13.2 10/24/2017   HCT 40.2 10/24/2017                        CV Markers Lab Results  Component Value Date   TROPONINI <0.03 07/05/2016                         Note: Lab results reviewed.  Recent Diagnostic Imaging Results  DG Thoracic Spine 2 View CLINICAL DATA:  Chronic thoracic back pain.  EXAM: THORACIC SPINE 2 VIEWS  COMPARISON:  CT chest dated November 28, 2017.  FINDINGS: Twelve rib-bearing thoracic vertebral bodies. Slight levocurvature of the upper thoracic spine. No acute fracture or subluxation. Vertebral body heights are preserved. Alignment is normal. Scattered mild anterior endplate spurring throughout the thoracic spine. Intervertebral disc spaces are maintained.  IMPRESSION: 1.  No acute osseous abnormality.  Mild degenerative changes.  Electronically Signed   By: Titus Dubin M.D.   On: 03/29/2018 17:50  Complexity Note: I personally reviewed the fluoroscopic imaging of the procedure.                        Meds   Current Outpatient Medications:  .  Calcium Carbonate-Vit D-Min (CALCIUM 1200 PO), Take by mouth., Disp: , Rfl:  .  Cholecalciferol (VITAMIN D) 2000 units tablet, Take 4,000 Units by mouth daily., Disp: , Rfl:  .  fluocinonide ointment (LIDEX) 0.05 %, APPLY TOPICALLY TWICE A DAY TO ITCHY AREAS ONLY. as needed, Disp: , Rfl: 0 .  fluticasone (FLONASE) 50 MCG/ACT nasal spray, instill 2 sprays into each nostril once daily, Disp: 16 g, Rfl: 11 .  gabapentin (NEURONTIN) 300 MG capsule, Take 1-3 capsules (300-900 mg total) by mouth 4 (four) times daily. Follow the written titration instructions., Disp: 360 capsule, Rfl: 0 .  ibuprofen (ADVIL,MOTRIN) 800 MG tablet, Take 1 tablet (800 mg total) by mouth every 8 (eight) hours as needed., Disp: 30 tablet, Rfl: 0 .  linaclotide (LINZESS) 145 MCG CAPS capsule, Take 145  mcg by mouth daily before breakfast., Disp: , Rfl:  .  LORazepam (ATIVAN) 1 MG tablet, take 1 tablet by mouth twice a day if needed for anxiety ----DO NOT DRINK ALCOHOL WHILE TAKING THIS MEDICATION----, Disp: 30 tablet, Rfl: 1 .  Multiple Vitamin (MULTI-VITAMINS) TABS, Take 1 tablet by mouth daily. , Disp: , Rfl:  .  Omega-3 Fatty Acids (FISH OIL) 1200 MG CAPS, Take by mouth., Disp: , Rfl:  .  [START ON 05/08/2018] oxyCODONE (OXY IR/ROXICODONE) 5 MG immediate release tablet, Take 1 tablet (5 mg total) by mouth every 6 (six) hours as needed for severe pain., Disp: 120 tablet, Rfl: 0 .  oxyCODONE (OXY IR/ROXICODONE) 5 MG immediate release tablet, Take 1 tablet (5 mg total) by mouth every 6 (six) hours as needed for severe pain., Disp: 120  tablet, Rfl: 0 .  oxyCODONE (OXY IR/ROXICODONE) 5 MG immediate release tablet, Take 1 tablet (5 mg total) by mouth every 6 (six) hours as needed for severe pain., Disp: 120 tablet, Rfl: 0 .  oxyCODONE-acetaminophen (PERCOCET) 5-325 MG tablet, Take 1 tablet by mouth every 6 (six) hours as needed for up to 7 days for severe pain., Disp: 28 tablet, Rfl: 0 .  RABEprazole (ACIPHEX) 20 MG tablet, Take 20 mg by mouth as needed. , Disp: , Rfl:  .  sucralfate (CARAFATE) 1 g tablet, take 1 tablet by mouth three times a day if needed for abdominal pain, Disp: , Rfl: 0 .  vortioxetine HBr (TRINTELLIX) 5 MG TABS tablet, Take 3 tablets (15 mg total) by mouth daily., Disp: 90 tablet, Rfl: 3  ROS  Constitutional: Denies any fever or chills Gastrointestinal: No reported hemesis, hematochezia, vomiting, or acute GI distress Musculoskeletal: Denies any acute onset joint swelling, redness, loss of ROM, or weakness Neurological: No reported episodes of acute onset apraxia, aphasia, dysarthria, agnosia, amnesia, paralysis, loss of coordination, or loss of consciousness  Allergies  Nicole Walter is allergic to other and food.  PFSH  Drug: Nicole Walter  reports that she does not use  drugs. Alcohol:  reports that she does not drink alcohol. Tobacco:  reports that she quit smoking about 2 years ago. She started smoking about 44 years ago. She has a 35.00 pack-year smoking history. She has never used smokeless tobacco. Medical:  has a past medical history of Abnormal liver enzymes (02/08/2016), Allergy, Anxiety, Chest pain (07/05/2016), Chronic pain, Depression, DJD (degenerative joint disease), Fibromyalgia, High cholesterol, Rash of back (01/28/2016), and Weight loss due to medication. Surgical: Nicole Walter  has a past surgical history that includes Cholecystectomy; Tubal ligation; Ankle surgery; Nasal sinus surgery; Tonsillectomy; Colonoscopy; Esophagogastroduodenoscopy (egd) with propofol (N/A, 02/22/2016); Colonoscopy with propofol (N/A, 02/22/2016); and Breast biopsy (Left, 1997). Family: family history includes Arthritis in her father, paternal grandfather, and paternal grandmother; Cancer in her brother and father; Dementia in her sister; Diabetes in her sister; Drug abuse in her brother, brother, and sister; Heart disease in her mother; Post-traumatic stress disorder in her brother; Stroke in her mother.  Constitutional Exam  General appearance: Well nourished, well developed, and well hydrated. In no apparent acute distress Vitals:   04/08/18 0801  BP: 127/79  Pulse: 68  Resp: 18  Temp: 98.1 F (36.7 C)  SpO2: 100%  Weight: 150 lb (68 kg)  Height: '5\' 4"'$  (1.626 m)   BMI Assessment: Estimated body mass index is 25.75 kg/m as calculated from the following:   Height as of this encounter: '5\' 4"'$  (1.626 m).   Weight as of this encounter: 150 lb (68 kg).  BMI interpretation table: BMI level Category Range association with higher incidence of chronic pain  <18 kg/m2 Underweight   18.5-24.9 kg/m2 Ideal body weight   25-29.9 kg/m2 Overweight Increased incidence by 20%  30-34.9 kg/m2 Obese (Class I) Increased incidence by 68%  35-39.9 kg/m2 Severe obesity (Class II) Increased  incidence by 136%  >40 kg/m2 Extreme obesity (Class III) Increased incidence by 254%   Patient's current BMI Ideal Body weight  Body mass index is 25.75 kg/m. Ideal body weight: 54.7 kg (120 lb 9.5 oz) Adjusted ideal body weight: 60 kg (132 lb 5.7 oz)   BMI Readings from Last 4 Encounters:  04/08/18 25.75 kg/m  03/28/18 25.75 kg/m  02/18/18 25.23 kg/m  02/12/18 25.37 kg/m   Wt Readings from Last 4 Encounters:  04/08/18 150 lb (68 kg)  03/28/18 150 lb (68 kg)  02/18/18 147 lb (66.7 kg)  02/12/18 147 lb 12.8 oz (67 kg)  Psych/Mental status: Alert, oriented x 3 (person, place, & time)       Eyes: PERLA Respiratory: No evidence of acute respiratory distress  Cervical Spine Area Exam  Skin & Axial Inspection: No masses, redness, edema, swelling, or associated skin lesions Alignment: Symmetrical Functional ROM: Pain restricted ROM, bilaterally Stability: No instability detected Muscle Tone/Strength: Functionally intact. No obvious neuro-muscular anomalies detected. Sensory (Neurological): Allodynia (Painful response to non-painful stimuli) over the left occipital scalp region Palpation: Complains of area being tender to palpation            (same as above)  Upper Extremity (UE) Exam    Side: Right upper extremity  Side: Left upper extremity  Skin & Extremity Inspection: Skin color, temperature, and hair growth are WNL. No peripheral edema or cyanosis. No masses, redness, swelling, asymmetry, or associated skin lesions. No contractures.  Skin & Extremity Inspection: Skin color, temperature, and hair growth are WNL. No peripheral edema or cyanosis. No masses, redness, swelling, asymmetry, or associated skin lesions. No contractures.  Functional ROM: Unrestricted ROM          Functional ROM: Unrestricted ROM          Muscle Tone/Strength: Functionally intact. No obvious neuro-muscular anomalies detected.  Muscle Tone/Strength: Functionally intact. No obvious neuro-muscular anomalies  detected.  Sensory (Neurological): Unimpaired          Sensory (Neurological): Unimpaired          Palpation: No palpable anomalies              Palpation: No palpable anomalies              Provocative Test(s):  Phalen's test: deferred Tinel's test: deferred Apley's scratch test (touch opposite shoulder):  Action 1 (Across chest): deferred Action 2 (Overhead): deferred Action 3 (LB reach): deferred   Provocative Test(s):  Phalen's test: deferred Tinel's test: deferred Apley's scratch test (touch opposite shoulder):  Action 1 (Across chest): deferred Action 2 (Overhead): deferred Action 3 (LB reach): deferred    Thoracic Spine Area Exam  Skin & Axial Inspection: No masses, redness, or swelling Alignment: Symmetrical Functional ROM: Unrestricted ROM Stability: No instability detected Muscle Tone/Strength: Functionally intact. No obvious neuro-muscular anomalies detected. Sensory (Neurological): Unimpaired Muscle strength & Tone: No palpable anomalies  Lumbar Spine Area Exam  Skin & Axial Inspection: No masses, redness, or swelling Alignment: Symmetrical Functional ROM: Unrestricted ROM       Stability: No instability detected Muscle Tone/Strength: Functionally intact. No obvious neuro-muscular anomalies detected. Sensory (Neurological): Unimpaired Palpation: No palpable anomalies       Provocative Tests: Lumbar Hyperextension/rotation test: deferred today       Lumbar quadrant test (Kemp's test): deferred today       Lumbar Lateral bending test: deferred today       Patrick's Maneuver: deferred today                   FABER test: deferred today       Thigh-thrust test: deferred today       S-I compression test: deferred today       S-I distraction test: deferred today        Gait & Posture Assessment  Ambulation: Unassisted Gait: Relatively normal for age and body habitus Posture: WNL   Lower Extremity Exam    Side: Right lower  extremity  Side: Left lower extremity   Stability: No instability observed          Stability: No instability observed          Skin & Extremity Inspection: Skin color, temperature, and hair growth are WNL. No peripheral edema or cyanosis. No masses, redness, swelling, asymmetry, or associated skin lesions. No contractures.  Skin & Extremity Inspection: Skin color, temperature, and hair growth are WNL. No peripheral edema or cyanosis. No masses, redness, swelling, asymmetry, or associated skin lesions. No contractures.  Functional ROM: Unrestricted ROM                  Functional ROM: Unrestricted ROM                  Muscle Tone/Strength: Functionally intact. No obvious neuro-muscular anomalies detected.  Muscle Tone/Strength: Functionally intact. No obvious neuro-muscular anomalies detected.  Sensory (Neurological): Unimpaired  Sensory (Neurological): Unimpaired  Palpation: No palpable anomalies  Palpation: No palpable anomalies   Assessment  Primary Diagnosis & Pertinent Problem List: The primary encounter diagnosis was Spondylosis without myelopathy or radiculopathy, cervical region. Diagnoses of Cervical facet syndrome (Bilateral) (L>R), Cervicalgia, and Acute post-operative pain were also pertinent to this visit.  Status Diagnosis  Improving Improving Improving 1. Spondylosis without myelopathy or radiculopathy, cervical region   2. Cervical facet syndrome (Bilateral) (L>R)   3. Cervicalgia   4. Acute post-operative pain     Problems updated and reviewed during this visit: Problem  Acute Post-Operative Pain  Chronic Pelvic Pain in Female  Muscle Spasm  Chronic Right Hip Pain  Constipation  Laceration of Hand  Fatty Liver  Shortness of Breath  Encounter for General Adult Medical Examination With Abnormal Findings   Plan of Care  Pharmacotherapy (Medications Ordered): Meds ordered this encounter  Medications  . gabapentin (NEURONTIN) 300 MG capsule    Sig: Take 1-3 capsules (300-900 mg total) by mouth 4 (four)  times daily. Follow the written titration instructions.    Dispense:  360 capsule    Refill:  0    Do not place this medication, or any other prescription from our practice, on "Automatic Refill". Patient may have prescription filled one day early if pharmacy is closed on scheduled refill date.  Marland Kitchen oxyCODONE-acetaminophen (PERCOCET) 5-325 MG tablet    Sig: Take 1 tablet by mouth every 6 (six) hours as needed for up to 7 days for severe pain.    Dispense:  28 tablet    Refill:  0    For acute post-operative pain. Not to be refilled. To last 7 days.   Medications administered today: Nicole Walter had no medications administered during this visit.  Procedure Orders    No procedure(s) ordered today   Lab Orders  No laboratory test(s) ordered today   Imaging Orders  No imaging studies ordered today   Referral Orders  No referral(s) requested today    Interventional management options: Planned, scheduled, and/or pending:   Therapeutic Right-sided cervical facet RFA under fluoroscopic guidance and IV sedation   Considering:   Diagnostic bilateral cervical facet block Possible bilateral cervical facet radiofrequencyablation Diagnostic left-sided cervical epidural steroid injection Diagnostic bilateral intra-articular shoulder joint injection Diagnostic bilateral suprascapular nerve block Possible bilateral suprascapular nerve radiofrequencyablation Diagnostic bilateral lumbar facet block Possible bilateral lumbar facet radiofrequencyablation  Diagnostic bilateral intra-articular hip joint injection Diagnostic bilateral femoral and obturator tickler branch blocks Possible bilateral hip joint radiofrequencyablation  Diagnosticright intra-articular knee joint injection Possible series of 5, right-sided,  intra-articular knee joint injections with Hyalgan.  Possible right sided genicular nerve block Possible right sided genicular nerve radiofrequencyablation Possible  left-sided lumbar epidural steroid injection   Palliative PRN treatment(s):   Palliativebilateral suprascapular nerve block Palliativeright intra-articular knee joint injection   Provider-requested follow-up: Return for contralateral RFA (2 wks): (R) C-FCT BLK.  Future Appointments  Date Time Provider [place]  04/15/2018 10:30 AM Gardiner Barefoot, DPM [facility]  05/17/2018  9:00 AM Leone Haven, MD [facility]  05/20/2018  9:00 AM Vevelyn Francois, NP [facility] None  05/28/2018 10:30 AM Milinda Pointer, MD [facility] None  10/22/2018 10:00 AM Harlin Heys, MD [facility] None   Primary Care Physician: Leone Haven, MD Location: [facility] Note by: Gaspar Cola, MD Date: 04/08/2018; Time: 8:50 AM

## 2018-04-08 ENCOUNTER — Encounter: Payer: Self-pay | Admitting: Pain Medicine

## 2018-04-08 ENCOUNTER — Other Ambulatory Visit: Payer: Self-pay

## 2018-04-08 ENCOUNTER — Ambulatory Visit: Payer: BLUE CROSS/BLUE SHIELD | Attending: Pain Medicine | Admitting: Pain Medicine

## 2018-04-08 VITALS — BP 127/79 | HR 68 | Temp 98.1°F | Resp 18 | Ht 64.0 in | Wt 150.0 lb

## 2018-04-08 DIAGNOSIS — Z9851 Tubal ligation status: Secondary | ICD-10-CM | POA: Insufficient documentation

## 2018-04-08 DIAGNOSIS — R51 Headache: Secondary | ICD-10-CM | POA: Diagnosis present

## 2018-04-08 DIAGNOSIS — G894 Chronic pain syndrome: Secondary | ICD-10-CM | POA: Diagnosis not present

## 2018-04-08 DIAGNOSIS — Z9889 Other specified postprocedural states: Secondary | ICD-10-CM | POA: Diagnosis not present

## 2018-04-08 DIAGNOSIS — R102 Pelvic and perineal pain: Secondary | ICD-10-CM | POA: Diagnosis not present

## 2018-04-08 DIAGNOSIS — M546 Pain in thoracic spine: Secondary | ICD-10-CM | POA: Insufficient documentation

## 2018-04-08 DIAGNOSIS — F419 Anxiety disorder, unspecified: Secondary | ICD-10-CM | POA: Diagnosis not present

## 2018-04-08 DIAGNOSIS — M797 Fibromyalgia: Secondary | ICD-10-CM | POA: Insufficient documentation

## 2018-04-08 DIAGNOSIS — K5900 Constipation, unspecified: Secondary | ICD-10-CM | POA: Insufficient documentation

## 2018-04-08 DIAGNOSIS — Z87891 Personal history of nicotine dependence: Secondary | ICD-10-CM | POA: Insufficient documentation

## 2018-04-08 DIAGNOSIS — M25561 Pain in right knee: Secondary | ICD-10-CM | POA: Insufficient documentation

## 2018-04-08 DIAGNOSIS — M542 Cervicalgia: Secondary | ICD-10-CM | POA: Diagnosis not present

## 2018-04-08 DIAGNOSIS — K760 Fatty (change of) liver, not elsewhere classified: Secondary | ICD-10-CM | POA: Insufficient documentation

## 2018-04-08 DIAGNOSIS — M47812 Spondylosis without myelopathy or radiculopathy, cervical region: Secondary | ICD-10-CM

## 2018-04-08 DIAGNOSIS — M79605 Pain in left leg: Secondary | ICD-10-CM | POA: Insufficient documentation

## 2018-04-08 DIAGNOSIS — Z79891 Long term (current) use of opiate analgesic: Secondary | ICD-10-CM | POA: Diagnosis not present

## 2018-04-08 DIAGNOSIS — M47819 Spondylosis without myelopathy or radiculopathy, site unspecified: Secondary | ICD-10-CM | POA: Insufficient documentation

## 2018-04-08 DIAGNOSIS — R0602 Shortness of breath: Secondary | ICD-10-CM | POA: Insufficient documentation

## 2018-04-08 DIAGNOSIS — Z9049 Acquired absence of other specified parts of digestive tract: Secondary | ICD-10-CM | POA: Diagnosis not present

## 2018-04-08 DIAGNOSIS — G8918 Other acute postprocedural pain: Secondary | ICD-10-CM | POA: Insufficient documentation

## 2018-04-08 DIAGNOSIS — F329 Major depressive disorder, single episode, unspecified: Secondary | ICD-10-CM | POA: Insufficient documentation

## 2018-04-08 DIAGNOSIS — Z79899 Other long term (current) drug therapy: Secondary | ICD-10-CM | POA: Insufficient documentation

## 2018-04-08 MED ORDER — OXYCODONE-ACETAMINOPHEN 5-325 MG PO TABS: 1.0000 | ORAL_TABLET | Freq: Four times a day (QID) | ORAL | 0 refills | Status: AC | PRN

## 2018-04-08 MED ORDER — GABAPENTIN 300 MG PO CAPS: 300.0000 mg | ORAL_CAPSULE | Freq: Four times a day (QID) | ORAL | 0 refills | Status: DC

## 2018-04-08 NOTE — Patient Instructions (Addendum)
____________________________________________________________________________________________  Initial Gabapentin Titration  Medication used: Gabapentin (Generic Name) or Neurontin (Brand Name) 300 mg tablets/capsules  Reasons to stop increasing the dose:  Reason 1: You get good relief of symptoms, in which case there is no need to increase the daily dose any further.    Reason 2: You develop some side effects, such as sleeping all of the time, difficulty concentrating, or becoming disoriented, in which case you need to go down on the dose, to the prior level, where you were not experiencing any side effects. Stay on that dose longer, to allow more time for your body to get use it, before attempting to increase it again.   Steps: Step 1: Start by taking 1 (one) tablet at bedtime x 7 (seven) days.  Step 2: After being on 1 (one) tablet for 7 (seven) days, then increase it to 2 (two) tablets at bedtime for another 7 (seven) days.  Step 3: Next, after being on 2 (two) tablets at bedtime for 7 (seven) days, then increase it to 3 (three) tablets at bedtime, and stay on that dose until you see your doctor.  Reasons to stop increasing the dose: Reason 1: You get good relief of symptoms, in which case there is no need to increase the daily dose any further.  Reason 2: You develop some side effects, such as sleeping all of the time, difficulty concentrating, or becoming disoriented, in which case you need to go down on the dose, to the prior level, where you were not experiencing any side effects. Stay on that dose longer, to allow more time for your body to get use it, before attempting to increase it again.  Endpoint: Once you have reached the maximum dose you can tolerate without side-effects, contact your physician so as to evaluate the results of the regimen.   Questions: Feel free to contact us for any questions or problems at [phone] ____________________________________________________________________________________________  ____________________________________________________________________________________________  Gabapentin Titration  Medication used: Gabapentin (Generic Name) or Neurontin (Brand Name) 300 mg tablets/capsules  Reasons to stop increasing the dose:  Reason 1: You get good relief of symptoms, in which case there is no need to increase the daily dose any further.    Reason 2: You develop some side effects, such as sleeping all of the time, difficulty concentrating, or becoming disoriented, in which case you need to go down on the dose, to the prior level, where you were not experiencing any side effects. Stay on that dose longer, to allow more time for your body to get use it, before attempting to increase it again.   Reasons to stop increasing the dose: Reason 1: You get good relief of symptoms, in which case there is no need to increase the daily dose any further.  Reason 2: You develop some side effects, such as sleeping all of the time, difficulty concentrating, or becoming disoriented, in which case you need to go down on the dose, to the prior level, where you were not experiencing any side effects. Stay on that dose longer, to allow more time for your body to get use it, before attempting to increase it again.  Steps to increase medication: Step 1: Start by taking 1 (one) tablet at bedtime x 7 (seven) days.  Step 2: After 7 (seven) days of taking 1 (one) tablet at bedtime, increase it to 2 (two) tablets at bedtime. Stay on this dose x 7 (seven) days.  Step 3: After 7 (seven) days of taking  2 (two) tablets at bedtime, increase it to 3 (three) tablets at bedtime. Stay on this dose x another 7 (seven) days.  Step 4: After 7 (seven) days of taking 3 (three) tablet at bedtime, begin taking 1 (one) tablet at noon with lunch. Stay on this dose x another 7 (seven) days.  Step 5: After 7  (seven) days of taking 3 (three) tablet at bedtime, and 1 (one) tablet at noon, then begin taking 1 (one) tablet in the afternoon with dinner. Stay on this dose x another 7 (seven) days.  Step 6: After 7 (seven) days of taking 3 (three) tablet at bedtime, 1 (one) tablet at noon, and 1 (one) tablet in the afternoon, then begin taking 1 (one) tablet in the morning with breakfast. Stay on this dose x another 7 (seven) days. At this point you should be taking the medicine 4 (four) times a day, or about every 6 (six) hours. This daily regimen of taking the medicine 4 (four) times a day, will be maintained from now on. You should not take any doses any sooner than every 6 (six) hours.  Step 7: After 7 (seven) days of taking 3 (three) tablet at bedtime, 1 (one) tablet at noon, 1 (one) tablet in the afternoon, and 1 (one) tablet in the morning, begin taking 2 (two) tablets at noon with lunch. Stay on this dose x another 7 (seven) days.   Step 8: After 7 (seven) days of taking 3 (three) tablet at bedtime, 2 (two) tablets at noon, 1 (one) tablet in the afternoon, and 1 (one) tablet in the morning, begin taking 2 (two) tablets in the afternoon with dinner. Stay on this dose x another 7 (seven) days.   Step 9: After 7 (seven) days of taking 3 (three) tablet at bedtime, 2 (two) tablets at noon, 2 (two) tablets in the afternoon, and 1 (one) tablet in the morning, begin taking 2 (two) tablets in the morning with breakfast. Stay on this dose x another 7 (seven) days. At this point you should be taking the medicine 4 (four) times a day, or about every 6 (six) hours. This daily regimen of taking the medicine 4 (four) times a day, will be maintained from now on. You should not take any doses any sooner than every 6 (six) hours.  Step 10: After 7 (seven) days of taking 3 (three) tablet at bedtime, 2 (two) tablets at noon, 2 (two) tablets in the afternoon, and 2 (two) tablets in the morning, begin taking 3 (three) tablets at  noon with lunch. Stay on this dose x another 7 (seven) days.   Step 11: After 7 (seven) days of taking 3 (three) tablet at bedtime, 3 (three) tablets at noon, 2 (two) tablets in the afternoon, and 2 (two) tablets in the morning, begin taking 3 (three) tablets in the afternoon with dinner. Stay on this dose x another 7 (seven) days.   Step 12: After 7 (seven) days of taking 3 (three) tablet at bedtime, 3 (three) tablets at noon, 3 (three) tablets in the afternoon, and 2 (two) tablet in the morning, begin taking 3 (three) tablets in the morning with breakfast. Stay on this dose x another 7 (seven) days. At this point you should be taking the medicine 4 (four) times a day, or about every 6 (six) hours. This daily regimen of taking the medicine 4 (four) times a day, will be maintained from now on.   Endpoint: Once you have reached the maximum dose  you can tolerate without side-effects, contact your physician so as to evaluate the results of the regimen.   Questions: Feel free to contact us for any questions or problems at [phone] ____________________________________________________________________________________________  Radiofrequency Lesioning Radiofrequency lesioning is a procedure that is performed to relieve pain. The procedure is often used for back, neck, or arm pain. Radiofrequency lesioning involves the use of a machine that creates radio waves to make heat. During the procedure, the heat is applied to the nerve that carries the pain signal. The heat damages the nerve and interferes with the pain signal. Pain relief usually starts about 2 weeks after the procedure and lasts for 6 months to 1 year. Tell a health care provider about:  Any allergies you have.  All medicines you are taking, including vitamins, herbs, eye drops, creams, and over-the-counter medicines.  Any problems you or family members have had with anesthetic medicines.  Any blood disorders you have.  Any surgeries  you have had.  Any medical conditions you have.  Whether you are pregnant or may be pregnant. What are the risks? Generally, this is a safe procedure. However, problems may occur, including:  Pain or soreness at the injection site.  Infection at the injection site.  Damage to nerves or blood vessels.  What happens before the procedure?  Ask your health care provider about: ? Changing or stopping your regular medicines. This is especially important if you are taking diabetes medicines or blood thinners. ? Taking medicines such as aspirin and ibuprofen. These medicines can thin your blood. Do not take these medicines before your procedure if your health care provider instructs you not to.  Follow instructions from your health care provider about eating or drinking restrictions.  Plan to have someone take you home after the procedure.  If you go home right after the procedure, plan to have someone with you for 24 hours. What happens during the procedure?  You will be given one or more of the following: ? A medicine to help you relax (sedative). ? A medicine to numb the area (local anesthetic).  You will be awake during the procedure. You will need to be able to talk with the health care provider during the procedure.  With the help of a type of X-ray (fluoroscopy), the health care provider will insert a radiofrequency needle into the area to be treated.  Next, a wire that carries the radio waves (electrode) will be put through the radiofrequency needle. An electrical pulse will be sent through the electrode to verify the correct nerve. You will feel a tingling sensation, and you may have muscle twitching.  Then, the tissue that is around the needle tip will be heated by an electric current that is passed using the radiofrequency machine. This will numb the nerves.  A bandage (dressing) will be put on the insertion area after the procedure is done. The procedure may vary among  health care providers and hospitals. What happens after the procedure?  Your blood pressure, heart rate, breathing rate, and blood oxygen level will be monitored often until the medicines you were given have worn off.  Return to your normal activities as directed by your health care provider. This information is not intended to replace advice given to you by your health care provider. Make sure you discuss any questions you have with your health care provider. Document Released: 06/21/2011 Document Revised: 03/30/2016 Document Reviewed: 11/30/2014 Elsevier Interactive Patient Education  2018 [place] were given  one prescription for Gabapentin and one for Percocet today.

## 2018-04-15 ENCOUNTER — Encounter: Payer: Self-pay | Admitting: Podiatry

## 2018-04-15 ENCOUNTER — Ambulatory Visit (INDEPENDENT_AMBULATORY_CARE_PROVIDER_SITE_OTHER): Payer: BLUE CROSS/BLUE SHIELD | Admitting: Podiatry

## 2018-04-15 ENCOUNTER — Ambulatory Visit: Payer: BLUE CROSS/BLUE SHIELD | Admitting: Podiatry

## 2018-04-15 DIAGNOSIS — L603 Nail dystrophy: Secondary | ICD-10-CM | POA: Diagnosis not present

## 2018-04-15 NOTE — Progress Notes (Signed)
This patient returns to the office follow-up for nail surgery for the permanent permanent removal of toenails on both feet.  She says they have healed but she has developed nails that seem to be growing at the surgical nail site.  She says they are painful and required her to cut out the corners of the nail.  She presents the office today stating that she would like permanent correction of these nail borders on these multiple toes.  General Appearance  Alert, conversant and in no acute stress.  Vascular  Dorsalis pedis and posterior tibial  pulses are palpable  bilaterally.  Capillary return is within normal limits  bilaterally. Temperature is within normal limits  bilaterally.  Neurologic  Senn-Weinstein monofilament wire test within normal limits  bilaterally. Muscle power within normal limits bilaterally.  Nails nail spicules noted on the second and fourth toes, left foot and multiple toes on the right foot.  Orthopedic  No limitations of motion of motion feet .  No crepitus or effusions noted.  No bony pathology or digital deformities noted.  Skin  normotropic skin with no porokeratosis noted bilaterally.  No signs of infections or ulcers noted.   Nail Spicules  Nail Dystrophy.   ROV>  Patient has nail spicules growing from borders of both surgical sites.  Told this patient the conservative versus surgical treatment and she requests permanent surgical correction.  She was told to make an appointment at which time surgery will then be performed.   Gardiner Barefoot DPM

## 2018-04-17 ENCOUNTER — Other Ambulatory Visit: Payer: Self-pay | Admitting: Family Medicine

## 2018-04-17 NOTE — Telephone Encounter (Signed)
Last OV 02/12/18 last filled 03/11/18 30 1rf

## 2018-04-17 NOTE — Telephone Encounter (Signed)
Sent to pharmacy.  Drug database reviewed. 

## 2018-04-18 ENCOUNTER — Ambulatory Visit: Payer: BLUE CROSS/BLUE SHIELD | Admitting: Podiatry

## 2018-04-22 ENCOUNTER — Encounter: Payer: Self-pay | Admitting: Podiatry

## 2018-04-22 ENCOUNTER — Ambulatory Visit (INDEPENDENT_AMBULATORY_CARE_PROVIDER_SITE_OTHER): Payer: BLUE CROSS/BLUE SHIELD | Admitting: Podiatry

## 2018-04-22 DIAGNOSIS — L603 Nail dystrophy: Secondary | ICD-10-CM

## 2018-04-22 NOTE — Progress Notes (Signed)
This patient returns to the office follow-up for nail surgery for the permanent permanent removal of toenails on both feet.  CC presents to the office today for continued nail surgery for the removal of nail spicules on the second and fourth toes, left foot.  General Appearance  Alert, conversant and in no acute stress.  Vascular  Dorsalis pedis and posterior tibial  pulses are palpable  bilaterally.  Capillary return is within normal limits  bilaterally. Temperature is within normal limits  bilaterally.  Neurologic  Senn-Weinstein monofilament wire test within normal limits  bilaterally. Muscle power within normal limits bilaterally.  Nails nail spicules noted on the second and fourth toes, left foot and multiple toes on the right foot.  Orthopedic  No limitations of motion of motion feet .  No crepitus or effusions noted.  No bony pathology or digital deformities noted.  Skin  normotropic skin with no porokeratosis noted bilaterally.  No signs of infections or ulcers noted.   Nail Spicules  Nail Dystrophy.   ROV>  Patient has nail spicules growing from borders of both surgical sites.  Nail surgery was performed.  Attention was redirected to the second and fourth digits, left foot. The second and fourth toes were anesthetized with 2% Xylocaine plain The nail beds were cleansed with Betadine.  A toe tourniquet was applied to the second digit, left foot. A freer elevator was utilized to isolate the nail spicule on the second toe left foot. Using a hemostat the nail spicules were removed from the nail bed second toe.  The base of the nail spicules were  cauterized with phenol and washed with alcohol.  The tourniquet from the second toe was removed and was bandaged using Neosporin and dry sterile dressing and Coban.  Surgery was performed on the fourth toe left foot as described above.  She is to return to the office in 4 weeks to consider the nail spicules right foot. She was also told not to cut any  of the nail spicule remaining on the right toes.     Gardiner Barefoot DPM

## 2018-04-29 IMAGING — US US ABDOMEN LIMITED
1 series · 14 of 25 positions shown · non-contrast
Comparison: None.

CLINICAL DATA: Elevated liver function tests. Prior
cholecystectomy.

EXAM:
US ABDOMEN LIMITED - RIGHT UPPER QUADRANT

[Series 1: us abdomen limited · 0.26mm/px · 14 of 35 slices shown]
[im 1/35]
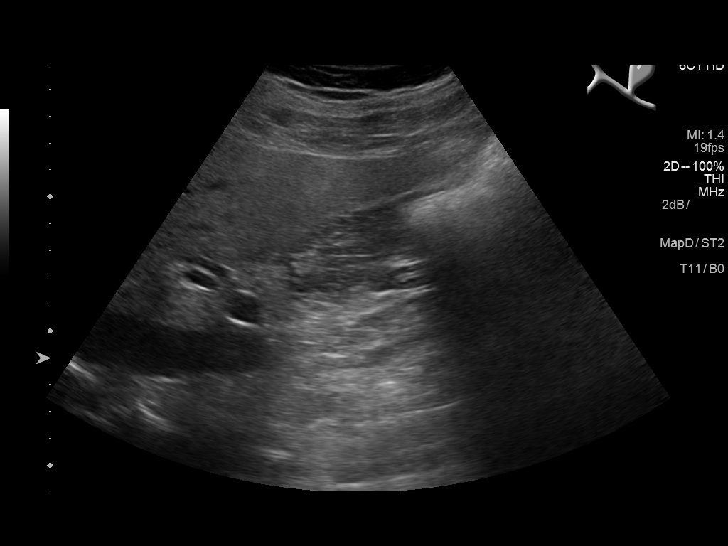
[im 3/35]
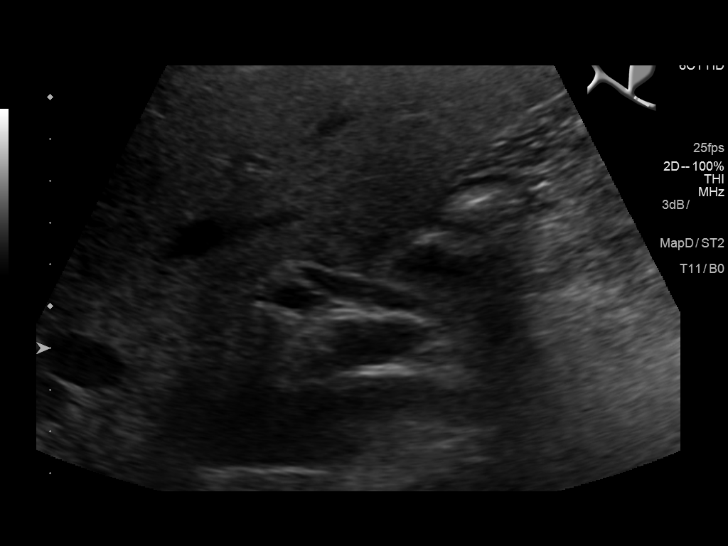
[im 6/35]
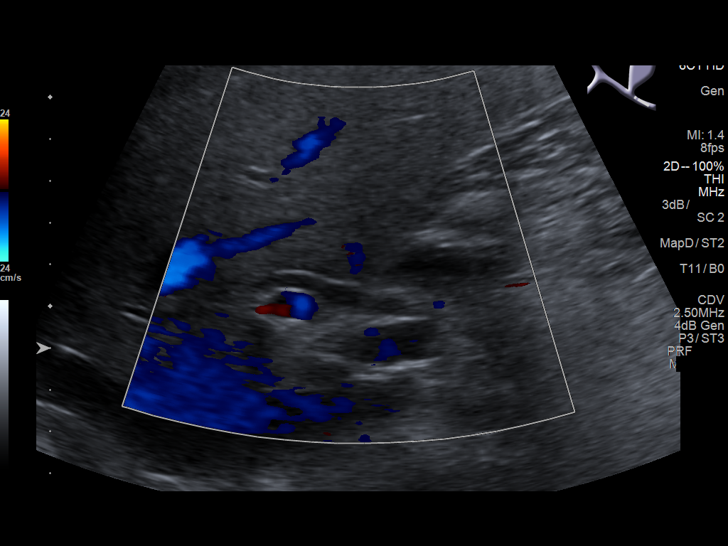
[im 9/35]
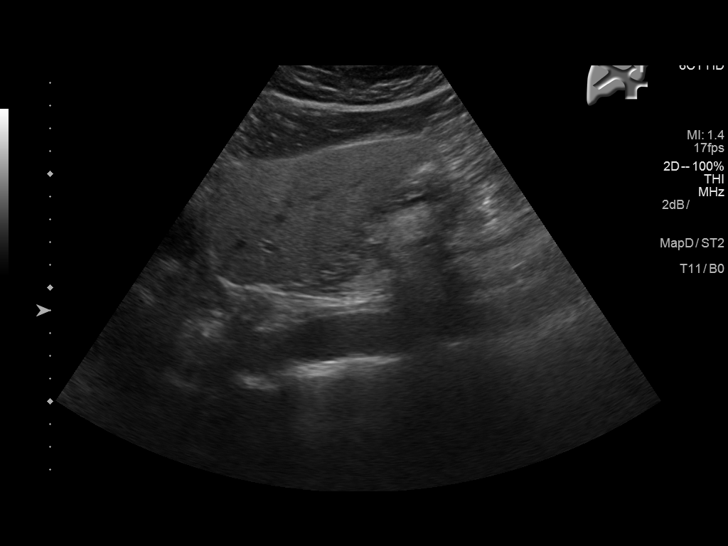
[im 12/35]
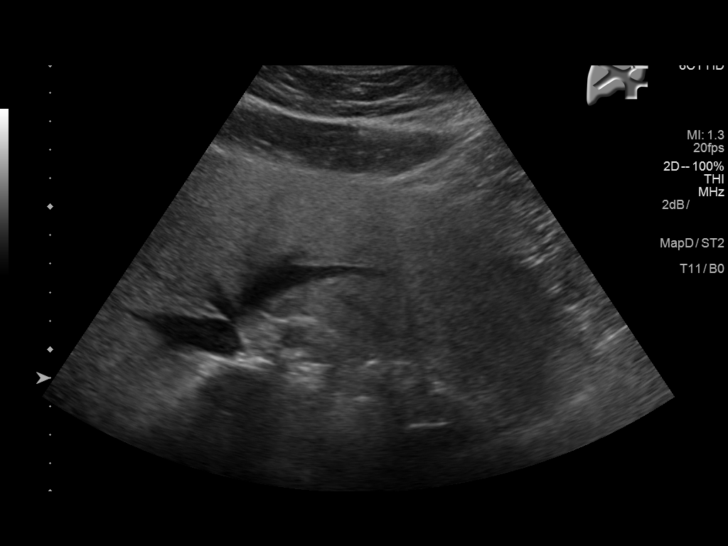
[im 13/35]
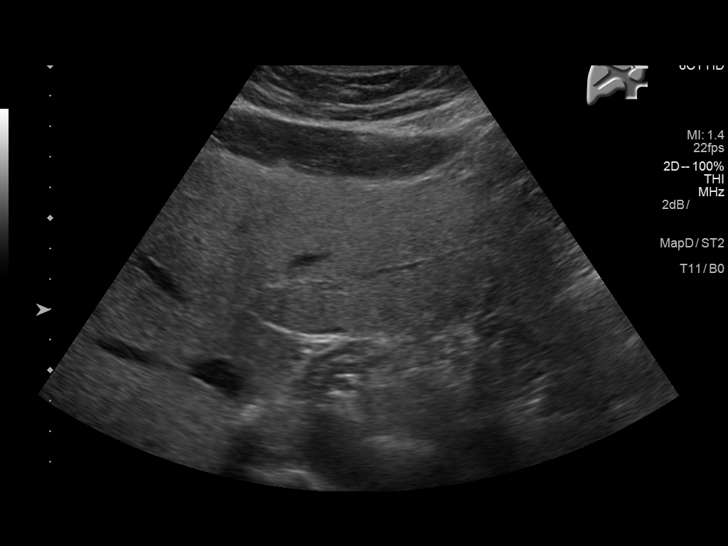
[im 16/35]
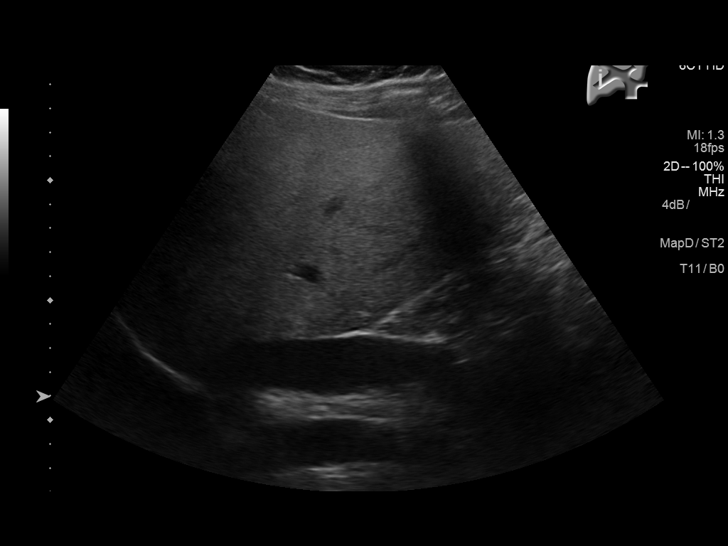
[im 19/35]
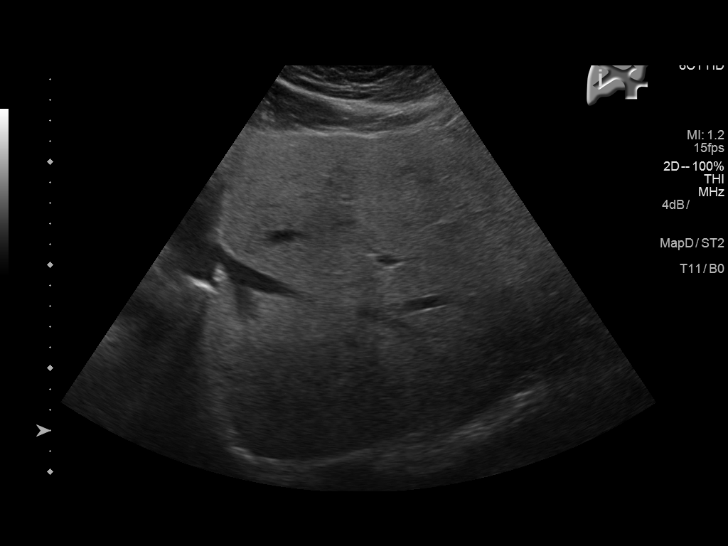
[im 22/35]
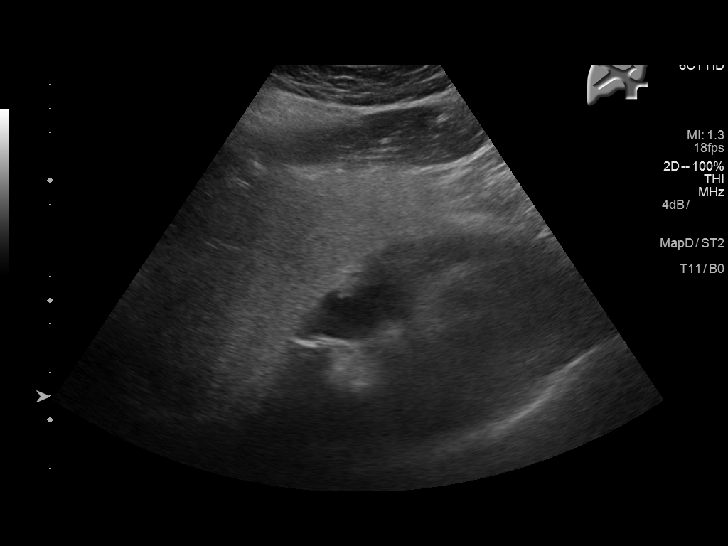
[im 23/35]
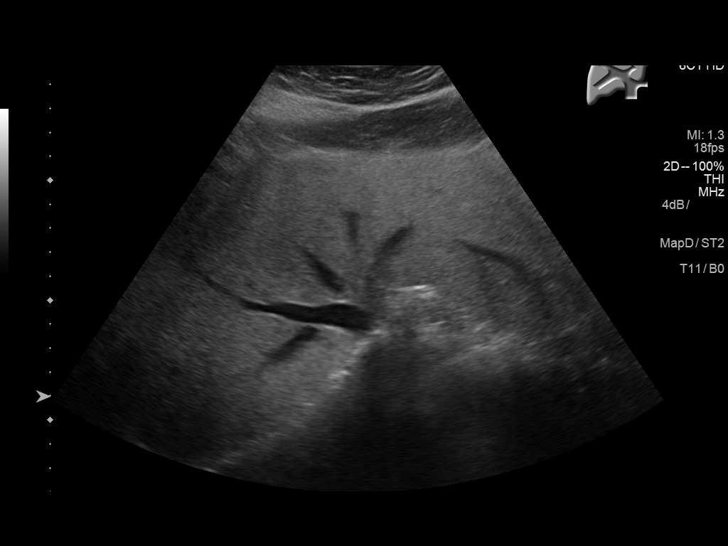
[im 26/35]
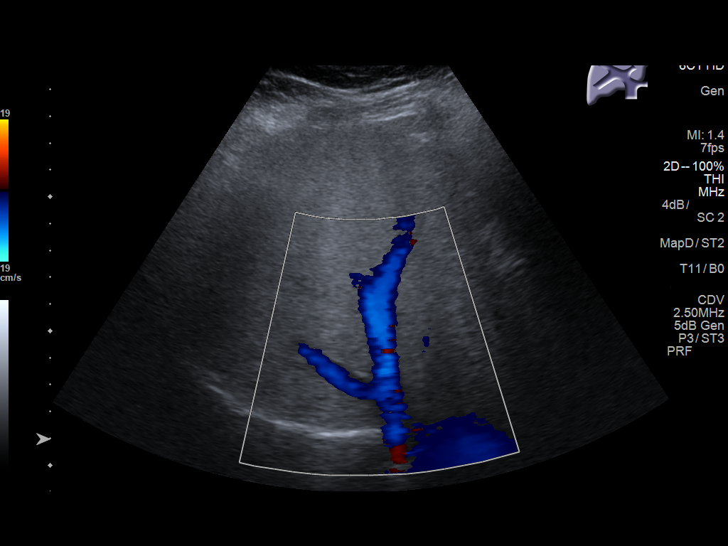
[im 29/35]
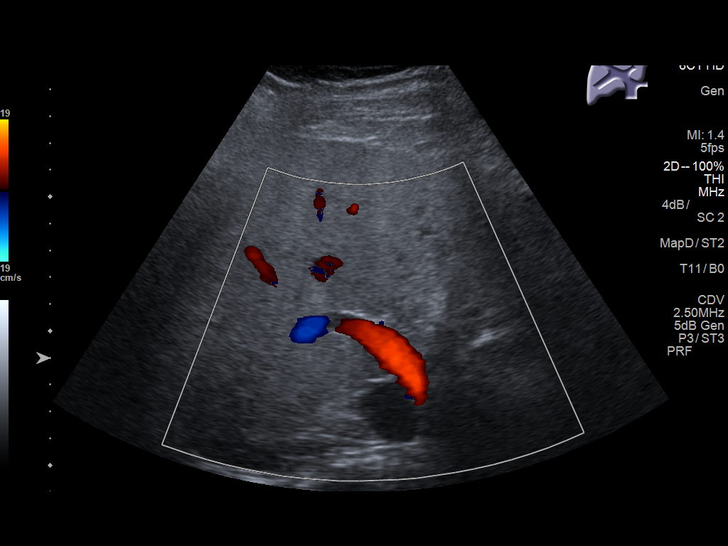
[im 32/35]
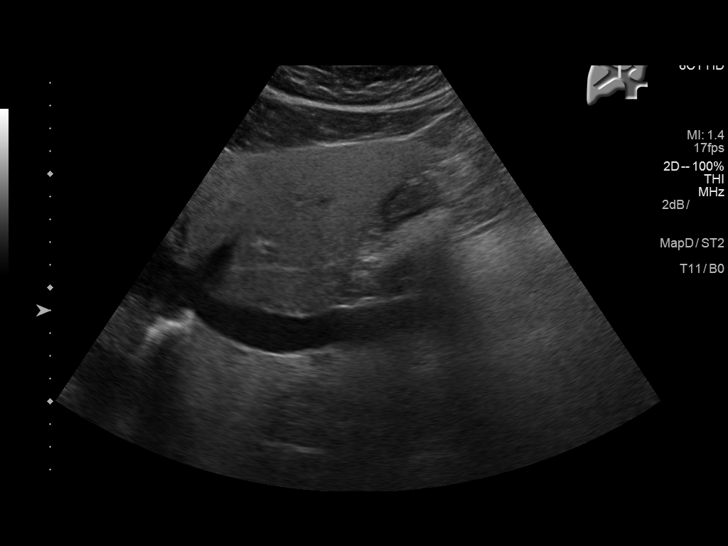
[im 35/35]
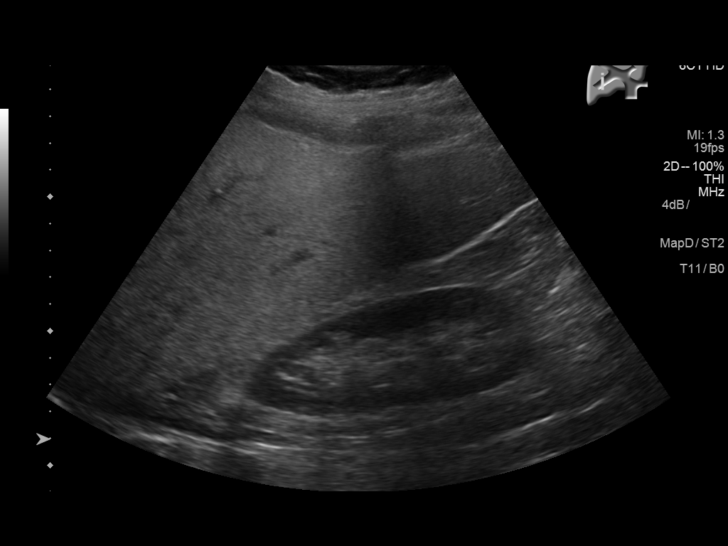

[14 of 25 positions shown; findings below may reference images not displayed]

FINDINGS: Gallbladder:

Surgically absent.

Common bile duct:

Diameter: 5 mm, within normal limits.

Liver:

Diffusely increased echogenicity of the hepatic parenchyma,
consistent with hepatic steatosis. No focal mass lesion identified.
IMPRESSION: Prior cholecystectomy.  No evidence of biliary ductal dilatation.

Diffuse hepatic steatosis.

## 2018-05-06 ENCOUNTER — Ambulatory Visit: Payer: BLUE CROSS/BLUE SHIELD | Admitting: Podiatry

## 2018-05-17 ENCOUNTER — Other Ambulatory Visit: Payer: Self-pay | Admitting: Family Medicine

## 2018-05-17 ENCOUNTER — Ambulatory Visit: Payer: BLUE CROSS/BLUE SHIELD | Admitting: Family Medicine

## 2018-05-20 ENCOUNTER — Other Ambulatory Visit: Payer: Self-pay

## 2018-05-20 ENCOUNTER — Ambulatory Visit: Payer: BLUE CROSS/BLUE SHIELD | Admitting: Podiatry

## 2018-05-20 ENCOUNTER — Ambulatory Visit: Payer: BLUE CROSS/BLUE SHIELD | Attending: Nurse Practitioner | Admitting: Nurse Practitioner

## 2018-05-20 ENCOUNTER — Encounter: Payer: Self-pay | Admitting: Nurse Practitioner

## 2018-05-20 VITALS — BP 124/80 | HR 60 | Temp 98.1°F | Resp 18 | Ht 64.0 in | Wt 157.0 lb

## 2018-05-20 DIAGNOSIS — M7551 Bursitis of right shoulder: Secondary | ICD-10-CM | POA: Diagnosis not present

## 2018-05-20 DIAGNOSIS — F329 Major depressive disorder, single episode, unspecified: Secondary | ICD-10-CM | POA: Insufficient documentation

## 2018-05-20 DIAGNOSIS — M47816 Spondylosis without myelopathy or radiculopathy, lumbar region: Secondary | ICD-10-CM | POA: Diagnosis not present

## 2018-05-20 DIAGNOSIS — M160 Bilateral primary osteoarthritis of hip: Secondary | ICD-10-CM | POA: Diagnosis not present

## 2018-05-20 DIAGNOSIS — M79604 Pain in right leg: Secondary | ICD-10-CM | POA: Insufficient documentation

## 2018-05-20 DIAGNOSIS — M7061 Trochanteric bursitis, right hip: Secondary | ICD-10-CM | POA: Diagnosis not present

## 2018-05-20 DIAGNOSIS — R109 Unspecified abdominal pain: Secondary | ICD-10-CM | POA: Insufficient documentation

## 2018-05-20 DIAGNOSIS — Z79891 Long term (current) use of opiate analgesic: Secondary | ICD-10-CM | POA: Diagnosis not present

## 2018-05-20 DIAGNOSIS — E785 Hyperlipidemia, unspecified: Secondary | ICD-10-CM | POA: Insufficient documentation

## 2018-05-20 DIAGNOSIS — Z87891 Personal history of nicotine dependence: Secondary | ICD-10-CM | POA: Insufficient documentation

## 2018-05-20 DIAGNOSIS — M797 Fibromyalgia: Secondary | ICD-10-CM | POA: Diagnosis not present

## 2018-05-20 DIAGNOSIS — M25561 Pain in right knee: Secondary | ICD-10-CM | POA: Diagnosis not present

## 2018-05-20 DIAGNOSIS — Z9049 Acquired absence of other specified parts of digestive tract: Secondary | ICD-10-CM | POA: Insufficient documentation

## 2018-05-20 DIAGNOSIS — R200 Anesthesia of skin: Secondary | ICD-10-CM | POA: Diagnosis not present

## 2018-05-20 DIAGNOSIS — Z5181 Encounter for therapeutic drug level monitoring: Secondary | ICD-10-CM | POA: Diagnosis not present

## 2018-05-20 DIAGNOSIS — M542 Cervicalgia: Secondary | ICD-10-CM | POA: Diagnosis not present

## 2018-05-20 DIAGNOSIS — M7062 Trochanteric bursitis, left hip: Secondary | ICD-10-CM | POA: Insufficient documentation

## 2018-05-20 DIAGNOSIS — M79605 Pain in left leg: Secondary | ICD-10-CM | POA: Diagnosis not present

## 2018-05-20 DIAGNOSIS — M545 Low back pain: Secondary | ICD-10-CM | POA: Diagnosis not present

## 2018-05-20 DIAGNOSIS — R102 Pelvic and perineal pain: Secondary | ICD-10-CM | POA: Insufficient documentation

## 2018-05-20 DIAGNOSIS — M79602 Pain in left arm: Secondary | ICD-10-CM | POA: Diagnosis not present

## 2018-05-20 DIAGNOSIS — G894 Chronic pain syndrome: Secondary | ICD-10-CM | POA: Diagnosis not present

## 2018-05-20 DIAGNOSIS — M47896 Other spondylosis, lumbar region: Secondary | ICD-10-CM | POA: Diagnosis not present

## 2018-05-20 DIAGNOSIS — M25511 Pain in right shoulder: Secondary | ICD-10-CM | POA: Insufficient documentation

## 2018-05-20 DIAGNOSIS — M47892 Other spondylosis, cervical region: Secondary | ICD-10-CM | POA: Diagnosis not present

## 2018-05-20 DIAGNOSIS — M546 Pain in thoracic spine: Secondary | ICD-10-CM | POA: Diagnosis not present

## 2018-05-20 DIAGNOSIS — F419 Anxiety disorder, unspecified: Secondary | ICD-10-CM | POA: Insufficient documentation

## 2018-05-20 DIAGNOSIS — M25512 Pain in left shoulder: Secondary | ICD-10-CM | POA: Insufficient documentation

## 2018-05-20 DIAGNOSIS — M47812 Spondylosis without myelopathy or radiculopathy, cervical region: Secondary | ICD-10-CM

## 2018-05-20 DIAGNOSIS — M4722 Other spondylosis with radiculopathy, cervical region: Secondary | ICD-10-CM | POA: Diagnosis not present

## 2018-05-20 DIAGNOSIS — Z79899 Other long term (current) drug therapy: Secondary | ICD-10-CM | POA: Insufficient documentation

## 2018-05-20 MED ORDER — OXYCODONE HCL 5 MG PO TABS
5.0000 mg | ORAL_TABLET | Freq: Four times a day (QID) | ORAL | 0 refills | Status: DC | PRN
Start: 2018-08-06 — End: 2018-08-19

## 2018-05-20 MED ORDER — NALOXONE HCL 4 MG/0.1ML NA LIQD: NASAL | 0 refills | Status: DC

## 2018-05-20 MED ORDER — OXYCODONE HCL 5 MG PO TABS: 5.0000 mg | ORAL_TABLET | Freq: Four times a day (QID) | ORAL | 0 refills | Status: DC | PRN

## 2018-05-20 MED ORDER — IBUPROFEN 800 MG PO TABS: 800.0000 mg | ORAL_TABLET | Freq: Three times a day (TID) | ORAL | 0 refills | Status: DC | PRN

## 2018-05-20 NOTE — Patient Instructions (Signed)
____________________________________________________________________________________________  Medication Rules  Applies to: All patients receiving prescriptions (written or electronic).  Pharmacy of record: Pharmacy where electronic prescriptions will be sent. If written prescriptions are taken to a different pharmacy, please inform the nursing staff. The pharmacy listed in the electronic medical record should be the one where you would like electronic prescriptions to be sent.  Prescription refills: Only during scheduled appointments. Applies to both, written and electronic prescriptions.  NOTE: The following applies primarily to controlled substances (Opioid* Pain Medications).   Patient's responsibilities: 1. Pain Pills: Bring all pain pills to every appointment (except for procedure appointments). 2. Pill Bottles: Bring pills in original pharmacy bottle. Always bring newest bottle. Bring bottle, even if empty. 3. Medication refills: You are responsible for knowing and keeping track of what medications you need refilled. The day before your appointment, write a list of all prescriptions that need to be refilled. Bring that list to your appointment and give it to the admitting nurse. Prescriptions will be written only during appointments. If you forget a medication, it will not be "Called in", "Faxed", or "electronically sent". You will need to get another appointment to get these prescribed. 4. Prescription Accuracy: You are responsible for carefully inspecting your prescriptions before leaving our office. Have the discharge nurse carefully go over each prescription with you, before taking them home. Make sure that your name is accurately spelled, that your address is correct. Check the name and dose of your medication to make sure it is accurate. Check the number of pills, and the written instructions to make sure they are clear and accurate. Make sure that you are given enough medication to last  until your next medication refill appointment. 5. Taking Medication: Take medication as prescribed. Never take more pills than instructed. Never take medication more frequently than prescribed. Taking less pills or less frequently is permitted and encouraged, when it comes to controlled substances (written prescriptions).  6. Inform other Doctors: Always inform, all of your healthcare providers, of all the medications you take. 7. Pain Medication from other Providers: You are not allowed to accept any additional pain medication from any other Doctor or Healthcare provider. There are two exceptions to this rule. (see below) In the event that you require additional pain medication, you are responsible for notifying us, as stated below. 8. Medication Agreement: You are responsible for carefully reading and following our Medication Agreement. This must be signed before receiving any prescriptions from our practice. Safely store a copy of your signed Agreement. Violations to the Agreement will result in no further prescriptions. (Additional copies of our Medication Agreement are available upon request.) 9. Laws, Rules, & Regulations: All patients are expected to follow all Federal and State Laws, Statutes, Rules, & Regulations. Ignorance of the Laws does not constitute a valid excuse. The use of any illegal substances is prohibited. 10. Adopted CDC guidelines & recommendations: Target dosing levels will be at or below 60 MME/day. Use of benzodiazepines** is not recommended.  Exceptions: There are only two exceptions to the rule of not receiving pain medications from other Healthcare Providers. 1. Exception #1 (Emergencies): In the event of an emergency (i.e.: accident requiring emergency care), you are allowed to receive additional pain medication. However, you are responsible for: As soon as you are able, call our office [phone], at any time of the day or night, and leave a message stating your name, the  date and nature of the emergency, and the name and dose of the medication   prescribed. In the event that your call is answered by a member of our staff, make sure to document and save the date, time, and the name of the person that took your information.  2. Exception #2 (Planned Surgery): In the event that you are scheduled by another doctor or dentist to have any type of surgery or procedure, you are allowed (for a period no longer than 30 days), to receive additional pain medication, for the acute post-op pain. However, in this case, you are responsible for picking up a copy of our "Post-op Pain Management for Surgeons" handout, and giving it to your surgeon or dentist. This document is available at our office, and does not require an appointment to obtain it. Simply go to our office during business hours (Monday-Thursday from 8:00 AM to 4:00 PM) (Friday 8:00 AM to 12:00 Noon) or if you have a scheduled appointment with us, prior to your surgery, and ask for it by name. In addition, you will need to provide us with your name, name of your surgeon, type of surgery, and date of procedure or surgery.  *Opioid medications include: morphine, codeine, oxycodone, oxymorphone, hydrocodone, hydromorphone, meperidine, tramadol, tapentadol, buprenorphine, fentanyl, methadone. **Benzodiazepine medications include: diazepam (Valium), alprazolam (Xanax), clonazepam (Klonopine), lorazepam (Ativan), clorazepate (Tranxene), chlordiazepoxide (Librium), estazolam (Prosom), oxazepam (Serax), temazepam (Restoril), triazolam (Halcion) (Last updated: 01/03/2018) ____________________________________________________________________________________________    

## 2018-05-20 NOTE — Progress Notes (Signed)
Nursing Pain Medication Assessment:  Safety precautions to be maintained throughout the outpatient stay will include: orient to surroundings, keep bed in low position, maintain call bell within reach at all times, provide assistance with transfer out of bed and ambulation.  Medication Inspection Compliance: Pill count conducted under aseptic conditions, in front of the patient. Neither the pills nor the bottle was removed from the patient's sight at any time. Once count was completed pills were immediately returned to the patient in their original bottle.  Medication: Oxycodone IR Pill/Patch Count: 63 of 120 pills remain Pill/Patch Appearance: Markings consistent with prescribed medication Bottle Appearance: Standard pharmacy container. Clearly labeled. Filled Date: 07 / 03 / 2019 Last Medication intake:  Today

## 2018-05-20 NOTE — Telephone Encounter (Signed)
Copied from [place] [phone]. Topic: Quick Communication - Rx Refill/Question >> May 20, 2018 11:59 AM Bea Graff, NT wrote: Medication: LORazepam (ATIVAN) 1 MG tablet   Has the patient contacted their pharmacy? Yes.   (Agent: If no, request that the patient contact the pharmacy for the refill.) (Agent: If yes, when and what did the pharmacy advise?)  Preferred Pharmacy (with phone number or street name): [facility], [place] [phone] (Phone) [phone] (Fax)    Leaving town in the morning.  Agent: Please be advised that RX refills may take up to 3 business days. We ask that you follow-up with your pharmacy.

## 2018-05-20 NOTE — Progress Notes (Signed)
Patient's Name: Nicole Walter  MRN: [id number]  Referring Provider: Leone Haven, MD  DOB: 01-18-1961  PCP: Leone Haven, MD  DOS: 05/20/2018  Note by: Vevelyn Francois NP  Service setting: Ambulatory outpatient  Specialty: Interventional Pain Management  Location: [facility] (AMB) Pain Management Facility    Patient type: Established    Primary Reason(s) for Visit: Encounter for prescription drug management. (Level of risk: moderate)  CC: Fibromyalgia ("pain is pretty much everywhere") and Neck Pain (hand go numb at times)  HPI  Nicole Walter is a 57 y.o. year old, female patient, who comes today for a medication management evaluation. She has Anxiety and depression; Chronic pain syndrome; Lumbar spondylosis; Combined fat and carbohydrate induced hyperlipemia; Encounter for general adult medical examination with abnormal findings; Thoracic back pain; Chronic neck pain (Primary Source of Pain) (Bilateral) (L>R); Concentration deficit; Hyperlipidemia; Cervical spondylosis (Bilateral); Fibromyalgia; Osteopenia; Skin lesions; Chronic hip pain (Bilateral) (L>R); Bursitis of right shoulder; Chronic Greater trochanteric bursitis (Bilateral) (L>R); Long term current use of opiate analgesic; Long term prescription opiate use; Opiate use; Chronic upper back pain (Secondary source of pain) (Bilateral) (L>R); Chronic shoulder pain [facility] source of pain) (Bilateral) (L>R); Chronic knee pain (Right); Chronic upper extremity pain (Left); Chronic lower extremity pain (Bilateral) (L>R); Chronic abdominal pain; Elevated sedimentation rate; Elevated C-reactive protein (CRP); Overweight (BMI 25.0-29.9); Greater trochanteric bursitis (Right); Subacromial bursitis of shoulder joint (Right); Osteoarthritis of hip (Bilateral) (L>R); Thoracic radiculitis (R>L); Thoracic spondylosis; Shortness of breath; Osteoarthritis of shoulder (Right); Osteoarthritis of knee (Right); Chronic knee arthropathy (Right); Chronic right hip  pain; Muscle spasm; Myofascial pain syndrome, cervical (trapezius) (Left); Cervical facet syndrome (Bilateral) (L>R); Constipation; Chronic low back pain (Bilateral) (L>R); Cervical Foraminal Stenosis (Severe) (Bilateral: C-5-6); Cervical spondylosis with radiculopathy (Bilateral) (L>R); Cervical sensory radiculopathy at C5 (Bilateral); Radicular pain of shoulder (Bilateral) (C5); Chronic pelvic pain in female; Fatty liver; Spondylosis without myelopathy or radiculopathy, cervical region; Laceration of hand; Cervicalgia; and Acute post-operative pain on their problem list. Her primarily concern today is the Fibromyalgia ("pain is pretty much everywhere") and Neck Pain (hand go numb at times)  Pain Assessment: Location:   Neck(everywhere, joint and muscle pain, having in numbness in both hands) Radiating: hands are having numbing periods. Onset: More than a month ago Duration: Chronic pain Quality: Aching(pulling sensation.) Severity: 1 /10 (subjective, self-reported pain score)  Note: Reported level is compatible with observation.                          Effect on ADL:   Timing: Constant Modifying factors: medications, rest, heat , ce BP: 124/80  HR: 60  Nicole Walter was last scheduled for an appointment on 02/18/2018 for medication management. During today's appointment we reviewed Nicole Walter's chronic pain status, as well as her outpatient medication regimen. She admits that her neck pain continues with the numbness and tingling in her arms. She is SP Left Cervical facet RFA.  She is also having tenderness in her left scalp. She is having a popping in her bra line with turning and tenderness to her thoracic area.  She uses Linzess with the constipation.  The patient  reports that she does not use drugs. Her body mass index is 26.95 kg/m.  Further details on both, my assessment(s), as well as the proposed treatment plan, please see below.  Controlled Substance Pharmacotherapy Assessment REMS  (Risk Evaluation and Mitigation Strategy)  Analgesic:Oxycodone one tablet by mouth 4times a day (63m/dayof  hydrocodone) MME/day:92m/day    SHart Rochester RN  05/20/2018  9:17 AM  Sign at close encounter Nursing Pain Medication Assessment:  Safety precautions to be maintained throughout the outpatient stay will include: orient to surroundings, keep bed in low position, maintain call bell within reach at all times, provide assistance with transfer out of bed and ambulation.  Medication Inspection Compliance: Pill count conducted under aseptic conditions, in front of the patient. Neither the pills nor the bottle was removed from the patient's sight at any time. Once count was completed pills were immediately returned to the patient in their original bottle.  Medication: Oxycodone IR Pill/Patch Count: 63 of 120 pills remain Pill/Patch Appearance: Markings consistent with prescribed medication Bottle Appearance: Standard pharmacy container. Clearly labeled. Filled Date: 07 / 03 / 2019 Last Medication intake:  Today   Pharmacokinetics: Liberation and absorption (onset of action): WNL Distribution (time to peak effect): WNL Metabolism and excretion (duration of action): WNL         Pharmacodynamics: Desired effects: Analgesia: Ms. GBramanreports >50% benefit. Functional ability: Patient reports that medication allows her to accomplish basic ADLs Clinically meaningful improvement in function (CMIF): Sustained CMIF goals met Perceived effectiveness: Described as relatively effective, allowing for increase in activities of daily living (ADL) Undesirable effects: Side-effects or Adverse reactions: None reported Monitoring: [place] PMP: Online review of the past 141-montheriod conducted. Compliant with practice rules and regulations Last UDS on record: Summary  Date Value Ref Range Status  12/03/2017 FINAL  Final    Comment:     ==================================================================== TOXASSURE SELECT 13 (MW) ==================================================================== Test                             Result       Flag       Units Drug Present   Oxycodone                      1486                    ng/mg creat   Oxymorphone                    468                     ng/mg creat   Noroxycodone                   2089                    ng/mg creat   Noroxymorphone                 252                     ng/mg creat    Sources of oxycodone are scheduled prescription medications.    Oxymorphone, noroxycodone, and noroxymorphone are expected    metabolites of oxycodone. Oxymorphone is also available as a    scheduled prescription medication. ==================================================================== Test                      Result    Flag   Units      Ref Range   Creatinine              157  mg/dL      >=20 ==================================================================== Declared Medications:  Medication list was not provided. ==================================================================== For clinical consultation, please call [phone]. ====================================================================    UDS interpretation: Compliant          Medication Assessment Form: Reviewed. Patient indicates being compliant with therapy Treatment compliance: Compliant Risk Assessment Profile: Aberrant behavior: See prior evaluations. None observed or detected today Comorbid factors increasing risk of overdose: See prior notes. No additional risks detected today Risk of substance use disorder (SUD): Low Opioid Risk Tool - 04/08/18 0805      Family History of Substance Abuse   Alcohol  Negative    Illegal Drugs  Positive Female    Rx Drugs  Positive Female or Female      Personal History of Substance Abuse   Alcohol  Negative    Illegal Drugs  Negative    Rx  Drugs  Negative      Age   Age between 73-45 years   No      History of Preadolescent Sexual Abuse   History of Preadolescent Sexual Abuse  Negative or Female      Psychological Disease   Psychological Disease  Negative    Depression  Positive      Total Score   Opioid Risk Tool Scoring  7    Opioid Risk Interpretation  Moderate Risk      ORT Scoring interpretation table:  Score <3 = Low Risk for SUD  Score between 4-7 = Moderate Risk for SUD  Score >8 = High Risk for Opioid Abuse   Risk Mitigation Strategies:  Patient Counseling: Covered Patient-Prescriber Agreement (PPA): Present and active  Notification to other healthcare providers: Done  Pharmacologic Plan: No change in therapy, at this time.             Laboratory Chemistry  Inflammation Markers (CRP: Acute Phase) (ESR: Chronic Phase) Lab Results  Component Value Date   CRP 2.1 (H) 07/20/2016   ESRSEDRATE 2 05/15/2017                         Rheumatology Markers Lab Results  Component Value Date   RF <10.0 08/31/2016                        Renal Function Markers Lab Results  Component Value Date   BUN 12 10/24/2017   CREATININE 0.80 10/24/2017   GFRAA >60 07/20/2016   GFRNONAA >60 07/20/2016                             Hepatic Function Markers Lab Results  Component Value Date   AST 14 10/24/2017   ALT 11 10/24/2017   ALBUMIN 4.2 10/24/2017   ALKPHOS 65 10/24/2017   HCVAB NEGATIVE 02/14/2017   LIPASE 13 01/15/2017                        Electrolytes Lab Results  Component Value Date   NA 142 10/24/2017   K 4.2 10/24/2017   CL 106 10/24/2017   CALCIUM 9.1 10/24/2017   MG 2.2 05/15/2017                        Neuropathy Markers Lab Results  Component Value Date   VITAMINB12 987 (H) 07/20/2016   HGBA1C 5.3 10/24/2017  Bone Pathology Markers Lab Results  Component Value Date   25OHVITD1 58 07/20/2016   25OHVITD2 <1.0 07/20/2016   25OHVITD3 58 07/20/2016                          Coagulation Parameters Lab Results  Component Value Date   PLT 262.0 10/24/2017                        Cardiovascular Markers Lab Results  Component Value Date   TROPONINI <0.03 07/05/2016   HGB 13.2 10/24/2017   HCT 40.2 10/24/2017                         CA Markers No results found for: CEA, CA125, LABCA2                      Note: Lab results reviewed.  Recent Diagnostic Imaging Results  DG Thoracic Spine 2 View CLINICAL DATA:  Chronic thoracic back pain.  EXAM: THORACIC SPINE 2 VIEWS  COMPARISON:  CT chest dated November 28, 2017.  FINDINGS: Twelve rib-bearing thoracic vertebral bodies. Slight levocurvature of the upper thoracic spine. No acute fracture or subluxation. Vertebral body heights are preserved. Alignment is normal. Scattered mild anterior endplate spurring throughout the thoracic spine. Intervertebral disc spaces are maintained.  IMPRESSION: 1.  No acute osseous abnormality.  Mild degenerative changes.  Electronically Signed   By: Titus Dubin M.D.   On: 03/29/2018 17:50  Complexity Note: Imaging results reviewed. Results shared with Nicole Walter, using Layman's terms.                         Meds   Current Outpatient Medications:  .  Calcium Carbonate-Vit D-Min (CALCIUM 1200 PO), Take by mouth., Disp: , Rfl:  .  fluocinonide ointment (LIDEX) 0.05 %, APPLY TOPICALLY TWICE A DAY TO ITCHY AREAS ONLY. as needed, Disp: , Rfl: 0 .  fluticasone (FLONASE) 50 MCG/ACT nasal spray, instill 2 sprays into each nostril once daily, Disp: 16 g, Rfl: 11 .  ibuprofen (ADVIL,MOTRIN) 800 MG tablet, Take 1 tablet (800 mg total) by mouth every 8 (eight) hours as needed., Disp: 30 tablet, Rfl: 0 .  linaclotide (LINZESS) 145 MCG CAPS capsule, Take 145 mcg by mouth daily before breakfast., Disp: , Rfl:  .  LORazepam (ATIVAN) 1 MG tablet, take 1 tablet by mouth twice a day if needed for anxiety .Marland KitchenMarland KitchenDO NOT DRINK ALCOHOL WHILE TAKING THIS MEDICATION, Disp:  30 tablet, Rfl: 1 .  Multiple Vitamin (MULTI-VITAMINS) TABS, Take 1 tablet by mouth daily. , Disp: , Rfl:  .  Omega-3 Fatty Acids (FISH OIL) 1200 MG CAPS, Take by mouth., Disp: , Rfl:  .  [START ON 08/06/2018] oxyCODONE (OXY IR/ROXICODONE) 5 MG immediate release tablet, Take 1 tablet (5 mg total) by mouth every 6 (six) hours as needed for severe pain., Disp: 120 tablet, Rfl: 0 .  RABEprazole (ACIPHEX) 20 MG tablet, Take 20 mg by mouth as needed. , Disp: , Rfl:  .  sucralfate (CARAFATE) 1 g tablet, take 1 tablet by mouth three times a day if needed for abdominal pain, Disp: , Rfl: 0 .  vortioxetine HBr (TRINTELLIX) 5 MG TABS tablet, Take 3 tablets (15 mg total) by mouth daily., Disp: 90 tablet, Rfl: 3 .  gabapentin (NEURONTIN) 300 MG capsule, Take 1-3 capsules (300-900 mg total) by mouth  4 (four) times daily. Follow the written titration instructions. (Patient taking differently: Take 300 mg by mouth 4 (four) times daily. Follow the written titration instructions.), Disp: 360 capsule, Rfl: 0 .  naloxone (NARCAN) nasal spray 4 mg/0.1 mL, Spray into one nostril. Repeat with second device into other nostril after 2-3 minutes if no or minimal response. Use in case of opioid overdose., Disp: 1 kit, Rfl: 0 .  [START ON 07/07/2018] oxyCODONE (OXY IR/ROXICODONE) 5 MG immediate release tablet, Take 1 tablet (5 mg total) by mouth every 6 (six) hours as needed for severe pain., Disp: 120 tablet, Rfl: 0 .  [START ON 06/07/2018] oxyCODONE (OXY IR/ROXICODONE) 5 MG immediate release tablet, Take 1 tablet (5 mg total) by mouth every 6 (six) hours as needed for severe pain., Disp: 120 tablet, Rfl: 0  ROS  Constitutional: Denies any fever or chills Gastrointestinal: No reported hemesis, hematochezia, vomiting, or acute GI distress Musculoskeletal: Denies any acute onset joint swelling, redness, loss of ROM, or weakness Neurological: No reported episodes of acute onset apraxia, aphasia, dysarthria, agnosia, amnesia,  paralysis, loss of coordination, or loss of consciousness  Allergies  Nicole Walter is allergic to other and food.  PFSH  Drug: Nicole Walter  reports that she does not use drugs. Alcohol:  reports that she does not drink alcohol. Tobacco:  reports that she quit smoking about 2 years ago. She started smoking about 44 years ago. She has a 35.00 pack-year smoking history. She has never used smokeless tobacco. Medical:  has a past medical history of Abnormal liver enzymes (02/08/2016), Allergy, Anxiety, Chest pain (07/05/2016), Chronic pain, Depression, DJD (degenerative joint disease), Fibromyalgia, High cholesterol, Rash of back (01/28/2016), and Weight loss due to medication. Surgical: Nicole Walter  has a past surgical history that includes Cholecystectomy; Tubal ligation; Ankle surgery; Nasal sinus surgery; Tonsillectomy; Colonoscopy; Esophagogastroduodenoscopy (egd) with propofol (N/A, 02/22/2016); Colonoscopy with propofol (N/A, 02/22/2016); and Breast biopsy (Left, 1997). Family: family history includes Arthritis in her father, paternal grandfather, and paternal grandmother; Cancer in her brother and father; Dementia in her sister; Diabetes in her sister; Drug abuse in her brother, brother, and sister; Heart disease in her mother; Post-traumatic stress disorder in her brother; Stroke in her mother.  Constitutional Exam  General appearance: Well nourished, well developed, and well hydrated. In no apparent acute distress Vitals:   05/20/18 0919  BP: 124/80  Pulse: 60  Resp: 18  Temp: 98.1 F (36.7 C)  TempSrc: Oral  SpO2: 99%  Weight: 157 lb (71.2 kg)  Height: _0  (1.626 m)   BMI Assessment: Estimated body mass index is 26.95 kg/m as calculated from the following:   Height as of this encounter: _1  (1.626 m).   Weight as of this encounter: 157 lb (71.2 kg). Psych/Mental status: Alert, oriented x 3 (person, place, & time)       Eyes: PERLA Respiratory: No evidence of acute respiratory  distress  Cervical Spine Area Exam  Skin & Axial Inspection: No masses, redness, edema, swelling, or associated skin lesions Alignment: Symmetrical Functional ROM: Adequate ROM      Stability: No instability detected Muscle Tone/Strength: Functionally intact. No obvious neuro-muscular anomalies detected. Sensory (Neurological): Unimpaired Palpation: No palpable anomalies              Upper Extremity (UE) Exam    Side: Right upper extremity  Side: Left upper extremity  Skin & Extremity Inspection: Skin color, temperature, and hair growth are WNL. No peripheral edema or cyanosis. No  masses, redness, swelling, asymmetry, or associated skin lesions. No contractures.  Skin & Extremity Inspection: Skin color, temperature, and hair growth are WNL. No peripheral edema or cyanosis. No masses, redness, swelling, asymmetry, or associated skin lesions. No contractures.  Functional ROM: Unrestricted ROM          Functional ROM: Unrestricted ROM          Muscle Tone/Strength: Functionally intact. No obvious neuro-muscular anomalies detected.  Muscle Tone/Strength: Functionally intact. No obvious neuro-muscular anomalies detected.  Sensory (Neurological): Unimpaired          Sensory (Neurological): Unimpaired          Palpation: No palpable anomalies              Palpation: No palpable anomalies              Provocative Test(s):  Phalen's test: deferred Tinel's test: deferred Apley's scratch test (touch opposite shoulder):  Action 1 (Across chest): deferred Action 2 (Overhead): deferred Action 3 (LB reach): deferred   Provocative Test(s):  Phalen's test: deferred Tinel's test: deferred Apley's scratch test (touch opposite shoulder):  Action 1 (Across chest): deferred Action 2 (Overhead): deferred Action 3 (LB reach): deferred    Thoracic Spine Area Exam  Skin & Axial Inspection: No masses, redness, or swelling Alignment: Symmetrical Functional ROM: Adequate ROM Stability: No instability  detected Muscle Tone/Strength: Functionally intact. No obvious neuro-muscular anomalies detected. Sensory (Neurological): Unimpaired Muscle strength & Tone: No palpable anomalies   Gait & Posture Assessment  Ambulation: Unassisted Gait: Relatively normal for age and body habitus Posture: WNL    Assessment  Primary Diagnosis & Pertinent Problem List: The primary encounter diagnosis was Long term prescription opiate use. A diagnosis of Chronic pain syndrome was also pertinent to this visit.  Status Diagnosis  Controlled Controlled Controlled 1. Long term prescription opiate use   2. Chronic pain syndrome     Problems updated and reviewed during this visit: No problems updated. Plan of Care  Pharmacotherapy (Medications Ordered): Meds ordered this encounter  Medications  . ibuprofen (ADVIL,MOTRIN) 800 MG tablet    Sig: Take 1 tablet (800 mg total) by mouth every 8 (eight) hours as needed.    Dispense:  30 tablet    Refill:  0    Order Specific Question:   Supervising Provider    Answer:   Milinda Pointer [phone]  . oxyCODONE (OXY IR/ROXICODONE) 5 MG immediate release tablet    Sig: Take 1 tablet (5 mg total) by mouth every 6 (six) hours as needed for severe pain.    Dispense:  120 tablet    Refill:  0    Do not place this medication, or any other prescription from our practice, on "Automatic Refill". Patient may have prescription filled one day early if pharmacy is closed on scheduled refill date. Do not fill until:08/06/2018 To last until:09/05/2018    Order Specific Question:   Supervising Provider    Answer:   Milinda Pointer [phone]  . oxyCODONE (OXY IR/ROXICODONE) 5 MG immediate release tablet    Sig: Take 1 tablet (5 mg total) by mouth every 6 (six) hours as needed for severe pain.    Dispense:  120 tablet    Refill:  0    Do not place this medication, or any other prescription from our practice, on "Automatic Refill". Patient may have prescription filled one  day early if pharmacy is closed on scheduled refill date. Do not fill until:07/07/2018 To last until:08/06/2018  Order Specific Question:   Supervising Provider    Answer:   Milinda Pointer [phone]  . oxyCODONE (OXY IR/ROXICODONE) 5 MG immediate release tablet    Sig: Take 1 tablet (5 mg total) by mouth every 6 (six) hours as needed for severe pain.    Dispense:  120 tablet    Refill:  0    Do not place this medication, or any other prescription from our practice, on "Automatic Refill". Patient may have prescription filled one day early if pharmacy is closed on scheduled refill date. Do not fill until:06/07/2018 To last until:07/07/2018    Order Specific Question:   Supervising Provider    Answer:   Milinda Pointer [phone]  . naloxone (NARCAN) nasal spray 4 mg/0.1 mL    Sig: Spray into one nostril. Repeat with second device into other nostril after 2-3 minutes if no or minimal response. Use in case of opioid overdose.    Dispense:  1 kit    Refill:  0    Narcan Nasal Spray. (2 pack) Please provide the patient with clear instructions on the use of this device/medication.    Order Specific Question:   Supervising Provider    Answer:   Milinda Pointer [phone]   New Prescriptions   NALOXONE (NARCAN) NASAL SPRAY 4 MG/0.1 ML    Spray into one nostril. Repeat with second device into other nostril after 2-3 minutes if no or minimal response. Use in case of opioid overdose.   Medications administered today: Nicole Walter had no medications administered during this visit. Lab-work, procedure(s), and/or referral(s): Orders Placed This Encounter  Procedures  . ToxASSURE Select 13 (MW), Urine   Imaging and/or referral(s): None  Interventional therapies: Planned, scheduled, and/or pending: Right cervical facet radiofrequencyablation.   Considering: Diagnostic bilateral cervical facet block Possible bilateral cervical facet radiofrequencyablation Diagnostic left-sided  cervical epidural steroid injection Diagnostic bilateral intra-articular shoulder joint injection Diagnostic bilateral suprascapular nerve block Possible bilateral suprascapular nerve radiofrequencyablation Diagnostic bilateral lumbar facet block Possible bilateral lumbar facet radiofrequencyablation  Diagnostic bilateral intra-articular hip joint injection Diagnostic bilateral femoral and obturator tickler branch blocks Possible bilateral hip joint radiofrequencyablation  Diagnosticright intra-articular knee joint injection Possible series of 5, right-sided, intra-articular knee joint injections with Hyalgan.  Possible right sided genicular nerve block Possible right sided genicular nerve radiofrequencyablation Possible left-sided lumbar epidural steroid injection   Palliative PRN treatment(s): Palliativebilateral suprascapular nerve block Palliativeright intra-articular knee joint injection    Provider-requested follow-up: Return in about 3 months (around 08/20/2018) for MedMgmt with Me Donella Stade Edison Pace).  Future Appointments  Date Time Provider [place]  05/28/2018 10:30 AM Milinda Pointer, MD [facility] None  06/04/2018  9:00 AM Leone Haven, MD [facility]  06/10/2018  3:45 PM Gardiner Barefoot, DPM [facility]  08/19/2018  9:15 AM Vevelyn Francois, NP [facility] None  10/22/2018 10:00 AM Harlin Heys, MD [facility] None   Primary Care Physician: Leone Haven, MD Location: [facility] Out[facility] Management Facility Note by: Vevelyn Francois NP Date: 05/20/2018; Time: 4:41 PM  Pain Score Disclaimer: We use the NRS-11 scale. This is a self-reported, subjective measurement of pain severity with only modest accuracy. It is used primarily to identify changes within a particular patient. It must be understood that outpatient pain scales are significantly less accurate that those used for research, where they can be applied under ideal controlled  circumstances with minimal exposure to variables. In reality, the score is likely to be a combination of pain intensity and pain  affect, where pain affect describes the degree of emotional arousal or changes in action readiness caused by the sensory experience of pain. Factors such as social and work situation, setting, emotional state, anxiety levels, expectation, and prior pain experience may influence pain perception and show large inter-individual differences that may also be affected by time variables.  Patient instructions provided during this appointment: Patient Instructions  ____________________________________________________________________________________________  Medication Rules  Applies to: All patients receiving prescriptions (written or electronic).  Pharmacy of record: Pharmacy where electronic prescriptions will be sent. If written prescriptions are taken to a different pharmacy, please inform the nursing staff. The pharmacy listed in the electronic medical record should be the one where you would like electronic prescriptions to be sent.  Prescription refills: Only during scheduled appointments. Applies to both, written and electronic prescriptions.  NOTE: The following applies primarily to controlled substances (Opioid* Pain Medications).   Patient's responsibilities: 1. Pain Pills: Bring all pain pills to every appointment (except for procedure appointments). 2. Pill Bottles: Bring pills in original pharmacy bottle. Always bring newest bottle. Bring bottle, even if empty. 3. Medication refills: You are responsible for knowing and keeping track of what medications you need refilled. The day before your appointment, write a list of all prescriptions that need to be refilled. Bring that list to your appointment and give it to the admitting nurse. Prescriptions will be written only during appointments. If you forget a medication, it will not be "Called in", "Faxed", or  "electronically sent". You will need to get another appointment to get these prescribed. 4. Prescription Accuracy: You are responsible for carefully inspecting your prescriptions before leaving our office. Have the discharge nurse carefully go over each prescription with you, before taking them home. Make sure that your name is accurately spelled, that your address is correct. Check the name and dose of your medication to make sure it is accurate. Check the number of pills, and the written instructions to make sure they are clear and accurate. Make sure that you are given enough medication to last until your next medication refill appointment. 5. Taking Medication: Take medication as prescribed. Never take more pills than instructed. Never take medication more frequently than prescribed. Taking less pills or less frequently is permitted and encouraged, when it comes to controlled substances (written prescriptions).  6. Inform other Doctors: Always inform, all of your healthcare providers, of all the medications you take. 7. Pain Medication from other Providers: You are not allowed to accept any additional pain medication from any other Doctor or Healthcare provider. There are two exceptions to this rule. (see below) In the event that you require additional pain medication, you are responsible for notifying us, as stated below. 8. Medication Agreement: You are responsible for carefully reading and following our Medication Agreement. This must be signed before receiving any prescriptions from our practice. Safely store a copy of your signed Agreement. Violations to the Agreement will result in no further prescriptions. (Additional copies of our Medication Agreement are available upon request.) 9. Laws, Rules, & Regulations: All patients are expected to follow all Federal and [employer], [employer], Rules, [employer]. Ignorance of the Laws does not constitute a valid excuse. The use of any illegal substances is  prohibited. 10. Adopted CDC guidelines & recommendations: Target dosing levels will be at or below 60 MME/day. Use of benzodiazepines** is not recommended.  Exceptions: There are only two exceptions to the rule of not receiving pain medications from other Healthcare Providers. 1. Exception #1 (  Emergencies): In the event of an emergency (i.e.: accident requiring emergency care), you are allowed to receive additional pain medication. However, you are responsible for: As soon as you are able, call our office [phone], at any time of the day or night, and leave a message stating your name, the date and nature of the emergency, and the name and dose of the medication prescribed. In the event that your call is answered by a member of our staff, make sure to document and save the date, time, and the name of the person that took your information.  2. Exception #2 (Planned Surgery): In the event that you are scheduled by another doctor or dentist to have any type of surgery or procedure, you are allowed (for a period no longer than 30 days), to receive additional pain medication, for the acute post-op pain. However, in this case, you are responsible for picking up a copy of our "Post-op Pain Management for Surgeons" handout, and giving it to your surgeon or dentist. This document is available at our office, and does not require an appointment to obtain it. Simply go to our office during business hours (Monday-Thursday from 8:00 AM to 4:00 PM) (Friday 8:00 AM to 12:00 Noon) or if you have a scheduled appointment with [place], prior to your surgery, and ask for it by name. In addition, you will need to provide [place] with your name, name of your surgeon, type of surgery, and date of procedure or surgery.  *Opioid medications include: morphine, codeine, oxycodone, oxymorphone, hydrocodone, hydromorphone, meperidine, tramadol, tapentadol, buprenorphine, fentanyl, methadone. **Benzodiazepine medications include: diazepam  (Valium), alprazolam (Xanax), clonazepam (Klonopine), lorazepam (Ativan), clorazepate (Tranxene), chlordiazepoxide (Librium), estazolam (Prosom), oxazepam (Serax), temazepam (Restoril), triazolam (Halcion) (Last updated: 01/03/2018) ____________________________________________________________________________________________

## 2018-05-22 NOTE — Telephone Encounter (Signed)
Drug database reviewed.  Refill sent to pharmacy.

## 2018-05-22 NOTE — Telephone Encounter (Signed)
Last visit 02-12-18 Last filled 04-17-18 Next visit 06-04-18 Would you like to refill

## 2018-05-24 LAB — TOXASSURE SELECT 13 (MW), URINE

## 2018-05-28 ENCOUNTER — Ambulatory Visit: Payer: BLUE CROSS/BLUE SHIELD | Admitting: Pain Medicine

## 2018-06-04 ENCOUNTER — Telehealth: Payer: Self-pay

## 2018-06-04 ENCOUNTER — Telehealth: Payer: Self-pay | Admitting: Family Medicine

## 2018-06-04 ENCOUNTER — Ambulatory Visit: Payer: BLUE CROSS/BLUE SHIELD | Admitting: Family Medicine

## 2018-06-04 NOTE — Telephone Encounter (Signed)
Copied from [place] [phone]. Topic: Appointment Scheduling - Scheduling Inquiry for Clinic >> Jun 04, 2018  3:16 PM Synthia Innocent wrote: Reason for CRM: Patient was to be seen today, missed appt due to being late for appt. Needs to be seen asap. Able to work in?

## 2018-06-04 NOTE — Telephone Encounter (Signed)
See other phone note

## 2018-06-04 NOTE — Telephone Encounter (Signed)
Please advise where to schedule.

## 2018-06-04 NOTE — Telephone Encounter (Signed)
She could be scheduled on Monday or Wednesday at 430 wherever there is availability.

## 2018-06-04 NOTE — Telephone Encounter (Signed)
PT over slept for her 9am appt today. She needs to see Dr. Caryl Bis. She was rescheduled once before because Dr. Caryl Bis was out of office. Where can we schedule her?

## 2018-06-04 NOTE — Telephone Encounter (Signed)
Please advise 

## 2018-06-05 ENCOUNTER — Other Ambulatory Visit: Payer: Self-pay | Admitting: Family Medicine

## 2018-06-05 MED ORDER — LORAZEPAM 1 MG PO TABS: ORAL_TABLET | ORAL | 1 refills | Status: DC

## 2018-06-05 NOTE — Telephone Encounter (Signed)
Patient is scheduled, patient would like a refill on her lorazepam

## 2018-06-05 NOTE — Telephone Encounter (Signed)
This was refilled on 05/22/18 with one refill. She should be able to call the pharmacy to get this refilled.

## 2018-06-05 NOTE — Telephone Encounter (Signed)
Patient aware.

## 2018-06-05 NOTE — Telephone Encounter (Signed)
Controlled substance database reviewed previously.  Refill sent to pharmacy.

## 2018-06-05 NOTE — Telephone Encounter (Signed)
Last OV 02/12/18 last filled 12/27/17 90 [id number]

## 2018-06-05 NOTE — Telephone Encounter (Signed)
Patient knows she has a refill that the pharmacy is going to fill for her tomorrow but she is going out of town Monday evening and will be gone until 07/08/18. 30 tablets will not give her enough until she gets back. She is requesting another script before she leaves town.

## 2018-06-05 NOTE — Telephone Encounter (Signed)
Copied from [place] [phone]. Topic: Quick Communication - Rx Refill/Question >> Jun 05, 2018  1:27 PM Burchel, Abbi R wrote: Medication: LORazepam (ATIVAN) 1 MG tablet  Pt states she is taking 2 tablets per day, and the rx at the pharmacy was only for 30 tabs.  Pt requesting another rx for 30 tabs.     Preferred Pharmacy: [place], [place] [phone] (Phone) [phone] (Fax)   Pt was advised that RX refills may take up to 3 business days. Pt: [phone]

## 2018-06-05 NOTE — Telephone Encounter (Signed)
fyi

## 2018-06-06 ENCOUNTER — Telehealth: Payer: Self-pay | Admitting: Family Medicine

## 2018-06-06 NOTE — Telephone Encounter (Signed)
Copied from [place] [phone]. Topic: Quick Communication - Rx Refill/Question >> Jun 05, 2018  1:27 PM Burchel, Abbi R wrote: Medication: LORazepam (ATIVAN) 1 MG tablet  Pt states she is taking 2 tablets per day, and the rx at the pharmacy was only for 30 tabs.  Pt requesting another rx for 30 tabs.     Preferred Pharmacy: [place], [place] [phone] (Phone) [phone] (Fax)   Pt was advised that RX refills may take up to 3 business days. Pt: [phone]

## 2018-06-07 ENCOUNTER — Telehealth: Payer: Self-pay

## 2018-06-07 NOTE — Telephone Encounter (Signed)
Copied from [place] [phone]. Topic: Quick Communication - Rx Refill/Question >> Jun 05, 2018  1:27 PM Burchel, Abbi R wrote: Medication: LORazepam (ATIVAN) 1 MG tablet  Pt states she is taking 2 tablets per day, and the rx at the pharmacy was only for 30 tabs.  Pt requesting another rx for 30 tabs.     Preferred Pharmacy: [place], [place] [phone] (Phone) [phone] (Fax)   Pt was advised that RX refills may take up to 3 business days. Pt: [phone]   >> Jun 06, 2018  2:59 PM Neva Seat wrote: Pt didn't pick up refill  for the 30 tablets.   They put the refill back since pt didn't pick it up.  She is needing a new Rx written for 60 tablets to last for 1 month while pt is out on vacation.     Call pt's cell [phone]

## 2018-06-07 NOTE — Telephone Encounter (Signed)
Please advise 

## 2018-06-10 ENCOUNTER — Ambulatory Visit: Payer: BLUE CROSS/BLUE SHIELD | Admitting: Podiatry

## 2018-06-10 MED ORDER — LORAZEPAM 1 MG PO TABS: ORAL_TABLET | ORAL | 1 refills | Status: DC

## 2018-06-10 NOTE — Telephone Encounter (Signed)
Patient states she has not picked up the rx for 30 tablets. Please send in rx for 60

## 2018-06-10 NOTE — Telephone Encounter (Signed)
Sent to pharmacy 

## 2018-06-10 NOTE — Telephone Encounter (Signed)
Patient calling to check on the status of the ativan prescription. Please advise.

## 2018-06-10 NOTE — Addendum Note (Signed)
Addended by: Leone Haven on: 06/10/2018 04:34 PM   Modules accepted: Orders

## 2018-06-11 ENCOUNTER — Telehealth: Payer: Self-pay

## 2018-06-11 NOTE — Telephone Encounter (Signed)
Patient notified that medication has been sent to [facility]

## 2018-06-11 NOTE — Telephone Encounter (Signed)
Copied from [place] [phone]. Topic: Inquiry >> Jun 11, 2018 10:43 AM Mylinda Latina, NT wrote: Reason for CRM: Patient called and states that she talked to the nurse yesterday and she would like to talk to her again . CB# [phone]

## 2018-07-09 ENCOUNTER — Encounter

## 2018-07-09 ENCOUNTER — Encounter: Payer: Self-pay | Admitting: Family Medicine

## 2018-07-09 ENCOUNTER — Ambulatory Visit (INDEPENDENT_AMBULATORY_CARE_PROVIDER_SITE_OTHER): Payer: BLUE CROSS/BLUE SHIELD | Admitting: Family Medicine

## 2018-07-09 VITALS — BP 118/70 | HR 74 | Temp 98.7°F | Resp 16 | Wt 157.0 lb

## 2018-07-09 DIAGNOSIS — F32A Depression, unspecified: Secondary | ICD-10-CM

## 2018-07-09 DIAGNOSIS — F329 Major depressive disorder, single episode, unspecified: Secondary | ICD-10-CM

## 2018-07-09 DIAGNOSIS — F419 Anxiety disorder, unspecified: Secondary | ICD-10-CM

## 2018-07-09 DIAGNOSIS — R1013 Epigastric pain: Secondary | ICD-10-CM

## 2018-07-09 DIAGNOSIS — G8929 Other chronic pain: Secondary | ICD-10-CM | POA: Diagnosis not present

## 2018-07-09 DIAGNOSIS — R109 Unspecified abdominal pain: Secondary | ICD-10-CM | POA: Diagnosis not present

## 2018-07-09 DIAGNOSIS — E663 Overweight: Secondary | ICD-10-CM | POA: Diagnosis not present

## 2018-07-09 DIAGNOSIS — G894 Chronic pain syndrome: Secondary | ICD-10-CM | POA: Diagnosis not present

## 2018-07-09 DIAGNOSIS — M25512 Pain in left shoulder: Secondary | ICD-10-CM

## 2018-07-09 NOTE — Assessment & Plan Note (Signed)
We will refer to psychiatry.  She will continue Trintellix and Ativan.

## 2018-07-09 NOTE — Patient Instructions (Signed)
Nice to see you. We will check lab work today. Please see GI as planned. Please see your pain specialist as well. We will get you referred to a psychiatrist. If you develop thoughts of harming yourself or you develop new numbness or weakness please seek medical attention immediately.

## 2018-07-09 NOTE — Assessment & Plan Note (Signed)
Chronic issue.  Somewhat of a slight recurrence recently.  Will check labs as outlined below.  She will likely need to follow-up with GI.

## 2018-07-09 NOTE — Assessment & Plan Note (Signed)
Discussed continuing to follow with pain management.  She will continue to discuss her post radiofrequency ablation issues with them.  She sees them next week.

## 2018-07-09 NOTE — Progress Notes (Signed)
Tommi Rumps, MD Phone: [phone]  Nicole Walter is a 57 y.o. female who presents today for f/u.  CC: Depression/anxiety, chronic pain, chronic abdominal pain, overweight  Depression/anxiety: She notes these are not very good at this time.  Her sister is not doing well and is on hospice.  She continues to be worried about her granddaughter.  She is on Trintellix and Ativan.  She notes no drowsiness or alcohol use with the Ativan.  No SI.  She is requesting a psychiatry referral.  Chronic pain: She follows with pain management.  She reports having had a radiofrequency ablation in her neck on the left that resulted in some numbness of her left scalp.  She has followed up with pain management regarding that.  She is unsure what the plan is now though she is going to cancel the radiofrequency ablation on the right side.  She does note they restarted her on gabapentin though the high dose made her dizzy and she is now down to 600 mg nightly.  Overweight: She has not been exercising much.  She does still walk some.  She is trying to eat a healthy diet.  Chronic abdominal pain: Previously evaluated by GI.  She reports her pain did improve with the Linzess though over the last month or so she has had slight worsening.  She is status post cholecystectomy.  Notes it is in her upper abdomen and feels like a tightness at times.  It is not as bad as previously.  No constipation.  No dysuria, vaginal discharge, diarrhea, vomiting, or nausea.  She is postmenopausal.  Social History   Tobacco Use  Smoking Status Former Smoker  . Packs/day: 1.00  . Years: 35.00  . Pack years: 35.00  . Start date: 03/06/1974  . Last attempt to quit: 07/08/2015  . Years since quitting: 3.0  Smokeless Tobacco Never Used  Tobacco Comment   vapor cigarettes, no nicotene     ROS see history of present illness  Objective  Physical Exam Vitals:   07/09/18 1440  BP: 118/70  Pulse: 74  Resp: 16  Temp: 98.7 F  (37.1 C)  SpO2: 98%    BP Readings from Last 3 Encounters:  07/09/18 118/70  05/20/18 124/80  04/08/18 127/79   Wt Readings from Last 3 Encounters:  07/09/18 157 lb (71.2 kg)  05/20/18 157 lb (71.2 kg)  04/08/18 150 lb (68 kg)    Physical Exam  Constitutional: No distress.  Cardiovascular: Normal rate, regular rhythm and normal heart sounds.  Pulmonary/Chest: Effort normal and breath sounds normal.  Abdominal: Soft. Bowel sounds are normal.  Mild epigastric tenderness, no distention, guarding, or rebound  Musculoskeletal: She exhibits no edema.  Neurological: She is alert.  Skin: Skin is warm and dry. She is not diaphoretic.     Assessment/Plan: Please see individual problem list.  Anxiety and depression We will refer to psychiatry.  She will continue Trintellix and Ativan.  Chronic pain syndrome Discussed continuing to follow with pain management.  She will continue to discuss her post radiofrequency ablation issues with them.  She sees them next week.  Overweight (BMI 25.0-29.9) Encouraged diet and exercise.  Chronic abdominal pain Chronic issue.  Somewhat of a slight recurrence recently.  Will check labs as outlined below.  She will likely need to follow-up with GI.   Orders Placed This Encounter  Procedures  . Comp Met (CMET)  . CBC w/Diff  . Ambulatory referral to Psychiatry    Referral Priority:  Routine    Referral Type:   Psychiatric    Referral Reason:   Specialty Services Required    Requested Specialty:   Psychiatry    Number of Visits Requested:   1    No orders of the defined types were placed in this encounter.    Tommi Rumps, MD [place]

## 2018-07-09 NOTE — Assessment & Plan Note (Signed)
Encouraged diet and exercise.  

## 2018-07-10 ENCOUNTER — Other Ambulatory Visit: Payer: Self-pay | Admitting: Pain Medicine

## 2018-07-10 ENCOUNTER — Ambulatory Visit: Payer: BLUE CROSS/BLUE SHIELD | Admitting: Family Medicine

## 2018-07-10 LAB — COMPREHENSIVE METABOLIC PANEL
ALK PHOS: 81 U/L (ref 39–117)
ALT: 15 U/L (ref 0–35)
AST: 16 U/L (ref 0–37)
Albumin: 4.6 g/dL (ref 3.5–5.2)
BILIRUBIN TOTAL: 0.3 mg/dL (ref 0.2–1.2)
BUN: 15 mg/dL (ref 6–23)
CALCIUM: 9.5 mg/dL (ref 8.4–10.5)
CO2: 28 mEq/L (ref 19–32)
Chloride: 105 mEq/L (ref 96–112)
Creatinine, Ser: 0.92 mg/dL (ref 0.40–1.20)
GFR: 67.06 mL/min (ref >60.00)
Glucose, Bld: 92 mg/dL (ref 70–99)
POTASSIUM: 4.5 meq/L (ref 3.5–5.1)
Sodium: 140 mEq/L (ref 135–145)
TOTAL PROTEIN: 7.4 g/dL (ref 6.0–8.3)

## 2018-07-10 LAB — CBC WITH DIFFERENTIAL/PLATELET
BASOS ABS: 0.1 10*3/uL (ref 0.0–0.1)
Basophils Relative: 1 % (ref 0.0–3.0)
Eosinophils Absolute: 0.3 10*3/uL (ref 0.0–0.7)
Eosinophils Relative: 4.5 % (ref 0.0–5.0)
HEMATOCRIT: 39.3 % (ref 36.0–46.0)
HEMOGLOBIN: 13.4 g/dL (ref 12.0–15.0)
LYMPHS PCT: 42.3 % (ref 12.0–46.0)
Lymphs Abs: 3 10*3/uL (ref 0.7–4.0)
MCHC: 34 g/dL (ref 30.0–36.0)
MCV: 87 fl (ref 78.0–100.0)
MONOS PCT: 6 % (ref 3.0–12.0)
Monocytes Absolute: 0.4 10*3/uL (ref 0.1–1.0)
NEUTROS PCT: 46.2 % (ref 43.0–77.0)
Neutro Abs: 3.3 10*3/uL (ref 1.4–7.7)
PLATELETS: 297 10*3/uL (ref 150.0–400.0)
RBC: 4.52 Mil/uL (ref 3.87–5.11)
RDW: 13.7 % (ref 11.5–15.5)
WBC: 7.1 10*3/uL (ref 4.0–10.5)

## 2018-07-14 NOTE — Progress Notes (Signed)
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 reported episodes of acute onset apraxia, aphasia, dysarthria, agnosia, amnesia, paralysis, loss of coordination, or loss of consciousness  Allergies  Ms. Ey is allergic to other and food.  PFSH  Drug: Ms. Arca  reports that she does not use drugs. Alcohol:  reports that she does not drink alcohol. Tobacco:  reports that she quit smoking about 3 years ago. She started smoking about 44 years ago. She has a 35.00 pack-year smoking history. She has never used smokeless tobacco. Medical:  has a past medical history of Abnormal liver enzymes (02/08/2016), Allergy, Anxiety, Chest pain (07/05/2016), Chronic pain, Depression,  DJD (degenerative joint disease), Fibromyalgia, High cholesterol, Rash of back (01/28/2016), and Weight loss due to medication. Surgical: Ms. Kimery  has a past surgical history that includes Cholecystectomy; Tubal ligation; Ankle surgery; Nasal sinus surgery; Tonsillectomy;  Colonoscopy; Esophagogastroduodenoscopy (egd) with propofol (N/A, 02/22/2016); Colonoscopy with propofol (N/A, 02/22/2016); and Breast biopsy (Left, 1997). Family: family history includes Arthritis in her father, paternal grandfather, and paternal grandmother; Cancer in her brother and father; Dementia in her sister; Diabetes in her sister; Drug abuse in her brother, brother, and sister; Heart disease in her mother; Post-traumatic stress disorder in her brother; Stroke in her mother.  Constitutional Exam  General appearance: Well nourished, well developed, and well hydrated. In no apparent acute distress Vitals:   07/17/18 0804  BP: 113/74  Pulse: 60  Temp: 98.3 F (36.8 C)  SpO2: 100%  Weight: 156 lb (70.8 kg)  Height: 5' 4" (1.626 m)   BMI Assessment: Estimated body mass index is 26.78 kg/m as calculated from the following:   Height as of this encounter: 5' 4" (1.626 m).   Weight as of this encounter: 156 lb (70.8 kg).  BMI interpretation table: BMI level Category Range association with higher incidence of chronic pain  <18 kg/m2 Underweight   18.5-24.9 kg/m2 Ideal body weight   25-29.9 kg/m2 Overweight Increased incidence by 20%  30-34.9 kg/m2 Obese (Class I) Increased incidence by 68%  35-39.9 kg/m2 Severe obesity (Class II) Increased incidence by 136%  >40 kg/m2 Extreme obesity (Class III) Increased incidence by 254%   Patient's current BMI Ideal Body weight  Body mass index is 26.78 kg/m. Ideal body weight: 54.7 kg (120 lb 9.5 oz) Adjusted ideal body weight: 61.1 kg (134 lb 12.1 oz)   BMI Readings from Last 4 Encounters:  07/17/18 26.78 kg/m  07/09/18 26.95 kg/m  05/20/18 26.95 kg/m  04/08/18 25.75  kg/m   Wt Readings from Last 4 Encounters:  07/17/18 156 lb (70.8 kg)  07/09/18 157 lb (71.2 kg)  05/20/18 157 lb (71.2 kg)  04/08/18 150 lb (68 kg)  Psych/Mental status: Alert, oriented x 3 (person, place, & time)       Eyes: PERLA Respiratory: No evidence of acute respiratory distress  Cervical Spine Area Exam  Skin & Axial Inspection: No masses, redness, edema, swelling, or associated skin lesions Alignment: Symmetrical Functional ROM: Unrestricted ROM      Stability: No instability detected Muscle Tone/Strength: Functionally intact. No obvious neuro-muscular anomalies detected. Sensory (Neurological): Unimpaired Palpation: No palpable anomalies              Upper Extremity (UE) Exam    Side: Right upper extremity  Side: Left upper extremity  Skin & Extremity Inspection: Skin color, temperature, and hair growth are WNL. No peripheral edema or cyanosis. No masses, redness, swelling, asymmetry, or associated skin lesions. No contractures.  Skin & Extremity Inspection: Skin color, temperature, and hair growth are WNL. No peripheral edema or cyanosis. No masses, redness, swelling, asymmetry, or associated skin lesions. No contractures.  Functional ROM: Decreased ROM for shoulder  Functional ROM: Decreased ROM for shoulder  Muscle Tone/Strength: Functionally intact. No obvious neuro-muscular anomalies detected.  Muscle Tone/Strength: Functionally intact. No obvious neuro-muscular anomalies detected.  Sensory (Neurological): Unimpaired          Sensory (Neurological): Articular pain pattern affecting the shoulder  Palpation: No palpable anomalies              Palpation: No palpable anomalies              Provocative Test(s):  Phalen's test: deferred Tinel's test: deferred Apley's scratch test (touch opposite shoulder):  Action 1 (Across chest): deferred Action 2 (Overhead): deferred Action 3 (LB reach): deferred   Provocative Test(s):  Phalen's test: deferred Tinel's test:  deferred Apley's scratch test (touch opposite shoulder):  Action 1 (Across chest): Adequate ROM Action 2 (Overhead): Decreased ROM Action 3 (LB reach): Decreased ROM    Thoracic Spine Area Exam  Skin & Axial Inspection: No masses, redness, or swelling Alignment: Symmetrical Functional ROM: Unrestricted ROM Stability: No instability detected Muscle Tone/Strength: Functionally intact. No obvious neuro-muscular anomalies detected. Sensory (Neurological): Unimpaired Muscle strength & Tone: No palpable anomalies  Lumbar Spine Area Exam  Skin & Axial Inspection: No masses, redness, or swelling Alignment: Symmetrical Functional ROM: Unrestricted ROM       Stability: No instability detected Muscle Tone/Strength: Functionally intact. No obvious neuro-muscular anomalies detected. Sensory (Neurological): Unimpaired Palpation: No palpable anomalies       Provocative Tests: Hyperextension/rotation test: deferred today       Lumbar quadrant test (Kemp's test): deferred today       Lateral bending test: deferred today       Patrick's Maneuver: deferred today                   FABER test: deferred today                   S-I anterior distraction/compression test: deferred today         S-I lateral compression test: deferred today         S-I Thigh-thrust test: deferred today         S-I Gaenslen's test: deferred today          Gait & Posture Assessment  Ambulation: Unassisted Gait: Relatively normal for age and body habitus Posture: WNL   Lower Extremity Exam    Side: Right lower extremity  Side: Left lower extremity  Stability: No instability observed          Stability: No instability observed          Skin & Extremity Inspection: Skin color, temperature, and hair growth are WNL. No peripheral edema or cyanosis. No masses, redness, swelling, asymmetry, or associated skin lesions. No contractures.  Skin & Extremity Inspection: Skin color, temperature, and hair growth are WNL. No  peripheral edema or cyanosis. No masses, redness, swelling, asymmetry, or associated skin lesions. No contractures.  Functional ROM: Unrestricted ROM                  Functional ROM: Unrestricted ROM                  Muscle Tone/Strength: Functionally intact. No obvious neuro-muscular anomalies detected.  Muscle Tone/Strength: Functionally intact. No obvious neuro-muscular anomalies detected.  Sensory (Neurological): Unimpaired  Sensory (Neurological): Unimpaired  Palpation: No palpable anomalies  Palpation: No palpable anomalies   Assessment  Primary Diagnosis & Pertinent Problem List: The primary encounter diagnosis was Chronic shoulder pain [facility] source of pain) (Bilateral) (L>R). Diagnoses of Chronic neck pain (Primary Source of Pain) (Bilateral) (L>R), Chronic upper back pain (Secondary source of pain) (Bilateral) (L>R), Chronic pain syndrome, Cervical facet hypertrophy (Bilateral), and Cervical central spinal stenosis were also pertinent to this visit.  Status Diagnosis  Worsening Improved Improved 1. Chronic shoulder pain (Tertiary source of pain) (Bilateral) (L>R)   2. Chronic neck pain (Primary Source of Pain) (Bilateral) (L>R)   3. Chronic upper back pain (Secondary source of pain) (Bilateral) (L>R)   4. Chronic pain syndrome   5. Cervical facet hypertrophy (Bilateral)   6. Cervical central spinal stenosis     Problems updated and reviewed during this visit:  Problem  Cervical facet hypertrophy (Bilateral)  Cervical central spinal stenosis   Plan of Care  Pharmacotherapy (Medications Ordered): No orders of the defined types were placed in this encounter.  Medications administered today: Ree Alcalde had no medications administered during this visit.   Procedure Orders     SHOULDER INJECTION Lab Orders  No laboratory test(s) ordered today   Imaging Orders  No imaging studies ordered today   Referral Orders  No referral(s) requested today    Interventional  management options: Planned, scheduled, and/or pending:   Therapeutic left IA shoulder injection #1 under fluoroscopic guidance and IV sedation    Considering:   Diagnostic bilateral cervical facet block Possible bilateral cervical facet radiofrequencyablation Diagnostic left-sided cervical epidural steroid injection Diagnostic bilateral intra-articular shoulder joint injection Diagnostic bilateral suprascapular nerve block Possible bilateral suprascapular nerve radiofrequencyablation Diagnostic bilateral lumbar facet block Possible bilateral lumbar facet radiofrequencyablation  Diagnostic bilateral intra-articular hip joint injection Diagnostic bilateral femoral and obturator tickler branch blocks Possible bilateral hip joint radiofrequencyablation  Diagnosticright intra-articular knee joint injection Possible series of 5, right-sided, intra-articular knee joint injections with Hyalgan.  Possible right sided genicular nerve block Possible right sided genicular nerve radiofrequencyablation Possible left-sided lumbar epidural steroid injection   Palliative PRN treatment(s):   Palliativebilateral suprascapular nerve block Palliativeright intra-articular knee joint injection   Provider-requested follow-up: Return for Procedure (w/ sedation): (L) IA Shoulder injection.  Future Appointments  Date Time Provider [place]  07/23/2018  9:15 AM Milinda Pointer, MD [facility] None  08/19/2018  9:15 AM Vevelyn Francois, NP [facility] None  10/22/2018 10:00 AM Harlin Heys, MD [facility] None  10/29/2018 11:30 AM Nicole Haven, MD [facility]   Primary Care Physician: Nicole Haven, MD Location: [facility] Note by: Nicole Cola, MD Date: 07/17/2018; Time: 8:56 AM

## 2018-07-17 ENCOUNTER — Ambulatory Visit: Payer: BLUE CROSS/BLUE SHIELD | Attending: Pain Medicine | Admitting: Pain Medicine

## 2018-07-17 ENCOUNTER — Encounter: Payer: Self-pay | Admitting: Pain Medicine

## 2018-07-17 ENCOUNTER — Other Ambulatory Visit: Payer: Self-pay

## 2018-07-17 VITALS — BP 113/74 | HR 60 | Temp 98.3°F | Ht 64.0 in | Wt 156.0 lb

## 2018-07-17 DIAGNOSIS — M5011 Cervical disc disorder with radiculopathy,  high cervical region: Secondary | ICD-10-CM | POA: Insufficient documentation

## 2018-07-17 DIAGNOSIS — M797 Fibromyalgia: Secondary | ICD-10-CM | POA: Diagnosis not present

## 2018-07-17 DIAGNOSIS — G8929 Other chronic pain: Secondary | ICD-10-CM

## 2018-07-17 DIAGNOSIS — M4802 Spinal stenosis, cervical region: Secondary | ICD-10-CM | POA: Insufficient documentation

## 2018-07-17 DIAGNOSIS — M542 Cervicalgia: Secondary | ICD-10-CM | POA: Insufficient documentation

## 2018-07-17 DIAGNOSIS — Z79891 Long term (current) use of opiate analgesic: Secondary | ICD-10-CM | POA: Insufficient documentation

## 2018-07-17 DIAGNOSIS — G894 Chronic pain syndrome: Secondary | ICD-10-CM | POA: Insufficient documentation

## 2018-07-17 DIAGNOSIS — R748 Abnormal levels of other serum enzymes: Secondary | ICD-10-CM | POA: Insufficient documentation

## 2018-07-17 DIAGNOSIS — L989 Disorder of the skin and subcutaneous tissue, unspecified: Secondary | ICD-10-CM | POA: Diagnosis not present

## 2018-07-17 DIAGNOSIS — F329 Major depressive disorder, single episode, unspecified: Secondary | ICD-10-CM | POA: Insufficient documentation

## 2018-07-17 DIAGNOSIS — M85811 Other specified disorders of bone density and structure, right shoulder: Secondary | ICD-10-CM | POA: Insufficient documentation

## 2018-07-17 DIAGNOSIS — E663 Overweight: Secondary | ICD-10-CM | POA: Diagnosis not present

## 2018-07-17 DIAGNOSIS — M25512 Pain in left shoulder: Secondary | ICD-10-CM | POA: Insufficient documentation

## 2018-07-17 DIAGNOSIS — M5126 Other intervertebral disc displacement, lumbar region: Secondary | ICD-10-CM | POA: Diagnosis not present

## 2018-07-17 DIAGNOSIS — M25561 Pain in right knee: Secondary | ICD-10-CM | POA: Diagnosis not present

## 2018-07-17 DIAGNOSIS — M47814 Spondylosis without myelopathy or radiculopathy, thoracic region: Secondary | ICD-10-CM | POA: Diagnosis not present

## 2018-07-17 DIAGNOSIS — M7061 Trochanteric bursitis, right hip: Secondary | ICD-10-CM | POA: Insufficient documentation

## 2018-07-17 DIAGNOSIS — R7982 Elevated C-reactive protein (CRP): Secondary | ICD-10-CM | POA: Diagnosis not present

## 2018-07-17 DIAGNOSIS — M160 Bilateral primary osteoarthritis of hip: Secondary | ICD-10-CM | POA: Insufficient documentation

## 2018-07-17 DIAGNOSIS — R0602 Shortness of breath: Secondary | ICD-10-CM | POA: Insufficient documentation

## 2018-07-17 DIAGNOSIS — M549 Dorsalgia, unspecified: Secondary | ICD-10-CM | POA: Diagnosis not present

## 2018-07-17 DIAGNOSIS — R102 Pelvic and perineal pain: Secondary | ICD-10-CM | POA: Insufficient documentation

## 2018-07-17 DIAGNOSIS — M5414 Radiculopathy, thoracic region: Secondary | ICD-10-CM | POA: Diagnosis not present

## 2018-07-17 DIAGNOSIS — M19011 Primary osteoarthritis, right shoulder: Secondary | ICD-10-CM | POA: Insufficient documentation

## 2018-07-17 DIAGNOSIS — M62838 Other muscle spasm: Secondary | ICD-10-CM | POA: Insufficient documentation

## 2018-07-17 DIAGNOSIS — Z79899 Other long term (current) drug therapy: Secondary | ICD-10-CM | POA: Insufficient documentation

## 2018-07-17 DIAGNOSIS — F419 Anxiety disorder, unspecified: Secondary | ICD-10-CM | POA: Insufficient documentation

## 2018-07-17 DIAGNOSIS — M47812 Spondylosis without myelopathy or radiculopathy, cervical region: Secondary | ICD-10-CM | POA: Insufficient documentation

## 2018-07-17 DIAGNOSIS — M25511 Pain in right shoulder: Secondary | ICD-10-CM

## 2018-07-17 DIAGNOSIS — Z87891 Personal history of nicotine dependence: Secondary | ICD-10-CM | POA: Insufficient documentation

## 2018-07-17 DIAGNOSIS — Z0001 Encounter for general adult medical examination with abnormal findings: Secondary | ICD-10-CM | POA: Diagnosis not present

## 2018-07-17 DIAGNOSIS — M4804 Spinal stenosis, thoracic region: Secondary | ICD-10-CM | POA: Insufficient documentation

## 2018-07-17 DIAGNOSIS — E7801 Familial hypercholesterolemia: Secondary | ICD-10-CM | POA: Insufficient documentation

## 2018-07-17 DIAGNOSIS — M48061 Spinal stenosis, lumbar region without neurogenic claudication: Secondary | ICD-10-CM | POA: Insufficient documentation

## 2018-07-17 DIAGNOSIS — M79605 Pain in left leg: Secondary | ICD-10-CM | POA: Insufficient documentation

## 2018-07-17 DIAGNOSIS — K5900 Constipation, unspecified: Secondary | ICD-10-CM | POA: Insufficient documentation

## 2018-07-17 DIAGNOSIS — M1711 Unilateral primary osteoarthritis, right knee: Secondary | ICD-10-CM | POA: Insufficient documentation

## 2018-07-17 NOTE — Patient Instructions (Signed)
____________________________________________________________________________________________  Preparing for Procedure with Sedation  Instructions: . Oral Intake: Do not eat or drink anything for at least 8 hours prior to your procedure. . Transportation: Public transportation is not allowed. Bring an adult driver. The driver must be physically present in our waiting room before any procedure can be started. . Physical Assistance: Bring an adult physically capable of assisting you, in the event you need help. This adult should keep you company at home for at least 6 hours after the procedure. . Blood Pressure Medicine: Take your blood pressure medicine with a sip of water the morning of the procedure. . Blood thinners: Notify our staff if you are taking any blood thinners. Depending on which one you take, there will be specific instructions on how and when to stop it. . Diabetics on insulin: Notify the staff so that you can be scheduled 1st case in the morning. If your diabetes requires high dose insulin, take only  of your normal insulin dose the morning of the procedure and notify the staff that you have done so. . Preventing infections: Shower with an antibacterial soap the morning of your procedure. . Build-up your immune system: Take 1000 mg of Vitamin C with every meal (3 times a day) the day prior to your procedure. . Antibiotics: Inform the staff if you have a condition or reason that requires you to take antibiotics before dental procedures. . Pregnancy: If you are pregnant, call and cancel the procedure. . Sickness: If you have a cold, fever, or any active infections, call and cancel the procedure. . Arrival: You must be in the facility at least 30 minutes prior to your scheduled procedure. . Children: Do not bring children with you. . Dress appropriately: Bring dark clothing that you would not mind if they get stained. . Valuables: Do not bring any jewelry or valuables.  Procedure  appointments are reserved for interventional treatments only. . No Prescription Refills. . No medication changes will be discussed during procedure appointments. . No disability issues will be discussed.  Reasons to call and reschedule or cancel your procedure: (Following these recommendations will minimize the risk of a serious complication.) . Surgeries: Avoid having procedures within 2 weeks of any surgery. (Avoid for 2 weeks before or after any surgery). . Flu Shots: Avoid having procedures within 2 weeks of a flu shots or . (Avoid for 2 weeks before or after immunizations). . Barium: Avoid having a procedure within 7-10 days after having had a radiological study involving the use of radiological contrast. (Myelograms, Barium swallow or enema study). . Heart attacks: Avoid any elective procedures or surgeries for the initial 6 months after a "Myocardial Infarction" (Heart Attack). . Blood thinners: It is imperative that you stop these medications before procedures. Let us know if you if you take any blood thinner.  . Infection: Avoid procedures during or within two weeks of an infection (including chest colds or gastrointestinal problems). Symptoms associated with infections include: Localized redness, fever, chills, night sweats or profuse sweating, burning sensation when voiding, cough, congestion, stuffiness, runny nose, sore throat, diarrhea, nausea, vomiting, cold or Flu symptoms, recent or current infections. It is specially important if the infection is over the area that we intend to treat. . Heart and lung problems: Symptoms that may suggest an active cardiopulmonary problem include: cough, chest pain, breathing difficulties or shortness of breath, dizziness, ankle swelling, uncontrolled high or unusually low blood pressure, and/or palpitations. If you are experiencing any of these symptoms, cancel   your procedure and contact your primary care physician for an evaluation.  Remember:   Regular Business hours are:  Monday to Thursday 8:00 AM to 4:00 PM  Provider's Schedule: Tanor Glaspy, MD:  Procedure days: Tuesday and Thursday 7:30 AM to 4:00 PM  Bilal [name], MD:  Procedure days: Monday and Wednesday 7:30 AM to 4:00 PM ____________________________________________________________________________________________    

## 2018-07-18 DIAGNOSIS — M4802 Spinal stenosis, cervical region: Secondary | ICD-10-CM | POA: Insufficient documentation

## 2018-07-18 DIAGNOSIS — M47812 Spondylosis without myelopathy or radiculopathy, cervical region: Secondary | ICD-10-CM | POA: Insufficient documentation

## 2018-07-23 ENCOUNTER — Ambulatory Visit
Admission: RE | Admit: 2018-07-23 | Discharge: 2018-07-23 | Disposition: A | Discharge status: Home / Self Care | Payer: BLUE CROSS/BLUE SHIELD | Source: Ambulatory Visit | Attending: Pain Medicine | Admitting: Pain Medicine

## 2018-07-23 ENCOUNTER — Encounter: Payer: Self-pay | Admitting: Pain Medicine

## 2018-07-23 ENCOUNTER — Telehealth: Payer: Self-pay | Admitting: Family Medicine

## 2018-07-23 ENCOUNTER — Ambulatory Visit (HOSPITAL_BASED_OUTPATIENT_CLINIC_OR_DEPARTMENT_OTHER): Payer: BLUE CROSS/BLUE SHIELD | Admitting: Pain Medicine

## 2018-07-23 ENCOUNTER — Other Ambulatory Visit: Payer: Self-pay

## 2018-07-23 VITALS — BP 135/68 | Temp 97.6°F | Resp 17 | Ht 64.0 in | Wt 158.0 lb

## 2018-07-23 DIAGNOSIS — F329 Major depressive disorder, single episode, unspecified: Secondary | ICD-10-CM

## 2018-07-23 DIAGNOSIS — M47812 Spondylosis without myelopathy or radiculopathy, cervical region: Secondary | ICD-10-CM

## 2018-07-23 DIAGNOSIS — F32A Depression, unspecified: Secondary | ICD-10-CM

## 2018-07-23 DIAGNOSIS — M4802 Spinal stenosis, cervical region: Secondary | ICD-10-CM | POA: Insufficient documentation

## 2018-07-23 DIAGNOSIS — M25512 Pain in left shoulder: Secondary | ICD-10-CM | POA: Diagnosis present

## 2018-07-23 DIAGNOSIS — M5412 Radiculopathy, cervical region: Secondary | ICD-10-CM

## 2018-07-23 DIAGNOSIS — G8929 Other chronic pain: Secondary | ICD-10-CM | POA: Insufficient documentation

## 2018-07-23 DIAGNOSIS — M7918 Myalgia, other site: Secondary | ICD-10-CM | POA: Insufficient documentation

## 2018-07-23 DIAGNOSIS — F419 Anxiety disorder, unspecified: Secondary | ICD-10-CM

## 2018-07-23 DIAGNOSIS — M79602 Pain in left arm: Secondary | ICD-10-CM

## 2018-07-23 DIAGNOSIS — F411 Generalized anxiety disorder: Secondary | ICD-10-CM | POA: Insufficient documentation

## 2018-07-23 MED ORDER — METHYLPREDNISOLONE ACETATE 80 MG/ML IJ SUSP
80.0000 mg | Freq: Once | INTRAMUSCULAR | Status: AC
  Administered 2018-07-23: 80 mg via INTRA_ARTICULAR
  Filled 2018-07-23: qty 1

## 2018-07-23 MED ORDER — ROPIVACAINE HCL 2 MG/ML IJ SOLN
9.0000 mL | Freq: Once | INTRAMUSCULAR | Status: AC
  Administered 2018-07-23: 10 mL via INTRA_ARTICULAR
  Filled 2018-07-23: qty 10

## 2018-07-23 MED ORDER — MIDAZOLAM HCL 5 MG/5ML IJ SOLN
1.0000 mg | INTRAMUSCULAR | Status: DC | PRN
  Administered 2018-07-23: 3 mg via INTRAVENOUS
  Filled 2018-07-23: qty 5

## 2018-07-23 MED ORDER — LIDOCAINE HCL 2 % IJ SOLN
20.0000 mL | Freq: Once | INTRAMUSCULAR | Status: AC
  Administered 2018-07-23: 400 mg
  Filled 2018-07-23: qty 20

## 2018-07-23 MED ORDER — IOPAMIDOL (ISOVUE-M 200) INJECTION 41%
10.0000 mL | Freq: Once | INTRAMUSCULAR | Status: AC
  Administered 2018-07-23: 10 mL via EPIDURAL
  Filled 2018-07-23: qty 10

## 2018-07-23 MED ORDER — LACTATED RINGERS IV SOLN
1000.0000 mL | Freq: Once | INTRAVENOUS | Status: AC
  Administered 2018-07-23: 1000 mL via INTRAVENOUS

## 2018-07-23 MED ORDER — FENTANYL CITRATE (PF) 100 MCG/2ML IJ SOLN
25.0000 ug | INTRAMUSCULAR | Status: DC | PRN
  Administered 2018-07-23: 100 ug via INTRAVENOUS
  Filled 2018-07-23: qty 2

## 2018-07-23 NOTE — Progress Notes (Signed)
Safety precautions to be maintained throughout the outpatient stay will include: orient to surroundings, keep bed in low position, maintain call bell within reach at all times, provide assistance with transfer out of bed and ambulation.  

## 2018-07-23 NOTE — Progress Notes (Signed)
Patient's Name: Nicole Walter  MRN: [id number]  Referring Provider: Leone Haven, MD  DOB: 1961-07-11  PCP: Leone Haven, MD  DOS: 07/23/2018  Note by: Gaspar Cola, MD  Service setting: Ambulatory outpatient  Specialty: Interventional Pain Management  Patient type: Established  Location: [facility] (AMB) Pain Management Facility  Visit type: Interventional Procedure   Primary Reason for Visit: Interventional Pain Management Treatment. CC: Shoulder Pain (left)  Procedure:          Anesthesia, Analgesia, Anxiolysis:  Type: Diagnostic Glenohumeral Joint (shoulder) Injection #1  CPT: 20610      Primary Purpose: Diagnostic Region: Superior Shoulder Area Level:  Shoulder Target Area: Glenohumeral Joint (shoulder) Approach: Anterior approach. Laterality: Left-Sided Position: Prone  Type: Moderate (Conscious) Sedation combined with Local Anesthesia Indication(s): Analgesia and Anxiety Route: Intravenous (IV) IV Access: Secured Sedation: Meaningful verbal contact was maintained at all times during the procedure  Local Anesthetic: Lidocaine 1-2%   Indications: 1. Chronic shoulder pain (Left)   2. Chronic upper extremity pain (Left)   3. Myofascial pain syndrome, cervical (trapezius) (Left)   4. Cervical Foraminal Stenosis (Severe) (Bilateral: C-5-6)   5. Cervical sensory radiculopathy at C5 (Bilateral)   6. Radicular pain of shoulder (Bilateral) (C5)   7. Cervical facet hypertrophy (Bilateral)   8. Cervical facet syndrome (Bilateral) (L>R)    Pain Score: Pre-procedure: 2 /10 Post-procedure: 0-No pain/10  Currently the patient is up to believe that the pain that she is experiencing in the area of the left shoulder is secondary to the shoulder joint itself.  She believes this to be the case due to the fact that she does have pathology on the right shoulder which has improved with treatment of the shoulder itself.  However, recent x-rays have failed to demonstrate any  significant pathology of this left shoulder.  However, she does have significant cervical pathology in the area of the C5 and C6 nerves that could explain some of her symptoms.  However, because of her believes, she will not let us move any further in terms of the treatment of her cervical spine.  Because there is the possibility that she may have some early shoulder pathology that might have not been manifested on the recent x-rays, today I have decided to do a diagnostic intra-articular left shoulder injection for the purpose of determining if her pain is indeed coming from the shoulder itself or from the cervical area.  If the patient attains complete or close to complete relief of the pain with the injection of local anesthetic in the shoulder itself, then this would confirm that the pathology resides in that shoulder and not in the cervical area.  However, if the patient experiences numbness in the shoulder, but she continues to have the pain, this would suggest the pain to be coming from the cervical region.  We will evaluate the results when she returns in 2 weeks for follow-up evaluation.  Pre-op Assessment:  Nicole Walter is a 57 y.o. (year old), female patient, seen today for interventional treatment. She  has a past surgical history that includes Cholecystectomy; Tubal ligation; Ankle surgery; Nasal sinus surgery; Tonsillectomy; Colonoscopy; Esophagogastroduodenoscopy (egd) with propofol (N/A, 02/22/2016); Colonoscopy with propofol (N/A, 02/22/2016); and Breast biopsy (Left, 1997). Nicole Walter has a current medication list which includes the following prescription(s): calcium carbonate-vit d-min, fluocinonide ointment, fluticasone, ibuprofen, linaclotide, lorazepam, multi-vitamins, naloxone, fish oil, oxycodone, oxycodone, rabeprazole, sucralfate, trintellix, gabapentin, and oxycodone, and the following Facility-Administered Medications: fentanyl, lactated ringers, and midazolam. Her  primarily concern today is  the Shoulder Pain (left)  Initial Vital Signs:  Pulse/HCG Rate:  ECG Heart Rate: 64 Temp: 98.3 F (36.8 C) Resp: 16 BP: 132/79 SpO2: 99 %  BMI: Estimated body mass index is 27.12 kg/m as calculated from the following:   Height as of this encounter: 5\' 4"  (1.626 m).   Weight as of this encounter: 158 lb (71.7 kg).  Risk Assessment: Allergies: Reviewed. She is allergic to other and food.  Allergy Precautions: None required Coagulopathies: Reviewed. None identified.  Blood-thinner therapy: None at this time Active Infection(s): Reviewed. None identified. [name] is afebrile  Site Confirmation: Ms. Strohm was asked to confirm the procedure and laterality before marking the site Procedure checklist: Completed Consent: Before the procedure and under the influence of no sedative(s), amnesic(s), or anxiolytics, the patient was informed of the treatment options, risks and possible complications. To fulfill our ethical and legal obligations, as recommended by the American Medical Association's Code of Ethics, I have informed the patient of my clinical impression; the nature and purpose of the treatment or procedure; the risks, benefits, and possible complications of the intervention; the alternatives, including doing nothing; the risk(s) and benefit(s) of the alternative treatment(s) or procedure(s); and the risk(s) and benefit(s) of doing nothing. The patient was provided information about the general risks and possible complications associated with the procedure. These may include, but are not limited to: failure to achieve desired goals, infection, bleeding, organ or nerve damage, allergic reactions, paralysis, and death. In addition, the patient was informed of those risks and complications associated to the procedure, such as failure to decrease pain; infection; bleeding; organ or nerve damage with subsequent damage to sensory, motor, and/or autonomic systems, resulting in permanent pain,  numbness, and/or weakness of one or several areas of the body; allergic reactions; (i.e.: anaphylactic reaction); and/or death. Furthermore, the patient was informed of those risks and complications associated with the medications. These include, but are not limited to: allergic reactions (i.e.: anaphylactic or anaphylactoid reaction(s)); adrenal axis suppression; blood sugar elevation that in diabetics may result in ketoacidosis or comma; water retention that in patients with history of congestive heart failure may result in shortness of breath, pulmonary edema, and decompensation with resultant heart failure; weight gain; swelling or edema; medication-induced neural toxicity; particulate matter embolism and blood vessel occlusion with resultant organ, and/or nervous system infarction; and/or aseptic necrosis of one or more joints. Finally, the patient was informed that Medicine is not an exact science; therefore, there is also the possibility of unforeseen or unpredictable risks and/or possible complications that may result in a catastrophic outcome. The patient indicated having understood very clearly. We have given the patient no guarantees and we have made no promises. Enough time was given to the patient to ask questions, all of which were answered to the patient's satisfaction. Ms. Mozingo has indicated that she wanted to continue with the procedure. Attestation: I, the ordering provider, attest that I have discussed with the patient the benefits, risks, side-effects, alternatives, likelihood of achieving goals, and potential problems during recovery for the procedure that I have provided informed consent. Date  Time: 07/23/2018  8:31 AM  Pre-Procedure Preparation:  Monitoring: As per clinic protocol. Respiration, ETCO2, SpO2, BP, heart rate and rhythm monitor placed and checked for adequate function Safety Precautions: Patient was assessed for positional comfort and pressure points before starting the  procedure. Time-out: I initiated and conducted the "Time-out" before starting the procedure, as per protocol. The patient was  asked to participate by confirming the accuracy of the "Time Out" information. Verification of the correct person, site, and procedure were performed and confirmed by me, the nursing staff, and the patient. "Time-out" conducted as per Joint Commission's Universal Protocol (UP.01.01.01). Time: 0918  Description of Procedure:          Area Prepped: Entire shoulder Area Prepping solution: ChloraPrep (2% chlorhexidine gluconate and 70% isopropyl alcohol) Safety Precautions: Aspiration looking for blood return was conducted prior to all injections. At no point did we inject any substances, as a needle was being advanced. No attempts were made at seeking any paresthesias. Safe injection practices and needle disposal techniques used. Medications properly checked for expiration dates. SDV (single dose vial) medications used. Description of the Procedure: Protocol guidelines were followed. The patient was placed in position over the procedure table. The target area was identified and the area prepped in the usual manner. Skin & deeper tissues infiltrated with local anesthetic. Appropriate amount of time allowed to pass for local anesthetics to take effect. The procedure needles were then advanced to the target area. Proper needle placement secured. Negative aspiration confirmed. Solution injected in intermittent fashion, asking for systemic symptoms every 0.5cc of injectate. The needles were then removed and the area cleansed, making sure to leave some of the prepping solution back to take advantage of its long term bactericidal properties.    Vitals:   07/23/18 0926 07/23/18 0934 07/23/18 0944 07/23/18 0952  BP: 117/71 125/84 124/63 135/68  Resp: 12 10 (!) 8 17  Temp:  97.7 F (36.5 C)  97.6 F (36.4 C)  TempSrc:  Temporal  Temporal  SpO2: 99% 95% 97% 97%  Weight:      Height:         Start Time: 0918 hrs. End Time: 0922 hrs. Materials:  Needle(s) Type: Spinal Needle Gauge: 22G Length: 3.5-in Medication(s): Please see orders for medications and dosing details.  Imaging Guidance (Non-Spinal):          Type of Imaging Technique: Fluoroscopy Guidance (Non-Spinal) Indication(s): Assistance in needle guidance and placement for procedures requiring needle placement in or near specific anatomical locations not easily accessible without such assistance. Exposure Time: Please see nurses notes. Contrast: None used. Fluoroscopic Guidance: I was personally present during the use of fluoroscopy. "Tunnel Vision Technique" used to obtain the best possible view of the target area. Parallax error corrected before commencing the procedure. "Direction-depth-direction" technique used to introduce the needle under continuous pulsed fluoroscopy. Once target was reached, antero-posterior, oblique, and lateral fluoroscopic projection used confirm needle placement in all planes. Images permanently stored in EMR. Interpretation: No contrast injected. I personally interpreted the imaging intraoperatively. Adequate needle placement confirmed in multiple planes. Permanent images saved into the patient's record.  Antibiotic Prophylaxis:   Anti-infectives (From admission, onward)   None     Indication(s): None identified  Post-operative Assessment:  Post-procedure Vital Signs:  Pulse/HCG Rate:  61 Temp: 97.6 F (36.4 C) Resp: 17 BP: 135/68 SpO2: 97 %  EBL: None  Complications: No immediate post-treatment complications observed by team, or reported by patient.  Note: The patient tolerated the entire procedure well. A repeat set of vitals were taken after the procedure and the patient was kept under observation following institutional policy, for this type of procedure. Post-procedural neurological assessment was performed, showing return to baseline, prior to discharge. The patient  was provided with post-procedure discharge instructions, including a section on how to identify potential problems. Should any problems arise concerning  this procedure, the patient was given instructions to immediately contact us, at any time, without hesitation. In any case, we plan to contact the patient by telephone for a follow-up status report regarding this interventional procedure.  Comments:  No additional relevant information.  Plan of Care    Imaging Orders     DG C-Arm 1-60 Min-No Report  Procedure Orders     SHOULDER INJECTION  Medications ordered for procedure: Meds ordered this encounter  Medications  . iopamidol (ISOVUE-M) 41 % intrathecal injection 10 mL    Must be Myelogram-compatible. If not available, you may substitute with a water-soluble, non-ionic, hypoallergenic, myelogram-compatible radiological contrast medium.  Marland Kitchen lidocaine (XYLOCAINE) 2 % (with pres) injection 400 mg  . midazolam (VERSED) 5 MG/5ML injection 1-2 mg    Make sure Flumazenil is available in the pyxis when using this medication. If oversedation occurs, administer 0.2 mg IV over 15 sec. If after 45 sec no response, administer 0.2 mg again over 1 min; may repeat at 1 min intervals; not to exceed 4 doses (1 mg)  . fentaNYL (SUBLIMAZE) injection 25-50 mcg    Make sure Narcan is available in the pyxis when using this medication. In the event of respiratory depression (RR< 8/min): Titrate NARCAN (naloxone) in increments of 0.1 to 0.2 mg IV at 2-3 minute intervals, until desired degree of reversal.  . lactated ringers infusion 1,000 mL  . ropivacaine (PF) 2 mg/mL (0.2%) (NAROPIN) injection 9 mL  . methylPREDNISolone acetate (DEPO-MEDROL) injection 80 mg   Medications administered: We administered iopamidol, lidocaine, midazolam, fentaNYL, lactated ringers, ropivacaine (PF) 2 mg/mL (0.2%), and methylPREDNISolone acetate.  See the medical record for exact dosing, route, and time of administration.  New  Prescriptions   No medications on file   Disposition: Discharge home  Discharge Date & Time: 07/23/2018; 0953 hrs.   Physician-requested Follow-up: Return for post-procedure eval (2 wks), w/ Dr. Dossie Arbour.  Future Appointments  Date Time Provider [place]  08/19/2018  9:15 AM Vevelyn Francois, NP [facility] None  10/22/2018 10:00 AM Harlin Heys, MD [facility] None  10/29/2018 11:30 AM Leone Haven, MD [facility]   Primary Care Physician: Leone Haven, MD Location: [facility] Note by: Gaspar Cola, MD Date: 07/23/2018; Time: 10:13 AM  Disclaimer:  Medicine is not an Chief Strategy Officer. The only guarantee in medicine is that nothing is guaranteed. It is important to note that the decision to proceed with this intervention was based on the information collected from the patient. The Data and conclusions were drawn from the patient's questionnaire, the interview, and the physical examination. Because the information was provided in large part by the patient, it cannot be guaranteed that it has not been purposely or unconsciously manipulated. Every effort has been made to obtain as much relevant data as possible for this evaluation. It is important to note that the conclusions that lead to this procedure are derived in large part from the available data. Always take into account that the treatment will also be dependent on availability of resources and existing treatment guidelines, considered by other Pain Management Practitioners as being common knowledge and practice, at the time of the intervention. For Medico-Legal purposes, it is also important to point out that variation in procedural techniques and pharmacological choices are the acceptable norm. The indications, contraindications, technique, and results of the above procedure should only be interpreted and judged by a Board-Certified Interventional Pain Specialist with extensive  familiarity and expertise  in the same exact procedure and technique.

## 2018-07-23 NOTE — Telephone Encounter (Signed)
Copied from [place] [id number]. Topic: Quick Communication - See Telephone Encounter >> Jul 23, 2018  4:52 PM Reyne Dumas L wrote: CRM for notification. See Telephone encounter for: 07/23/18.  Pt states that Dr. Caryl Bis made a referral for pt to see a psychiatrist.  Pt states that the psychiatrist stated that pt would need to come off of LORazepam (ATIVAN) 1 MG tablet before she could be seen.  Pt is not willing or prepared to do that.  Pt wants to know if she can have a referral to a therapist not a psychiatrist.  Pt can be reached at [phone]

## 2018-07-23 NOTE — Patient Instructions (Signed)

## 2018-07-24 ENCOUNTER — Telehealth: Payer: Self-pay | Admitting: *Deleted

## 2018-07-24 NOTE — Telephone Encounter (Signed)
Denies complications post procedure. 

## 2018-07-24 NOTE — Telephone Encounter (Signed)
Request for referral elsewhere

## 2018-07-24 NOTE — Telephone Encounter (Signed)
Sent to PCP please advise.   Thanks  

## 2018-07-25 ENCOUNTER — Ambulatory Visit: Payer: BLUE CROSS/BLUE SHIELD | Admitting: Pain Medicine

## 2018-07-25 NOTE — Telephone Encounter (Signed)
Noted.  Referred to [facility] for therapy.

## 2018-07-25 NOTE — Telephone Encounter (Signed)
Called and spoke with pt. Pt advised and voiced understanding.  

## 2018-08-14 ENCOUNTER — Ambulatory Visit: Payer: BLUE CROSS/BLUE SHIELD | Admitting: Pain Medicine

## 2018-08-15 ENCOUNTER — Telehealth: Payer: Self-pay

## 2018-08-15 NOTE — Telephone Encounter (Signed)
Can be addressed on her appointment on Monday.

## 2018-08-15 NOTE — Telephone Encounter (Signed)
She would like to get a shot for low back pain. Pain is just in her lower back and goes into hip on the right side.

## 2018-08-15 NOTE — Telephone Encounter (Signed)
Patient advised this will be discussed at appointment Monday.

## 2018-08-15 NOTE — Telephone Encounter (Signed)
Having pain across lower back, into right hip. Does not go down the leg. There are no standing orders for a lumbar procedure. Please advise.

## 2018-08-19 ENCOUNTER — Ambulatory Visit: Payer: BLUE CROSS/BLUE SHIELD | Attending: Nurse Practitioner | Admitting: Nurse Practitioner

## 2018-08-19 ENCOUNTER — Encounter: Payer: Self-pay | Admitting: Nurse Practitioner

## 2018-08-19 ENCOUNTER — Other Ambulatory Visit: Payer: Self-pay

## 2018-08-19 VITALS — BP 119/94 | Temp 98.1°F | Resp 18 | Ht 64.0 in | Wt 157.0 lb

## 2018-08-19 DIAGNOSIS — Z823 Family history of stroke: Secondary | ICD-10-CM | POA: Diagnosis not present

## 2018-08-19 DIAGNOSIS — G894 Chronic pain syndrome: Secondary | ICD-10-CM | POA: Diagnosis not present

## 2018-08-19 DIAGNOSIS — Z809 Family history of malignant neoplasm, unspecified: Secondary | ICD-10-CM | POA: Insufficient documentation

## 2018-08-19 DIAGNOSIS — M7061 Trochanteric bursitis, right hip: Secondary | ICD-10-CM

## 2018-08-19 DIAGNOSIS — Z79899 Other long term (current) drug therapy: Secondary | ICD-10-CM | POA: Diagnosis not present

## 2018-08-19 DIAGNOSIS — E785 Hyperlipidemia, unspecified: Secondary | ICD-10-CM | POA: Insufficient documentation

## 2018-08-19 DIAGNOSIS — G8929 Other chronic pain: Secondary | ICD-10-CM

## 2018-08-19 DIAGNOSIS — M7062 Trochanteric bursitis, left hip: Secondary | ICD-10-CM | POA: Diagnosis not present

## 2018-08-19 DIAGNOSIS — M25512 Pain in left shoulder: Secondary | ICD-10-CM | POA: Diagnosis not present

## 2018-08-19 DIAGNOSIS — F419 Anxiety disorder, unspecified: Secondary | ICD-10-CM | POA: Diagnosis not present

## 2018-08-19 DIAGNOSIS — F329 Major depressive disorder, single episode, unspecified: Secondary | ICD-10-CM | POA: Insufficient documentation

## 2018-08-19 DIAGNOSIS — M25511 Pain in right shoulder: Secondary | ICD-10-CM

## 2018-08-19 DIAGNOSIS — Z8249 Family history of ischemic heart disease and other diseases of the circulatory system: Secondary | ICD-10-CM | POA: Diagnosis not present

## 2018-08-19 DIAGNOSIS — M4722 Other spondylosis with radiculopathy, cervical region: Secondary | ICD-10-CM | POA: Diagnosis not present

## 2018-08-19 DIAGNOSIS — M47816 Spondylosis without myelopathy or radiculopathy, lumbar region: Secondary | ICD-10-CM | POA: Diagnosis not present

## 2018-08-19 DIAGNOSIS — Z833 Family history of diabetes mellitus: Secondary | ICD-10-CM | POA: Insufficient documentation

## 2018-08-19 DIAGNOSIS — M8580 Other specified disorders of bone density and structure, unspecified site: Secondary | ICD-10-CM | POA: Insufficient documentation

## 2018-08-19 DIAGNOSIS — M47894 Other spondylosis, thoracic region: Secondary | ICD-10-CM | POA: Diagnosis not present

## 2018-08-19 DIAGNOSIS — M47896 Other spondylosis, lumbar region: Secondary | ICD-10-CM | POA: Insufficient documentation

## 2018-08-19 DIAGNOSIS — M797 Fibromyalgia: Secondary | ICD-10-CM | POA: Insufficient documentation

## 2018-08-19 DIAGNOSIS — M160 Bilateral primary osteoarthritis of hip: Secondary | ICD-10-CM | POA: Insufficient documentation

## 2018-08-19 DIAGNOSIS — Z79891 Long term (current) use of opiate analgesic: Secondary | ICD-10-CM | POA: Diagnosis not present

## 2018-08-19 DIAGNOSIS — M4802 Spinal stenosis, cervical region: Secondary | ICD-10-CM | POA: Diagnosis not present

## 2018-08-19 DIAGNOSIS — Z87891 Personal history of nicotine dependence: Secondary | ICD-10-CM | POA: Insufficient documentation

## 2018-08-19 DIAGNOSIS — M545 Low back pain: Secondary | ICD-10-CM | POA: Diagnosis present

## 2018-08-19 MED ORDER — GABAPENTIN 300 MG PO CAPS: 300.0000 mg | ORAL_CAPSULE | Freq: Four times a day (QID) | ORAL | 0 refills | Status: DC

## 2018-08-19 MED ORDER — OXYCODONE HCL 5 MG PO TABS: 5.0000 mg | ORAL_TABLET | Freq: Four times a day (QID) | ORAL | 0 refills | Status: DC | PRN

## 2018-08-19 NOTE — Progress Notes (Signed)
Nursing Pain Medication Assessment:  Safety precautions to be maintained throughout the outpatient stay will include: orient to surroundings, keep bed in low position, maintain call bell within reach at all times, provide assistance with transfer out of bed and ambulation.  Medication Inspection Compliance: Pill count conducted under aseptic conditions, in front of the patient. Neither the pills nor the bottle was removed from the patient's sight at any time. Once count was completed pills were immediately returned to the patient in their original bottle.  Medication: Oxycodone IR Pill/Patch Count: 63 of 120 pills remain Pill/Patch Appearance: Markings consistent with prescribed medication Bottle Appearance: Standard pharmacy container. Clearly labeled. Filled Date: 09 / 27 / 2019 Last Medication intake:  Today

## 2018-08-19 NOTE — Patient Instructions (Addendum)
____________________________________________________________________________________________  Medication Rules  Applies to: All patients receiving prescriptions (written or electronic).  Pharmacy of record: Pharmacy where electronic prescriptions will be sent. If written prescriptions are taken to a different pharmacy, please inform the nursing staff. The pharmacy listed in the electronic medical record should be the one where you would like electronic prescriptions to be sent.  Prescription refills: Only during scheduled appointments. Applies to both, written and electronic prescriptions.  NOTE: The following applies primarily to controlled substances (Opioid* Pain Medications).   Patient's responsibilities: 1. Pain Pills: Bring all pain pills to every appointment (except for procedure appointments). 2. Pill Bottles: Bring pills in original pharmacy bottle. Always bring newest bottle. Bring bottle, even if empty. 3. Medication refills: You are responsible for knowing and keeping track of what medications you need refilled. The day before your appointment, write a list of all prescriptions that need to be refilled. Bring that list to your appointment and give it to the admitting nurse. Prescriptions will be written only during appointments. If you forget a medication, it will not be "Called in", "Faxed", or "electronically sent". You will need to get another appointment to get these prescribed. 4. Prescription Accuracy: You are responsible for carefully inspecting your prescriptions before leaving our office. Have the discharge nurse carefully go over each prescription with you, before taking them home. Make sure that your name is accurately spelled, that your address is correct. Check the name and dose of your medication to make sure it is accurate. Check the number of pills, and the written instructions to make sure they are clear and accurate. Make sure that you are given enough medication to last  until your next medication refill appointment. 5. Taking Medication: Take medication as prescribed. Never take more pills than instructed. Never take medication more frequently than prescribed. Taking less pills or less frequently is permitted and encouraged, when it comes to controlled substances (written prescriptions).  6. Inform other Doctors: Always inform, all of your healthcare providers, of all the medications you take. 7. Pain Medication from other Providers: You are not allowed to accept any additional pain medication from any other Doctor or Healthcare provider. There are two exceptions to this rule. (see below) In the event that you require additional pain medication, you are responsible for notifying us, as stated below. 8. Medication Agreement: You are responsible for carefully reading and following our Medication Agreement. This must be signed before receiving any prescriptions from our practice. Safely store a copy of your signed Agreement. Violations to the Agreement will result in no further prescriptions. (Additional copies of our Medication Agreement are available upon request.) 9. Laws, Rules, & Regulations: All patients are expected to follow all Federal and State Laws, Statutes, Rules, & Regulations. Ignorance of the Laws does not constitute a valid excuse. The use of any illegal substances is prohibited. 10. Adopted CDC guidelines & recommendations: Target dosing levels will be at or below 60 MME/day. Use of benzodiazepines** is not recommended.  Exceptions: There are only two exceptions to the rule of not receiving pain medications from other Healthcare Providers. 1. Exception #1 (Emergencies): In the event of an emergency (i.e.: accident requiring emergency care), you are allowed to receive additional pain medication. However, you are responsible for: As soon as you are able, call our office [phone], at any time of the day or night, and leave a message stating your name, the  date and nature of the emergency, and the name and dose of the medication   prescribed. In the event that your call is answered by a member of our staff, make sure to document and save the date, time, and the name of the person that took your information.  2. Exception #2 (Planned Surgery): In the event that you are scheduled by another doctor or dentist to have any type of surgery or procedure, you are allowed (for a period no longer than 30 days), to receive additional pain medication, for the acute post-op pain. However, in this case, you are responsible for picking up a copy of our "Post-op Pain Management for Surgeons" handout, and giving it to your surgeon or dentist. This document is available at our office, and does not require an appointment to obtain it. Simply go to our office during business hours (Monday-Thursday from 8:00 AM to 4:00 PM) (Friday 8:00 AM to 12:00 Noon) or if you have a scheduled appointment with [place], prior to your surgery, and ask for it by name. In addition, you will need to provide [place] with your name, name of your surgeon, type of surgery, and date of procedure or surgery.  *Opioid medications include: morphine, codeine, oxycodone, oxymorphone, hydrocodone, hydromorphone, meperidine, tramadol, tapentadol, buprenorphine, fentanyl, methadone. **Benzodiazepine medications include: diazepam (Valium), alprazolam (Xanax), clonazepam (Klonopine), lorazepam (Ativan), clorazepate (Tranxene), chlordiazepoxide (Librium), estazolam (Prosom), oxazepam (Serax), temazepam (Restoril), triazolam (Halcion) (Last updated: 01/03/2018) ____________________________________________________________________________________________   Oxycodone hcl 5 mg to begin filling on 09/05/18 x 3 months  Gabapentin escribed.   ____________________________________________________________________________________________  Preparing for Procedure with Sedation  Instructions: . Oral Intake: Do not eat or drink  anything for at least 8 hours prior to your procedure. . Transportation: Public transportation is not allowed. Bring an adult driver. The driver must be physically present in our waiting room before any procedure can be started. Marland Kitchen Physical Assistance: Bring an adult physically capable of assisting you, in the event you need help. This adult should keep you company at home for at least 6 hours after the procedure. . Blood Pressure Medicine: Take your blood pressure medicine with a sip of water the morning of the procedure. . Blood thinners: Notify our staff if you are taking any blood thinners. Depending on which one you take, there will be specific instructions on how and when to stop it. . Diabetics on insulin: Notify the staff so that you can be scheduled 1st case in the morning. If your diabetes requires high dose insulin, take only  of your normal insulin dose the morning of the procedure and notify the staff that you have done so. . Preventing infections: Shower with an antibacterial soap the morning of your procedure. . Build-up your immune system: Take 1000 mg of Vitamin C with every meal (3 times a day) the day prior to your procedure. Marland Kitchen Antibiotics: Inform the staff if you have a condition or reason that requires you to take antibiotics before dental procedures. . Pregnancy: If you are pregnant, call and cancel the procedure. . Sickness: If you have a cold, fever, or any active infections, call and cancel the procedure. . Arrival: You must be in the facility at least 30 minutes prior to your scheduled procedure. . Children: Do not bring children with you. . Dress appropriately: Bring dark clothing that you would not mind if they get stained. . Valuables: Do not bring any jewelry or valuables.  Procedure appointments are reserved for interventional treatments only. Marland Kitchen No Prescription Refills. . No medication changes will be discussed during procedure appointments. . No disability issues  will  be discussed.  Reasons to call and reschedule or cancel your procedure: (Following these recommendations will minimize the risk of a serious complication.) . Surgeries: Avoid having procedures within 2 weeks of any surgery. (Avoid for 2 weeks before or after any surgery). . Flu Shots: Avoid having procedures within 2 weeks of a flu shots or . (Avoid for 2 weeks before or after immunizations). . Barium: Avoid having a procedure within 7-10 days after having had a radiological study involving the use of radiological contrast. (Myelograms, Barium swallow or enema study). . Heart attacks: Avoid any elective procedures or surgeries for the initial 6 months after a "Myocardial Infarction" (Heart Attack). . Blood thinners: It is imperative that you stop these medications before procedures. Let us know if you if you take any blood thinner.  . Infection: Avoid procedures during or within two weeks of an infection (including chest colds or gastrointestinal problems). Symptoms associated with infections include: Localized redness, fever, chills, night sweats or profuse sweating, burning sensation when voiding, cough, congestion, stuffiness, runny nose, sore throat, diarrhea, nausea, vomiting, cold or Flu symptoms, recent or current infections. It is specially important if the infection is over the area that we intend to treat. Marland Kitchen Heart and lung problems: Symptoms that may suggest an active cardiopulmonary problem include: cough, chest pain, breathing difficulties or shortness of breath, dizziness, ankle swelling, uncontrolled high or unusually low blood pressure, and/or palpitations. If you are experiencing any of these symptoms, cancel your procedure and contact your primary care physician for an evaluation.  Remember:  Regular Business hours are:  Monday to Thursday 8:00 AM to 4:00 PM  Provider's Schedule: Milinda Pointer, MD:  Procedure days: Tuesday and Thursday 7:30 AM to 4:00 PM  Gillis Santa,  MD:  Procedure days: Monday and Wednesday 7:30 AM to 4:00 PM ____________________________________________________________________________________________  ____________________________________________________________________________________________  Preparing for Procedure with Sedation  Instructions: . Oral Intake: Do not eat or drink anything for at least 8 hours prior to your procedure. . Transportation: Public transportation is not allowed. Bring an adult driver. The driver must be physically present in our waiting room before any procedure can be started. Marland Kitchen Physical Assistance: Bring an adult physically capable of assisting you, in the event you need help. This adult should keep you company at home for at least 6 hours after the procedure. . Blood Pressure Medicine: Take your blood pressure medicine with a sip of water the morning of the procedure. . Blood thinners: Notify our staff if you are taking any blood thinners. Depending on which one you take, there will be specific instructions on how and when to stop it. . Diabetics on insulin: Notify the staff so that you can be scheduled 1st case in the morning. If your diabetes requires high dose insulin, take only  of your normal insulin dose the morning of the procedure and notify the staff that you have done so. . Preventing infections: Shower with an antibacterial soap the morning of your procedure. . Build-up your immune system: Take 1000 mg of Vitamin C with every meal (3 times a day) the day prior to your procedure. Marland Kitchen Antibiotics: Inform the staff if you have a condition or reason that requires you to take antibiotics before dental procedures. . Pregnancy: If you are pregnant, call and cancel the procedure. . Sickness: If you have a cold, fever, or any active infections, call and cancel the procedure. . Arrival: You must be in the facility at least 30 minutes prior to your scheduled  procedure. . Children: Do not bring children with  you. . Dress appropriately: Bring dark clothing that you would not mind if they get stained. . Valuables: Do not bring any jewelry or valuables.  Procedure appointments are reserved for interventional treatments only. Marland Kitchen No Prescription Refills. . No medication changes will be discussed during procedure appointments. . No disability issues will be discussed.  Reasons to call and reschedule or cancel your procedure: (Following these recommendations will minimize the risk of a serious complication.) . Surgeries: Avoid having procedures within 2 weeks of any surgery. (Avoid for 2 weeks before or after any surgery). . Flu Shots: Avoid having procedures within 2 weeks of a flu shots or . (Avoid for 2 weeks before or after immunizations). . Barium: Avoid having a procedure within 7-10 days after having had a radiological study involving the use of radiological contrast. (Myelograms, Barium swallow or enema study). . Heart attacks: Avoid any elective procedures or surgeries for the initial 6 months after a "Myocardial Infarction" (Heart Attack). . Blood thinners: It is imperative that you stop these medications before procedures. Let us know if you if you take any blood thinner.  . Infection: Avoid procedures during or within two weeks of an infection (including chest colds or gastrointestinal problems). Symptoms associated with infections include: Localized redness, fever, chills, night sweats or profuse sweating, burning sensation when voiding, cough, congestion, stuffiness, runny nose, sore throat, diarrhea, nausea, vomiting, cold or Flu symptoms, recent or current infections. It is specially important if the infection is over the area that we intend to treat. Marland Kitchen Heart and lung problems: Symptoms that may suggest an active cardiopulmonary problem include: cough, chest pain, breathing difficulties or shortness of breath, dizziness, ankle swelling, uncontrolled high or unusually low blood pressure, and/or  palpitations. If you are experiencing any of these symptoms, cancel your procedure and contact your primary care physician for an evaluation.  Remember:  Regular Business hours are:  Monday to Thursday 8:00 AM to 4:00 PM  Provider's Schedule: Milinda Pointer, MD:  Procedure days: Tuesday and Thursday 7:30 AM to 4:00 PM  Gillis Santa, MD:  Procedure days: Monday and Wednesday 7:30 AM to 4:00 PM ____________________________________________________________________________________________  Facet Blocks Patient Information  Description: The facets are joints in the spine between the vertebrae.  Like any joints in the body, facets can become irritated and painful.  Arthritis can also effect the facets.  By injecting steroids and local anesthetic in and around these joints, we can temporarily block the nerve supply to them.  Steroids act directly on irritated nerves and tissues to reduce selling and inflammation which often leads to decreased pain.  Facet blocks may be done anywhere along the spine from the neck to the low back depending upon the location of your pain.   After numbing the skin with local anesthetic (like Novocaine), a small needle is passed onto the facet joints under x-ray guidance.  You may experience a sensation of pressure while this is being done.  The entire block usually lasts about 15-25 minutes.   Conditions which may be treated by facet blocks:   Low back/buttock pain  Neck/shoulder pain  Certain types of headaches  Preparation for the injection:  1. Do not eat any solid food or dairy products within 8 hours of your appointment. 2. You may drink clear liquid up to 3 hours before appointment.  Clear liquids include water, black coffee, juice or soda.  No milk or cream please. 3. You may take your  regular medication, including pain medications, with a sip of water before your appointment.  Diabetics should hold regular insulin (if taken separately) and take 1/2  normal NPH dose the morning of the procedure.  Carry some sugar containing items with you to your appointment. 4. A driver must accompany you and be prepared to drive you home after your procedure. 5. Bring all your current medications with you. 6. An IV may be inserted and sedation may be given at the discretion of the physician. 7. A blood pressure cuff, EKG and other monitors will often be applied during the procedure.  Some patients may need to have extra oxygen administered for a short period. 8. You will be asked to provide medical information, including your allergies and medications, prior to the procedure.  We must know immediately if you are taking blood thinners (like Coumadin/Warfarin) or if you are allergic to IV iodine contrast (dye).  We must know if you could possible be pregnant.  Possible side-effects:   Bleeding from needle site  Infection (rare, may require surgery)  Nerve injury (rare)  Numbness & tingling (temporary)  Difficulty urinating (rare, temporary)  Spinal headache (a headache worse with upright posture)  Light-headedness (temporary)  Pain at injection site (serveral days)  Decreased blood pressure (rare, temporary)  Weakness in arm/leg (temporary)  Pressure sensation in back/neck (temporary)   Call if you experience:   Fever/chills associated with headache or increased back/neck pain  Headache worsened by an upright position  New onset, weakness or numbness of an extremity below the injection site  Hives or difficulty breathing (go to the emergency room)  Inflammation or drainage at the injection site(s)  Severe back/neck pain greater than usual  New symptoms which are concerning to you  Please note:  Although the local anesthetic injected can often make your back or neck feel good for several hours after the injection, the pain will likely return. It takes 3-7 days for steroids to work.  You may not notice any pain relief for at  least one week.  If effective, we will often do a series of 2-3 injections spaced 3-6 weeks apart to maximally decrease your pain.  After the initial series, you may be a candidate for a more permanent nerve block of the facets.  If you have any questions, please call #[phone] [facility]

## 2018-08-19 NOTE — Progress Notes (Signed)
Patient's Name: Nicole Walter  MRN: [id number]  Referring Provider: Leone Haven, MD  DOB: Sep 14, 1961  PCP: Leone Haven, MD  DOS: 08/19/2018  Note by: Vevelyn Francois NP  Service setting: Ambulatory outpatient  Specialty: Interventional Pain Management  Location: [facility] (AMB) Pain Management Facility    Patient type: Established    Primary Reason(s) for Visit: Encounter for prescription drug management. (Level of risk: moderate)  CC: Fibromyalgia and Back Pain (low)  HPI  Nicole Walter is a 57 y.o. year old, female patient, who comes today for a medication management evaluation. She has Anxiety and depression; Chronic pain syndrome; Lumbar spondylosis; Combined fat and carbohydrate induced hyperlipemia; Encounter for general adult medical examination with abnormal findings; Thoracic back pain; Chronic neck pain (Primary Source of Pain) (Bilateral) (L>R); Concentration deficit; Hyperlipidemia; Cervical spondylosis (Bilateral); Fibromyalgia; Osteopenia; Skin lesions; Chronic hip pain (Bilateral) (L>R); Bursitis of shoulder (Right); Chronic Greater trochanteric bursitis (Bilateral) (L>R); Long term current use of opiate analgesic; Long term prescription opiate use; Opiate use; Chronic upper back pain (Secondary source of pain) (Bilateral) (L>R); Chronic shoulder pain [facility] source of pain) (Bilateral) (L>R); Chronic knee pain (Right); Chronic upper extremity pain (Left); Chronic lower extremity pain (Bilateral) (L>R); Chronic abdominal pain; Elevated sedimentation rate; Elevated C-reactive protein (CRP); Overweight (BMI 25.0-29.9); Greater trochanteric bursitis (Right); Subacromial bursitis of shoulder joint (Right); Osteoarthritis of hip (Bilateral) (L>R); Thoracic radiculitis (R>L); Thoracic spondylosis; Shortness of breath; Osteoarthritis of shoulder (Right); Osteoarthritis of knee (Right); Chronic knee arthropathy (Right); Chronic hip pain (Right); Muscle spasm; Myofascial pain syndrome,  cervical (trapezius) (Left); Cervical facet syndrome (Bilateral) (L>R); Constipation; Chronic low back pain (Bilateral) (L>R); Cervical Foraminal Stenosis (Severe) (Bilateral: C-5-6); Cervical spondylosis with radiculopathy (Bilateral) (L>R); Cervical sensory radiculopathy at C5 (Bilateral); Radicular pain of shoulder (Bilateral) (C5); Chronic pelvic pain in female; Fatty liver; Spondylosis without myelopathy or radiculopathy, cervical region; Laceration of hand; Cervicalgia; Chronic shoulder pain (Left); Cervical facet hypertrophy (Bilateral); Cervical central spinal stenosis; and Generalized anxiety disorder on their problem list. Her primarily concern today is the Fibromyalgia and Back Pain (low)  Pain Assessment: Location: Lower Back Radiating: right ip Onset: More than a month ago Duration: Chronic pain Quality: Throbbing, Aching(bruised) Severity: 2 /10 (subjective, self-reported pain score)  Note: Reported level is compatible with observation.                          Effect on ADL:   Timing: Intermittent Modifying factors: medications, walking, avoiding prolonged sitting, stretching BP: (!) 119/94  HR:    Nicole Walter was last scheduled for an appointment on 05/20/2018 for medication management. During today's appointment we reviewed Nicole Walter's chronic pain status, as well as her outpatient medication regimen.  She admits that she is having increased right lower back pain.  She admits that the pain goes down into her buttocks and her outer hip.  She has some occasional pain that goes down to the knee.  She denies any numbness, tingling or weakness in her legs.  She admits that she has had interventional therapy in the past by Dr. Sharlet Salina which was effective for several years.  She is unaware of which type of procedure she had.  She has had intra-articular hip injections in the past which were also effective.  She admits that she does continue to have the upper back pain.  She admits that it is  worse when she is wearing a bra.  The patient  reports that she  does not use drugs. Her body mass index is 26.95 kg/m.  Further details on both, my assessment(s), as well as the proposed treatment plan, please see below.  Controlled Substance Pharmacotherapy Assessment REMS (Risk Evaluation and Mitigation Strategy)  Analgesic:Oxycodone one tablet by mouth 4times a day (65m/dayof hydrocodone) MME/day:369mday  ShHart RochesterRN  08/19/2018  9:28 AM  Sign at close encounter Nursing Pain Medication Assessment:  Safety precautions to be maintained throughout the outpatient stay will include: orient to surroundings, keep bed in low position, maintain call bell within reach at all times, provide assistance with transfer out of bed and ambulation.  Medication Inspection Compliance: Pill count conducted under aseptic conditions, in front of the patient. Neither the pills nor the bottle was removed from the patient's sight at any time. Once count was completed pills were immediately returned to the patient in their original bottle.  Medication: Oxycodone IR Pill/Patch Count: 63 of 120 pills remain Pill/Patch Appearance: Markings consistent with prescribed medication Bottle Appearance: Standard pharmacy container. Clearly labeled. Filled Date: 09 / 27 / 2019 Last Medication intake:  Today   Pharmacokinetics: Liberation and absorption (onset of action): WNL Distribution (time to peak effect): WNL Metabolism and excretion (duration of action): WNL         Pharmacodynamics: Desired effects: Analgesia: Ms. [place] >50% benefit. Functional ability: Patient reports that medication allows her to accomplish basic ADLs Clinically meaningful improvement in function (CMIF): Sustained CMIF goals met Perceived effectiveness: Described as relatively effective, allowing for increase in activities of daily living (ADL) Undesirable effects: Side-effects or Adverse reactions: None  reported Monitoring: [place] PMP: Online review of the past 1240-monthriod conducted. Compliant with practice rules and regulations Last UDS on record: Summary  Date Value Ref Range Status  05/20/2018 FINAL  Final    Comment:    ==================================================================== TOXASSURE SELECT 13 (MW) ==================================================================== Test                             Result       Flag       Units Drug Present and Declared for Prescription Verification   Lorazepam                      521          EXPECTED   ng/mg creat    Source of lorazepam is a scheduled prescription medication.   Oxycodone                      919          EXPECTED   ng/mg creat   Oxymorphone                    538          EXPECTED   ng/mg creat   Noroxycodone                   3527         EXPECTED   ng/mg creat   Noroxymorphone                 326          EXPECTED   ng/mg creat    Sources of oxycodone are scheduled prescription medications.    Oxymorphone, noroxycodone, and noroxymorphone are expected    metabolites of oxycodone. Oxymorphone is also available as a  scheduled prescription medication. ==================================================================== Test                      Result    Flag   Units      Ref Range   Creatinine              90               mg/dL      >=20 ==================================================================== Declared Medications:  The flagging and interpretation on this report are based on the  following declared medications.  Unexpected results may arise from  inaccuracies in the declared medications.  **Note: The testing scope of this panel includes these medications:  Lorazepam (Ativan)  Oxycodone (Roxicodone)  **Note: The testing scope of this panel does not include following  reported medications:  Calcium carbonate (Calcium carbonate/Vitamin D)  Fluticasone (Flonase)  Gabapentin  Ibuprofen   Linaclotide (Linzess)  Multivitamin  Naloxone (Narcan)  Omega-3 Fatty Acids (Fish Oil)  Rabeprazole  Sucralfate  Topical (Lidex)  Vitamin D (Calcium carbonate/Vitamin D)  Vortioxetine (Trintellix) ==================================================================== For clinical consultation, please call [phone]. ====================================================================    UDS interpretation: Compliant          Medication Assessment Form: Reviewed. Patient indicates being compliant with therapy Treatment compliance: Compliant Risk Assessment Profile: Aberrant behavior: See prior evaluations. None observed or detected today Comorbid factors increasing risk of overdose: See prior notes. No additional risks detected today Opioid risk tool (ORT) (Total Score): 2 Personal History of Substance Abuse (SUD-Substance use disorder):  Alcohol: Negative  Illegal Drugs: Negative  Rx Drugs: Negative  ORT Risk Level calculation: Low Risk Risk of substance use disorder (SUD): Low Opioid Risk Tool - 08/19/18 0928      Family History of Substance Abuse   Alcohol  Negative    Illegal Drugs  Positive Female    Rx Drugs  Negative      Personal History of Substance Abuse   Alcohol  Negative    Illegal Drugs  Negative    Rx Drugs  Negative      Age   Age between 29-45 years   No      History of Preadolescent Sexual Abuse   History of Preadolescent Sexual Abuse  Negative or Female      Psychological Disease   Psychological Disease  Negative    Depression  Negative      Total Score   Opioid Risk Tool Scoring  2    Opioid Risk Interpretation  Low Risk      ORT Scoring interpretation table:  Score <3 = Low Risk for SUD  Score between 4-7 = Moderate Risk for SUD  Score >8 = High Risk for Opioid Abuse   Risk Mitigation Strategies:  Patient Counseling: Covered Patient-Prescriber Agreement (PPA): Present and active  Notification to other healthcare providers:  Done  Pharmacologic Plan: No change in therapy, at this time.            Post-Procedure Assessment  07/23/2018 Procedure: Left intra-articular shoulder injection Pre-procedure pain score:  2/10 Post-procedure pain score: 0/10         Influential Factors: BMI: 26.95 kg/m Intra-procedural challenges: None observed.         Assessment challenges: None detected.              Reported side-effects: None.        Post-procedural adverse reactions or complications: None reported         Sedation: Please  see nurses note. When no sedatives are used, the analgesic levels obtained are directly associated to the effectiveness of the local anesthetics. However, when sedation is provided, the level of analgesia obtained during the initial 1 hour following the intervention, is believed to be the result of a combination of factors. These factors may include, but are not limited to: 1. The effectiveness of the local anesthetics used. 2. The effects of the analgesic(s) and/or anxiolytic(s) used. 3. The degree of discomfort experienced by the patient at the time of the procedure. 4. The patients ability and reliability in recalling and recording the events. 5. The presence and influence of possible secondary gains and/or psychosocial factors. Reported result: Relief experienced during the 1st hour after the procedure: 50 % (Ultra-Short Term Relief)            Interpretative annotation: Clinically appropriate result. Analgesia during this period is likely to be Local Anesthetic and/or IV Sedative (Analgesic/Anxiolytic) related.          Effects of local anesthetic: The analgesic effects attained during this period are directly associated to the localized infiltration of local anesthetics and therefore cary significant diagnostic value as to the etiological location, or anatomical origin, of the pain. Expected duration of relief is directly dependent on the pharmacodynamics of the local anesthetic used. Long-acting  (4-6 hours) anesthetics used.  Reported result: Relief during the next 4 to 6 hour after the procedure: 50 % (Short-Term Relief)            Interpretative annotation: Clinically appropriate result. Analgesia during this period is likely to be Local Anesthetic-related.          Long-term benefit: Defined as the period of time past the expected duration of local anesthetics (1 hour for short-acting and 4-6 hours for long-acting). With the possible exception of prolonged sympathetic blockade from the local anesthetics, benefits during this period are typically attributed to, or associated with, other factors such as analgesic sensory neuropraxia, antiinflammatory effects, or beneficial biochemical changes provided by agents other than the local anesthetics.  Reported result: Extended relief following procedure: 50 % (Long-Term Relief)            Interpretative annotation: Clinically possible results. Good relief. No permanent benefit expected. Inflammation plays a part in the etiology to the pain.          Current benefits: Defined as reported results that persistent at this point in time.   Analgesia: >50 %            Function: Somewhat improved ROM: Somewhat improved Interpretative annotation: Good relief.    Effective diagnostic intervention.          Interpretation: Results would suggest a successful diagnostic intervention.                  Plan:  Please see "Plan of Care" for details.                Laboratory Chemistry  Inflammation Markers (CRP: Acute Phase) (ESR: Chronic Phase) Lab Results  Component Value Date   CRP 2.1 (H) 07/20/2016   ESRSEDRATE 2 05/15/2017                         Rheumatology Markers Lab Results  Component Value Date   RF <10.0 08/31/2016                        Renal Function Markers Lab Results  Component Value Date   BUN 15 07/09/2018   CREATININE 0.92 07/09/2018   GFRAA >60 07/20/2016   GFRNONAA >60 07/20/2016                              Hepatic Function Markers Lab Results  Component Value Date   AST 16 07/09/2018   ALT 15 07/09/2018   ALBUMIN 4.6 07/09/2018   ALKPHOS 81 07/09/2018   HCVAB NEGATIVE 02/14/2017   LIPASE 13 01/15/2017                        Electrolytes Lab Results  Component Value Date   NA 140 07/09/2018   K 4.5 07/09/2018   CL 105 07/09/2018   CALCIUM 9.5 07/09/2018   MG 2.2 05/15/2017                        Neuropathy Markers Lab Results  Component Value Date   VITAMINB12 987 (H) 07/20/2016   HGBA1C 5.3 10/24/2017                        CNS Tests No results found for: COLORCSF, APPEARCSF, RBCCOUNTCSF, WBCCSF, POLYSCSF, LYMPHSCSF, EOSCSF, PROTEINCSF, GLUCCSF, JCVIRUS, CSFOLI, IGGCSF                      Bone Pathology Markers Lab Results  Component Value Date   25OHVITD1 58 07/20/2016   25OHVITD2 <1.0 07/20/2016   25OHVITD3 58 07/20/2016                         Coagulation Parameters Lab Results  Component Value Date   PLT 297.0 07/09/2018                        Cardiovascular Markers Lab Results  Component Value Date   TROPONINI <0.03 07/05/2016   HGB 13.4 07/09/2018   HCT 39.3 07/09/2018                         CA Markers No results found for: CEA, CA125, LABCA2                      Note: Lab results reviewed.  Recent Diagnostic Imaging Results  DG C-Arm 1-60 Min-No Report Fluoroscopy was utilized by the requesting physician.  No radiographic  interpretation.   Complexity Note: Imaging results reviewed. Results shared with Nicole Walter, using Layman's terms.                         Meds   Current Outpatient Medications:  .  fluocinonide ointment (LIDEX) 0.05 %, APPLY TOPICALLY TWICE A DAY TO ITCHY AREAS ONLY. as needed, Disp: , Rfl: 0 .  fluticasone (FLONASE) 50 MCG/ACT nasal spray, instill 2 sprays into each nostril once daily, Disp: 16 g, Rfl: 11 .  ibuprofen (ADVIL,MOTRIN) 800 MG tablet, Take 1 tablet (800 mg total) by mouth every 8 (eight) hours as  needed., Disp: 30 tablet, Rfl: 0 .  linaclotide (LINZESS) 145 MCG CAPS capsule, Take 145 mcg by mouth daily before breakfast., Disp: , Rfl:  .  LORazepam (ATIVAN) 1 MG tablet, take 1 tablet by mouth twice a day if needed for anxiety DO NO DRINK ALCOHOL WHILE TAKING THIS  MEDICATION, Disp: 60 tablet, Rfl: 1 .  Multiple Vitamin (MULTI-VITAMINS) TABS, Take 1 tablet by mouth daily. , Disp: , Rfl:  .  naloxone (NARCAN) nasal spray 4 mg/0.1 mL, Spray into one nostril. Repeat with second device into other nostril after 2-3 minutes if no or minimal response. Use in case of opioid overdose., Disp: 1 kit, Rfl: 0 .  Omega-3 Fatty Acids (FISH OIL) 1200 MG CAPS, Take by mouth., Disp: , Rfl:  .  [START ON 11/04/2018] oxyCODONE (OXY IR/ROXICODONE) 5 MG immediate release tablet, Take 1 tablet (5 mg total) by mouth every 6 (six) hours as needed for severe pain., Disp: 120 tablet, Rfl: 0 .  RABEprazole (ACIPHEX) 20 MG tablet, Take 20 mg by mouth as needed. , Disp: , Rfl:  .  sucralfate (CARAFATE) 1 g tablet, take 1 tablet by mouth three times a day if needed for abdominal pain, Disp: , Rfl: 0 .  TRINTELLIX 5 MG TABS tablet, take 3 tablets by mouth once daily, Disp: 90 tablet, Rfl: 3 .  gabapentin (NEURONTIN) 300 MG capsule, Take 1-3 capsules (300-900 mg total) by mouth 4 (four) times daily. Follow the written titration instructions., Disp: 360 capsule, Rfl: 0 .  [START ON 10/05/2018] oxyCODONE (OXY IR/ROXICODONE) 5 MG immediate release tablet, Take 1 tablet (5 mg total) by mouth every 6 (six) hours as needed for severe pain., Disp: 120 tablet, Rfl: 0 .  [START ON 09/05/2018] oxyCODONE (OXY IR/ROXICODONE) 5 MG immediate release tablet, Take 1 tablet (5 mg total) by mouth every 6 (six) hours as needed for severe pain., Disp: 120 tablet, Rfl: 0  ROS  Constitutional: Denies any fever or chills Gastrointestinal: No reported hemesis, hematochezia, vomiting, or acute GI distress Musculoskeletal: Denies any acute onset joint  swelling, redness, loss of ROM, or weakness Neurological: No reported episodes of acute onset apraxia, aphasia, dysarthria, agnosia, amnesia, paralysis, loss of coordination, or loss of consciousness  Allergies  Nicole Walter is allergic to other and food.  PFSH  Drug: Nicole Walter  reports that she does not use drugs. Alcohol:  reports that she does not drink alcohol. Tobacco:  reports that she quit smoking about 3 years ago. She started smoking about 44 years ago. She has a 35.00 pack-year smoking history. She uses smokeless tobacco. Medical:  has a past medical history of Abnormal liver enzymes (02/08/2016), Allergy, Anxiety, Chest pain (07/05/2016), Chronic pain, Depression, DJD (degenerative joint disease), Fibromyalgia, High cholesterol, Rash of back (01/28/2016), and Weight loss due to medication. Surgical: Nicole Walter  has a past surgical history that includes Cholecystectomy; Tubal ligation; Ankle surgery; Nasal sinus surgery; Tonsillectomy; Colonoscopy; Esophagogastroduodenoscopy (egd) with propofol (N/A, 02/22/2016); Colonoscopy with propofol (N/A, 02/22/2016); and Breast biopsy (Left, 1997). Family: family history includes Arthritis in her father, paternal grandfather, and paternal grandmother; Cancer in her brother and father; Dementia in her sister; Diabetes in her sister; Drug abuse in her brother, brother, and sister; Heart disease in her mother; Post-traumatic stress disorder in her brother; Stroke in her mother.  Constitutional Exam  General appearance: Well nourished, well developed, and well hydrated. In no apparent acute distress Vitals:   08/19/18 0931  BP: (!) 119/94  Resp: 18  Temp: 98.1 F (36.7 C)  TempSrc: Oral  SpO2: 99%  Weight: 157 lb (71.2 kg)  Height: '5\' 4"'  (1.626 m)  Psych/Mental status: Alert, oriented x 3 (person, place, & time)       Eyes: PERLA Respiratory: No evidence of acute respiratory distress  Lumbar Spine Area  Exam  Skin & Axial Inspection: No masses,  redness, or swelling Alignment: Symmetrical Functional ROM: Unrestricted ROM       Stability: No instability detected Muscle Tone/Strength: Functionally intact. No obvious neuro-muscular anomalies detected. Sensory (Neurological): Unimpaired Palpation: No palpable anomalies       Provocative Tests: Hyperextension/rotation test: Positive on the right for facet joint pain. Lumbar quadrant test (Kemp's test): deferred today       Lateral bending test: (+)      Right    Gait & Posture Assessment  Ambulation: Unassisted Gait: Relatively normal for age and body habitus Posture: WNL   Lower Extremity Exam    Side: Right lower extremity  Side: Left lower extremity  Stability: No instability observed          Stability: No instability observed          Skin & Extremity Inspection: Skin color, temperature, and hair growth are WNL. No peripheral edema or cyanosis. No masses, redness, swelling, asymmetry, or associated skin lesions. No contractures.  Skin & Extremity Inspection: Skin color, temperature, and hair growth are WNL. No peripheral edema or cyanosis. No masses, redness, swelling, asymmetry, or associated skin lesions. No contractures.  Functional ROM: Unrestricted ROM                  Functional ROM: Unrestricted ROM                  Muscle Tone/Strength: Functionally intact. No obvious neuro-muscular anomalies detected.  Muscle Tone/Strength: Functionally intact. No obvious neuro-muscular anomalies detected.  Sensory (Neurological): Unimpaired  Sensory (Neurological): Unimpaired  Palpation: No palpable anomalies  Palpation: No palpable anomalies   Assessment  Primary Diagnosis & Pertinent Problem List: The primary encounter diagnosis was Lumbar spondylosis. Diagnoses of Thoracic spondylosis, Chronic shoulder pain (Tertiary source of pain) (Bilateral) (L>R), Chronic Greater trochanteric bursitis (Bilateral) (L>R), Chronic pain syndrome, and Long term prescription opiate use were also  pertinent to this visit.  Status Diagnosis  Worsening Persistent Responding 1. Lumbar spondylosis   2. Thoracic spondylosis   3. Chronic shoulder pain (Tertiary source of pain) (Bilateral) (L>R)   4. Chronic Greater trochanteric bursitis (Bilateral) (L>R)   5. Chronic pain syndrome   6. Long term prescription opiate use     Problems updated and reviewed during this visit: No problems updated. Plan of Care  Pharmacotherapy (Medications Ordered): Meds ordered this encounter  Medications  . oxyCODONE (OXY IR/ROXICODONE) 5 MG immediate release tablet    Sig: Take 1 tablet (5 mg total) by mouth every 6 (six) hours as needed for severe pain.    Dispense:  120 tablet    Refill:  0    Do not place this medication, or any other prescription from our practice, on "Automatic Refill". Patient may have prescription filled one day early if pharmacy is closed on scheduled refill date.    Order Specific Question:   Supervising Provider    Answer:   Milinda Pointer [phone]  . oxyCODONE (OXY IR/ROXICODONE) 5 MG immediate release tablet    Sig: Take 1 tablet (5 mg total) by mouth every 6 (six) hours as needed for severe pain.    Dispense:  120 tablet    Refill:  0    Do not place this medication, or any other prescription from our practice, on "Automatic Refill". Patient may have prescription filled one day early if pharmacy is closed on scheduled refill date.    Order Specific Question:   Supervising Provider  AnswerMilinda Pointer [[id number]]  . oxyCODONE (OXY IR/ROXICODONE) 5 MG immediate release tablet    Sig: Take 1 tablet (5 mg total) by mouth every 6 (six) hours as needed for severe pain.    Dispense:  120 tablet    Refill:  0    Do not place this medication, or any other prescription from our practice, on "Automatic Refill". Patient may have prescription filled one day early if pharmacy is closed on scheduled refill date.    Order Specific Question:   Supervising Provider     Answer:   Milinda Pointer [phone]  . gabapentin (NEURONTIN) 300 MG capsule    Sig: Take 1-3 capsules (300-900 mg total) by mouth 4 (four) times daily. Follow the written titration instructions.    Dispense:  360 capsule    Refill:  0    Do not place this medication, or any other prescription from our practice, on "Automatic Refill". Patient may have prescription filled one day early if pharmacy is closed on scheduled refill date.    Order Specific Question:   Supervising Provider    Answer:   Milinda Pointer [[id number]]   New Prescriptions   No medications on file   Medications administered today: Nicole Walter had no medications administered during this visit. Lab-work, procedure(s), and/or referral(s): Orders Placed This Encounter  Procedures  . LUMBAR FACET(MEDIAL BRANCH NERVE BLOCK) MBNB  . ToxASSURE Select 13 (MW), Urine   Imaging and/or referral(s): None  Interventional management options: Planned, scheduled, and/or pending:   Diagnostic right lumbar facet nerve block   Considering:   Diagnostic bilateral cervical facet block Possible bilateral cervical facet radiofrequencyablation Diagnostic left-sided cervical epidural steroid injection Diagnostic bilateral intra-articular shoulder joint injection Diagnostic bilateral suprascapular nerve block Possible bilateral suprascapular nerve radiofrequencyablation Diagnostic bilateral lumbar facet block Possible bilateral lumbar facet radiofrequencyablation  Diagnostic bilateral intra-articular hip joint injection Diagnostic bilateral femoral and obturator tickler branch blocks Possible bilateral hip joint radiofrequencyablation  Diagnosticright intra-articular knee joint injection Possible series of 5, right-sided, intra-articular knee joint injections with Hyalgan.  Possible right sided genicular nerve block Possible right sided genicular nerve radiofrequencyablation Possible left-sided lumbar epidural  steroid injection   Palliative PRN treatment(s):   Palliativebilateral suprascapular nerve block Palliativeright intra-articular knee joint injection    Provider-requested follow-up: Return in about 3 months (around 11/19/2018) for MedMgmt, in addition, w/ Dr. Dossie Arbour, Procedure(w/Sedation).  Future Appointments  Date Time Provider [place]  08/26/2018 11:00 AM Buena Irish, LCSW [facility] None  08/29/2018  9:30 AM Milinda Pointer, MD [facility] None  09/05/2018  9:30 AM Gardiner Barefoot, DPM [facility]  10/22/2018 10:00 AM Harlin Heys, MD [facility] None  10/29/2018 11:30 AM Leone Haven, MD [facility]  02/20/2019  9:30 AM Vevelyn Francois, NP [facility] None   Primary Care Physician: Leone Haven, MD Location: [facility] Note by: Vevelyn Francois NP Date: 08/19/2018; Time: 2:05 PM  Pain Score Disclaimer: We use the NRS-11 scale. This is a self-reported, subjective measurement of pain severity with only modest accuracy. It is used primarily to identify changes within a particular patient. It must be understood that outpatient pain scales are significantly less accurate that those used for research, where they can be applied under ideal controlled circumstances with minimal exposure to variables. In reality, the score is likely to be a combination of pain intensity and pain affect, where pain affect describes the degree of emotional arousal or changes in action readiness caused by  the sensory experience of pain. Factors such as social and work situation, setting, emotional state, anxiety levels, expectation, and prior pain experience may influence pain perception and show large inter-individual differences that may also be affected by time variables.  Patient instructions provided during this appointment: Patient Instructions   ____________________________________________________________________________________________  Medication Rules  Applies to: All patients receiving prescriptions (written or electronic).  Pharmacy of record: Pharmacy where electronic prescriptions will be sent. If written prescriptions are taken to a different pharmacy, please inform the nursing staff. The pharmacy listed in the electronic medical record should be the one where you would like electronic prescriptions to be sent.  Prescription refills: Only during scheduled appointments. Applies to both, written and electronic prescriptions.  NOTE: The following applies primarily to controlled substances (Opioid* Pain Medications).   Patient's responsibilities: 1. Pain Pills: Bring all pain pills to every appointment (except for procedure appointments). 2. Pill Bottles: Bring pills in original pharmacy bottle. Always bring newest bottle. Bring bottle, even if empty. 3. Medication refills: You are responsible for knowing and keeping track of what medications you need refilled. The day before your appointment, write a list of all prescriptions that need to be refilled. Bring that list to your appointment and give it to the admitting nurse. Prescriptions will be written only during appointments. If you forget a medication, it will not be "Called in", "Faxed", or "electronically sent". You will need to get another appointment to get these prescribed. 4. Prescription Accuracy: You are responsible for carefully inspecting your prescriptions before leaving our office. Have the discharge nurse carefully go over each prescription with you, before taking them home. Make sure that your name is accurately spelled, that your address is correct. Check the name and dose of your medication to make sure it is accurate. Check the number of pills, and the written instructions to make sure they are clear and accurate. Make sure that you are given enough medication to  last until your next medication refill appointment. 5. Taking Medication: Take medication as prescribed. Never take more pills than instructed. Never take medication more frequently than prescribed. Taking less pills or less frequently is permitted and encouraged, when it comes to controlled substances (written prescriptions).  6. Inform other Doctors: Always inform, all of your healthcare providers, of all the medications you take. 7. Pain Medication from other Providers: You are not allowed to accept any additional pain medication from any other Doctor or Healthcare provider. There are two exceptions to this rule. (see below) In the event that you require additional pain medication, you are responsible for notifying us, as stated below. 8. Medication Agreement: You are responsible for carefully reading and following our Medication Agreement. This must be signed before receiving any prescriptions from our practice. Safely store a copy of your signed Agreement. Violations to the Agreement will result in no further prescriptions. (Additional copies of our Medication Agreement are available upon request.) 9. Laws, Rules, & Regulations: All patients are expected to follow all Federal and [employer], [employer], Rules, [employer]. Ignorance of the Laws does not constitute a valid excuse. The use of any illegal substances is prohibited. 10. Adopted CDC guidelines & recommendations: Target dosing levels will be at or below 60 MME/day. Use of benzodiazepines** is not recommended.  Exceptions: There are only two exceptions to the rule of not receiving pain medications from other Healthcare Providers. 1. Exception #1 (Emergencies): In the event of an emergency (i.e.: accident requiring emergency care), you are allowed to receive  additional pain medication. However, you are responsible for: As soon as you are able, call our office [phone], at any time of the day or night, and leave a message stating your  name, the date and nature of the emergency, and the name and dose of the medication prescribed. In the event that your call is answered by a member of our staff, make sure to document and save the date, time, and the name of the person that took your information.  2. Exception #2 (Planned Surgery): In the event that you are scheduled by another doctor or dentist to have any type of surgery or procedure, you are allowed (for a period no longer than 30 days), to receive additional pain medication, for the acute post-op pain. However, in this case, you are responsible for picking up a copy of our "Post-op Pain Management for Surgeons" handout, and giving it to your surgeon or dentist. This document is available at our office, and does not require an appointment to obtain it. Simply go to our office during business hours (Monday-Thursday from 8:00 AM to 4:00 PM) (Friday 8:00 AM to 12:00 Noon) or if you have a scheduled appointment with [place], prior to your surgery, and ask for it by name. In addition, you will need to provide [place] with your name, name of your surgeon, type of surgery, and date of procedure or surgery.  *Opioid medications include: morphine, codeine, oxycodone, oxymorphone, hydrocodone, hydromorphone, meperidine, tramadol, tapentadol, buprenorphine, fentanyl, methadone. **Benzodiazepine medications include: diazepam (Valium), alprazolam (Xanax), clonazepam (Klonopine), lorazepam (Ativan), clorazepate (Tranxene), chlordiazepoxide (Librium), estazolam (Prosom), oxazepam (Serax), temazepam (Restoril), triazolam (Halcion) (Last updated: 01/03/2018) ____________________________________________________________________________________________   Oxycodone hcl 5 mg to begin filling on 09/05/18 x 3 months  Gabapentin escribed.   ____________________________________________________________________________________________  Preparing for Procedure with Sedation  Instructions: . Oral Intake: Do not eat or  drink anything for at least 8 hours prior to your procedure. . Transportation: Public transportation is not allowed. Bring an adult driver. The driver must be physically present in our waiting room before any procedure can be started. Marland Kitchen Physical Assistance: Bring an adult physically capable of assisting you, in the event you need help. This adult should keep you company at home for at least 6 hours after the procedure. . Blood Pressure Medicine: Take your blood pressure medicine with a sip of water the morning of the procedure. . Blood thinners: Notify our staff if you are taking any blood thinners. Depending on which one you take, there will be specific instructions on how and when to stop it. . Diabetics on insulin: Notify the staff so that you can be scheduled 1st case in the morning. If your diabetes requires high dose insulin, take only  of your normal insulin dose the morning of the procedure and notify the staff that you have done so. . Preventing infections: Shower with an antibacterial soap the morning of your procedure. . Build-up your immune system: Take 1000 mg of Vitamin C with every meal (3 times a day) the day prior to your procedure. Marland Kitchen Antibiotics: Inform the staff if you have a condition or reason that requires you to take antibiotics before dental procedures. . Pregnancy: If you are pregnant, call and cancel the procedure. . Sickness: If you have a cold, fever, or any active infections, call and cancel the procedure. . Arrival: You must be in the facility at least 30 minutes prior to your scheduled procedure. . Children: Do not bring children with you. Percell Miller appropriately:  Bring dark clothing that you would not mind if they get stained. . Valuables: Do not bring any jewelry or valuables.  Procedure appointments are reserved for interventional treatments only. Marland Kitchen No Prescription Refills. . No medication changes will be discussed during procedure appointments. . No disability  issues will be discussed.  Reasons to call and reschedule or cancel your procedure: (Following these recommendations will minimize the risk of a serious complication.) . Surgeries: Avoid having procedures within 2 weeks of any surgery. (Avoid for 2 weeks before or after any surgery). . Flu Shots: Avoid having procedures within 2 weeks of a flu shots or . (Avoid for 2 weeks before or after immunizations). . Barium: Avoid having a procedure within 7-10 days after having had a radiological study involving the use of radiological contrast. (Myelograms, Barium swallow or enema study). . Heart attacks: Avoid any elective procedures or surgeries for the initial 6 months after a "Myocardial Infarction" (Heart Attack). . Blood thinners: It is imperative that you stop these medications before procedures. Let us know if you if you take any blood thinner.  . Infection: Avoid procedures during or within two weeks of an infection (including chest colds or gastrointestinal problems). Symptoms associated with infections include: Localized redness, fever, chills, night sweats or profuse sweating, burning sensation when voiding, cough, congestion, stuffiness, runny nose, sore throat, diarrhea, nausea, vomiting, cold or Flu symptoms, recent or current infections. It is specially important if the infection is over the area that we intend to treat. Marland Kitchen Heart and lung problems: Symptoms that may suggest an active cardiopulmonary problem include: cough, chest pain, breathing difficulties or shortness of breath, dizziness, ankle swelling, uncontrolled high or unusually low blood pressure, and/or palpitations. If you are experiencing any of these symptoms, cancel your procedure and contact your primary care physician for an evaluation.  Remember:  Regular Business hours are:  Monday to Thursday 8:00 AM to 4:00 PM  Provider's Schedule: Milinda Pointer, MD:  Procedure days: Tuesday and Thursday 7:30 AM to 4:00 PM  Gillis Santa, MD:  Procedure days: Monday and Wednesday 7:30 AM to 4:00 PM ____________________________________________________________________________________________  ____________________________________________________________________________________________  Preparing for Procedure with Sedation  Instructions: . Oral Intake: Do not eat or drink anything for at least 8 hours prior to your procedure. . Transportation: Public transportation is not allowed. Bring an adult driver. The driver must be physically present in our waiting room before any procedure can be started. Marland Kitchen Physical Assistance: Bring an adult physically capable of assisting you, in the event you need help. This adult should keep you company at home for at least 6 hours after the procedure. . Blood Pressure Medicine: Take your blood pressure medicine with a sip of water the morning of the procedure. . Blood thinners: Notify our staff if you are taking any blood thinners. Depending on which one you take, there will be specific instructions on how and when to stop it. . Diabetics on insulin: Notify the staff so that you can be scheduled 1st case in the morning. If your diabetes requires high dose insulin, take only  of your normal insulin dose the morning of the procedure and notify the staff that you have done so. . Preventing infections: Shower with an antibacterial soap the morning of your procedure. . Build-up your immune system: Take 1000 mg of Vitamin C with every meal (3 times a day) the day prior to your procedure. Marland Kitchen Antibiotics: Inform the staff if you have a condition or reason that requires you  to take antibiotics before dental procedures. . Pregnancy: If you are pregnant, call and cancel the procedure. . Sickness: If you have a cold, fever, or any active infections, call and cancel the procedure. . Arrival: You must be in the facility at least 30 minutes prior to your scheduled procedure. . Children: Do not bring children  with you. . Dress appropriately: Bring dark clothing that you would not mind if they get stained. . Valuables: Do not bring any jewelry or valuables.  Procedure appointments are reserved for interventional treatments only. Marland Kitchen No Prescription Refills. . No medication changes will be discussed during procedure appointments. . No disability issues will be discussed.  Reasons to call and reschedule or cancel your procedure: (Following these recommendations will minimize the risk of a serious complication.) . Surgeries: Avoid having procedures within 2 weeks of any surgery. (Avoid for 2 weeks before or after any surgery). . Flu Shots: Avoid having procedures within 2 weeks of a flu shots or . (Avoid for 2 weeks before or after immunizations). . Barium: Avoid having a procedure within 7-10 days after having had a radiological study involving the use of radiological contrast. (Myelograms, Barium swallow or enema study). . Heart attacks: Avoid any elective procedures or surgeries for the initial 6 months after a "Myocardial Infarction" (Heart Attack). . Blood thinners: It is imperative that you stop these medications before procedures. Let us know if you if you take any blood thinner.  . Infection: Avoid procedures during or within two weeks of an infection (including chest colds or gastrointestinal problems). Symptoms associated with infections include: Localized redness, fever, chills, night sweats or profuse sweating, burning sensation when voiding, cough, congestion, stuffiness, runny nose, sore throat, diarrhea, nausea, vomiting, cold or Flu symptoms, recent or current infections. It is specially important if the infection is over the area that we intend to treat. Marland Kitchen Heart and lung problems: Symptoms that may suggest an active cardiopulmonary problem include: cough, chest pain, breathing difficulties or shortness of breath, dizziness, ankle swelling, uncontrolled high or unusually low blood pressure,  and/or palpitations. If you are experiencing any of these symptoms, cancel your procedure and contact your primary care physician for an evaluation.  Remember:  Regular Business hours are:  Monday to Thursday 8:00 AM to 4:00 PM  Provider's Schedule: Milinda Pointer, MD:  Procedure days: Tuesday and Thursday 7:30 AM to 4:00 PM  Gillis Santa, MD:  Procedure days: Monday and Wednesday 7:30 AM to 4:00 PM ____________________________________________________________________________________________  Facet Blocks Patient Information  Description: The facets are joints in the spine between the vertebrae.  Like any joints in the body, facets can become irritated and painful.  Arthritis can also effect the facets.  By injecting steroids and local anesthetic in and around these joints, we can temporarily block the nerve supply to them.  Steroids act directly on irritated nerves and tissues to reduce selling and inflammation which often leads to decreased pain.  Facet blocks may be done anywhere along the spine from the neck to the low back depending upon the location of your pain.   After numbing the skin with local anesthetic (like Novocaine), a small needle is passed onto the facet joints under x-ray guidance.  You may experience a sensation of pressure while this is being done.  The entire block usually lasts about 15-25 minutes.   Conditions which may be treated by facet blocks:   Low back/buttock pain  Neck/shoulder pain  Certain types of headaches  Preparation for the injection:  1. Do not eat any solid food or dairy products within 8 hours of your appointment. 2. You may drink clear liquid up to 3 hours before appointment.  Clear liquids include water, black coffee, juice or soda.  No milk or cream please. 3. You may take your regular medication, including pain medications, with a sip of water before your appointment.  Diabetics should hold regular insulin (if taken separately) and take  1/2 normal NPH dose the morning of the procedure.  Carry some sugar containing items with you to your appointment. 4. A driver must accompany you and be prepared to drive you home after your procedure. 5. Bring all your current medications with you. 6. An IV may be inserted and sedation may be given at the discretion of the physician. 7. A blood pressure cuff, EKG and other monitors will often be applied during the procedure.  Some patients may need to have extra oxygen administered for a short period. 8. You will be asked to provide medical information, including your allergies and medications, prior to the procedure.  We must know immediately if you are taking blood thinners (like Coumadin/Warfarin) or if you are allergic to IV iodine contrast (dye).  We must know if you could possible be pregnant.  Possible side-effects:   Bleeding from needle site  Infection (rare, may require surgery)  Nerve injury (rare)  Numbness & tingling (temporary)  Difficulty urinating (rare, temporary)  Spinal headache (a headache worse with upright posture)  Light-headedness (temporary)  Pain at injection site (serveral days)  Decreased blood pressure (rare, temporary)  Weakness in arm/leg (temporary)  Pressure sensation in back/neck (temporary)   Call if you experience:   Fever/chills associated with headache or increased back/neck pain  Headache worsened by an upright position  New onset, weakness or numbness of an extremity below the injection site  Hives or difficulty breathing (go to the emergency room)  Inflammation or drainage at the injection site(s)  Severe back/neck pain greater than usual  New symptoms which are concerning to you  Please note:  Although the local anesthetic injected can often make your back or neck feel good for several hours after the injection, the pain will likely return. It takes 3-7 days for steroids to work.  You may not notice any pain relief for at  least one week.  If effective, we will often do a series of 2-3 injections spaced 3-6 weeks apart to maximally decrease your pain.  After the initial series, you may be a candidate for a more permanent nerve block of the facets.  If you have any questions, please call #[phone] [facility]

## 2018-08-24 LAB — TOXASSURE SELECT 13 (MW), URINE

## 2018-08-26 ENCOUNTER — Ambulatory Visit (INDEPENDENT_AMBULATORY_CARE_PROVIDER_SITE_OTHER): Payer: BLUE CROSS/BLUE SHIELD | Admitting: Psychology

## 2018-08-26 ENCOUNTER — Telehealth: Payer: Self-pay | Admitting: Family Medicine

## 2018-08-26 ENCOUNTER — Other Ambulatory Visit: Payer: Self-pay | Admitting: Family Medicine

## 2018-08-26 DIAGNOSIS — F331 Major depressive disorder, recurrent, moderate: Secondary | ICD-10-CM | POA: Diagnosis not present

## 2018-08-26 NOTE — Telephone Encounter (Signed)
Pt needs a refill on LORazepam (ATIVAN) 1 MG tablet sent to [facility] on file

## 2018-08-26 NOTE — Telephone Encounter (Signed)
*  sent to [facility] on File

## 2018-08-27 MED ORDER — LORAZEPAM 1 MG PO TABS: ORAL_TABLET | ORAL | 1 refills | Status: DC

## 2018-08-27 NOTE — Telephone Encounter (Signed)
Sent to pharmacy.  Controlled substance database reviewed. 

## 2018-08-27 NOTE — Telephone Encounter (Signed)
Last OV 07/09/2018   Last refilled 06/10/2018 disp 60 with 1 refill   Sent to PCP to advise

## 2018-08-27 NOTE — Telephone Encounter (Signed)
Last filled 07/10/18 Last office visit 07/09/18 Next office visit 10/29/18

## 2018-08-28 NOTE — Telephone Encounter (Signed)
Called and spoke with pt. Pt advised Rx has been sent into pharmacy to [facility].

## 2018-08-29 ENCOUNTER — Other Ambulatory Visit: Payer: Self-pay

## 2018-08-29 ENCOUNTER — Ambulatory Visit (HOSPITAL_BASED_OUTPATIENT_CLINIC_OR_DEPARTMENT_OTHER): Payer: BLUE CROSS/BLUE SHIELD | Admitting: Pain Medicine

## 2018-08-29 ENCOUNTER — Encounter: Payer: Self-pay | Admitting: Pain Medicine

## 2018-08-29 ENCOUNTER — Ambulatory Visit
Admission: RE | Admit: 2018-08-29 | Discharge: 2018-08-29 | Disposition: A | Discharge status: Home / Self Care | Payer: BLUE CROSS/BLUE SHIELD | Source: Ambulatory Visit | Attending: Pain Medicine | Admitting: Pain Medicine

## 2018-08-29 VITALS — BP 135/87 | HR 61 | Temp 98.2°F | Resp 17 | Ht 64.0 in | Wt 156.0 lb

## 2018-08-29 DIAGNOSIS — Z9049 Acquired absence of other specified parts of digestive tract: Secondary | ICD-10-CM | POA: Diagnosis not present

## 2018-08-29 DIAGNOSIS — M5134 Other intervertebral disc degeneration, thoracic region: Secondary | ICD-10-CM | POA: Insufficient documentation

## 2018-08-29 DIAGNOSIS — Z9889 Other specified postprocedural states: Secondary | ICD-10-CM | POA: Insufficient documentation

## 2018-08-29 DIAGNOSIS — M25511 Pain in right shoulder: Secondary | ICD-10-CM | POA: Diagnosis present

## 2018-08-29 DIAGNOSIS — M4606 Spinal enthesopathy, lumbar region: Secondary | ICD-10-CM | POA: Diagnosis not present

## 2018-08-29 DIAGNOSIS — M545 Low back pain: Secondary | ICD-10-CM

## 2018-08-29 DIAGNOSIS — M25512 Pain in left shoulder: Secondary | ICD-10-CM | POA: Diagnosis not present

## 2018-08-29 DIAGNOSIS — G8929 Other chronic pain: Secondary | ICD-10-CM | POA: Insufficient documentation

## 2018-08-29 DIAGNOSIS — M4724 Other spondylosis with radiculopathy, thoracic region: Secondary | ICD-10-CM | POA: Insufficient documentation

## 2018-08-29 DIAGNOSIS — M47816 Spondylosis without myelopathy or radiculopathy, lumbar region: Secondary | ICD-10-CM | POA: Insufficient documentation

## 2018-08-29 DIAGNOSIS — M25552 Pain in left hip: Secondary | ICD-10-CM | POA: Insufficient documentation

## 2018-08-29 DIAGNOSIS — M549 Dorsalgia, unspecified: Secondary | ICD-10-CM

## 2018-08-29 DIAGNOSIS — M25551 Pain in right hip: Secondary | ICD-10-CM | POA: Insufficient documentation

## 2018-08-29 DIAGNOSIS — Z9851 Tubal ligation status: Secondary | ICD-10-CM | POA: Diagnosis not present

## 2018-08-29 DIAGNOSIS — M5414 Radiculopathy, thoracic region: Secondary | ICD-10-CM

## 2018-08-29 MED ORDER — SODIUM CHLORIDE 0.9% FLUSH
2.0000 mL | Freq: Once | INTRAVENOUS | Status: AC
  Administered 2018-08-29: 2 mL

## 2018-08-29 MED ORDER — FENTANYL CITRATE (PF) 100 MCG/2ML IJ SOLN
25.0000 ug | INTRAMUSCULAR | Status: DC | PRN
  Administered 2018-08-29: 100 ug via INTRAVENOUS
  Filled 2018-08-29: qty 2

## 2018-08-29 MED ORDER — LIDOCAINE HCL 2 % IJ SOLN
20.0000 mL | Freq: Once | INTRAMUSCULAR | Status: AC
  Administered 2018-08-29: 400 mg
  Filled 2018-08-29: qty 40

## 2018-08-29 MED ORDER — METHYLPREDNISOLONE ACETATE 80 MG/ML IJ SUSP
80.0000 mg | Freq: Once | INTRAMUSCULAR | Status: AC
  Administered 2018-08-29: 80 mg via INTRA_ARTICULAR
  Filled 2018-08-29: qty 1

## 2018-08-29 MED ORDER — SODIUM CHLORIDE 0.9 % IJ SOLN
INTRAMUSCULAR | Status: AC
  Filled 2018-08-29: qty 10

## 2018-08-29 MED ORDER — LACTATED RINGERS IV SOLN
1000.0000 mL | Freq: Once | INTRAVENOUS | Status: AC
  Administered 2018-08-29: 1000 mL via INTRAVENOUS

## 2018-08-29 MED ORDER — IOPAMIDOL (ISOVUE-M 200) INJECTION 41%
10.0000 mL | Freq: Once | INTRAMUSCULAR | Status: AC
  Administered 2018-08-29: 10 mL via EPIDURAL
  Filled 2018-08-29: qty 10

## 2018-08-29 MED ORDER — ROPIVACAINE HCL 2 MG/ML IJ SOLN: 9.0000 mL | Freq: Once | INTRAMUSCULAR | Status: DC

## 2018-08-29 MED ORDER — ROPIVACAINE HCL 2 MG/ML IJ SOLN
2.0000 mL | Freq: Once | INTRAMUSCULAR | Status: AC
  Administered 2018-08-29: 3 mL via EPIDURAL
  Filled 2018-08-29: qty 10

## 2018-08-29 MED ORDER — TRIAMCINOLONE ACETONIDE 40 MG/ML IJ SUSP
40.0000 mg | Freq: Once | INTRAMUSCULAR | Status: AC
  Administered 2018-08-29: 40 mg
  Filled 2018-08-29: qty 1

## 2018-08-29 MED ORDER — ROPIVACAINE HCL 2 MG/ML IJ SOLN
9.0000 mL | Freq: Once | INTRAMUSCULAR | Status: AC
  Administered 2018-08-29: 9 mL
  Filled 2018-08-29: qty 10

## 2018-08-29 MED ORDER — MIDAZOLAM HCL 5 MG/5ML IJ SOLN
1.0000 mg | INTRAMUSCULAR | Status: DC | PRN
  Administered 2018-08-29: 3 mg via INTRAVENOUS
  Filled 2018-08-29: qty 5

## 2018-08-29 NOTE — Progress Notes (Signed)
Patient's Name: Nicole Walter  MRN: [id number]  Referring Provider: Leone Haven, MD  DOB: 12-Oct-1961  PCP: Leone Haven, MD  DOS: 08/29/2018  Note by: Gaspar Cola, MD  Service setting: Ambulatory outpatient  Specialty: Interventional Pain Management  Patient type: Established  Location: [facility] (AMB) Pain Management Facility  Visit type: Interventional Procedure   Primary Reason for Visit: Interventional Pain Management Treatment. CC: Back Pain (low and upper); Shoulder Pain (left); and Hip Pain (right)  Procedure #1:  Anesthesia, Analgesia, Anxiolysis:  Type: Therapeutic Inter-Laminar Thoracic Epidural Block  #1  Region: Posterior Thoracolumbar Level: T9-10 Laterality: Right-Sided Paraspinal  Type: Moderate (Conscious) Sedation combined with Local Anesthesia Indication(s): Analgesia and Anxiety Route: Intravenous (IV) IV Access: Secured Sedation: Meaningful verbal contact was maintained at all times during the procedure  Local Anesthetic: Lidocaine 1-2%  Position: Prone   Indications: 1. DDD (degenerative disc disease), thoracic   2. Thoracic spondylosis with radiculopathy (Right)   3. Thoracic radiculitis (R>L)   4. Chronic upper back pain (Secondary Area of Pain) (Bilateral) (L>R)    Procedure #2:  Anesthesia, Analgesia, Anxiolysis:  Type: Ligament/Tendon sheath (44628) Injection of the L2-3 interspinous ligament.  #1  Purpose: Diagnostic Target Area: L2-3 interspinous ligament Region: Lumbar spine Approach: Percutanous Level: L2-3 Laterality:  (Midline)  Type: Moderate (Conscious) Sedation combined with Local Anesthesia Indication(s): Analgesia and Anxiety Local Anesthetic: Lidocaine 1-2% Route: Intravenous (IV) IV Access: Secured Sedation: Meaningful verbal contact was maintained at all times during the procedure   Position: Prone   Indications: 1. Spinal enthesopathy of lumbar region ([place])   2. Chronic midline low back pain without sciatica     Pain Score: Pre-procedure: 2 /10 Post-procedure: 0-No pain/10  Pre-op Assessment:  Nicole Walter is a 57 y.o. (year old), female patient, seen today for interventional treatment. She  has a past surgical history that includes Cholecystectomy; Tubal ligation; Ankle surgery; Nasal sinus surgery; Tonsillectomy; Colonoscopy; Esophagogastroduodenoscopy (egd) with propofol (N/A, 02/22/2016); Colonoscopy with propofol (N/A, 02/22/2016); and Breast biopsy (Left, 1997). [name] has a current medication list which includes the following prescription(s): dicyclomine, fluocinonide ointment, fluticasone, ibuprofen, linzess, lorazepam, multi-vitamins, naloxone, fish oil, oxycodone, oxycodone, oxycodone, rabeprazole, sucralfate, trintellix, gabapentin, and linaclotide, and the following Facility-Administered Medications: fentanyl and midazolam. Her primarily concern today is the Back Pain (low and upper); Shoulder Pain (left); and Hip Pain (right)  Today the patient was initially scheduled to come in for a bilateral lumbar facet block under fluoroscopic guidance and IV sedation.  However, upon examining the patient today the patient indicates that the pain is primarily in the upper region of the lower back and leg goes towards the right abdominal area following the distribution of a right thoracic radiculopathy.  Initial Vital Signs:  Pulse/HCG Rate: (!) 56ECG Heart Rate: 71 Temp: 98.4 F (36.9 C) Resp: 16 BP: 113/86 SpO2: 100 %  BMI: Estimated body mass index is 26.78 kg/m as calculated from the following:   Height as of this encounter: 5\' 4"  (1.626 m).   Weight as of this encounter: 156 lb (70.8 kg).  Risk Assessment: Allergies: Reviewed. She is allergic to other; food; and pea extract.  Allergy Precautions: None required Coagulopathies: Reviewed. None identified.  Blood-thinner therapy: None at this time Active Infection(s): Reviewed. None identified. [name] is afebrile  Site Confirmation: [name] was asked to confirm the procedure and laterality before marking the site Procedure checklist: Completed Consent: Before the procedure and under the influence of no sedative(s), amnesic(s), or anxiolytics,  the patient was informed of the treatment options, risks and possible complications. To fulfill our ethical and legal obligations, as recommended by the American Medical Association's Code of Ethics, I have informed the patient of my clinical impression; the nature and purpose of the treatment or procedure; the risks, benefits, and possible complications of the intervention; the alternatives, including doing nothing; the risk(s) and benefit(s) of the alternative treatment(s) or procedure(s); and the risk(s) and benefit(s) of doing nothing. The patient was provided information about the general risks and possible complications associated with the procedure. These may include, but are not limited to: failure to achieve desired goals, infection, bleeding, organ or nerve damage, allergic reactions, paralysis, and death. In addition, the patient was informed of those risks and complications associated to Spine-related procedures, such as failure to decrease pain; infection (i.e.: Meningitis, epidural or intraspinal abscess); bleeding (i.e.: epidural hematoma, subarachnoid hemorrhage, or any other type of intraspinal or peri-dural bleeding); organ or nerve damage (i.e.: Any type of peripheral nerve, nerve root, or spinal cord injury) with subsequent damage to sensory, motor, and/or autonomic systems, resulting in permanent pain, numbness, and/or weakness of one or several areas of the body; allergic reactions; (i.e.: anaphylactic reaction); and/or death. Furthermore, the patient was informed of those risks and complications associated with the medications. These include, but are not limited to: allergic reactions (i.e.: anaphylactic or anaphylactoid reaction(s)); adrenal axis suppression; blood sugar  elevation that in diabetics may result in ketoacidosis or comma; water retention that in patients with history of congestive heart failure may result in shortness of breath, pulmonary edema, and decompensation with resultant heart failure; weight gain; swelling or edema; medication-induced neural toxicity; particulate matter embolism and blood vessel occlusion with resultant organ, and/or nervous system infarction; and/or aseptic necrosis of one or more joints. Finally, the patient was informed that Medicine is not an exact science; therefore, there is also the possibility of unforeseen or unpredictable risks and/or possible complications that may result in a catastrophic outcome. The patient indicated having understood very clearly. We have given the patient no guarantees and we have made no promises. Enough time was given to the patient to ask questions, all of which were answered to the patient's satisfaction. Ms. Gunia has indicated that she wanted to continue with the procedure. Attestation: I, the ordering provider, attest that I have discussed with the patient the benefits, risks, side-effects, alternatives, likelihood of achieving goals, and potential problems during recovery for the procedure that I have provided informed consent. Date  Time: 08/29/2018  9:55 AM  Pre-Procedure Preparation:  Monitoring: As per clinic protocol. Respiration, ETCO2, SpO2, BP, heart rate and rhythm monitor placed and checked for adequate function Safety Precautions: Patient was assessed for positional comfort and pressure points before starting the procedure. Time-out: I initiated and conducted the "Time-out" before starting the procedure, as per protocol. The patient was asked to participate by confirming the accuracy of the "Time Out" information. Verification of the correct person, site, and procedure were performed and confirmed by me, the nursing staff, and the patient. "Time-out" conducted as per Joint  Commission's Universal Protocol (UP.01.01.01). Time: 1111  Description of Procedure #1:  Target Area: For Epidural Steroid injection(s), the target area is the  interlaminar space, initially targeting the lower border of the superior vertebral body lamina. Approach: Interlaminar approach. Area Prepped: Entire Posterior Thoracolumbar Region Prepping solution: ChloraPrep (2% chlorhexidine gluconate and 70% isopropyl alcohol) Safety Precautions: Aspiration looking for blood return was conducted prior to all injections. At no  point did we inject any substances, as a needle was being advanced. No attempts were made at seeking any paresthesias. Safe injection practices and needle disposal techniques used. Medications properly checked for expiration dates. SDV (single dose vial) medications used. Description of the Procedure: Protocol guidelines were followed. The patient was placed in position over the fluoroscopy table. The target area was identified and the area prepped in the usual manner. Skin & deeper tissues infiltrated with local anesthetic. Appropriate amount of time allowed to pass for local anesthetics to take effect. The procedure needles were then advanced to the target area. The inferior aspect of the superior lamina was contacted and the needle walked caudad, until the lamina was cleared. The epidural space was identified using "loss-of-resistance technique" with 0.9% PF-NSS (2-4mL), in a low friction 10cc LOR glass syringe. Proper needle placement was secured. Negative aspiration confirmed. Solution injected in intermittent fashion, asking for systemic symptoms every 0.5 cc of injectate. The needles were then removed and the area cleansed, making sure to leave some of the prepping solution behind to take advantage of its long term bactericidal properties.  Vitals:   08/29/18 1135 08/29/18 1145 08/29/18 1156 08/29/18 1204  BP: 128/65 116/65 (!) 139/92 135/87  Pulse:      Resp: 12 14 (!) 22 17   Temp: 98.3 F (36.8 C)   98.2 F (36.8 C)  TempSrc: Temporal   Temporal  SpO2: 96% 97% 100% 99%  Weight:      Height:        Start Time: 1111 hrs. End Time: 1124 hrs. Materials:  Needle(s) Type: Epidural needle Gauge: 17G Length: 3.5-in Medication(s): Please see orders for medications and dosing details.  Imaging Guidance (Spinal) for procedure #1:  Type of Imaging Technique: Fluoroscopy Guidance (Spinal) Indication(s): Assistance in needle guidance and placement for procedures requiring needle placement in or near specific anatomical locations not easily accessible without such assistance. Exposure Time: Please see nurses notes. Contrast: Before injecting any contrast, we confirmed that the patient did not have an allergy to iodine, shellfish, or radiological contrast. Once satisfactory needle placement was completed at the desired level, radiological contrast was injected. Contrast injected under live fluoroscopy. No contrast complications. See chart for type and volume of contrast used. Fluoroscopic Guidance: I was personally present during the use of fluoroscopy. "Tunnel Vision Technique" used to obtain the best possible view of the target area. Parallax error corrected before commencing the procedure. "Direction-depth-direction" technique used to introduce the needle under continuous pulsed fluoroscopy. Once target was reached, antero-posterior, oblique, and lateral fluoroscopic projection used confirm needle placement in all planes. Images permanently stored in EMR.    Interpretation: I personally interpreted the imaging intraoperatively. Adequate needle placement confirmed in multiple planes. Appropriate spread of contrast into desired area was observed. No evidence of afferent or efferent intravascular uptake. No intrathecal or subarachnoid spread observed. Permanent images saved into the patient's record.  A label can be seen in the image, on the left side which reads "L2/L3".   This label is wrong and it seems to have been carried forward from damage where we went into the L2-3 interspinous ligament.  Description of Procedure #2:  Area Prepped: Lumbosacral area Prepping solution: ChloraPrep (2% chlorhexidine gluconate and 70% isopropyl alcohol) Safety Precautions: Aspiration looking for blood return was conducted prior to all injections. At no point did we inject any substances, as a needle was being advanced. No attempts were made at seeking any paresthesias. Safe injection practices and needle disposal  techniques used. Medications properly checked for expiration dates. SDV (single dose vial) medications used. Description of the Procedure: Protocol guidelines were followed. The patient was placed in position. The target area was identified and prepped in the usual manner. Skin & deeper tissues infiltrated with local anesthetic. Appropriate time provided for local anesthetics to take effect. The procedure needle was slowly advanced to target area. Proper needle placement secured. Negative aspiration confirmed. Solution injected in intermittent fashion, asking for systemic symptoms every 0.5cc. Needle(s) removed and area cleaned, making sure to leave some prepping solution back to take advantage of its long term bactericidal properties.  Vitals:   08/29/18 1135 08/29/18 1145 08/29/18 1156 08/29/18 1204  BP: 128/65 116/65 (!) 139/92 135/87  Pulse:      Resp: 12 14 (!) 22 17  Temp: 98.3 F (36.8 C)   98.2 F (36.8 C)  TempSrc: Temporal   Temporal  SpO2: 96% 97% 100% 99%  Weight:      Height:        Start Time: 1111 hrs. End Time: 1124 hrs. Materials:  Needle(s) Type: Regular needle Gauge: 22G Length: 1.5-in Medication(s): Please see orders for medications and dosing details.  Imaging Guidance for procedure #2:  Type of Imaging Technique: Fluoroscopy Guidance (Spinal) Indication(s): Assistance in needle guidance and placement for procedures requiring needle  placement in or near specific anatomical locations not easily accessible without such assistance. Exposure Time: Please see nurses notes. Contrast: None used. Fluoroscopic Guidance: I was personally present during the use of fluoroscopy. "Tunnel Vision Technique" used to obtain the best possible view of the target area. Parallax error corrected before commencing the procedure. "Direction-depth-direction" technique used to introduce the needle under continuous pulsed fluoroscopy. Once target was reached, antero-posterior, oblique, and lateral fluoroscopic projection used confirm needle placement in all planes. Images permanently stored in EMR. Ultrasound Guidance: N/A     Interpretation: No contrast injected. I personally interpreted the imaging intraoperatively. Adequate needle placement confirmed in multiple planes. Permanent images saved into the patient's record.  Post-operative Assessment:  Post-procedure Vital Signs:  Pulse/HCG Rate: 61(!) 42 Temp: 98.2 F (36.8 C) Resp: 17 BP: 135/87 SpO2: 99 %  EBL: None  Complications: No immediate post-treatment complications observed by team, or reported by patient.  Note: The patient tolerated the entire procedure well. A repeat set of vitals were taken after the procedure and the patient was kept under observation following institutional policy, for this type of procedure. Post-procedural neurological assessment was performed, showing return to baseline, prior to discharge. The patient was provided with post-procedure discharge instructions, including a section on how to identify potential problems. Should any problems arise concerning this procedure, the patient was given instructions to immediately contact us, at any time, without hesitation. In any case, we plan to contact the patient by telephone for a follow-up status report regarding this interventional procedure.  Comments:  No additional relevant information.  Plan of Care   Imaging  Orders     DG C-Arm 1-60 Min-No Report  Procedure Orders     Thoracic Epidural Injection     Injection tendon or ligament  Medications ordered for procedure: Meds ordered this encounter  Medications  . lidocaine (XYLOCAINE) 2 % (with pres) injection 400 mg  . midazolam (VERSED) 5 MG/5ML injection 1-2 mg    Make sure Flumazenil is available in the pyxis when using this medication. If oversedation occurs, administer 0.2 mg IV over 15 sec. If after 45 sec no response, administer 0.2 mg again  over 1 min; may repeat at 1 min intervals; not to exceed 4 doses (1 mg)  . fentaNYL (SUBLIMAZE) injection 25-50 mcg    Make sure Narcan is available in the pyxis when using this medication. In the event of respiratory depression (RR< 8/min): Titrate NARCAN (naloxone) in increments of 0.1 to 0.2 mg IV at 2-3 minute intervals, until desired degree of reversal.  . lactated ringers infusion 1,000 mL  . DISCONTD: ropivacaine (PF) 2 mg/mL (0.2%) (NAROPIN) injection 9 mL  . triamcinolone acetonide (KENALOG-40) injection 40 mg  . iopamidol (ISOVUE-M) 41 % intrathecal injection 10 mL    Must be Myelogram-compatible. If not available, you may substitute with a water-soluble, non-ionic, hypoallergenic, myelogram-compatible radiological contrast medium.  . sodium chloride flush (NS) 0.9 % injection 2 mL  . ropivacaine (PF) 2 mg/mL (0.2%) (NAROPIN) injection 2 mL  . ropivacaine (PF) 2 mg/mL (0.2%) (NAROPIN) injection 9 mL  . methylPREDNISolone acetate (DEPO-MEDROL) injection 80 mg   Medications administered: We administered lidocaine, midazolam, fentaNYL, lactated ringers, triamcinolone acetonide, iopamidol, sodium chloride flush, ropivacaine (PF) 2 mg/mL (0.2%), ropivacaine (PF) 2 mg/mL (0.2%), and methylPREDNISolone acetate.  See the medical record for exact dosing, route, and time of administration.  Disposition: Discharge home  Discharge Date & Time: 08/29/2018; 1208 hrs.   Physician-requested Follow-up:  Return for post-procedure eval (2 wks), w/ Dr. Dossie Arbour.  Future Appointments  Date Time Provider [place]  09/05/2018  9:30 AM Gardiner Barefoot, DPM [facility]  09/16/2018 11:15 AM Milinda Pointer, MD [facility] None  10/22/2018 10:00 AM Harlin Heys, MD [facility] None  11/12/2018  1:45 PM Vevelyn Francois, NP [facility] None   Primary Care Physician: Leone Haven, MD Location: [facility] Note by: Gaspar Cola, MD Date: 08/29/2018; Time: 1:15 PM  Disclaimer:  Medicine is not an Chief Strategy Officer. The only guarantee in medicine is that nothing is guaranteed. It is important to note that the decision to proceed with this intervention was based on the information collected from the patient. The Data and conclusions were drawn from the patient's questionnaire, the interview, and the physical examination. Because the information was provided in large part by the patient, it cannot be guaranteed that it has not been purposely or unconsciously manipulated. Every effort has been made to obtain as much relevant data as possible for this evaluation. It is important to note that the conclusions that lead to this procedure are derived in large part from the available data. Always take into account that the treatment will also be dependent on availability of resources and existing treatment guidelines, considered by other Pain Management Practitioners as being common knowledge and practice, at the time of the intervention. For Medico-Legal purposes, it is also important to point out that variation in procedural techniques and pharmacological choices are the acceptable norm. The indications, contraindications, technique, and results of the above procedure should only be interpreted and judged by a Board-Certified Interventional Pain Specialist with extensive familiarity and expertise in the same exact procedure and technique.

## 2018-08-29 NOTE — Patient Instructions (Signed)

## 2018-08-29 NOTE — Telephone Encounter (Signed)
Sent to [facility] for approval since PCP is not in the office.   Thanks

## 2018-08-30 ENCOUNTER — Telehealth: Payer: Self-pay

## 2018-08-30 NOTE — Telephone Encounter (Signed)
Post procedure phone call.  Patient states she is hurting some but will put heat on it today and will call us for any worsening of symptoms.

## 2018-08-30 NOTE — Telephone Encounter (Signed)
Filled on 08/27/18

## 2018-08-30 NOTE — Telephone Encounter (Signed)
Was patient called to see if they have enough to last until Dr Caryl Bis is back? This should be done with all refill requests.

## 2018-09-02 ENCOUNTER — Telehealth: Payer: Self-pay | Admitting: Podiatry

## 2018-09-02 NOTE — Telephone Encounter (Signed)
I'm scheduled to see Dr. Prudence Davidson on Thursday. It says its for routine foot care but I wanted to make sure he's aware he's going to have to remove the toenails because I don't want to have to come back for another appointment. Also, I have peeling on the bottom of my heel I wanted him to look at as well. Please call me back at [phone].

## 2018-09-05 ENCOUNTER — Other Ambulatory Visit: Payer: Self-pay

## 2018-09-05 ENCOUNTER — Encounter: Payer: Self-pay | Admitting: Emergency Medicine

## 2018-09-05 ENCOUNTER — Ambulatory Visit: Payer: Self-pay

## 2018-09-05 ENCOUNTER — Other Ambulatory Visit: Payer: Self-pay | Admitting: Nurse Practitioner

## 2018-09-05 ENCOUNTER — Encounter: Payer: Self-pay | Admitting: Podiatry

## 2018-09-05 ENCOUNTER — Ambulatory Visit (INDEPENDENT_AMBULATORY_CARE_PROVIDER_SITE_OTHER): Payer: BLUE CROSS/BLUE SHIELD | Admitting: Podiatry

## 2018-09-05 ENCOUNTER — Emergency Department
Admission: EM | Admit: 2018-09-05 | Discharge: 2018-09-05 | Disposition: A | Discharge status: Home / Self Care | Payer: BLUE CROSS/BLUE SHIELD | Attending: Student in an Organized Health Care Education/Training Program | Admitting: Student in an Organized Health Care Education/Training Program

## 2018-09-05 ENCOUNTER — Emergency Department: Payer: BLUE CROSS/BLUE SHIELD

## 2018-09-05 DIAGNOSIS — Z79899 Other long term (current) drug therapy: Secondary | ICD-10-CM | POA: Diagnosis not present

## 2018-09-05 DIAGNOSIS — R079 Chest pain, unspecified: Secondary | ICD-10-CM | POA: Diagnosis not present

## 2018-09-05 DIAGNOSIS — F17290 Nicotine dependence, other tobacco product, uncomplicated: Secondary | ICD-10-CM | POA: Diagnosis not present

## 2018-09-05 DIAGNOSIS — R140 Abdominal distension (gaseous): Secondary | ICD-10-CM | POA: Insufficient documentation

## 2018-09-05 DIAGNOSIS — L309 Dermatitis, unspecified: Secondary | ICD-10-CM | POA: Diagnosis not present

## 2018-09-05 DIAGNOSIS — R1084 Generalized abdominal pain: Secondary | ICD-10-CM | POA: Diagnosis not present

## 2018-09-05 DIAGNOSIS — R0602 Shortness of breath: Secondary | ICD-10-CM | POA: Diagnosis present

## 2018-09-05 LAB — LIPASE, BLOOD: LIPASE: 44 U/L (ref 11–51)

## 2018-09-05 LAB — URINALYSIS, COMPLETE (UACMP) WITH MICROSCOPIC
BILIRUBIN URINE: NEGATIVE
GLUCOSE, UA: NEGATIVE mg/dL
HGB URINE DIPSTICK: NEGATIVE
KETONES UR: NEGATIVE mg/dL
Nitrite: NEGATIVE
PROTEIN: NEGATIVE mg/dL
Specific Gravity, Urine: 1.02 (ref 1.005–1.030)
pH: 5 (ref 5.0–8.0)

## 2018-09-05 LAB — COMPREHENSIVE METABOLIC PANEL
ALT: 23 U/L (ref 0–44)
ANION GAP: 10 (ref 5–15)
AST: 20 U/L (ref 15–41)
Albumin: 4.7 g/dL (ref 3.5–5.0)
Alkaline Phosphatase: 95 U/L (ref 38–126)
BILIRUBIN TOTAL: 0.3 mg/dL (ref 0.3–1.2)
BUN: 14 mg/dL (ref 6–20)
CHLORIDE: 104 mmol/L (ref 98–111)
CO2: 25 mmol/L (ref 22–32)
Calcium: 9.4 mg/dL (ref 8.9–10.3)
Creatinine, Ser: 0.75 mg/dL (ref 0.44–1.00)
Glucose, Bld: 158 mg/dL — ABNORMAL HIGH (ref 70–99)
POTASSIUM: 3.5 mmol/L (ref 3.5–5.1)
Sodium: 139 mmol/L (ref 135–145)
TOTAL PROTEIN: 7.9 g/dL (ref 6.5–8.1)

## 2018-09-05 LAB — CBC
HCT: 43.2 % (ref 36.0–46.0)
HEMOGLOBIN: 14.5 g/dL (ref 12.0–15.0)
MCH: 29.7 pg (ref 26.0–34.0)
MCHC: 33.6 g/dL (ref 30.0–36.0)
MCV: 88.3 fL (ref 80.0–100.0)
NRBC: 0 % (ref 0.0–0.2)
Platelets: 430 10*3/uL — ABNORMAL HIGH (ref 150–400)
RBC: 4.89 MIL/uL (ref 3.87–5.11)
RDW: 13.7 % (ref 11.5–15.5)
WBC: 13.2 10*3/uL — ABNORMAL HIGH (ref 4.0–10.5)

## 2018-09-05 LAB — TROPONIN I
Troponin I: <0.03 ng/mL (ref <0.03)
Troponin I: <0.03 ng/mL (ref <0.03)

## 2018-09-05 MED ORDER — IOPAMIDOL (ISOVUE-370) INJECTION 76%
100.0000 mL | Freq: Once | INTRAVENOUS | Status: AC | PRN
  Administered 2018-09-05: 100 mL via INTRAVENOUS

## 2018-09-05 MED ORDER — FLUOCINONIDE 0.05 % EX CREA: 1.0000 "application " | TOPICAL_CREAM | Freq: Two times a day (BID) | CUTANEOUS | 2 refills | Status: DC

## 2018-09-05 MED ORDER — SODIUM CHLORIDE 0.9 % IV BOLUS
1000.0000 mL | Freq: Once | INTRAVENOUS | Status: AC
  Administered 2018-09-05: 1000 mL via INTRAVENOUS

## 2018-09-05 NOTE — Discharge Instructions (Signed)
The tests that was done here today are negative.  Please follow-up with your regular doctor in the next day or 2.  Return here if you are worse.

## 2018-09-05 NOTE — Progress Notes (Signed)
.    This patient presents the office with chief complaint of a painful left heel.  She says she has developed a painful skin lesion that causes her to have soreness when she walks as well as dry scaly skin.  She says she was working on it herself last night and pulled and opened the skin lesion on the bottom of her left heel.  She does give a history of having stepped on a foreign body in the spring which may have started this skin lesion to develop.  She presents the office mainly for an evaluation of the skin lesion.  She also has a history of having nail surgery by myself and one nail spicules noted on the second toe of the left foot.  She presents the office today for an evaluation and treatment of her feet.  General Appearance  Alert, conversant and in no acute stress.  Vascular  Dorsalis pedis and posterior tibial  pulses are palpable  bilaterally.  Capillary return is within normal limits  bilaterally. Temperature is within normal limits  bilaterally. Here Neurologic  Senn-Weinstein monofilament wire test within normal limits  bilaterally. Muscle power within normal limits bilaterally.  Nails Thick disfigured discolored nails with subungual debris  from hallux to fifth toes bilaterally. No evidence of bacterial infection or drainage bilaterally.  Orthopedic  No limitations of motion  feet .  No crepitus or effusions noted.  No bony pathology or digital deformities noted.  Skin  normotropic skin with no porokeratosis noted bilaterally.  No signs of infections or ulcers noted.  Red tissue noted on the left heel in the absence of any swelling or infection.  There is an excoriation and a break in skin noted at the site site of her painful skin.  Nail spicule second left.  Dermatitis left heel.    ROV.  Prescribe lidex cream.  This skin lesion was palpated and no pain was elicited therefore based on her clinical findings and my exam I did not think an x-ray needs to be taken to determine if a  foreign body was necessary .  RTC prn.   Gardiner Barefoot DPM

## 2018-09-05 NOTE — ED Notes (Signed)
Sandwich tray provided along with a sprite

## 2018-09-05 NOTE — Telephone Encounter (Signed)
Pt c/o 3-4 day history of shortness of breath. Pt stated it feels like phlegm is caught and she cannot get it up. Pt stated it feels like her throat is closing up. Pt c/o feeling clammy and sweaty, dizziness and lightheaded. Pt stated that she is short of breath at rest. Pt was able to speak in full sentences. Care advice given and pt verbalized understanding. Pt decided she wanted to go to the ED. Pt will be going to [facility].  Reason for Disposition . [1] MILD difficulty breathing (e.g., minimal/no SOB at rest, SOB with walking, pulse <100) AND [2] NEW-onset or WORSE than normal  Additional Information . Commented on: [1] MODERATE difficulty breathing (e.g., speaks in phrases, SOB even at rest, pulse 100-120) AND [2] NEW-onset or WORSE than normal    Pt stated it feels like her throat is closing up. Pt was able to speak in full sentences  Answer Assessment - Initial Assessment Questions 1. RESPIRATORY STATUS: "Describe your breathing?" (e.g., wheezing, shortness of breath, unable to speak, severe coughing)      Shortness of breath feels like clogged in throat like a chunk of something 2. ONSET: "When did this breathing problem begin?"      3 days ago 3. PATTERN "Does the difficult breathing come and go, or has it been constant since it started?"      constnat and getting worse 4. SEVERITY: "How bad is your breathing?" (e.g., mild, moderate, severe)    - MILD: No SOB at rest, mild SOB with walking, speaks normally in sentences, can lay down, no retractions, pulse < 100.    - MODERATE: SOB at rest, SOB with minimal exertion and prefers to sit, cannot lie down flat, speaks in phrases, mild retractions, audible wheezing, pulse 100-120.    - SEVERE: Very SOB at rest, speaks in single words, struggling to breathe, sitting hunched forward, retractions, pulse > 120      moderate 5. RECURRENT SYMPTOM: "Have you had difficulty breathing before?" If so, ask: "When was the last time?" and "What happened that  time?"      no 6. CARDIAC HISTORY: "Do you have any history of heart disease?" (e.g., heart attack, angina, bypass surgery, angioplasty)      no 7. LUNG HISTORY: "Do you have any history of lung disease?"  (e.g., pulmonary embolus, asthma, emphysema)     no 8. CAUSE: "What do you think is causing the breathing problem?"      Phlegm and constipation 9. OTHER SYMPTOMS: "Do you have any other symptoms? (e.g., dizziness, runny nose, cough, chest pain, fever)     Dizziness and lightheaded, clammy and sweating, body ache 10. PREGNANCY: "Is there any chance you are pregnant?" "When was your last menstrual period?"       N/a-no 11. TRAVEL: "Have you traveled out of the country in the last month?" (e.g., travel history, exposures)       no  Protocols used: BREATHING DIFFICULTY-A-AH

## 2018-09-05 NOTE — ED Notes (Signed)
Pt given Ginger Ale to drink per request.

## 2018-09-05 NOTE — ED Provider Notes (Signed)
[facility] Emergency Department Provider Note    First MD Initiated Contact with Patient 09/05/18 1401     (approximate)  I have reviewed the triage vital signs and the nursing notes.   HISTORY  Chief Complaint Shortness of Breath and Abdominal Pain    HPI Nicole Walter is a 57 y.o. female with extensive past medical history as listed below presents the ER for multiple complaints of the past several days including shortness of breath feeling abdominal bloating decreased bowel movements and concern for obstruction.  Also having some chest pain shortness of breath when she takes a deep breath.  Denies any chest pain at this time.  Was directed to the ER by primary care provider for CT imaging recorded patient.  Does have arrived mildly tachycardic but does not appear in any extremitas but does have some discomfort.  Is not tried anything at home to remedy the pain.  Past Medical History:  Diagnosis Date  . Abnormal liver enzymes 02/08/2016  . Allergy   . Anxiety   . Chest pain 07/05/2016  . Chronic pain   . Depression   . DJD (degenerative joint disease)    lumbar  . Fibromyalgia   . High cholesterol   . Rash of back 01/28/2016  . Weight loss due to medication    Family History  Problem Relation Age of Onset  . Heart disease Mother   . Stroke Mother   . Cancer Father        lung  . Arthritis Father   . Arthritis Paternal Grandmother   . Arthritis Paternal Grandfather   . Drug abuse Sister   . Drug abuse Brother   . Post-traumatic stress disorder Brother   . Diabetes Sister   . Dementia Sister   . Drug abuse Brother   . Cancer Brother    Past Surgical History:  Procedure Laterality Date  . ANKLE SURGERY     x 2  . BREAST BIOPSY Left 1997  . CHOLECYSTECTOMY    . COLONOSCOPY    . COLONOSCOPY WITH PROPOFOL N/A 02/22/2016   Procedure: COLONOSCOPY WITH PROPOFOL;  Surgeon: Lollie Sails, MD;  Location: [facility] ENDOSCOPY;  Service: Endoscopy;   Laterality: N/A;  . ESOPHAGOGASTRODUODENOSCOPY (EGD) WITH PROPOFOL N/A 02/22/2016   Procedure: ESOPHAGOGASTRODUODENOSCOPY (EGD) WITH PROPOFOL;  Surgeon: Lollie Sails, MD;  Location: [facility] ENDOSCOPY;  Service: Endoscopy;  Laterality: N/A;  . NASAL SINUS SURGERY    . TONSILLECTOMY    . TUBAL LIGATION     Patient Active Problem List   Diagnosis Date Noted  . Spondylosis without myelopathy or radiculopathy, lumbar region 08/29/2018  . Lumbar facet hypertrophy 08/29/2018  . Lumbar facet syndrome (Right) 08/29/2018  . DDD (degenerative disc disease), thoracic 08/29/2018  . Thoracic spondylosis with radiculopathy (Right) 08/29/2018  . Spinal enthesopathy of lumbar region ([place]) 08/29/2018  . Chronic midline low back pain without sciatica 08/29/2018  . Generalized anxiety disorder 07/23/2018  . Cervical facet hypertrophy (Bilateral) 07/18/2018  . Cervical central spinal stenosis 07/18/2018  . Chronic shoulder pain (Left) 07/09/2018  . Cervicalgia 03/28/2018  . Laceration of hand 02/12/2018  . Spondylosis without myelopathy or radiculopathy, cervical region 02/11/2018  . Chronic pelvic pain in female 08/24/2017  . Fatty liver 08/24/2017  . Chronic low back pain (Bilateral) (L>R) 08/13/2017  . Cervical Foraminal Stenosis (Severe) (Bilateral: C-5-6) 08/13/2017  . Cervical spondylosis with radiculopathy (Bilateral) (L>R) 08/13/2017  . Cervical sensory radiculopathy at C5 (Bilateral) 08/13/2017  . Radicular  pain of shoulder (Bilateral) (C5) 08/13/2017  . Constipation 07/16/2017  . Myofascial pain syndrome, cervical (trapezius) (Left) 05/28/2017  . Cervical facet syndrome (Bilateral) (L>R) 05/28/2017  . Muscle spasm 05/15/2017  . Chronic hip pain (Right) 03/19/2017  . Chronic knee arthropathy (Right) 03/14/2017  . Osteoarthritis of knee (Right) 02/21/2017  . Osteoarthritis of shoulder (Right) 01/31/2017  . Shortness of breath 01/15/2017  . Thoracic spondylosis 01/01/2017  . Thoracic  radiculitis (R>L) 12/21/2016  . Osteoarthritis of hip (Bilateral) (L>R) 11/20/2016  . Greater trochanteric bursitis (Right) 09/18/2016  . Subacromial bursitis of shoulder joint (Right) 09/18/2016  . Overweight (BMI 25.0-29.9) 09/14/2016  . Elevated sedimentation rate 08/31/2016  . Elevated C-reactive protein (CRP) 08/31/2016  . Long term current use of opiate analgesic 07/20/2016  . Long term prescription opiate use 07/20/2016  . Opiate use 07/20/2016  . Chronic upper back pain (Secondary Area of Pain) (Bilateral) (L>R) 07/20/2016  . Chronic shoulder pain [facility] source of pain) (Bilateral) (L>R) 07/20/2016  . Chronic knee pain (Right) 07/20/2016  . Chronic upper extremity pain (Left) 07/20/2016  . Chronic lower extremity pain (Bilateral) (L>R) 07/20/2016  . Chronic abdominal pain 07/20/2016  . Chronic hip pain (Bilateral) (L>R) 05/31/2016  . Chronic Greater trochanteric bursitis (Bilateral) (L>R) 05/31/2016  . Bursitis of shoulder (Right) 05/17/2016  . Skin lesions 04/27/2016  . Hyperlipidemia 02/18/2016  . Cervical spondylosis (Bilateral) 02/08/2016  . Fibromyalgia 02/08/2016  . Osteopenia 02/08/2016  . Chronic neck pain (Primary Source of Pain) (Bilateral) (L>R) 01/28/2016  . Concentration deficit 01/28/2016  . Encounter for general adult medical examination with abnormal findings 01/14/2016  . Lumbar spondylosis 01/06/2016  . Anxiety and depression 07/11/2015  . Chronic pain syndrome 07/11/2015  . Combined fat and carbohydrate induced hyperlipemia 07/11/2015      Prior to Admission medications   Medication Sig Start Date End Date Taking? Authorizing Provider  dicyclomine (BENTYL) 10 MG capsule Take by mouth. 08/26/18 08/26/19  [provider]  fluocinonide cream (LIDEX) 1.61 % Apply 1 application topically 2 (two) times daily. 09/05/18   Gardiner Barefoot, DPM  fluocinonide ointment (LIDEX) 0.05 % APPLY TOPICALLY TWICE A DAY TO ITCHY AREAS ONLY. as needed 05/08/16    [provider]  fluticasone Asencion Islam) 50 MCG/ACT nasal spray instill 2 sprays into each nostril once daily 03/07/18   Leone Haven, MD  gabapentin (NEURONTIN) 300 MG capsule Take 1-3 capsules (300-900 mg total) by mouth 4 (four) times daily. Follow the written titration instructions. Patient not taking: Reported on 08/29/2018 08/19/18 09/18/18  Vevelyn Francois, NP  ibuprofen (ADVIL,MOTRIN) 800 MG tablet Take 1 tablet (800 mg total) by mouth every 8 (eight) hours as needed. 05/20/18   Vevelyn Francois, NP  linaclotide (LINZESS) 145 MCG CAPS capsule Take 145 mcg by mouth daily before breakfast.    [provider]  LINZESS 72 MCG capsule TK 1 C PO QD. TAKE 30 MINUTES BEFORE BREAKFAST 08/27/18   [provider]  LORazepam (ATIVAN) 1 MG tablet take 1 tablet by mouth twice a day if needed for anxiety DO NO DRINK ALCOHOL WHILE TAKING THIS MEDICATION 08/27/18   Leone Haven, MD  Multiple Vitamin (MULTI-VITAMINS) TABS Take 1 tablet by mouth daily.     [provider]  naloxone [facility]) nasal spray 4 mg/0.1 mL Spray into one nostril. Repeat with second device into other nostril after 2-3 minutes if no or minimal response. Use in case of opioid overdose. 05/20/18   Vevelyn Francois, NP  Omega-3  Fatty Acids (FISH OIL) 1200 MG CAPS Take by mouth.    [provider]  oxyCODONE (OXY IR/ROXICODONE) 5 MG immediate release tablet Take 1 tablet (5 mg total) by mouth every 6 (six) hours as needed for severe pain. 11/04/18 12/04/18  Vevelyn Francois, NP  oxyCODONE (OXY IR/ROXICODONE) 5 MG immediate release tablet Take 1 tablet (5 mg total) by mouth every 6 (six) hours as needed for severe pain. 10/05/18 11/04/18  Vevelyn Francois, NP  oxyCODONE (OXY IR/ROXICODONE) 5 MG immediate release tablet Take 1 tablet (5 mg total) by mouth every 6 (six) hours as needed for severe pain. 09/05/18 10/05/18  Vevelyn Francois, NP  RABEprazole (ACIPHEX) 20 MG tablet Take 20 mg by mouth as  needed.  06/22/16   [provider]  sucralfate (CARAFATE) 1 g tablet take 1 tablet by mouth three times a day if needed for abdominal pain 06/22/16   [provider]  TRINTELLIX 5 MG TABS tablet take 3 tablets by mouth once daily 06/05/18   Leone Haven, MD    Allergies Other; Food; and Pea extract    Social History Social History   Tobacco Use  . Smoking status: Former Smoker    Packs/day: 1.00    Years: 35.00    Pack years: 35.00    Start date: 03/06/1974    Last attempt to quit: 07/08/2015    Years since quitting: 3.1  . Smokeless tobacco: Current User  . Tobacco comment: vapor cigarettes, no nicotene  Substance Use Topics  . Alcohol use: No    Alcohol/week: 0.0 standard drinks  . Drug use: No    Review of Systems Patient denies headaches, rhinorrhea, blurry vision, numbness, shortness of breath, chest pain, edema, cough, abdominal pain, nausea, vomiting, diarrhea, dysuria, fevers, rashes or hallucinations unless otherwise stated above in HPI. ____________________________________________   PHYSICAL EXAM:  VITAL SIGNS: Vitals:   09/05/18 1356  BP: (!) 130/105  Pulse: (!) 105  Resp: 18  Temp: 97.9 F (36.6 C)  SpO2: 97%    Constitutional: Alert and oriented.  Eyes: Conjunctivae are normal.  Head: Atraumatic. Nose: No congestion/rhinnorhea. Mouth/Throat: Mucous membranes are moist.   Neck: No stridor. Painless ROM.  Cardiovascular: Normal rate, regular rhythm. Grossly normal heart sounds.  Good peripheral circulation. Respiratory: Normal respiratory effort.  No retractions. Lungs CTAB. Gastrointestinal: Soft with mild midepigastric ttp. No distention. No abdominal bruits. No CVA tenderness. Genitourinary:  Musculoskeletal: No lower extremity tenderness nor edema.  No joint effusions. Neurologic:  Normal speech and language. No gross focal neurologic deficits are appreciated. No facial droop Skin:  Skin is warm, dry and intact. No rash  noted. Psychiatric: Mood and affect are normal. Speech and behavior are normal.  ____________________________________________   LABS (all labs ordered are listed, but only abnormal results are displayed)  Results for orders placed or performed during the hospital encounter of 09/05/18 (from the past 24 hour(s))  Lipase, blood     Status: None   Collection Time: 09/05/18  2:08 PM  Result Value Ref Range   Lipase 44 11 - 51 U/L  Comprehensive metabolic panel     Status: Abnormal   Collection Time: 09/05/18  2:08 PM  Result Value Ref Range   Sodium 139 135 - 145 mmol/L   Potassium 3.5 3.5 - 5.1 mmol/L   Chloride 104 98 - 111 mmol/L   CO2 25 22 - 32 mmol/L   Glucose, Bld 158 (H) 70 - 99 mg/dL   BUN  14 6 - 20 mg/dL   Creatinine, Ser 0.75 0.44 - 1.00 mg/dL   Calcium 9.4 8.9 - 10.3 mg/dL   Total Protein 7.9 6.5 - 8.1 g/dL   Albumin 4.7 3.5 - 5.0 g/dL   AST 20 15 - 41 U/L   ALT 23 0 - 44 U/L   Alkaline Phosphatase 95 38 - 126 U/L   Total Bilirubin 0.3 0.3 - 1.2 mg/dL   GFR calc non Af Amer >60 >60 mL/min   GFR calc Af Amer >60 >60 mL/min   Anion gap 10 5 - 15  CBC     Status: Abnormal   Collection Time: 09/05/18  2:08 PM  Result Value Ref Range   WBC 13.2 (H) 4.0 - 10.5 K/uL   RBC 4.89 3.87 - 5.11 MIL/uL   Hemoglobin 14.5 12.0 - 15.0 g/dL   HCT 43.2 36.0 - 46.0 %   MCV 88.3 80.0 - 100.0 fL   MCH 29.7 26.0 - 34.0 pg   MCHC 33.6 30.0 - 36.0 g/dL   RDW 13.7 11.5 - 15.5 %   Platelets 430 (H) 150 - 400 K/uL   nRBC 0.0 0.0 - 0.2 %  Urinalysis, Complete w Microscopic     Status: Abnormal   Collection Time: 09/05/18  2:08 PM  Result Value Ref Range   Color, Urine YELLOW (A) YELLOW   APPearance CLEAR (A) CLEAR   Specific Gravity, Urine 1.020 1.005 - 1.030   pH 5.0 5.0 - 8.0   Glucose, UA NEGATIVE NEGATIVE mg/dL   Hgb urine dipstick NEGATIVE NEGATIVE   Bilirubin Urine NEGATIVE NEGATIVE   Ketones, ur NEGATIVE NEGATIVE mg/dL   Protein, ur NEGATIVE NEGATIVE mg/dL   Nitrite  NEGATIVE NEGATIVE   Leukocytes, UA LARGE (A) NEGATIVE   RBC / HPF 0-5 0 - 5 RBC/hpf   WBC, UA 6-10 0 - 5 WBC/hpf   Bacteria, UA RARE (A) NONE SEEN   Squamous Epithelial / LPF 0-5 0 - 5   Mucus PRESENT    ____________________________________________  EKG My review and personal interpretation at Time: 14:07   Indication: chest pain  Rate: 90  Rhythm: sinus Axis: normal Other: normal intervals, no stemi, nonspecific st abn ____________________________________________  RADIOLOGY  I personally reviewed all radiographic images ordered to evaluate for the above acute complaints and reviewed radiology reports and findings.  These findings were personally discussed with the patient.  Please see medical record for radiology report.  ____________________________________________   PROCEDURES  Procedure(s) performed:  Procedures    Critical Care performed: no ____________________________________________   INITIAL IMPRESSION / ASSESSMENT AND PLAN / ED COURSE  Pertinent labs & imaging results that were available during my care of the patient were reviewed by me and considered in my medical decision making (see chart for details).   DDX: SBO, enteritis, colitis, diverticulitis, PE, pneumonia, ascites, malignancy, ACS  Niara Bunker is a 58 y.o. who presents to the ED with symptoms as described above.  Patient nontoxic-appearing but is mildly tachycardic.  Given her multiple complaints will order CT imaging of the chest and abdomen to evaluate for the above differential.  EKG shows some nonspecific changes.  Does not seem clinically consistent with ACS but will order serial enzymes to further stratify.  Will provide pain medication and IV fluids.  Patient be signed out to oncoming physician pending follow-up CT as well as repeat enzyme.  If those are normal anticipate discharge home.      As part of my medical decision  making, I reviewed the following data within the electronic medical  record:  Nursing notes reviewed and incorporated, Labs reviewed, notes from prior ED visits.   ____________________________________________   FINAL CLINICAL IMPRESSION(S) / ED DIAGNOSES  Final diagnoses:  Generalized abdominal pain      NEW MEDICATIONS STARTED DURING THIS VISIT:  New Prescriptions   No medications on file     Note:  This document was prepared using Dragon voice recognition software and may include unintentional dictation errors.    Merlyn Lot, MD 09/05/18 [phone]

## 2018-09-05 NOTE — ED Triage Notes (Signed)
Patient reports shortness of breath and feeling of foreign body stuck in throat x2 days. Reports she has been eating and drinking without difficulty. Patient speaking in complete sentences with even, unlabored breaths in triage. Patient also complaining of bloating in abdomen and decreased bowel movements x3 days.

## 2018-09-05 NOTE — Addendum Note (Signed)
Addended by: Graceann Congress D on: 09/05/2018 10:26 AM   Modules accepted: Orders

## 2018-09-05 NOTE — Telephone Encounter (Signed)
Called patients home phone, family member stated she was going over to the hospital. Do not see where she has arrived at the ED. Family member stated that she should be there by now. Called patients cell phone and left message for her to call back

## 2018-09-06 NOTE — Telephone Encounter (Signed)
Pt did go to ED yesterday.

## 2018-09-09 ENCOUNTER — Ambulatory Visit: Payer: BLUE CROSS/BLUE SHIELD | Admitting: Psychology

## 2018-09-10 ENCOUNTER — Other Ambulatory Visit: Payer: Self-pay | Admitting: Family Medicine

## 2018-09-10 DIAGNOSIS — Z1231 Encounter for screening mammogram for malignant neoplasm of breast: Secondary | ICD-10-CM

## 2018-09-15 NOTE — Progress Notes (Signed)
Patient's Name: Nicole Walter  MRN: [id number]  Referring Provider: Leone Haven, MD  DOB: 07-Feb-1961  PCP: Leone Haven, MD  DOS: 09/16/2018  Note by: Gaspar Cola, MD  Service setting: Ambulatory outpatient  Specialty: Interventional Pain Management  Location: [facility] (AMB) Pain Management Facility    Patient type: Established   Primary Reason(s) for Visit: Encounter for post-procedure evaluation of chronic illness with mild to moderate exacerbation CC: Joint Pain (general joint pain all over) and Neck Pain  HPI  Nicole Walter is a 57 y.o. year old, female patient, who comes today for a post-procedure evaluation. She has Anxiety and depression; Chronic pain syndrome; Lumbar spondylosis; Combined fat and carbohydrate induced hyperlipemia; Encounter for general adult medical examination with abnormal findings; Chronic neck pain (Primary Area of Pain) (Bilateral) (L>R); Concentration deficit; Hyperlipidemia; Cervical spondylosis (Bilateral); Fibromyalgia; Osteopenia; Skin lesions; Chronic hip pain (Bilateral) (R>L); Bursitis of shoulder (Right); Chronic Greater trochanteric bursitis (Bilateral) (L>R); Long term current use of opiate analgesic; Long term prescription opiate use; Opiate use; Chronic upper back pain (Secondary Area of Pain) (Bilateral) (L>R); Chronic shoulder pain (Tertiary Area of Pain) (Bilateral) (L>R); Chronic knee pain (Right); Chronic upper extremity pain (Left); Chronic lower extremity pain (Bilateral) (L>R); Chronic abdominal pain; Elevated sedimentation rate; Elevated C-reactive protein (CRP); Overweight (BMI 25.0-29.9); Greater trochanteric bursitis (Right); Subacromial bursitis of shoulder joint (Right); Osteoarthritis of hip (Bilateral); Thoracic radiculitis (R>L); Thoracic spondylosis; Shortness of breath; Osteoarthritis of shoulder (Right); Osteoarthritis of knee (Right); Chronic knee arthropathy (Right); Chronic hip pain (Right); Muscle spasm; Myofascial pain  syndrome, cervical (trapezius) (Left); Cervical facet syndrome (Bilateral) (L>R); Constipation; Chronic low back pain (Bilateral) (L>R); Cervical Foraminal Stenosis (Severe) (Bilateral: C-5-6); Cervical spondylosis with radiculopathy (Bilateral) (L>R); Cervical sensory radiculopathy at C5 (Bilateral); Radicular pain of shoulder (Bilateral) (C5); Chronic pelvic pain in female; Fatty liver; Spondylosis without myelopathy or radiculopathy, cervical region; Laceration of hand; Cervicalgia; Chronic shoulder pain (Left); Cervical facet hypertrophy (Bilateral); Cervical central spinal stenosis; Generalized anxiety disorder; Spondylosis without myelopathy or radiculopathy, lumbar region; Lumbar facet hypertrophy; Lumbar facet syndrome (Right); DDD (degenerative disc disease), thoracic; Thoracic spondylosis with radiculopathy (Right); Spinal enthesopathy of lumbar region [facility]); and Chronic midline low back pain without sciatica on their problem list. Her primarily concern today is the Joint Pain (general joint pain all over) and Neck Pain  Pain Assessment: Location:   Neck Onset: More than a month ago Duration: Chronic pain Quality: Aching, Constant(pulling) Severity: 1 /10 (subjective, self-reported pain score)  Note: Reported level is compatible with observation.                         When using our objective Pain Scale, levels between 6 and 10/10 are said to belong in an emergency room, as it progressively worsens from a 6/10, described as severely limiting, requiring emergency care not usually available at an outpatient pain management facility. At a 6/10 level, communication becomes difficult and requires great effort. Assistance to reach the emergency department may be required. Facial flushing and profuse sweating along with potentially dangerous increases in heart rate and blood pressure will be evident. Timing: Constant Modifying factors: heat, ice, medication BP: 120/79  HR: 97  Nicole Walter comes in  today for post-procedure evaluation.  The patient returns to the clinic today indicating that she attained 100% relief of her upper back pain and thoracic radiculitis.  In addition she also obtained 100% relief of the pain she had in the midline.  However,  she is having pain in the right hip area and she is also having pain in the neck area.  In the cervical spine, she was diagnosed to have a bilateral cervical facet syndrome and we have done that diagnostic cervical facet blocks and the radiofrequency on the left side.  However, we have not done radiofrequency on the right side because the patient wanted to wait.  Apparently she had a flareup of the left side pain after the radiofrequency and she was a little hesitant about it.  Today we spoke about that and the fact that it is normal that he can have more pain than usual for up to 6 weeks after a radiofrequency and since her left arm symptoms and left neck pain improved significantly after that radiofrequency, she now wants to proceed with the radiofrequency on the right side.  Because it will take some time to get this preapproved, and she is currently having some pain in the right hip area, she would like to have the right hip injection done first.  During today's physical exam she did test positive for cervical facet disease as well as a right hip arthralgia.  Further details on both, my assessment(s), as well as the proposed treatment plan, please see below.  Post-Procedure Assessment  08/29/2018 Procedure: Diagnostic right-sided T9-10 thoracic ESI #1 + diagnostic (Midline) L2-3 interlaminar ligament injection #1 under fluoroscopic guidance and IV sedation Pre-procedure pain score:  2/10 Post-procedure pain score: 0/10 (100% relief) Influential Factors: BMI: 26.78 kg/m Intra-procedural challenges: None observed.         Assessment challenges: None detected.              Reported side-effects: None.        Post-procedural adverse reactions or  complications: None reported         Sedation: Sedation provided. When no sedatives are used, the analgesic levels obtained are directly associated to the effectiveness of the local anesthetics. However, when sedation is provided, the level of analgesia obtained during the initial 1 hour following the intervention, is believed to be the result of a combination of factors. These factors may include, but are not limited to: 1. The effectiveness of the local anesthetics used. 2. The effects of the analgesic(s) and/or anxiolytic(s) used. 3. The degree of discomfort experienced by the patient at the time of the procedure. 4. The patients ability and reliability in recalling and recording the events. 5. The presence and influence of possible secondary gains and/or psychosocial factors. Reported result: Relief experienced during the 1st hour after the procedure: 50 % (Ultra-Short Term Relief)            Interpretative annotation: Clinically appropriate result. Analgesia during this period is likely to be Local Anesthetic and/or IV Sedative (Analgesic/Anxiolytic) related.          Effects of local anesthetic: The analgesic effects attained during this period are directly associated to the localized infiltration of local anesthetics and therefore cary significant diagnostic value as to the etiological location, or anatomical origin, of the pain. Expected duration of relief is directly dependent on the pharmacodynamics of the local anesthetic used. Long-acting (4-6 hours) anesthetics used.  Reported result: Relief during the next 4 to 6 hour after the procedure: 50 % (Short-Term Relief)            Interpretative annotation: Clinically appropriate result. Analgesia during this period is likely to be Local Anesthetic-related.          Long-term benefit: Defined as  the period of time past the expected duration of local anesthetics (1 hour for short-acting and 4-6 hours for long-acting). With the possible exception  of prolonged sympathetic blockade from the local anesthetics, benefits during this period are typically attributed to, or associated with, other factors such as analgesic sensory neuropraxia, antiinflammatory effects, or beneficial biochemical changes provided by agents other than the local anesthetics.  Reported result: Extended relief following procedure: 100 % (Long-Term Relief)            Interpretative annotation: Clinically possible results. Good relief. No permanent benefit expected. Inflammation plays a part in the etiology to the pain.          Current benefits: Defined as reported results that persistent at this point in time.   Analgesia: 100 % Nicole Walter reports improvement of axial symptoms. Function: Nicole Walter reports improvement in function ROM: Nicole Walter reports improvement in ROM Interpretative annotation: Complete relief. Therapeutic success. Effective therapeutic approach.          Interpretation: Results would suggest a successful diagnostic intervention.                  Plan:  The patient has pain on the percent relief of the thoracic radiculitis in the midline low back pain.  However, she is continued to have pain in the right side of her neck.  She has a history of a bilateral cervical facet syndrome and we have only done radiofrequency on the left side.  At this point she is requesting that we complete the radiofrequency on the right side.       "The patient has failed to respond to conservative therapies including over-the-counter medications, anti-inflammatories, muscle relaxants, membrane stabilizers, opioids, physical therapy modalities such as heat and ice, as well as more invasive techniques such as nerve blocks. Because Nicole Walter did attain more than 50% relief of the pain during a series of diagnostic blocks conducted in separate occasions, I believe it is medically necessary to proceed with Radiofrequency Ablation, in order to attempt gaining longer relief.   Medical  Necessity: Nicole Walter has been dealing with the above chronic pain from the Spondylosis without myelopathy or radiculopathy, cervical region [M47.812] for longer than three months and has either failed to respond, was unable to tolerate, or simply did not get enough benefit from other more conservative therapies including, but not limited to: 1. Over-the-counter medications 2. Anti-inflammatory medications 3. Muscle relaxants 4. Membrane stabilizers 5. Opioids 6. Physical therapy 7. Modalities (Heat, ice, etc.) 8. Invasive techniques such as nerve blocks. Nicole Walter has attained more than 50% relief of the pain from a series of diagnostic injections conducted in separate occasions. For this reason, I believe it is medically necessary to proceed with Radiofrequency Ablation for the purpose of attempting to prolong the duration of the benefits seen with the diagnostic injections.   Laboratory Chemistry  Inflammation Markers (CRP: Acute Phase) (ESR: Chronic Phase) Lab Results  Component Value Date   CRP 2.1 (H) 07/20/2016   ESRSEDRATE 2 05/15/2017                         Rheumatology Markers Lab Results  Component Value Date   RF <10.0 08/31/2016                        Renal Markers Lab Results  Component Value Date   BUN 14 09/05/2018   CREATININE 0.75 09/05/2018  GFRAA >60 09/05/2018   GFRNONAA >60 09/05/2018                             Hepatic Markers Lab Results  Component Value Date   AST 20 09/05/2018   ALT 23 09/05/2018   ALBUMIN 4.7 09/05/2018   HCVAB NEGATIVE 02/14/2017                        Neuropathy Markers Lab Results  Component Value Date   VITAMINB12 987 (H) 07/20/2016   HGBA1C 5.3 10/24/2017                        Hematology Parameters Lab Results  Component Value Date   PLT 430 (H) 09/05/2018   HGB 14.5 09/05/2018   HCT 43.2 09/05/2018                        CV Markers Lab Results  Component Value Date   TROPONINI <0.03 09/05/2018                          Note: Lab results reviewed.  Recent Imaging Results   Results for orders placed in visit on 08/29/18  DG C-Arm 1-60 Min-No Report   Narrative Fluoroscopy was utilized by the requesting physician.  No radiographic  interpretation.    Interpretation Report: Fluoroscopy was used during the procedure to assist with needle guidance. The images were interpreted intraoperatively by the requesting physician.  Meds   Current Outpatient Medications:  .  dicyclomine (BENTYL) 10 MG capsule, Take by mouth., Disp: , Rfl:  .  fluocinonide cream (LIDEX) 7.40 %, Apply 1 application topically 2 (two) times daily., Disp: 30 g, Rfl: 2 .  fluocinonide ointment (LIDEX) 0.05 %, APPLY TOPICALLY TWICE A DAY TO ITCHY AREAS ONLY. as needed, Disp: , Rfl: 0 .  fluticasone (FLONASE) 50 MCG/ACT nasal spray, instill 2 sprays into each nostril once daily, Disp: 16 g, Rfl: 11 .  gabapentin (NEURONTIN) 300 MG capsule, Take 1-3 capsules (300-900 mg total) by mouth 4 (four) times daily. Follow the written titration instructions. (Patient taking differently: Take 600 mg by mouth at bedtime. Follow the written titration instructions.), Disp: 360 capsule, Rfl: 0 .  ibuprofen (ADVIL,MOTRIN) 800 MG tablet, Take 1 tablet (800 mg total) by mouth every 8 (eight) hours as needed., Disp: 30 tablet, Rfl: 0 .  linaclotide (LINZESS) 145 MCG CAPS capsule, Take 145 mcg by mouth daily before breakfast., Disp: , Rfl:  .  LINZESS 72 MCG capsule, TK 1 C PO QD. TAKE 30 MINUTES BEFORE BREAKFAST, Disp: , Rfl: 3 .  LORazepam (ATIVAN) 1 MG tablet, take 1 tablet by mouth twice a day if needed for anxiety DO NO DRINK ALCOHOL WHILE TAKING THIS MEDICATION, Disp: 60 tablet, Rfl: 1 .  Multiple Vitamin (MULTI-VITAMINS) TABS, Take 1 tablet by mouth daily. , Disp: , Rfl:  .  naloxone (NARCAN) nasal spray 4 mg/0.1 mL, Spray into one nostril. Repeat with second device into other nostril after 2-3 minutes if no or minimal response. Use in case of  opioid overdose., Disp: 1 kit, Rfl: 0 .  Omega-3 Fatty Acids (FISH OIL) 1200 MG CAPS, Take by mouth., Disp: , Rfl:  .  [START ON 11/04/2018] oxyCODONE (OXY IR/ROXICODONE) 5 MG immediate release tablet, Take 1 tablet (5 mg total) by mouth  every 6 (six) hours as needed for severe pain., Disp: 120 tablet, Rfl: 0 .  [START ON 10/05/2018] oxyCODONE (OXY IR/ROXICODONE) 5 MG immediate release tablet, Take 1 tablet (5 mg total) by mouth every 6 (six) hours as needed for severe pain., Disp: 120 tablet, Rfl: 0 .  oxyCODONE (OXY IR/ROXICODONE) 5 MG immediate release tablet, Take 1 tablet (5 mg total) by mouth every 6 (six) hours as needed for severe pain., Disp: 120 tablet, Rfl: 0 .  RABEprazole (ACIPHEX) 20 MG tablet, Take 20 mg by mouth as needed. , Disp: , Rfl:  .  sucralfate (CARAFATE) 1 g tablet, Take by mouth daily. , Disp: , Rfl: 0 .  TRINTELLIX 5 MG TABS tablet, take 3 tablets by mouth once daily, Disp: 90 tablet, Rfl: 3  ROS  Constitutional: Denies any fever or chills Gastrointestinal: No reported hemesis, hematochezia, vomiting, or acute GI distress Musculoskeletal: Denies any acute onset joint swelling, redness, loss of ROM, or weakness Neurological: No reported episodes of acute onset apraxia, aphasia, dysarthria, agnosia, amnesia, paralysis, loss of coordination, or loss of consciousness  Allergies  Nicole Walter is allergic to other; food; and pea.  PFSH  Drug: Nicole Walter  reports that she does not use drugs. Alcohol:  reports that she does not drink alcohol. Tobacco:  reports that she quit smoking about 3 years ago. She started smoking about 44 years ago. She has a 35.00 pack-year smoking history. She uses smokeless tobacco. Medical:  has a past medical history of Abnormal liver enzymes (02/08/2016), Allergy, Anxiety, Chest pain (07/05/2016), Chronic pain, Depression, DJD (degenerative joint disease), Fibromyalgia, High cholesterol, Rash of back (01/28/2016), and Weight loss due to  medication. Surgical: Nicole Walter  has a past surgical history that includes Cholecystectomy; Tubal ligation; Ankle surgery; Nasal sinus surgery; Tonsillectomy; Colonoscopy; Esophagogastroduodenoscopy (egd) with propofol (N/A, 02/22/2016); Colonoscopy with propofol (N/A, 02/22/2016); and Breast biopsy (Left, 1997). Family: family history includes Arthritis in her father, paternal grandfather, and paternal grandmother; Cancer in her brother and father; Dementia in her sister; Diabetes in her sister; Drug abuse in her brother, brother, and sister; Heart disease in her mother; Post-traumatic stress disorder in her brother; Stroke in her mother.  Constitutional Exam  General appearance: Well nourished, well developed, and well hydrated. In no apparent acute distress Vitals:   09/16/18 1115  BP: 120/79  Pulse: 97  Resp: 18  Temp: 98.2 F (36.8 C)  TempSrc: Oral  SpO2: 98%  Weight: 156 lb (70.8 kg)  Height: _0  (1.626 m)   BMI Assessment: Estimated body mass index is 26.78 kg/m as calculated from the following:   Height as of this encounter: _1  (1.626 m).   Weight as of this encounter: 156 lb (70.8 kg).  BMI interpretation table: BMI level Category Range association with higher incidence of chronic pain  <18 kg/m2 Underweight   18.5-24.9 kg/m2 Ideal body weight   25-29.9 kg/m2 Overweight Increased incidence by 20%  30-34.9 kg/m2 Obese (Class I) Increased incidence by 68%  35-39.9 kg/m2 Severe obesity (Class II) Increased incidence by 136%  >40 kg/m2 Extreme obesity (Class III) Increased incidence by 254%   Patient's current BMI Ideal Body weight  Body mass index is 26.78 kg/m. Ideal body weight: 54.7 kg (120 lb 9.5 oz) Adjusted ideal body weight: 61.1 kg (134 lb 12.1 oz)   BMI Readings from Last 4 Encounters:  09/16/18 26.78 kg/m  09/05/18 27.46 kg/m  08/29/18 26.78 kg/m  08/19/18 26.95 kg/m   Wt Readings from  Last 4 Encounters:  09/16/18 156 lb (70.8 kg)  09/05/18 160 lb  (72.6 kg)  08/29/18 156 lb (70.8 kg)  08/19/18 157 lb (71.2 kg)  Psych/Mental status: Alert, oriented x 3 (Walter, place, & time)       Eyes: PERLA Respiratory: No evidence of acute respiratory distress  Cervical Spine Area Exam  Skin & Axial Inspection: No masses, redness, edema, swelling, or associated skin lesions Alignment: Symmetrical Functional ROM: Decreased ROM, to the right Stability: No instability detected Muscle Tone/Strength: Functionally intact. No obvious neuro-muscular anomalies detected. Sensory (Neurological): Movement-associated pain Palpation: Complains of area being tender to palpation              Upper Extremity (UE) Exam    Side: Right upper extremity  Side: Left upper extremity  Skin & Extremity Inspection: Skin color, temperature, and hair growth are WNL. No peripheral edema or cyanosis. No masses, redness, swelling, asymmetry, or associated skin lesions. No contractures.  Skin & Extremity Inspection: Skin color, temperature, and hair growth are WNL. No peripheral edema or cyanosis. No masses, redness, swelling, asymmetry, or associated skin lesions. No contractures.  Functional ROM: Unrestricted ROM          Functional ROM: Unrestricted ROM          Muscle Tone/Strength: Functionally intact. No obvious neuro-muscular anomalies detected.  Muscle Tone/Strength: Functionally intact. No obvious neuro-muscular anomalies detected.  Sensory (Neurological): Unimpaired          Sensory (Neurological): Unimpaired          Palpation: No palpable anomalies              Palpation: No palpable anomalies              Provocative Test(s):  Phalen's test: deferred Tinel's test: deferred Apley's scratch test (touch opposite shoulder):  Action 1 (Across chest): deferred Action 2 (Overhead): deferred Action 3 (LB reach): deferred   Provocative Test(s):  Phalen's test: deferred Tinel's test: deferred Apley's scratch test (touch opposite shoulder):  Action 1 (Across chest):  deferred Action 2 (Overhead): deferred Action 3 (LB reach): deferred    Thoracic Spine Area Exam  Skin & Axial Inspection: No masses, redness, or swelling Alignment: Symmetrical Functional ROM: Unrestricted ROM Stability: No instability detected Muscle Tone/Strength: Functionally intact. No obvious neuro-muscular anomalies detected. Sensory (Neurological): Unimpaired Muscle strength & Tone: No palpable anomalies  Lumbar Spine Area Exam  Skin & Axial Inspection: No masses, redness, or swelling Alignment: Symmetrical Functional ROM: Diminished ROM       Stability: No instability detected Muscle Tone/Strength: Functionally intact. No obvious neuro-muscular anomalies detected. Sensory (Neurological): Movement-associated pain Palpation: Complains of area being tender to palpation       Provocative Tests: Hyperextension/rotation test: (+) bilaterally for facet joint pain. Lumbar quadrant test (Kemp's test): (+) bilaterally for facet joint pain. Lateral bending test: deferred today       Patrick's Maneuver: (+) for right hip arthralgia             FABER test: (+) for right hip arthralgia             S-I anterior distraction/compression test: deferred today         S-I lateral compression test: deferred today         S-I Thigh-thrust test: deferred today         S-I Gaenslen's test: deferred today          Gait & Posture Assessment  Ambulation: Unassisted Gait: Relatively  normal for age and body habitus Posture: WNL   Lower Extremity Exam    Side: Right lower extremity  Side: Left lower extremity  Stability: No instability observed          Stability: No instability observed          Skin & Extremity Inspection: Skin color, temperature, and hair growth are WNL. No peripheral edema or cyanosis. No masses, redness, swelling, asymmetry, or associated skin lesions. No contractures.  Skin & Extremity Inspection: Skin color, temperature, and hair growth are WNL. No peripheral edema or  cyanosis. No masses, redness, swelling, asymmetry, or associated skin lesions. No contractures.  Functional ROM: Decreased ROM for hip joint          Functional ROM: Unrestricted ROM                  Muscle Tone/Strength: Functionally intact. No obvious neuro-muscular anomalies detected.  Muscle Tone/Strength: Functionally intact. No obvious neuro-muscular anomalies detected.  Sensory (Neurological): Articular pain pattern  Sensory (Neurological): Unimpaired  Palpation: No palpable anomalies  Palpation: No palpable anomalies   Assessment  Primary Diagnosis & Pertinent Problem List: The primary encounter diagnosis was Chronic upper back pain (Secondary Area of Pain) (Bilateral) (L>R). Diagnoses of Thoracic spondylosis with radiculopathy (Right), Chronic midline low back pain without sciatica, Thoracic radiculitis (R>L), Thoracic spondylosis, Cervical facet syndrome (Bilateral) (L>R), Spondylosis without myelopathy or radiculopathy, cervical region, Chronic shoulder pain (Tertiary Area of Pain) (Bilateral) (L>R), Lumbar facet syndrome (Right), Spondylosis without myelopathy or radiculopathy, lumbar region, Osteoarthritis of hip (Bilateral), and Chronic hip pain (Bilateral) (R>L) were also pertinent to this visit.  Status Diagnosis  Resolved Resolved Resolved 1. Chronic upper back pain (Secondary Area of Pain) (Bilateral) (L>R)   2. Thoracic spondylosis with radiculopathy (Right)   3. Chronic midline low back pain without sciatica   4. Thoracic radiculitis (R>L)   5. Thoracic spondylosis   6. Cervical facet syndrome (Bilateral) (L>R)   7. Spondylosis without myelopathy or radiculopathy, cervical region   8. Chronic shoulder pain (Tertiary Area of Pain) (Bilateral) (L>R)   9. Lumbar facet syndrome (Right)   10. Spondylosis without myelopathy or radiculopathy, lumbar region   11. Osteoarthritis of hip (Bilateral)   12. Chronic hip pain (Bilateral) (R>L)     Problems updated and reviewed during  this visit: Problem  Osteoarthritis of hip (Bilateral)  Chronic shoulder pain (Tertiary Area of Pain) (Bilateral) (L>R)  Chronic hip pain (Bilateral) (R>L)  Chronic neck pain (Primary Area of Pain) (Bilateral) (L>R)   Plan of Care  Pharmacotherapy (Medications Ordered): No orders of the defined types were placed in this encounter.  Medications administered today: Patsey Pitstick had no medications administered during this visit.   Procedure Orders     HIP INJECTION     Radiofrequency,Cervical     CERVICAL FACET (MEDIAL BRANCH NERVE BLOCK)      LUMBAR FACET(MEDIAL BRANCH NERVE BLOCK) [facility] Lab Orders  No laboratory test(s) ordered today   Imaging Orders  No imaging studies ordered today   Referral Orders  No referral(s) requested today   Interventional management options: Planned, scheduled, and/or pending:   Therapeutic right IA Hip joint injection #2, under fluoro and IV sedation  Schedule her for therapeutic right cervical facet RFA #1 under fluoro and IV sedation    Considering:   Diagnostic bilateral cervical facet block Possible bilateral cervical facet radiofrequencyablation Diagnostic left-sided cervical epidural steroid injection Diagnostic bilateral intra-articular shoulder joint injection Diagnostic bilateral suprascapular nerve block Possible  bilateral suprascapular nerve radiofrequencyablation Diagnostic bilateral lumbar facet block Possible bilateral lumbar facet radiofrequencyablation  Diagnostic bilateral intra-articular hip joint injection Diagnostic bilateral femoral and obturator tickler branch blocks Possible bilateral hip joint radiofrequencyablation  Diagnosticright intra-articular knee joint injection Possible series of 5, right-sided, intra-articular knee joint injections with Hyalgan.  Possible right sided genicular nerve block Possible right sided genicular nerve radiofrequencyablation Possible left-sided lumbar epidural steroid  injection   Palliative PRN treatment(s):   Palliativebilateral suprascapular nerve block  Palliativeright intra-articular knee joint injection    Provider-requested follow-up: Return for Procedure (w/ sedation): (R) IA Hip joint inj. #2, followed by (R) C-FCT RFA #1.  Future Appointments  Date Time Provider [place]  09/23/2018  2:00 PM Buena Irish, [place] [facility] None  10/11/2018  2:15 PM Leone Haven, MD [facility]  10/17/2018  1:00 PM Jerl Mina, PT [facility] None  10/21/2018  1:20 PM [facility]  10/21/2018  3:00 PM Jerl Mina, PT [facility] None  10/22/2018 10:00 AM Harlin Heys, MD [facility] None  11/04/2018  2:00 PM Jerl Mina, PT [facility] None  11/11/2018  1:00 PM Jerl Mina, PT [facility] None  11/12/2018  1:45 PM Vevelyn Francois, NP [facility] None  11/18/2018  1:00 PM Jerl Mina, PT [facility] None  11/25/2018  1:00 PM Jerl Mina, PT [facility] None  12/02/2018  1:00 PM Jerl Mina, PT [facility] None  12/09/2018  1:00 PM Jerl Mina, PT [facility] None   Primary Care Physician: Leone Haven, MD Location: [facility] Note by: Gaspar Cola, MD Date: 09/16/2018; Time: 12:41 PM

## 2018-09-16 ENCOUNTER — Ambulatory Visit: Payer: BLUE CROSS/BLUE SHIELD | Attending: Pain Medicine | Admitting: Pain Medicine

## 2018-09-16 ENCOUNTER — Encounter: Payer: Self-pay | Admitting: Pain Medicine

## 2018-09-16 ENCOUNTER — Other Ambulatory Visit: Payer: Self-pay

## 2018-09-16 VITALS — BP 120/79 | HR 97 | Temp 98.2°F | Resp 18 | Ht 64.0 in | Wt 156.0 lb

## 2018-09-16 DIAGNOSIS — F329 Major depressive disorder, single episode, unspecified: Secondary | ICD-10-CM | POA: Diagnosis not present

## 2018-09-16 DIAGNOSIS — M4724 Other spondylosis with radiculopathy, thoracic region: Secondary | ICD-10-CM | POA: Diagnosis not present

## 2018-09-16 DIAGNOSIS — Z79891 Long term (current) use of opiate analgesic: Secondary | ICD-10-CM | POA: Insufficient documentation

## 2018-09-16 DIAGNOSIS — M25511 Pain in right shoulder: Secondary | ICD-10-CM

## 2018-09-16 DIAGNOSIS — Z7951 Long term (current) use of inhaled steroids: Secondary | ICD-10-CM | POA: Diagnosis not present

## 2018-09-16 DIAGNOSIS — M545 Low back pain: Secondary | ICD-10-CM | POA: Diagnosis not present

## 2018-09-16 DIAGNOSIS — M542 Cervicalgia: Secondary | ICD-10-CM | POA: Diagnosis present

## 2018-09-16 DIAGNOSIS — M8580 Other specified disorders of bone density and structure, unspecified site: Secondary | ICD-10-CM | POA: Insufficient documentation

## 2018-09-16 DIAGNOSIS — Z6825 Body mass index (BMI) 25.0-25.9, adult: Secondary | ICD-10-CM | POA: Diagnosis not present

## 2018-09-16 DIAGNOSIS — Z87891 Personal history of nicotine dependence: Secondary | ICD-10-CM | POA: Insufficient documentation

## 2018-09-16 DIAGNOSIS — M25551 Pain in right hip: Secondary | ICD-10-CM | POA: Insufficient documentation

## 2018-09-16 DIAGNOSIS — M25559 Pain in unspecified hip: Secondary | ICD-10-CM

## 2018-09-16 DIAGNOSIS — M797 Fibromyalgia: Secondary | ICD-10-CM | POA: Insufficient documentation

## 2018-09-16 DIAGNOSIS — M7061 Trochanteric bursitis, right hip: Secondary | ICD-10-CM | POA: Insufficient documentation

## 2018-09-16 DIAGNOSIS — M7551 Bursitis of right shoulder: Secondary | ICD-10-CM | POA: Insufficient documentation

## 2018-09-16 DIAGNOSIS — M5414 Radiculopathy, thoracic region: Secondary | ICD-10-CM | POA: Diagnosis not present

## 2018-09-16 DIAGNOSIS — E782 Mixed hyperlipidemia: Secondary | ICD-10-CM | POA: Diagnosis not present

## 2018-09-16 DIAGNOSIS — G894 Chronic pain syndrome: Secondary | ICD-10-CM | POA: Diagnosis not present

## 2018-09-16 DIAGNOSIS — M549 Dorsalgia, unspecified: Secondary | ICD-10-CM | POA: Diagnosis not present

## 2018-09-16 DIAGNOSIS — M47816 Spondylosis without myelopathy or radiculopathy, lumbar region: Secondary | ICD-10-CM

## 2018-09-16 DIAGNOSIS — M25552 Pain in left hip: Secondary | ICD-10-CM | POA: Insufficient documentation

## 2018-09-16 DIAGNOSIS — M47812 Spondylosis without myelopathy or radiculopathy, cervical region: Secondary | ICD-10-CM

## 2018-09-16 DIAGNOSIS — Z8249 Family history of ischemic heart disease and other diseases of the circulatory system: Secondary | ICD-10-CM | POA: Diagnosis not present

## 2018-09-16 DIAGNOSIS — F411 Generalized anxiety disorder: Secondary | ICD-10-CM | POA: Diagnosis not present

## 2018-09-16 DIAGNOSIS — M47894 Other spondylosis, thoracic region: Secondary | ICD-10-CM

## 2018-09-16 DIAGNOSIS — E663 Overweight: Secondary | ICD-10-CM | POA: Diagnosis not present

## 2018-09-16 DIAGNOSIS — M25512 Pain in left shoulder: Secondary | ICD-10-CM

## 2018-09-16 DIAGNOSIS — M160 Bilateral primary osteoarthritis of hip: Secondary | ICD-10-CM

## 2018-09-16 DIAGNOSIS — G8929 Other chronic pain: Secondary | ICD-10-CM

## 2018-09-16 DIAGNOSIS — Z79899 Other long term (current) drug therapy: Secondary | ICD-10-CM | POA: Diagnosis not present

## 2018-09-16 NOTE — Patient Instructions (Addendum)
____________________________________________________________________________________________  Preparing for Procedure with Sedation  Instructions: . Oral Intake: Do not eat or drink anything for at least 8 hours prior to your procedure. . Transportation: Public transportation is not allowed. Bring an adult driver. The driver must be physically present in our waiting room before any procedure can be started. . Physical Assistance: Bring an adult physically capable of assisting you, in the event you need help. This adult should keep you company at home for at least 6 hours after the procedure. . Blood Pressure Medicine: Take your blood pressure medicine with a sip of water the morning of the procedure. . Blood thinners: Notify our staff if you are taking any blood thinners. Depending on which one you take, there will be specific instructions on how and when to stop it. . Diabetics on insulin: Notify the staff so that you can be scheduled 1st case in the morning. If your diabetes requires high dose insulin, take only  of your normal insulin dose the morning of the procedure and notify the staff that you have done so. . Preventing infections: Shower with an antibacterial soap the morning of your procedure. . Build-up your immune system: Take 1000 mg of Vitamin C with every meal (3 times a day) the day prior to your procedure. . Antibiotics: Inform the staff if you have a condition or reason that requires you to take antibiotics before dental procedures. . Pregnancy: If you are pregnant, call and cancel the procedure. . Sickness: If you have a cold, fever, or any active infections, call and cancel the procedure. . Arrival: You must be in the facility at least 30 minutes prior to your scheduled procedure. . Children: Do not bring children with you. . Dress appropriately: Bring dark clothing that you would not mind if they get stained. . Valuables: Do not bring any jewelry or valuables.  Procedure  appointments are reserved for interventional treatments only. . No Prescription Refills. . No medication changes will be discussed during procedure appointments. . No disability issues will be discussed.  Reasons to call and reschedule or cancel your procedure: (Following these recommendations will minimize the risk of a serious complication.) . Surgeries: Avoid having procedures within 2 weeks of any surgery. (Avoid for 2 weeks before or after any surgery). . Flu Shots: Avoid having procedures within 2 weeks of a flu shots or . (Avoid for 2 weeks before or after immunizations). . Barium: Avoid having a procedure within 7-10 days after having had a radiological study involving the use of radiological contrast. (Myelograms, Barium swallow or enema study). . Heart attacks: Avoid any elective procedures or surgeries for the initial 6 months after a "Myocardial Infarction" (Heart Attack). . Blood thinners: It is imperative that you stop these medications before procedures. Let us know if you if you take any blood thinner.  . Infection: Avoid procedures during or within two weeks of an infection (including chest colds or gastrointestinal problems). Symptoms associated with infections include: Localized redness, fever, chills, night sweats or profuse sweating, burning sensation when voiding, cough, congestion, stuffiness, runny nose, sore throat, diarrhea, nausea, vomiting, cold or Flu symptoms, recent or current infections. It is specially important if the infection is over the area that we intend to treat. . Heart and lung problems: Symptoms that may suggest an active cardiopulmonary problem include: cough, chest pain, breathing difficulties or shortness of breath, dizziness, ankle swelling, uncontrolled high or unusually low blood pressure, and/or palpitations. If you are experiencing any of these symptoms, cancel   your procedure and contact your primary care physician for an evaluation.  Remember:   Regular Business hours are:  Monday to Thursday 8:00 AM to 4:00 PM  Provider's Schedule: Milinda Pointer, MD:  Procedure days: Tuesday and Thursday 7:30 AM to 4:00 PM  Gillis Santa, MD:  Procedure days: Monday and Wednesday 7:30 AM to 4:00 PM ____________________________________________________________________________________________   Preparing for Procedure with Sedation Instructions: . Oral Intake: Do not eat or drink anything for at least 8 hours prior to your procedure. . Transportation: Public transportation is not allowed. Bring an adult driver. The driver must be physically present in our waiting room before any procedure can be started. Marland Kitchen Physical Assistance: Bring an adult capable of physically assisting you, in the event you need help. . Blood Pressure Medicine: Take your blood pressure medicine with a sip of water the morning of the procedure. . Insulin: Take only  of your normal insulin dose. . Preventing infections: Shower with an antibacterial soap the morning of your procedure. . Build-up your immune system: Take 1000 mg of Vitamin C with every meal (3 times a day) the day prior to your procedure. . Pregnancy: If you are pregnant, call and cancel the procedure. . Sickness: If you have a cold, fever, or any active infections, call and cancel the procedure. . Arrival: You must be in the facility at least 30 minutes prior to your scheduled procedure. . Children: Do not bring children with you. . Dress appropriately: Bring dark clothing that you would not mind if they get stained. . Valuables: Do not bring any jewelry or valuables. Procedure appointments are reserved for interventional treatments only. Marland Kitchen No Prescription Refills. . No medication changes will be discussed during procedure appointments. No disability issues will be discussed.Radiofrequency Lesioning Radiofrequency lesioning is a procedure that is performed to relieve pain. The procedure is often used for  back, neck, or arm pain. Radiofrequency lesioning involves the use of a machine that creates radio waves to make heat. During the procedure, the heat is applied to the nerve that carries the pain signal. The heat damages the nerve and interferes with the pain signal. Pain relief usually starts about 2 weeks after the procedure and lasts for 6 months to 1 year. Tell a health care provider about: Any allergies you have. All medicines you are taking, including vitamins, herbs, eye drops, creams, and over-the-counter medicines. Any problems you or family members have had with anesthetic medicines. Any blood disorders you have. Any surgeries you have had. Any medical conditions you have. Whether you are pregnant or may be pregnant. What are the risks? Generally, this is a safe procedure. However, problems may occur, including: Pain or soreness at the injection site. Infection at the injection site. Damage to nerves or blood vessels.  What happens before the procedure? Ask your health care provider about: Changing or stopping your regular medicines. This is especially important if you are taking diabetes medicines or blood thinners. Taking medicines such as aspirin and ibuprofen. These medicines can thin your blood. Do not take these medicines before your procedure if your health care provider instructs you not to. Follow instructions from your health care provider about eating or drinking restrictions. Plan to have someone take you home after the procedure. If you go home right after the procedure, plan to have someone with you for 24 hours. What happens during the procedure? You will be given one or more of the following: A medicine to help you relax (sedative). A medicine  to numb the area (local anesthetic). You will be awake during the procedure. You will need to be able to talk with the health care provider during the procedure. With the help of a type of X-ray (fluoroscopy), the health  care provider will insert a radiofrequency needle into the area to be treated. Next, a wire that carries the radio waves (electrode) will be put through the radiofrequency needle. An electrical pulse will be sent through the electrode to verify the correct nerve. You will feel a tingling sensation, and you may have muscle twitching. Then, the tissue that is around the needle tip will be heated by an electric current that is passed using the radiofrequency machine. This will numb the nerves. A bandage (dressing) will be put on the insertion area after the procedure is done. The procedure may vary among health care providers and hospitals. What happens after the procedure? Your blood pressure, heart rate, breathing rate, and blood oxygen level will be monitored often until the medicines you were given have worn off. Return to your normal activities as directed by your health care provider. This information is not intended to replace advice given to you by your health care provider. Make sure you discuss any questions you have with your health care provider. Document Released: 06/21/2011 Document Revised: 03/30/2016 Document Reviewed: 11/30/2014 Elsevier Interactive Patient Education  2018 [place] Facet Blocks Patient Information  Description: The facets are joints in the spine between the vertebrae.  Like any joints in the body, facets can become irritated and painful.  Arthritis can also effect the facets.  By injecting steroids and local anesthetic in and around these joints, we can temporarily block the nerve supply to them.  Steroids act directly on irritated nerves and tissues to reduce selling and inflammation which often leads to decreased pain.  Facet blocks may be done anywhere along the spine from the neck to the low back depending upon the location of your pain.   After numbing the skin with local anesthetic (like Novocaine), a small needle is passed onto the facet joints under x-ray  guidance.  You may experience a sensation of pressure while this is being done.  The entire block usually lasts about 15-25 minutes.   Conditions which may be treated by facet blocks:  Low back/buttock pain Neck/shoulder pain Certain types of headaches  Preparation for the injection:  Do not eat any solid food or dairy products within 8 hours of your appointment. You may drink clear liquid up to 3 hours before appointment.  Clear liquids include water, black coffee, juice or soda.  No milk or cream please. You may take your regular medication, including pain medications, with a sip of water before your appointment.  Diabetics should hold regular insulin (if taken separately) and take 1/2 normal NPH dose the morning of the procedure.  Carry some sugar containing items with you to your appointment. A driver must accompany you and be prepared to drive you home after your procedure. Bring all your current medications with you. An IV may be inserted and sedation may be given at the discretion of the physician. A blood pressure cuff, EKG and other monitors will often be applied during the procedure.  Some patients may need to have extra oxygen administered for a short period. You will be asked to provide medical information, including your allergies and medications, prior to the procedure.  We must know immediately if you are taking blood thinners (like Coumadin/Warfarin) or if you are  allergic to IV iodine contrast (dye).  We must know if you could possible be pregnant.  Possible side-effects:  Bleeding from needle site Infection (rare, may require surgery) Nerve injury (rare) Numbness & tingling (temporary) Difficulty urinating (rare, temporary) Spinal headache (a headache worse with upright posture) Light-headedness (temporary) Pain at injection site (serveral days) Decreased blood pressure (rare, temporary) Weakness in arm/leg (temporary) Pressure sensation in back/neck  (temporary)   Call if you experience:  Fever/chills associated with headache or increased back/neck pain Headache worsened by an upright position New onset, weakness or numbness of an extremity below the injection site Hives or difficulty breathing (go to the emergency room) Inflammation or drainage at the injection site(s) Severe back/neck pain greater than usual New symptoms which are concerning to you  Please note:  Although the local anesthetic injected can often make your back or neck feel good for several hours after the injection, the pain will likely return. It takes 3-7 days for steroids to work.  You may not notice any pain relief for at least one week.  If effective, we will often do a series of 2-3 injections spaced 3-6 weeks apart to maximally decrease your pain.  After the initial series, you may be a candidate for a more permanent nerve block of the facets.  If you have any questions, please call [phone] . [facility]

## 2018-09-16 NOTE — Progress Notes (Signed)
Nursing Pain Medication Assessment:  Safety precautions to be maintained throughout the outpatient stay will include: orient to surroundings, keep bed in low position, maintain call bell within reach at all times, provide assistance with transfer out of bed and ambulation.  Medication Inspection Compliance: Pill count conducted under aseptic conditions, in front of the patient. Neither the pills nor the bottle was removed from the patient's sight at any time. Once count was completed pills were immediately returned to the patient in their original bottle.  Medication: Oxycodone IR Pill/Patch Count: 67 of 120 pills remain Pill/Patch Appearance: Markings consistent with prescribed medication Bottle Appearance: Standard pharmacy container. Clearly labeled. Filled Date: 10 / 31 / 2019 Last Medication intake:  Today

## 2018-09-23 ENCOUNTER — Ambulatory Visit: Payer: BLUE CROSS/BLUE SHIELD | Admitting: Psychology

## 2018-09-24 ENCOUNTER — Ambulatory Visit (INDEPENDENT_AMBULATORY_CARE_PROVIDER_SITE_OTHER): Payer: BLUE CROSS/BLUE SHIELD | Admitting: Psychology

## 2018-09-24 DIAGNOSIS — F331 Major depressive disorder, recurrent, moderate: Secondary | ICD-10-CM | POA: Diagnosis not present

## 2018-09-25 ENCOUNTER — Telehealth: Payer: Self-pay | Admitting: Family Medicine

## 2018-09-25 DIAGNOSIS — E663 Overweight: Secondary | ICD-10-CM

## 2018-09-25 DIAGNOSIS — Z1329 Encounter for screening for other suspected endocrine disorder: Secondary | ICD-10-CM

## 2018-09-25 DIAGNOSIS — E785 Hyperlipidemia, unspecified: Secondary | ICD-10-CM

## 2018-09-25 DIAGNOSIS — Z130 Encounter for screening for diseases of the blood and blood-forming organs and certain disorders involving the immune mechanism: Secondary | ICD-10-CM

## 2018-09-25 NOTE — Telephone Encounter (Signed)
Please advise 

## 2018-09-25 NOTE — Telephone Encounter (Signed)
Patient called and  would like to know if Dr. Caryl Bis has reviewed her imaging results from ED on 10/31 and if so patient inquired if Dr. Caryl Bis thinks that she should follow up with cardiologist. Please advise.

## 2018-09-25 NOTE — Telephone Encounter (Signed)
Copied from [place] [id number]. Topic: Quick Communication - See Telephone Encounter >> Sep 25, 2018  1:58 PM Sheran Luz wrote: CRM for notification. See Telephone encounter for: 09/25/18.  Patient would like to know if Dr. Caryl Bis has reviewed her imaging results from ED on 10/31 and if so patient inquired if Dr. Caryl Bis thinks that she should follow up with cardiologist. Please advise.

## 2018-09-26 ENCOUNTER — Ambulatory Visit (HOSPITAL_BASED_OUTPATIENT_CLINIC_OR_DEPARTMENT_OTHER): Payer: BLUE CROSS/BLUE SHIELD | Admitting: Pain Medicine

## 2018-09-26 ENCOUNTER — Encounter: Payer: Self-pay | Admitting: Pain Medicine

## 2018-09-26 ENCOUNTER — Ambulatory Visit
Admission: RE | Admit: 2018-09-26 | Discharge: 2018-09-26 | Disposition: A | Discharge status: Home / Self Care | Payer: BLUE CROSS/BLUE SHIELD | Source: Ambulatory Visit | Attending: Pain Medicine | Admitting: Pain Medicine

## 2018-09-26 VITALS — BP 112/67 | HR 73 | Temp 98.1°F | Resp 16 | Ht 64.0 in | Wt 160.0 lb

## 2018-09-26 DIAGNOSIS — Z91018 Allergy to other foods: Secondary | ICD-10-CM | POA: Diagnosis not present

## 2018-09-26 DIAGNOSIS — Z79891 Long term (current) use of opiate analgesic: Secondary | ICD-10-CM | POA: Diagnosis not present

## 2018-09-26 DIAGNOSIS — M25551 Pain in right hip: Secondary | ICD-10-CM

## 2018-09-26 DIAGNOSIS — M160 Bilateral primary osteoarthritis of hip: Secondary | ICD-10-CM | POA: Insufficient documentation

## 2018-09-26 DIAGNOSIS — G8929 Other chronic pain: Secondary | ICD-10-CM

## 2018-09-26 DIAGNOSIS — Z79899 Other long term (current) drug therapy: Secondary | ICD-10-CM | POA: Insufficient documentation

## 2018-09-26 DIAGNOSIS — F419 Anxiety disorder, unspecified: Secondary | ICD-10-CM | POA: Insufficient documentation

## 2018-09-26 DIAGNOSIS — Z7951 Long term (current) use of inhaled steroids: Secondary | ICD-10-CM | POA: Diagnosis not present

## 2018-09-26 DIAGNOSIS — F40298 Other specified phobia: Secondary | ICD-10-CM | POA: Insufficient documentation

## 2018-09-26 MED ORDER — IOPAMIDOL (ISOVUE-M 200) INJECTION 41%
10.0000 mL | Freq: Once | INTRAMUSCULAR | Status: AC
  Administered 2018-09-26: 10 mL via EPIDURAL
  Filled 2018-09-26: qty 10

## 2018-09-26 MED ORDER — LIDOCAINE HCL 2 % IJ SOLN
20.0000 mL | Freq: Once | INTRAMUSCULAR | Status: DC
  Filled 2018-09-26: qty 40

## 2018-09-26 MED ORDER — FENTANYL CITRATE (PF) 100 MCG/2ML IJ SOLN
25.0000 ug | INTRAMUSCULAR | Status: DC | PRN
  Administered 2018-09-26: 100 ug via INTRAVENOUS
  Filled 2018-09-26: qty 2

## 2018-09-26 MED ORDER — ROPIVACAINE HCL 2 MG/ML IJ SOLN
4.0000 mL | Freq: Once | INTRAMUSCULAR | Status: AC
  Administered 2018-09-26: 4 mL via INTRA_ARTICULAR
  Filled 2018-09-26: qty 10

## 2018-09-26 MED ORDER — METHYLPREDNISOLONE ACETATE 80 MG/ML IJ SUSP
80.0000 mg | Freq: Once | INTRAMUSCULAR | Status: AC
  Administered 2018-09-26: 80 mg via INTRA_ARTICULAR
  Filled 2018-09-26: qty 1

## 2018-09-26 MED ORDER — MIDAZOLAM HCL 5 MG/5ML IJ SOLN
1.0000 mg | INTRAMUSCULAR | Status: DC | PRN
  Administered 2018-09-26: 4 mg via INTRAVENOUS
  Filled 2018-09-26: qty 5

## 2018-09-26 MED ORDER — LACTATED RINGERS IV SOLN
1000.0000 mL | Freq: Once | INTRAVENOUS | Status: AC
  Administered 2018-09-26: 1000 mL via INTRAVENOUS

## 2018-09-26 NOTE — Telephone Encounter (Signed)
Scheduled

## 2018-09-26 NOTE — Progress Notes (Signed)
Safety precautions to be maintained throughout the outpatient stay will include: orient to surroundings, keep bed in low position, maintain call bell within reach at all times, provide assistance with transfer out of bed and ambulation.  

## 2018-09-26 NOTE — Telephone Encounter (Signed)
Pt wanted to know if she could have her CPE labs done before her appt on 12/6? Sent to PCP to advise and place orders.

## 2018-09-26 NOTE — Patient Instructions (Signed)

## 2018-09-26 NOTE — Telephone Encounter (Signed)
Based on review of her CT scan I do not see an indication to have her see cardiology based off of the results.  She should follow-up in the office for [place] to determine whether or not clinically she should be referred.  Please try to get her set up with myself or Lauren for follow-up.

## 2018-09-26 NOTE — Progress Notes (Signed)
Patient's Name: Nicole Walter  MRN: [id number]  Referring Provider: Leone Haven, MD  DOB: 1960-11-22  PCP: Leone Haven, MD  DOS: 09/26/2018  Note by: Gaspar Cola, MD  Service setting: Ambulatory outpatient  Specialty: Interventional Pain Management  Patient type: Established  Location: [facility] (AMB) Pain Management Facility  Visit type: Interventional Procedure   Primary Reason for Visit: Interventional Pain Management Treatment. CC: Hip Pain (right)  Procedure:          Anesthesia, Analgesia, Anxiolysis:  Type: Intra-Articular Hip Injection #2  Primary Purpose: Diagnostic Region: Posterolateral hip joint area. Level: Lower pelvic and hip joint level. Target Area: Superior aspect of the hip joint cavity, going thru the superior portion of the capsular ligament. Approach: Posterolateral approach. Laterality: Right-Sided  Type: Moderate (Conscious) Sedation combined with Local Anesthesia Indication(s): Analgesia and Anxiety Route: Intravenous (IV) IV Access: Secured Sedation: Meaningful verbal contact was maintained at all times during the procedure  Local Anesthetic: Lidocaine 1-2%  Position: Lateral Decubitus with bad side up Prepped Area: Entire Posterolateral hip area. Prepping solution: ChloraPrep (2% chlorhexidine gluconate and 70% isopropyl alcohol)   Indications: 1. Osteoarthritis of hip (Bilateral)   2. Chronic hip pain (Right)    Pain Score: Pre-procedure: 2 /10 Post-procedure: 0-No pain/10  Pre-op Assessment:  Nicole Walter is a 57 y.o. (year old), female patient, seen today for interventional treatment. She  has a past surgical history that includes Cholecystectomy; Tubal ligation; Ankle surgery; Nasal sinus surgery; Tonsillectomy; Colonoscopy; Esophagogastroduodenoscopy (egd) with propofol (N/A, 02/22/2016); Colonoscopy with propofol (N/A, 02/22/2016); and Breast biopsy (Left, 1997). Nicole Walter has a current medication list which includes the following  prescription(s): dicyclomine, fluocinonide cream, fluocinonide ointment, fluticasone, ibuprofen, linaclotide, linzess, lorazepam, multi-vitamins, naloxone, fish oil, oxycodone, oxycodone, oxycodone, rabeprazole, sucralfate, trintellix, and gabapentin, and the following Facility-Administered Medications: fentanyl, lactated ringers, lidocaine, and midazolam. Her primarily concern today is the Hip Pain (right)  Initial Vital Signs:  Pulse/HCG Rate: 73ECG Heart Rate: 66 Temp: 98.3 F (36.8 C) Resp: 16 BP: 124/70 SpO2: 99 %  BMI: Estimated body mass index is 27.46 kg/m as calculated from the following:   Height as of this encounter: 5\' 4"  (1.626 m).   Weight as of this encounter: 160 lb (72.6 kg).  Risk Assessment: Allergies: Reviewed. She is allergic to other; food; and pea.  Allergy Precautions: None required Coagulopathies: Reviewed. None identified.  Blood-thinner therapy: None at this time Active Infection(s): Reviewed. None identified. Nicole Walter is afebrile  Site Confirmation: Nicole Walter was asked to confirm the procedure and laterality before marking the site Procedure checklist: Completed Consent: Before the procedure and under the influence of no sedative(s), amnesic(s), or anxiolytics, the patient was informed of the treatment options, risks and possible complications. To fulfill our ethical and legal obligations, as recommended by the American Medical Association's Code of Ethics, I have informed the patient of my clinical impression; the nature and purpose of the treatment or procedure; the risks, benefits, and possible complications of the intervention; the alternatives, including doing nothing; the risk(s) and benefit(s) of the alternative treatment(s) or procedure(s); and the risk(s) and benefit(s) of doing nothing. The patient was provided information about the general risks and possible complications associated with the procedure. These may include, but are not limited to: failure  to achieve desired goals, infection, bleeding, organ or nerve damage, allergic reactions, paralysis, and death. In addition, the patient was informed of those risks and complications associated to the procedure, such as failure to decrease pain; infection;  bleeding; organ or nerve damage with subsequent damage to sensory, motor, and/or autonomic systems, resulting in permanent pain, numbness, and/or weakness of one or several areas of the body; allergic reactions; (i.e.: anaphylactic reaction); and/or death. Furthermore, the patient was informed of those risks and complications associated with the medications. These include, but are not limited to: allergic reactions (i.e.: anaphylactic or anaphylactoid reaction(s)); adrenal axis suppression; blood sugar elevation that in diabetics may result in ketoacidosis or comma; water retention that in patients with history of congestive heart failure may result in shortness of breath, pulmonary edema, and decompensation with resultant heart failure; weight gain; swelling or edema; medication-induced neural toxicity; particulate matter embolism and blood vessel occlusion with resultant organ, and/or nervous system infarction; and/or aseptic necrosis of one or more joints. Finally, the patient was informed that Medicine is not an exact science; therefore, there is also the possibility of unforeseen or unpredictable risks and/or possible complications that may result in a catastrophic outcome. The patient indicated having understood very clearly. We have given the patient no guarantees and we have made no promises. Enough time was given to the patient to ask questions, all of which were answered to the patient's satisfaction. Nicole Walter has indicated that she wanted to continue with the procedure. Attestation: I, the ordering provider, attest that I have discussed with the patient the benefits, risks, side-effects, alternatives, likelihood of achieving goals, and potential  problems during recovery for the procedure that I have provided informed consent. Date  Time: 09/26/2018  8:32 AM  Pre-Procedure Preparation:  Monitoring: As per clinic protocol. Respiration, ETCO2, SpO2, BP, heart rate and rhythm monitor placed and checked for adequate function Safety Precautions: Patient was assessed for positional comfort and pressure points before starting the procedure. Time-out: I initiated and conducted the "Time-out" before starting the procedure, as per protocol. The patient was asked to participate by confirming the accuracy of the "Time Out" information. Verification of the correct person, site, and procedure were performed and confirmed by me, the nursing staff, and the patient. "Time-out" conducted as per Joint Commission's Universal Protocol (UP.01.01.01). Time: 0941  Description of Procedure:          Safety Precautions: Aspiration looking for blood return was conducted prior to all injections. At no point did we inject any substances, as a needle was being advanced. No attempts were made at seeking any paresthesias. Safe injection practices and needle disposal techniques used. Medications properly checked for expiration dates. SDV (single dose vial) medications used. Description of the Procedure: Protocol guidelines were followed. The patient was placed in position over the fluoroscopy table. The target area was identified and the area prepped in the usual manner. Skin & deeper tissues infiltrated with local anesthetic. Appropriate amount of time allowed to pass for local anesthetics to take effect. The procedure needles were then advanced to the target area. Proper needle placement secured. Negative aspiration confirmed. Solution injected in intermittent fashion, asking for systemic symptoms every 0.5cc of injectate. The needles were then removed and the area cleansed, making sure to leave some of the prepping solution back to take advantage of its long term bactericidal  properties. Vitals:   09/26/18 0946 09/26/18 0957 09/26/18 1007 09/26/18 1017  BP: 130/67 107/67 105/73 112/67  Pulse:      Resp: 10 17 15 16   Temp:  98.2 F (36.8 C)  98.1 F (36.7 C)  TempSrc:  Temporal    SpO2: 95% 93% 97% 99%  Weight:  Height:        Start Time: 0941 hrs. End Time: 0945 hrs. Materials:  Needle(s) Type: Spinal Needle Gauge: 22G Length: 7-in Medication(s): Please see orders for medications and dosing details.  Imaging Guidance (Non-Spinal):          Type of Imaging Technique: Fluoroscopy Guidance (Non-Spinal) Indication(s): Assistance in needle guidance and placement for procedures requiring needle placement in or near specific anatomical locations not easily accessible without such assistance. Exposure Time: Please see nurses notes. Contrast: Before injecting any contrast, we confirmed that the patient did not have an allergy to iodine, shellfish, or radiological contrast. Once satisfactory needle placement was completed at the desired level, radiological contrast was injected. Contrast injected under live fluoroscopy. No contrast complications. See chart for type and volume of contrast used. Fluoroscopic Guidance: I was personally present during the use of fluoroscopy. "Tunnel Vision Technique" used to obtain the best possible view of the target area. Parallax error corrected before commencing the procedure. "Direction-depth-direction" technique used to introduce the needle under continuous pulsed fluoroscopy. Once target was reached, antero-posterior, oblique, and lateral fluoroscopic projection used confirm needle placement in all planes. Images permanently stored in EMR. Interpretation: I personally interpreted the imaging intraoperatively. Adequate needle placement confirmed in multiple planes. Appropriate spread of contrast into desired area was observed. No evidence of afferent or efferent intravascular uptake. Permanent images saved into the patient's  record.  Antibiotic Prophylaxis:   Anti-infectives (From admission, onward)   None     Indication(s): None identified  Post-operative Assessment:  Post-procedure Vital Signs:  Pulse/HCG Rate: 7371 Temp: 98.1 F (36.7 C) Resp: 16 BP: 112/67 SpO2: 99 %  EBL: None  Complications: No immediate post-treatment complications observed by team, or reported by patient.  Note: The patient tolerated the entire procedure well. A repeat set of vitals were taken after the procedure and the patient was kept under observation following institutional policy, for this type of procedure. Post-procedural neurological assessment was performed, showing return to baseline, prior to discharge. The patient was provided with post-procedure discharge instructions, including a section on how to identify potential problems. Should any problems arise concerning this procedure, the patient was given instructions to immediately contact us, at any time, without hesitation. In any case, we plan to contact the patient by telephone for a follow-up status report regarding this interventional procedure.  Comments:  No additional relevant information.  Plan of Care    Imaging Orders     DG C-Arm 1-60 Min-No Report  Procedure Orders     HIP INJECTION  Medications ordered for procedure: Meds ordered this encounter  Medications  . iopamidol (ISOVUE-M) 41 % intrathecal injection 10 mL    Must be Myelogram-compatible. If not available, you may substitute with a water-soluble, non-ionic, hypoallergenic, myelogram-compatible radiological contrast medium.  Marland Kitchen lidocaine (XYLOCAINE) 2 % (with pres) injection 400 mg  . midazolam (VERSED) 5 MG/5ML injection 1-2 mg    Make sure Flumazenil is available in the pyxis when using this medication. If oversedation occurs, administer 0.2 mg IV over 15 sec. If after 45 sec no response, administer 0.2 mg again over 1 min; may repeat at 1 min intervals; not to exceed 4 doses (1 mg)  .  fentaNYL (SUBLIMAZE) injection 25-50 mcg    Make sure Narcan is available in the pyxis when using this medication. In the event of respiratory depression (RR< 8/min): Titrate NARCAN (naloxone) in increments of 0.1 to 0.2 mg IV at 2-3 minute intervals, until desired degree of  reversal.  . lactated ringers infusion 1,000 mL  . methylPREDNISolone acetate (DEPO-MEDROL) injection 80 mg  . ropivacaine (PF) 2 mg/mL (0.2%) (NAROPIN) injection 4 mL   Medications administered: We administered iopamidol, midazolam, fentaNYL, lactated ringers, methylPREDNISolone acetate, and ropivacaine (PF) 2 mg/mL (0.2%).  See the medical record for exact dosing, route, and time of administration.  Disposition: Discharge home  Discharge Date & Time: 09/26/2018; 1018 hrs.   Physician-requested Follow-up: Return for post-procedure eval (2 wks), w/ Dr. Dossie Arbour.  Future Appointments  Date Time Provider [place]  10/10/2018 10:30 AM Milinda Pointer, MD [facility] None  10/11/2018  2:15 PM Leone Haven, MD [facility]  10/17/2018  1:00 PM Jerl Mina, PT [facility] None  10/21/2018  1:20 PM [facility]  10/21/2018  3:00 PM Jerl Mina, PT [facility] None  10/22/2018 10:00 AM Harlin Heys, MD [facility] None  11/04/2018  2:00 PM Jerl Mina, PT [facility] None  11/11/2018  1:00 PM Jerl Mina, PT [facility] None  11/12/2018  1:45 PM Vevelyn Francois, NP [facility] None  11/18/2018  1:00 PM Jerl Mina, PT [facility] None  11/25/2018  1:00 PM Jerl Mina, PT [facility] None  12/02/2018  1:00 PM Jerl Mina, PT [facility] None  12/09/2018  1:00 PM Jerl Mina, PT [facility] None   Primary Care Physician: Leone Haven, MD Location: [facility] Note by: Gaspar Cola, MD Date: 09/26/2018; Time: 10:27 AM  Disclaimer:  Medicine is not an exact science. The only guarantee in medicine is that nothing is guaranteed. It is  important to note that the decision to proceed with this intervention was based on the information collected from the patient. The Data and conclusions were drawn from the patient's questionnaire, the interview, and the physical examination. Because the information was provided in large part by the patient, it cannot be guaranteed that it has not been purposely or unconsciously manipulated. Every effort has been made to obtain as much relevant data as possible for this evaluation. It is important to note that the conclusions that lead to this procedure are derived in large part from the available data. Always take into account that the treatment will also be dependent on availability of resources and existing treatment guidelines, considered by other Pain Management Practitioners as being common knowledge and practice, at the time of the intervention. For Medico-Legal purposes, it is also important to point out that variation in procedural techniques and pharmacological choices are the acceptable norm. The indications, contraindications, technique, and results of the above procedure should only be interpreted and judged by a Board-Certified Interventional Pain Specialist with extensive familiarity and expertise in the same exact procedure and technique.

## 2018-09-26 NOTE — Telephone Encounter (Signed)
Pt is scheduled for an appt on 10/07/2018 for a follow up appt to see if PCP need to referral patient to see cardiologist or not.   Nicole Walter could you schedule this Same day appt for 10/07/2018 @ 3:15 PM  Thanks

## 2018-09-26 NOTE — Telephone Encounter (Signed)
Ordered

## 2018-09-27 ENCOUNTER — Telehealth: Payer: Self-pay

## 2018-09-27 NOTE — Telephone Encounter (Signed)
Post procedure phone call.  Patient states seh is doing fine.

## 2018-10-07 ENCOUNTER — Encounter: Payer: Self-pay | Admitting: Family Medicine

## 2018-10-07 ENCOUNTER — Ambulatory Visit (INDEPENDENT_AMBULATORY_CARE_PROVIDER_SITE_OTHER): Payer: BLUE CROSS/BLUE SHIELD | Admitting: Family Medicine

## 2018-10-07 DIAGNOSIS — F419 Anxiety disorder, unspecified: Secondary | ICD-10-CM | POA: Diagnosis not present

## 2018-10-07 DIAGNOSIS — F329 Major depressive disorder, single episode, unspecified: Secondary | ICD-10-CM | POA: Diagnosis not present

## 2018-10-07 DIAGNOSIS — R1013 Epigastric pain: Secondary | ICD-10-CM | POA: Diagnosis not present

## 2018-10-07 DIAGNOSIS — J069 Acute upper respiratory infection, unspecified: Secondary | ICD-10-CM | POA: Diagnosis not present

## 2018-10-07 DIAGNOSIS — F32A Depression, unspecified: Secondary | ICD-10-CM

## 2018-10-07 DIAGNOSIS — G8929 Other chronic pain: Secondary | ICD-10-CM

## 2018-10-07 MED ORDER — LORATADINE 10 MG PO TABS: 10.0000 mg | ORAL_TABLET | Freq: Every day | ORAL | 0 refills | Status: DC

## 2018-10-07 NOTE — Assessment & Plan Note (Signed)
Chronic issue.  She will continue management through GI.

## 2018-10-07 NOTE — Assessment & Plan Note (Signed)
Symptoms consistent with viral URI.  Discussed supportive care.  She will trial Claritin.

## 2018-10-07 NOTE — Patient Instructions (Signed)
Nice to see you. Your prior symptoms are likely related to anxiety.  You should continue to see the therapist.  If you have recurrence of your symptoms please be reevaluated. Please monitor your respiratory symptoms.  You can try Claritin or Zyrtec for this. Please continue to see GI for your chronic abdominal discomfort.

## 2018-10-07 NOTE — Progress Notes (Signed)
Tommi Rumps, MD Phone: [phone]  Nicole Walter is a 57 y.o. female who presents today for follow-up.  CC: Dyspnea, abdominal pain, upper respiratory infection  Patient was evaluated in the emergency department on 09/05/2018.  She had progressive dyspnea that got worse as the day went on.  There is no exertional component.  Typically occurred when she was sitting down though did occur when she was up moving around.  Did not worsen with exertion.  She had evaluation in the ED which was unremarkable for a cause.  CT angiogram chest negative for PE.  There were no acute changes noted.  Lab evaluation was unremarkable for a cause.  She notes her anxiety has been pretty stable for her.  She did lose her sister and has had anxiety and depression with that.  She notes no SI.  She is on Trintellix.  She continues to see a therapist.  Ativan did help the day she had shortness of breath and helped her calm down.  Shortness of breath resolved after taking that and has not recurred.  She has seen psychiatry previously for her anxiety and depression.  She also noted constipation and bloating.  She had some upper abdominal pain with that.  She has been seeing GI for this.  She is on Linzess and Bentyl.  She is going for physical therapy for this.  Patient notes onset of scratchy throat and ear discomfort this morning.  Notes she has been sneezing.  No cough.  No fever.  Her ears feel irritable.  Social History   Tobacco Use  Smoking Status Former Smoker  . Packs/day: 1.00  . Years: 35.00  . Pack years: 35.00  . Start date: 03/06/1974  . Last attempt to quit: 07/08/2015  . Years since quitting: 3.2  Smokeless Tobacco Current User  Tobacco Comment   vapor cigarettes, no nicotene     ROS see history of present illness  Objective  Physical Exam Vitals:   10/07/18 1510  BP: 120/64  Pulse: 70  Temp: 98.3 F (36.8 C)  SpO2: 96%    BP Readings from Last 3 Encounters:  10/07/18 120/64    09/26/18 112/67  09/16/18 120/79   Wt Readings from Last 3 Encounters:  10/07/18 162 lb 3.2 oz (73.6 kg)  09/26/18 160 lb (72.6 kg)  09/16/18 156 lb (70.8 kg)    Physical Exam  Constitutional: No distress.  HENT:  Head: Normocephalic and atraumatic.  Mouth/Throat: Oropharynx is clear and moist.  Eyes: Pupils are equal, round, and reactive to light. Conjunctivae are normal.  Cardiovascular: Normal rate, regular rhythm and normal heart sounds.  Pulmonary/Chest: Effort normal and breath sounds normal.  Abdominal: Soft. Bowel sounds are normal.  Mild upper abdominal tenderness, no guarding or rebound  Musculoskeletal: She exhibits no edema.  Neurological: She is alert.  Skin: Skin is warm and dry. She is not diaphoretic.     Assessment/Plan: Please see individual problem list.  Anxiety and depression I believe the patient's prior symptoms that necessitated emergency room evaluation were related to anxiety.  She had negative work-up for PE.  She also was ruled out for cardiac ischemic cause.  Her symptoms resolved with Ativan.  Discussed the option of adding additional medication for her anxiety with an SSRI or BuSpar.  She deferred these and opted to continue therapy and Ativan and see how she does.  If her symptoms recur she will be reevaluated.  Chronic abdominal pain Chronic issue.  She will continue management through  GI.  URI (upper respiratory infection) Symptoms consistent with viral URI.  Discussed supportive care.  She will trial Claritin.   No orders of the defined types were placed in this encounter.   Meds ordered this encounter  Medications  . loratadine (CLARITIN) 10 MG tablet    Sig: Take 1 tablet (10 mg total) by mouth daily.    Dispense:  30 tablet    Refill:  0     Tommi Rumps, MD [place]

## 2018-10-07 NOTE — Assessment & Plan Note (Signed)
I believe the patient's prior symptoms that necessitated emergency room evaluation were related to anxiety.  She had negative work-up for PE.  She also was ruled out for cardiac ischemic cause.  Her symptoms resolved with Ativan.  Discussed the option of adding additional medication for her anxiety with an SSRI or BuSpar.  She deferred these and opted to continue therapy and Ativan and see how she does.  If her symptoms recur she will be reevaluated.

## 2018-10-08 ENCOUNTER — Ambulatory Visit (INDEPENDENT_AMBULATORY_CARE_PROVIDER_SITE_OTHER): Payer: BLUE CROSS/BLUE SHIELD | Admitting: Psychology

## 2018-10-08 ENCOUNTER — Other Ambulatory Visit (INDEPENDENT_AMBULATORY_CARE_PROVIDER_SITE_OTHER): Payer: BLUE CROSS/BLUE SHIELD

## 2018-10-08 DIAGNOSIS — E785 Hyperlipidemia, unspecified: Secondary | ICD-10-CM

## 2018-10-08 DIAGNOSIS — F331 Major depressive disorder, recurrent, moderate: Secondary | ICD-10-CM | POA: Diagnosis not present

## 2018-10-08 DIAGNOSIS — E663 Overweight: Secondary | ICD-10-CM | POA: Diagnosis not present

## 2018-10-08 DIAGNOSIS — Z1329 Encounter for screening for other suspected endocrine disorder: Secondary | ICD-10-CM

## 2018-10-08 DIAGNOSIS — Z130 Encounter for screening for diseases of the blood and blood-forming organs and certain disorders involving the immune mechanism: Secondary | ICD-10-CM | POA: Diagnosis not present

## 2018-10-08 LAB — COMPREHENSIVE METABOLIC PANEL
ALBUMIN: 4.3 g/dL (ref 3.5–5.2)
ALK PHOS: 86 U/L (ref 39–117)
ALT: 31 U/L (ref 0–35)
AST: 26 U/L (ref 0–37)
BUN: 13 mg/dL (ref 6–23)
CO2: 31 mEq/L (ref 19–32)
Calcium: 9.5 mg/dL (ref 8.4–10.5)
Chloride: 103 mEq/L (ref 96–112)
Creatinine, Ser: 0.74 mg/dL (ref 0.40–1.20)
GFR: 86.13 mL/min (ref >60.00)
Glucose, Bld: 95 mg/dL (ref 70–99)
POTASSIUM: 4 meq/L (ref 3.5–5.1)
Sodium: 140 mEq/L (ref 135–145)
TOTAL PROTEIN: 7.3 g/dL (ref 6.0–8.3)
Total Bilirubin: 0.5 mg/dL (ref 0.2–1.2)

## 2018-10-08 LAB — CBC
HCT: 39.5 % (ref 36.0–46.0)
Hemoglobin: 13.3 g/dL (ref 12.0–15.0)
MCHC: 33.5 g/dL (ref 30.0–36.0)
MCV: 88.6 fl (ref 78.0–100.0)
Platelets: 366 10*3/uL (ref 150.0–400.0)
RBC: 4.46 Mil/uL (ref 3.87–5.11)
RDW: 14.1 % (ref 11.5–15.5)
WBC: 7.2 10*3/uL (ref 4.0–10.5)

## 2018-10-08 LAB — LIPID PANEL
CHOLESTEROL: 249 mg/dL — AB (ref 0–200)
HDL: 54.9 mg/dL (ref >39.00)
LDL Cholesterol: 165 mg/dL — ABNORMAL HIGH (ref 0–99)
NonHDL: 193.64
Total CHOL/HDL Ratio: 5
Triglycerides: 144 mg/dL (ref 0.0–149.0)
VLDL: 28.8 mg/dL (ref 0.0–40.0)

## 2018-10-08 LAB — TSH: TSH: 1.45 u[IU]/mL (ref 0.35–4.50)

## 2018-10-08 LAB — HEMOGLOBIN A1C: Hgb A1c MFr Bld: 5.7 % (ref 4.6–6.5)

## 2018-10-09 ENCOUNTER — Telehealth: Payer: Self-pay | Admitting: Pain Medicine

## 2018-10-09 NOTE — Telephone Encounter (Signed)
Patient called stating she has a pretty bad cold, sneezing, coughing, congestion. Should she have her RFA on Thurs 10-10-18 ? Please let her know.

## 2018-10-09 NOTE — Telephone Encounter (Signed)
Called patient. States she is not running a temperature.She taking OTC meds for her cold. Instructed her that I will talk with Dr Lowella Dandy and call her back.

## 2018-10-09 NOTE — Telephone Encounter (Signed)
Spoke w/patient, says she is no long coughing and sneezing, would like to do a facet block before christmas if she cant have RFA. Next available RFA appt is Jan 21 or 23 and she cant do that as she will start all over with her out of pocket on her insurance. Can she be scheduled for a facet ?

## 2018-10-09 NOTE — Telephone Encounter (Signed)
Spoke with Dr Dossie Arbour and he states it is ok for patient to have a facet block if she is not feeling sick at all.  I instructed patient the risks of decreased immune system when getting steroids.  Patient states she understands and is not feeling sick.  Please call and schedule a facet block for her  Prior to Christmas if that is available.

## 2018-10-09 NOTE — Telephone Encounter (Signed)
Talked with Dr Dossie Arbour and he states that she should reschedule her appt

## 2018-10-10 ENCOUNTER — Ambulatory Visit: Payer: BLUE CROSS/BLUE SHIELD | Admitting: Pain Medicine

## 2018-10-10 NOTE — Telephone Encounter (Signed)
Scheduled for 12/19

## 2018-10-11 ENCOUNTER — Encounter: Payer: Self-pay | Admitting: Family Medicine

## 2018-10-11 ENCOUNTER — Ambulatory Visit (INDEPENDENT_AMBULATORY_CARE_PROVIDER_SITE_OTHER): Payer: BLUE CROSS/BLUE SHIELD | Admitting: Family Medicine

## 2018-10-11 VITALS — BP 120/66 | HR 66 | Temp 97.9°F | Resp 16 | Ht 64.0 in | Wt 161.8 lb

## 2018-10-11 DIAGNOSIS — Z0000 Encounter for general adult medical examination without abnormal findings: Secondary | ICD-10-CM

## 2018-10-11 DIAGNOSIS — E559 Vitamin D deficiency, unspecified: Secondary | ICD-10-CM

## 2018-10-11 DIAGNOSIS — Z780 Asymptomatic menopausal state: Secondary | ICD-10-CM | POA: Diagnosis not present

## 2018-10-11 LAB — VITAMIN D 25 HYDROXY (VIT D DEFICIENCY, FRACTURES): VITD: 51.75 ng/mL (ref 30.00–100.00)

## 2018-10-11 NOTE — Progress Notes (Signed)
Nicole Rumps, MD Phone: [phone]  Nicole Walter is a 57 y.o. female who presents today for CPE.  Exercise: Not exercising currently though she does stay active.  Wants to start doing yoga. Diet: She tries to eat well.  She has cut out bread.  She was back on Neurontin which caused her weight to go up though she is now off of that. Mammogram is scheduled for 10/21/2018.  She has an appointment with her gynecologist for pelvic exam.  Last Pap smear was 10/18/2017 and was negative. Colonoscopy 02/22/2016 with 1 tubular adenoma and 1 hyperplastic polyp.  5-year recall. Up-to-date on tetanus vaccination and flu vaccination.  She will check with her insurance regarding Shingrix. Hepatitis C and HIV screening up-to-date. No family history of colon cancer, breast cancer, or ovarian cancer. She is postmenopausal and has not had any vaginal bleeding. She smoked 1 to 2 packs a day for 40 years.  She quit in 2014.  She has been using an e-cigarette with no nicotine for the last 3 to 4 years.  No alcohol use or illicit drug use.  She continues with yearly low-dose CT chest for lung cancer screening. She sees a Pharmacist, community.  Notes periodic sinus infections after getting her teeth cleaned.  She does see an ophthalmologist.   Reports having the beginnings of glaucoma.  She reports the symptoms that are positive in her review of systems are stable from our most recent visit and unchanged.  She notes no SI.  Active Ambulatory Problems    Diagnosis Date Noted  . Anxiety and depression 07/11/2015  . Chronic pain syndrome 07/11/2015  . Lumbar spondylosis 01/06/2016  . Combined fat and carbohydrate induced hyperlipemia 07/11/2015  . Routine general medical examination at a health care facility 01/14/2016  . Chronic neck pain (Primary Area of Pain) (Bilateral) (L>R) 01/28/2016  . Concentration deficit 01/28/2016  . Hyperlipidemia 02/18/2016  . Cervical spondylosis (Bilateral) 02/08/2016  . Fibromyalgia  02/08/2016  . Osteopenia 02/08/2016  . Skin lesions 04/27/2016  . Chronic hip pain (Bilateral) (R>L) 05/31/2016  . Bursitis of shoulder (Right) 05/17/2016  . Chronic Greater trochanteric bursitis (Bilateral) (L>R) 05/31/2016  . Long term current use of opiate analgesic 07/20/2016  . Long term prescription opiate use 07/20/2016  . Opiate use 07/20/2016  . Chronic upper back pain (Secondary Area of Pain) (Bilateral) (L>R) 07/20/2016  . Chronic shoulder pain [facility] Area of Pain) (Bilateral) (L>R) 07/20/2016  . Chronic knee pain (Right) 07/20/2016  . Chronic upper extremity pain (Left) 07/20/2016  . Chronic lower extremity pain (Bilateral) (L>R) 07/20/2016  . Chronic abdominal pain 07/20/2016  . Elevated sedimentation rate 08/31/2016  . Elevated C-reactive protein (CRP) 08/31/2016  . Overweight (BMI 25.0-29.9) 09/14/2016  . Greater trochanteric bursitis (Right) 09/18/2016  . Subacromial bursitis of shoulder joint (Right) 09/18/2016  . Osteoarthritis of hip (Bilateral) 11/20/2016  . Thoracic radiculitis (R>L) 12/21/2016  . Thoracic spondylosis 01/01/2017  . Shortness of breath 01/15/2017  . Osteoarthritis of shoulder (Right) 01/31/2017  . Osteoarthritis of knee (Right) 02/21/2017  . Chronic knee arthropathy (Right) 03/14/2017  . Chronic hip pain (Right) 03/19/2017  . Muscle spasm 05/15/2017  . Myofascial pain syndrome, cervical (trapezius) (Left) 05/28/2017  . Cervical facet syndrome (Bilateral) (L>R) 05/28/2017  . Constipation 07/16/2017  . Chronic low back pain (Bilateral) (L>R) 08/13/2017  . Cervical Foraminal Stenosis (Severe) (Bilateral: C-5-6) 08/13/2017  . Cervical spondylosis with radiculopathy (Bilateral) (L>R) 08/13/2017  . Cervical sensory radiculopathy at C5 (Bilateral) 08/13/2017  . Radicular pain of  shoulder (Bilateral) (C5) 08/13/2017  . Chronic pelvic pain in female 08/24/2017  . Fatty liver 08/24/2017  . Spondylosis without myelopathy or radiculopathy, cervical  region 02/11/2018  . Laceration of hand 02/12/2018  . Cervicalgia 03/28/2018  . Chronic shoulder pain (Left) 07/09/2018  . Cervical facet hypertrophy (Bilateral) 07/18/2018  . Cervical central spinal stenosis 07/18/2018  . Generalized anxiety disorder 07/23/2018  . Spondylosis without myelopathy or radiculopathy, lumbar region 08/29/2018  . Lumbar facet hypertrophy 08/29/2018  . Lumbar facet syndrome (Right) 08/29/2018  . DDD (degenerative disc disease), thoracic 08/29/2018  . Thoracic spondylosis with radiculopathy (Right) 08/29/2018  . Spinal enthesopathy of lumbar region ([place]) 08/29/2018  . Chronic midline low back pain without sciatica 08/29/2018  . Trypanophobia (Needle phobia) 09/26/2018  . URI (upper respiratory infection) 10/07/2018   Resolved Ambulatory Problems    Diagnosis Date Noted  . Generalized abdominal tenderness 01/14/2016  . Rash of back 01/28/2016  . Major depressive disorder, recurrent episode, moderate ([place]) 02/14/2016  . Abnormal liver enzymes 02/08/2016  . Chest pain 07/05/2016   Past Medical History:  Diagnosis Date  . Allergy   . Anxiety   . Chronic pain   . Depression   . DJD (degenerative joint disease)   . High cholesterol   . Weight loss due to medication     Family History  Problem Relation Age of Onset  . Heart disease Mother   . Stroke Mother   . Cancer Father        lung  . Arthritis Father   . Arthritis Paternal Grandmother   . Arthritis Paternal Grandfather   . Drug abuse Sister   . Drug abuse Brother   . Post-traumatic stress disorder Brother   . Diabetes Sister   . Dementia Sister   . Drug abuse Brother   . Cancer Brother     Social History   Socioeconomic History  . Marital status: Married    Spouse name: Not on file  . Number of children: Not on file  . Years of education: Not on file  . Highest education level: Not on file  Occupational History  . Not on file  Social Needs  . Financial resource strain: Not on  file  . Food insecurity:    Worry: Not on file    Inability: Not on file  . Transportation needs:    Medical: Not on file    Non-medical: Not on file  Tobacco Use  . Smoking status: Former Smoker    Packs/day: 1.00    Years: 35.00    Pack years: 35.00    Start date: 03/06/1974    Last attempt to quit: 07/08/2015    Years since quitting: 3.2  . Smokeless tobacco: Current User  . Tobacco comment: vapor cigarettes, no nicotene  Substance and Sexual Activity  . Alcohol use: No    Alcohol/week: 0.0 standard drinks  . Drug use: No  . Sexual activity: Yes    Birth control/protection: None  Lifestyle  . Physical activity:    Days per week: Not on file    Minutes per session: Not on file  . Stress: Not on file  Relationships  . Social connections:    Talks on phone: Not on file    Gets together: Not on file    Attends religious service: Not on file    Active member of club or organization: Not on file    Attends meetings of clubs or organizations: Not on file  Relationship status: Not on file  . Intimate partner violence:    Fear of current or ex partner: Not on file    Emotionally abused: Not on file    Physically abused: Not on file    Forced sexual activity: Not on file  Other Topics Concern  . Not on file  Social History Narrative  . Not on file    ROS  General:  Negative for nexplained weight loss, fever Skin: Negative for new or changing mole, sore that won't heal HEENT: Negative for trouble hearing, trouble seeing, ringing in ears, mouth sores, hoarseness, change in voice, dysphagia. CV:  Negative for chest pain, dyspnea, edema, palpitations Resp: Negative for cough, dyspnea, hemoptysis GI: Positive for constipation, abdominal pain, negative for nausea, vomiting, diarrhea, melena, hematochezia. GU: Negative for dysuria, incontinence, urinary hesitance, hematuria, vaginal or penile discharge, polyuria, sexual difficulty, lumps in testicle or breasts MSK: Positive  for muscle cramps or aches, joint pain or swelling Neuro: Negative for headaches, weakness, numbness, dizziness, passing out/fainting Psych: Positive for depression, anxiety, memory problems  Objective  Physical Exam Vitals:   10/11/18 1356  BP: 120/66  Pulse: 66  Resp: 16  Temp: 97.9 F (36.6 C)  SpO2: 97%    BP Readings from Last 3 Encounters:  10/11/18 120/66  10/07/18 120/64  09/26/18 112/67   Wt Readings from Last 3 Encounters:  10/11/18 161 lb 12.8 oz (73.4 kg)  10/07/18 162 lb 3.2 oz (73.6 kg)  09/26/18 160 lb (72.6 kg)    Physical Exam  Constitutional: No distress.  HENT:  Head: Normocephalic and atraumatic.  Mouth/Throat: Oropharynx is clear and moist.  Eyes: Pupils are equal, round, and reactive to light. Conjunctivae are normal.  Neck: Neck supple.  Cardiovascular: Normal rate, regular rhythm and normal heart sounds.  Pulmonary/Chest: Effort normal and breath sounds normal.  Abdominal: Soft. Bowel sounds are normal. She exhibits no distension. There is no tenderness. There is no rebound and no guarding.  Musculoskeletal: She exhibits no edema.  Lymphadenopathy:    She has no cervical adenopathy.  Neurological: She is alert.  Skin: Skin is warm and dry. She is not diaphoretic.     Assessment/Plan:   Routine general medical examination at a health care facility Physical exam completed.  Breast exam and pelvic exam deferred to gynecology.  She will keep her appointment for her mammogram.  She will work on diet and exercise.  Prior lab work reviewed.  Vitamin D ordered to be checked.  DEXA scan ordered as well.   Orders Placed This Encounter  Procedures  . DG Bone Density    Standing Status:   Future    Standing Expiration Date:   12/13/2019    Order Specific Question:   Reason for Exam (SYMPTOM  OR DIAGNOSIS REQUIRED)    Answer:   postmenopausal estrogen deficiency    Order Specific Question:   Is the patient pregnant?    Answer:   No    Order  Specific Question:   Preferred imaging location?    Answer:   [facility]  . Vitamin D (25 hydroxy)    No orders of the defined types were placed in this encounter.    Nicole Rumps, MD [place]

## 2018-10-11 NOTE — Patient Instructions (Signed)
Nice to see you. Please work on diet and exercise. We will get your vitamin D checked and get you set up for a DEXA scan.

## 2018-10-11 NOTE — Assessment & Plan Note (Signed)
Physical exam completed.  Breast exam and pelvic exam deferred to gynecology.  She will keep her appointment for her mammogram.  She will work on diet and exercise.  Prior lab work reviewed.  Vitamin D ordered to be checked.  DEXA scan ordered as well.

## 2018-10-17 ENCOUNTER — Other Ambulatory Visit: Payer: Self-pay

## 2018-10-17 ENCOUNTER — Ambulatory Visit: Payer: BLUE CROSS/BLUE SHIELD | Attending: Gastroenterology | Admitting: Physical Therapy

## 2018-10-17 ENCOUNTER — Encounter: Payer: Self-pay | Admitting: Physical Therapy

## 2018-10-17 DIAGNOSIS — M6208 Separation of muscle (nontraumatic), other site: Secondary | ICD-10-CM

## 2018-10-17 DIAGNOSIS — R262 Difficulty in walking, not elsewhere classified: Secondary | ICD-10-CM | POA: Diagnosis present

## 2018-10-17 DIAGNOSIS — M62838 Other muscle spasm: Secondary | ICD-10-CM | POA: Insufficient documentation

## 2018-10-17 DIAGNOSIS — G8929 Other chronic pain: Secondary | ICD-10-CM | POA: Insufficient documentation

## 2018-10-17 DIAGNOSIS — M545 Low back pain: Secondary | ICD-10-CM | POA: Diagnosis present

## 2018-10-17 DIAGNOSIS — R278 Other lack of coordination: Secondary | ICD-10-CM | POA: Insufficient documentation

## 2018-10-17 NOTE — Patient Instructions (Signed)
  Avoid straining pelvic floor, abdominal muscles , spine  Use log rolling technique instead of getting out of bed with your neck or the sit-up   Log rolling into and out of .bed  With sidelying position first with arm over head first to roll oonto armpit   ______  Knee rocking 30 deg side to side, genty pressure at the middle belly to release scars  10 reps    _____ Deep core level 1 ( handout)

## 2018-10-18 ENCOUNTER — Telehealth: Payer: Self-pay

## 2018-10-18 NOTE — Therapy (Signed)
[place] MAIN [facility] SERVICES [address] [place], [place], [address] Phone: [phone]   Fax:  [phone]  Physical Therapy Evaluation  Patient Details  Name: Nicole Walter MRN: [id number] Date of Birth: 06-20-1961 No data recorded  Encounter Date: 10/17/2018  PT End of Session - 10/18/18 1644    Visit Number  1    Number of Visits  12    Date for PT Re-Evaluation  01/10/19    Authorization Type  blue cross blue shield    Activity Tolerance  Patient tolerated treatment well;No increased pain       Past Medical History:  Diagnosis Date  . Abnormal liver enzymes 02/08/2016  . Allergy   . Anxiety   . Chest pain 07/05/2016  . Chronic pain   . Depression   . DJD (degenerative joint disease)    lumbar  . Fibromyalgia   . High cholesterol   . Rash of back 01/28/2016  . Weight loss due to medication     Past Surgical History:  Procedure Laterality Date  . ANKLE SURGERY     x 2  . BREAST BIOPSY Left 1997  . CHOLECYSTECTOMY    . COLONOSCOPY    . COLONOSCOPY WITH PROPOFOL N/A 02/22/2016   Procedure: COLONOSCOPY WITH PROPOFOL;  Surgeon: Lollie Sails, MD;  [facility] ENDOSCOPY;  Service: Endoscopy;  Laterality: N/A;  . ESOPHAGOGASTRODUODENOSCOPY (EGD) WITH PROPOFOL N/A 02/22/2016   Procedure: ESOPHAGOGASTRODUODENOSCOPY (EGD) WITH PROPOFOL;  Surgeon: Lollie Sails, MD;  Location: [facility] ENDOSCOPY;  Service: Endoscopy;  Laterality: N/A;  . NASAL SINUS SURGERY    . TONSILLECTOMY    . TUBAL LIGATION      There were no vitals filed for this visit.   Subjective Assessment - 10/17/18 1317    Subjective 1) Abdominal pain started 3 months ago when she noticed a stomach pain where she had the tubal ligation 1987. Currently, 2/10.  In 2010, pt had a gall bladder removed at the same location.  The pain radiated to the R.  Denied diarrhea, blood in stool , nor changes to stools.  Bowel movements occur daily with medications.  Pt has  had bloating/ gasey but she is not is sure if it has to do with Fish oil or Bentyl.  Pain occurs after eating and drink, laying down, turning in bed.  "It feels like pull".  Pt is not doing any repetitive lifting, bending, sit up and crunches.  Pt had lost 20 lbs but gained it back.  No physical activity routine , no walking.      2) Urinary incontinence started with the abdominal pain 3 months. When pt feels she has to go, she get dribbles before making it to the bathroom.  Leakage occurs with coughing, sneezing, laughing.  Wears on a pad.  Nocturia: 1x / night which is new for the past 3 months. Pt used to not have to get up in the middle of the night.  Daily fluid intake: 4- 16 fl oz water, 1 cups of coffee and tea, no soda. 1 x trip to bathroom every 2 hours.     Pertinent History  OB/ GYN: 2 vaginal deliveries with forcep and vaccuum procedures with episiotomy.  Surgurical Hx : tubal ligation and gall bladder removal.   Pt take opiates for fibromyalgia and OA. Pt has has radiofrequency for myofascial pain for shoulder and neck and is due for  steroid injection shot on the 10/24/18.  Pt has been  depressed with the loss of a family member. Pt has not felt motivated to go to the [facility].   Manageable LBP.  Depression/ Anxiety.          Patient Stated Goals  help with pelvic floor floor          [facility] PT Assessment - 10/17/18 1345      Coordination   Gross Motor Movements are Fluid and Coordinated  --   abdominal straining with cue for bowel movements   Fine Motor Movements are Fluid and Coordinated  --   slight cranial movement of pelvic floor w/ cue,      Single Leg Stance   Comments  stability present B       Strength   Overall Strength Comments  BLE seated Gross 5/5    hip abd B 3+/5, hip ext 4-/5       Palpation   SI assessment   L PSIS more posterior. L SIJ limited in nutation       Palpation comment  wincing tenderness at medial segment of abdomen ( no pain post Tx) . non-soft over  all quadrants of ab, scar restriction at umbilicus, more tenderness ait fascial pulling towards L                  Objective measurements completed on examination: See above findings.    Pelvic Floor Special Questions - 10/17/18 1411    Diastasis Recti  3 fingers along linea alba ( post Tx: 1 fingers width)        [facility] Adult PT Treatment/Exercise - 10/17/18 1417      Neuro Re-ed    Neuro Re-ed Details   see pt instructions      Manual Therapy   Manual therapy comments  fascial releases over abdomen to faciliate scar releases and closure of rectus abdominis                   PT Long Term Goals - 10/18/18 1645      PT LONG TERM GOAL #1   Title  Pt will demo decreased abdomianl separation from 3 fingers width to < 1 fingers width in order to improve intraabdominal pressure system for optimal pelvic floor functions     Time  4    Period  Weeks    Status  New    [place] Date  01/10/19      PT LONG TERM GOAL #2   Title  Pt will demo proper body mechanics with functional activities to minimize straining pelvic floor, abdominal mm, and spine       Time  2    Period  Weeks    Status  New    Target Date  11/01/18      PT LONG TERM GOAL #3   Title  Pt will demo proper coordination with pelvic floor/ deep core in order to minimzie leakage and worsening of diastasis recti    Time  10    Period  Weeks    Status  New             Plan - 10/18/18 1644    Clinical Impression Statement  Pt is a 57 yo female who reports urinary leakage and abdominal pain which started 3 months ago. These deficits impact her ADLs. Pt 's clinical presentations include tenderness with palpation over abdomen, pelvic obliquities/ malalignment and hypomobility, diastasis recti, poor body mechanics which places strain onto her abdominal mm, dyscoordination of pelvic floor  mm, and hip weakness. Pt also has Hx of OB/ GYN: 2 vaginal deliveries with forcep and vaccuum procedures with  episiotomy.  Surgurical Hx : tubal ligation and gall bladder removal. Pt was tearful during session expressing grief over the loss of a family member which set her into a depressed state and loss of motivation to go to the gym. Pt demo'd improved pelvic alignment and mobility and improved fascial mobility and less pain over abdomen  post Tx.    Clinical Presentation  Evolving    Clinical Decision Making  Moderate    Rehab Potential  Good    Clinical Impairments Affecting Rehab Potential  chronic pain    PT Frequency  1x / week    PT Duration  12 weeks   16 weeks   PT Treatment/Interventions  Aquatic Therapy;Electrical Stimulation;Moist Heat;Therapeutic activities;Gait training;Therapeutic exercise;Manual techniques;Neuromuscular re-education;Patient/family education;Energy conservation;Taping    Consulted and Agree with Plan of Care  Patient       Patient will benefit from skilled therapeutic intervention in order to improve the following deficits and impairments:  Decreased mobility, Difficulty walking, Pain, Decreased strength, Hypomobility, Postural dysfunction, Decreased coordination  Visit Diagnosis: Other muscle spasm  Diastasis recti  Other lack of coordination     Problem List Patient Active Problem List   Diagnosis Date Noted  . Trypanophobia (Needle phobia) 09/26/2018  . Spondylosis without myelopathy or radiculopathy, lumbar region 08/29/2018  . Lumbar facet hypertrophy 08/29/2018  . Lumbar facet syndrome (Right) 08/29/2018  . DDD (degenerative disc disease), thoracic 08/29/2018  . Thoracic spondylosis with radiculopathy (Right) 08/29/2018  . Spinal enthesopathy of lumbar region ([place]) 08/29/2018  . Chronic midline low back pain without sciatica 08/29/2018  . Generalized anxiety disorder 07/23/2018  . Cervical facet hypertrophy (Bilateral) 07/18/2018  . Cervical central spinal stenosis 07/18/2018  . Chronic shoulder pain (Left) 07/09/2018  . Cervicalgia 03/28/2018   . Spondylosis without myelopathy or radiculopathy, cervical region 02/11/2018  . Chronic pelvic pain in female 08/24/2017  . Fatty liver 08/24/2017  . Chronic low back pain (Bilateral) (L>R) 08/13/2017  . Cervical Foraminal Stenosis (Severe) (Bilateral: C-5-6) 08/13/2017  . Cervical spondylosis with radiculopathy (Bilateral) (L>R) 08/13/2017  . Cervical sensory radiculopathy at C5 (Bilateral) 08/13/2017  . Radicular pain of shoulder (Bilateral) (C5) 08/13/2017  . Constipation 07/16/2017  . Myofascial pain syndrome, cervical (trapezius) (Left) 05/28/2017  . Cervical facet syndrome (Bilateral) (L>R) 05/28/2017  . Muscle spasm 05/15/2017  . Chronic hip pain (Right) 03/19/2017  . Chronic knee arthropathy (Right) 03/14/2017  . Osteoarthritis of knee (Right) 02/21/2017  . Osteoarthritis of shoulder (Right) 01/31/2017  . Shortness of breath 01/15/2017  . Thoracic spondylosis 01/01/2017  . Thoracic radiculitis (R>L) 12/21/2016  . Osteoarthritis of hip (Bilateral) 11/20/2016  . Greater trochanteric bursitis (Right) 09/18/2016  . Subacromial bursitis of shoulder joint (Right) 09/18/2016  . Overweight (BMI 25.0-29.9) 09/14/2016  . Elevated sedimentation rate 08/31/2016  . Elevated C-reactive protein (CRP) 08/31/2016  . Long term current use of opiate analgesic 07/20/2016  . Long term prescription opiate use 07/20/2016  . Opiate use 07/20/2016  . Chronic upper back pain (Secondary Area of Pain) (Bilateral) (L>R) 07/20/2016  . Chronic shoulder pain [facility] Area of Pain) (Bilateral) (L>R) 07/20/2016  . Chronic knee pain (Right) 07/20/2016  . Chronic upper extremity pain (Left) 07/20/2016  . Chronic lower extremity pain (Bilateral) (L>R) 07/20/2016  . Chronic abdominal pain 07/20/2016  . Chronic hip pain (Bilateral) (R>L) 05/31/2016  . Chronic Greater trochanteric bursitis (Bilateral) (L>R) 05/31/2016  .  Bursitis of shoulder (Right) 05/17/2016  . Skin lesions 04/27/2016  . Hyperlipidemia  02/18/2016  . Cervical spondylosis (Bilateral) 02/08/2016  . Fibromyalgia 02/08/2016  . Osteopenia 02/08/2016  . Chronic neck pain (Primary Area of Pain) (Bilateral) (L>R) 01/28/2016  . Concentration deficit 01/28/2016  . Routine general medical examination at a health care facility 01/14/2016  . Lumbar spondylosis 01/06/2016  . Anxiety and depression 07/11/2015  . Chronic pain syndrome 07/11/2015  . Combined fat and carbohydrate induced hyperlipemia 07/11/2015    Jerl Mina 10/18/2018, 4:55 PM  [place] MAIN [facility] SERVICES [address] [place], [place], [address] Phone: [phone]   Fax:  [phone]  Name: Nicole Walter MRN: [id number] Date of Birth: 1961-09-13

## 2018-10-18 NOTE — Telephone Encounter (Signed)
I spoke with patient & she stated that she would just wait to be seen by her GYN. FYI.

## 2018-10-18 NOTE — Telephone Encounter (Signed)
Copied from [place] [id number]. Topic: General - Other >> Oct 18, 2018  8:04 AM Judyann Munson wrote: Reason for CRM: patient is calling to request a nurse gave her a call back in regards to labs results that were taken on 10-11-18. Please advise

## 2018-10-18 NOTE — Telephone Encounter (Signed)
She will need to have her urine rechecked.  This should be with an office visit with [place] though she could discuss it with her gynecologist next week if she would prefer not to have an office visit with [place].

## 2018-10-18 NOTE — Telephone Encounter (Signed)
The patient is calling to request a call back, she would like to be schedule to come In on Monday 10-21-18 with Dr.Sonnenberg for a OV but is stated she needs the appt to be schedule around her other appt. Please advise

## 2018-10-21 ENCOUNTER — Telehealth: Payer: Self-pay | Admitting: Pain Medicine

## 2018-10-21 ENCOUNTER — Ambulatory Visit
Admission: RE | Admit: 2018-10-21 | Discharge: 2018-10-21 | Disposition: A | Discharge status: Home / Self Care | Payer: BLUE CROSS/BLUE SHIELD | Source: Ambulatory Visit | Attending: Family Medicine | Admitting: Family Medicine

## 2018-10-21 ENCOUNTER — Ambulatory Visit: Payer: BLUE CROSS/BLUE SHIELD | Admitting: Physical Therapy

## 2018-10-21 DIAGNOSIS — Z1231 Encounter for screening mammogram for malignant neoplasm of breast: Secondary | ICD-10-CM

## 2018-10-21 DIAGNOSIS — M62838 Other muscle spasm: Secondary | ICD-10-CM | POA: Diagnosis not present

## 2018-10-21 DIAGNOSIS — G8929 Other chronic pain: Secondary | ICD-10-CM

## 2018-10-21 DIAGNOSIS — R262 Difficulty in walking, not elsewhere classified: Secondary | ICD-10-CM

## 2018-10-21 DIAGNOSIS — M6208 Separation of muscle (nontraumatic), other site: Secondary | ICD-10-CM

## 2018-10-21 DIAGNOSIS — M545 Low back pain, unspecified: Secondary | ICD-10-CM

## 2018-10-21 DIAGNOSIS — R278 Other lack of coordination: Secondary | ICD-10-CM

## 2018-10-21 NOTE — Telephone Encounter (Signed)
Patient would like to know if she can get her shingles shot at the pharmacy, she has a procedure appt on thurs. 10-24-18

## 2018-10-21 NOTE — Telephone Encounter (Signed)
Spoke with Nicole Walter's husband and told him that No, she should not take the shingles vaccine within 2 weeks of a procedure or 2 weeks post procedure.  Husband verbalizes u/o information and will let her know.

## 2018-10-21 NOTE — Therapy (Signed)
[place] MAIN [facility] SERVICES [address] [place], [place], [address] Phone: [phone]   Fax:  [phone]  Physical Therapy Treatment  Patient Details  Name: Nicole Walter MRN: [id number] Date of Birth: 1961/01/19 No data recorded  Encounter Date: 10/21/2018  PT End of Session - 10/21/18 1538    Visit Number  2    Number of Visits  12    Date for PT Re-Evaluation  01/10/19    Authorization Type  blue cross blue shield    PT Start Time  1510    PT Stop Time  1600    PT Time Calculation (min)  50 min    Activity Tolerance  Patient tolerated treatment well;No increased pain       Past Medical History:  Diagnosis Date  . Abnormal liver enzymes 02/08/2016  . Allergy   . Anxiety   . Chest pain 07/05/2016  . Chronic pain   . Depression   . DJD (degenerative joint disease)    lumbar  . Fibromyalgia   . High cholesterol   . Rash of back 01/28/2016  . Weight loss due to medication     Past Surgical History:  Procedure Laterality Date  . ANKLE SURGERY     x 2  . BREAST BIOPSY Left 1997  . CHOLECYSTECTOMY    . COLONOSCOPY    . COLONOSCOPY WITH PROPOFOL N/A 02/22/2016   Procedure: COLONOSCOPY WITH PROPOFOL;  Surgeon: Lollie Sails, MD;  Location: [facility] ENDOSCOPY;  Service: Endoscopy;  Laterality: N/A;  . ESOPHAGOGASTRODUODENOSCOPY (EGD) WITH PROPOFOL N/A 02/22/2016   Procedure: ESOPHAGOGASTRODUODENOSCOPY (EGD) WITH PROPOFOL;  Surgeon: Lollie Sails, MD;  Location: [facility] ENDOSCOPY;  Service: Endoscopy;  Laterality: N/A;  . NASAL SINUS SURGERY    . TONSILLECTOMY    . TUBAL LIGATION      There were no vitals filed for this visit.  Subjective Assessment - 10/21/18 1559    Subjective  Pt reports she has been working on not lifting her neck when moving around in bed. Pt felt sore after last session but the soreness went away.     Pertinent History  OB/ GYN: 2 vaginal deliveries with forcep and vaccuum procedures with episiotomy.   Surgurical Hx : tubal ligation and gall bladder removal.   Pt take opiates for fibromyalgia and OA. Pt has has radiofrequency for myofascial pain for shoulder and neck and is due for  steroid injection shot on the 10/24/18.  Pt has been depressed with the loss of a family member. Pt has not felt motivated to go to the [facility].   Manageable LBP.  Depression/ Anxiety.          Patient Stated Goals  help with pelvic floor floor          [facility] PT Assessment - 10/21/18 1539      Palpation   Spinal mobility  thypomobility at throacic segments, interspinal mm L > R     SI assessment   PSIS more levelled     Palpation comment  less tenderness w/palption over abdomen compared to last session                Pelvic Floor Special Questions - 10/21/18 1539    Diastasis Recti  52fingers width apart ( 1 fingers with post Tx)         [facility] Adult PT Treatment/Exercise - 10/21/18 1540      Neuro Re-ed    Neuro Re-ed Details  see pt instructions      Moist Heat Therapy   Number Minutes Moist Heat  10 Minutes    Moist Heat Location  Other (comment)   thoracic and abdomen      Manual Therapy   Manual therapy comments  MWM Grade II mob at thoracic in sidelying B , quadriped position with shoulder flexion/ trunk rotation w/ abdominal fascial pulling    supine/hooklying:  fascial releases over abdomen                 PT Long Term Goals - 10/18/18 1645      PT LONG TERM GOAL #1   Title  Pt will demo decreased abdomianl separation from 3 fingers width to < 1 fingers width in order to improve intraabdominal pressure system for optimal pelvic floor functions     Time  4    Period  Weeks    Status  New    Target Date  01/10/19      PT LONG TERM GOAL #2   Title  Pt will demo proper body mechanics with functional activities to minimize straining pelvic floor, abdominal mm, and spine       Time  2    Period  Weeks    Status  New    Target Date  11/01/18      PT LONG TERM GOAL #3    Title  Pt will demo proper coordination with pelvic floor/ deep core in order to minimzie leakage and worsening of diastasis recti    Time  10    Period  Weeks    Status  New            Plan - 10/21/18 1538    Clinical Impression Statement  Pt demo'd decreased fascial scar restrictions over abdomen compared to previous session. Provided manual Tx to address thoracic hypomobility and diastasis recti which pt tolerated without increased pain. Provided relaxation training and deep core stregnthening. Diastasis recti is improving. Pt is compliant with not lifting neck up when moving in bed which will continues to help minimzie worsening of abdominal separation. Pt continues to benefit from skilled PT.      Rehab Potential  Good    Clinical Impairments Affecting Rehab Potential  chronic pain    PT Frequency  1x / week    PT Duration  12 weeks   16 weeks   PT Treatment/Interventions  Aquatic Therapy;Electrical Stimulation;Moist Heat;Therapeutic activities;Gait training;Therapeutic exercise;Manual techniques;Neuromuscular re-education;Patient/family education;Energy conservation;Taping    Consulted and Agree with Plan of Care  Patient       Patient will benefit from skilled therapeutic intervention in order to improve the following deficits and impairments:  Decreased mobility, Difficulty walking, Pain, Decreased strength, Hypomobility, Postural dysfunction, Decreased coordination  Visit Diagnosis: Other muscle spasm  Diastasis recti  Other lack of coordination  Difficulty in walking, not elsewhere classified  Chronic left-sided low back pain without sciatica  Left-sided low back pain without sciatica, unspecified chronicity     Problem List Patient Active Problem List   Diagnosis Date Noted  . Trypanophobia (Needle phobia) 09/26/2018  . Spondylosis without myelopathy or radiculopathy, lumbar region 08/29/2018  . Lumbar facet hypertrophy 08/29/2018  . Lumbar facet syndrome  (Right) 08/29/2018  . DDD (degenerative disc disease), thoracic 08/29/2018  . Thoracic spondylosis with radiculopathy (Right) 08/29/2018  . Spinal enthesopathy of lumbar region ([place]) 08/29/2018  . Chronic midline low back pain without sciatica 08/29/2018  . Generalized anxiety disorder 07/23/2018  .  Cervical facet hypertrophy (Bilateral) 07/18/2018  . Cervical central spinal stenosis 07/18/2018  . Chronic shoulder pain (Left) 07/09/2018  . Cervicalgia 03/28/2018  . Spondylosis without myelopathy or radiculopathy, cervical region 02/11/2018  . Chronic pelvic pain in female 08/24/2017  . Fatty liver 08/24/2017  . Chronic low back pain (Bilateral) (L>R) 08/13/2017  . Cervical Foraminal Stenosis (Severe) (Bilateral: C-5-6) 08/13/2017  . Cervical spondylosis with radiculopathy (Bilateral) (L>R) 08/13/2017  . Cervical sensory radiculopathy at C5 (Bilateral) 08/13/2017  . Radicular pain of shoulder (Bilateral) (C5) 08/13/2017  . Constipation 07/16/2017  . Myofascial pain syndrome, cervical (trapezius) (Left) 05/28/2017  . Cervical facet syndrome (Bilateral) (L>R) 05/28/2017  . Muscle spasm 05/15/2017  . Chronic hip pain (Right) 03/19/2017  . Chronic knee arthropathy (Right) 03/14/2017  . Osteoarthritis of knee (Right) 02/21/2017  . Osteoarthritis of shoulder (Right) 01/31/2017  . Shortness of breath 01/15/2017  . Thoracic spondylosis 01/01/2017  . Thoracic radiculitis (R>L) 12/21/2016  . Osteoarthritis of hip (Bilateral) 11/20/2016  . Greater trochanteric bursitis (Right) 09/18/2016  . Subacromial bursitis of shoulder joint (Right) 09/18/2016  . Overweight (BMI 25.0-29.9) 09/14/2016  . Elevated sedimentation rate 08/31/2016  . Elevated C-reactive protein (CRP) 08/31/2016  . Long term current use of opiate analgesic 07/20/2016  . Long term prescription opiate use 07/20/2016  . Opiate use 07/20/2016  . Chronic upper back pain (Secondary Area of Pain) (Bilateral) (L>R) 07/20/2016  .  Chronic shoulder pain [facility] Area of Pain) (Bilateral) (L>R) 07/20/2016  . Chronic knee pain (Right) 07/20/2016  . Chronic upper extremity pain (Left) 07/20/2016  . Chronic lower extremity pain (Bilateral) (L>R) 07/20/2016  . Chronic abdominal pain 07/20/2016  . Chronic hip pain (Bilateral) (R>L) 05/31/2016  . Chronic Greater trochanteric bursitis (Bilateral) (L>R) 05/31/2016  . Bursitis of shoulder (Right) 05/17/2016  . Skin lesions 04/27/2016  . Hyperlipidemia 02/18/2016  . Cervical spondylosis (Bilateral) 02/08/2016  . Fibromyalgia 02/08/2016  . Osteopenia 02/08/2016  . Chronic neck pain (Primary Area of Pain) (Bilateral) (L>R) 01/28/2016  . Concentration deficit 01/28/2016  . Routine general medical examination at a health care facility 01/14/2016  . Lumbar spondylosis 01/06/2016  . Anxiety and depression 07/11/2015  . Chronic pain syndrome 07/11/2015  . Combined fat and carbohydrate induced hyperlipemia 07/11/2015    Jerl Mina ,PT, DPT, E-RYT  10/21/2018, 4:01 PM  [place] MAIN [facility] SERVICES [address] [place], [place], [address] Phone: [phone]   Fax:  [phone]  Name: Caira Poche MRN: [id number] Date of Birth: 1961/02/08

## 2018-10-22 ENCOUNTER — Encounter: Payer: Self-pay | Admitting: Obstetrics and Gynecology

## 2018-10-22 ENCOUNTER — Ambulatory Visit (INDEPENDENT_AMBULATORY_CARE_PROVIDER_SITE_OTHER): Payer: BLUE CROSS/BLUE SHIELD | Admitting: Obstetrics and Gynecology

## 2018-10-22 ENCOUNTER — Other Ambulatory Visit (HOSPITAL_COMMUNITY)
Admission: RE | Admit: 2018-10-22 | Discharge: 2018-10-22 | Disposition: A | Discharge status: Home / Self Care | Payer: BLUE CROSS/BLUE SHIELD | Source: Ambulatory Visit | Attending: Obstetrics and Gynecology | Admitting: Obstetrics and Gynecology

## 2018-10-22 VITALS — BP 108/74 | HR 85 | Ht 64.0 in | Wt 164.2 lb

## 2018-10-22 DIAGNOSIS — Z0000 Encounter for general adult medical examination without abnormal findings: Secondary | ICD-10-CM | POA: Insufficient documentation

## 2018-10-22 DIAGNOSIS — M545 Low back pain, unspecified: Secondary | ICD-10-CM

## 2018-10-22 DIAGNOSIS — B977 Papillomavirus as the cause of diseases classified elsewhere: Secondary | ICD-10-CM | POA: Diagnosis present

## 2018-10-22 DIAGNOSIS — N72 Inflammatory disease of cervix uteri: Secondary | ICD-10-CM | POA: Insufficient documentation

## 2018-10-22 DIAGNOSIS — R350 Frequency of micturition: Secondary | ICD-10-CM | POA: Diagnosis not present

## 2018-10-22 LAB — POCT URINALYSIS DIPSTICK
Bilirubin, UA: NEGATIVE
Blood, UA: NEGATIVE
Glucose, UA: NEGATIVE
Ketones, UA: NEGATIVE
Nitrite, UA: NEGATIVE
ODOR: NEGATIVE
Protein, UA: NEGATIVE
Spec Grav, UA: 1.01 (ref 1.010–1.025)
Urobilinogen, UA: 0.2 E.U./dL
pH, UA: 7.5 (ref 5.0–8.0)

## 2018-10-22 NOTE — Addendum Note (Signed)
Addended by: Elouise Munroe on: 10/22/2018 11:29 AM   Modules accepted: Orders

## 2018-10-22 NOTE — Progress Notes (Signed)
HPI:      [name] is a 57 y.o. [phone] who LMP was No LMP recorded. Patient is postmenopausal.  Subjective:   She presents today for her annual examination.  She reports that she is doing better than last year.  Her upper abdominal pain and some of her lower abdominal pain has been resolved.  She reports that she occasionally leaks urine more than she did before.  She says that she had an emergency department visit where she had a UTI but they "forgot" to treat her.  She would like to be tested today. She has a history of HPV and abnormal Pap smears but her last was normal.  She is requesting one this year again to "make sure". She is up-to-date on her mammograms-she had one yesterday.     Hx: The following portions of the patient's history were reviewed and updated as appropriate:             She  has a past medical history of Abnormal liver enzymes (02/08/2016), Allergy, Anxiety, Chest pain (07/05/2016), Chronic pain, Depression, DJD (degenerative joint disease), Fibromyalgia, High cholesterol, Rash of back (01/28/2016), and Weight loss due to medication. She does not have any pertinent problems on file. She  has a past surgical history that includes Cholecystectomy; Tubal ligation; Ankle surgery; Nasal sinus surgery; Tonsillectomy; Colonoscopy; Esophagogastroduodenoscopy (egd) with propofol (N/A, 02/22/2016); Colonoscopy with propofol (N/A, 02/22/2016); and Breast biopsy (Left, 1997). Her family history includes Arthritis in her father, paternal grandfather, and paternal grandmother; Cancer in her brother and father; Dementia in her sister; Diabetes in her sister; Drug abuse in her brother, brother, and sister; Heart disease in her mother; Post-traumatic stress disorder in her brother; Stroke in her mother. She  reports that she has been smoking e-cigarettes. She started smoking about 44 years ago. She has a 35.00 pack-year smoking history. She uses smokeless tobacco. She reports that she  does not drink alcohol or use drugs. She has a current medication list which includes the following prescription(s): cholecalciferol, dicyclomine, fluocinonide cream, fluticasone, linzess, loratadine, lorazepam, multi-vitamins, naloxone, fish oil, oxycodone, oxycodone, rabeprazole, sucralfate, trintellix, and oxycodone. She is allergic to other; food; and pea.       Review of Systems:  Review of Systems  Constitutional: Denied constitutional symptoms, night sweats, recent illness, fatigue, fever, insomnia and weight loss.  Eyes: Denied eye symptoms, eye pain, photophobia, vision change and visual disturbance.  Ears/Nose/Throat/Neck: Denied ear, nose, throat or neck symptoms, hearing loss, nasal discharge, sinus congestion and sore throat.  Cardiovascular: Denied cardiovascular symptoms, arrhythmia, chest pain/pressure, edema, exercise intolerance, orthopnea and palpitations.  Respiratory: Denied pulmonary symptoms, asthma, pleuritic pain, productive sputum, cough, dyspnea and wheezing.  Gastrointestinal: Denied, gastro-esophageal reflux, melena, nausea and vomiting.  Genitourinary: Denied genitourinary symptoms including symptomatic vaginal discharge, pelvic relaxation issues, and urinary complaints.  Musculoskeletal: Denied musculoskeletal symptoms, stiffness, swelling, muscle weakness and myalgia.  Dermatologic: Denied dermatology symptoms, rash and scar.  Neurologic: Denied neurology symptoms, dizziness, headache, neck pain and syncope.  Psychiatric: Denied psychiatric symptoms, anxiety and depression.  Endocrine: Denied endocrine symptoms including hot flashes and night sweats.   Meds:   Current Outpatient Medications on File Prior to Visit  Medication Sig Dispense Refill  . cholecalciferol (VITAMIN D3) 25 MCG (1000 UT) tablet Take 1,000 Units by mouth daily.    Marland Kitchen dicyclomine (BENTYL) 20 MG tablet TK 1 T PO TID  2  . fluocinonide cream (LIDEX) 1.68 % Apply 1 application topically 2  (two) times  daily. 30 g 2  . fluticasone (FLONASE) 50 MCG/ACT nasal spray instill 2 sprays into each nostril once daily 16 g 11  . LINZESS 72 MCG capsule TK 1 C PO QD. TAKE 30 MINUTES BEFORE BREAKFAST  3  . loratadine (CLARITIN) 10 MG tablet Take 1 tablet (10 mg total) by mouth daily. 30 tablet 0  . LORazepam (ATIVAN) 1 MG tablet take 1 tablet by mouth twice a day if needed for anxiety DO NO DRINK ALCOHOL WHILE TAKING THIS MEDICATION 60 tablet 1  . Multiple Vitamin (MULTI-VITAMINS) TABS Take 1 tablet by mouth daily.     . naloxone (NARCAN) nasal spray 4 mg/0.1 mL Spray into one nostril. Repeat with second device into other nostril after 2-3 minutes if no or minimal response. Use in case of opioid overdose. 1 kit 0  . Omega-3 Fatty Acids (FISH OIL) 1200 MG CAPS Take by mouth.    Derrill Memo ON 11/04/2018] oxyCODONE (OXY IR/ROXICODONE) 5 MG immediate release tablet Take 1 tablet (5 mg total) by mouth every 6 (six) hours as needed for severe pain. 120 tablet 0  . oxyCODONE (OXY IR/ROXICODONE) 5 MG immediate release tablet Take 1 tablet (5 mg total) by mouth every 6 (six) hours as needed for severe pain. 120 tablet 0  . RABEprazole (ACIPHEX) 20 MG tablet Take 20 mg by mouth as needed.     . sucralfate (CARAFATE) 1 g tablet Take by mouth daily.   0  . TRINTELLIX 5 MG TABS tablet take 3 tablets by mouth once daily 90 tablet 3  . oxyCODONE (OXY IR/ROXICODONE) 5 MG immediate release tablet Take 1 tablet (5 mg total) by mouth every 6 (six) hours as needed for severe pain. 120 tablet 0   No current facility-administered medications on file prior to visit.     Objective:     Vitals:   10/22/18 1003  BP: 108/74  Pulse: 85     Physical examination General NAD, Conversant  HEENT Atraumatic; Op clear with mmm.  Normo-cephalic. Pupils reactive. Anicteric sclerae  Thyroid/Neck Smooth without nodularity or enlargement. Normal ROM.  Neck Supple.  Skin No rashes, lesions or ulceration. Normal palpated skin  turgor. No nodularity.  Breasts: No masses or discharge.  Symmetric.  No axillary adenopathy.  Lungs: Clear to auscultation.No rales or wheezes. Normal Respiratory effort, no retractions.  Heart: NSR.  No murmurs or rubs appreciated. No periferal edema  Abdomen: Soft.  Non-tender.  No masses.  No HSM. No hernia  Extremities: Moves all appropriately.  Normal ROM for age. No lymphadenopathy.  Neuro: Oriented to PPT.  Normal mood. Normal affect.     Pelvic:   Vulva: Normal appearance.  No lesions.  Vagina: No lesions or abnormalities noted.  Support: Normal pelvic support.  Urethra No masses tenderness or scarring.  Meatus Normal size without lesions or prolapse.  Cervix: Normal appearance.  No lesions.  Anus: Normal exam.  No lesions.  Perineum: Normal exam.  No lesions.        Bimanual   Uterus: Normal size.  Non-tender.  Mobile.  AV.  Adnexae: No masses.  Non-tender to palpation.  Cul-de-sac: Negative for abnormality.      Assessment:    [id number] Patient Active Problem List   Diagnosis Date Noted  . Trypanophobia (Needle phobia) 09/26/2018  . Spondylosis without myelopathy or radiculopathy, lumbar region 08/29/2018  . Lumbar facet hypertrophy 08/29/2018  . Lumbar facet syndrome (Right) 08/29/2018  . DDD (degenerative disc disease), thoracic 08/29/2018  .  Thoracic spondylosis with radiculopathy (Right) 08/29/2018  . Spinal enthesopathy of lumbar region ([place]) 08/29/2018  . Chronic midline low back pain without sciatica 08/29/2018  . Generalized anxiety disorder 07/23/2018  . Cervical facet hypertrophy (Bilateral) 07/18/2018  . Cervical central spinal stenosis 07/18/2018  . Chronic shoulder pain (Left) 07/09/2018  . Cervicalgia 03/28/2018  . Spondylosis without myelopathy or radiculopathy, cervical region 02/11/2018  . Chronic pelvic pain in female 08/24/2017  . Fatty liver 08/24/2017  . Chronic low back pain (Bilateral) (L>R) 08/13/2017  . Cervical Foraminal Stenosis  (Severe) (Bilateral: C-5-6) 08/13/2017  . Cervical spondylosis with radiculopathy (Bilateral) (L>R) 08/13/2017  . Cervical sensory radiculopathy at C5 (Bilateral) 08/13/2017  . Radicular pain of shoulder (Bilateral) (C5) 08/13/2017  . Constipation 07/16/2017  . Myofascial pain syndrome, cervical (trapezius) (Left) 05/28/2017  . Cervical facet syndrome (Bilateral) (L>R) 05/28/2017  . Muscle spasm 05/15/2017  . Chronic hip pain (Right) 03/19/2017  . Chronic knee arthropathy (Right) 03/14/2017  . Osteoarthritis of knee (Right) 02/21/2017  . Osteoarthritis of shoulder (Right) 01/31/2017  . Shortness of breath 01/15/2017  . Thoracic spondylosis 01/01/2017  . Thoracic radiculitis (R>L) 12/21/2016  . Osteoarthritis of hip (Bilateral) 11/20/2016  . Greater trochanteric bursitis (Right) 09/18/2016  . Subacromial bursitis of shoulder joint (Right) 09/18/2016  . Overweight (BMI 25.0-29.9) 09/14/2016  . Elevated sedimentation rate 08/31/2016  . Elevated C-reactive protein (CRP) 08/31/2016  . Long term current use of opiate analgesic 07/20/2016  . Long term prescription opiate use 07/20/2016  . Opiate use 07/20/2016  . Chronic upper back pain (Secondary Area of Pain) (Bilateral) (L>R) 07/20/2016  . Chronic shoulder pain [facility] Area of Pain) (Bilateral) (L>R) 07/20/2016  . Chronic knee pain (Right) 07/20/2016  . Chronic upper extremity pain (Left) 07/20/2016  . Chronic lower extremity pain (Bilateral) (L>R) 07/20/2016  . Chronic abdominal pain 07/20/2016  . Chronic hip pain (Bilateral) (R>L) 05/31/2016  . Chronic Greater trochanteric bursitis (Bilateral) (L>R) 05/31/2016  . Bursitis of shoulder (Right) 05/17/2016  . Skin lesions 04/27/2016  . Hyperlipidemia 02/18/2016  . Cervical spondylosis (Bilateral) 02/08/2016  . Fibromyalgia 02/08/2016  . Osteopenia 02/08/2016  . Chronic neck pain (Primary Area of Pain) (Bilateral) (L>R) 01/28/2016  . Concentration deficit 01/28/2016  . Routine  general medical examination at a health care facility 01/14/2016  . Lumbar spondylosis 01/06/2016  . Anxiety and depression 07/11/2015  . Chronic pain syndrome 07/11/2015  . Combined fat and carbohydrate induced hyperlipemia 07/11/2015     1. Bilateral low back pain without sciatica, unspecified chronicity   2. Frequency of urination   3. Encounter for annual physical exam   4. 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  Basic Screening Recommendations The basic screening recommendations for asymptomatic women were discussed with the patient during her visit.  The age-appropriate recommendations were discussed with her and the rational for the tests reviewed.  When I am informed by the patient that another primary care physician has previously obtained the age-appropriate tests and they are up-to-date, only outstanding tests are ordered and referrals given as necessary.  Abnormal results of tests will be discussed with her when all of her results are completed. Pap with HPV performed -patient is up-to-date on mammography 2.  Urine for C&S-we will contact patient if abnormal. Orders Orders Placed This Encounter  Procedures  . POCT urinalysis dipstick    No orders of the  defined types were placed in this encounter.       F/U  Return in about 1 year (around 10/23/2019) for Annual Physical.  Finis Bud, M.D. 10/22/2018 11:15 AM

## 2018-10-24 ENCOUNTER — Ambulatory Visit
Admission: RE | Admit: 2018-10-24 | Discharge: 2018-10-24 | Disposition: A | Discharge status: Home / Self Care | Payer: BLUE CROSS/BLUE SHIELD | Source: Ambulatory Visit | Attending: Pain Medicine | Admitting: Pain Medicine

## 2018-10-24 ENCOUNTER — Ambulatory Visit (HOSPITAL_BASED_OUTPATIENT_CLINIC_OR_DEPARTMENT_OTHER): Payer: BLUE CROSS/BLUE SHIELD | Admitting: Pain Medicine

## 2018-10-24 ENCOUNTER — Other Ambulatory Visit: Payer: Self-pay | Admitting: Family Medicine

## 2018-10-24 ENCOUNTER — Other Ambulatory Visit: Payer: Self-pay

## 2018-10-24 ENCOUNTER — Encounter: Payer: Self-pay | Admitting: Pain Medicine

## 2018-10-24 VITALS — BP 134/83 | HR 75 | Temp 97.1°F | Resp 16 | Ht 64.0 in | Wt 163.0 lb

## 2018-10-24 DIAGNOSIS — M542 Cervicalgia: Secondary | ICD-10-CM | POA: Diagnosis present

## 2018-10-24 DIAGNOSIS — M47812 Spondylosis without myelopathy or radiculopathy, cervical region: Secondary | ICD-10-CM | POA: Insufficient documentation

## 2018-10-24 LAB — URINE CULTURE: Organism ID, Bacteria: NO GROWTH

## 2018-10-24 MED ORDER — ROPIVACAINE HCL 2 MG/ML IJ SOLN
18.0000 mL | Freq: Once | INTRAMUSCULAR | Status: AC
  Administered 2018-10-24: 18 mL via PERINEURAL
  Filled 2018-10-24: qty 20

## 2018-10-24 MED ORDER — FENTANYL CITRATE (PF) 100 MCG/2ML IJ SOLN
25.0000 ug | INTRAMUSCULAR | Status: DC | PRN
  Administered 2018-10-24: 100 ug via INTRAVENOUS
  Filled 2018-10-24: qty 2

## 2018-10-24 MED ORDER — LIDOCAINE HCL 2 % IJ SOLN
20.0000 mL | Freq: Once | INTRAMUSCULAR | Status: AC
  Administered 2018-10-24: 400 mg
  Filled 2018-10-24: qty 40

## 2018-10-24 MED ORDER — DEXAMETHASONE SODIUM PHOSPHATE 10 MG/ML IJ SOLN
20.0000 mg | Freq: Once | INTRAMUSCULAR | Status: AC
  Administered 2018-10-24: 20 mg
  Filled 2018-10-24: qty 2

## 2018-10-24 MED ORDER — MIDAZOLAM HCL 5 MG/5ML IJ SOLN
1.0000 mg | INTRAMUSCULAR | Status: DC | PRN
  Administered 2018-10-24: 5 mg via INTRAVENOUS
  Filled 2018-10-24: qty 5

## 2018-10-24 MED ORDER — LACTATED RINGERS IV SOLN
1000.0000 mL | Freq: Once | INTRAVENOUS | Status: AC
  Administered 2018-10-24: 1000 mL via INTRAVENOUS

## 2018-10-24 NOTE — Patient Instructions (Addendum)
____________________________________________________________________________________________  Post-Procedure Discharge Instructions  Instructions:  Apply ice: Fill a plastic sandwich bag with crushed ice. Cover it with a small towel and apply to injection site. Apply for 15 minutes then remove x 15 minutes. Repeat sequence on day of procedure, until you go to bed. The purpose is to minimize swelling and discomfort after procedure.  Apply heat: Apply heat to procedure site starting the day following the procedure. The purpose is to treat any soreness and discomfort from the procedure.  Food intake: Start with clear liquids (like water) and advance to regular food, as tolerated.   Physical activities: Keep activities to a minimum for the first 8 hours after the procedure.   Driving: If you have received any sedation, you are not allowed to drive for 24 hours after your procedure.  Blood thinner: Restart your blood thinner 6 hours after your procedure. (Only for those taking blood thinners)  Insulin: As soon as you can eat, you may resume your normal dosing schedule. (Only for those taking insulin)  Infection prevention: Keep procedure site clean and dry.  Post-procedure Pain Diary: Extremely important that this be done correctly and accurately. Recorded information will be used to determine the next step in treatment.  Pain evaluated is that of treated area only. Do not include pain from an untreated area.  Complete every hour, on the hour, for the initial 8 hours. Set an alarm to help you do this part accurately.  Do not go to sleep and have it completed later. It will not be accurate.  Follow-up appointment: Keep your follow-up appointment after the procedure. Usually 2 weeks for most procedures. (6 weeks in the case of radiofrequency.) Bring you pain diary.   Expect:  From numbing medicine (AKA: Local Anesthetics): Numbness or decrease in pain.  Onset: Full effect within 15  minutes of injected.  Duration: It will depend on the type of local anesthetic used. On the average, 1 to 8 hours.   From steroids: Decrease in swelling or inflammation. Once inflammation is improved, relief of the pain will follow.  Onset of benefits: Depends on the amount of swelling present. The more swelling, the longer it will take for the benefits to be seen. In some cases, up to 10 days.  Duration: Steroids will stay in the system x 2 weeks. Duration of benefits will depend on multiple posibilities including persistent irritating factors.  Occasional side-effects: Facial flushing (red, warm cheeks) , cramps (if present, drink Gatorade and take over-the-counter Magnesium 450-500 mg once to twice a day).  From procedure: Some discomfort is to be expected once the numbing medicine wears off. This should be minimal if ice and heat are applied as instructed.  Call if:  You experience numbness and weakness that gets worse with time, as opposed to wearing off.  New onset bowel or bladder incontinence. (This applies to Spinal procedures only)  Emergency Numbers:  Durning business hours (Monday - Thursday, 8:00 AM - 4:00 PM) (Friday, 9:00 AM - 12:00 Noon): [phone]  After hours: [phone] ____________________________________________________________________________________________   Preparing for Procedure with Sedation Instructions: . Oral Intake: Do not eat or drink anything for at least 8 hours prior to your procedure. . Transportation: Public transportation is not allowed. Bring an adult driver. The driver must be physically present in our waiting room before any procedure can be started. [facility] Physical Assistance: Bring an adult capable of physically assisting you, in the event you need help. . Blood Pressure Medicine: Take your  blood pressure medicine with a sip of water the morning of the procedure. . Insulin: Take only  of your normal insulin dose. . Preventing  infections: Shower with an antibacterial soap the morning of your procedure. . Build-up your immune system: Take 1000 mg of Vitamin C with every meal (3 times a day) the day prior to your procedure. . Pregnancy: If you are pregnant, call and cancel the procedure. . Sickness: If you have a cold, fever, or any active infections, call and cancel the procedure. . Arrival: You must be in the facility at least 30 minutes prior to your scheduled procedure. . Children: Do not bring children with you. . Dress appropriately: Bring dark clothing that you would not mind if they get stained. . Valuables: Do not bring any jewelry or valuables. Procedure appointments are reserved for interventional treatments only. Mar[facility] Kitchen No Prescription Refills. . No medication changes will be discussed during procedure appointments. No disability issues will be discussed.Post-procedure Information What to expect: Most procedures involve the use of a local anesthetic (numbing medicine), and a steroid (anti-inflammatory medicine).  The local anesthetics may cause temporary numbness and weakness of the legs or arms, depending on the location of the block. This numbness/weakness may last 4-6 hours, depending on the local anesthetic used. In rare instances, it can last up to 24 hours. While numb, you must be very careful not to injure the extremity.  After any procedure, you could expect the pain to get better within 15-20 minutes. This relief is temporary and may last 4-6 hours. Once the local anesthetics wears off, you could experience discomfort, possibly more than usual, for up to 10 (ten) days. In the case of radiofrequencies, it may last up to 6 weeks. Surgeries may take up to 8 weeks for the healing process. The discomfort is due to the irritation caused by needles going through skin and muscle. To minimize the discomfort, we recommend using ice the first day, and heat from then on. The ice should be applied for 15 minutes on, and  15 minutes off. Keep repeating this cycle until bedtime. Avoid applying the ice directly to the skin, to prevent frostbite. Heat should be used daily, until the pain improves (4-10 days). Be careful not to burn yourself.  Occasionally you may experience muscle spasms or cramps. These occur as a consequence of the irritation caused by the needle sticks to the muscle and the blood that will inevitably be lost into the surrounding muscle tissue. Blood tends to be very irritating to tissues, which tend to react by going into spasm. These spasms may start the same day of your procedure, but they may also take days to develop. This late onset type of spasm or cramp is usually caused by electrolyte imbalances triggered by the steroids, at the level of the kidney. Cramps and spasms tend to respond well to muscle relaxants, multivitamins (some are triggered by the procedure, but may have their origins in vitamin deficiencies), and "Gatorade", or any sports drinks that can replenish any electrolyte imbalances. (If you are a diabetic, ask your pharmacist to get you a sugar-free brand.) Warm showers or baths may also be helpful. Stretching exercises are highly recommended. General Instructions:  Be alert for signs of possible infection: redness, swelling, heat, red streaks, elevated temperature, and/or fever. These typically appear 4 to 6 days after the procedure. Immediately notify your doctor if you experience unusual bleeding, difficulty breathing, or loss of bowel or bladder control. If you experience increased pain,  do not increase your pain medicine intake, unless instructed by your pain physician. Post-Procedure Care:  Be careful in moving about. Muscle spasms in the area of the injection may occur. Applying ice or heat to the area is often helpful. The incidence of spinal headaches after epidural injections ranges between 1.4% and 6%. If you develop a headache that does not seem to respond to conservative therapy,  please let your physician know. This can be treated with an epidural blood patch.   Post-procedure numbness or redness is to be expected, however it should average 4 to 6 hours. If numbness and weakness of your extremities begins to develop 4 to 6 hours after your procedure, and is felt to be progressing and worsening, immediately contact your physician.   Diet:  If you experience nausea, do not eat until this sensation goes away. If you had a "Stellate Ganglion Block" for upper extremity "Reflex Sympathetic Dystrophy", do not eat or drink until your hoarseness goes away. In any case, always start with liquids first and if you tolerate them well, then slowly progress to more solid foods. Activity:  For the first 4 to 6 hours after the procedure, use caution in moving about as you may experience numbness and/or weakness. Use caution in cooking, using household electrical appliances, and climbing steps. If you need to reach your Doctor call our office: [phone] Monday-Thursday 8:00 am - 4:00 PM    Fridays: Closed     In case of an emergency: In case of emergency, call 911 or go to the nearest emergency room and have the physician there call us.  Interpretation of Procedure Every nerve block has two components: a diagnostic component, and a treatment component. Unrealistic expectations are the most common causes of "perceived failure".  In a perfect world, a single nerve block should be able to completely and permanently eliminate the pain. Sadly, the world is not perfect.  Most pain management nerve blocks are performed using local anesthetics and steroids. Steroids are responsible for any long-term benefit that you may experience. Their purpose is to decrease any chronic swelling that may exist in the area. Steroids begin to work immediately after being injected. However, most patients will not experience any benefits until 5 to 10 days after the injection, when the swelling has come down to the  point where they can tell a difference. Steroids will only help if there is swelling to be treated. As such, they can assist with the diagnosis. If effective, they suggest an inflammatory component to the pain, and if ineffective, they rule out inflammation as the main cause or component of the problem. If the problem is one of mechanical compression, you will get no benefit from those steroids.   In the case of local anesthetics, they have a crucial role in the diagnosis of your condition. Most will begin to work within15 to 20 minutes after injection. The duration will depend on the type used (short- vs. Long-acting). It is of outmost importance that patients keep tract of their pain, after the procedure. To assist with this matter, a "Post-procedure Pain Diary" is provided. Make sure to complete it and to bring it back to your follow-up appointment.  As long as the patient keeps accurate, detailed records of their symptoms after every procedure, and returns to have those interpreted, every procedure will provide [place] with invaluable information. Even a block that does not provide the patient with any relief, will always provide [place] with information about the mechanism  and the origin of the pain. The only time a nerve block can be considered a waste of time is when patients do not keep track of the results, or do not keep their post-procedure appointment.  Reporting the results back to your physician The Pain Score  Pain is a subjective complaint. It cannot be seen, touched, or measured. We depend entirely on the patient's report of the pain in order to assess your condition and treatment. To evaluate the pain, we use a pain scale, where "0" means "No Pain", and a "10" is "the worst possible pain that you can even imagine" (i.e. something like been eaten alive by a shark or being torn apart by a lion).  .  You will frequently be asked to rate your pain. Please be as accurate, remember that medical decisions  will be based on your responses. Please do not rate your pain above a 10. Doing so is actually interpreted as "symptom magnification" (exaggeration), as well as lack of understanding with regards to the scale. To put this into perspective, when you tell us that your pain is at a 10 (ten), what you are saying is that there is nothing we can do to make this pain any worse. (Carefully think about that.)

## 2018-10-24 NOTE — Telephone Encounter (Signed)
Controlled substance database reviewed. Sent to pharmacy.   

## 2018-10-24 NOTE — Progress Notes (Signed)
Patient's Name: Nicole Walter  MRN: [id number]  Referring Provider: Leone Haven, MD  DOB: 1960-12-14  PCP: Leone Haven, MD  DOS: 10/24/2018  Note by: Gaspar Cola, MD  Service setting: Ambulatory outpatient  Specialty: Interventional Pain Management  Patient type: Established  Location: [facility] (AMB) Pain Management Facility  Visit type: Interventional Procedure   Primary Reason for Visit: Interventional Pain Management Treatment. CC: Neck Pain (bilateral, worse on left side)  Procedure:          Anesthesia, Analgesia, Anxiolysis:  Type: Cervical Facet Medial Branch Block(s)           Primary Purpose: Palliative Region: Posterolateral cervical spine Level: C3, C4, C5, C6, & C7 Medial Branch Level(s). Injecting these levels blocks the C3-4, C4-5, C5-6, and C6-7 cervical facet joints. Laterality: Bilateral  Type: Moderate (Conscious) Sedation combined with Local Anesthesia Indication(s): Analgesia and Anxiety Route: Intravenous (IV) IV Access: Secured Sedation: Meaningful verbal contact was maintained at all times during the procedure  Local Anesthetic: Lidocaine 1-2%  Position: Prone with head of the table raised to facilitate breathing.   Indications: 1. Spondylosis without myelopathy or radiculopathy, cervical region   2. Cervical facet hypertrophy (Bilateral)   3. Cervical facet syndrome (Bilateral) (L>R)   4. Cervical spondylosis (Bilateral)   5. Cervicalgia    Pain Score: Pre-procedure: 2 /10 Post-procedure: 0-No pain/10  Pre-op Assessment:  Nicole Walter is a 57 y.o. (year old), female patient, seen today for interventional treatment. She  has a past surgical history that includes Cholecystectomy; Tubal ligation; Ankle surgery; Nasal sinus surgery; Tonsillectomy; Colonoscopy; Esophagogastroduodenoscopy (egd) with propofol (N/A, 02/22/2016); Colonoscopy with propofol (N/A, 02/22/2016); and Breast biopsy (Left, 1997). Nicole Walter has a current medication list which  includes the following prescription(s): cholecalciferol, dicyclomine, fluocinonide cream, fluticasone, linzess, loratadine, lorazepam, multi-vitamins, naloxone, fish oil, oxycodone, oxycodone, rabeprazole, sucralfate, trintellix, and oxycodone, and the following Facility-Administered Medications: fentanyl and midazolam. Her primarily concern today is the Neck Pain (bilateral, worse on left side)  Initial Vital Signs:  Pulse/HCG Rate: 65ECG Heart Rate: 62 Temp: 98.4 F (36.9 C) Resp: 16 BP: 128/74 SpO2: 100 %  BMI: Estimated body mass index is 27.98 kg/m as calculated from the following:   Height as of this encounter: 5\' 4"  (1.626 m).   Weight as of this encounter: 163 lb (73.9 kg).  Risk Assessment: Allergies: Reviewed. She is allergic to other; food; and pea.  Allergy Precautions: None required Coagulopathies: Reviewed. None identified.  Blood-thinner therapy: None at this time Active Infection(s): Reviewed. None identified. Nicole Walter is afebrile  Site Confirmation: Nicole Walter was asked to confirm the procedure and laterality before marking the site Procedure checklist: Completed Consent: Before the procedure and under the influence of no sedative(s), amnesic(s), or anxiolytics, the patient was informed of the treatment options, risks and possible complications. To fulfill our ethical and legal obligations, as recommended by the American Medical Association's Code of Ethics, I have informed the patient of my clinical impression; the nature and purpose of the treatment or procedure; the risks, benefits, and possible complications of the intervention; the alternatives, including doing nothing; the risk(s) and benefit(s) of the alternative treatment(s) or procedure(s); and the risk(s) and benefit(s) of doing nothing. The patient was provided information about the general risks and possible complications associated with the procedure. These may include, but are not limited to: failure to achieve  desired goals, infection, bleeding, organ or nerve damage, allergic reactions, paralysis, and death. In addition, the patient was informed of  those risks and complications associated to Spine-related procedures, such as failure to decrease pain; infection (i.e.: Meningitis, epidural or intraspinal abscess); bleeding (i.e.: epidural hematoma, subarachnoid hemorrhage, or any other type of intraspinal or peri-dural bleeding); organ or nerve damage (i.e.: Any type of peripheral nerve, nerve root, or spinal cord injury) with subsequent damage to sensory, motor, and/or autonomic systems, resulting in permanent pain, numbness, and/or weakness of one or several areas of the body; allergic reactions; (i.e.: anaphylactic reaction); and/or death. Furthermore, the patient was informed of those risks and complications associated with the medications. These include, but are not limited to: allergic reactions (i.e.: anaphylactic or anaphylactoid reaction(s)); adrenal axis suppression; blood sugar elevation that in diabetics may result in ketoacidosis or comma; water retention that in patients with history of congestive heart failure may result in shortness of breath, pulmonary edema, and decompensation with resultant heart failure; weight gain; swelling or edema; medication-induced neural toxicity; particulate matter embolism and blood vessel occlusion with resultant organ, and/or nervous system infarction; and/or aseptic necrosis of one or more joints. Finally, the patient was informed that Medicine is not an exact science; therefore, there is also the possibility of unforeseen or unpredictable risks and/or possible complications that may result in a catastrophic outcome. The patient indicated having understood very clearly. We have given the patient no guarantees and we have made no promises. Enough time was given to the patient to ask questions, all of which were answered to the patient's satisfaction. Nicole Walter has  indicated that she wanted to continue with the procedure. Attestation: I, the ordering provider, attest that I have discussed with the patient the benefits, risks, side-effects, alternatives, likelihood of achieving goals, and potential problems during recovery for the procedure that I have provided informed consent. Date  Time: 10/24/2018  8:39 AM  Pre-Procedure Preparation:  Monitoring: As per clinic protocol. Respiration, ETCO2, SpO2, BP, heart rate and rhythm monitor placed and checked for adequate function Safety Precautions: Patient was assessed for positional comfort and pressure points before starting the procedure. Time-out: I initiated and conducted the "Time-out" before starting the procedure, as per protocol. The patient was asked to participate by confirming the accuracy of the "Time Out" information. Verification of the correct person, site, and procedure were performed and confirmed by me, the nursing staff, and the patient. "Time-out" conducted as per Joint Commission's Universal Protocol (UP.01.01.01). Time: 0925  Description of Procedure:          Laterality: Bilateral. The procedure was performed in identical fashion on both sides. Level: C3, C4, C5, C6, & C7 Medial Branch Level(s). Area Prepped: Posterior Cervico-thoracic Region Prepping solution: ChloraPrep (2% chlorhexidine gluconate and 70% isopropyl alcohol) Safety Precautions: Aspiration looking for blood return was conducted prior to all injections. At no point did we inject any substances, as a needle was being advanced. Before injecting, the patient was told to immediately notify me if she was experiencing any new onset of "ringing in the ears, or metallic taste in the mouth". No attempts were made at seeking any paresthesias. Safe injection practices and needle disposal techniques used. Medications properly checked for expiration dates. SDV (single dose vial) medications used. After the completion of the procedure, all  disposable equipment used was discarded in the proper designated medical waste containers. Local Anesthesia: Protocol guidelines were followed. The patient was positioned over the fluoroscopy table. The area was prepped in the usual manner. The time-out was completed. The target area was identified using fluoroscopy. A 12-in long, straight, sterile hemostat  was used with fluoroscopic guidance to locate the targets for each level blocked. Once located, the skin was marked with an approved surgical skin marker. Once all sites were marked, the skin (epidermis, dermis, and hypodermis), as well as deeper tissues (fat, connective tissue and muscle) were infiltrated with a small amount of a short-acting local anesthetic, loaded on a 10cc syringe with a 25G, 1.5-in  Needle. An appropriate amount of time was allowed for local anesthetics to take effect before proceeding to the next step. Local Anesthetic: Lidocaine 2.0% The unused portion of the local anesthetic was discarded in the proper designated containers. Technical explanation of process:  C3 Medial Branch Nerve Block (MBB): The target area for the C3 dorsal medial articular branch is the lateral concave waist of the articular pillar of C3. Under fluoroscopic guidance, a Quincke needle was inserted until contact was made with os over the postero-lateral aspect of the articular pillar of C3 (target area). After negative aspiration for blood, 0.5 mL of the nerve block solution was injected without difficulty or complication. The needle was removed intact. C4 Medial Branch Nerve Block (MBB): The target area for the C4 dorsal medial articular branch is the lateral concave waist of the articular pillar of C4. Under fluoroscopic guidance, a Quincke needle was inserted until contact was made with os over the postero-lateral aspect of the articular pillar of C4 (target area). After negative aspiration for blood, 0.5 mL of the nerve block solution was injected without  difficulty or complication. The needle was removed intact. C5 Medial Branch Nerve Block (MBB): The target area for the C5 dorsal medial articular branch is the lateral concave waist of the articular pillar of C5. Under fluoroscopic guidance, a Quincke needle was inserted until contact was made with os over the postero-lateral aspect of the articular pillar of C5 (target area). After negative aspiration for blood, 0.5 mL of the nerve block solution was injected without difficulty or complication. The needle was removed intact. C6 Medial Branch Nerve Block (MBB): The target area for the C6 dorsal medial articular branch is the lateral concave waist of the articular pillar of C6. Under fluoroscopic guidance, a Quincke needle was inserted until contact was made with os over the postero-lateral aspect of the articular pillar of C6 (target area). After negative aspiration for blood, 0.5 mL of the nerve block solution was injected without difficulty or complication. The needle was removed intact. C7 Medial Branch Nerve Block (MBB): The target for the C7 dorsal medial articular branch lies on the superior-medial tip of the C7 transverse process. Under fluoroscopic guidance, a Quincke needle was inserted until contact was made with os over the postero-lateral aspect of the articular pillar of C7 (target area). After negative aspiration for blood, 0.5 mL of the nerve block solution was injected without difficulty or complication. The needle was removed intact. Procedural Needles: 22-gauge, 3.5-inch, Quincke needles used for all levels. Nerve block solution: 0.2% PF-Ropivacaine + Triamcinolone (40 mg/mL) diluted to a final concentration of 4 mg of Triamcinolone/mL of Ropivacaine The unused portion of the solution was discarded in the proper designated containers.  Once the entire procedure was completed, the treated area was cleaned, making sure to leave some of the prepping solution back to take advantage of its long  term bactericidal properties.  Vitals:   10/24/18 0940 10/24/18 0951 10/24/18 1001 10/24/18 1011  BP: 122/79 121/76 133/87 134/83  Pulse: 75     Resp: 14 14 18 16   Temp:  (!) 97.1  F (36.2 C)    TempSrc:      SpO2: 94% 96% 100% 100%  Weight:      Height:        Start Time: 0925 hrs. End Time: 0940 hrs.  Imaging Guidance (Spinal):          Type of Imaging Technique: Fluoroscopy Guidance (Spinal) Indication(s): Assistance in needle guidance and placement for procedures requiring needle placement in or near specific anatomical locations not easily accessible without such assistance. Exposure Time: Please see nurses notes. Contrast: None used. Fluoroscopic Guidance: I was personally present during the use of fluoroscopy. "Tunnel Vision Technique" used to obtain the best possible view of the target area. Parallax error corrected before commencing the procedure. "Direction-depth-direction" technique used to introduce the needle under continuous pulsed fluoroscopy. Once target was reached, antero-posterior, oblique, and lateral fluoroscopic projection used confirm needle placement in all planes. Images permanently stored in EMR. Interpretation: No contrast injected. I personally interpreted the imaging intraoperatively. Adequate needle placement confirmed in multiple planes. Permanent images saved into the patient's record.  Antibiotic Prophylaxis:   Anti-infectives (From admission, onward)   None     Indication(s): None identified  Post-operative Assessment:  Post-procedure Vital Signs:  Pulse/HCG Rate: 7563 Temp: (!) 97.1 F (36.2 C) Resp: 16 BP: 134/83 SpO2: 100 %  EBL: None  Complications: No immediate post-treatment complications observed by team, or reported by patient.  Note: The patient tolerated the entire procedure well. A repeat set of vitals were taken after the procedure and the patient was kept under observation following institutional policy, for this type of  procedure. Post-procedural neurological assessment was performed, showing return to baseline, prior to discharge. The patient was provided with post-procedure discharge instructions, including a section on how to identify potential problems. Should any problems arise concerning this procedure, the patient was given instructions to immediately contact us, at any time, without hesitation. In any case, we plan to contact the patient by telephone for a follow-up status report regarding this interventional procedure.  Comments:  No additional relevant information.  Plan of Care  Interventional management options: Planned, scheduled, and/or pending:   Pending therapeutic right cervical facet RFA #1 under fluoro and IV sedation in January.   Considering:   Awaiting for bilateral cervical facet RFA #1  Diagnostic left-sided cervical epidural steroid injection Diagnostic bilateral intra-articular shoulder joint injection Diagnostic bilateral suprascapular nerve block Possible bilateral suprascapular nerve radiofrequencyablation Diagnostic bilateral lumbar facet block Possible bilateral lumbar facet radiofrequencyablation  Diagnostic bilateral intra-articular hip joint injection Diagnostic bilateral femoral and obturator tickler branch blocks Possible bilateral hip joint radiofrequencyablation  Diagnosticright intra-articular knee joint injection Possible series of 5, right-sided, intra-articular knee joint injections with Hyalgan.  Possible right sided genicular nerve block Possible right sided genicular nerve radiofrequencyablation Possible left-sided lumbar epidural steroid injection   Palliative PRN treatment(s):   Palliativebilateral suprascapular nerve block  Palliativeright intra-articular knee joint injection  Palliative bilateral cervical facet block    Imaging Orders     DG C-Arm 1-60 Min-No Report  Procedure Orders     CERVICAL FACET (MEDIAL BRANCH NERVE BLOCK)    Medications ordered for procedure: Meds ordered this encounter  Medications  . lidocaine (XYLOCAINE) 2 % (with pres) injection 400 mg  . midazolam (VERSED) 5 MG/5ML injection 1-2 mg    Make sure Flumazenil is available in the pyxis when using this medication. If oversedation occurs, administer 0.2 mg IV over 15 sec. If after 45 sec no response, administer 0.2 mg again  over 1 min; may repeat at 1 min intervals; not to exceed 4 doses (1 mg)  . fentaNYL (SUBLIMAZE) injection 25-50 mcg    Make sure Narcan is available in the pyxis when using this medication. In the event of respiratory depression (RR< 8/min): Titrate NARCAN (naloxone) in increments of 0.1 to 0.2 mg IV at 2-3 minute intervals, until desired degree of reversal.  . lactated ringers infusion 1,000 mL  . ropivacaine (PF) 2 mg/mL (0.2%) (NAROPIN) injection 18 mL  . dexamethasone (DECADRON) injection 20 mg   Medications administered: We administered lidocaine, midazolam, fentaNYL, lactated ringers, ropivacaine (PF) 2 mg/mL (0.2%), and dexamethasone.  See the medical record for exact dosing, route, and time of administration.  Disposition: Discharge home  Discharge Date & Time: 10/24/2018; 1022 hrs.   Physician-requested Follow-up: Return for post-procedure eval (2 wks), w/ Dionisio David, NP (Keep RFA appointment).  Future Appointments  Date Time Provider [place]  10/31/2018  2:20 PM [facility] DEXA 1 [facility]  11/12/2018  1:45 PM Vevelyn Francois, NP [facility] None  02/21/2019 10:00 AM Leone Haven, MD [facility]  10/24/2019 10:00 AM Harlin Heys, MD [facility] None   Primary Care Physician: Leone Haven, MD Location: [facility] Out[facility] Management [facility] Note by: Gaspar Cola, MD Date: 10/24/2018; Time: 10:27 AM  Disclaimer:  Medicine is not an exact science. The only guarantee in medicine is that nothing is guaranteed. It is important to note that the decision to proceed with this  intervention was based on the information collected from the patient. The Data and conclusions were drawn from the patient's questionnaire, the interview, and the physical examination. Because the information was provided in large part by the patient, it cannot be guaranteed that it has not been purposely or unconsciously manipulated. Every effort has been made to obtain as much relevant data as possible for this evaluation. It is important to note that the conclusions that lead to this procedure are derived in large part from the available data. Always take into account that the treatment will also be dependent on availability of resources and existing treatment guidelines, considered by other Pain Management Practitioners as being common knowledge and practice, at the time of the intervention. For Medico-Legal purposes, it is also important to point out that variation in procedural techniques and pharmacological choices are the acceptable norm. The indications, contraindications, technique, and results of the above procedure should only be interpreted and judged by a Board-Certified Interventional Pain Specialist with extensive familiarity and expertise in the same exact procedure and technique.

## 2018-10-24 NOTE — Telephone Encounter (Signed)
Last OV 10/11/2018   Lorazepam last refilled 08/27/2018 disp 60 with 1 refill   Sent to PCP for approval for controlled

## 2018-10-25 ENCOUNTER — Telehealth: Payer: Self-pay

## 2018-10-25 LAB — CYTOLOGY - PAP
Diagnosis: NEGATIVE
HPV: NOT DETECTED

## 2018-10-25 NOTE — Telephone Encounter (Signed)
Post [procedure phone call.  No questions or concerns noted.

## 2018-10-28 ENCOUNTER — Encounter

## 2018-10-29 ENCOUNTER — Ambulatory Visit: Payer: BLUE CROSS/BLUE SHIELD | Admitting: Family Medicine

## 2018-10-31 ENCOUNTER — Ambulatory Visit
Admission: RE | Admit: 2018-10-31 | Discharge: 2018-10-31 | Disposition: A | Discharge status: Home / Self Care | Payer: BLUE CROSS/BLUE SHIELD | Source: Ambulatory Visit | Attending: Family Medicine | Admitting: Family Medicine

## 2018-10-31 DIAGNOSIS — Z780 Asymptomatic menopausal state: Secondary | ICD-10-CM | POA: Insufficient documentation

## 2018-11-04 ENCOUNTER — Encounter: Payer: BLUE CROSS/BLUE SHIELD | Admitting: Physical Therapy

## 2018-11-05 ENCOUNTER — Telehealth: Payer: Self-pay | Admitting: Family Medicine

## 2018-11-05 NOTE — Telephone Encounter (Signed)
Copied from [id number]. Topic: General - Other >> Nov 05, 2018 12:14 PM Yvette Rack wrote: Reason for CRM: pt calling about bone density results

## 2018-11-07 NOTE — Telephone Encounter (Signed)
Patient notified of results 12/31.

## 2018-11-11 ENCOUNTER — Encounter: Payer: BLUE CROSS/BLUE SHIELD | Admitting: Physical Therapy

## 2018-11-11 ENCOUNTER — Telehealth: Payer: Self-pay | Admitting: *Deleted

## 2018-11-11 DIAGNOSIS — Z87891 Personal history of nicotine dependence: Secondary | ICD-10-CM

## 2018-11-11 DIAGNOSIS — Z122 Encounter for screening for malignant neoplasm of respiratory organs: Secondary | ICD-10-CM

## 2018-11-11 NOTE — Telephone Encounter (Signed)
Patient has been notified that annual lung cancer screening low dose CT scan is due currently or will be in near future. Confirmed that patient is within the age range of 55-77, and asymptomatic, (no signs or symptoms of lung cancer). Patient denies illness that would prevent curative treatment for lung cancer if found. Verified smoking history, (former, quit 9/15, 35 pack year). The shared decision making visit was done 05/01/17. Patient is agreeable for CT scan being scheduled.

## 2018-11-12 ENCOUNTER — Other Ambulatory Visit: Payer: Self-pay

## 2018-11-12 ENCOUNTER — Encounter: Payer: Self-pay | Admitting: Nurse Practitioner

## 2018-11-12 ENCOUNTER — Ambulatory Visit: Payer: BLUE CROSS/BLUE SHIELD | Attending: Nurse Practitioner | Admitting: Nurse Practitioner

## 2018-11-12 VITALS — BP 122/77 | HR 68 | Temp 97.9°F | Resp 16 | Ht 64.0 in | Wt 163.0 lb

## 2018-11-12 DIAGNOSIS — M47812 Spondylosis without myelopathy or radiculopathy, cervical region: Secondary | ICD-10-CM | POA: Insufficient documentation

## 2018-11-12 DIAGNOSIS — G894 Chronic pain syndrome: Secondary | ICD-10-CM | POA: Diagnosis present

## 2018-11-12 DIAGNOSIS — M797 Fibromyalgia: Secondary | ICD-10-CM | POA: Insufficient documentation

## 2018-11-12 DIAGNOSIS — M47816 Spondylosis without myelopathy or radiculopathy, lumbar region: Secondary | ICD-10-CM | POA: Insufficient documentation

## 2018-11-12 MED ORDER — OXYCODONE HCL 5 MG PO TABS: 5.0000 mg | ORAL_TABLET | Freq: Four times a day (QID) | ORAL | 0 refills | Status: DC | PRN

## 2018-11-12 MED ORDER — NAPROXEN-ESOMEPRAZOLE 500-20 MG PO TBEC: 500.0000 mg | DELAYED_RELEASE_TABLET | Freq: Two times a day (BID) | ORAL | 2 refills | Status: AC

## 2018-11-12 NOTE — Progress Notes (Signed)
Nursing Pain Medication Assessment:  Safety precautions to be maintained throughout the outpatient stay will include: orient to surroundings, keep bed in low position, maintain call bell within reach at all times, provide assistance with transfer out of bed and ambulation.  Medication Inspection Compliance: Pill count conducted under aseptic conditions, in front of the patient. Neither the pills nor the bottle was removed from the patient's sight at any time. Once count was completed pills were immediately returned to the patient in their original bottle.  Medication: Oxycodone IR Pill/Patch Count: 81 of 120 pills remain Pill/Patch Appearance: Markings consistent with prescribed medication Bottle Appearance: Standard pharmacy container. Clearly labeled. Filled Date: 64 / 30 / 2019 Last Medication intake:  Today

## 2018-11-12 NOTE — Progress Notes (Signed)
Patient's Name: Nicole Walter  MRN: [id number]  Referring Provider: Leone Haven, MD  DOB: Mar 03, 1961  PCP: Leone Haven, MD  DOS: 11/12/2018  Note by: Vevelyn Francois NP  Service setting: Ambulatory outpatient  Specialty: Interventional Pain Management  Location: [facility] (AMB) Pain Management Facility    Patient type: Established    Primary Reason(s) for Visit: Encounter for prescription drug management & post-procedure evaluation of chronic illness with mild to moderate exacerbation(Level of risk: moderate) CC: Neck Pain; Back Pain (lower); and Fibromyalgia  HPI  Nicole Walter is a 58 y.o. year old, female patient, who comes today for a post-procedure evaluation and medication management. She has Anxiety and depression; Chronic pain syndrome; Lumbar spondylosis; Combined fat and carbohydrate induced hyperlipemia; Routine general medical examination at a health care facility; Chronic neck pain (Primary Area of Pain) (Bilateral) (L>R); Concentration deficit; Hyperlipidemia; Cervical spondylosis (Bilateral); Fibromyalgia; Osteopenia; Skin lesions; Chronic hip pain (Bilateral) (R>L); Bursitis of shoulder (Right); Chronic Greater trochanteric bursitis (Bilateral) (L>R); Long term current use of opiate analgesic; Long term prescription opiate use; Opiate use; Chronic upper back pain (Secondary Area of Pain) (Bilateral) (L>R); Chronic shoulder pain (Tertiary Area of Pain) (Bilateral) (L>R); Chronic knee pain (Right); Chronic upper extremity pain (Left); Chronic lower extremity pain (Bilateral) (L>R); Chronic abdominal pain; Elevated sedimentation rate; Elevated C-reactive protein (CRP); Overweight (BMI 25.0-29.9); Greater trochanteric bursitis (Right); Subacromial bursitis of shoulder joint (Right); Osteoarthritis of hip (Bilateral); Thoracic radiculitis (R>L); Thoracic spondylosis; Shortness of breath; Osteoarthritis of shoulder (Right); Osteoarthritis of knee (Right); Chronic knee arthropathy (Right);  Chronic hip pain (Right); Muscle spasm; Myofascial pain syndrome, cervical (trapezius) (Left); Cervical facet syndrome (Bilateral) (L>R); Constipation; Chronic low back pain (Bilateral) (L>R); Cervical Foraminal Stenosis (Severe) (Bilateral: C-5-6); Cervical spondylosis with radiculopathy (Bilateral) (L>R); Cervical sensory radiculopathy at C5 (Bilateral); Radicular pain of shoulder (Bilateral) (C5); Chronic pelvic pain in female; Fatty liver; Spondylosis without myelopathy or radiculopathy, cervical region; Cervicalgia; Chronic shoulder pain (Left); Cervical facet hypertrophy (Bilateral); Cervical central spinal stenosis; Generalized anxiety disorder; Spondylosis without myelopathy or radiculopathy, lumbar region; Lumbar facet hypertrophy; Lumbar facet syndrome (Right); DDD (degenerative disc disease), thoracic; Thoracic spondylosis with radiculopathy (Right); Spinal enthesopathy of lumbar region [facility]); Chronic midline low back pain without sciatica; and Trypanophobia (Needle phobia) on their problem list. Her primarily concern today is the Neck Pain; Back Pain (lower); and Fibromyalgia  Pain Assessment: Location:   Neck Radiating: shoulders bilateral down arms Onset: More than a month ago Duration: Chronic pain Quality: Aching, Constant, Burning, Throbbing Severity: 2 /10 (subjective, self-reported pain score)  Note: Reported level is compatible with observation.                          Effect on ADL: limits activites, prolonged walking. hard to sleep Timing: Constant Modifying factors: medication, heat, ice, rest BP: 122/77  HR: 68  Nicole Walter was last seen on 10/25/2018 for a procedure. During today's appointment we reviewed Nicole Walter's post-procedure results, as well as her outpatient medication regimen. She admits that she is having symptoms of vertigo which come and go. She states that she feels drunk. She admits that it is not all the time. She is wanting something form the OA.   Further  details on both, my assessment(s), as well as the proposed treatment plan, please see below.  Controlled Substance Pharmacotherapy Assessment REMS (Risk Evaluation and Mitigation Strategy)  Analgesic:Oxycodone one tablet by mouth 4times a day (52m/dayof hydrocodone) MME/day:346mday  Nicole Shames  Walter, Nicole Walter  11/12/2018  2:14 PM  Sign when Signing Visit Nursing Pain Medication Assessment:  Safety precautions to be maintained throughout the outpatient stay will include: orient to surroundings, keep bed in low position, maintain call bell within reach at all times, provide assistance with transfer out of bed and ambulation.  Medication Inspection Compliance: Pill count conducted under aseptic conditions, in front of the patient. Neither the pills nor the bottle was removed from the patient's sight at any time. Once count was completed pills were immediately returned to the patient in their original bottle.  Medication: Oxycodone IR Pill/Patch Count: 81 of 120 pills remain Pill/Patch Appearance: Markings consistent with prescribed medication Bottle Appearance: Standard pharmacy container. Clearly labeled. Filled Date: 102 / 30 / 2019 Last Medication intake:  Today   Pharmacokinetics: Liberation and absorption (onset of action): WNL Distribution (time to peak effect): WNL Metabolism and excretion (duration of action): WNL         Pharmacodynamics: Desired effects: Analgesia: Nicole Walter reports >50% benefit. Functional ability: Patient reports that medication allows her to accomplish basic ADLs Clinically meaningful improvement in function (CMIF): Sustained CMIF goals met Perceived effectiveness: Described as relatively effective, allowing for increase in activities of daily living (ADL) Undesirable effects: Side-effects or Adverse reactions: None reported Monitoring: [place] PMP: Online review of the past 78-monthperiod conducted. Compliant with practice rules and regulations Last UDS on  record: Summary  Date Value Ref Range Status  08/19/2018 FINAL  Final    Comment:    ==================================================================== TOXASSURE SELECT 13 (MW) ==================================================================== Test                             Result       Flag       Units Drug Present and Declared for Prescription Verification   Lorazepam                      1903         EXPECTED   ng/mg creat    Source of lorazepam is a scheduled prescription medication.   Oxycodone                      127          EXPECTED   ng/mg creat   Oxymorphone                    395          EXPECTED   ng/mg creat   Noroxycodone                   1203         EXPECTED   ng/mg creat   Noroxymorphone                 265          EXPECTED   ng/mg creat    Sources of oxycodone are scheduled prescription medications.    Oxymorphone, noroxycodone, and noroxymorphone are expected    metabolites of oxycodone. Oxymorphone is also available as a    scheduled prescription medication. ==================================================================== Test                      Result    Flag   Units      Ref Range   Creatinine  98               mg/dL      >=20 ==================================================================== Declared Medications:  The flagging and interpretation on this report are based on the  following declared medications.  Unexpected results may arise from  inaccuracies in the declared medications.  **Note: The testing scope of this panel includes these medications:  Lorazepam (Ativan)  Oxycodone (Roxicodone)  **Note: The testing scope of this panel does not include following  reported medications:  Fluticasone (Flonase)  Gabapentin  Ibuprofen  Linaclotide (Linzess)  Multivitamin  Naloxone (Narcan)  Omega-3 Fatty Acids (Fish Oil)  Rabeprazole (Aciphex)  Sucralfate (Carafate)  Topical (Lidex)  Vortioxetine  (Trintellix) ==================================================================== For clinical consultation, please call [phone]. ====================================================================    UDS interpretation: Compliant          Medication Assessment Form: Reviewed. Patient indicates being compliant with therapy Treatment compliance: Compliant Risk Assessment Profile: Aberrant behavior: See prior evaluations. None observed or detected today Comorbid factors increasing risk of overdose: See prior notes. No additional risks detected today Opioid risk tool (ORT) (Total Score): 7 Personal History of Substance Abuse (SUD-Substance use disorder):  Alcohol: Negative  Illegal Drugs: Negative  Rx Drugs: Negative  ORT Risk Level calculation: Moderate Risk Risk of substance use disorder (SUD): Moderate Opioid Risk Tool - 11/12/18 1406      Family History of Substance Abuse   Alcohol  Negative    Illegal Drugs  Positive Female    Rx Drugs  Positive Female or Female      Personal History of Substance Abuse   Alcohol  Negative    Illegal Drugs  Negative    Rx Drugs  Negative      Age   Age between 74-45 years   No      History of Preadolescent Sexual Abuse   History of Preadolescent Sexual Abuse  Negative or Female      Psychological Disease   Psychological Disease  Negative    Depression  Positive      Total Score   Opioid Risk Tool Scoring  7    Opioid Risk Interpretation  Moderate Risk      ORT Scoring interpretation table:  Score <3 = Low Risk for SUD  Score between 4-7 = Moderate Risk for SUD  Score >8 = High Risk for Opioid Abuse   Risk Mitigation Strategies:  Patient Counseling: Covered Patient-Prescriber Agreement (PPA): Present and active  Notification to other healthcare providers: Done  Pharmacologic Plan: No change in therapy, at this time.             Post-Procedure Assessment  10/24/2018 procedure: Bilateral cervical facet nerve  block Pre-procedure pain score:  2/10 Post-procedure pain score: 0/10         Influential Factors: BMI: 27.98 kg/m Intra-procedural challenges: None observed.         Assessment challenges: None detected.              Reported side-effects: None.        Post-procedural adverse reactions or complications: None reported         Sedation: Please see nurses note. When no sedatives are used, the analgesic levels obtained are directly associated to the effectiveness of the local anesthetics. However, when sedation is provided, the level of analgesia obtained during the initial 1 hour following the intervention, is believed to be the result of a combination of factors. These factors may include, but are not limited to: 1. The  effectiveness of the local anesthetics used. 2. The effects of the analgesic(s) and/or anxiolytic(s) used. 3. The degree of discomfort experienced by the patient at the time of the procedure. 4. The patients ability and reliability in recalling and recording the events. 5. The presence and influence of possible secondary gains and/or psychosocial factors. Reported result: Relief experienced during the 1st hour after the procedure: 100 % (Ultra-Short Term Relief)            Interpretative annotation: Clinically appropriate result. Analgesia during this period is likely to be Local Anesthetic and/or IV Sedative (Analgesic/Anxiolytic) related.          Effects of local anesthetic: The analgesic effects attained during this period are directly associated to the localized infiltration of local anesthetics and therefore cary significant diagnostic value as to the etiological location, or anatomical origin, of the pain. Expected duration of relief is directly dependent on the pharmacodynamics of the local anesthetic used. Long-acting (4-6 hours) anesthetics used.  Reported result: Relief during the next 4 to 6 hour after the procedure: 100 % (Short-Term Relief)            Interpretative  annotation: Clinically appropriate result. Analgesia during this period is likely to be Local Anesthetic-related.          Long-term benefit: Defined as the period of time past the expected duration of local anesthetics (1 hour for short-acting and 4-6 hours for long-acting). With the possible exception of prolonged sympathetic blockade from the local anesthetics, benefits during this period are typically attributed to, or associated with, other factors such as analgesic sensory neuropraxia, antiinflammatory effects, or beneficial biochemical changes provided by agents other than the local anesthetics.  Reported result: Extended relief following procedure: 50 %(2 weeks) (Long-Term Relief)            Interpretative annotation: Clinically possible results. Good relief. No permanent benefit expected. Inflammation plays a part in the etiology to the pain.          Current benefits: Defined as reported results that persistent at this point in time.   Analgesia: 50 %            Function: Nicole Walter reports improvement in function ROM: Nicole Walter reports improvement in ROM Interpretative annotation: Recurrence of symptoms. No permanent benefit expected. Effective diagnostic intervention.         She admits that the pain has slowly started back.   Interpretation: Results would suggest a successful diagnostic intervention.                  Plan:  Please see "Plan of Care" for details.                Laboratory Chemistry  Inflammation Markers (CRP: Acute Phase) (ESR: Chronic Phase) Lab Results  Component Value Date   CRP 2.1 (H) 07/20/2016   ESRSEDRATE 2 05/15/2017                         Rheumatology Markers Lab Results  Component Value Date   RF <10.0 08/31/2016                        Renal Function Markers Lab Results  Component Value Date   BUN 13 10/08/2018   CREATININE 0.74 10/08/2018   GFRAA >60 09/05/2018   GFRNONAA >60 09/05/2018  Hepatic Function  Markers Lab Results  Component Value Date   AST 26 10/08/2018   ALT 31 10/08/2018   ALBUMIN 4.3 10/08/2018   ALKPHOS 86 10/08/2018   HCVAB NEGATIVE 02/14/2017   LIPASE 44 09/05/2018                        Electrolytes Lab Results  Component Value Date   NA 140 10/08/2018   K 4.0 10/08/2018   CL 103 10/08/2018   CALCIUM 9.5 10/08/2018   MG 2.2 05/15/2017                        Neuropathy Markers Lab Results  Component Value Date   VITAMINB12 987 (H) 07/20/2016   HGBA1C 5.7 10/08/2018                        CNS Tests No results found for: COLORCSF, APPEARCSF, RBCCOUNTCSF, WBCCSF, POLYSCSF, LYMPHSCSF, EOSCSF, PROTEINCSF, GLUCCSF, JCVIRUS, CSFOLI, IGGCSF                      Bone Pathology Markers Lab Results  Component Value Date   VD25OH 51.75 10/11/2018   25OHVITD1 58 07/20/2016   25OHVITD2 <1.0 07/20/2016   25OHVITD3 58 07/20/2016                         Coagulation Parameters Lab Results  Component Value Date   PLT 366.0 10/08/2018                        Cardiovascular Markers Lab Results  Component Value Date   TROPONINI <0.03 09/05/2018   HGB 13.3 10/08/2018   HCT 39.5 10/08/2018                         CA Markers No results found for: CEA, CA125, LABCA2                      Note: Lab results reviewed.  Recent Diagnostic Imaging Results  DG Bone Density EXAM: DUAL X-RAY ABSORPTIOMETRY (DXA) FOR BONE MINERAL DENSITY  IMPRESSION: Technologist:: VLM  Your patient Nicole Walter completed a BMD test on 10/31/2018 using the [place] (analysis version: 14.10) manufactured by [employer]. The following summarizes the results of our evaluation.  PATIENT BIOGRAPHICAL: Name: Nicole Walter, Nicole Walter Patient ID: [id number] Birth Date: [birth year] Height: 63.5 in. Gender: Female Exam Date: 10/31/2018 Weight: 163.0 lbs. Indications: Caucasian, Height Loss, Osteoarthritis, Osteopenia, Postmenopausal Fractures: Treatments: Vitamin  D  ASSESSMENT: The BMD measured at Femur Neck Right is 0.733 g/cm2 with a T-score of -2.2. This patient is considered osteopenic according to [facility]) criteria. The scan quality is good.  Site Region Measured Measured WHO Young Adult BMD Date       Age      Classification T-score AP Spine L1-L4 10/31/2018 56.5 Normal -0.4 1.148 g/cm2  DualFemur Neck Right 10/31/2018 56.5 Osteopenia -2.2 0.733 g/cm2 DualFemur Neck Right 10/20/2014 52.5 Osteopenia -1.7 0.798 g/cm2  DualFemur Total Mean 10/31/2018 56.5 Osteopenia -1.2 0.856 g/cm2 DualFemur Total Mean 10/20/2014 52.5 Normal -0.7 0.925 g/cm2  World Health Organization [facility]) criteria for post-menopausal, Caucasian Women: Normal:       T-score at or above -1 SD Osteopenia:   T-score between -1 and -2.5 SD Osteoporosis: T-score at or below -  2.5 SD  RECOMMENDATIONS: 1. All patients should optimize calcium and vitamin D intake. 2. Consider FDA-approved medical therapies in postmenopausal women and men aged 20 years and older, based on the following: a. A hip or vertebral(clinical or morphometric) fracture b. T-score < -2.5 at the femoral neck or spine after appropriate evaluation to exclude secondary causes c. Low bone mass (T-score between -1.0 and -2.5 at the femoral neck or spine) and a 10-year probability of a hip fracture > 3% or a 10-year probability of a major osteoporosis-related fracture > 20% based on the [place]-adapted WHO algorithm d. Clinician judgment and/or patient preferences may indicate treatment for people with 10-year fracture probabilities above or below these levels  FOLLOW-UP: People with diagnosed cases of osteoporosis or at high risk for fracture should have regular bone mineral density tests. For patients eligible for Medicare, routine testing is allowed once every 2 years. The testing frequency can be increased to one year for patients who have rapidly progressing disease, those who  are receiving or discontinuing medical therapy to restore bone mass, or have additional risk factors.  I have reviewed this report, and agree with the above findings.  [facility] Radiologist,  Your patient Nicole Walter completed a FRAX assessment on 10/31/2018 using the [place] (analysis version: 14.10) manufactured by [employer]. The following summarizes the results of our evaluation.  PATIENT BIOGRAPHICAL: Name: Nicole Walter, Nicole Walter Patient ID: [id number] Birth Date: 07/08/1961 Height:    63.5 in. Gender:     Female    [age]       Weight:    163.0 lbs. Ethnicity:  White                            Exam Date: 10/31/2018  FRAX* RESULTS:  (version: 3.5) 10-year Probability of Fracture1 Major Osteoporotic Fracture2 Hip Fracture 8.8% 1.3% Population: [place] (Caucasian) Risk Factors: None  Based on Femur (Right) Neck BMD  1 -The 10-year probability of fracture may be lower than reported if the patient has received treatment. 2 -Major Osteoporotic Fracture: Clinical Spine, Forearm, Hip or Shoulder  *FRAX is a Materials engineer of the [employer] for Metabolic Bone Disease, a [place] (WHO) [facility].  ASSESSMENT: The probability of a major osteoporotic fracture is 8.8% within the next ten years.  The probability of a hip fracture is 1.3% within the next ten years.  .  Electronically Signed   By: Lowella Grip III M.D.   On: 10/31/2018 14:33  Complexity Note: Imaging results reviewed. Results shared with Ms. Maxfield, using Layman's terms.                         Meds   Current Outpatient Medications:  .  cholecalciferol (VITAMIN D3) 25 MCG (1000 UT) tablet, Take 1,000 Units by mouth daily., Disp: , Rfl:  .  dicyclomine (BENTYL) 20 MG tablet, TK 1 T PO TID, Disp: , Rfl: 2 .  fluocinonide cream (LIDEX) 8.11 %, Apply 1 application topically 2 (two) times daily., Disp: 30 g, Rfl: 2 .   fluticasone (FLONASE) 50 MCG/ACT nasal spray, instill 2 sprays into each nostril once daily, Disp: 16 g, Rfl: 11 .  LINZESS 72 MCG capsule, TK 1 C PO QD. TAKE 30 MINUTES BEFORE BREAKFAST, Disp: , Rfl: 3 .  loratadine (CLARITIN) 10 MG tablet, Take 1 tablet (10  mg total) by mouth daily., Disp: 30 tablet, Rfl: 0 .  LORazepam (ATIVAN) 1 MG tablet, TAKE 1 TABLET BY MOUTH TWICE DAILY AS NEEDED FOR ANXIETY. DO NOT DRINK ALCOHOL, Disp: 60 tablet, Rfl: 1 .  Multiple Vitamin (MULTI-VITAMINS) TABS, Take 1 tablet by mouth daily. , Disp: , Rfl:  .  naloxone (NARCAN) nasal spray 4 mg/0.1 mL, Spray into one nostril. Repeat with second device into other nostril after 2-3 minutes if no or minimal response. Use in case of opioid overdose., Disp: 1 kit, Rfl: 0 .  Omega-3 Fatty Acids (FISH OIL) 1200 MG CAPS, Take by mouth., Disp: , Rfl:  .  [START ON 02/02/2019] oxyCODONE (OXY IR/ROXICODONE) 5 MG immediate release tablet, Take 1 tablet (5 mg total) by mouth every 6 (six) hours as needed for severe pain., Disp: 120 tablet, Rfl: 0 .  RABEprazole (ACIPHEX) 20 MG tablet, Take 20 mg by mouth as needed. , Disp: , Rfl:  .  sucralfate (CARAFATE) 1 g tablet, Take by mouth daily. , Disp: , Rfl: 0 .  TRINTELLIX 10 MG TABS tablet, TAKE 1 TABLET BY MOUTH DAILY, Disp: 90 tablet, Rfl: 1 .  Naproxen-Esomeprazole (VIMOVO) 500-20 MG TBEC, Take 500 mg by mouth 2 (two) times daily., Disp: 60 tablet, Rfl: 2 .  [START ON 01/03/2019] oxyCODONE (OXY IR/ROXICODONE) 5 MG immediate release tablet, Take 1 tablet (5 mg total) by mouth every 6 (six) hours as needed for severe pain., Disp: 120 tablet, Rfl: 0 .  [START ON 12/04/2018] oxyCODONE (OXY IR/ROXICODONE) 5 MG immediate release tablet, Take 1 tablet (5 mg total) by mouth every 6 (six) hours as needed for severe pain., Disp: 120 tablet, Rfl: 0  ROS  Constitutional: Denies any fever or chills Gastrointestinal: No reported hemesis, hematochezia, vomiting, or acute GI distress Musculoskeletal:  Denies any acute onset joint swelling, redness, loss of ROM, or weakness Neurological: No reported episodes of acute onset apraxia, aphasia, dysarthria, agnosia, amnesia, paralysis, loss of coordination, or loss of consciousness  Allergies  Nicole Walter is allergic to other; food; and pea.  PFSH  Drug: Nicole Walter  reports no history of drug use. Alcohol:  reports no history of alcohol use. Tobacco:  reports that she has been smoking e-cigarettes. She started smoking about 44 years ago. She has a 35.00 pack-year smoking history. She uses smokeless tobacco. Medical:  has a past medical history of Abnormal liver enzymes (02/08/2016), Allergy, Anxiety, Chest pain (07/05/2016), Chronic pain, Depression, DJD (degenerative joint disease), Fibromyalgia, High cholesterol, Rash of back (01/28/2016), and Weight loss due to medication. Surgical: Nicole Walter  has a past surgical history that includes Cholecystectomy; Tubal ligation; Ankle surgery; Nasal sinus surgery; Tonsillectomy; Colonoscopy; Esophagogastroduodenoscopy (egd) with propofol (N/A, 02/22/2016); Colonoscopy with propofol (N/A, 02/22/2016); and Breast biopsy (Left, 1997). Family: family history includes Arthritis in her father, paternal grandfather, and paternal grandmother; Cancer in her brother and father; Dementia in her sister; Diabetes in her sister; Drug abuse in her brother, brother, and sister; Heart disease in her mother; Post-traumatic stress disorder in her brother; Stroke in her mother.  Constitutional Exam  General appearance: Well nourished, well developed, and well hydrated. In no apparent acute distress Vitals:   11/12/18 1356  BP: 122/77  Pulse: 68  Resp: 16  Temp: 97.9 Walter (36.6 C)  SpO2: 99%  Weight: 163 lb (73.9 kg)  Height: '5\' 4"'  (1.626 m)  Psych/Mental status: Alert, oriented x 3 (person, place, & time)       Eyes: PERLA Respiratory: No  evidence of acute respiratory distress  Cervical Spine Area Exam  Skin & Axial Inspection:  No masses, redness, edema, swelling, or associated skin lesions Alignment: Symmetrical Functional ROM: Unrestricted ROM      Stability: No instability detected Muscle Tone/Strength: Functionally intact. No obvious neuro-muscular anomalies detected. Sensory (Neurological): Unimpaired Palpation: No palpable anomalies              Upper Extremity (UE) Exam    Side: Right upper extremity  Side: Left upper extremity  Skin & Extremity Inspection: Skin color, temperature, and hair growth are WNL. No peripheral edema or cyanosis. No masses, redness, swelling, asymmetry, or associated skin lesions. No contractures.  Skin & Extremity Inspection: Skin color, temperature, and hair growth are WNL. No peripheral edema or cyanosis. No masses, redness, swelling, asymmetry, or associated skin lesions. No contractures.  Functional ROM: Unrestricted ROM          Functional ROM: Unrestricted ROM          Muscle Tone/Strength: Functionally intact. No obvious neuro-muscular anomalies detected.  Muscle Tone/Strength: Functionally intact. No obvious neuro-muscular anomalies detected.  Sensory (Neurological): Unimpaired          Sensory (Neurological): Unimpaired          Palpation: No palpable anomalies              Palpation: No palpable anomalies                   Thoracic Spine Area Exam  Skin & Axial Inspection: No masses, redness, or swelling Alignment: Symmetrical Functional ROM: Unrestricted ROM Stability: No instability detected Muscle Tone/Strength: Functionally intact. No obvious neuro-muscular anomalies detected. Sensory (Neurological): Unimpaired Muscle strength & Tone: No palpable anomalies  Lumbar Spine Area Exam  Skin & Axial Inspection: No masses, redness, or swelling Alignment: Symmetrical Functional ROM: Unrestricted ROM       Stability: No instability detected Muscle Tone/Strength: Functionally intact. No obvious neuro-muscular anomalies detected. Sensory (Neurological):  Unimpaired Palpation: No palpable anomalies       Provocative Tests: Hyperextension/rotation test: deferred today       Lumbar quadrant test (Kemp's test): deferred today       Lateral bending test: deferred today       Patrick's Maneuver: deferred today                    Gait & Posture Assessment  Ambulation: Unassisted Gait: Relatively normal for age and body habitus Posture: WNL   Lower Extremity Exam    Side: Right lower extremity  Side: Left lower extremity  Stability: No instability observed          Stability: No instability observed          Skin & Extremity Inspection: Skin color, temperature, and hair growth are WNL. No peripheral edema or cyanosis. No masses, redness, swelling, asymmetry, or associated skin lesions. No contractures.  Skin & Extremity Inspection: Skin color, temperature, and hair growth are WNL. No peripheral edema or cyanosis. No masses, redness, swelling, asymmetry, or associated skin lesions. No contractures.  Functional ROM: Unrestricted ROM                  Functional ROM: Unrestricted ROM                  Muscle Tone/Strength: Functionally intact. No obvious neuro-muscular anomalies detected.  Muscle Tone/Strength: Functionally intact. No obvious neuro-muscular anomalies detected.  Sensory (Neurological): Unimpaired  Sensory (Neurological): Unimpaired                 Assessment  Primary Diagnosis & Pertinent Problem List: The primary encounter diagnosis was Lumbar spondylosis. Diagnoses of Cervical spondylosis (Bilateral), Chronic pain syndrome, and Fibromyalgia were also pertinent to this visit.  Status Diagnosis  Controlled Controlled Controlled 1. Lumbar spondylosis   2. Cervical spondylosis (Bilateral)   3. Chronic pain syndrome   4. Fibromyalgia     Problems updated and reviewed during this visit: No problems updated. Plan of Care  Pharmacotherapy (Medications Ordered): Meds ordered this encounter  Medications  . oxyCODONE (OXY  IR/ROXICODONE) 5 MG immediate release tablet    Sig: Take 1 tablet (5 mg total) by mouth every 6 (six) hours as needed for severe pain.    Dispense:  120 tablet    Refill:  0    Do not place this medication, or any other prescription from our practice, on "Automatic Refill". Patient may have prescription filled one day early if pharmacy is closed on scheduled refill date.    Order Specific Question:   Supervising Provider    Answer:   Milinda Pointer [phone]  . oxyCODONE (OXY IR/ROXICODONE) 5 MG immediate release tablet    Sig: Take 1 tablet (5 mg total) by mouth every 6 (six) hours as needed for severe pain.    Dispense:  120 tablet    Refill:  0    Do not place this medication, or any other prescription from our practice, on "Automatic Refill". Patient may have prescription filled one day early if pharmacy is closed on scheduled refill date.    Order Specific Question:   Supervising Provider    Answer:   Milinda Pointer [phone]  . oxyCODONE (OXY IR/ROXICODONE) 5 MG immediate release tablet    Sig: Take 1 tablet (5 mg total) by mouth every 6 (six) hours as needed for severe pain.    Dispense:  120 tablet    Refill:  0    Do not place this medication, or any other prescription from our practice, on "Automatic Refill". Patient may have prescription filled one day early if pharmacy is closed on scheduled refill date.    Order Specific Question:   Supervising Provider    Answer:   Milinda Pointer [phone]  . Naproxen-Esomeprazole (VIMOVO) 500-20 MG TBEC    Sig: Take 500 mg by mouth 2 (two) times daily.    Dispense:  60 tablet    Refill:  2    Order Specific Question:   Supervising Provider    Answer:   Milinda Pointer [phone]   New Prescriptions   NAPROXEN-ESOMEPRAZOLE (VIMOVO) 500-20 MG TBEC    Take 500 mg by mouth 2 (two) times daily.   Medications administered today: Ashia Dehner had no medications administered during this visit. Lab-work, procedure(s), and/or  referral(s): No orders of the defined types were placed in this encounter.  Imaging and/or referral(s): None  Interventional therapies:  Planned, scheduled, and/or pending:      Considering:   Diagnostic bilateral cervical facet block Possible bilateral cervical facet radiofrequencyablation Diagnostic left-sided cervical epidural steroid injection Diagnostic bilateral intra-articular shoulder joint injection Diagnostic bilateral suprascapular nerve block Possible bilateral suprascapular nerve radiofrequencyablation Diagnostic bilateral lumbar facet block Possible bilateral lumbar facet radiofrequencyablation  Diagnostic bilateral intra-articular hip joint injection Diagnostic bilateral femoral and obturator tickler branch blocks Possible bilateral hip joint radiofrequencyablation  Diagnosticright intra-articular knee joint injection Possible series of 5, right-sided, intra-articular knee joint injections with Hyalgan.  Possible right sided genicular nerve block Possible right sided genicular nerve radiofrequencyablation Possible left-sided lumbar epidural steroid injection   Palliative PRN treatment(s):   Palliativebilateral suprascapular nerve block  Palliativeright intra-articular knee joint injection     Provider-requested follow-up: Return in about 3 months (around 02/11/2019) for MedMgmt.  Future Appointments  Date Time Provider [place]  11/28/2018  8:10 AM [facility]  12/03/2018  3:00 PM Jerl Mina, PT [facility] None  02/17/2019 10:30 AM Vevelyn Francois, NP [facility] None  02/21/2019 10:00 AM Leone Haven, MD [facility]  10/24/2019 10:00 AM Harlin Heys, MD [facility] None   Primary Care Physician: Leone Haven, MD Location: [facility] Note by: Vevelyn Francois NP Date: 11/12/2018; Time: 3:02 PM  Pain Score Disclaimer: We use the NRS-11 scale. This is a self-reported,  subjective measurement of pain severity with only modest accuracy. It is used primarily to identify changes within a particular patient. It must be understood that outpatient pain scales are significantly less accurate that those used for research, where they can be applied under ideal controlled circumstances with minimal exposure to variables. In reality, the score is likely to be a combination of pain intensity and pain affect, where pain affect describes the degree of emotional arousal or changes in action readiness caused by the sensory experience of pain. Factors such as social and work situation, setting, emotional state, anxiety levels, expectation, and prior pain experience may influence pain perception and show large inter-individual differences that may also be affected by time variables.  Patient instructions provided during this appointment: Patient Instructions  ____________________________________________________________________________________________  Medication Rules  Purpose: To inform patients, and their family members, of our rules and regulations.  Applies to: All patients receiving prescriptions (written or electronic).  Pharmacy of record: Pharmacy where electronic prescriptions will be sent. If written prescriptions are taken to a different pharmacy, please inform the nursing staff. The pharmacy listed in the electronic medical record should be the one where you would like electronic prescriptions to be sent.  Electronic prescriptions: In compliance with the [place] (STOP) Act of 2017 (Session Lanny Cramp [phone]), effective November 06, 2018, all controlled substances must be electronically prescribed. Calling prescriptions to the pharmacy will cease to exist.  Prescription refills: Only during scheduled appointments. Applies to all prescriptions.  NOTE: The following applies primarily to controlled substances (Opioid* Pain  Medications).   Patient's responsibilities: 1. Pain Pills: Bring all pain pills to every appointment (except for procedure appointments). 2. Pill Bottles: Bring pills in original pharmacy bottle. Always bring the newest bottle. Bring bottle, even if empty. 3. Medication refills: You are responsible for knowing and keeping track of what medications you take and those you need refilled. The day before your appointment: write a list of all prescriptions that need to be refilled. The day of the appointment: give the list to the admitting nurse. Prescriptions will be written only during appointments. If you forget a medication: it will not be "Called in", "Faxed", or "electronically sent". You will need to get another appointment to get these prescribed. No early refills. Do not call asking to have your prescription filled early. 4. Prescription Accuracy: You are responsible for carefully inspecting your prescriptions before leaving our office. Have the discharge nurse carefully go over each prescription with you, before taking them home. Make sure that your name is accurately spelled, that your address is correct. Check the name and dose of your medication  to make sure it is accurate. Check the number of pills, and the written instructions to make sure they are clear and accurate. Make sure that you are given enough medication to last until your next medication refill appointment. 5. Taking Medication: Take medication as prescribed. When it comes to controlled substances, taking less pills or less frequently than prescribed is permitted and encouraged. Never take more pills than instructed. Never take medication more frequently than prescribed.  6. Inform other Doctors: Always inform, all of your healthcare providers, of all the medications you take. 7. Pain Medication from other Providers: You are not allowed to accept any additional pain medication from any other Doctor or Healthcare provider. There are  two exceptions to this rule. (see below) In the event that you require additional pain medication, you are responsible for notifying us, as stated below. 8. Medication Agreement: You are responsible for carefully reading and following our Medication Agreement. This must be signed before receiving any prescriptions from our practice. Safely store a copy of your signed Agreement. Violations to the Agreement will result in no further prescriptions. (Additional copies of our Medication Agreement are available upon request.) 9. Laws, Rules, & Regulations: All patients are expected to follow all Federal and [employer], [employer], Rules, [employer]. Ignorance of the Laws does not constitute a valid excuse. The use of any illegal substances is prohibited. 10. Adopted CDC guidelines & recommendations: Target dosing levels will be at or below 60 MME/day. Use of benzodiazepines** is not recommended.  Exceptions: There are only two exceptions to the rule of not receiving pain medications from other Healthcare Providers. 1. Exception #1 (Emergencies): In the event of an emergency (i.e.: accident requiring emergency care), you are allowed to receive additional pain medication. However, you are responsible for: As soon as you are able, call our office [phone], at any time of the day or night, and leave a message stating your name, the date and nature of the emergency, and the name and dose of the medication prescribed. In the event that your call is answered by a member of our staff, make sure to document and save the date, time, and the name of the person that took your information.  2. Exception #2 (Planned Surgery): In the event that you are scheduled by another doctor or dentist to have any type of surgery or procedure, you are allowed (for a period no longer than 30 days), to receive additional pain medication, for the acute post-op pain. However, in this case, you are responsible for picking up a copy of our  "Post-op Pain Management for Surgeons" handout, and giving it to your surgeon or dentist. This document is available at our office, and does not require an appointment to obtain it. Simply go to our office during business hours (Monday-Thursday from 8:00 AM to 4:00 PM) (Friday 8:00 AM to 12:00 Noon) or if you have a scheduled appointment with [place], prior to your surgery, and ask for it by name. In addition, you will need to provide [place] with your name, name of your surgeon, type of surgery, and date of procedure or surgery.  *Opioid medications include: morphine, codeine, oxycodone, oxymorphone, hydrocodone, hydromorphone, meperidine, tramadol, tapentadol, buprenorphine, fentanyl, methadone. **Benzodiazepine medications include: diazepam (Valium), alprazolam (Xanax), clonazepam (Klonopine), lorazepam (Ativan), clorazepate (Tranxene), chlordiazepoxide (Librium), estazolam (Prosom), oxazepam (Serax), temazepam (Restoril), triazolam (Halcion) (Last updated: 01/03/2018) ____________________________________________________________________________________________   ____________________________________________________________________________________________  CANNABIDIOL (AKA: CBD Oil or Pills)  Applies to: All patients receiving prescriptions of controlled substances (  written and/or electronic).  General Information: Cannabidiol (CBD) was discovered in 60. It is one of some 113 identified cannabinoids in cannabis (Marijuana) plants, accounting for up to 40% of the plant's extract. As of 2018, preliminary clinical research on cannabidiol included studies of anxiety, cognition, movement disorders, and pain.  Cannabidiol is consummed in multiple ways, including inhalation of cannabis smoke or vapor, as an aerosol spray into the cheek, and by mouth. It may be supplied as CBD oil containing CBD as the active ingredient (no added tetrahydrocannabinol (THC) or terpenes), a full-plant CBD-dominant hemp extract oil,  capsules, dried cannabis, or as a liquid solution. CBD is thought not have the same psychoactivity as THC, and may affect the actions of THC. Studies suggest that CBD may interact with different biological targets, including cannabinoid receptors and other neurotransmitter receptors. As of 2018 the mechanism of action for its biological effects has not been determined.  In the [place], cannabidiol has a limited approval by the Food and Drug Administration (FDA) for treatment of only two types of epilepsy disorders. The side effects of long-term use of the drug include somnolence, decreased appetite, diarrhea, fatigue, malaise, weakness, sleeping problems, and others.  CBD remains a Schedule I drug prohibited for any use.  Legality: Some manufacturers ship CBD products nationally, an illegal action which the FDA has not enforced in 2018, with CBD remaining the subject of an FDA investigational new drug evaluation, and is not considered legal as a dietary supplement or food ingredient as of December 2018. Federal illegality has made it difficult historically to conduct research on CBD. CBD is openly sold in head shops and health food stores in some states where such sales have not been explicitly legalized.  Warning: Because it is not FDA approved for general use or treatment of pain, it is not required to undergo the same manufacturing controls as prescription drugs.  This means that the available cannabidiol (CBD) may be contaminated with THC.  If this is the case, it will trigger a positive urine drug screen (UDS) test for cannabinoids (Marijuana).  Because a positive UDS for illicit substances is a violation of our medication agreement, your opioid analgesics (pain medicine) may be permanently discontinued. (Last update: 01/24/2018) ____________________________________________________________________________________________

## 2018-11-12 NOTE — Patient Instructions (Signed)
____________________________________________________________________________________________  Medication Rules  Purpose: To inform patients, and their family members, of our rules and regulations.  Applies to: All patients receiving prescriptions (written or electronic).  Pharmacy of record: Pharmacy where electronic prescriptions will be sent. If written prescriptions are taken to a different pharmacy, please inform the nursing staff. The pharmacy listed in the electronic medical record should be the one where you would like electronic prescriptions to be sent.  Electronic prescriptions: In compliance with the [place] Strengthen Opioid Misuse Prevention (STOP) Act of 2017 (Session Law 2017-74/H243), effective November 06, 2018, all controlled substances must be electronically prescribed. Calling prescriptions to the pharmacy will cease to exist.  Prescription refills: Only during scheduled appointments. Applies to all prescriptions.  NOTE: The following applies primarily to controlled substances (Opioid* Pain Medications).   Patient's responsibilities: 1. Pain Pills: Bring all pain pills to every appointment (except for procedure appointments). 2. Pill Bottles: Bring pills in original pharmacy bottle. Always bring the newest bottle. Bring bottle, even if empty. 3. Medication refills: You are responsible for knowing and keeping track of what medications you take and those you need refilled. The day before your appointment: write a list of all prescriptions that need to be refilled. The day of the appointment: give the list to the admitting nurse. Prescriptions will be written only during appointments. If you forget a medication: it will not be "Called in", "Faxed", or "electronically sent". You will need to get another appointment to get these prescribed. No early refills. Do not call asking to have your prescription filled early. 4. Prescription Accuracy: You are responsible for  carefully inspecting your prescriptions before leaving our office. Have the discharge nurse carefully go over each prescription with you, before taking them home. Make sure that your name is accurately spelled, that your address is correct. Check the name and dose of your medication to make sure it is accurate. Check the number of pills, and the written instructions to make sure they are clear and accurate. Make sure that you are given enough medication to last until your next medication refill appointment. 5. Taking Medication: Take medication as prescribed. When it comes to controlled substances, taking less pills or less frequently than prescribed is permitted and encouraged. Never take more pills than instructed. Never take medication more frequently than prescribed.  6. Inform other Doctors: Always inform, all of your healthcare providers, of all the medications you take. 7. Pain Medication from other Providers: You are not allowed to accept any additional pain medication from any other Doctor or Healthcare provider. There are two exceptions to this rule. (see below) In the event that you require additional pain medication, you are responsible for notifying us, as stated below. 8. Medication Agreement: You are responsible for carefully reading and following our Medication Agreement. This must be signed before receiving any prescriptions from our practice. Safely store a copy of your signed Agreement. Violations to the Agreement will result in no further prescriptions. (Additional copies of our Medication Agreement are available upon request.) 9. Laws, Rules, & Regulations: All patients are expected to follow all Federal and State Laws, Statutes, Rules, & Regulations. Ignorance of the Laws does not constitute a valid excuse. The use of any illegal substances is prohibited. 10. Adopted CDC guidelines & recommendations: Target dosing levels will be at or below 60 MME/day. Use of benzodiazepines** is not  recommended.  Exceptions: There are only two exceptions to the rule of not receiving pain medications from other Healthcare Providers. 1.   Exception #1 (Emergencies): In the event of an emergency (i.e.: accident requiring emergency care), you are allowed to receive additional pain medication. However, you are responsible for: As soon as you are able, call our office [phone], at any time of the day or night, and leave a message stating your name, the date and nature of the emergency, and the name and dose of the medication prescribed. In the event that your call is answered by a member of our staff, make sure to document and save the date, time, and the name of the person that took your information.  2. Exception #2 (Planned Surgery): In the event that you are scheduled by another doctor or dentist to have any type of surgery or procedure, you are allowed (for a period no longer than 30 days), to receive additional pain medication, for the acute post-op pain. However, in this case, you are responsible for picking up a copy of our "Post-op Pain Management for Surgeons" handout, and giving it to your surgeon or dentist. This document is available at our office, and does not require an appointment to obtain it. Simply go to our office during business hours (Monday-Thursday from 8:00 AM to 4:00 PM) (Friday 8:00 AM to 12:00 Noon) or if you have a scheduled appointment with [place], prior to your surgery, and ask for it by name. In addition, you will need to provide [place] with your name, name of your surgeon, type of surgery, and date of procedure or surgery.  *Opioid medications include: morphine, codeine, oxycodone, oxymorphone, hydrocodone, hydromorphone, meperidine, tramadol, tapentadol, buprenorphine, fentanyl, methadone. **Benzodiazepine medications include: diazepam (Valium), alprazolam (Xanax), clonazepam (Klonopine), lorazepam (Ativan), clorazepate (Tranxene), chlordiazepoxide (Librium), estazolam (Prosom),  oxazepam (Serax), temazepam (Restoril), triazolam (Halcion) (Last updated: 01/03/2018) ____________________________________________________________________________________________   ____________________________________________________________________________________________  CANNABIDIOL (AKA: CBD Oil or Pills)  Applies to: All patients receiving prescriptions of controlled substances (written and/or electronic).  General Information: Cannabidiol (CBD) was discovered in 64. It is one of some 113 identified cannabinoids in cannabis (Marijuana) plants, accounting for up to 40% of the plant's extract. As of 2018, preliminary clinical research on cannabidiol included studies of anxiety, cognition, movement disorders, and pain.  Cannabidiol is consummed in multiple ways, including inhalation of cannabis smoke or vapor, as an aerosol spray into the cheek, and by mouth. It may be supplied as CBD oil containing CBD as the active ingredient (no added tetrahydrocannabinol (THC) or terpenes), a full-plant CBD-dominant hemp extract oil, capsules, dried cannabis, or as a liquid solution. CBD is thought not have the same psychoactivity as THC, and may affect the actions of THC. Studies suggest that CBD may interact with different biological targets, including cannabinoid receptors and other neurotransmitter receptors. As of 2018 the mechanism of action for its biological effects has not been determined.  In the [place], cannabidiol has a limited approval by the Food and Drug Administration (FDA) for treatment of only two types of epilepsy disorders. The side effects of long-term use of the drug include somnolence, decreased appetite, diarrhea, fatigue, malaise, weakness, sleeping problems, and others.  CBD remains a Schedule I drug prohibited for any use.  Legality: Some manufacturers ship CBD products nationally, an illegal action which the FDA has not enforced in 2018, with CBD remaining the subject of  an FDA investigational new drug evaluation, and is not considered legal as a dietary supplement or food ingredient as of December 2018. Federal illegality has made it difficult historically to conduct research on CBD. CBD is openly sold in  head shops and health food stores in some states where such sales have not been explicitly legalized.  Warning: Because it is not FDA approved for general use or treatment of pain, it is not required to undergo the same manufacturing controls as prescription drugs.  This means that the available cannabidiol (CBD) may be contaminated with THC.  If this is the case, it will trigger a positive urine drug screen (UDS) test for cannabinoids (Marijuana).  Because a positive UDS for illicit substances is a violation of our medication agreement, your opioid analgesics (pain medicine) may be permanently discontinued. (Last update: 01/24/2018) ____________________________________________________________________________________________

## 2018-11-18 ENCOUNTER — Encounter: Payer: Self-pay | Admitting: Physical Therapy

## 2018-11-20 ENCOUNTER — Telehealth: Payer: Self-pay | Admitting: Nurse Practitioner

## 2018-11-20 NOTE — Telephone Encounter (Signed)
OK, no problem 

## 2018-11-20 NOTE — Telephone Encounter (Signed)
error 

## 2018-11-25 ENCOUNTER — Encounter: Payer: Self-pay | Admitting: Physical Therapy

## 2018-11-28 ENCOUNTER — Ambulatory Visit
Admission: RE | Admit: 2018-11-28 | Discharge: 2018-11-28 | Disposition: A | Discharge status: Home / Self Care | Payer: BLUE CROSS/BLUE SHIELD | Source: Ambulatory Visit | Attending: Nurse Practitioner | Admitting: Nurse Practitioner

## 2018-11-28 DIAGNOSIS — Z122 Encounter for screening for malignant neoplasm of respiratory organs: Secondary | ICD-10-CM

## 2018-11-28 DIAGNOSIS — Z87891 Personal history of nicotine dependence: Secondary | ICD-10-CM | POA: Diagnosis present

## 2018-11-29 ENCOUNTER — Encounter: Payer: Self-pay | Admitting: *Deleted

## 2018-12-02 ENCOUNTER — Encounter: Payer: Self-pay | Admitting: Physical Therapy

## 2018-12-03 ENCOUNTER — Encounter: Payer: BLUE CROSS/BLUE SHIELD | Admitting: Physical Therapy

## 2018-12-07 ENCOUNTER — Emergency Department
Admission: EM | Admit: 2018-12-07 | Discharge: 2018-12-07 | Disposition: A | Discharge status: Home / Self Care | Payer: BLUE CROSS/BLUE SHIELD | Attending: Emergency Medicine | Admitting: Emergency Medicine

## 2018-12-07 ENCOUNTER — Emergency Department: Payer: BLUE CROSS/BLUE SHIELD

## 2018-12-07 ENCOUNTER — Other Ambulatory Visit: Payer: Self-pay

## 2018-12-07 ENCOUNTER — Encounter: Payer: Self-pay | Admitting: Emergency Medicine

## 2018-12-07 DIAGNOSIS — F17290 Nicotine dependence, other tobacco product, uncomplicated: Secondary | ICD-10-CM | POA: Diagnosis not present

## 2018-12-07 DIAGNOSIS — R1011 Right upper quadrant pain: Secondary | ICD-10-CM | POA: Insufficient documentation

## 2018-12-07 DIAGNOSIS — R109 Unspecified abdominal pain: Secondary | ICD-10-CM

## 2018-12-07 LAB — URINALYSIS, COMPLETE (UACMP) WITH MICROSCOPIC
Bacteria, UA: NONE SEEN
Bilirubin Urine: NEGATIVE
Glucose, UA: NEGATIVE mg/dL
Hgb urine dipstick: NEGATIVE
Ketones, ur: NEGATIVE mg/dL
Leukocytes, UA: NEGATIVE
Nitrite: NEGATIVE
Protein, ur: NEGATIVE mg/dL
Specific Gravity, Urine: 1.006 (ref 1.005–1.030)
pH: 6 (ref 5.0–8.0)

## 2018-12-07 LAB — COMPREHENSIVE METABOLIC PANEL
ALT: 42 U/L (ref 0–44)
AST: 35 U/L (ref 15–41)
Albumin: 4.6 g/dL (ref 3.5–5.0)
Alkaline Phosphatase: 80 U/L (ref 38–126)
Anion gap: 8 (ref 5–15)
BUN: 10 mg/dL (ref 6–20)
CALCIUM: 9.5 mg/dL (ref 8.9–10.3)
CO2: 24 mmol/L (ref 22–32)
Chloride: 104 mmol/L (ref 98–111)
Creatinine, Ser: 0.7 mg/dL (ref 0.44–1.00)
GFR calc Af Amer: >60 mL/min (ref >60)
GFR calc non Af Amer: >60 mL/min (ref >60)
Glucose, Bld: 173 mg/dL — ABNORMAL HIGH (ref 70–99)
Potassium: 3.5 mmol/L (ref 3.5–5.1)
Sodium: 136 mmol/L (ref 135–145)
Total Bilirubin: 0.7 mg/dL (ref 0.3–1.2)
Total Protein: 7.7 g/dL (ref 6.5–8.1)

## 2018-12-07 LAB — FIBRIN DERIVATIVES D-DIMER (ARMC ONLY): Fibrin derivatives D-dimer (ARMC): 1056.56 ng{FEU}/mL — ABNORMAL HIGH (ref 0.00–499.00)

## 2018-12-07 LAB — CBC
HCT: 41 % (ref 36.0–46.0)
Hemoglobin: 13.6 g/dL (ref 12.0–15.0)
MCH: 29.3 pg (ref 26.0–34.0)
MCHC: 33.2 g/dL (ref 30.0–36.0)
MCV: 88.4 fL (ref 80.0–100.0)
Platelets: 336 K/uL (ref 150–400)
RBC: 4.64 MIL/uL (ref 3.87–5.11)
RDW: 12.8 % (ref 11.5–15.5)
WBC: 8.9 K/uL (ref 4.0–10.5)
nRBC: 0 % (ref 0.0–0.2)

## 2018-12-07 LAB — LIPASE, BLOOD: Lipase: 29 U/L (ref 11–51)

## 2018-12-07 MED ORDER — IOHEXOL 350 MG/ML SOLN
75.0000 mL | Freq: Once | INTRAVENOUS | Status: AC | PRN
  Administered 2018-12-07: 100 mL via INTRAVENOUS

## 2018-12-07 MED ORDER — DIAZEPAM 5 MG PO TABS: 5.0000 mg | ORAL_TABLET | Freq: Three times a day (TID) | ORAL | 0 refills | Status: DC | PRN

## 2018-12-07 MED ORDER — SODIUM CHLORIDE 0.9% FLUSH: 3.0000 mL | Freq: Once | INTRAVENOUS | Status: DC

## 2018-12-07 NOTE — ED Provider Notes (Signed)
[facility] Emergency Department Provider Note       Time seen: ----------------------------------------- 4:31 PM on 12/07/2018 -----------------------------------------   I have reviewed the triage vital signs and the nursing notes.  HISTORY   Chief Complaint Abdominal Pain    HPI Nicole Walter is a 58 y.o. female with a history of chest pain, chronic pain, depression, fibromyalgia, hyperlipidemia who presents to the ED for right upper quadrant abdominal pain that radiates into her back.  Patient reports history of cholecystectomy.  Patient states the pain gets worse when she takes a deep breath or when she sneezes.  She denies fevers, chills or other complaints.  She has had pain for the past week.  Past Medical History:  Diagnosis Date  . Abnormal liver enzymes 02/08/2016  . Allergy   . Anxiety   . Chest pain 07/05/2016  . Chronic pain   . Depression   . DJD (degenerative joint disease)    lumbar  . Fibromyalgia   . High cholesterol   . Rash of back 01/28/2016  . Weight loss due to medication     Patient Active Problem List   Diagnosis Date Noted  . Trypanophobia (Needle phobia) 09/26/2018  . Spondylosis without myelopathy or radiculopathy, lumbar region 08/29/2018  . Lumbar facet hypertrophy 08/29/2018  . Lumbar facet syndrome (Right) 08/29/2018  . DDD (degenerative disc disease), thoracic 08/29/2018  . Thoracic spondylosis with radiculopathy (Right) 08/29/2018  . Spinal enthesopathy of lumbar region ([place]) 08/29/2018  . Chronic midline low back pain without sciatica 08/29/2018  . Generalized anxiety disorder 07/23/2018  . Cervical facet hypertrophy (Bilateral) 07/18/2018  . Cervical central spinal stenosis 07/18/2018  . Chronic shoulder pain (Left) 07/09/2018  . Cervicalgia 03/28/2018  . Spondylosis without myelopathy or radiculopathy, cervical region 02/11/2018  . Chronic pelvic pain in female 08/24/2017  . Fatty liver 08/24/2017  .  Chronic low back pain (Bilateral) (L>R) 08/13/2017  . Cervical Foraminal Stenosis (Severe) (Bilateral: C-5-6) 08/13/2017  . Cervical spondylosis with radiculopathy (Bilateral) (L>R) 08/13/2017  . Cervical sensory radiculopathy at C5 (Bilateral) 08/13/2017  . Radicular pain of shoulder (Bilateral) (C5) 08/13/2017  . Constipation 07/16/2017  . Myofascial pain syndrome, cervical (trapezius) (Left) 05/28/2017  . Cervical facet syndrome (Bilateral) (L>R) 05/28/2017  . Muscle spasm 05/15/2017  . Chronic hip pain (Right) 03/19/2017  . Chronic knee arthropathy (Right) 03/14/2017  . Osteoarthritis of knee (Right) 02/21/2017  . Osteoarthritis of shoulder (Right) 01/31/2017  . Shortness of breath 01/15/2017  . Thoracic spondylosis 01/01/2017  . Thoracic radiculitis (R>L) 12/21/2016  . Osteoarthritis of hip (Bilateral) 11/20/2016  . Greater trochanteric bursitis (Right) 09/18/2016  . Subacromial bursitis of shoulder joint (Right) 09/18/2016  . Overweight (BMI 25.0-29.9) 09/14/2016  . Elevated sedimentation rate 08/31/2016  . Elevated C-reactive protein (CRP) 08/31/2016  . Long term current use of opiate analgesic 07/20/2016  . Long term prescription opiate use 07/20/2016  . Opiate use 07/20/2016  . Chronic upper back pain (Secondary Area of Pain) (Bilateral) (L>R) 07/20/2016  . Chronic shoulder pain [facility] Area of Pain) (Bilateral) (L>R) 07/20/2016  . Chronic knee pain (Right) 07/20/2016  . Chronic upper extremity pain (Left) 07/20/2016  . Chronic lower extremity pain (Bilateral) (L>R) 07/20/2016  . Chronic abdominal pain 07/20/2016  . Chronic hip pain (Bilateral) (R>L) 05/31/2016  . Chronic Greater trochanteric bursitis (Bilateral) (L>R) 05/31/2016  . Bursitis of shoulder (Right) 05/17/2016  . Skin lesions 04/27/2016  . Hyperlipidemia 02/18/2016  . Cervical spondylosis (Bilateral) 02/08/2016  . Fibromyalgia 02/08/2016  .  Osteopenia 02/08/2016  . Chronic neck pain (Primary Area of Pain)  (Bilateral) (L>R) 01/28/2016  . Concentration deficit 01/28/2016  . Routine general medical examination at a health care facility 01/14/2016  . Lumbar spondylosis 01/06/2016  . Anxiety and depression 07/11/2015  . Chronic pain syndrome 07/11/2015  . Combined fat and carbohydrate induced hyperlipemia 07/11/2015    Past Surgical History:  Procedure Laterality Date  . ANKLE SURGERY     x 2  . BREAST BIOPSY Left 1997  . CHOLECYSTECTOMY    . COLONOSCOPY    . COLONOSCOPY WITH PROPOFOL N/A 02/22/2016   Procedure: COLONOSCOPY WITH PROPOFOL;  Surgeon: Lollie Sails, MD;  Location: [facility] ENDOSCOPY;  Service: Endoscopy;  Laterality: N/A;  . ESOPHAGOGASTRODUODENOSCOPY (EGD) WITH PROPOFOL N/A 02/22/2016   Procedure: ESOPHAGOGASTRODUODENOSCOPY (EGD) WITH PROPOFOL;  Surgeon: Lollie Sails, MD;  Location: [facility] ENDOSCOPY;  Service: Endoscopy;  Laterality: N/A;  . NASAL SINUS SURGERY    . TONSILLECTOMY    . TUBAL LIGATION      Allergies Other; Food; and Pea  Social History Social History   Tobacco Use  . Smoking status: Current Every Day Smoker    Packs/day: 1.00    Years: 35.00    Pack years: 35.00    Types: E-cigarettes    Start date: 03/06/1974    Last attempt to quit: 07/08/2015    Years since quitting: 3.4  . Smokeless tobacco: Current User  . Tobacco comment: vapor cigarettes, no nicotene  Substance Use Topics  . Alcohol use: No    Alcohol/week: 0.0 standard drinks  . Drug use: No   Review of Systems Constitutional: Negative for fever. Cardiovascular: Positive for pleuritic chest pain Respiratory: Negative for shortness of breath. Gastrointestinal: Positive for abdominal pain Musculoskeletal: Negative for back pain. Skin: Negative for rash. Neurological: Negative for headaches, focal weakness or numbness.  All systems negative/normal/unremarkable except as stated in the HPI  ____________________________________________   PHYSICAL EXAM:  VITAL SIGNS: ED Triage  Vitals  Enc Vitals Group     BP 12/07/18 1355 128/77     Pulse Rate 12/07/18 1355 86     Resp 12/07/18 1355 16     Temp 12/07/18 1355 98.5 F (36.9 C)     Temp Source 12/07/18 1355 Oral     SpO2 12/07/18 1355 95 %     Weight 12/07/18 1356 167 lb (75.8 kg)     Height 12/07/18 1356 5\' 4"  (1.626 m)     Head Circumference --      Peak Flow --      Pain Score 12/07/18 1356 3     Pain Loc --      Pain Edu? --      Excl. in [place]? --    Constitutional: Alert and oriented. Well appearing and in no distress. Eyes: Conjunctivae are normal. Normal extraocular movements. ENT      Head: Normocephalic and atraumatic.      Nose: No congestion/rhinnorhea.      Mouth/Throat: Mucous membranes are moist.      Neck: No stridor. Cardiovascular: Normal rate, regular rhythm. No murmurs, rubs, or gallops. Respiratory: Normal respiratory effort without tachypnea nor retractions. Breath sounds are clear and equal bilaterally. No wheezes/rales/rhonchi. Gastrointestinal: Soft and nontender. Normal bowel sounds Musculoskeletal: Nontender with normal range of motion in extremities. No lower extremity tenderness nor edema. Neurologic:  Normal speech and language. No gross focal neurologic deficits are appreciated.  Skin:  Skin is warm, dry and intact. No rash noted. Psychiatric:  Mood and affect are normal. Speech and behavior are normal.  ____________________________________________  EKG: Interpreted by me.  Sinus rhythm with a rate of 81 bpm, incomplete right bundle branch block, nonspecific T wave abnormality, normal QT  ____________________________________________  ED COURSE:  As part of my medical decision making, I reviewed the following data within the [place] History obtained from family if available, nursing notes, old chart and ekg, as well as notes from prior ED visits. Patient presented for abdominal pain, we will assess with labs and imaging as indicated at this time.    Procedures ____________________________________________   LABS (pertinent positives/negatives)  Labs Reviewed  COMPREHENSIVE METABOLIC PANEL - Abnormal; Notable for the following components:      Result Value   Glucose, Bld 173 (*)    All other components within normal limits  URINALYSIS, COMPLETE (UACMP) WITH MICROSCOPIC - Abnormal; Notable for the following components:   Color, Urine YELLOW (*)    APPearance CLEAR (*)    All other components within normal limits  FIBRIN DERIVATIVES D-DIMER ([facility] ONLY) - Abnormal; Notable for the following components:   Fibrin derivatives D-dimer ([facility]) 1,056.56 (*)    All other components within normal limits  LIPASE, BLOOD  CBC  POC URINE PREG, ED    RADIOLOGY Images were viewed by me  Sinus rhythm with a rate of 81 bpm, incomplete right bundle branch block, nonspecific T wave abnormality, normal QT CTA chest  IMPRESSION: 1. Negative for acute pulmonary embolus. 2. No acute pulmonary infiltrate 3. New 6 mm lingular pulmonary nodule, likely infectious or inflammatory or possibly an intrapulmonary node given rapid development since 11/28/2018. ____________________________________________   DIFFERENTIAL DIAGNOSIS   Chronic pain, scar tissue, choledocholithiasis, GERD, peptic ulcer disease  FINAL ASSESSMENT AND PLAN  Flank pain   Plan: The patient had presented for right upper quadrant abdominal pain. Patient's labs were reassuring. Patient's imaging did not reveal any acute process.  I did instruct her about her pulmonary nodule that we found.  She is cleared for outpatient follow-up.   Laurence Aly, MD    Note: This note was generated in part or whole with voice recognition software. Voice recognition is usually quite accurate but there are transcription errors that can and very often do occur. I apologize for any typographical errors that were not detected and corrected.     Earleen Newport, MD 12/07/18 2012

## 2018-12-07 NOTE — ED Triage Notes (Signed)
Pt to ED via POV c/o RUQ abd pain that radiates into her back. Pt has hx/o cholecystectomy. Pt states that the pain gets worse when she takes a deep breath and when she sneezes. Pt denies fever, chills, N/V/D. Pt is in NAD at this time. Has had pain x 1 week.

## 2018-12-09 ENCOUNTER — Encounter: Payer: Self-pay | Admitting: Physical Therapy

## 2018-12-09 ENCOUNTER — Telehealth: Payer: Self-pay | Admitting: Nurse Practitioner

## 2018-12-09 ENCOUNTER — Telehealth: Payer: Self-pay | Admitting: Family Medicine

## 2018-12-09 NOTE — Telephone Encounter (Signed)
Copied from [place] [phone]. Topic: Quick Communication - See Telephone Encounter >> Dec 09, 2018 10:34 AM Nils Flack wrote: CRM for notification. See Telephone encounter for: 12/09/18. Rhonda from cover my meds called, she is calling to follow up on PA for Saxenda.  Please call back to verigfy whether or not pt needs this PA. Phone is [phone] reference # [id number]

## 2018-12-09 NOTE — Telephone Encounter (Signed)
Patient went to ED for increased pain, they gave her 20 tabs of Diazipam 5mg  for muscle spasms.  She called 12-08-18 to let us know

## 2018-12-16 NOTE — Telephone Encounter (Signed)
Sent to Juliann Pulse do you know anything of the status of this PA?   Thanks

## 2018-12-16 NOTE — Telephone Encounter (Signed)
I have completed the PA on cover my meds awaiting approval or refusal from insurance.

## 2018-12-17 ENCOUNTER — Telehealth: Payer: Self-pay

## 2018-12-17 NOTE — Telephone Encounter (Signed)
Copied from [place] [phone]. Topic: Quick Communication - See Telephone Encounter >> Dec 09, 2018 10:34 AM Nils Flack wrote: CRM for notification. See Telephone encounter for: 12/09/18. Rhonda from cover my meds called, she is calling to follow up on PA for Saxenda.  Please call back to verigfy whether or not pt needs this PA. Phone is [phone] reference # [id number] >> Dec 17, 2018 12:53 PM Carolyn Stare wrote:  Pt said her insurance company has approve her  Kirke Shaggy and is asking if the office is aware

## 2018-12-18 NOTE — Telephone Encounter (Signed)
Fax from [facility] pt was approved from 12/16/2018 to 04/20/2019

## 2018-12-18 NOTE — Telephone Encounter (Signed)
Pt was approved by insurance [employer] from 12/16/2018 - 04/20/2019

## 2018-12-18 NOTE — Telephone Encounter (Signed)
This is not a medication that I have discussed restarting with the patient.  She has been off of this for a year given that her weight stabilized while taking this.  If she would like to restart this medication she will need to come to the office for a visit to discuss.

## 2018-12-18 NOTE — Telephone Encounter (Signed)
Sent to PCP to send in [place] 18 MG/3 ML [place] SOPN   Send to [facility] listed in pt's chart.

## 2018-12-18 NOTE — Telephone Encounter (Signed)
Called and spoke with pt. Pt advised and voiced understanding.  

## 2018-12-19 NOTE — Telephone Encounter (Signed)
I discontinued the medication previously as she was not getting continued benefit from this. She has to have an office visit to consider this medication or other weight loss medication. She additionally would have to have scheduled follow-up to be able to continue on medication like this. If she is not able to come in for a visit with [place] I would suggest establishing with a [facility] practice so she can have appropriate follow-up. Thanks.

## 2018-12-19 NOTE — Telephone Encounter (Signed)
Called pt and left a VM to call back. CRM created and sent to [facility] pool. 

## 2018-12-19 NOTE — Telephone Encounter (Signed)
Called and spoke with patient. Pt stated that she would like to go back on this medication however, she can't afford to come in for an OV because of her new insurance. Pt's insurance will not except [place] only [employer].   Juliann Pulse stated that the Rx was refilled by Dr.Sonnenberg in the past but must have been removed due to being denied.   Pt would like to have a perscription sent in if possible without an OV since she has been approved.

## 2018-12-19 NOTE — Telephone Encounter (Signed)
I am happy to refill her Ativan for up to 90 days until she establishes with a new provider.  I do not know anybody in the [facility] system currently.  I believe the closest office might be in [place].

## 2018-12-19 NOTE — Telephone Encounter (Signed)
Patient returning [facility] phone call. Please advise. Relayed Dr Ellen Henri message. States understanding. Patient would still like a call back incase there was anything else that was needed to be discusses.

## 2018-12-19 NOTE — Telephone Encounter (Signed)
Called and spoke with pt. Pt advised and voiced understanding. Pt stated will you at least still refill her ativan until she can find another provider at [facility] and she wanted to know if you have anyone at [facility] practice to see for primary care?   Pt is pretty upset and sad that she can continue to have you as her provider she stated that she really likes you and trust you opinion. Marland Kitchen

## 2018-12-20 NOTE — Telephone Encounter (Signed)
Pt called back. °

## 2018-12-20 NOTE — Telephone Encounter (Signed)
Pt advised and voiced understanding. Pt wanted to know how much it would cost her for an OV with Dr. Caryl Bis if she pays out of pocket?   Could you give me a prise range Nicole Walter?   Thanks

## 2018-12-20 NOTE — Telephone Encounter (Signed)
Pt has been scheduled for an appt for March 9 at 3:15 PM ( will need kathy to schedule this) Sent to PCP to approve first. Pt would like to know what kind of charge would be for this try of appt?

## 2018-12-20 NOTE — Telephone Encounter (Signed)
It would depend on how Dr. Caryl Bis codes the office visit it can range from 105.00 -350.00 with 55% off if she does not have insurance. This is just an estimate.

## 2018-12-20 NOTE — Telephone Encounter (Signed)
Called pt and phone kept reading could not leave a VM to call back.

## 2018-12-20 NOTE — Telephone Encounter (Signed)
Called and pt was not home spoke with pt's husband. He stated that she was out and will be back later will try and call again later today.

## 2018-12-20 NOTE — Telephone Encounter (Signed)
Patient returning call.

## 2018-12-20 NOTE — Telephone Encounter (Signed)
That is fine.  Please see Nicole Walter's message below regarding potential cost.  I will not know how I am going to code it until we see the patient.

## 2018-12-22 ENCOUNTER — Other Ambulatory Visit: Payer: Self-pay | Admitting: Family Medicine

## 2018-12-23 NOTE — Telephone Encounter (Signed)
Last OV 10/11/2018   Last refilled 10/24/2018 disp 60 with 1 refill   Pt will end up having to switch to [facility] due to insurance   Next OV 02/21/2019

## 2018-12-24 ENCOUNTER — Encounter: Payer: Self-pay | Admitting: Nurse Practitioner

## 2018-12-31 ENCOUNTER — Telehealth: Payer: Self-pay | Admitting: *Deleted

## 2018-12-31 NOTE — Telephone Encounter (Signed)
Copied from [place] [phone]. Topic: General - Inquiry >> Dec 31, 2018 12:08 PM Vernona Rieger wrote: Reason for CRM: Patient states she now has [employer] & is aware that she is no longer in network. She asked if Dr Caryl Bis would fill out a continuation of care form for her so she can continue to come to him.  She said she has an appointment on march 9th and needs to know before then.

## 2019-01-03 NOTE — Telephone Encounter (Signed)
Pt understood.  Gae Bon, CMA

## 2019-01-03 NOTE — Telephone Encounter (Signed)
Reason for CRM: Patient states she now has [employer] & is aware that she is no longer in network. She asked if Dr Caryl Bis would fill out a continuation of care form for her so she can continue to come to him.  She said she has an appointment on march 9th and needs to know before then.

## 2019-01-03 NOTE — Telephone Encounter (Signed)
I am happy to review the form and fill it out, though I will be out of the office next week so likely will not be able to get it completed prior to the 9th.

## 2019-01-12 NOTE — Progress Notes (Signed)
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 CT wo contrast: No results found for this or any previous visit. Thoracic CT w/wo contrast: No results found for this or any previous visit. Thoracic CT w/wo contrast: No results found for this or any previous visit.  Thoracic CT w contrast: No results found for this or any previous visit. Thoracic DG 2-3 views:  Results for orders placed during the hospital encounter of 03/29/18  DG Thoracic Spine 2 View   Narrative CLINICAL DATA:  Chronic thoracic back pain.  EXAM: THORACIC SPINE 2 VIEWS  COMPARISON:  CT chest dated November 28, 2017.  FINDINGS: Twelve rib-bearing thoracic vertebral bodies. Slight levocurvature of the upper thoracic spine. No acute fracture or subluxation. Vertebral body heights are preserved. Alignment is normal. Scattered mild anterior endplate spurring throughout the thoracic spine. Intervertebral disc  spaces are maintained.  IMPRESSION: 1.  No acute osseous abnormality.  Mild degenerative changes.   Electronically Signed   By: Titus Dubin M.D.   On: 03/29/2018 17:50    Thoracic DG 4 views: No results found for this or any previous visit. Thoracic DG: No results found for this or any previous visit. Thoracic DG w/swimmers view: No results found for this or any previous visit. Thoracic DG Myelogram views: No results found for this or any previous visit. Thoracic DG Myelogram views: No results found for this or any previous visit.  Lumbosacral Imaging: Lumbar MR wo contrast:  Results for orders placed during the hospital encounter of 03/02/16  MR Lumbar Spine Wo Contrast   Narrative CLINICAL DATA:  Twisting injury 7 months ago with persistent low back and bilateral leg pain, worse on the left. No previous relevant surgery.  EXAM: MRI LUMBAR SPINE WITHOUT CONTRAST  TECHNIQUE: Multiplanar, multisequence MR imaging of the lumbar spine was performed. No intravenous contrast was administered.  COMPARISON:  Abdominal pelvic CT 12/20/2015  FINDINGS: Segmentation: Conventional anatomy assumed, with the last open disc space designated L5-S1.  Alignment:  Normal.  Vertebrae: No worrisome osseous lesion, acute fracture or pars defect. The lumbar pedicles are somewhat short on a congenital basis. The visualized sacroiliac joints appear unremarkable.  Conus medullaris: Extends to the L1 level and appears normal.  Paraspinal and other soft tissues: No significant paraspinal findings.  Disc levels:  There are no significant disc space findings from T11-12 through L1-2.  L2-3: Stable loss of disc height with mild annular disc bulging. No spinal stenosis or nerve root encroachment.  L3-4: Stable loss of disc height with annular disc bulging and a shallow left paracentral disc protrusion. There is mild facet and ligamentous hypertrophy. These factors contribute to  asymmetric narrowing of the left lateral recess and potential left L4 nerve root encroachment. Both foramina are mildly narrowed without definite L3 nerve root encroachment.  L4-5: Disc height is relatively maintained. There is mild disc bulging with moderate facet and ligamentous hypertrophy. These factors contribute to mild narrowing of the lateral recesses and foramina bilaterally. There is no nerve root encroachment.  L5-S1: Disc height and hydration are maintained. There is mild disc bulging and facet hypertrophy. No spinal stenosis or nerve root encroachment.  IMPRESSION: 1. No acute findings, high-grade spinal stenosis or definite nerve root encroachment. 2. At L3-4, there is disc bulging and a shallow left paracentral disc protrusion, contributing to asymmetric narrowing of the left lateral recess and potential left L4 nerve root encroachment. 3. Moderate facet and ligamentous hypertrophy at L4-5.   Electronically Signed   By: Richardean Sale M.D.   On: 03/02/2016 09:10    Lumbar MR wo contrast: No procedure found. Lumbar MR w/wo contrast: No results found for this or any previous visit. Lumbar MR w/wo contrast: No results found for this or any previous visit. Lumbar MR w contrast:  No results found for this or any previous visit. Lumbar CT wo contrast: No results found for this or any previous visit. Lumbar CT w/wo contrast: No results found for this or any previous visit. Lumbar CT w/wo contrast: No results found for this or any previous visit. Lumbar CT w contrast: No results found for this or any previous visit. Lumbar DG 1V: No results found for this or any previous visit. Lumbar DG 1V (Clearing): No results found for this or any previous visit. Lumbar DG 2-3V (Clearing): No results found for this or any previous visit. Lumbar DG 2-3 views: No results found for this or any previous visit. Lumbar DG (Complete) 4+V: No results found for this or any previous visit.        Lumbar DG F/E views: No results found for this or any previous visit.       Lumbar DG Bending views: No results found for this or any previous visit.       Lumbar DG Myelogram views: No results found for this or any previous visit. Lumbar DG Myelogram: No results found for this or any previous visit. Lumbar DG Myelogram: No results found for this or any previous visit. Lumbar DG Myelogram: No results found for this or any previous visit. Lumbar DG Myelogram Lumbosacral: No results found for this or any previous visit. Lumbar DG Diskogram views: No results found for this or any previous visit. Lumbar DG Diskogram views: No results found for this or any previous visit. Lumbar DG Epidurogram OP: No results found for this or any previous visit. Lumbar DG Epidurogram IP: No results found for this or any previous visit.  Sacroiliac Joint Imaging: Sacroiliac Joint DG: No results found for this or any previous visit. Sacroiliac Joint MR w/wo contrast: No results found for this or any previous visit. Sacroiliac Joint MR wo contrast: No results found for this or any previous visit.  Spine Imaging: Whole Spine DG Myelogram views: No results found for this or any previous visit. Whole Spine MR Mets screen: No results found for this or any previous visit. Whole Spine MR Mets screen: No results found for this or any previous visit. Whole Spine MR w/wo: No results found for this or any previous visit. MRA Spinal Canal w/ cm: No results found for this or any previous visit. MRA Spinal Canal wo/ cm: No procedure found. MRA Spinal Canal w/wo cm: No results found for this or any previous visit. Spine Outside MR Films: No results found for this or any previous visit. Spine Outside CT Films: No results found for this or any previous visit. CT-Guided Biopsy: No results found for this or any previous visit. CT-Guided Needle Placement: No results found for this or any previous visit. DG Spine outside: No  results found for this or any previous visit. IR Spine outside: No results found for this or any previous visit. NM Spine outside: No results found for this or any previous visit.  Hip Imaging: Hip-R MR w contrast: No results found for this or any previous visit. Hip-L MR w contrast: No results found for this or any previous visit. Hip-R MR w/wo contrast: No results found for this or any previous visit. Hip-L MR w/wo contrast: No results found for this or any previous visit. Hip-R MR wo contrast:  Results for orders placed during the hospital encounter of 02/07/17  MR HIP RIGHT WO CONTRAST   Narrative CLINICAL DATA:  Right hip, buttock and groin pain for 2 years. No  known injury.  EXAM: MR OF THE RIGHT HIP WITHOUT CONTRAST  TECHNIQUE: Multiplanar, multisequence MR imaging was performed. No intravenous contrast was administered.  COMPARISON:  CT abdomen and pelvis and 12/20/2015.  FINDINGS: Bones: Small subchondral cyst in the right acetabular roof is noted. Bone marrow signal is otherwise normal. No fracture or stress change. No avascular necrosis of the femoral heads.  Articular cartilage and labrum  Articular cartilage:  Minimally degenerated.  Labrum:  Intact.  Joint or bursal effusion  Joint effusion:  No effusion.  Bursae:  Negative.  Muscles and tendons  Muscles and tendons:  Intact.  Other findings  Miscellaneous: Sigmoid diverticulosis without diverticulitis is seen.  IMPRESSION: Mild right hip degenerative disease.  No acute finding.  Sigmoid diverticulosis.   Electronically Signed   By: Inge Rise M.D.   On: 02/07/2017 15:50    Hip-L MR wo contrast: No results found for this or any previous visit. Hip-R CT w contrast: No results found for this or any previous visit. Hip-L CT w contrast: No results found for this or any previous visit. Hip-R CT w/wo contrast: No results found for this or any previous visit. Hip-L CT w/wo contrast: No  results found for this or any previous visit. Hip-R CT wo contrast: No results found for this or any previous visit. Hip-L CT wo contrast: No results found for this or any previous visit. Hip-R DG 2-3 views:  Results for orders placed during the hospital encounter of 08/31/16  DG HIP UNILAT W OR W/O PELVIS 2-3 VIEWS RIGHT   Narrative CLINICAL DATA:  58 year old female with chronic hip pain. Initial encounter.  EXAM: DG HIP (WITH OR WITHOUT PELVIS) 2-3V RIGHT  COMPARISON:  Left hip films same date are dictated separately.  FINDINGS: Mild right hip joint degenerative changes with subchondral cystic changes superior lateral right acetabulum.  No plain film evidence of avascular necrosis.  No fracture.  IMPRESSION: Mild right hip joint degenerative changes.   Electronically Signed   By: Genia Del M.D.   On: 08/31/2016 12:55    Hip-L DG 2-3 views:  Results for orders placed during the hospital encounter of 08/31/16  DG HIP UNILAT W OR W/O PELVIS 2-3 VIEWS LEFT   Narrative CLINICAL DATA:  59 year old female with chronic hip pain. Initial encounter.  EXAM: DG HIP (WITH OR WITHOUT PELVIS) 2-3V LEFT  COMPARISON:  Right hip films and pelvis same date dictated separately.  FINDINGS: Mild left hip joint degenerative changes with subchondral cysts superior acetabulum. Osteophyte.  No plain film evidence of avascular necrosis.  No fracture.  IMPRESSION: Mild left hip joint degenerative changes.   Electronically Signed   By: Genia Del M.D.   On: 08/31/2016 12:56    Hip-R DG Arthrogram: No results found for this or any previous visit. Hip-L DG Arthrogram: No results found for this or any previous visit. Hip-B DG Bilateral: No results found for this or any previous visit.  Knee Imaging: Knee-R MR w contrast: No results found for this or any previous visit. Knee-L MR w contrast: No results found for this or any previous visit. Knee-R MR w/wo contrast: No  results found for this or any previous visit. Knee-L MR w/wo contrast: No results found for this or any previous visit. Knee-R MR wo contrast: No results found for this or any previous visit. Knee-L MR wo contrast: No results found for this or any previous visit. Knee-R CT w contrast: No results found for this or any previous visit.  Knee-L CT w contrast: No results found for this or any previous visit. Knee-R CT w/wo contrast: No results found for this or any previous visit. Knee-L CT w/wo contrast: No results found for this or any previous visit. Knee-R CT wo contrast: No results found for this or any previous visit. Knee-L CT wo contrast: No results found for this or any previous visit. Knee-R DG 1-2 views:  Results for orders placed during the hospital encounter of 02/11/18  DG Knee 1-2 Views Right   Narrative CLINICAL DATA:  Chronic lateral RIGHT knee pain  EXAM: RIGHT KNEE - 1-2 VIEW  COMPARISON:  None  FINDINGS: Osseous mineralization normal.  Joint spaces preserved.  No fracture, dislocation, or bone destruction.  Prominent spur at cranial margin of patella at quadriceps tendon insertion.  No knee joint effusion.  IMPRESSION: Patellar spur at quadriceps tendon insertion.  No acute osseous abnormalities.   Electronically Signed   By: Lavonia Dana M.D.   On: 02/11/2018 12:51    Knee-L DG 1-2 views: No results found for this or any previous visit. Knee-R DG 3 views: No results found for this or any previous visit. Knee-L DG 3 views: No results found for this or any previous visit. Knee-R DG 4 views: No results found for this or any previous visit. Knee-L DG 4 views: No results found for this or any previous visit. Knee-R DG Arthrogram: No results found for this or any previous visit. Knee-L DG Arthrogram: No results found for this or any previous visit.  Ankle Imaging: Ankle-R DG Complete: No results found for this or any previous visit. Ankle-L DG Complete: No  results found for this or any previous visit.  Foot Imaging: Foot-R DG Complete: No results found for this or any previous visit. Foot-L DG Complete: No results found for this or any previous visit.  Elbow Imaging: Elbow-R DG Complete: No results found for this or any previous visit. Elbow-L DG Complete: No results found for this or any previous visit.  Wrist Imaging: Wrist-R DG Complete: No results found for this or any previous visit. Wrist-L DG Complete: No results found for this or any previous visit.  Hand Imaging: Hand-R DG Complete: No results found for this or any previous visit. Hand-L DG Complete: No results found for this or any previous visit.  Complexity Note: Imaging results reviewed. Results shared with Ms. Annunziato, using Layman's terms.                         Meds   Current Outpatient Medications:  .  cholecalciferol (VITAMIN D3) 25 MCG (1000 UT) tablet, Take 1,000 Units by mouth daily., Disp: , Rfl:  .  dicyclomine (BENTYL) 20 MG tablet, Take 20 mg by mouth 3 (three) times daily. , Disp: , Rfl: 2 .  fluocinonide cream (LIDEX) 5.91 %, Apply 1 application topically 2 (two) times daily., Disp: 30 g, Rfl: 2 .  fluticasone (FLONASE) 50 MCG/ACT nasal spray, instill 2 sprays into each nostril once daily (Patient taking differently: Place 2 sprays into both nostrils daily. ), Disp: 16 g, Rfl: 11 .  LINZESS 72 MCG capsule, Take 72 mcg by mouth daily before breakfast. , Disp: , Rfl: 3 .  LORazepam (ATIVAN) 1 MG tablet, Take 1 tablet (1 mg total) by mouth 2 (two) times daily as needed for anxiety., Disp: 60 tablet, Rfl: 0 .  Multiple Vitamin (MULTI-VITAMINS) TABS, Take 1 tablet by mouth daily. , Disp: , Rfl:  .  naloxone (NARCAN) nasal spray 4 mg/0.1 mL, Spray into one nostril. Repeat with second device into other nostril after 2-3 minutes if no or minimal response. Use in case of opioid overdose., Disp: 1 kit, Rfl: 0 .  [START ON 02/02/2019] oxyCODONE (OXY IR/ROXICODONE) 5 MG  immediate release tablet, Take 1 tablet (5 mg total) by mouth every 6 (six) hours as needed for severe pain., Disp: 120 tablet, Rfl: 0 .  oxyCODONE (OXY IR/ROXICODONE) 5 MG immediate release tablet, Take 1 tablet (5 mg total) by mouth every 6 (six) hours as needed for severe pain., Disp: 120 tablet, Rfl: 0 .  RABEprazole (ACIPHEX) 20 MG tablet, Take 20 mg by mouth 2 (two) times daily. , Disp: , Rfl:  .  sucralfate (CARAFATE) 1 g tablet, Take 1 g by mouth 3 (three) times daily. , Disp: , Rfl: 0 .  TRINTELLIX 10 MG TABS tablet, TAKE 1 TABLET BY MOUTH DAILY (Patient taking differently: Take 10 mg by mouth daily. ), Disp: 90 tablet, Rfl: 1 .  baclofen (LIORESAL) 10 MG tablet, Take 1 tablet (10 mg total) by mouth 4 (four) times daily for 30 days., Disp: 120 tablet, Rfl: 0 .  oxyCODONE (OXY IR/ROXICODONE) 5 MG immediate release tablet, Take 1 tablet (5 mg total) by mouth every 6 (six) hours as needed for severe pain., Disp: 120 tablet, Rfl: 0 .  predniSONE (DELTASONE) 20 MG tablet, Take 3 tab(s) in the morning x 3 days, then 2 tab(s) x 3 days, followed by 1 tab x 3 days., Disp: 21 tablet, Rfl: 0  ROS  Constitutional: Denies any fever or chills Gastrointestinal: No reported hemesis, hematochezia, vomiting, or acute GI distress Musculoskeletal: Denies any acute onset joint swelling, redness, loss of ROM, or weakness Neurological: No reported episodes of acute onset apraxia, aphasia, dysarthria, agnosia, amnesia, paralysis, loss of coordination, or loss of consciousness  Allergies  Ms. Mungia is allergic to other; food; and pea.  PFSH  Drug: Ms. Brenes  reports no history of drug use. Alcohol:  reports no history of alcohol use. Tobacco:  reports that she has been smoking e-cigarettes. She started smoking about 44 years ago. She has a 35.00 pack-year smoking history. She has never used smokeless tobacco. Medical:  has a past medical history of Abnormal liver enzymes (02/08/2016), Allergy, Anxiety, Chest  pain (07/05/2016), Chronic pain, Depression, DJD (degenerative joint disease), Fibromyalgia, High cholesterol, Rash of back (01/28/2016), and Weight loss due to medication. Surgical: Ms. Pollman  has a past surgical history that includes Cholecystectomy; Tubal ligation; Ankle surgery; Nasal sinus surgery; Tonsillectomy; Colonoscopy; Esophagogastroduodenoscopy (egd) with propofol (N/A, 02/22/2016); Colonoscopy with propofol (N/A, 02/22/2016); and Breast biopsy (Left, 1997). Family: family history includes Arthritis in her father, paternal grandfather, and paternal grandmother; Cancer in her brother and father; Dementia in her sister; Diabetes in her sister; Drug abuse in her brother, brother, and sister; Heart disease in her mother; Post-traumatic stress disorder in her brother; Stroke in her mother.  Constitutional Exam  General appearance: Well nourished, well developed, and well hydrated. In no apparent acute distress Vitals:   01/13/19 1123  BP: 124/88  Pulse: 62  Resp: 18  Temp: 97.8 F (36.6 C)  TempSrc: Oral  SpO2: 100%  Weight: 166 lb (75.3 kg)  Height: '5\' 4"'  (1.626 m)   BMI Assessment: Estimated body mass index is 28.49 kg/m as calculated from the following:   Height as of this encounter: '5\' 4"'  (1.626 m).   Weight as of this encounter: 166 lb (75.3  kg).  BMI interpretation table: BMI level Category Range association with higher incidence of chronic pain  <18 kg/m2 Underweight   18.5-24.9 kg/m2 Ideal body weight   25-29.9 kg/m2 Overweight Increased incidence by 20%  30-34.9 kg/m2 Obese (Class I) Increased incidence by 68%  35-39.9 kg/m2 Severe obesity (Class II) Increased incidence by 136%  >40 kg/m2 Extreme obesity (Class III) Increased incidence by 254%   Patient's current BMI Ideal Body weight  Body mass index is 28.49 kg/m. Ideal body weight: 54.7 kg (120 lb 9.5 oz) Adjusted ideal body weight: 62.9 kg (138 lb 12.1 oz)   BMI Readings from Last 4 Encounters:  01/13/19 28.49  kg/m  12/07/18 28.67 kg/m  11/28/18 28.67 kg/m  11/12/18 27.98 kg/m   Wt Readings from Last 4 Encounters:  01/13/19 166 lb (75.3 kg)  12/07/18 167 lb (75.8 kg)  11/28/18 167 lb (75.8 kg)  11/12/18 163 lb (73.9 kg)  Psych/Mental status: Alert, oriented x 3 (person, place, & time)       Eyes: PERLA Respiratory: No evidence of acute respiratory distress  Cervical Spine Area Exam  Skin & Axial Inspection: No masses, redness, edema, swelling, or associated skin lesions Alignment: Symmetrical Functional ROM: Unrestricted ROM      Stability: No instability detected Muscle Tone/Strength: Functionally intact. No obvious neuro-muscular anomalies detected. Sensory (Neurological): Unimpaired Palpation: No palpable anomalies              Upper Extremity (UE) Exam    Side: Right upper extremity  Side: Left upper extremity  Skin & Extremity Inspection: Skin color, temperature, and hair growth are WNL. No peripheral edema or cyanosis. No masses, redness, swelling, asymmetry, or associated skin lesions. No contractures.  Skin & Extremity Inspection: Skin color, temperature, and hair growth are WNL. No peripheral edema or cyanosis. No masses, redness, swelling, asymmetry, or associated skin lesions. No contractures.  Functional ROM: Unrestricted ROM          Functional ROM: Unrestricted ROM          Muscle Tone/Strength: Functionally intact. No obvious neuro-muscular anomalies detected.  Muscle Tone/Strength: Functionally intact. No obvious neuro-muscular anomalies detected.  Sensory (Neurological): Unimpaired          Sensory (Neurological): Unimpaired          Palpation: No palpable anomalies              Palpation: No palpable anomalies              Provocative Test(s):  Phalen's test: deferred Tinel's test: deferred Apley's scratch test (touch opposite shoulder):  Action 1 (Across chest): deferred Action 2 (Overhead): deferred Action 3 (LB reach): deferred   Provocative Test(s):  Phalen's  test: deferred Tinel's test: deferred Apley's scratch test (touch opposite shoulder):  Action 1 (Across chest): deferred Action 2 (Overhead): deferred Action 3 (LB reach): deferred    Thoracic Spine Area Exam  Skin & Axial Inspection: No masses, redness, or swelling Alignment: Symmetrical Functional ROM: Unrestricted ROM Stability: No instability detected Muscle Tone/Strength: Functionally intact. No obvious neuro-muscular anomalies detected. Sensory (Neurological): Unimpaired Muscle strength & Tone: No palpable anomalies  Lumbar Spine Area Exam  Skin & Axial Inspection: No masses, redness, or swelling Alignment: Symmetrical Functional ROM: Unrestricted ROM       Stability: No instability detected Muscle Tone/Strength: Functionally intact. No obvious neuro-muscular anomalies detected. Sensory (Neurological): Unimpaired Palpation: No palpable anomalies       Provocative Tests: Hyperextension/rotation test: deferred today  Lumbar quadrant test (Kemp's test): deferred today       Lateral bending test: deferred today       Patrick's Maneuver: deferred today                   FABER* test: deferred today                   S-I anterior distraction/compression test: deferred today         S-I lateral compression test: deferred today         S-I Thigh-thrust test: deferred today         S-I Gaenslen's test: deferred today         *(Flexion, ABduction and External Rotation)  Gait & Posture Assessment  Ambulation: Unassisted Gait: Relatively normal for age and body habitus Posture: WNL   Lower Extremity Exam    Side: Right lower extremity  Side: Left lower extremity  Stability: No instability observed          Stability: No instability observed          Skin & Extremity Inspection: Skin color, temperature, and hair growth are WNL. No peripheral edema or cyanosis. No masses, redness, swelling, asymmetry, or associated skin lesions. No contractures.  Skin & Extremity Inspection:  Skin color, temperature, and hair growth are WNL. No peripheral edema or cyanosis. No masses, redness, swelling, asymmetry, or associated skin lesions. No contractures.  Functional ROM: Unrestricted ROM                  Functional ROM: Unrestricted ROM                  Muscle Tone/Strength: Functionally intact. No obvious neuro-muscular anomalies detected.  Muscle Tone/Strength: Functionally intact. No obvious neuro-muscular anomalies detected.  Sensory (Neurological): Unimpaired        Sensory (Neurological): Unimpaired        DTR: Patellar: deferred today Achilles: deferred today Plantar: deferred today  DTR: Patellar: deferred today Achilles: deferred today Plantar: deferred today  Palpation: No palpable anomalies  Palpation: No palpable anomalies   Assessment   Status Diagnosis  Controlled Controlled Controlled 1. Cervicalgia (Bilateral) (L>R)   2. Cervical facet syndrome (Bilateral) (L>R)   3. Cervical facet hypertrophy (Bilateral)   4. Spondylosis without myelopathy or radiculopathy, cervical region   5. Chronic hip pain (Bilateral) (R>L)   6. Osteoarthritis of hip (Bilateral)   7. Chronic low back pain (Bilateral) (L>R)   8. Muscle spasm   9. Myofascial pain syndrome, cervical (trapezius) (Left)      Updated Problems: Problem  Cervicalgia (Bilateral) (L>R)    Plan of Care  Pharmacotherapy (Medications Ordered): Meds ordered this encounter  Medications  . predniSONE (DELTASONE) 20 MG tablet    Sig: Take 3 tab(s) in the morning x 3 days, then 2 tab(s) x 3 days, followed by 1 tab x 3 days.    Dispense:  21 tablet    Refill:  0    Do not add to the "Automatic Refill" notification system.  . baclofen (LIORESAL) 10 MG tablet    Sig: Take 1 tablet (10 mg total) by mouth 4 (four) times daily for 30 days.    Dispense:  120 tablet    Refill:  0    Do not place this medication, or any other prescription from our practice, on "Automatic Refill". Patient may have  prescription filled one day early if pharmacy is closed on scheduled refill date.  Medications administered today: Leisa Gault had no medications administered during this visit.  Orders:  No orders of the defined types were placed in this encounter.  Lab Orders  No laboratory test(s) ordered today   Imaging Orders  No imaging studies ordered today   Referral Orders  No referral(s) requested today   Planned follow-up:   Return for PPE (2 wks) w/ Dr. Dossie Arbour. Schedule her for therapeutic right cervical facet RFA #1 under fluoro and IV sedation left rhomboid TPI PRN   Interventional management options: Considering:   Diagnostic bilateral cervical facet block #4  Possible right cervical facet RFA #1  Diagnostic left-sided CESI #3  Diagnostic left intra-articular shoulder joint injection #2  Diagnostic right intra-articular shoulder joint injection #1  Diagnostic right suprascapular nerve block #2  Diagnostic left suprascapular nerve block #1  Possible bilateral suprascapular nerve RFA #1  Diagnostic bilateral lumbar facet block  Possible bilateral lumbar facet RFA  Diagnostic right intra-articular hip joint injection #3 Diagnostic left intra-articular hip joint injection #2  Diagnostic bilateral femoral and obturator tickler branch blocks Possible bilateral hip joint RFA  Diagnosticright intra-articular knee joint injection #2 Therapeutic right-sided, [place] knee joint injections series #2.  (Last completed on 05/16/2017) Possible right sided genicular nerve block  Possible right sided genicular nerve RFA  Possible left-sided lumbar epidural steroid injection Therapeutic right-sided thoracic ESI #3    Palliative PRN treatment(s):   Palliativebilateral suprascapular nerve block  Palliative left cervical facet RFA #2 (last done on 03/28/2018)   Future Appointments  Date Time Provider [place]  02/18/2019 11:00 AM Vevelyn Francois, NP [facility] None   02/21/2019 10:00 AM Nicole Haven, MD [facility]  10/24/2019 10:00 AM Harlin Heys, MD [facility] None   Primary Care Physician: Nicole Haven, MD Location: [facility] Facility Note by: Nicole Cola, MD Date: 01/13/2019; Time: 3:10 PM

## 2019-01-13 ENCOUNTER — Other Ambulatory Visit: Payer: Self-pay

## 2019-01-13 ENCOUNTER — Encounter: Payer: Self-pay | Admitting: Pain Medicine

## 2019-01-13 ENCOUNTER — Ambulatory Visit: Payer: BLUE CROSS/BLUE SHIELD | Attending: Pain Medicine | Admitting: Pain Medicine

## 2019-01-13 VITALS — BP 124/88 | HR 62 | Temp 97.8°F | Resp 18 | Ht 64.0 in | Wt 166.0 lb

## 2019-01-13 DIAGNOSIS — G8929 Other chronic pain: Secondary | ICD-10-CM | POA: Insufficient documentation

## 2019-01-13 DIAGNOSIS — M5442 Lumbago with sciatica, left side: Secondary | ICD-10-CM | POA: Diagnosis present

## 2019-01-13 DIAGNOSIS — M5441 Lumbago with sciatica, right side: Secondary | ICD-10-CM | POA: Insufficient documentation

## 2019-01-13 DIAGNOSIS — M47812 Spondylosis without myelopathy or radiculopathy, cervical region: Secondary | ICD-10-CM | POA: Diagnosis present

## 2019-01-13 DIAGNOSIS — M62838 Other muscle spasm: Secondary | ICD-10-CM

## 2019-01-13 DIAGNOSIS — M160 Bilateral primary osteoarthritis of hip: Secondary | ICD-10-CM | POA: Diagnosis present

## 2019-01-13 DIAGNOSIS — M25559 Pain in unspecified hip: Secondary | ICD-10-CM

## 2019-01-13 DIAGNOSIS — M542 Cervicalgia: Secondary | ICD-10-CM

## 2019-01-13 DIAGNOSIS — M7918 Myalgia, other site: Secondary | ICD-10-CM

## 2019-01-13 MED ORDER — PREDNISONE 20 MG PO TABS: ORAL_TABLET | ORAL | 0 refills | Status: DC

## 2019-01-13 MED ORDER — BACLOFEN 10 MG PO TABS: 10.0000 mg | ORAL_TABLET | Freq: Four times a day (QID) | ORAL | 0 refills | Status: DC

## 2019-01-13 NOTE — Progress Notes (Signed)
Safety precautions to be maintained throughout the outpatient stay will include: orient to surroundings, keep bed in low position, maintain call bell within reach at all times, provide assistance with transfer out of bed and ambulation.  

## 2019-01-13 NOTE — Patient Instructions (Addendum)
____________________________________________________________________________________________  Drug Holidays (Slow)  What is a "Drug Holiday"? Drug Holiday: is the name given to the period of time during which a patient stops taking a medication(s) for the purpose of eliminating tolerance to the drug.  Benefits . Improved effectiveness of opioids. . Decreased opioid dose needed to achieve benefits. . Improved pain with lesser dose.  What is tolerance? Tolerance: is the progressive decreased in effectiveness of a drug due to its repetitive use. With repetitive use, the body gets use to the medication and as a consequence, it loses its effectiveness. This is a common problem seen with opioid pain medications. As a result, a larger dose of the drug is needed to achieve the same effect that used to be obtained with a smaller dose.  How long should a "Drug Holiday" last? At least 14 consecutive days. (2 weeks)  What are withdrawals? Withdrawals: refers to the wide range of symptoms that occur after stopping or dramatically reducing opiate drugs after heavy and prolonged use. Withdrawal symptoms do not occur to patients that use low dose opioids, or those who take the medication sporadically. Contrary to benzodiazepine (example: Valium, Xanax, etc.) or alcohol withdrawals ("Delirium Tremens"), opioid withdrawals are not lethal. Withdrawals are the physical manifestation of the body getting rid of the excess receptors.  Expected Symptoms Early symptoms of withdrawal may include: . Agitation . Anxiety . Muscle aches . Increased tearing . Insomnia . Runny nose . Sweating . Yawning  Late symptoms of withdrawal may include: . Abdominal cramping . Diarrhea . Dilated pupils . Goose bumps . Nausea . Vomiting  Will I experience withdrawals? Due to the slow nature of the taper, it is very unlikely that you will experience any.  What is a slow taper? Taper: refers to the gradual decrease in  dose. ___________________________________________________________________________________________ ____________________________________________________________________________________________  Muscle Spasms & Cramps  Cause:  The most common cause of muscle spasms and cramps is vitamin and/or electrolyte (calcium, potassium, sodium, etc.) deficiencies.  Possible triggers: Sweating - causes loss of electrolytes thru the skin. Steroids - causes loss of electrolytes thru the urine.  Treatment: 1. Gatorade (or any other electrolyte-replenishing drink) - Take 1, 8 oz glass with each meal (3 times a day). 2. OTC (over-the-counter) Magnesium 400 to 500 mg - Take 1 tablet twice a day (one with breakfast and one before bedtime). If you have kidney problems, talk to your primary care physician before taking any Magnesium. 3. Tonic Water with quinine - Take 1, 8 oz glass before bedtime.   ____________________________________________________________________________________________

## 2019-01-20 NOTE — Telephone Encounter (Signed)
Pt called in to follow up on continue of care form which is online. Pt would like to be advise the status of this form so that she will know what to do next.    Advised that provider is out of the office today but we will follow up with her upon his response.

## 2019-01-20 NOTE — Telephone Encounter (Signed)
Did she drop off a form? I did not see a form in my folder prior to leaving the office last week.

## 2019-01-20 NOTE — Telephone Encounter (Signed)
Patient is requesting to know the status of the continuation of care form that was to be filed out, so patient could remain a patient here.

## 2019-01-21 NOTE — Telephone Encounter (Signed)
Pt stated that you get the form online I advised pt to print this off and bring it to our office pt advised and understood. Pt never brought a form up here for [place] to fill out as of yet.   Sent to PCP as an [place]

## 2019-01-22 ENCOUNTER — Other Ambulatory Visit: Payer: Self-pay | Admitting: Family Medicine

## 2019-01-22 ENCOUNTER — Telehealth: Payer: Self-pay | Admitting: Obstetrics and Gynecology

## 2019-01-22 NOTE — Telephone Encounter (Signed)
Noted. Please place in my folder once the patient brings this in.

## 2019-01-22 NOTE — Telephone Encounter (Signed)
The patient states she is on a fixed income because her husband is on disability and she does not work, and she states the insurance is stating they are not going to pay for her pap for last year, 2019, and she is doing an appeal, but she is not sure what else to do to get them to cover her expenses.  She was tearful, but appreciative that we are trying to help her, and she is asking to speak to the office manager, Crystal, to discuss more options, please advise, thanks.

## 2019-01-23 NOTE — Telephone Encounter (Signed)
Pt has not brought this to our office.

## 2019-01-23 NOTE — Telephone Encounter (Signed)
lmtrc

## 2019-01-27 NOTE — Telephone Encounter (Signed)
Pt aware per dje pap was done per pts request. Pt advised to contact billing to set up payment plan.

## 2019-02-17 ENCOUNTER — Encounter: Payer: Self-pay | Admitting: Nurse Practitioner

## 2019-02-18 ENCOUNTER — Other Ambulatory Visit: Payer: Self-pay

## 2019-02-18 ENCOUNTER — Ambulatory Visit: Payer: BLUE CROSS/BLUE SHIELD | Attending: Nurse Practitioner | Admitting: Nurse Practitioner

## 2019-02-18 DIAGNOSIS — M47812 Spondylosis without myelopathy or radiculopathy, cervical region: Secondary | ICD-10-CM | POA: Diagnosis not present

## 2019-02-18 DIAGNOSIS — M542 Cervicalgia: Secondary | ICD-10-CM

## 2019-02-18 DIAGNOSIS — G894 Chronic pain syndrome: Secondary | ICD-10-CM

## 2019-02-18 DIAGNOSIS — M47816 Spondylosis without myelopathy or radiculopathy, lumbar region: Secondary | ICD-10-CM

## 2019-02-18 DIAGNOSIS — M4724 Other spondylosis with radiculopathy, thoracic region: Secondary | ICD-10-CM | POA: Diagnosis not present

## 2019-02-18 MED ORDER — OXYCODONE HCL 5 MG PO TABS: 5.0000 mg | ORAL_TABLET | Freq: Four times a day (QID) | ORAL | 0 refills | Status: DC | PRN

## 2019-02-18 NOTE — Progress Notes (Signed)
Pain Management Encounter Note - Virtual Visit via Telephone Telehealth (real-time audio visits between healthcare provider and patient).  Patient's Phone No. & Preferred Pharmacy:  [phone] (home); [phone] (mobile); (Preferred) [phone]  [facility] [place], [place] - [address] [place] [address] Phone: [phone] Fax: [id number] [place], [place] - [place] [address] [place] [address] Phone: [phone] Fax: [phone]   Pre-screening note:  Our staff contacted Ms. Tew and offered her an "in person", "face-to-face" appointment versus a telephone encounter. She indicated preferring the telephone encounter, at this time.  Reason for Virtual Visit: COVID-19*  Social distancing based on CDC and AMA recommendations.   I contacted Kalana Yust on 02/18/2019 at 10:15 AM by telephone and clearly identified myself as Dionisio David, NP. I verified that I was speaking with the correct person using two identifiers (Name and date of birth: 02-Apr-1961).  Advanced Informed Consent I sought verbal advanced consent from [name] for telemedicine interactions and virtual visit. I informed Ms. Altemose of the security and privacy concerns, risks, and limitations associated with performing an evaluation and management service by telephone. I also informed Ms. Crabtree of the availability of "in person" appointments and I informed her of the possibility of a patient responsible charge related to this service. Ms. Noguera expressed understanding and agreed to proceed.   Historic Elements   Ms. Monasia Lair is a 58 y.o. year old, female patient evaluated today after her last encounter by our practice on 01/13/2019. Ms. Phebus  has a past medical history of Abnormal liver enzymes (02/08/2016), Allergy, Anxiety, Chest pain (07/05/2016), Chronic pain,  Depression, DJD (degenerative joint disease), Fibromyalgia, High cholesterol, Rash of back (01/28/2016), and Weight loss due to medication. She also  has a past surgical history that includes Cholecystectomy; Tubal ligation; Ankle surgery; Nasal sinus surgery; Tonsillectomy; Colonoscopy; Esophagogastroduodenoscopy (egd) with propofol (N/A, 02/22/2016); Colonoscopy with propofol (N/A, 02/22/2016); and Breast biopsy (Left, 1997). Ms. Runnion has a current medication list which includes the following prescription(s): baclofen, cholecalciferol, dicyclomine, fluocinonide cream, fluticasone, linzess, lorazepam, multi-vitamins, naloxone, oxycodone, oxycodone, oxycodone, rabeprazole, sucralfate, and trintellix. She  reports that she has been smoking e-cigarettes. She started smoking about 44 years ago. She has a 35.00 pack-year smoking history. She has never used smokeless tobacco. She reports that she does not drink alcohol or use drugs. Ms. Deacon is allergic to other; food; and pea.   HPI  I last saw her on 12/09/2018. She is being evaluated for medication management. She admits that her pain 2/10. She has pain in her upper back and low back. She admits that her pain stays in the area but the right side is worse. She gets a lot of pressure. She is having a lot of problem cleaning herself because of the pain.She admits that would like to retry her injections when the virus is lifted. She is having some numbness in both hands. She also has tightness. She is not sure which procedure was most effective. She has changed her PCP with Dr Gennette Pac with [facility] because of insurance. She was given  prednisone by Dr Lowella Dandy on her last visit was effective. She is no longer taking the Baclofen. She feels like it makes her feel worse the next day.   Pharmacotherapy Assessment  Analgesic:Oxycodone one tablet by mouth 4times a day (20mg /dayof hydrocodone) MME/day:30mg /day   Monitoring: Pharmacotherapy:  No  side-effects or adverse reactions reported. [place] PMP: PDMP not reviewed this encounter.       Compliance: No problems identified. Plan: Refer to "POC".  Review of recent tests  CT Angio Chest PE W and/or Wo Contrast CLINICAL DATA:  Chest pain  EXAM: CT ANGIOGRAPHY CHEST WITH CONTRAST  TECHNIQUE: Multidetector CT imaging of the chest was performed using the standard protocol during bolus administration of intravenous contrast. Multiplanar CT image reconstructions and MIPs were obtained to evaluate the vascular anatomy.  CONTRAST:  154mL OMNIPAQUE IOHEXOL 350 MG/ML SOLN  COMPARISON:  12/07/2018 radiograph, CT chest 11/28/2018  FINDINGS: Cardiovascular: Satisfactory opacification of the pulmonary arteries to the segmental level. No evidence of pulmonary embolism. Nonaneurysmal aorta. Mild cardiomegaly. No pericardial effusion.  Mediastinum/Nodes: Midline trachea. No thyroid mass. No significant adenopathy. Esophagus within normal limits.  Lungs/Pleura: No pleural effusion. No focal consolidation. 6 mm lingular pulmonary nodule, series 6, image number 53.  Upper Abdomen: Surgical clips at the gallbladder fossa. No acute abnormality.  Musculoskeletal: No chest wall abnormality. No acute or significant osseous findings.  Review of the MIP images confirms the above findings.  IMPRESSION: 1. Negative for acute pulmonary embolus. 2. No acute pulmonary infiltrate 3. New 6 mm lingular pulmonary nodule, likely infectious or inflammatory or possibly an intrapulmonary node given rapid development since 11/28/2018.  Electronically Signed   By: Donavan Foil M.D.   On: 12/07/2018 19:56 DG Abdomen Acute W/Chest CLINICAL DATA:  Chest and abdominal pain  EXAM: DG ABDOMEN ACUTE W/ 1V CHEST  COMPARISON:  CT 11/28/2018  FINDINGS: Single-view chest demonstrates linear scar atelectasis at the left base. Normal heart size.  Supine and upright views of the abdomen demonstrate no  free air beneath the diaphragm. Surgical clips in the right upper quadrant. Nonobstructed bowel-gas pattern. No radiopaque calculi.  IMPRESSION: 1. No radiographic evidence for acute cardiopulmonary abnormality. 2. Nonobstructed bowel-gas pattern  Electronically Signed   By: Donavan Foil M.D.   On: 12/07/2018 17:33   Admission on 12/07/2018, Discharged on 12/07/2018  Component Date Value Ref Range Status  . Lipase 12/07/2018 29  11 - 51 U/L Final   Performed at [facility], [address] [place], [place] [address]  . Sodium 12/07/2018 136  135 - 145 mmol/L Final  . Potassium 12/07/2018 3.5  3.5 - 5.1 mmol/L Final  . Chloride 12/07/2018 104  98 - 111 mmol/L Final  . CO2 12/07/2018 24  22 - 32 mmol/L Final  . Glucose, Bld 12/07/2018 173* 70 - 99 mg/dL Final  . BUN 12/07/2018 10  6 - 20 mg/dL Final  . Creatinine, Ser 12/07/2018 0.70  0.44 - 1.00 mg/dL Final  . Calcium 12/07/2018 9.5  8.9 - 10.3 mg/dL Final  . Total Protein 12/07/2018 7.7  6.5 - 8.1 g/dL Final  . Albumin 12/07/2018 4.6  3.5 - 5.0 g/dL Final  . AST 12/07/2018 35  15 - 41 U/L Final  . ALT 12/07/2018 42  0 - 44 U/L Final  . Alkaline Phosphatase 12/07/2018 80  38 - 126 U/L Final  . Total Bilirubin 12/07/2018 0.7  0.3 - 1.2 mg/dL Final  . GFR calc non Af Amer 12/07/2018 >60  >60 mL/min Final  . GFR calc Af Amer 12/07/2018 >60  >60 mL/min Final  . Anion gap 12/07/2018 8  5 - 15 Final   Performed at [facility], [address] [place], [place] [address]  . WBC 12/07/2018 8.9  4.0 - 10.5 K/uL Final  .  RBC 12/07/2018 4.64  3.87 - 5.11 MIL/uL Final  . Hemoglobin 12/07/2018 13.6  12.0 - 15.0 g/dL Final  . HCT 12/07/2018 41.0  36.0 - 46.0 % Final  . MCV 12/07/2018 88.4  80.0 - 100.0 fL Final  . MCH 12/07/2018 29.3  26.0 - 34.0 pg Final  . MCHC 12/07/2018 33.2  30.0 - 36.0 g/dL Final  . RDW 12/07/2018 12.8  11.5 - 15.5 % Final  . Platelets 12/07/2018 336  150 - 400 K/uL Final  . nRBC 12/07/2018 0.0   0.0 - 0.2 % Final   Performed at [facility], [address] [place], [place] [address]  . Color, Urine 12/07/2018 YELLOW* YELLOW Final  . APPearance 12/07/2018 CLEAR* CLEAR Final  . Specific Gravity, Urine 12/07/2018 1.006  1.005 - 1.030 Final  . pH 12/07/2018 6.0  5.0 - 8.0 Final  . Glucose, UA 12/07/2018 NEGATIVE  NEGATIVE mg/dL Final  . Hgb urine dipstick 12/07/2018 NEGATIVE  NEGATIVE Final  . Bilirubin Urine 12/07/2018 NEGATIVE  NEGATIVE Final  . Ketones, ur 12/07/2018 NEGATIVE  NEGATIVE mg/dL Final  . Protein, ur 12/07/2018 NEGATIVE  NEGATIVE mg/dL Final  . Nitrite 12/07/2018 NEGATIVE  NEGATIVE Final  . Leukocytes, UA 12/07/2018 NEGATIVE  NEGATIVE Final  . RBC / HPF 12/07/2018 0-5  0 - 5 RBC/hpf Final  . WBC, UA 12/07/2018 0-5  0 - 5 WBC/hpf Final  . Bacteria, UA 12/07/2018 NONE SEEN  NONE SEEN Final  . Squamous Epithelial / LPF 12/07/2018 0-5  0 - 5 Final  . Mucus 12/07/2018 PRESENT   Final   Performed at [facility], [address], [place], [place] [address]  . Fibrin derivatives D-dimer ([facility]) 12/07/2018 1,056.56* 0.00 - 499.00 ng/mL (FEU) Final   Comment: (NOTE) <> Exclusion of Venous Thromboembolism (VTE) - OUTPATIENT ONLY   (Emergency Department or [place])   0-499 ng/ml (FEU): With a low to intermediate pretest probability                      for VTE this test result excludes the diagnosis                      of VTE.   >499 ng/ml (FEU) : VTE not excluded; additional work up for VTE is                      required. <> Testing on Inpatients and Evaluation of Disseminated Intravascular   Coagulation (DIC) Reference Range:   0-499 ng/ml (FEU) Performed at [facility], [address] [place], [place] [address]    Assessment  The primary encounter diagnosis was Lumbar spondylosis. Diagnoses of Thoracic spondylosis with radiculopathy (Right), Cervicalgia (Bilateral) (L>R), Spondylosis without myelopathy or radiculopathy, cervical region,  and Chronic pain syndrome were also pertinent to this visit.  Plan of Care  I have changed Maciah Royle's oxyCODONE, oxyCODONE, and oxyCODONE. I am also having her maintain her sucralfate, Multi-Vitamins, RABEprazole, fluticasone, naloxone, Linzess, fluocinonide cream, dicyclomine, cholecalciferol, Trintellix, baclofen, and LORazepam.  Pharmacotherapy (Medications Ordered): Meds ordered this encounter  Medications  . oxyCODONE (OXY IR/ROXICODONE) 5 MG immediate release tablet    Sig: Take 1 tablet (5 mg total) by mouth every 6 (six) hours as needed for up to 30 days for severe pain.    Dispense:  120 tablet    Refill:  0    Do not place this medication, or any other prescription from our  practice, on "Automatic Refill". Patient may have prescription filled one day early if pharmacy is closed on scheduled refill date.    Order Specific Question:   Supervising Provider    Answer:   Milinda Pointer [phone]  . oxyCODONE (OXY IR/ROXICODONE) 5 MG immediate release tablet    Sig: Take 1 tablet (5 mg total) by mouth every 6 (six) hours as needed for up to 30 days for severe pain.    Dispense:  120 tablet    Refill:  0    Do not place this medication, or any other prescription from our practice, on "Automatic Refill". Patient may have prescription filled one day early if pharmacy is closed on scheduled refill date.    Order Specific Question:   Supervising Provider    Answer:   Milinda Pointer [phone]  . oxyCODONE (OXY IR/ROXICODONE) 5 MG immediate release tablet    Sig: Take 1 tablet (5 mg total) by mouth every 6 (six) hours as needed for up to 30 days for severe pain.    Dispense:  120 tablet    Refill:  0    Do not place this medication, or any other prescription from our practice, on "Automatic Refill". Patient may have prescription filled one day early if pharmacy is closed on scheduled refill date.    Order Specific Question:   Supervising Provider    Answer:   Milinda Pointer  [phone]   Orders:  No orders of the defined types were placed in this encounter.  Follow-up plan:   Return in about 3 months (around 05/20/2019) for MedMgmt.   I discussed the assessment and treatment plan with the patient. The patient was provided an opportunity to ask questions and all were answered. The patient agreed with the plan and demonstrated an understanding of the instructions.  Patient advised to call back or seek an in-person evaluation if the symptoms or condition worsens.  Total duration of non-face-to-face encounter: 14 minutes.  Note by: Dionisio David, NP Date: 02/18/2019; Time: 10:30 AM  Disclaimer:  * Given the special circumstances of the COVID-19 pandemic, the federal government has announced that the Office for Civil Rights (OCR) will exercise its enforcement discretion and will not impose penalties on physicians using telehealth in the event of noncompliance with regulatory requirements under the [place] and [place] (HIPAA) in connection with the good faith provision of telehealth during the TMHDQ-22 national public health emergency. ([place])

## 2019-02-20 ENCOUNTER — Encounter: Payer: Self-pay | Admitting: Nurse Practitioner

## 2019-02-21 ENCOUNTER — Ambulatory Visit: Payer: Self-pay | Admitting: Family Medicine

## 2019-03-28 ENCOUNTER — Other Ambulatory Visit: Payer: Self-pay | Admitting: Family Medicine

## 2019-05-13 ENCOUNTER — Encounter: Payer: Self-pay | Admitting: Pain Medicine

## 2019-05-14 ENCOUNTER — Ambulatory Visit: Payer: BLUE CROSS/BLUE SHIELD | Attending: Nurse Practitioner | Admitting: Pain Medicine

## 2019-05-14 ENCOUNTER — Other Ambulatory Visit: Payer: Self-pay

## 2019-05-14 DIAGNOSIS — M7918 Myalgia, other site: Secondary | ICD-10-CM

## 2019-05-14 DIAGNOSIS — M25512 Pain in left shoulder: Secondary | ICD-10-CM

## 2019-05-14 DIAGNOSIS — M4802 Spinal stenosis, cervical region: Secondary | ICD-10-CM | POA: Diagnosis not present

## 2019-05-14 DIAGNOSIS — G8929 Other chronic pain: Secondary | ICD-10-CM

## 2019-05-14 DIAGNOSIS — M5412 Radiculopathy, cervical region: Secondary | ICD-10-CM

## 2019-05-14 DIAGNOSIS — M25511 Pain in right shoulder: Secondary | ICD-10-CM

## 2019-05-14 DIAGNOSIS — M62838 Other muscle spasm: Secondary | ICD-10-CM

## 2019-05-14 DIAGNOSIS — G894 Chronic pain syndrome: Secondary | ICD-10-CM | POA: Diagnosis not present

## 2019-05-14 DIAGNOSIS — M542 Cervicalgia: Secondary | ICD-10-CM | POA: Diagnosis not present

## 2019-05-14 DIAGNOSIS — M47812 Spondylosis without myelopathy or radiculopathy, cervical region: Secondary | ICD-10-CM

## 2019-05-14 DIAGNOSIS — M549 Dorsalgia, unspecified: Secondary | ICD-10-CM

## 2019-05-14 MED ORDER — OXYCODONE HCL 5 MG PO TABS: 5.0000 mg | ORAL_TABLET | Freq: Four times a day (QID) | ORAL | 0 refills | Status: DC | PRN

## 2019-05-14 NOTE — Patient Instructions (Addendum)
____________________________________________________________________________________________  Preparing for Procedure with Sedation  Procedure appointments are limited to planned procedures: . No Prescription Refills. . No disability issues will be discussed. . No medication changes will be discussed.  Instructions: . Oral Intake: Do not eat or drink anything for at least 8 hours prior to your procedure. . Transportation: Public transportation is not allowed. Bring an adult driver. The driver must be physically present in our waiting room before any procedure can be started. . Physical Assistance: Bring an adult physically capable of assisting you, in the event you need help. This adult should keep you company at home for at least 6 hours after the procedure. . Blood Pressure Medicine: Take your blood pressure medicine with a sip of water the morning of the procedure. . Blood thinners: Notify our staff if you are taking any blood thinners. Depending on which one you take, there will be specific instructions on how and when to stop it. . Diabetics on insulin: Notify the staff so that you can be scheduled 1st case in the morning. If your diabetes requires high dose insulin, take only  of your normal insulin dose the morning of the procedure and notify the staff that you have done so. . Preventing infections: Shower with an antibacterial soap the morning of your procedure. . Build-up your immune system: Take 1000 mg of Vitamin C with every meal (3 times a day) the day prior to your procedure. . Antibiotics: Inform the staff if you have a condition or reason that requires you to take antibiotics before dental procedures. . Pregnancy: If you are pregnant, call and cancel the procedure. . Sickness: If you have a cold, fever, or any active infections, call and cancel the procedure. . Arrival: You must be in the facility at least 30 minutes prior to your scheduled procedure. . Children: Do not bring  children with you. . Dress appropriately: Bring dark clothing that you would not mind if they get stained. . Valuables: Do not bring any jewelry or valuables.  Reasons to call and reschedule or cancel your procedure: (Following these recommendations will minimize the risk of a serious complication.) . Surgeries: Avoid having procedures within 2 weeks of any surgery. (Avoid for 2 weeks before or after any surgery). . Flu Shots: Avoid having procedures within 2 weeks of a flu shots or . (Avoid for 2 weeks before or after immunizations). . Barium: Avoid having a procedure within 7-10 days after having had a radiological study involving the use of radiological contrast. (Myelograms, Barium swallow or enema study). . Heart attacks: Avoid any elective procedures or surgeries for the initial 6 months after a "Myocardial Infarction" (Heart Attack). . Blood thinners: It is imperative that you stop these medications before procedures. Let us know if you if you take any blood thinner.  . Infection: Avoid procedures during or within two weeks of an infection (including chest colds or gastrointestinal problems). Symptoms associated with infections include: Localized redness, fever, chills, night sweats or profuse sweating, burning sensation when voiding, cough, congestion, stuffiness, runny nose, sore throat, diarrhea, nausea, vomiting, cold or Flu symptoms, recent or current infections. It is specially important if the infection is over the area that we intend to treat. . Heart and lung problems: Symptoms that may suggest an active cardiopulmonary problem include: cough, chest pain, breathing difficulties or shortness of breath, dizziness, ankle swelling, uncontrolled high or unusually low blood pressure, and/or palpitations. If you are experiencing any of these symptoms, cancel your procedure and contact   your primary care physician for an evaluation.  Remember:  Regular Business hours are:  Monday to Thursday  8:00 AM to 4:00 PM  Provider's Schedule: Corisa Montini, MD:  Procedure days: Tuesday and Thursday 7:30 AM to 4:00 PM  Bilal [name], MD:  Procedure days: Monday and Wednesday 7:30 AM to 4:00 PM ____________________________________________________________________________________________   ____________________________________________________________________________________________  Medication Rules  Purpose: To inform patients, and their family members, of our rules and regulations.  Applies to: All patients receiving prescriptions (written or electronic).  Pharmacy of record: Pharmacy where electronic prescriptions will be sent. If written prescriptions are taken to a different pharmacy, please inform the nursing staff. The pharmacy listed in the electronic medical record should be the one where you would like electronic prescriptions to be sent.  Electronic prescriptions: In compliance with the [place] Strengthen Opioid Misuse Prevention (STOP) Act of 2017 (Session Law 2017-74/H243), effective November 06, 2018, all controlled substances must be electronically prescribed. Calling prescriptions to the pharmacy will cease to exist.  Prescription refills: Only during scheduled appointments. Applies to all prescriptions.  NOTE: The following applies primarily to controlled substances (Opioid* Pain Medications).   Patient's responsibilities: 1. Pain Pills: Bring all pain pills to every appointment (except for procedure appointments). 2. Pill Bottles: Bring pills in original pharmacy bottle. Always bring the newest bottle. Bring bottle, even if empty. 3. Medication refills: You are responsible for knowing and keeping track of what medications you take and those you need refilled. The day before your appointment: write a list of all prescriptions that need to be refilled. The day of the appointment: give the list to the admitting nurse. Prescriptions will be written only during  appointments. No prescriptions will be written on procedure days. If you forget a medication: it will not be "Called in", "Faxed", or "electronically sent". You will need to get another appointment to get these prescribed. No early refills. Do not call asking to have your prescription filled early. 4. Prescription Accuracy: You are responsible for carefully inspecting your prescriptions before leaving our office. Have the discharge nurse carefully go over each prescription with you, before taking them home. Make sure that your name is accurately spelled, that your address is correct. Check the name and dose of your medication to make sure it is accurate. Check the number of pills, and the written instructions to make sure they are clear and accurate. Make sure that you are given enough medication to last until your next medication refill appointment. 5. Taking Medication: Take medication as prescribed. When it comes to controlled substances, taking less pills or less frequently than prescribed is permitted and encouraged. Never take more pills than instructed. Never take medication more frequently than prescribed.  6. Inform other Doctors: Always inform, all of your healthcare providers, of all the medications you take. 7. Pain Medication from other Providers: You are not allowed to accept any additional pain medication from any other Doctor or Healthcare provider. There are two exceptions to this rule. (see below) In the event that you require additional pain medication, you are responsible for notifying us, as stated below. 8. Medication Agreement: You are responsible for carefully reading and following our Medication Agreement. This must be signed before receiving any prescriptions from our practice. Safely store a copy of your signed Agreement. Violations to the Agreement will result in no further prescriptions. (Additional copies of our Medication Agreement are available upon request.) 9. Laws, Rules,  & Regulations: All patients are expected to follow all Federal   and State Laws, Statutes, Rules, & Regulations. Ignorance of the Laws does not constitute a valid excuse. The use of any illegal substances is prohibited. 10. Adopted CDC guidelines & recommendations: Target dosing levels will be at or below 60 MME/day. Use of benzodiazepines** is not recommended.  Exceptions: There are only two exceptions to the rule of not receiving pain medications from other Healthcare Providers. 1. Exception #1 (Emergencies): In the event of an emergency (i.e.: accident requiring emergency care), you are allowed to receive additional pain medication. However, you are responsible for: As soon as you are able, call our office [phone], at any time of the day or night, and leave a message stating your name, the date and nature of the emergency, and the name and dose of the medication prescribed. In the event that your call is answered by a member of our staff, make sure to document and save the date, time, and the name of the person that took your information.  2. Exception #2 (Planned Surgery): In the event that you are scheduled by another doctor or dentist to have any type of surgery or procedure, you are allowed (for a period no longer than 30 days), to receive additional pain medication, for the acute post-op pain. However, in this case, you are responsible for picking up a copy of our "Post-op Pain Management for Surgeons" handout, and giving it to your surgeon or dentist. This document is available at our office, and does not require an appointment to obtain it. Simply go to our office during business hours (Monday-Thursday from 8:00 AM to 4:00 PM) (Friday 8:00 AM to 12:00 Noon) or if you have a scheduled appointment with us, prior to your surgery, and ask for it by name. In addition, you will need to provide us with your name, name of your surgeon, type of surgery, and date of procedure or surgery.  *Opioid  medications include: morphine, codeine, oxycodone, oxymorphone, hydrocodone, hydromorphone, meperidine, tramadol, tapentadol, buprenorphine, fentanyl, methadone. **Benzodiazepine medications include: diazepam (Valium), alprazolam (Xanax), clonazepam (Klonopine), lorazepam (Ativan), clorazepate (Tranxene), chlordiazepoxide (Librium), estazolam (Prosom), oxazepam (Serax), temazepam (Restoril), triazolam (Halcion) (Last updated: 01/03/2018) ____________________________________________________________________________________________   ____________________________________________________________________________________________  Medication Recommendations and Reminders  Applies to: All patients receiving prescriptions (written and/or electronic).  Medication Rules & Regulations: These rules and regulations exist for your safety and that of others. They are not flexible and neither are we. Dismissing or ignoring them will be considered "non-compliance" with medication therapy, resulting in complete and irreversible termination of such therapy. (See document titled "Medication Rules" for more details.) In all conscience, because of safety reasons, we cannot continue providing a therapy where the patient does not follow instructions.  Pharmacy of record:   Definition: This is the pharmacy where your electronic prescriptions will be sent.   We do not endorse any particular pharmacy.  You are not restricted in your choice of pharmacy.  The pharmacy listed in the electronic medical record should be the one where you want electronic prescriptions to be sent.  If you choose to change pharmacy, simply notify our nursing staff of your choice of new pharmacy.  Recommendations:  Keep all of your pain medications in a safe place, under lock and key, even if you live alone.   After you fill your prescription, take 1 week's worth of pills and put them away in a safe place. You should keep a separate,  properly labeled bottle for this purpose. The remainder should be kept in the original bottle. Use   Use this as your primary supply, until it runs out. Once it's gone, then you know that you have 1 week's worth of medicine, and it is time to come in for a prescription refill. If you do this correctly, it is unlikely that you will ever run out of medicine.  To make sure that the above recommendation works, it is very important that you make sure your medication refill appointments are scheduled at least 1 week before you run out of medicine. To do this in an effective manner, make sure that you do not leave the office without scheduling your next medication management appointment. Always ask the nursing staff to show you in your prescription , when your medication will be running out. Then arrange for the receptionist to get you a return appointment, at least 7 days before you run out of medicine. Do not wait until you have 1 or 2 pills left, to come in. This is very poor planning and does not take into consideration that we may need to cancel appointments due to bad weather, sickness, or emergencies affecting our staff.  "Partial Fill": If for any reason your pharmacy does not have enough pills/tablets to completely fill or refill your prescription, do not allow for a "partial fill". You will need a separate prescription to fill the remaining amount, which we will not provide. If the reason for the partial fill is your insurance, you will need to talk to the pharmacist about payment alternatives for the remaining tablets, but again, do not accept a partial fill.  Prescription refills and/or changes in medication(s):   Prescription refills, and/or changes in dose or medication, will be conducted only during scheduled medication management appointments. (Applies to both, written and electronic prescriptions.)  No refills on procedure days. No medication will be changed or started on procedure days. No changes,  adjustments, and/or refills will be conducted on a procedure day. Doing so will interfere with the diagnostic portion of the procedure.  No phone refills. No medications will be "called into the pharmacy".  No Fax refills.  No weekend refills.  No Holliday refills.  No after hours refills.  Remember:  Business hours are:  Monday to Thursday 8:00 AM to 4:00 PM Provider's Schedule: Dionisio David, NP - Appointments are:  Medication management: Monday to Thursday 8:00 AM to 4:00 PM Milinda Pointer, MD - Appointments are:  Medication management: Monday and Wednesday 8:00 AM to 4:00 PM Procedure day: Tuesday and Thursday 7:30 AM to 4:00 PM Gillis Santa, MD - Appointments are:  Medication management: Tuesday and Thursday 8:00 AM to 4:00 PM Procedure day: Monday and Wednesday 7:30 AM to 4:00 PM (Last update: 01/03/2018) ____________________________________________________________________________________________

## 2019-05-14 NOTE — Progress Notes (Signed)
Pain Management Virtual Encounter Note - Virtual Visit via Telephone Telehealth (real-time audio visits between healthcare provider and patient).   Patient's Phone No. & Preferred Pharmacy:  [phone] (home); [phone] (mobile); (Preferred) [phone] [email or web address]  [id number] Nicole Walter, [address] [place] [address] Phone: [phone] Fax: [phone]    Pre-screening note:  Our staff contacted Nicole Walter and offered her an "in person", "face-to-face" appointment versus a telephone encounter. She indicated preferring the telephone encounter, at this time.   Reason for Virtual Visit: COVID-19*  Social distancing based on CDC and AMA recommendations.   I contacted Nicole Walter on 05/14/2019 via telephone.      I clearly identified myself as Gaspar Cola, MD. I verified that I was speaking with the correct person using two identifiers (Name: Nicole Walter, and date of birth: Jan 28, 1961).  Advanced Informed Consent I sought verbal advanced consent from Nicole Walter for virtual visit interactions. I informed Nicole Walter of possible security and privacy concerns, risks, and limitations associated with providing "not-in-person" medical evaluation and management services. I also informed Nicole Walter of the availability of "in-person" appointments. Finally, I informed her that there would be a charge for the virtual visit and that she could be  personally, fully or partially, financially responsible for it. Nicole Walter expressed understanding and agreed to proceed.   Historic Elements   Nicole Walter is a 58 y.o. year old, female patient evaluated today after her last encounter by our practice on 02/18/2019. Nicole Walter  has a past medical history of Abnormal liver enzymes (02/08/2016), Allergy, Anxiety, Chest pain (07/05/2016), Chronic pain, Depression, DJD (degenerative joint disease),  Fibromyalgia, High cholesterol, Rash of back (01/28/2016), and Weight loss due to medication. She also  has a past surgical history that includes Cholecystectomy; Tubal ligation; Ankle surgery; Nasal sinus surgery; Tonsillectomy; Colonoscopy; Esophagogastroduodenoscopy (egd) with propofol (N/A, 02/22/2016); Colonoscopy with propofol (N/A, 02/22/2016); and Breast biopsy (Left, 1997). Nicole Walter has a current medication list which includes the following prescription(s): cholecalciferol, dicyclomine, fexofenadine, fluocinonide cream, fluticasone, lorazepam, multi-vitamins, naloxone, oxycodone, oxycodone, oxycodone, rabeprazole, sucralfate, trintellix, and baclofen. She  reports that she has been smoking e-cigarettes. She started smoking about 45 years ago. She has a 35.00 pack-year smoking history. She has never used smokeless tobacco. She reports that she does not drink alcohol or use drugs. Nicole Walter is allergic to other; food; and pea.   HPI  Today, she is being contacted for medication management.  According to the patient, she seems to be doing okay on her medications.  She is not reporting any side effects or adverse reactions.  However, she continues to have pain in the neck, shoulders, upper back, and signs and symptoms of cervical radiculitis affecting both upper extremities.  In view of this, I have offered her a left-sided cervical epidural steroid injection under fluoroscopic guidance and IV sedation.  Today I spent a lot of time trying to explain to the patient the difference between the pain that she is experiencing now and that of the neck pain and headaches.  The pain that she is currently experiencing is likely secondary to a C5 radiculitis.  The patient has radiological evidence of severe bilateral C5-6 foraminal stenosis, which is likely to be responsible for the C6 component of her pain.  However, she is also having symptoms compatible with a bilateral C5 radiculitis.  Today I attempted to explain to  the patient that this is completely different to her neck pain and headaches, which are usually associated with the bilateral cervical facet syndrome that she has.  Unfortunately, we did a radiofrequency in the cervical region which did cause a medial branch neuritis, and now she is afraid that that will happen every time that we do the radiofrequency.  She has forgotten that we told her that this is a possible complication that can occur in less than 19% of cases done in the cervical region.  However, this is the time limited and in her case, it has already improved.  The patient is convinced that her pain is primarily secondary to muscle issues and so today I had to also explained to her about fibromyalgia and how this is a diagnosis of exclusion.  She asked me if I could do the test to diagnose fibromyalgia and I had to explain to her that there was not a particular test that we could do that would determine that the patient has fibromyalgia.  Apparently this line of questioning came as a consequence of her thinking about stopping her Ativan and switching her to Valium.  Despite my best efforts to explain this to the patient, she does not seem to understand that the documented problem that she has with the cervical, thoracic, and lumbar facet arthropathy, is directly related to some of the muscle symptoms that she is experiencing.  I have explained to her that they medial branch nerve will innervate not only the facet joint, but also the paravertebral muscles, causing increase tone and fatigue of those muscles, as a consequence of the facet joint being irritated.  In any case once she told me about the idea of her changing her Ativan for Valium, I took this opportunity to go over the CDC opioid guidelines, which are very specific in recommending that patients do not mix opioids and benzodiazepines due to the drug to drug interactions that may cause respiratory depression and death.  Very appropriately the  patient indicated that she already has tolerance to and uses Ativan and that since she has not had any problems with respiratory depression, it is unlikely that she will have it to the Valium.  However, I pointed out the fact that they are some studies that would suggest that Ativan, being a long-acting benzodiazepine, has a much better safety profile than Valium when he comes to the drug drug interactions between the benzodiazepines and the opioids.  Today I have tried to explain all this in great detail to the patient but I will also put this information in the chart so that when she goes to talk to her primary care physician, he can look at my note and understand better they issues that I tried to convey to the patient.  Pharmacotherapy Assessment  Analgesic: Oxycodone IR 5 mg q 6 hrs (20mg /dayof oxycodone) MME/day:30mg /day.   Monitoring: Pharmacotherapy: No side-effects or adverse reactions reported. [place] PMP: PDMP reviewed during this encounter.       Compliance: No problems identified. Effectiveness: Clinically acceptable. Plan: Refer to "POC".  Pertinent Labs   SAFETY SCREENING Profile Lab Results  Component Value Date   HCVAB NEGATIVE 02/14/2017   Renal Function Lab Results  Component Value Date   BUN 10 12/07/2018   CREATININE 0.70 12/07/2018   GFRAA >60 12/07/2018   GFRNONAA >60 12/07/2018   Hepatic Function Lab Results  Component Value Date   AST 35 12/07/2018   ALT 42 12/07/2018   ALBUMIN 4.6 12/07/2018  UDS Summary  Date Value Ref Range Status  08/19/2018 FINAL  Final    Comment:    ==================================================================== TOXASSURE SELECT 13 (MW) ==================================================================== Test                             Result       Flag       Units Drug Present and Declared for Prescription Verification   Lorazepam                      1903         EXPECTED   ng/mg creat    Source of lorazepam is a  scheduled prescription medication.   Oxycodone                      127          EXPECTED   ng/mg creat   Oxymorphone                    395          EXPECTED   ng/mg creat   Noroxycodone                   1203         EXPECTED   ng/mg creat   Noroxymorphone                 265          EXPECTED   ng/mg creat    Sources of oxycodone are scheduled prescription medications.    Oxymorphone, noroxycodone, and noroxymorphone are expected    metabolites of oxycodone. Oxymorphone is also available as a    scheduled prescription medication. ==================================================================== Test                      Result    Flag   Units      Ref Range   Creatinine              98               mg/dL      >=20 ==================================================================== Declared Medications:  The flagging and interpretation on this report are based on the  following declared medications.  Unexpected results may arise from  inaccuracies in the declared medications.  **Note: The testing scope of this panel includes these medications:  Lorazepam (Ativan)  Oxycodone (Roxicodone)  **Note: The testing scope of this panel does not include following  reported medications:  Fluticasone (Flonase)  Gabapentin  Ibuprofen  Linaclotide (Linzess)  Multivitamin  Naloxone (Narcan)  Omega-3 Fatty Acids (Fish Oil)  Rabeprazole (Aciphex)  Sucralfate (Carafate)  Topical (Lidex)  Vortioxetine (Trintellix) ==================================================================== For clinical consultation, please call [phone]. ====================================================================    Note: Above Lab results reviewed.  Recent imaging  CT Angio Chest PE W and/or Wo Contrast CLINICAL DATA:  Chest pain  EXAM: CT ANGIOGRAPHY CHEST WITH CONTRAST  TECHNIQUE: Multidetector CT imaging of the chest was performed using the standard protocol during bolus administration  of intravenous contrast. Multiplanar CT image reconstructions and MIPs were obtained to evaluate the vascular anatomy.  CONTRAST:  148mL OMNIPAQUE IOHEXOL 350 MG/ML SOLN  COMPARISON:  12/07/2018 radiograph, CT chest 11/28/2018  FINDINGS: Cardiovascular: Satisfactory opacification of the pulmonary arteries to the segmental level. No evidence of pulmonary embolism. Nonaneurysmal aorta. Mild cardiomegaly. No pericardial effusion.  Mediastinum/Nodes:  Midline trachea. No thyroid mass. No significant adenopathy. Esophagus within normal limits.  Lungs/Pleura: No pleural effusion. No focal consolidation. 6 mm lingular pulmonary nodule, series 6, image number 53.  Upper Abdomen: Surgical clips at the gallbladder fossa. No acute abnormality.  Musculoskeletal: No chest wall abnormality. No acute or significant osseous findings.  Review of the MIP images confirms the above findings.  IMPRESSION: 1. Negative for acute pulmonary embolus. 2. No acute pulmonary infiltrate 3. New 6 mm lingular pulmonary nodule, likely infectious or inflammatory or possibly an intrapulmonary node given rapid development since 11/28/2018.  Electronically Signed   By: Donavan Foil M.D.   On: 12/07/2018 19:56 DG Abdomen Acute W/Chest CLINICAL DATA:  Chest and abdominal pain  EXAM: DG ABDOMEN ACUTE W/ 1V CHEST  COMPARISON:  CT 11/28/2018  FINDINGS: Single-view chest demonstrates linear scar atelectasis at the left base. Normal heart size.  Supine and upright views of the abdomen demonstrate no free air beneath the diaphragm. Surgical clips in the right upper quadrant. Nonobstructed bowel-gas pattern. No radiopaque calculi.  IMPRESSION: 1. No radiographic evidence for acute cardiopulmonary abnormality. 2. Nonobstructed bowel-gas pattern  Electronically Signed   By: Donavan Foil M.D.   On: 12/07/2018 17:33  Assessment  The primary encounter diagnosis was Chronic pain syndrome. Diagnoses of  Chronic neck pain (Primary Area of Pain) (Bilateral) (L>R), Cervical sensory radiculopathy at C5 (Bilateral), Cervical Foraminal Stenosis (Severe) (Bilateral: C-5-6), Radicular pain of shoulder (Bilateral) (C5), Chronic upper back pain (Secondary Area of Pain) (Bilateral) (L>R), Chronic shoulder pain (Tertiary Area of Pain) (Bilateral) (L>R), Cervical facet syndrome (Bilateral) (L>R), Cervicalgia (Bilateral) (L>R), Muscle spasm, Myofascial pain syndrome, cervical (trapezius) (Left), and Chronic musculoskeletal pain were also pertinent to this visit.  Plan of Care  I have discontinued Leshay Sedberry's Linzess, oxyCODONE, and oxyCODONE. I have also changed her oxyCODONE. Additionally, I am having her start on oxyCODONE and oxyCODONE. Lastly, I am having her maintain her sucralfate, Multi-Vitamins, RABEprazole, naloxone, fluocinonide cream, dicyclomine, cholecalciferol, Trintellix, baclofen, LORazepam, fluticasone, and fexofenadine.  Pharmacotherapy (Medications Ordered): Meds ordered this encounter  Medications  . oxyCODONE (OXY IR/ROXICODONE) 5 MG immediate release tablet    Sig: Take 1 tablet (5 mg total) by mouth every 6 (six) hours as needed for severe pain. Must last 30 days    Dispense:  120 tablet    Refill:  0    Chronic Pain: STOP Act (Not applicable) Fill 1 day early if closed on refill date. Do not fill until: 06/02/2019. To last until: 07/02/2019. Avoid benzodiazepines within 8 hours of opioids  . oxyCODONE (OXY IR/ROXICODONE) 5 MG immediate release tablet    Sig: Take 1 tablet (5 mg total) by mouth every 6 (six) hours as needed for severe pain. Must last 30 days    Dispense:  120 tablet    Refill:  0    Chronic Pain: STOP Act (Not applicable) Fill 1 day early if closed on refill date. Do not fill until: 07/02/2019. To last until: 08/01/2019. Avoid benzodiazepines within 8 hours of opioids  . oxyCODONE (OXY IR/ROXICODONE) 5 MG immediate release tablet    Sig: Take 1 tablet (5 mg total) by  mouth every 6 (six) hours as needed for severe pain. Must last 30 days    Dispense:  120 tablet    Refill:  0    Chronic Pain: STOP Act (Not applicable) Fill 1 day early if closed on refill date. Do not fill until: 08/01/2019. To last until: 08/31/2019. Avoid benzodiazepines within 8  hours of opioids   Orders:  Orders Placed This Encounter  Procedures  . Cervical Epidural Injection    Level(s): C7-T1 Laterality: Left-sided Purpose: Diagnostic/Therapeutic Indication(s): Radiculitis and cervicalgia associater with cervical degenerative disc disease.    Standing Status:   Future    Standing Expiration Date:   06/14/2019    Scheduling Instructions:     Procedure: Cervical Epidural Steroid Injection/Block     Sedation: With Sedation.     Timeframe: As soon as schedule allows    Order Specific Question:   Where will this procedure be performed?    Answer:   [facility]    Comments:   by Dr. Dossie Arbour   Follow-up plan:   Return in about 15 weeks (around 08/27/2019) for (VV), E/M (MM), in addition, Procedure (w/ sedation): (L) CESI #3.     Interventional management options: Considering:   Pending: Schedule her for therapeutic right cervical facet RFA #1under fluoro and IV sedation left rhomboid TPI PRN Diagnostic bilateral cervical facet block #4  Possible right cervical facet RFA #1  Diagnostic left-sided CESI #3  Diagnostic left intra-articular shoulder joint injection #2  Diagnostic right intra-articular shoulder joint injection #1  Diagnostic right suprascapular nerve block #2  Diagnostic left suprascapular nerve block #1  Possible bilateral suprascapular nerve RFA #1  Diagnostic bilateral lumbar facet block  Possible bilateral lumbar facet RFA  Diagnostic right intra-articular hip joint injection #3 Diagnostic left intra-articular hip joint injection #2  Diagnostic bilateral femoral and obturator tickler branch blocks Possible bilateral hip joint RFA  Diagnosticright  intra-articular knee joint injection #2 Therapeutic right-sided, [place]al[place] knee joint injections series #2.  (Last completed on 05/16/2017) Possible right sided genicular nerve block  Possible right sided genicular nerve RFA  Possible left-sided lumbar epidural steroid injection Therapeutic right-sided thoracic ESI #3    Palliative PRN treatment(s):   Palliativebilateral suprascapular nerve block Palliative left cervical facet RFA #2 (last done on 03/28/2018)    Recent Visits Date Type Provider Dept  02/18/19 Office Visit Vevelyn Francois, NP [facility]  Showing recent visits within past 90 days and meeting all other requirements   Today's Visits Date Type Provider Dept  05/14/19 Office Visit Milinda Pointer, MD [facility]  Showing today's visits and meeting all other requirements   Future Appointments No visits were found meeting these conditions.  Showing future appointments within next 90 days and meeting all other requirements   I discussed the assessment and treatment plan with the patient. The patient was provided an opportunity to ask questions and all were answered. The patient agreed with the plan and demonstrated an understanding of the instructions.  Patient advised to call back or seek an in-person evaluation if the symptoms or condition worsens.  Total duration of non-face-to-face encounter: 30 minutes.  Note by: Gaspar Cola, MD Date: 05/14/2019; Time: 11:04 AM  Note: This dictation was prepared with Dragon dictation. Any transcriptional errors that may result from this process are unintentional.  Disclaimer:  * Given the special circumstances of the COVID-19 pandemic, the federal government has announced that the Office for Civil Rights (OCR) will exercise its enforcement discretion and will not impose penalties on physicians using telehealth in the event of noncompliance with regulatory requirements under the [place] (HIPAA) in connection with the good faith provision of telehealth during the NOIBB-04 national public health emergency. ([place])

## 2019-05-16 ENCOUNTER — Other Ambulatory Visit: Payer: Self-pay | Admitting: Family Medicine

## 2019-05-19 ENCOUNTER — Telehealth: Payer: Self-pay

## 2019-05-19 DIAGNOSIS — Z20822 Contact with and (suspected) exposure to covid-19: Secondary | ICD-10-CM

## 2019-05-19 NOTE — Telephone Encounter (Signed)
Nicole Walter with [facility] request COVID 19 test for exposure. Message left to call back and schedule. Order placed. Practice [phone] Fax [phone]

## 2019-05-29 ENCOUNTER — Ambulatory Visit: Payer: BLUE CROSS/BLUE SHIELD | Admitting: Pain Medicine

## 2019-06-09 ENCOUNTER — Telehealth: Payer: Self-pay | Admitting: Pain Medicine

## 2019-06-09 ENCOUNTER — Telehealth: Payer: Self-pay

## 2019-06-09 DIAGNOSIS — M5030 Other cervical disc degeneration, unspecified cervical region: Secondary | ICD-10-CM | POA: Insufficient documentation

## 2019-06-09 NOTE — Progress Notes (Deleted)
Patient's Name: Nicole Walter  MRN: [id number]  Referring Provider: Milinda Pointer, MD  DOB: 1961-05-20  PCP: Nicole Marble, MD  DOS: 06/10/2019  Note by: Nicole Cola, MD  Service setting: Ambulatory outpatient  Specialty: Interventional Pain Management  Patient type: Established  Location: [facility] (AMB) Pain Management Facility  Visit type: Interventional Procedure   Primary Reason for Visit: Interventional Pain Management Treatment. CC: No chief complaint on file.  Procedure:          Anesthesia, Analgesia, Anxiolysis:  Type: Diagnostic, Inter-Laminar, Cervical Epidural Steroid Injection           Region: Posterior Cervico-thoracic Region Level: C7-T1 Laterality: Left-Sided Paramedial  Type: Moderate (Conscious) Sedation combined with Local Anesthesia Indication(s): Analgesia and Anxiety Route: Intravenous (IV) IV Access: Secured Sedation: Meaningful verbal contact was maintained at all times during the procedure  Local Anesthetic: Lidocaine 1-2%  Position: Prone with head of the table was raised to facilitate breathing.   Indications: 1. DDD (degenerative disc disease), cervical   2. Cervicalgia (Bilateral) (L>R)   3. Cervical spondylosis with radiculopathy (Bilateral) (L>R)   4. Cervical spondylosis (Bilateral)   5. Cervical sensory radiculopathy at C5 (Bilateral)   6. Cervical central spinal stenosis    Pain Score: Pre-procedure:  /10 Post-procedure:  /10   Pre-op Assessment:  [name] is a 58 y.o. (year old), female patient, seen today for interventional treatment. She  has a past surgical history that includes Cholecystectomy; Tubal ligation; Ankle surgery; Nasal sinus surgery; Tonsillectomy; Colonoscopy; Esophagogastroduodenoscopy (egd) with propofol (N/A, 02/22/2016); Colonoscopy with propofol (N/A, 02/22/2016); and Breast biopsy (Left, 1997). [name] has a current medication list which includes the following prescription(s): baclofen, cholecalciferol,  dicyclomine, fexofenadine, fluocinonide cream, fluticasone, lorazepam, multi-vitamins, naloxone, oxycodone, oxycodone, oxycodone, rabeprazole, sucralfate, and trintellix. Her primarily concern today is the No chief complaint on file.  Initial Vital Signs:  Pulse/HCG Rate:    Temp:   Resp:   BP:   SpO2:    BMI: Estimated body mass index is 28.49 kg/m as calculated from the following:   Height as of 01/13/19: 5\' 4"  (1.626 m).   Weight as of 01/13/19: 166 lb (75.3 kg).  Risk Assessment: Allergies: Reviewed. She is allergic to other; food; and pea.  Allergy Precautions: None required Coagulopathies: Reviewed. None identified.  Blood-thinner therapy: None at this time Active Infection(s): Reviewed. None identified. [name] is afebrile  Site Confirmation: [name] was asked to confirm the procedure and laterality before marking the site Procedure checklist: Completed Consent: Before the procedure and under the influence of no sedative(s), amnesic(s), or anxiolytics, the patient was informed of the treatment options, risks and possible complications. To fulfill our ethical and legal obligations, as recommended by the American Medical Association's Code of Ethics, I have informed the patient of my clinical impression; the nature and purpose of the treatment or procedure; the risks, benefits, and possible complications of the intervention; the alternatives, including doing nothing; the risk(s) and benefit(s) of the alternative treatment(s) or procedure(s); and the risk(s) and benefit(s) of doing nothing. The patient was provided information about the general risks and possible complications associated with the procedure. These may include, but are not limited to: failure to achieve desired goals, infection, bleeding, organ or nerve damage, allergic reactions, paralysis, and death. In addition, the patient was informed of those risks and complications associated to Spine-related procedures, such as  failure to decrease pain; infection (i.e.: Meningitis, epidural or intraspinal abscess); bleeding (i.e.: epidural hematoma, subarachnoid hemorrhage, or  any other type of intraspinal or peri-dural bleeding); organ or nerve damage (i.e.: Any type of peripheral nerve, nerve root, or spinal cord injury) with subsequent damage to sensory, motor, and/or autonomic systems, resulting in permanent pain, numbness, and/or weakness of one or several areas of the body; allergic reactions; (i.e.: anaphylactic reaction); and/or death. Furthermore, the patient was informed of those risks and complications associated with the medications. These include, but are not limited to: allergic reactions (i.e.: anaphylactic or anaphylactoid reaction(s)); adrenal axis suppression; blood sugar elevation that in diabetics may result in ketoacidosis or comma; water retention that in patients with history of congestive heart failure may result in shortness of breath, pulmonary edema, and decompensation with resultant heart failure; weight gain; swelling or edema; medication-induced neural toxicity; particulate matter embolism and blood vessel occlusion with resultant organ, and/or nervous system infarction; and/or aseptic necrosis of one or more joints. Finally, the patient was informed that Medicine is not an exact science; therefore, there is also the possibility of unforeseen or unpredictable risks and/or possible complications that may result in a catastrophic outcome. The patient indicated having understood very clearly. We have given the patient no guarantees and we have made no promises. Enough time was given to the patient to ask questions, all of which were answered to the patient's satisfaction. [name] has indicated that she wanted to continue with the procedure. Attestation: I, the ordering provider, attest that I have discussed with the patient the benefits, risks, side-effects, alternatives, likelihood of achieving goals, and  potential problems during recovery for the procedure that I have provided informed consent. Date  Time: {CHL [facility]-PAIN TIME CHOICES:[id number]}  Pre-Procedure Preparation:  Monitoring: As per clinic protocol. Respiration, ETCO2, SpO2, BP, heart rate and rhythm monitor placed and checked for adequate function Safety Precautions: Patient was assessed for positional comfort and pressure points before starting the procedure. Time-out: I initiated and conducted the "Time-out" before starting the procedure, as per protocol. The patient was asked to participate by confirming the accuracy of the "Time Out" information. Verification of the correct person, site, and procedure were performed and confirmed by me, the nursing staff, and the patient. "Time-out" conducted as per Joint Commission's Universal Protocol (UP.01.01.01). Time:    Description of Procedure:          Target Area: For Epidural Steroid injections the target is the interlaminar space, initially targeting the lower border of the superior vertebral body lamina. Approach: Paramedial approach. Area Prepped: Entire PosteriorCervical Region Prepping solution: DuraPrep (Iodine Povacrylex [0.7% available iodine] and Isopropyl Alcohol, 74% w/w) Safety Precautions: Aspiration looking for blood return was conducted prior to all injections. At no point did we inject any substances, as a needle was being advanced. No attempts were made at seeking any paresthesias. Safe injection practices and needle disposal techniques used. Medications properly checked for expiration dates. SDV (single dose vial) medications used. Description of the Procedure: Protocol guidelines were followed. The procedure needle was introduced through the skin, ipsilateral to the reported pain, and advanced to the target area. Bone was contacted and the needle walked caudad, until the lamina was cleared. The epidural space was identified using "loss-of-resistance technique" with 2-3 ml of  PF-NaCl (0.9% NSS), in a 5cc LOR glass syringe. There were no vitals filed for this visit.  Start Time:   hrs. End Time:   hrs. Materials:  Needle(s) Type: Epidural needle Gauge: 17G Length: 3.5-in Medication(s): Please see orders for medications and dosing details.  Imaging Guidance (Spinal):  Type of Imaging Technique: Fluoroscopy Guidance (Spinal) Indication(s): Assistance in needle guidance and placement for procedures requiring needle placement in or near specific anatomical locations not easily accessible without such assistance. Exposure Time: Please see nurses notes. Contrast: Before injecting any contrast, we confirmed that the patient did not have an allergy to iodine, shellfish, or radiological contrast. Once satisfactory needle placement was completed at the desired level, radiological contrast was injected. Contrast injected under live fluoroscopy. No contrast complications. See chart for type and volume of contrast used. Fluoroscopic Guidance: I was personally present during the use of fluoroscopy. "Tunnel Vision Technique" used to obtain the best possible view of the target area. Parallax error corrected before commencing the procedure. "Direction-depth-direction" technique used to introduce the needle under continuous pulsed fluoroscopy. Once target was reached, antero-posterior, oblique, and lateral fluoroscopic projection used confirm needle placement in all planes. Images permanently stored in EMR. Interpretation: I personally interpreted the imaging intraoperatively. Adequate needle placement confirmed in multiple planes. Appropriate spread of contrast into desired area was observed. No evidence of afferent or efferent intravascular uptake. No intrathecal or subarachnoid spread observed. Permanent images saved into the patient's record.  Antibiotic Prophylaxis:   Anti-infectives (From admission, onward)   None     Indication(s): None identified  Post-operative  Assessment:  Post-procedure Vital Signs:  Pulse/HCG Rate:    Temp:   Resp:   BP:   SpO2:    EBL: None  Complications: No immediate post-treatment complications observed by team, or reported by patient.  Note: The patient tolerated the entire procedure well. A repeat set of vitals were taken after the procedure and the patient was kept under observation following institutional policy, for this type of procedure. Post-procedural neurological assessment was performed, showing return to baseline, prior to discharge. The patient was provided with post-procedure discharge instructions, including a section on how to identify potential problems. Should any problems arise concerning this procedure, the patient was given instructions to immediately contact us, at any time, without hesitation. In any case, we plan to contact the patient by telephone for a follow-up status report regarding this interventional procedure.  Comments:  No additional relevant information.  Plan of Care  Orders:  No orders of the defined types were placed in this encounter.  Chronic Opioid Analgesic:  Oxycodone IR 5 mg q 6 hrs (20mg /dayof oxycodone) MME/day:30mg /day.   Medications ordered for procedure: No orders of the defined types were placed in this encounter.  Medications administered: Sherlie Boyum had no medications administered during this visit.  See the medical record for exact dosing, route, and time of administration.  Follow-up plan:   No follow-ups on file.       Interventional management options: Considering:   Possible right cervical facet RFA  Diagnostic right IA shoulder joint injection   Diagnostic left suprascapular NB  Possible bilateral suprascapular nerve RFA  Diagnostic bilateral lumbar facet block  Possible bilateral lumbar facet RFA  Diagnostic bilateral femoral and obturator NB Possible bilateral hip joint RFA  Possible right sided genicular NB  Possible right sided  genicular nerve RFA  Possible left-sided LESI   Palliative PRN treatment(s):   Palliative/Diagnostic right suprascapular NB #2  Palliative left IA shoulder joint injection #2  Palliative/therapeutic left CESI #4  Palliative bilateral cervical facet block #4  Palliative left cervical facet RFA #2 (last done on 03/28/2018)  Palliative right IA hip joint injection #3 Palliative left IA hip joint injection #2  Palliative/Therapeutic right-sided thoracic ESI #3  Diagnosticright intra-articular knee  joint injection #2 Therapeutic right-sided, Hyalgan knee joint injections series #2 (Last done 05/16/2017)    Recent Visits Date Type Provider Dept  05/14/19 Office Visit Nicole Pointer, MD [facility]  Showing recent visits within past 90 days and meeting all other requirements   Future Appointments Date Type Provider Dept  06/10/19 Appointment Nicole Pointer, MD [facility]  08/27/19 Appointment Nicole Pointer, MD [facility]  Showing future appointments within next 90 days and meeting all other requirements   Disposition: Discharge home  Discharge Date & Time: 06/10/2019;   hrs.   Primary Care Physician: Nicole Marble, MD Location: [facility] Note by: Nicole Cola, MD Date: 06/10/2019; Time: 6:16 AM  Disclaimer:  Medicine is not an Chief Strategy Officer. The only guarantee in medicine is that nothing is guaranteed. It is important to note that the decision to proceed with this intervention was based on the information collected from the patient. The Data and conclusions were drawn from the patient's questionnaire, the interview, and the physical examination. Because the information was provided in large part by the patient, it cannot be guaranteed that it has not been purposely or unconsciously manipulated. Every effort has been made to obtain as much relevant data as possible for this evaluation. It is important to  note that the conclusions that lead to this procedure are derived in large part from the available data. Always take into account that the treatment will also be dependent on availability of resources and existing treatment guidelines, considered by other Pain Management Practitioners as being common knowledge and practice, at the time of the intervention. For Medico-Legal purposes, it is also important to point out that variation in procedural techniques and pharmacological choices are the acceptable norm. The indications, contraindications, technique, and results of the above procedure should only be interpreted and judged by a Board-Certified Interventional Pain Specialist with extensive familiarity and expertise in the same exact procedure and technique.

## 2019-06-09 NOTE — Telephone Encounter (Signed)
The patient is having trouble getting her insurance to pay for her care here so she cancelled her procedure tomorrow. She wants to know if you will call out a steroid pack for her since she cant come for her procedure.

## 2019-06-09 NOTE — Telephone Encounter (Signed)
Lotus Santillo left vmail on Friday July 31 stating she is unable to keep coming for appts as her insurance says Dr. Dossie Arbour is out of network with her insurance [facility]. They gave her a number to call to verify he is in network. [phone]. Please let her know what is determined and if she can come here in network.

## 2019-06-10 ENCOUNTER — Ambulatory Visit: Payer: BLUE CROSS/BLUE SHIELD | Admitting: Pain Medicine

## 2019-07-03 ENCOUNTER — Telehealth: Payer: Self-pay

## 2019-07-03 NOTE — Telephone Encounter (Signed)
Dr. Dossie Arbour, please address this.

## 2019-07-03 NOTE — Telephone Encounter (Signed)
The patient called back on 06/09/19 and asked for a steroid dose pack and it was never addressed. She called back today inquiring about it. She had to cancel her procedure due to her insurance.

## 2019-07-03 NOTE — Telephone Encounter (Signed)
She also wants a left shoulder MRI ordered as well.

## 2019-07-04 ENCOUNTER — Other Ambulatory Visit: Payer: Self-pay | Admitting: Pain Medicine

## 2019-07-04 NOTE — Telephone Encounter (Signed)
Appointment needed

## 2019-07-16 NOTE — Telephone Encounter (Signed)
Called patient and scheduled for Tuesday.

## 2019-07-16 NOTE — Telephone Encounter (Signed)
Has appt 08-27-19. Please call to ask if she wants sooner appt

## 2019-07-21 ENCOUNTER — Telehealth: Payer: Self-pay | Admitting: Pain Medicine

## 2019-07-21 ENCOUNTER — Encounter: Payer: Self-pay | Admitting: Pain Medicine

## 2019-07-21 ENCOUNTER — Telehealth: Payer: Self-pay | Admitting: *Deleted

## 2019-07-21 ENCOUNTER — Other Ambulatory Visit: Payer: Self-pay | Admitting: Family Medicine

## 2019-07-21 NOTE — Telephone Encounter (Signed)
Patient returned call to [facility] about medication update for appt on 07-22-19

## 2019-07-22 ENCOUNTER — Other Ambulatory Visit: Payer: Self-pay

## 2019-07-22 ENCOUNTER — Ambulatory Visit: Payer: BLUE CROSS/BLUE SHIELD | Attending: Pain Medicine | Admitting: Pain Medicine

## 2019-07-22 DIAGNOSIS — G894 Chronic pain syndrome: Secondary | ICD-10-CM

## 2019-07-22 DIAGNOSIS — M62838 Other muscle spasm: Secondary | ICD-10-CM

## 2019-07-22 DIAGNOSIS — M7918 Myalgia, other site: Secondary | ICD-10-CM

## 2019-07-22 DIAGNOSIS — M4802 Spinal stenosis, cervical region: Secondary | ICD-10-CM

## 2019-07-22 DIAGNOSIS — M4722 Other spondylosis with radiculopathy, cervical region: Secondary | ICD-10-CM

## 2019-07-22 DIAGNOSIS — M542 Cervicalgia: Secondary | ICD-10-CM | POA: Diagnosis not present

## 2019-07-22 DIAGNOSIS — R937 Abnormal findings on diagnostic imaging of other parts of musculoskeletal system: Secondary | ICD-10-CM | POA: Insufficient documentation

## 2019-07-22 DIAGNOSIS — M797 Fibromyalgia: Secondary | ICD-10-CM

## 2019-07-22 DIAGNOSIS — M792 Neuralgia and neuritis, unspecified: Secondary | ICD-10-CM | POA: Insufficient documentation

## 2019-07-22 DIAGNOSIS — M5030 Other cervical disc degeneration, unspecified cervical region: Secondary | ICD-10-CM

## 2019-07-22 DIAGNOSIS — M549 Dorsalgia, unspecified: Secondary | ICD-10-CM

## 2019-07-22 DIAGNOSIS — F411 Generalized anxiety disorder: Secondary | ICD-10-CM

## 2019-07-22 DIAGNOSIS — M25512 Pain in left shoulder: Secondary | ICD-10-CM

## 2019-07-22 DIAGNOSIS — M5412 Radiculopathy, cervical region: Secondary | ICD-10-CM

## 2019-07-22 DIAGNOSIS — G8929 Other chronic pain: Secondary | ICD-10-CM

## 2019-07-22 DIAGNOSIS — M25511 Pain in right shoulder: Secondary | ICD-10-CM

## 2019-07-22 DIAGNOSIS — F1190 Opioid use, unspecified, uncomplicated: Secondary | ICD-10-CM

## 2019-07-22 MED ORDER — OXYCODONE HCL 5 MG PO TABS: 5.0000 mg | ORAL_TABLET | Freq: Four times a day (QID) | ORAL | 0 refills | Status: DC | PRN

## 2019-07-22 MED ORDER — TIZANIDINE HCL 4 MG PO TABS: 4.0000 mg | ORAL_TABLET | Freq: Two times a day (BID) | ORAL | 2 refills | Status: DC | PRN

## 2019-07-22 MED ORDER — NARCAN 4 MG/0.1ML NA LIQD: NASAL | 1 refills | Status: DC

## 2019-07-22 NOTE — Patient Instructions (Signed)

## 2019-07-22 NOTE — Progress Notes (Signed)
Pain Management Virtual Encounter Note - Virtual Visit via Telephone Telehealth (real-time audio visits between healthcare provider and patient).   Patient's Phone No. & Preferred Pharmacy:  [phone] (home); [phone] (mobile); (Preferred) [phone] [email or web address]  [id number] [address], [place] [address] [place] [address] Phone: [phone] Fax: [phone]    Pre-screening note:  Our staff contacted Nicole Walter and offered her an "in person", "face-to-face" appointment versus a telephone encounter. She indicated preferring the telephone encounter, at this time.   Reason for Virtual Visit: COVID-19*  Social distancing based on CDC and AMA recommendations.   I contacted Nicole Walter on 07/22/2019 via telephone.      I clearly identified myself as Gaspar Cola, MD. I verified that I was speaking with the correct person using two identifiers (Name: Nicole Walter, and date of birth: Jun 19, 1961).  Advanced Informed Consent I sought verbal advanced consent from Nicole Walter for virtual visit interactions. I informed Nicole Walter of possible security and privacy concerns, risks, and limitations associated with providing "not-in-person" medical evaluation and management services. I also informed Nicole Walter of the availability of "in-person" appointments. Finally, I informed her that there would be a charge for the virtual visit and that she could be  personally, fully or partially, financially responsible for it. Nicole Walter expressed understanding and agreed to proceed.   Historic Elements   Nicole Walter is a 58 y.o. year old, female patient evaluated today after her last encounter by our practice on 07/21/2019. Nicole Walter  has a past medical history of Abnormal liver enzymes (02/08/2016), Allergy, Anxiety, Chest pain (07/05/2016), Chronic pain, Depression, DJD (degenerative joint disease),  Fibromyalgia, High cholesterol, Rash of back (01/28/2016), and Weight loss due to medication. She also  has a past surgical history that includes Cholecystectomy; Tubal ligation; Ankle surgery; Nasal sinus surgery; Tonsillectomy; Colonoscopy; Esophagogastroduodenoscopy (egd) with propofol (N/A, 02/22/2016); Colonoscopy with propofol (N/A, 02/22/2016); and Breast biopsy (Left, 1997). Nicole Walter has a current medication list which includes the following prescription(s): belsomra, cholecalciferol, dicyclomine, fexofenadine, fluocinonide cream, fluticasone, linzess, lorazepam, multi-vitamins, narcan, oxycodone, oxycodone, rabeprazole, sucralfate, tizanidine, trintellix, and oxycodone. She  reports that she has been smoking e-cigarettes. She started smoking about 45 years ago. She has a 35.00 pack-year smoking history. She has never used smokeless tobacco. She reports that she does not drink alcohol or use drugs. Nicole Walter is allergic to other; food; and pea.   HPI  Today, she is being contacted for follow-up evaluation.  We had requested a left-sided cervical epidural steroid injection #3 under fluoroscopic guidance and IV sedation.  However, this appears to have been denied by the insurance company.  The patient already had a neurosurgical consult and physical therapy on 2018 after her cervical MRI.  The patient already had a gabapentin trial that went as high as 600 mg p.o. twice daily.  The patient indicates that she stopped the gabapentin after she gained some weight and did not get any benefit from this 600 mg p.o. twice daily.  She also confirmed having had a Lyrica trial in the past and although she did well with a initial low dosing, she was not getting any significant benefit and as soon as this dose was increased she started having problems with chest pain and difficulty breathing and she had to stop the medication.  Today she had a lot of questions all of which I answered.  She  also has been having a lot of  problems with the left shoulder.  She has a history of right shoulder problems for which she ended up having some surgery.  At this point she indicates the left shoulder to be just as bad as the right side was.  Although I think that some of the left shoulder pain is coming from the cervical region, she is not want to proceed with a cervical epidural steroid injection or any other treatments in the neck region until we have confirmed that the shoulder is not the one pain that she is experiencing in that area.  A plain x-ray of the shoulder was completely negative.  Because of this, today I will be ordering an MRI of the left shoulder since the plain films were nondiagnostic and she continues to have problems with that shoulder.  I will be seeing her back after the MRI to decide if we need to work with the shoulder, the cervical region, or both.  She has already had cervical epidural steroid injections and cervical facet blocks as well as radiofrequency and due to a bad experience after a radiofrequency, she has decided that she wants to hold on those for now.  She indicates having had a lot of pain after 1 of the radiofrequencies.  Pharmacotherapy Assessment  Analgesic: Oxycodone IR 5 mg q 6 hrs (73m/dayof oxycodone) MME/day:360mday.   Monitoring: Pharmacotherapy: No side-effects or adverse reactions reported. [place] PMP: PDMP reviewed during this encounter.       Compliance: No problems identified. Effectiveness: Clinically acceptable. Plan: Refer to "POC".  UDS:  Summary  Date Value Ref Range Status  08/19/2018 FINAL  Final    Comment:    ==================================================================== TOXASSURE SELECT 13 (MW) ==================================================================== Test                             Result       Flag       Units Drug Present and Declared for Prescription Verification   Lorazepam                      1903         EXPECTED   ng/mg creat     Source of lorazepam is a scheduled prescription medication.   Oxycodone                      127          EXPECTED   ng/mg creat   Oxymorphone                    395          EXPECTED   ng/mg creat   Noroxycodone                   1203         EXPECTED   ng/mg creat   Noroxymorphone                 265          EXPECTED   ng/mg creat    Sources of oxycodone are scheduled prescription medications.    Oxymorphone, noroxycodone, and noroxymorphone are expected    metabolites of oxycodone. Oxymorphone is also available as a    scheduled prescription medication. ==================================================================== Test  Result    Flag   Units      Ref Range   Creatinine              98               mg/dL      >=20 ==================================================================== Declared Medications:  The flagging and interpretation on this report are based on the  following declared medications.  Unexpected results may arise from  inaccuracies in the declared medications.  **Note: The testing scope of this panel includes these medications:  Lorazepam (Ativan)  Oxycodone (Roxicodone)  **Note: The testing scope of this panel does not include following  reported medications:  Fluticasone (Flonase)  Gabapentin  Ibuprofen  Linaclotide (Linzess)  Multivitamin  Naloxone (Narcan)  Omega-3 Fatty Acids (Fish Oil)  Rabeprazole (Aciphex)  Sucralfate (Carafate)  Topical (Lidex)  Vortioxetine (Trintellix) ==================================================================== For clinical consultation, please call [phone]. ====================================================================    Laboratory Chemistry Profile (12 mo)  Renal: 12/07/2018: BUN 10; Creatinine, Ser 0.70  Lab Results  Component Value Date   GFR 86.13 10/08/2018   GFRAA >60 12/07/2018   GFRNONAA >60 12/07/2018   Hepatic: 12/07/2018: Albumin 4.6 Lab Results  Component Value  Date   AST 35 12/07/2018   ALT 42 12/07/2018   Other: 10/11/2018: VITD 51.75 Note: Above Lab results reviewed.  Imaging  Last 90 days:  No results found.  Assessment  The primary encounter diagnosis was Chronic pain syndrome. Diagnoses of Chronic neck pain (Primary Area of Pain) (Bilateral) (L>R), Cervicalgia (Bilateral) (L>R), Cervical Foraminal Stenosis (Severe) (Bilateral: C5-6), Cervical sensory radiculopathy at C5 (Bilateral), Cervical spondylosis with radiculopathy (Bilateral) (L>R), Chronic upper back pain (Secondary Area of Pain) (Bilateral) (L>R), Chronic shoulder pain (Tertiary Area of Pain) (Bilateral) (L>R), Generalized anxiety disorder, DDD (degenerative disc disease), cervical, Abnormal MRI, cervical spine (09/04/2017), Muscle spasm, Myofascial pain syndrome, cervical (trapezius) (Left), Opiate use, Fibromyalgia, Neurogenic pain, and Chronic shoulder pain (Left) were also pertinent to this visit.  Plan of Care  I have discontinued Nicole Walter's baclofen. I have changed her naloxone to Narcan. I have also changed her tiZANidine. Additionally, I am having her maintain her sucralfate, Multi-Vitamins, RABEprazole, fluocinonide cream, dicyclomine, cholecalciferol, LORazepam, fluticasone, fexofenadine, oxyCODONE, oxyCODONE, Trintellix, Linzess, Belsomra, and oxyCODONE.  Pharmacotherapy (Medications Ordered): Meds ordered this encounter  Medications  . naloxone (NARCAN) 4 MG/0.1ML LIQD nasal spray kit    Sig: Spray into one nostril. Repeat with second device into other nostril after 2-3 minutes if no or minimal response. Use in case of opioid overdose.    Dispense:  1 each    Refill:  1    Narcan Nasal Spray. (2 pack) Please provide the patient with clear instructions on the use of this device/medication.  Marland Kitchen tiZANidine (ZANAFLEX) 4 MG tablet    Sig: Take 1 tablet (4 mg total) by mouth 2 (two) times daily as needed for muscle spasms.    Dispense:  60 tablet    Refill:  2    Fill  one day early if pharmacy is closed on scheduled refill date. May substitute for generic if available.  Marland Kitchen oxyCODONE (OXY IR/ROXICODONE) 5 MG immediate release tablet    Sig: Take 1 tablet (5 mg total) by mouth every 6 (six) hours as needed for severe pain. Must last 30 days    Dispense:  120 tablet    Refill:  0    Chronic Pain: STOP Act (Not applicable) Fill 1 day early if closed on refill date. Do  not fill until: 08/31/2019. To last until: 09/30/2019. Avoid benzodiazepines within 8 hours of opioids   Orders:  Orders Placed This Encounter  Procedures  . MR SHOULDER LEFT WO CONTRAST    Standing Status:   Future    Standing Expiration Date:   09/20/2020    Order Specific Question:   What is the patient's sedation requirement?    Answer:   Oral Sedation    Order Specific Question:   Does the patient have a pacemaker or implanted devices?    Answer:   Yes    Order Specific Question:   Preferred imaging location?    Answer:   [facility] (table limit-350lbs)    Order Specific Question:   Call Results- Best Contact Number?    Answer:   [phone]    Order Specific Question:   Radiology Contrast Protocol - do NOT remove file path    Answer:   [email or web address]   Follow-up plan:   Return in about 2 months (around 09/29/2019) for scheduled appointment.      Interventional management options: Considering:   Possible right cervical facet RFA   Diagnostic right IA shoulder joint injection   Diagnostic left suprascapular NB   Possible bilateral suprascapular nerve RFA   Diagnostic bilateral lumbar facet block  Possible bilateral lumbar facet RFA  Diagnostic bilateral femoral and obturator NB Possible bilateral hip joint RFA  Possible right genicular NB  Possible right genicular nerve RFA  Possible left LESI   Palliative PRN treatment(s):   Palliative left CESI #3  Palliativebilateral suprascapular NB Palliative bilateral cervical facet block  #4  Palliative left cervical facet RFA #2 (last done on 03/28/2018)  Palliative right thoracic ESI #3  Diagnostic left IA shoulder joint injection #2  Diagnostic right suprascapular nerve block #2  Diagnosticright IA knee joint injection #2 Therapeutic right Hyalgan knee joint injections series #2.  (Last completed on 05/16/2017)  Palliative right IA hip joint injection #3 Diagnostic left IA hip joint injection #2     Recent Visits Date Type Provider Dept  05/14/19 Office Visit Milinda Pointer, MD [facility]  Showing recent visits within past 90 days and meeting all other requirements   Today's Visits Date Type Provider Dept  07/22/19 Office Visit Milinda Pointer, MD [facility]  Showing today's visits and meeting all other requirements   Future Appointments Date Type Provider Dept  08/27/19 Appointment Milinda Pointer, MD [facility]  Showing future appointments within next 90 days and meeting all other requirements   I discussed the assessment and treatment plan with the patient. The patient was provided an opportunity to ask questions and all were answered. The patient agreed with the plan and demonstrated an understanding of the instructions.  Patient advised to call back or seek an in-person evaluation if the symptoms or condition worsens.  Total duration of non-face-to-face encounter: 25 minutes.  Note by: Gaspar Cola, MD Date: 07/22/2019; Time: 5:06 PM  Note: This dictation was prepared with Dragon dictation. Any transcriptional errors that may result from this process are unintentional.  Disclaimer:  * Given the special circumstances of the COVID-19 pandemic, the federal government has announced that the Office for Civil Rights (OCR) will exercise its enforcement discretion and will not impose penalties on physicians using telehealth in the event of noncompliance with regulatory requirements under the [place] (HIPAA) in connection with the good faith provision of telehealth during the QVZDG-38 national public health emergency. ([place])

## 2019-07-23 MED ORDER — NARCAN 4 MG/0.1ML NA LIQD: NASAL | 1 refills | Status: DC

## 2019-07-23 NOTE — Addendum Note (Signed)
Addended by: Milinda Pointer A on: 07/23/2019 01:20 PM   Modules accepted: Orders

## 2019-07-24 ENCOUNTER — Telehealth: Payer: Self-pay | Admitting: *Deleted

## 2019-08-05 ENCOUNTER — Ambulatory Visit: Payer: BLUE CROSS/BLUE SHIELD

## 2019-08-08 ENCOUNTER — Ambulatory Visit: Payer: BLUE CROSS/BLUE SHIELD

## 2019-08-27 ENCOUNTER — Ambulatory Visit
Admission: RE | Admit: 2019-08-27 | Discharge: 2019-08-27 | Disposition: A | Discharge status: Home / Self Care | Payer: BLUE CROSS/BLUE SHIELD | Source: Ambulatory Visit | Attending: Pain Medicine | Admitting: Pain Medicine

## 2019-08-27 ENCOUNTER — Other Ambulatory Visit: Payer: Self-pay

## 2019-08-27 ENCOUNTER — Ambulatory Visit: Payer: BLUE CROSS/BLUE SHIELD | Admitting: Pain Medicine

## 2019-08-27 DIAGNOSIS — G8929 Other chronic pain: Secondary | ICD-10-CM | POA: Diagnosis present

## 2019-08-27 DIAGNOSIS — M25512 Pain in left shoulder: Secondary | ICD-10-CM | POA: Insufficient documentation

## 2019-08-27 DIAGNOSIS — M25511 Pain in right shoulder: Secondary | ICD-10-CM | POA: Diagnosis present

## 2019-09-01 ENCOUNTER — Other Ambulatory Visit: Payer: Self-pay | Admitting: Pain Medicine

## 2019-09-01 ENCOUNTER — Telehealth: Payer: Self-pay

## 2019-09-01 DIAGNOSIS — M25512 Pain in left shoulder: Secondary | ICD-10-CM

## 2019-09-01 DIAGNOSIS — G8929 Other chronic pain: Secondary | ICD-10-CM

## 2019-09-01 NOTE — Telephone Encounter (Signed)
Her MRI results are in and she wants DR. Naveira to look at them and tell her where to go from here

## 2019-09-01 NOTE — Progress Notes (Signed)
Today I had a chance to review the patient's MRI, I would suggest that she follow-up with an orthopedic surgeon for further evaluation.  I can probably offer her some shoulder injections if she is interested in that, but I still recommend that she has the shoulder and the MRI evaluated by an orthopedic surgeon.

## 2019-09-01 NOTE — Telephone Encounter (Signed)
Informed patient that appt is needed to discuss MRI result and plan. Dr. Dossie Arbour is not a provider with her insurance and she cannot afford another appt. Next appt is late November. Sent Dr. Dossie Arbour a message informing him of this.

## 2019-09-02 ENCOUNTER — Telehealth: Payer: Self-pay | Admitting: Pain Medicine

## 2019-09-02 NOTE — Telephone Encounter (Signed)
Patient wants to know if she has some injections done at [facility] will this affect her standing with Dr. Dossie Arbour. Her insurance has Dr. Dossie Arbour out of network. Dr. Dossie Arbour has referred her to [facility].

## 2019-09-02 NOTE — Telephone Encounter (Signed)
Spoke with patient and she is really having a difficult time getting insurance figured out.  Dr Dossie Arbour had referred her to orthopedic surgeon. Dr Dossie Arbour, supposedly is no longer in her Network and she can't afford to have injections here.  The orthopedic had offered to give her an injection in her shoulder until she could get in to get her some pain relief and she was scheduled for that.  Dr Ronni Rumble office called on yesterday and stated they could not do the injection because she was in pain management. I told patient there were no rules about who could treat her pain as long as she did not take any pain medication from other providers it should be okay especially since she is having problems with insurance.  Told her I would forward this message to [employer] to see if there has been any clarification for insurance and then if not, maybe Dr Dossie Arbour could communicate with Dr Thomasene Lot that it is okay to proceed with injections at his discretion.

## 2019-09-02 NOTE — Telephone Encounter (Signed)
Error

## 2019-09-02 NOTE — Telephone Encounter (Signed)
I just spoke with Insurance. This has been going on for about 6 months. Dr. Dossie Arbour is out of Network according to her Insurance.   Patient went over to [facility] yesterday and talked to Dr. Posey Pronto and was told she could get some injections there. She was told they have to have ok from Dr. Dossie Arbour before they will do this.

## 2019-09-02 NOTE — Telephone Encounter (Signed)
Ebony Hail from [facility] called stating they need written ok from Dr. Dossie Arbour before Dr. Candelaria Stagers will give pain injections to [facility].   Ebony Hail [phone] Fax [phone]

## 2019-09-03 ENCOUNTER — Other Ambulatory Visit: Payer: Self-pay | Admitting: Pain Medicine

## 2019-09-03 NOTE — Telephone Encounter (Signed)
I have repeatedly explained to the patient that there is nothing else I can do for this problem. Her insurance dictates who is in or out of network, not [place]. While the facility is in network, Dr. Dossie Arbour and all of our doctors are out of network for her insurance. Jocelyn Lamer and I have spoken with the head of billing on this. We cannot change who her insurance dictates is out of network. According to Juliann Pulse, Dr. Dossie Arbour has referred her to [facility] for injections since they are in network and evidently they need authorization from Dr. Dossie Arbour before they will see her for injections.

## 2019-09-03 NOTE — Telephone Encounter (Signed)
Clarification of Medication Agreement faxed to [facility] at [facility].

## 2019-09-16 ENCOUNTER — Encounter: Payer: Self-pay | Admitting: *Deleted

## 2019-09-16 ENCOUNTER — Other Ambulatory Visit: Payer: Self-pay

## 2019-09-16 ENCOUNTER — Other Ambulatory Visit: Payer: Self-pay | Admitting: Family Medicine

## 2019-09-16 ENCOUNTER — Other Ambulatory Visit: Payer: Self-pay | Admitting: Internal Medicine

## 2019-09-16 ENCOUNTER — Other Ambulatory Visit: Payer: Self-pay | Admitting: Pain Medicine

## 2019-09-16 DIAGNOSIS — Z1231 Encounter for screening mammogram for malignant neoplasm of breast: Secondary | ICD-10-CM

## 2019-09-16 DIAGNOSIS — M7918 Myalgia, other site: Secondary | ICD-10-CM

## 2019-09-16 DIAGNOSIS — M62838 Other muscle spasm: Secondary | ICD-10-CM

## 2019-09-18 ENCOUNTER — Other Ambulatory Visit: Payer: Self-pay | Admitting: Pain Medicine

## 2019-09-18 ENCOUNTER — Other Ambulatory Visit: Payer: Self-pay | Admitting: Orthopedic Surgery

## 2019-09-18 DIAGNOSIS — M62838 Other muscle spasm: Secondary | ICD-10-CM

## 2019-09-18 DIAGNOSIS — M7918 Myalgia, other site: Secondary | ICD-10-CM

## 2019-09-19 ENCOUNTER — Other Ambulatory Visit: Payer: Self-pay

## 2019-09-19 ENCOUNTER — Other Ambulatory Visit
Admission: RE | Admit: 2019-09-19 | Discharge: 2019-09-19 | Disposition: A | Discharge status: Home / Self Care | Payer: BLUE CROSS/BLUE SHIELD | Source: Ambulatory Visit | Attending: Orthopedic Surgery | Admitting: Orthopedic Surgery

## 2019-09-19 DIAGNOSIS — Z20828 Contact with and (suspected) exposure to other viral communicable diseases: Secondary | ICD-10-CM | POA: Insufficient documentation

## 2019-09-19 DIAGNOSIS — Z01812 Encounter for preprocedural laboratory examination: Secondary | ICD-10-CM | POA: Diagnosis present

## 2019-09-19 LAB — SARS CORONAVIRUS 2 (TAT 6-24 HRS): SARS Coronavirus 2: NEGATIVE

## 2019-09-22 ENCOUNTER — Other Ambulatory Visit: Payer: Self-pay | Admitting: Pain Medicine

## 2019-09-22 NOTE — Anesthesia Preprocedure Evaluation (Addendum)
Anesthesia Evaluation  Patient identified by MRN, date of birth, ID band Patient awake    Reviewed: Allergy & Precautions, NPO status , Patient's Chart, lab work & pertinent test results  History of Anesthesia Complications Negative for: history of anesthetic complications  Airway Mallampati: III   Neck ROM: Full    Dental  (+)  [place] :   Pulmonary Current Smoker (vaping) and Patient abstained from smoking.,    Pulmonary exam normal breath sounds clear to auscultation       Cardiovascular Exercise Tolerance: Good Normal cardiovascular exam Rhythm:Regular Rate:Normal  ECG 12/07/18: NSR, incomplete RBBB, nonspecific T wave abnormality   Neuro/Psych PSYCHIATRIC DISORDERS Anxiety Depression negative neurological ROS     GI/Hepatic GERD  ,  Endo/Other  negative endocrine ROS  Renal/GU negative Renal ROS     Musculoskeletal  (+) Arthritis , Fibromyalgia -  Abdominal   Peds  Hematology negative hematology ROS (+)   Anesthesia Other Findings   Reproductive/Obstetrics                            Anesthesia Physical Anesthesia Plan  ASA: II  Anesthesia Plan: General and Regional   Post-op Pain Management:  Regional for Post-op pain and GA combined w/ Regional for post-op pain   Induction: Intravenous  PONV Risk Score and Plan: 2 and Dexamethasone, Ondansetron and Treatment may vary due to age or medical condition  Airway Management Planned: LMA  Additional Equipment:   Intra-op Plan:   Post-operative Plan: Extubation in OR  Informed Consent: I have reviewed the patients History and Physical, chart, labs and discussed the procedure including the risks, benefits and alternatives for the proposed anesthesia with the patient or authorized representative who has indicated his/her understanding and acceptance.       Plan Discussed with: CRNA  Anesthesia Plan Comments:         Anesthesia Quick Evaluation

## 2019-09-22 NOTE — Telephone Encounter (Signed)
Spoke with patient and she does not need Rx sent to [place].  However, when she was in [place] for her brothers funeral and filled the tizanidine, that voided the rest of the Rx.  So she is asking for another Rx to be sent.  I explained to patient that Dr Dossie Arbour does not like to send in Rx's apart of [facility] appts which she is scheduled for on 09/29/19.  Asked patient if she could wait until that time to discuss with him.  Patient is agreeable to this.

## 2019-09-22 NOTE — Telephone Encounter (Signed)
Pharmacy in [place] [phone] called asking for patients script for Tizanidine to be sent to them as the patient is requesting to fill it there.

## 2019-09-23 ENCOUNTER — Ambulatory Visit: Payer: BLUE CROSS/BLUE SHIELD | Admitting: Anesthesiology

## 2019-09-23 ENCOUNTER — Encounter
Admission: RE | Disposition: A | Discharge status: Home / Self Care | Payer: Self-pay | Source: Ambulatory Visit | Attending: Orthopedic Surgery

## 2019-09-23 ENCOUNTER — Other Ambulatory Visit: Payer: Self-pay

## 2019-09-23 ENCOUNTER — Ambulatory Visit
Admission: RE | Admit: 2019-09-23 | Discharge: 2019-09-23 | Disposition: A | Discharge status: Home / Self Care | Payer: BLUE CROSS/BLUE SHIELD | Source: Ambulatory Visit | Attending: Orthopedic Surgery | Admitting: Orthopedic Surgery

## 2019-09-23 DIAGNOSIS — M75102 Unspecified rotator cuff tear or rupture of left shoulder, not specified as traumatic: Secondary | ICD-10-CM | POA: Insufficient documentation

## 2019-09-23 DIAGNOSIS — M25812 Other specified joint disorders, left shoulder: Secondary | ICD-10-CM | POA: Insufficient documentation

## 2019-09-23 DIAGNOSIS — M797 Fibromyalgia: Secondary | ICD-10-CM | POA: Insufficient documentation

## 2019-09-23 DIAGNOSIS — F418 Other specified anxiety disorders: Secondary | ICD-10-CM | POA: Diagnosis not present

## 2019-09-23 DIAGNOSIS — G894 Chronic pain syndrome: Secondary | ICD-10-CM | POA: Insufficient documentation

## 2019-09-23 DIAGNOSIS — M7522 Bicipital tendinitis, left shoulder: Secondary | ICD-10-CM | POA: Diagnosis not present

## 2019-09-23 DIAGNOSIS — F17290 Nicotine dependence, other tobacco product, uncomplicated: Secondary | ICD-10-CM | POA: Diagnosis not present

## 2019-09-23 DIAGNOSIS — Z79899 Other long term (current) drug therapy: Secondary | ICD-10-CM | POA: Diagnosis not present

## 2019-09-23 DIAGNOSIS — K219 Gastro-esophageal reflux disease without esophagitis: Secondary | ICD-10-CM | POA: Insufficient documentation

## 2019-09-23 DIAGNOSIS — E785 Hyperlipidemia, unspecified: Secondary | ICD-10-CM | POA: Insufficient documentation

## 2019-09-23 DIAGNOSIS — Z79891 Long term (current) use of opiate analgesic: Secondary | ICD-10-CM | POA: Diagnosis not present

## 2019-09-23 DIAGNOSIS — M19011 Primary osteoarthritis, right shoulder: Secondary | ICD-10-CM | POA: Insufficient documentation

## 2019-09-23 HISTORY — PX: SHOULDER ARTHROSCOPY WITH ROTATOR CUFF REPAIR AND SUBACROMIAL DECOMPRESSION: SHX5686

## 2019-09-23 SURGERY — SHOULDER ARTHROSCOPY WITH ROTATOR CUFF REPAIR AND SUBACROMIAL DECOMPRESSION
Anesthesia: Regional | Site: Shoulder | Laterality: Left

## 2019-09-23 MED ORDER — CEFAZOLIN SODIUM-DEXTROSE 2-4 GM/100ML-% IV SOLN
2.0000 g | INTRAVENOUS | Status: AC
  Administered 2019-09-23: 2 g via INTRAVENOUS

## 2019-09-23 MED ORDER — LACTATED RINGERS IV SOLN
INTRAVENOUS | Status: DC | PRN
  Administered 2019-09-23: 4 mL

## 2019-09-23 MED ORDER — ONDANSETRON 4 MG PO TBDP: 4.0000 mg | ORAL_TABLET | Freq: Three times a day (TID) | ORAL | 0 refills | Status: DC | PRN

## 2019-09-23 MED ORDER — ASPIRIN EC 325 MG PO TBEC: 325.0000 mg | DELAYED_RELEASE_TABLET | Freq: Every day | ORAL | 0 refills | Status: AC

## 2019-09-23 MED ORDER — LACTATED RINGERS IV SOLN
10.0000 mL/h | INTRAVENOUS | Status: DC
  Administered 2019-09-23: 15:00:00 via INTRAVENOUS
  Administered 2019-09-23: 10 mL/h via INTRAVENOUS

## 2019-09-23 MED ORDER — LACTATED RINGERS IR SOLN
Status: DC | PRN
  Administered 2019-09-23: 4
  Administered 2019-09-23: 5
  Administered 2019-09-23: 2
  Administered 2019-09-23: 4

## 2019-09-23 MED ORDER — MIDAZOLAM HCL 2 MG/2ML IJ SOLN
INTRAMUSCULAR | Status: DC | PRN
  Administered 2019-09-23: 2 mg via INTRAVENOUS

## 2019-09-23 MED ORDER — ONDANSETRON HCL 4 MG/2ML IJ SOLN
4.0000 mg | Freq: Once | INTRAMUSCULAR | Status: AC | PRN
  Administered 2019-09-23: 4 mg via INTRAVENOUS

## 2019-09-23 MED ORDER — OXYCODONE HCL 5 MG/5ML PO SOLN: 5.0000 mg | Freq: Once | ORAL | Status: AC | PRN

## 2019-09-23 MED ORDER — CHLORHEXIDINE GLUCONATE 4 % EX LIQD: 60.0000 mL | Freq: Once | CUTANEOUS | Status: DC

## 2019-09-23 MED ORDER — FENTANYL CITRATE (PF) 100 MCG/2ML IJ SOLN
INTRAMUSCULAR | Status: DC | PRN
  Administered 2019-09-23: 50 ug via INTRAVENOUS

## 2019-09-23 MED ORDER — ACETAMINOPHEN 500 MG PO TABS: 1000.0000 mg | ORAL_TABLET | Freq: Three times a day (TID) | ORAL | 2 refills | Status: AC

## 2019-09-23 MED ORDER — DEXAMETHASONE SODIUM PHOSPHATE 4 MG/ML IJ SOLN
INTRAMUSCULAR | Status: DC | PRN
  Administered 2019-09-23: 4 mg via INTRAVENOUS

## 2019-09-23 MED ORDER — OXYCODONE HCL 5 MG PO TABS: 5.0000 mg | ORAL_TABLET | ORAL | 0 refills | Status: DC | PRN

## 2019-09-23 MED ORDER — BUPIVACAINE HCL (PF) 0.25 % IJ SOLN
INTRAMUSCULAR | Status: DC | PRN
  Administered 2019-09-23: 30 mL

## 2019-09-23 MED ORDER — LACTATED RINGERS IV SOLN: INTRAVENOUS | Status: DC

## 2019-09-23 MED ORDER — PROPOFOL 10 MG/ML IV BOLUS
INTRAVENOUS | Status: DC | PRN
  Administered 2019-09-23: 180 mg via INTRAVENOUS

## 2019-09-23 MED ORDER — FENTANYL CITRATE (PF) 100 MCG/2ML IJ SOLN: 25.0000 ug | INTRAMUSCULAR | Status: DC | PRN

## 2019-09-23 MED ORDER — EPHEDRINE SULFATE 50 MG/ML IJ SOLN
INTRAMUSCULAR | Status: DC | PRN
  Administered 2019-09-23: 5 mg via INTRAVENOUS

## 2019-09-23 MED ORDER — BUPIVACAINE LIPOSOME 1.3 % IJ SUSP
INTRAMUSCULAR | Status: DC | PRN
  Administered 2019-09-23: 20 mL

## 2019-09-23 MED ORDER — OXYCODONE HCL 5 MG PO TABS
5.0000 mg | ORAL_TABLET | Freq: Once | ORAL | Status: AC | PRN
  Administered 2019-09-23: 16:00:00 5 mg via ORAL

## 2019-09-23 MED ORDER — ACETAMINOPHEN 10 MG/ML IV SOLN
1000.0000 mg | Freq: Once | INTRAVENOUS | Status: DC | PRN
  Administered 2019-09-23: 1000 mg via INTRAVENOUS

## 2019-09-23 MED ORDER — ONDANSETRON HCL 4 MG/2ML IJ SOLN
INTRAMUSCULAR | Status: DC | PRN
  Administered 2019-09-23: 4 mg via INTRAVENOUS

## 2019-09-23 MED ORDER — GLYCOPYRROLATE 0.2 MG/ML IJ SOLN
INTRAMUSCULAR | Status: DC | PRN
  Administered 2019-09-23: 0.1 mg via INTRAVENOUS

## 2019-09-23 SURGICAL SUPPLY — 56 items
ADAPTER IRRIG TUBE 2 SPIKE SOL (ADAPTER) ×4 IMPLANT
ANCHOR ICONIX SPEED 2.0 TAPE (Anchor) ×1 IMPLANT
ANCHOR SUT BIOCOMP LK 2.9X12.5 (Anchor) ×1 IMPLANT
BUR BR 5.5 12 FLUTE (BURR) ×2 IMPLANT
BUR RADIUS 4.0X18.5 (BURR) ×2 IMPLANT
CANNULA PART THRD DISP 5.75X7 (CANNULA) ×2 IMPLANT
CANNULA PARTIAL THREAD 2X7 (CANNULA) ×1 IMPLANT
CHLORAPREP W/TINT 26 (MISCELLANEOUS) ×2 IMPLANT
COOLER POLAR GLACIER W/PUMP (MISCELLANEOUS) ×2 IMPLANT
COVER LIGHT HANDLE UNIVERSAL (MISCELLANEOUS) ×4 IMPLANT
COVER WAND RF STERILE (DRAPES) ×1 IMPLANT
DERMABOND ADVANCED (GAUZE/BANDAGES/DRESSINGS) ×1
DERMABOND ADVANCED .7 DNX12 (GAUZE/BANDAGES/DRESSINGS) ×1 IMPLANT
DRAPE IMP U-DRAPE 54X76 (DRAPES) ×4 IMPLANT
DRAPE INCISE IOBAN 66X45 STRL (DRAPES) ×2 IMPLANT
DRAPE SHEET LG 3/4 BI-LAMINATE (DRAPES) ×2 IMPLANT
DRAPE U-SHAPE 48X52 POLY STRL (PACKS) ×4 IMPLANT
DRSG TEGADERM 4X4.75 (GAUZE/BANDAGES/DRESSINGS) ×3 IMPLANT
ELECT REM PT RETURN 9FT ADLT (ELECTROSURGICAL) ×2
ELECTRODE REM PT RTRN 9FT ADLT (ELECTROSURGICAL) ×1 IMPLANT
GAUZE SPONGE 4X4 12PLY STRL (GAUZE/BANDAGES/DRESSINGS) ×2 IMPLANT
GAUZE XEROFORM 1X8 LF (GAUZE/BANDAGES/DRESSINGS) ×2 IMPLANT
GLOVE BIOGEL PI IND STRL 8 (GLOVE) ×1 IMPLANT
GLOVE BIOGEL PI INDICATOR 8 (GLOVE) ×1
GOWN STRL REIN 2XL XLG LVL4 (GOWN DISPOSABLE) ×2 IMPLANT
GOWN STRL REUS W/ TWL LRG LVL3 (GOWN DISPOSABLE) ×1 IMPLANT
GOWN STRL REUS W/TWL LRG LVL3 (GOWN DISPOSABLE) ×1
IV LACTATED RINGER IRRG 3000ML (IV SOLUTION) ×19
IV LR IRRIG 3000ML ARTHROMATIC (IV SOLUTION) ×8 IMPLANT
KIT INSERTION 2.9 PUSHLOCK (KITS) ×1 IMPLANT
KIT STABILIZATION SHOULDER (MISCELLANEOUS) ×2 IMPLANT
KIT TURNOVER KIT A (KITS) ×2 IMPLANT
MANIFOLD 4PT FOR NEPTUNE1 (MISCELLANEOUS) ×2 IMPLANT
MASK FACE SPIDER DISP (MASK) ×2 IMPLANT
MAT ABSORB  FLUID 56X50 GRAY (MISCELLANEOUS) ×2
MAT ABSORB FLUID 56X50 GRAY (MISCELLANEOUS) ×2 IMPLANT
NDL SAFETY ECLIPSE 18X1.5 (NEEDLE) ×1 IMPLANT
NDL SCORPION MULTI FIRE (NEEDLE) IMPLANT
NEEDLE HYPO 18GX1.5 SHARP (NEEDLE) ×1
NEEDLE SCORPION MULTI FIRE (NEEDLE) ×2 IMPLANT
PACK ARTHROSCOPY SHOULDER (MISCELLANEOUS) ×2 IMPLANT
PAD WRAPON POLAR SHDR UNIV (MISCELLANEOUS) ×1 IMPLANT
PENCIL SMOKE EVACUATOR (MISCELLANEOUS) ×2 IMPLANT
SET TUBE SUCT SHAVER OUTFL 24K (TUBING) ×2 IMPLANT
SET TUBE TIP INTRA-ARTICULAR (MISCELLANEOUS) ×2 IMPLANT
SUT ETHILON 3-0 FS-10 30 BLK (SUTURE) ×2
SUT PROLENE 2 0 CT2 30 (SUTURE) ×1 IMPLANT
SUTURE EHLN 3-0 FS-10 30 BLK (SUTURE) IMPLANT
SUTURE TAPE FIBERLINK 1.3 LOOP (SUTURE) IMPLANT
SUTURETAPE FIBERLINK 1.3 LOOP (SUTURE) ×2
SYR 10ML LL (SYRINGE) ×2 IMPLANT
TAPE MICROFOAM 4IN (TAPE) ×2 IMPLANT
TUBING ARTHRO INFLOW-ONLY STRL (TUBING) ×2 IMPLANT
TUBING CONNECTING 10 (TUBING) ×2 IMPLANT
WAND WEREWOLF FLOW 90D (MISCELLANEOUS) ×2 IMPLANT
WRAPON POLAR PAD SHDR UNIV (MISCELLANEOUS) ×2

## 2019-09-23 NOTE — Anesthesia Procedure Notes (Signed)
Anesthesia Regional Block: Interscalene brachial plexus block   Pre-Anesthetic Checklist: ,, timeout performed, Correct Patient, Correct Site, Correct Laterality, Correct Procedure, Correct Position, site marked, Risks and benefits discussed,  Surgical consent,  Pre-op evaluation,  At surgeon's request and post-op pain management  Laterality: Left  Prep: chloraprep       Needles:  Injection technique: Single-shot  Needle Type: Stimiplex     Needle Length: 10cm  Needle Gauge: 21     Additional Needles:   Procedures:,,,, ultrasound used (permanent image in chart),,,,  Narrative:  Start time: 09/23/2019 12:16 PM End time: 09/23/2019 12:25 PM Injection made incrementally with aspirations every 5 mL.  Performed by: Personally  Anesthesiologist: Darrin Nipper, MD  Additional Notes: Functioning IV was confirmed and monitors applied. Ultrasound guidance: relevant anatomy identified, needle position confirmed, local anesthetic spread visualized around nerve(s), vascular puncture avoided.  Image printed for medical record.  Negative aspiration and no paresthesias; incremental administration of local anesthetic for a total of Exparel 53ml and bupivacaine 0.25% 49ml in interscalene distribution, with extra 18ml bupivacaine 0.25% given in superficial cervical plexus distribution based on patient complaints of pain in that area. The patient tolerated the procedure well. Vital signs recorded in RN notes.

## 2019-09-23 NOTE — Discharge Instructions (Signed)
Post-Op Instructions - Rotator Cuff Repair  1. Bracing: You will wear a shoulder immobilizer or sling for 6 weeks.   2. Driving: No driving for 3 weeks post-op. When driving, do not wear the immobilizer. Ideally, we recommend no driving for 6 weeks while sling is in place as one arm will be immobilized.   3. Activity: No active lifting for 2 months. Wrist, hand, and elbow motion only. Avoid lifting the upper arm away from the body except for hygiene. You are permitted to bend and straighten the elbow passively only (no active elbow motion). You may use your hand and wrist for typing, writing, and managing utensils (cutting food). Do not lift more than a coffee cup for 8 weeks.  When sleeping or resting, inclined positions (recliner chair or wedge pillow) and a pillow under the forearm for support may provide better comfort for up to 4 weeks.  Avoid long distance travel for 4 weeks.  Return to normal activities after rotator cuff repair repair normally takes 6 months on average. If rehab goes very well, may be able to do most activities at 4 months, except overhead or contact sports.  4. Physical Therapy: Begins 3-4 days after surgery, and proceed 1 time per week for the first 6 weeks, then 1-2 times per week from weeks 6-20 post-op.  5. Medications:  - You will be provided a prescription for narcotic pain medicine. After surgery, take 1-2 narcotic tablets every 4 hours if needed for severe pain.  - A prescription for anti-nausea medication will be provided in case the narcotic medicine causes nausea - take 1 tablet every 6 hours only if nauseated.   - Take tylenol 1000 mg (2 Extra Strength tablets or 3 regular strength) every 8 hours for pain.  May decrease or stop tylenol 5 days after surgery if you are having minimal pain. - Take ASA 325mg /day x 2 weeks to help prevent DVTs/PEs (blood clots).  - DO NOT take ANY nonsteroidal anti-inflammatory pain medications (Advil, Motrin, Ibuprofen, Aleve,  Naproxen, or Naprosyn). These medicines can inhibit healing of your shoulder repair.    If you are taking prescription medication for anxiety, depression, insomnia, muscle spasm, chronic pain, or for attention deficit disorder, you are advised that you are at a higher risk of adverse effects with use of narcotics post-op, including narcotic addiction/dependence, depressed breathing, death. If you use non-prescribed substances: alcohol, marijuana, cocaine, heroin, methamphetamines, etc., you are at a higher risk of adverse effects with use of narcotics post-op, including narcotic addiction/dependence, depressed breathing, death. You are advised that taking > 50 morphine milligram equivalents (MME) of narcotic pain medication per day results in twice the risk of overdose or death. For your prescription provided: oxycodone 5 mg - taking more than 6 tablets per day would result in > 50 morphine milligram equivalents (MME) of narcotic pain medication. Be advised that we will prescribe narcotics short-term, for acute post-operative pain only - 3 weeks for major operations such as shoulder repair/reconstruction surgeries.    6. Post-Op Appointment:  Your first post-op appointment will be 10-14 days post-op.  7. Work or School: For most, but not all procedures, we advise staying out of work or school for at least 1 to 2 weeks in order to recover from the stress of surgery and to allow time for healing.   If you need a work or school note this can be provided.   8. Smoking: If you are a smoker, you need to refrain from smoking  in the postoperative period. The nicotine in cigarettes will inhibit healing of your shoulder repair and decrease the chance of successful repair. Similarly, nicotine containing products (gum, patches) should be avoided.   Post-operative Brace: Apply and remove the brace you received as you were instructed to at the time of fitting and as described in detail as the braces  instructions for use indicate.  Wear the brace for the period of time prescribed by your physician.  The brace can be cleaned with soap and water and allowed to air dry only.  Should the brace result in increased pain, decreased feeling (numbness/tingling), increased swelling or an overall worsening of your medical condition, please contact your doctor immediately.  If an emergency situation occurs as a result of wearing the brace after normal business hours, please dial 911 and seek immediate medical attention.  Let your doctor know if you have any further questions about the brace issued to you. Refer to the shoulder sling instructions for use if you have any questions regarding the correct fit of your shoulder sling.  [facility]ooting: [phone]  Video that illustrates how to properly use a shoulder sling: "Instructions for Proper Use of an Orthopaedic Sling" [email or web address]    Information for Discharge Teaching: EXPAREL (bupivacaine liposome injectable suspension)   Your surgeon or anesthesiologist gave you EXPAREL(bupivacaine) to help control your pain after surgery.   EXPAREL is a local anesthetic that provides pain relief by numbing the tissue around the surgical site.  EXPAREL is designed to release pain medication over time and can control pain for up to 72 hours.  Depending on how you respond to EXPAREL, you may require less pain medication during your recovery.  Possible side effects:  Temporary loss of sensation or ability to move in the area where bupivacaine was injected.  Nausea, vomiting, constipation  Rarely, numbness and tingling in your mouth or lips, lightheadedness, or anxiety may occur.  Call your doctor right away if you think you may be experiencing any of these sensations, or if you have other questions regarding possible side effects.  Follow all other discharge instructions given to you by your surgeon or  nurse. Eat a healthy diet and drink plenty of water or other fluids.  If you return to the hospital for any reason within 96 hours following the administration of EXPAREL, it is important for health care providers to know that you have received this anesthetic. A teal colored band has been placed on your arm with the date, time and amount of EXPAREL you have received in order to alert and inform your health care providers. Please leave this armband in place for the full 96 hours following administration, and then you may remove the band.   General Anesthesia, Adult, Care After This sheet gives you information about how to care for yourself after your procedure. Your health care provider may also give you more specific instructions. If you have problems or questions, contact your health care provider. What can I expect after the procedure? After the procedure, the following side effects are common:  Pain or discomfort at the IV site.  Nausea.  Vomiting.  Sore throat.  Trouble concentrating.  Feeling cold or chills.  Weak or tired.  Sleepiness and fatigue.  Soreness and body aches. These side effects can affect parts of the body that were not involved in surgery. Follow these instructions at home:  For at least 24 hours after the procedure:  Have a responsible adult stay  with you. It is important to have someone help care for you until you are awake and alert.  Rest as needed.  Do not: ? Participate in activities in which you could fall or become injured. ? Drive. ? Use heavy machinery. ? Drink alcohol. ? Take sleeping pills or medicines that cause drowsiness. ? Make important decisions or sign legal documents. ? Take care of children on your own. Eating and drinking  Follow any instructions from your health care provider about eating or drinking restrictions.  When you feel hungry, start by eating small amounts of foods that are soft and easy to digest (bland), such as  toast. Gradually return to your regular diet.  Drink enough fluid to keep your urine pale yellow.  If you vomit, rehydrate by drinking water, juice, or clear broth. General instructions  If you have sleep apnea, surgery and certain medicines can increase your risk for breathing problems. Follow instructions from your health care provider about wearing your sleep device: ? Anytime you are sleeping, including during daytime naps. ? While taking prescription pain medicines, sleeping medicines, or medicines that make you drowsy.  Return to your normal activities as told by your health care provider. Ask your health care provider what activities are safe for you.  Take over-the-counter and prescription medicines only as told by your health care provider.  If you smoke, do not smoke without supervision.  Keep all follow-up visits as told by your health care provider. This is important. Contact a health care provider if:  You have nausea or vomiting that does not get better with medicine.  You cannot eat or drink without vomiting.  You have pain that does not get better with medicine.  You are unable to pass urine.  You develop a skin rash.  You have a fever.  You have redness around your IV site that gets worse. Get help right away if:  You have difficulty breathing.  You have chest pain.  You have blood in your urine or stool, or you vomit blood. Summary  After the procedure, it is common to have a sore throat or nausea. It is also common to feel tired.  Have a responsible adult stay with you for the first 24 hours after general anesthesia. It is important to have someone help care for you until you are awake and alert.  When you feel hungry, start by eating small amounts of foods that are soft and easy to digest (bland), such as toast. Gradually return to your regular diet.  Drink enough fluid to keep your urine pale yellow.  Return to your normal activities as told by  your health care provider. Ask your health care provider what activities are safe for you. This information is not intended to replace advice given to you by your health care provider. Make sure you discuss any questions you have with your health care provider. Document Released: 01/29/2001 Document Revised: 10/26/2017 Document Reviewed: 06/08/2017 Elsevier Patient Education  2020 [employer].

## 2019-09-23 NOTE — Anesthesia Procedure Notes (Signed)
Procedure Name: LMA Insertion Date/Time: 09/23/2019 1:14 PM Performed by: Cameron Ali, CRNA Pre-anesthesia Checklist: Patient identified, Emergency Drugs available, Suction available, Timeout performed and Patient being monitored Patient Re-evaluated:Patient Re-evaluated prior to induction Oxygen Delivery Method: Circle system utilized Preoxygenation: Pre-oxygenation with 100% oxygen Induction Type: IV induction LMA: LMA inserted LMA Size: 3.0 Number of attempts: 1 Placement Confirmation: positive ETCO2 and breath sounds checked- equal and bilateral Tube secured with: Tape Dental Injury: Teeth and Oropharynx as per pre-operative assessment

## 2019-09-23 NOTE — H&P (Signed)
Paper H&P to be scanned into permanent record. H&P reviewed. No significant changes noted.  

## 2019-09-23 NOTE — Progress Notes (Signed)
Assisted Darrin Nipper, ANMD with left, ultrasound guided, interscalene  block. Side rails up, monitors on throughout procedure. See vital signs in flow sheet. Tolerated Procedure well.
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
## 2019-09-23 NOTE — Anesthesia Postprocedure Evaluation (Signed)
Anesthesia Post Note  Patient: Nicole Walter  Procedure(s) Performed: SHOULDER ARTHROSCOPY WITH MINI ROTATOR CUFF REPAIR AND SUBACROMIAL DECOMPRESSION, BICEPS TENODESIS (Left Shoulder)     Patient location during evaluation: PACU Anesthesia Type: Regional Level of consciousness: awake and alert, oriented and patient cooperative Pain management: pain level controlled Vital Signs Assessment: post-procedure vital signs reviewed and stable Respiratory status: spontaneous breathing, nonlabored ventilation and respiratory function stable Cardiovascular status: blood pressure returned to baseline and stable Postop Assessment: adequate PO intake Anesthetic complications: no    Darrin Nipper

## 2019-09-23 NOTE — Transfer of Care (Signed)
Immediate Anesthesia Transfer of Care Note  Patient: Nicole Walter  Procedure(s) Performed: SHOULDER ARTHROSCOPY WITH MINI ROTATOR CUFF REPAIR AND SUBACROMIAL DECOMPRESSION, BICEPS TENODESIS (Left )  Patient Location: PACU  Anesthesia Type: General, Regional  Level of Consciousness: awake, alert  and patient cooperative  Airway and Oxygen Therapy: Patient Spontanous Breathing and Patient connected to supplemental oxygen  Post-op Assessment: Post-op Vital signs reviewed, Patient's Cardiovascular Status Stable, Respiratory Function Stable, Patent Airway and No signs of Nausea or vomiting  Post-op Vital Signs: Reviewed and stable  Complications: No apparent anesthesia complications

## 2019-09-24 ENCOUNTER — Encounter: Payer: Self-pay | Admitting: Orthopedic Surgery

## 2019-09-25 ENCOUNTER — Encounter: Payer: Self-pay | Admitting: Pain Medicine

## 2019-09-28 NOTE — Progress Notes (Signed)
Pain Management Virtual Encounter Note - Virtual Visit via Telephone Telehealth (real-time audio visits between healthcare provider and patient).   Patient's Phone No. & Preferred Pharmacy:  [phone] (home); [phone] (mobile); (Preferred) [phone] [email or web address]  [facility] [id number] Nicole Walter, [address] [place] [address] Phone: [phone] Fax: [place], [place] [address] [place] [address] [place] [address] Phone: [phone] Fax: [phone]    Pre-screening note:  Our staff contacted Nicole Walter and offered her an "in person", "face-to-face" appointment versus a telephone encounter. She indicated preferring the telephone encounter, at this time.   Reason for Virtual Visit: COVID-19*  Social distancing based on CDC and AMA recommendations.   I contacted Nicole Walter on 09/29/2019 via telephone.      I clearly identified myself as Gaspar Cola, MD. I verified that I was speaking with the correct person using two identifiers (Name: Nicole Walter, and date of birth: 12/08/1960).  Advanced Informed Consent I sought verbal advanced consent from Nicole Walter for virtual visit interactions. I informed Nicole Walter of possible security and privacy concerns, risks, and limitations associated with providing "not-in-person" medical evaluation and management services. I also informed Nicole Walter of the availability of "in-person" appointments. Finally, I informed her that there would be a charge for the virtual visit and that she could be  personally, fully or partially, financially responsible for it. Nicole Walter expressed understanding and agreed to proceed.   Historic Elements   Nicole Walter is a 58 y.o. year old, female patient evaluated today after her last encounter by our  practice on 09/22/2019. Nicole Walter  has a past medical history of Abnormal liver enzymes (02/08/2016), Allergy, Anxiety, Chest pain (07/05/2016), Chronic pain, Depression, DJD (degenerative joint disease), Fibromyalgia, High cholesterol, Rash of back (01/28/2016), and Weight loss due to medication. She also  has a past surgical history that includes Cholecystectomy; Tubal ligation; Ankle surgery; Nasal sinus surgery; Tonsillectomy; Colonoscopy; Esophagogastroduodenoscopy (egd) with propofol (N/A, 02/22/2016); Colonoscopy with propofol (N/A, 02/22/2016); Breast biopsy (Left, 1997); and Shoulder arthroscopy with rotator cuff repair and subacromial decompression (Left, 09/23/2019). Nicole Walter has a current medication list which includes the following prescription(s): acetaminophen, aspirin ec, cholecalciferol, dicyclomine, fexofenadine, fluocinonide cream, fluticasone, linzess, lorazepam, multi-vitamins, narcan, neomycin-polymyxin-hydrocortisone, ondansetron, oxycodone, oxycodone, oxycodone, rabeprazole, sucralfate, tizanidine, trintellix, saxenda, and triamcinolone cream. She  reports that she has been smoking e-cigarettes. She started smoking about 45 years ago. She has a 35.00 pack-year smoking history. She has never used smokeless tobacco. She reports that she does not drink alcohol or use drugs. Nicole Walter is allergic to other; adhesive [tape]; food; and pea.   HPI  Today, she is being contacted for medication management.  The patient indicates doing well with the current medication regimen. No adverse reactions or side effects reported to the medications.  The patient indicates currently having more pain than usual, primarily due to the sling that she has to wear after the surgery.  She had surgery on 09/23/2019, by Dr. Posey Pronto.  According to the postop note she had:  1. Left arthroscopic rotator cuff repair 2. Left arthroscopic biceps tenodesis 3. Left subacromial decompression 4. Left extensive debridement of  shoulder (glenohumeral and subacromial spaces)  The patient asked me today about pain medication after her surgery and I reminded her that our policy is  that we only treat "chronic pain".  I reminded her that all postop pain should be managed by the surgeons.  Today I will be sending a note to Dr. Cato Mulligan with some recommendations.  Unfortunately, this patient is a little finicky when he comes to pain medications.  She is unable to tolerate anything that has Tylenol and she is also unable to tolerate the tramadol since it makes her "crazy".  Apparently she has taken and tolerated it well morphine and Dilaudid, in addition to the oxycodone.  However, because she is currently on oxycodone, it is my experience that sometimes the insurance companies interpret additional oxycodone prescriptions as the patient getting her medicines early and sometimes he gets it denied.  An alternative to treat her would be perhaps to provide her with some short-term Dilaudid 2 mg every 8 hour PRN prescriptions.  Today the patient asked me about coming in to have some injections done in the upper back to treat some of the neck pain that she has been experiencing.  I reminded her that because the steroids will decrease healing, we normally would prefer that she fully recover from her surgery before planning on having any steroid injections.  I have recommended that she first get clearance from the surgeon for planning on having any type of injections done with [place].  Pharmacotherapy Assessment  Analgesic: Oxycodone IR 5 mg q 6 hrs (20mg /dayof oxycodone) MME/day:30mg /day.   Monitoring: Pharmacotherapy: No side-effects or adverse reactions reported. [place] PMP: PDMP reviewed during this encounter.       Compliance: No problems identified. Effectiveness: Clinically acceptable. Plan: Refer to "POC".  UDS:  Summary  Date Value Ref Range Status  08/19/2018 FINAL  Final    Comment:     ==================================================================== TOXASSURE SELECT 13 (MW) ==================================================================== Test                             Result       Flag       Units Drug Present and Declared for Prescription Verification   Lorazepam                      1903         EXPECTED   ng/mg creat    Source of lorazepam is a scheduled prescription medication.   Oxycodone                      127          EXPECTED   ng/mg creat   Oxymorphone                    395          EXPECTED   ng/mg creat   Noroxycodone                   1203         EXPECTED   ng/mg creat   Noroxymorphone                 265          EXPECTED   ng/mg creat    Sources of oxycodone are scheduled prescription medications.    Oxymorphone, noroxycodone, and noroxymorphone are expected    metabolites of oxycodone. Oxymorphone is also available as a    scheduled prescription medication. ==================================================================== Test  Result    Flag   Units      Ref Range   Creatinine              98               mg/dL      >=20 ==================================================================== Declared Medications:  The flagging and interpretation on this report are based on the  following declared medications.  Unexpected results may arise from  inaccuracies in the declared medications.  **Note: The testing scope of this panel includes these medications:  Lorazepam (Ativan)  Oxycodone (Roxicodone)  **Note: The testing scope of this panel does not include following  reported medications:  Fluticasone (Flonase)  Gabapentin  Ibuprofen  Linaclotide (Linzess)  Multivitamin  Naloxone (Narcan)  Omega-3 Fatty Acids (Fish Oil)  Rabeprazole (Aciphex)  Sucralfate (Carafate)  Topical (Lidex)  Vortioxetine (Trintellix) ==================================================================== For clinical consultation, please  call [phone]. ====================================================================    Laboratory Chemistry Profile (12 mo)  Renal: 12/07/2018: BUN 10; Creatinine, Ser 0.70  Lab Results  Component Value Date   GFR 86.13 10/08/2018   GFRAA >60 12/07/2018   GFRNONAA >60 12/07/2018   Hepatic: 12/07/2018: Albumin 4.6 Lab Results  Component Value Date   AST 35 12/07/2018   ALT 42 12/07/2018   Other: 10/11/2018: VITD 51.75 Note: Above Lab results reviewed.  Imaging  Last 90 days:  Mr Shoulder Left Wo Contrast  Result Date: 08/27/2019 CLINICAL DATA:  Left shoulder pain, limited range of motion EXAM: MRI OF THE LEFT SHOULDER WITHOUT CONTRAST TECHNIQUE: Multiplanar, multisequence MR imaging of the shoulder was performed. No intravenous contrast was administered. COMPARISON:  None. FINDINGS: Rotator cuff: Severe tendinosis of the supraspinatus tendon with a high-grade partial-thickness bursal surface tear anteriorly with possible small full-thickness component. Moderate tendinosis of the infraspinatus tendon. Teres minor tendon is intact. Subscapularis tendon is intact. Muscles: No atrophy or fatty replacement of nor abnormal signal within, the muscles of the rotator cuff. Biceps long head: Mild tendinosis of the intra-articular portion of the long head of the biceps tendon. Acromioclavicular Joint: Moderate arthropathy of the acromioclavicular joint. Type II acromion. Small amount of subacromial/subdeltoid bursal fluid. Glenohumeral Joint: No joint effusion.  No chondral defect. Labrum: Grossly intact, but evaluation is limited by lack of intraarticular fluid. Bones:  No acute osseous abnormality.  No aggressive osseous lesion. Other: No fluid collection or hematoma. IMPRESSION: 1. Severe tendinosis of the supraspinatus tendon with a high-grade partial-thickness bursal surface tear anteriorly with possible small full-thickness component. 2. Moderate tendinosis of the infraspinatus tendon. 3. Mild  tendinosis of the intra-articular portion of the long head of the biceps tendon. Electronically Signed   By: Kathreen Devoid   On: 08/27/2019 13:08    Assessment  The primary encounter diagnosis was Chronic pain syndrome. Diagnoses of Chronic shoulder pain (Left), Chronic neck pain (Primary Area of Pain) (Bilateral) (L>R), Chronic upper back pain (Secondary Area of Pain) (Bilateral) (L>R), Chronic shoulder pain (Tertiary Area of Pain) (Bilateral) (L>R), Muscle spasm, and Myofascial pain syndrome, cervical (trapezius) (Left) were also pertinent to this visit.  Plan of Care  I have discontinued Nicole Walter's Belsomra. I am also having her start on oxyCODONE and oxyCODONE. Additionally, I am having her maintain her sucralfate, Multi-Vitamins, RABEprazole, fluocinonide cream, dicyclomine, cholecalciferol, LORazepam, fluticasone, fexofenadine, Trintellix, Linzess, Narcan, acetaminophen, aspirin EC, ondansetron, Saxenda, NEOMYCIN-POLYMYXIN-HYDROCORTISONE, triamcinolone cream, oxyCODONE, and tiZANidine.  Pharmacotherapy (Medications Ordered): Meds ordered this encounter  Medications  . oxyCODONE (OXY IR/ROXICODONE) 5 MG immediate release tablet  Sig: Take 1 tablet (5 mg total) by mouth every 6 (six) hours as needed for severe pain. Must last 30 days    Dispense:  120 tablet    Refill:  0    Chronic Pain: STOP Act (Not applicable) Fill 1 day early if closed on refill date. Do not fill until: 09/30/2019. To last until: 10/30/2019. Avoid benzodiazepines within 8 hours of opioids  . tiZANidine (ZANAFLEX) 4 MG tablet    Sig: Take 1 tablet (4 mg total) by mouth 2 (two) times daily as needed for muscle spasms.    Dispense:  60 tablet    Refill:  5    Fill one day early if pharmacy is closed on scheduled refill date. May substitute for generic if available.  Marland Kitchen oxyCODONE (OXY IR/ROXICODONE) 5 MG immediate release tablet    Sig: Take 1 tablet (5 mg total) by mouth every 6 (six) hours as needed for severe  pain. Must last 30 days    Dispense:  120 tablet    Refill:  0    Chronic Pain: STOP Act (Not applicable) Fill 1 day early if closed on refill date. Do not fill until: 10/30/2019. To last until: 11/29/2019. Avoid benzodiazepines within 8 hours of opioids  . oxyCODONE (OXY IR/ROXICODONE) 5 MG immediate release tablet    Sig: Take 1 tablet (5 mg total) by mouth every 6 (six) hours as needed for severe pain. Must last 30 days    Dispense:  120 tablet    Refill:  0    Chronic Pain: STOP Act (Not applicable) Fill 1 day early if closed on refill date. Do not fill until: 11/29/2019. To last until: 12/29/2019. Avoid benzodiazepines within 8 hours of opioids   Orders:  No orders of the defined types were placed in this encounter.  Follow-up plan:   Return in about 13 weeks (around 12/29/2019) for (VV), (MM), in addition, PRN Procedure(s).      Interventional treatment options: Under consideration:   Possible right cervical facet RFA   Diagnostic right IA shoulder joint injection   Diagnostic left suprascapular NB   Possible bilateral suprascapular nerve RFA   Diagnostic bilateral lumbar facet block  Possible bilateral lumbar facet RFA  Diagnostic bilateral femoral and obturator NB Possible bilateral hip joint RFA  Possible right genicular NB  Possible right genicular nerve RFA  Possible left LESI   Therapeutic/palliative (PRN):   Palliative left CESI #3  Palliativebilateral suprascapular NB Palliative bilateral cervical facet block #4  Palliative left cervical facet RFA #2 (last done on 03/28/2018)  Palliative right thoracic ESI #3  Diagnostic left IA shoulder joint injection #2  Diagnostic right suprascapular nerve block #2  Diagnosticright IA knee joint injection #2 Therapeutic right Hyalgan knee joint injections series #2.  (Last completed on 05/16/2017)  Palliative right IA hip joint injection #3 Diagnostic left IA hip joint injection #2     Recent Visits Date Type Provider  Dept  07/22/19 Office Visit Milinda Pointer, MD [facility]  Showing recent visits within past 90 days and meeting all other requirements   Today's Visits Date Type Provider Dept  09/29/19 Telemedicine Milinda Pointer, MD [facility]  Showing today's visits and meeting all other requirements   Future Appointments No visits were found meeting these conditions.  Showing future appointments within next 90 days and meeting all other requirements   I discussed the assessment and treatment plan with the patient. The patient was provided an opportunity to ask questions  and all were answered. The patient agreed with the plan and demonstrated an understanding of the instructions.  Patient advised to call back or seek an in-person evaluation if the symptoms or condition worsens.  Total duration of non-face-to-face encounter: 15 minutes.  Note by: Gaspar Cola, MD Date: 09/29/2019; Time: 9:35 AM  Note: This dictation was prepared with Dragon dictation. Any transcriptional errors that may result from this process are unintentional.  Disclaimer:  * Given the special circumstances of the COVID-19 pandemic, the federal government has announced that the Office for Civil Rights (OCR) will exercise its enforcement discretion and will not impose penalties on physicians using telehealth in the event of noncompliance with regulatory requirements under the [place] (HIPAA) in connection with the good faith provision of telehealth during the XX123456 national public health emergency. ([place])

## 2019-09-29 ENCOUNTER — Other Ambulatory Visit: Payer: Self-pay

## 2019-09-29 ENCOUNTER — Ambulatory Visit: Payer: BLUE CROSS/BLUE SHIELD | Attending: Pain Medicine | Admitting: Pain Medicine

## 2019-09-29 DIAGNOSIS — M25511 Pain in right shoulder: Secondary | ICD-10-CM

## 2019-09-29 DIAGNOSIS — M549 Dorsalgia, unspecified: Secondary | ICD-10-CM | POA: Diagnosis not present

## 2019-09-29 DIAGNOSIS — M542 Cervicalgia: Secondary | ICD-10-CM | POA: Diagnosis not present

## 2019-09-29 DIAGNOSIS — G8929 Other chronic pain: Secondary | ICD-10-CM

## 2019-09-29 DIAGNOSIS — M62838 Other muscle spasm: Secondary | ICD-10-CM

## 2019-09-29 DIAGNOSIS — M7918 Myalgia, other site: Secondary | ICD-10-CM

## 2019-09-29 DIAGNOSIS — G894 Chronic pain syndrome: Secondary | ICD-10-CM

## 2019-09-29 DIAGNOSIS — M25512 Pain in left shoulder: Secondary | ICD-10-CM | POA: Diagnosis not present

## 2019-09-29 MED ORDER — OXYCODONE HCL 5 MG PO TABS: 5.0000 mg | ORAL_TABLET | Freq: Four times a day (QID) | ORAL | 0 refills | Status: DC | PRN

## 2019-09-29 MED ORDER — TIZANIDINE HCL 4 MG PO TABS: 4.0000 mg | ORAL_TABLET | Freq: Two times a day (BID) | ORAL | 5 refills | Status: DC | PRN

## 2019-10-09 ENCOUNTER — Other Ambulatory Visit: Payer: Self-pay | Admitting: Pain Medicine

## 2019-10-09 ENCOUNTER — Telehealth: Payer: Self-pay | Admitting: Pain Medicine

## 2019-10-09 NOTE — Telephone Encounter (Signed)
As you well know, we do not and will not be treating any acute postoperative pain.  This is our policy and I am not about to depart from it.  If she is experiencing that much pain, then she needs to see him, but not to ask for more pain medicine, but to see if there is something wrong with the surgery.  Is she having an infection, is there any other type of complication with the surgery.  I will not be taking over the management of this acute pain.  I already looked at the patient's PMP and she has received 2 different prescriptions from the surgeon.  If this is what he normally gives to all of his patients, then it should be sufficient.  The way that this works is that he should treat her like any other patient, but completely this regarding the fact that she is taking some pain medication from [place].  If she is still having quite a bit of pain, she could ask if he can prescribe a steroid taper to help with some of this pain.  The only other thing that we can do is to offer her a Toradol/Norflex 60/60 IM injection to help break the cycle of pain.  If she wants to take advantage of this, then schedule her as a nurse visit.

## 2019-10-09 NOTE — Telephone Encounter (Signed)
Understood 

## 2019-10-09 NOTE — Telephone Encounter (Signed)
Patient states that her pain medication is not helping. She had shoulder surgery on Sep 23, 2019 by Dr Posey Pronto. She is currently taking Oxycodone 5mg  1 tab QID. Dr Posey Pronto is also prescribing her Oxy 5mg  1-2 tabs Q4 as needed.  She talked with Dr Posey Pronto today and states that he can not write her for anything stronger that she needed to ask Dr Dossie Arbour. She stated that yall discussed this on her VV.  Mrs Waltrip also went to the Physical Therapist and they told her that they could do therapy until her pain is under control. She would like a prescription for Dilaudid.

## 2019-10-09 NOTE — Telephone Encounter (Signed)
Patient lvmail today at 12:10 stating she wants to talk with someone about a medicine. Please call patient. She did not leave any further info.

## 2019-10-09 NOTE — Telephone Encounter (Signed)
Talked with patient about what Dr Consuela Mimes stated. I instructed her to get back in touch with her surgoen. She said brace was hurting neck ect. I told her that maybe she needs another brace of PT to adjust it correctly.  I offered her a Toradol/Norflex injection and she said she didn't want anything that last just a few days.  She will contact her  Surgeon.

## 2019-10-14 ENCOUNTER — Telehealth: Payer: Self-pay | Admitting: Pain Medicine

## 2019-10-14 NOTE — Telephone Encounter (Signed)
Spoke with Ronalee Belts at [facility], informed him it is ok to fill script.

## 2019-10-14 NOTE — Telephone Encounter (Signed)
Ronalee Belts at [facility] called stating patient has a pain med script from Dr. Posey Pronto for surgery. They need to know if it is ok to fill this script for patient as she comes here for pain meds.

## 2019-10-24 ENCOUNTER — Encounter: Payer: Self-pay | Admitting: Obstetrics and Gynecology

## 2019-11-24 ENCOUNTER — Telehealth: Payer: Self-pay | Admitting: *Deleted

## 2019-11-24 DIAGNOSIS — Z87891 Personal history of nicotine dependence: Secondary | ICD-10-CM

## 2019-11-24 NOTE — Telephone Encounter (Signed)
Patient has been notified that annual lung cancer screening low dose CT scan is due currently or will be in near future. Confirmed that patient is within the age range of 55-77, and asymptomatic, (no signs or symptoms of lung cancer). Patient denies illness that would prevent curative treatment for lung cancer if found. Verified smoking history, (former, quit 07/2014, 35 pack year). The shared decision making visit was done 05/01/17. Patient is agreeable for CT scan being scheduled.

## 2019-11-27 ENCOUNTER — Telehealth: Payer: Self-pay

## 2019-11-27 NOTE — Telephone Encounter (Signed)
Due to her insurance not paying here, she would like a referral to [facility]. I will place the referral if that's ok with you.

## 2019-12-01 ENCOUNTER — Ambulatory Visit
Admission: RE | Admit: 2019-12-01 | Discharge: 2019-12-01 | Disposition: A | Discharge status: Home / Self Care | Payer: BLUE CROSS/BLUE SHIELD | Source: Ambulatory Visit | Attending: Oncology | Admitting: Oncology

## 2019-12-01 ENCOUNTER — Other Ambulatory Visit: Payer: Self-pay

## 2019-12-01 DIAGNOSIS — Z87891 Personal history of nicotine dependence: Secondary | ICD-10-CM | POA: Insufficient documentation

## 2019-12-03 ENCOUNTER — Encounter: Payer: Self-pay | Admitting: *Deleted

## 2019-12-24 ENCOUNTER — Telehealth: Payer: Self-pay | Admitting: *Deleted

## 2019-12-29 ENCOUNTER — Telehealth: Payer: BLUE CROSS/BLUE SHIELD | Admitting: Pain Medicine

## 2019-12-31 NOTE — Telephone Encounter (Signed)
Called to speak to patient re; New patient appt. Information gone over and entered.

## 2020-01-21 ENCOUNTER — Other Ambulatory Visit: Payer: Self-pay | Admitting: Student

## 2020-01-21 DIAGNOSIS — M5412 Radiculopathy, cervical region: Secondary | ICD-10-CM

## 2020-01-21 DIAGNOSIS — M5414 Radiculopathy, thoracic region: Secondary | ICD-10-CM

## 2020-02-01 ENCOUNTER — Ambulatory Visit: Payer: BLUE CROSS/BLUE SHIELD

## 2020-02-06 LAB — HM MAMMOGRAPHY

## 2020-12-08 ENCOUNTER — Telehealth: Payer: Self-pay | Admitting: *Deleted

## 2020-12-08 NOTE — Telephone Encounter (Signed)
Patient has been notified that annual lung cancer screening low dose CT scan is due  Confirmed that patient is within the age range of 55-77, and asymptomatic, (no signs or symptoms of lung cancer). Patient denies illness that would prevent curative treatment for lung cancer if found. Verified smoking history, (former, quit 07/2014, 35 pack year). Patient is currently vaping and would like help to have cessation class.. Patient is agreeable for CT scan being scheduled, however she has [employer] and it is under [facility] alliance that she is in network with and she wants to make sure that she can have the ct scan here or because of [facility] alliance she may have to have it at [facility] or some kind of fund to asst. Her to get it paid for. She would like a rtn call

## 2020-12-09 NOTE — Telephone Encounter (Signed)
After confirming with authorization team, patient contacted and confirmed that [facility] is out of network for lung cancer screening scans. Encouraged patient to contact her PCP and request order be sent to [facility] imaging or another in network facility. Patient verbalizes understanding.

## 2021-04-06 ENCOUNTER — Other Ambulatory Visit: Payer: Self-pay | Admitting: Oncology

## 2021-04-06 ENCOUNTER — Ambulatory Visit
Admission: RE | Admit: 2021-04-06 | Discharge: 2021-04-06 | Disposition: A | Discharge status: Home / Self Care | Payer: Self-pay | Source: Ambulatory Visit | Attending: Oncology | Admitting: Oncology

## 2021-04-06 DIAGNOSIS — Z122 Encounter for screening for malignant neoplasm of respiratory organs: Secondary | ICD-10-CM

## 2022-11-07 ENCOUNTER — Other Ambulatory Visit: Payer: Self-pay

## 2022-11-07 ENCOUNTER — Emergency Department
Admission: EM | Admit: 2022-11-07 | Discharge: 2022-11-07 | Disposition: A | Discharge status: Home / Self Care | Payer: Medicaid Other | Attending: Emergency Medicine | Admitting: Emergency Medicine

## 2022-11-07 ENCOUNTER — Emergency Department: Payer: Medicaid Other

## 2022-11-07 DIAGNOSIS — J069 Acute upper respiratory infection, unspecified: Secondary | ICD-10-CM | POA: Insufficient documentation

## 2022-11-07 DIAGNOSIS — R059 Cough, unspecified: Secondary | ICD-10-CM | POA: Diagnosis present

## 2022-11-07 DIAGNOSIS — Z1152 Encounter for screening for COVID-19: Secondary | ICD-10-CM | POA: Insufficient documentation

## 2022-11-07 LAB — RESP PANEL BY RT-PCR (RSV, FLU A&B, COVID)  RVPGX2
Influenza A by PCR: NEGATIVE
Influenza B by PCR: NEGATIVE
Resp Syncytial Virus by PCR: NEGATIVE
SARS Coronavirus 2 by RT PCR: NEGATIVE

## 2022-11-07 MED ORDER — AMOXICILLIN 875 MG PO TABS: 875.0000 mg | ORAL_TABLET | Freq: Two times a day (BID) | ORAL | 0 refills | Status: DC

## 2022-11-07 MED ORDER — ONDANSETRON 4 MG PO TBDP: 4.0000 mg | ORAL_TABLET | Freq: Three times a day (TID) | ORAL | 0 refills | Status: DC | PRN

## 2022-11-07 MED ORDER — BENZONATATE 100 MG PO CAPS: 100.0000 mg | ORAL_CAPSULE | Freq: Three times a day (TID) | ORAL | 0 refills | Status: DC | PRN

## 2022-11-07 NOTE — ED Provider Triage Note (Signed)
  Emergency Medicine Provider Triage Evaluation Note  Nicole Walter , a 62 y.o.female,  was evaluated in triage.  Pt complains of cough, congestion, shortness of breath.  She states that she was recently with a family member who tested positive for RSV, she now believes that she has it.  Endorses some cough, congestion, and chest tightness.   Review of Systems  Positive: Cough, congestion, shortness of breath Negative: Denies fever, chest pain, vomiting  Physical Exam  There were no vitals filed for this visit. Gen:   Awake, no distress   Resp:  Normal effort  MSK:   Moves extremities without difficulty  Other:    Medical Decision Making  Given the patient's initial medical screening exam, the following diagnostic evaluation has been ordered. The patient will be placed in the appropriate treatment space, once one is available, to complete the evaluation and treatment. I have discussed the plan of care with the patient and I have advised the patient that an ED physician or mid-level practitioner will reevaluate their condition after the test results have been received, as the results may give them additional insight into the type of treatment they may need.    Diagnostics: Respiratory panel, CXR.  Treatments: none immediately   Teodoro Spray, [place] 11/07/22 1436

## 2022-11-07 NOTE — ED Triage Notes (Signed)
Pt to ED via POV from home. Pt reports couhg, congestion and body aches since Christmas. 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 Pt reports no relief of symptoms with OTC medications.

## 2022-11-07 NOTE — ED Provider Notes (Signed)
[facility] Provider Note    Event Date/Time   First MD Initiated Contact with Patient 11/07/22 1504     (approximate)   History   Cough and Generalized Body Aches   HPI  Khailee Mick is a 62 y.o. female with history of fibromyalgia, anxiety, high cholesterol presents emergency department with cough and congestion since 1231.  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  She is concerned that she now has RSV.  She denies any chest pain/shortness of breath.  States she has used Mucinex and over-the-counter medications without any relief.  She denies any vomiting or diarrhea, although she is nauseated      Physical Exam   Triage Vital Signs: ED Triage Vitals [11/07/22 1502]  Enc Vitals Group     BP (!) 158/106     Pulse Rate 78     Resp 18     Temp 98.4 F (36.9 C)     Temp Source Oral     SpO2 98 %     Weight      Height      Head Circumference      Peak Flow      Pain Score 0     Pain Loc      Pain Edu?      Excl. in [place]?     Most recent vital signs: Vitals:   11/07/22 1502  BP: (!) 158/106  Pulse: 78  Resp: 18  Temp: 98.4 F (36.9 C)  SpO2: 98%     General: Awake, no distress.   CV:  Good peripheral perfusion. regular rate and  rhythm Resp:  Normal effort. Lungs cta Abd:  No distention.   Other:     ED Results / Procedures / Treatments   Labs (all labs ordered are listed, but only abnormal results are displayed) Labs Reviewed  RESP PANEL BY RT-PCR (RSV, FLU A&B, COVID)  [id number]     EKG     RADIOLOGY Chest x-ray    PROCEDURES:   Procedures   MEDICATIONS ORDERED IN ED: Medications - No data to display   IMPRESSION / MDM / [place] / ED COURSE  I reviewed the triage vital signs and the nursing notes.                              Differential diagnosis includes, but is not limited to, acute bronchitis, COVID, RSV, influenza, CAP  Patient's  presentation is most consistent with acute complicated illness / injury requiring diagnostic workup.   Chest x-ray independently reviewed and interpreted by me as being negative for any acute abnormality  Respiratory panel still pending  Patient was agreeable to being discharged prior to the respiratory panel result.  I did explain to her that all of these results are a virus.  There is no treatment for a virus.  She can use over-the-counter medications.  I sent in a prescription for a cough medication and Zofran ODT for nausea.  She is to follow-up with her regular doctor if not improving in 3 days.  Return if worsening.  I will call her with her test results.  Resp panel was negative, called to advise patient of results, called in amoxicillin to [facility]     FINAL CLINICAL IMPRESSION(S) / ED DIAGNOSES   Final diagnoses:  Acute upper respiratory infection     Rx /  DC Orders   ED Discharge Orders          Ordered    ondansetron (ZOFRAN-ODT) 4 MG disintegrating tablet  Every 8 hours PRN        11/07/22 1512    benzonatate (TESSALON PERLES) 100 MG capsule  3 times daily PRN        11/07/22 1512             Note:  This document was prepared using Dragon voice recognition software and may include unintentional dictation errors.    Versie Starks, PA-C 11/07/22 1643    Lavonia Drafts, MD 11/08/22 2049

## 2022-12-15 ENCOUNTER — Encounter: Payer: Self-pay | Admitting: Internal Medicine

## 2022-12-15 ENCOUNTER — Ambulatory Visit: Payer: Medicaid Other | Admitting: Internal Medicine

## 2022-12-15 VITALS — BP 122/80 | HR 71 | Ht 64.0 in | Wt 161.8 lb

## 2022-12-15 DIAGNOSIS — F419 Anxiety disorder, unspecified: Secondary | ICD-10-CM | POA: Diagnosis not present

## 2022-12-15 DIAGNOSIS — E782 Mixed hyperlipidemia: Secondary | ICD-10-CM | POA: Diagnosis not present

## 2022-12-15 DIAGNOSIS — Z720 Tobacco use: Secondary | ICD-10-CM

## 2022-12-15 DIAGNOSIS — K760 Fatty (change of) liver, not elsewhere classified: Secondary | ICD-10-CM | POA: Diagnosis not present

## 2022-12-15 DIAGNOSIS — M19011 Primary osteoarthritis, right shoulder: Secondary | ICD-10-CM

## 2022-12-15 DIAGNOSIS — F32A Depression, unspecified: Secondary | ICD-10-CM

## 2022-12-15 MED ORDER — PREDNISONE 20 MG PO TABS: 40.0000 mg | ORAL_TABLET | Freq: Every day | ORAL | Status: AC

## 2022-12-15 MED ORDER — VORTIOXETINE HBR 5 MG PO TABS: 5.0000 mg | ORAL_TABLET | Freq: Three times a day (TID) | ORAL | 0 refills | Status: DC

## 2022-12-15 MED ORDER — CELECOXIB 400 MG PO CAPS: 400.0000 mg | ORAL_CAPSULE | Freq: Every day | ORAL | 0 refills | Status: AC

## 2022-12-15 NOTE — Progress Notes (Signed)
Established Patient Office Visit  Subjective:  Patient ID: Nicole Walter, female    DOB: October 20, 1961  Age: 62 y.o. MRN: [id number]  Chief Complaint  Patient presents with   Follow-up    Follow up    HPI Lost to follow up due to lack of insurance coverage and returns to re-establish care. C/o worsening depression but hasn't taken meds for over a year. C/o recurrence of right shoulder pain.  Past Medical History:  Diagnosis Date   Abnormal liver enzymes 02/08/2016   Allergy    Anxiety    Chest pain 07/05/2016   Chronic pain    Depression    DJD (degenerative joint disease)    lumbar   Fibromyalgia    High cholesterol    Rash of back 01/28/2016   Weight loss due to medication     Social History   Socioeconomic History   Marital status: Married    Spouse name: Not on file   Number of children: Not on file   Years of education: Not on file   Highest education level: Not on file  Occupational History   Not on file  Tobacco Use   Smoking status: Every Day    Packs/day: 1.00    Years: 35.00    Total pack years: 35.00    Types: E-cigarettes, Cigarettes    Start date: 03/06/1974    Last attempt to quit: 07/08/2015    Years since quitting: 7.4   Smokeless tobacco: Never   Tobacco comments:    vapor cigarettes, no nicotene  Vaping Use   Vaping Use: Every day   Start date: 07/08/2015   Substances: Flavoring   Devices: Nort, Geek  Substance and Sexual Activity   Alcohol use: No    Alcohol/week: 0.0 standard drinks of alcohol   Drug use: No   Sexual activity: Not Currently    Birth control/protection: Post-menopausal  Other Topics Concern   Not on file  Social History Narrative   Not on file   Social Determinants of Health   Financial Resource Strain: Not on file  Food Insecurity: Not on file  Transportation Needs: Not on file  Physical Activity: Not on file  Stress: Not on file  Social Connections: Not on file  Intimate Partner Violence: Not on file    Family  History  Problem Relation Age of Onset   Heart disease Mother    Stroke Mother    Cancer Father        lung   Arthritis Father    Arthritis Paternal Grandmother    Arthritis Paternal Grandfather    Drug abuse Sister    Drug abuse Brother    Post-traumatic stress disorder Brother    Diabetes Sister    Dementia Sister    Drug abuse Brother    Cancer Brother     Allergies  Allergen Reactions   Other Hives and Other (See Comments)    Pt states she was tested for allergies and peas was one that she is allergic to but has never ate enough of them to see a reaction. Pt states when she is around bird feathers she gets the hives FEATHERS   Adhesive [Tape] Hives   Food     PEAS/ patient does not know what type reaction she has/ from allergy test during childhood   Pea Other (See Comments)    Other reaction(Nicole Walter): Unknown.  Pt states she was tested for allergies and peas was one that she is allergic to but  has never ate enough of them to see a reaction.    Review of Systems  Constitutional: Negative.   HENT: Negative.    Eyes: Negative.   Respiratory: Negative.    Cardiovascular: Negative.   Gastrointestinal: Negative.   Genitourinary: Negative.   Musculoskeletal:  Positive for back pain and joint pain.  Skin: Negative.   Neurological: Negative.   Endo/Heme/Allergies: Negative.   Psychiatric/Behavioral:  Positive for depression. Negative for suicidal ideas.        Objective:   BP 122/80   Pulse 71   Ht 5' 4"$  (1.626 m)   Wt 161 lb 12.8 oz (73.4 kg)   SpO2 99%   BMI 27.77 kg/m   Vitals:   12/15/22 1535  BP: 122/80  Pulse: 71  Height: 5' 4"$  (1.626 m)  Weight: 161 lb 12.8 oz (73.4 kg)  SpO2: 99%  BMI (Calculated): 27.76    Physical Exam Vitals reviewed.  Constitutional:      General: She is not in acute distress.    Appearance: She is obese.  HENT:     Head: Normocephalic.     Nose: Nose normal.     Mouth/Throat:     Mouth: Mucous membranes are moist.   Eyes:     Extraocular Movements: Extraocular movements intact.     Pupils: Pupils are equal, round, and reactive to light.  Cardiovascular:     Rate and Rhythm: Normal rate and regular rhythm.     Heart sounds: No murmur heard. Pulmonary:     Effort: Pulmonary effort is normal.     Breath sounds: No rhonchi or rales.  Abdominal:     General: Abdomen is flat.     Palpations: There is no hepatomegaly, splenomegaly or mass.  Musculoskeletal:        General: Normal range of motion.     Cervical back: Normal range of motion. No tenderness.  Skin:    General: Skin is warm and dry.  Neurological:     General: No focal deficit present.     Mental Status: She is alert and oriented to person, place, and time.     Cranial Nerves: No cranial nerve deficit.     Motor: No weakness.  Psychiatric:        Mood and Affect: Mood normal.        Behavior: Behavior normal.      No results found for any visits on 12/15/22.  Recent Results (from the past 2160 hour(Kyland No))  Resp panel by RT-PCR (RSV, Flu A&B, Covid) Anterior Nasal Swab     Status: None   Collection Time: 11/07/22  2:35 PM   Specimen: Anterior Nasal Swab  Result Value Ref Range   SARS Coronavirus 2 by RT PCR NEGATIVE NEGATIVE    Comment: (NOTE) SARS-CoV-2 target nucleic acids are NOT DETECTED.  The SARS-CoV-2 RNA is generally detectable in upper respiratory specimens during the acute phase of infection. The lowest concentration of SARS-CoV-2 viral copies this assay can detect is 138 copies/mL. A negative result does not preclude SARS-Cov-2 infection and should not be used as the sole basis for treatment or other patient management decisions. A negative result may occur with  improper specimen collection/handling, submission of specimen other than nasopharyngeal swab, presence of viral mutation(Nicole Walter) within the areas targeted by this assay, and inadequate number of viral copies(<138 copies/mL). A negative result must be combined  with clinical observations, patient history, and epidemiological information. The expected result is Negative.  Fact Sheet for Patients:  EntrepreneurPulse.com.au  Fact Sheet for Healthcare Providers:  IncredibleEmployment.be  This test is no t yet approved or cleared by the [place] FDA and  has been authorized for detection and/or diagnosis of SARS-CoV-2 by FDA under an Emergency Use Authorization (EUA). This EUA will remain  in effect (meaning this test can be used) for the duration of the COVID-19 declaration under Section 564(b)(1) of the Act, 21 U.Theresea Trautmann.C.section 360bbb-3(b)(1), unless the authorization is terminated  or revoked sooner.       Influenza A by PCR NEGATIVE NEGATIVE   Influenza B by PCR NEGATIVE NEGATIVE    Comment: (NOTE) The Xpert Xpress SARS-CoV-2/FLU/RSV plus assay is intended as an aid in the diagnosis of influenza from Nasopharyngeal swab specimens and should not be used as a sole basis for treatment. Nasal washings and aspirates are unacceptable for Xpert Xpress SARS-CoV-2/FLU/RSV testing.  Fact Sheet for Patients: EntrepreneurPulse.com.au  Fact Sheet for Healthcare Providers: IncredibleEmployment.be  This test is not yet approved or cleared by the [place] FDA and has been authorized for detection and/or diagnosis of SARS-CoV-2 by FDA under an Emergency Use Authorization (EUA). This EUA will remain in effect (meaning this test can be used) for the duration of the COVID-19 declaration under Section 564(b)(1) of the Act, 21 U.Kaetlin Bullen.C. section 360bbb-3(b)(1), unless the authorization is terminated or revoked.     Resp Syncytial Virus by PCR NEGATIVE NEGATIVE    Comment: (NOTE) Fact Sheet for Patients: EntrepreneurPulse.com.au  Fact Sheet for Healthcare Providers: IncredibleEmployment.be  This test is not yet approved or cleared by the  [place] FDA and has been authorized for detection and/or diagnosis of SARS-CoV-2 by FDA under an Emergency Use Authorization (EUA). This EUA will remain in effect (meaning this test can be used) for the duration of the COVID-19 declaration under Section 564(b)(1) of the Act, 21 U.Nykeem Citro.C. section 360bbb-3(b)(1), unless the authorization is terminated or revoked.  Performed at [facility], [address], [place], [place] [address]       Assessment & Plan:   Problem List Items Addressed This Visit   None .dia  @VISPATINSTR$ @  No follow-ups on file.   Total time spent: 20 minutes  Volanda Napoleon, MD  12/15/2022

## 2022-12-21 ENCOUNTER — Other Ambulatory Visit: Payer: Self-pay

## 2022-12-21 ENCOUNTER — Ambulatory Visit (INDEPENDENT_AMBULATORY_CARE_PROVIDER_SITE_OTHER): Payer: Medicaid Other

## 2022-12-21 ENCOUNTER — Other Ambulatory Visit: Payer: Self-pay | Admitting: Internal Medicine

## 2022-12-21 DIAGNOSIS — F17219 Nicotine dependence, cigarettes, with unspecified nicotine-induced disorders: Secondary | ICD-10-CM | POA: Diagnosis not present

## 2022-12-21 DIAGNOSIS — F32A Depression, unspecified: Secondary | ICD-10-CM

## 2022-12-21 MED ORDER — LINZESS 72 MCG PO CAPS: ORAL_CAPSULE | ORAL | 3 refills | Status: DC

## 2022-12-21 MED ORDER — PREDNISONE 20 MG PO TABS: ORAL_TABLET | ORAL | 0 refills | Status: DC

## 2022-12-21 MED ORDER — VORTIOXETINE HBR 5 MG PO TABS: 5.0000 mg | ORAL_TABLET | Freq: Three times a day (TID) | ORAL | 0 refills | Status: DC

## 2022-12-25 ENCOUNTER — Other Ambulatory Visit: Payer: Medicaid Other

## 2022-12-25 DIAGNOSIS — E785 Hyperlipidemia, unspecified: Secondary | ICD-10-CM

## 2022-12-25 DIAGNOSIS — E663 Overweight: Secondary | ICD-10-CM

## 2022-12-25 DIAGNOSIS — I10 Essential (primary) hypertension: Secondary | ICD-10-CM

## 2022-12-25 DIAGNOSIS — K760 Fatty (change of) liver, not elsewhere classified: Secondary | ICD-10-CM

## 2022-12-25 DIAGNOSIS — R7301 Impaired fasting glucose: Secondary | ICD-10-CM

## 2022-12-26 LAB — CBC WITH DIFFERENTIAL
Basophils Absolute: 0.1 10*3/uL (ref 0.0–0.2)
Basos: 1 %
EOS (ABSOLUTE): 0.1 10*3/uL (ref 0.0–0.4)
Eos: 1 %
Hematocrit: 39.5 % (ref 34.0–46.6)
Hemoglobin: 13.3 g/dL (ref 11.1–15.9)
Immature Grans (Abs): 0 10*3/uL (ref 0.0–0.1)
Immature Granulocytes: 0 %
Lymphocytes Absolute: 3.9 10*3/uL — ABNORMAL HIGH (ref 0.7–3.1)
Lymphs: 39 %
MCH: 30.1 pg (ref 26.6–33.0)
MCHC: 33.7 g/dL (ref 31.5–35.7)
MCV: 89 fL (ref 79–97)
Monocytes Absolute: 0.8 10*3/uL (ref 0.1–0.9)
Monocytes: 8 %
Neutrophils Absolute: 5.1 10*3/uL (ref 1.4–7.0)
Neutrophils: 51 %
RBC: 4.42 x10E6/uL (ref 3.77–5.28)
RDW: 13.5 % (ref 11.7–15.4)
WBC: 9.9 10*3/uL (ref 3.4–10.8)

## 2022-12-26 LAB — CMP14+EGFR
ALT: 24 IU/L (ref 0–32)
AST: 17 IU/L (ref 0–40)
Albumin/Globulin Ratio: 2.1 (ref 1.2–2.2)
Albumin: 4.7 g/dL (ref 3.8–4.9)
Alkaline Phosphatase: 83 IU/L (ref 44–121)
BUN/Creatinine Ratio: 22 (ref 12–28)
BUN: 17 mg/dL (ref 8–27)
Bilirubin Total: 0.5 mg/dL (ref 0.0–1.2)
CO2: 25 mmol/L (ref 20–29)
Calcium: 9.9 mg/dL (ref 8.7–10.3)
Chloride: 104 mmol/L (ref 96–106)
Creatinine, Ser: 0.79 mg/dL (ref 0.57–1.00)
Globulin, Total: 2.2 g/dL (ref 1.5–4.5)
Glucose: 81 mg/dL (ref 70–99)
Potassium: 3.7 mmol/L (ref 3.5–5.2)
Sodium: 144 mmol/L (ref 134–144)
Total Protein: 6.9 g/dL (ref 6.0–8.5)
eGFR: 86 mL/min/{1.73_m2} (ref >59)

## 2022-12-26 LAB — HEMOGLOBIN A1C
Est. average glucose Bld gHb Est-mCnc: 111 mg/dL
Hgb A1c MFr Bld: 5.5 % (ref 4.8–5.6)

## 2022-12-26 LAB — LIPID PANEL
Chol/HDL Ratio: 3.3 ratio (ref 0.0–4.4)
Cholesterol, Total: 224 mg/dL — ABNORMAL HIGH (ref 100–199)
HDL: 68 mg/dL (ref >39)
LDL Chol Calc (NIH): 142 mg/dL — ABNORMAL HIGH (ref 0–99)
Triglycerides: 80 mg/dL (ref 0–149)
VLDL Cholesterol Cal: 14 mg/dL (ref 5–40)

## 2022-12-26 LAB — TSH: TSH: 1.35 u[IU]/mL (ref 0.450–4.500)

## 2022-12-29 ENCOUNTER — Ambulatory Visit: Payer: Medicaid Other | Admitting: Internal Medicine

## 2022-12-29 VITALS — BP 130/82 | HR 71 | Ht 63.0 in | Wt 161.2 lb

## 2022-12-29 DIAGNOSIS — M5414 Radiculopathy, thoracic region: Secondary | ICD-10-CM | POA: Diagnosis not present

## 2022-12-29 DIAGNOSIS — G894 Chronic pain syndrome: Secondary | ICD-10-CM

## 2022-12-29 DIAGNOSIS — E782 Mixed hyperlipidemia: Secondary | ICD-10-CM | POA: Diagnosis not present

## 2022-12-29 DIAGNOSIS — M62838 Other muscle spasm: Secondary | ICD-10-CM

## 2022-12-29 MED ORDER — DIAZEPAM 10 MG PO TABS: 10.0000 mg | ORAL_TABLET | Freq: Three times a day (TID) | ORAL | 2 refills | Status: DC

## 2022-12-29 NOTE — Progress Notes (Signed)
Established Patient Office Visit  Subjective:  Patient ID: Nicole Walter, female    DOB: [birth year]  Age: 62 y.o. MRN: [id number]  Chief Complaint  Patient presents with   Follow-up    2 week follow up    C/o mid back pain. Labs reviewed and notable for well controlled lipids, normal A1c, tsh, unremarkable cmp while cbc only notable for lymphocytosis.     Past Medical History:  Diagnosis Date   Abnormal liver enzymes 02/08/2016   Allergy    Anxiety    Chest pain 07/05/2016   Chronic pain    Depression    DJD (degenerative joint disease)    lumbar   Fibromyalgia    High cholesterol    Rash of back 01/28/2016   Weight loss due to medication     Social History   Socioeconomic History   Marital status: Married    Spouse name: Not on file   Number of children: Not on file   Years of education: Not on file   Highest education level: Not on file  Occupational History   Not on file  Tobacco Use   Smoking status: Every Day    Packs/day: 1.00    Years: 35.00    Total pack years: 35.00    Types: E-cigarettes, Cigarettes    Start date: 03/06/1974    Last attempt to quit: 07/08/2015    Years since quitting: 7.4   Smokeless tobacco: Never   Tobacco comments:    vapor cigarettes, no nicotene  Vaping Use   Vaping Use: Every day   Start date: 07/08/2015   Substances: Flavoring   Devices: Nort, Geek  Substance and Sexual Activity   Alcohol use: No    Alcohol/week: 0.0 standard drinks of alcohol   Drug use: No   Sexual activity: Not Currently    Birth control/protection: Post-menopausal  Other Topics Concern   Not on file  Social History Narrative   Not on file   Social Determinants of Health   Financial Resource Strain: Not on file  Food Insecurity: Not on file  Transportation Needs: Not on file  Physical Activity: Not on file  Stress: Not on file  Social Connections: Not on file  Intimate Partner Violence: Not on file    Family History  Problem Relation Age  of Onset   Heart disease Mother    Stroke Mother    Cancer Father        lung   Arthritis Father    Arthritis Paternal Grandmother    Arthritis Paternal Grandfather    Drug abuse Sister    Drug abuse Brother    Post-traumatic stress disorder Brother    Diabetes Sister    Dementia Sister    Drug abuse Brother    Cancer Brother     Allergies  Allergen Reactions   Other Hives and Other (See Comments)    Pt states she was tested for allergies and peas was one that she is allergic to but has never ate enough of them to see a reaction. Pt states when she is around bird feathers she gets the hives FEATHERS   Adhesive [Tape] Hives   Food     PEAS/ patient does not know what type reaction she has/ from allergy test during childhood   Pea Other (See Comments)    Other reaction(Aristide Waggle): Unknown.  Pt states she was tested for allergies and peas was one that she is allergic to but has never ate enough of  them to see a reaction.    Review of Systems  Constitutional: Negative.   HENT: Negative.    Eyes: Negative.   Respiratory: Negative.    Cardiovascular: Negative.   Gastrointestinal: Negative.   Genitourinary: Negative.   Musculoskeletal:        As in hpi  Skin: Negative.   Neurological: Negative.   Endo/Heme/Allergies: Negative.        Objective:   BP 130/82   Pulse 71   Ht '5\' 3"'$  (1.6 m)   Wt 161 lb 3.2 oz (73.1 kg)   SpO2 98%   BMI 28.56 kg/m   Vitals:   12/29/22 1509  BP: 130/82  Pulse: 71  Height: '5\' 3"'$  (1.6 m)  Weight: 161 lb 3.2 oz (73.1 kg)  SpO2: 98%  BMI (Calculated): 28.56    Physical Exam Musculoskeletal:     Thoracic back: Spasms and tenderness present.     Lumbar back: Normal.      No results found for any visits on 12/29/22.  Recent Results (from the past 2160 hour(Gotham Raden))  Resp panel by RT-PCR (RSV, Flu A&B, Covid) Anterior Nasal Swab     Status: None   Collection Time: 11/07/22  2:35 PM   Specimen: Anterior Nasal Swab  Result Value Ref Range    SARS Coronavirus 2 by RT PCR NEGATIVE NEGATIVE    Comment: (NOTE) SARS-CoV-2 target nucleic acids are NOT DETECTED.  The SARS-CoV-2 RNA is generally detectable in upper respiratory specimens during the acute phase of infection. The lowest concentration of SARS-CoV-2 viral copies this assay can detect is 138 copies/mL. A negative result does not preclude SARS-Cov-2 infection and should not be used as the sole basis for treatment or other patient management decisions. A negative result may occur with  improper specimen collection/handling, submission of specimen other than nasopharyngeal swab, presence of viral mutation(Chadwick Reiswig) within the areas targeted by this assay, and inadequate number of viral copies(<138 copies/mL). A negative result must be combined with clinical observations, patient history, and epidemiological information. The expected result is Negative.  Fact Sheet for Patients:  EntrepreneurPulse.com.au  Fact Sheet for Healthcare Providers:  IncredibleEmployment.be  This test is no t yet approved or cleared by the [place] FDA and  has been authorized for detection and/or diagnosis of SARS-CoV-2 by FDA under an Emergency Use Authorization (EUA). This EUA will remain  in effect (meaning this test can be used) for the duration of the COVID-19 declaration under Section 564(b)(1) of the Act, 21 U.Tynslee Bowlds.C.section 360bbb-3(b)(1), unless the authorization is terminated  or revoked sooner.       Influenza A by PCR NEGATIVE NEGATIVE   Influenza B by PCR NEGATIVE NEGATIVE    Comment: (NOTE) The Xpert Xpress SARS-CoV-2/FLU/RSV plus assay is intended as an aid in the diagnosis of influenza from Nasopharyngeal swab specimens and should not be used as a sole basis for treatment. Nasal washings and aspirates are unacceptable for Xpert Xpress SARS-CoV-2/FLU/RSV testing.  Fact Sheet for Patients: EntrepreneurPulse.com.au  Fact  Sheet for Healthcare Providers: IncredibleEmployment.be  This test is not yet approved or cleared by the [place] FDA and has been authorized for detection and/or diagnosis of SARS-CoV-2 by FDA under an Emergency Use Authorization (EUA). This EUA will remain in effect (meaning this test can be used) for the duration of the COVID-19 declaration under Section 564(b)(1) of the Act, 21 U.Roderick Sweezy.C. section 360bbb-3(b)(1), unless the authorization is terminated or revoked.     Resp Syncytial Virus by PCR NEGATIVE NEGATIVE  Comment: (NOTE) Fact Sheet for Patients: EntrepreneurPulse.com.au  Fact Sheet for Healthcare Providers: IncredibleEmployment.be  This test is not yet approved or cleared by the [place] FDA and has been authorized for detection and/or diagnosis of SARS-CoV-2 by FDA under an Emergency Use Authorization (EUA). This EUA will remain in effect (meaning this test can be used) for the duration of the COVID-19 declaration under Section 564(b)(1) of the Act, 21 U.Gervis Gaba.C. section 360bbb-3(b)(1), unless the authorization is terminated or revoked.  Performed at [facility], [address] [place],  [address]   CMP14+EGFR     Status: None   Collection Time: 12/25/22  8:46 AM  Result Value Ref Range   Glucose 81 70 - 99 mg/dL   BUN 17 8 - 27 mg/dL   Creatinine, Ser 0.79 0.57 - 1.00 mg/dL   eGFR 86 >59 mL/min/1.73   BUN/Creatinine Ratio 22 12 - 28   Sodium 144 134 - 144 mmol/L   Potassium 3.7 3.5 - 5.2 mmol/L   Chloride 104 96 - 106 mmol/L   CO2 25 20 - 29 mmol/L   Calcium 9.9 8.7 - 10.3 mg/dL   Total Protein 6.9 6.0 - 8.5 g/dL   Albumin 4.7 3.8 - 4.9 g/dL   Globulin, Total 2.2 1.5 - 4.5 g/dL   Albumin/Globulin Ratio 2.1 1.2 - 2.2   Bilirubin Total 0.5 0.0 - 1.2 mg/dL   Alkaline Phosphatase 83 44 - 121 IU/L   AST 17 0 - 40 IU/L   ALT 24 0 - 32 IU/L  CBC With Differential     Status: Abnormal    Collection Time: 12/25/22  8:46 AM  Result Value Ref Range   WBC 9.9 3.4 - 10.8 x10E3/uL   RBC 4.42 3.77 - 5.28 x10E6/uL   Hemoglobin 13.3 11.1 - 15.9 g/dL   Hematocrit 39.5 34.0 - 46.6 %   MCV 89 79 - 97 fL   MCH 30.1 26.6 - 33.0 pg   MCHC 33.7 31.5 - 35.7 g/dL   RDW 13.5 11.7 - 15.4 %   Neutrophils 51 Not Estab. %   Lymphs 39 Not Estab. %   Monocytes 8 Not Estab. %   Eos 1 Not Estab. %   Basos 1 Not Estab. %   Neutrophils Absolute 5.1 1.4 - 7.0 x10E3/uL   Lymphocytes Absolute 3.9 (H) 0.7 - 3.1 x10E3/uL   Monocytes Absolute 0.8 0.1 - 0.9 x10E3/uL   EOS (ABSOLUTE) 0.1 0.0 - 0.4 x10E3/uL   Basophils Absolute 0.1 0.0 - 0.2 x10E3/uL   Immature Granulocytes 0 Not Estab. %   Immature Grans (Abs) 0.0 0.0 - 0.1 x10E3/uL  Lipid panel     Status: Abnormal   Collection Time: 12/25/22  8:46 AM  Result Value Ref Range   Cholesterol, Total 224 (H) 100 - 199 mg/dL   Triglycerides 80 0 - 149 mg/dL   HDL 68 >39 mg/dL   VLDL Cholesterol Cal 14 5 - 40 mg/dL   LDL Chol Calc (NIH) 142 (H) 0 - 99 mg/dL   Chol/HDL Ratio 3.3 0.0 - 4.4 ratio    Comment:                                   T. Chol/HDL Ratio  Men  Women                               1/2 Avg.Risk  3.4    3.3                                   Avg.Risk  5.0    4.4                                2X Avg.Risk  9.6    7.1                                3X Avg.Risk 23.4   11.0   Hemoglobin A1c     Status: None   Collection Time: 12/25/22  8:46 AM  Result Value Ref Range   Hgb A1c MFr Bld 5.5 4.8 - 5.6 %    Comment:          Prediabetes: 5.7 - 6.4          Diabetes: >6.4          Glycemic control for adults with diabetes: <7.0    Est. average glucose Bld gHb Est-mCnc 111 mg/dL  TSH     Status: None   Collection Time: 12/25/22  8:46 AM  Result Value Ref Range   TSH 1.350 0.450 - 4.500 uIU/mL      Assessment & Plan:   Problem List Items Addressed This Visit   None   No follow-ups on  file.   Total time spent: 30 minutes  Volanda Napoleon, MD  12/29/2022

## 2023-01-15 ENCOUNTER — Ambulatory Visit: Payer: Medicaid Other | Admitting: Internal Medicine

## 2023-01-15 ENCOUNTER — Encounter: Payer: Self-pay | Admitting: Internal Medicine

## 2023-01-15 VITALS — BP 130/70 | HR 76 | Ht 61.0 in | Wt 169.8 lb

## 2023-01-15 DIAGNOSIS — G894 Chronic pain syndrome: Secondary | ICD-10-CM

## 2023-01-15 DIAGNOSIS — F331 Major depressive disorder, recurrent, moderate: Secondary | ICD-10-CM

## 2023-01-15 DIAGNOSIS — F32A Depression, unspecified: Secondary | ICD-10-CM

## 2023-01-15 DIAGNOSIS — F419 Anxiety disorder, unspecified: Secondary | ICD-10-CM

## 2023-01-15 DIAGNOSIS — J302 Other seasonal allergic rhinitis: Secondary | ICD-10-CM

## 2023-01-15 DIAGNOSIS — N3941 Urge incontinence: Secondary | ICD-10-CM

## 2023-01-15 MED ORDER — AZELASTINE HCL 137 MCG/SPRAY NA SOLN: 1.0000 | Freq: Every day | NASAL | 2 refills | Status: DC

## 2023-01-15 MED ORDER — CETIRIZINE HCL 10 MG PO TABS: 10.0000 mg | ORAL_TABLET | Freq: Every morning | ORAL | 2 refills | Status: DC

## 2023-01-15 NOTE — Addendum Note (Signed)
Addended by: Wyvonna Plum on: 01/15/2023 04:15 PM   Modules accepted: Orders

## 2023-01-15 NOTE — Progress Notes (Signed)
Established Patient Office Visit  Subjective:  Patient ID: Nicole Walter, female    DOB: [birth year]  Age: 62 y.o. MRN: [id number]  Chief Complaint  Patient presents with   Acute Visit    Requesting pain meds    C/o uncontrolled pain and would like to resume Oxycodone. Scheduled to follow up with pain specialist soon but they no longer provide narcotic analgesia. Also c/o recurrence of urge incontinence after undergoing pelvic floor PT several years ago.     Past Medical History:  Diagnosis Date   Abnormal liver enzymes 02/08/2016   Allergy    Anxiety    Chest pain 07/05/2016   Chronic pain    Depression    DJD (degenerative joint disease)    lumbar   Fibromyalgia    High cholesterol    Rash of back 01/28/2016   Weight loss due to medication     Past Surgical History:  Procedure Laterality Date   ANKLE SURGERY     x 2   BREAST BIOPSY Left 1997   CHOLECYSTECTOMY     COLONOSCOPY     COLONOSCOPY WITH PROPOFOL N/A 02/22/2016   Procedure: COLONOSCOPY WITH PROPOFOL;  Surgeon: Lollie Sails, MD;  Location: [facility] ENDOSCOPY;  Service: Endoscopy;  Laterality: N/A;   ESOPHAGOGASTRODUODENOSCOPY (EGD) WITH PROPOFOL N/A 02/22/2016   Procedure: ESOPHAGOGASTRODUODENOSCOPY (EGD) WITH PROPOFOL;  Surgeon: Lollie Sails, MD;  Location: [facility] ENDOSCOPY;  Service: Endoscopy;  Laterality: N/A;   NASAL SINUS SURGERY     SHOULDER ARTHROSCOPY WITH ROTATOR CUFF REPAIR AND SUBACROMIAL DECOMPRESSION Left 09/23/2019   Procedure: SHOULDER ARTHROSCOPY WITH MINI ROTATOR CUFF REPAIR AND SUBACROMIAL DECOMPRESSION, BICEPS TENODESIS;  Surgeon: Leim Fabry, MD;  Location: [place];  Service: Orthopedics;  Laterality: Left;   TONSILLECTOMY     TUBAL LIGATION      Social History   Socioeconomic History   Marital status: Legally Separated    Spouse name: Not on file   Number of children: Not on file   Years of education: Not on file   Highest education level: Not on file  Occupational  History   Not on file  Tobacco Use   Smoking status: Every Day    Packs/day: 1.00    Years: 35.00    Total pack years: 35.00    Types: E-cigarettes, Cigarettes    Start date: 03/06/1974    Last attempt to quit: 07/08/2015    Years since quitting: 7.5   Smokeless tobacco: Never   Tobacco comments:    vapor cigarettes, no nicotene  Vaping Use   Vaping Use: Every day   Start date: 07/08/2015   Substances: Flavoring   Devices: Nort, Geek  Substance and Sexual Activity   Alcohol use: No    Alcohol/week: 0.0 standard drinks of alcohol   Drug use: No   Sexual activity: Not Currently    Birth control/protection: Post-menopausal  Other Topics Concern   Not on file  Social History Narrative   Not on file   Social Determinants of Health   Financial Resource Strain: Not on file  Food Insecurity: Not on file  Transportation Needs: Not on file  Physical Activity: Not on file  Stress: Not on file  Social Connections: Not on file  Intimate Partner Violence: Not on file    Family History  Problem Relation Age of Onset   Heart disease Mother    Stroke Mother    Cancer Father        lung   Arthritis  Father    Arthritis Paternal Grandmother    Arthritis Paternal Grandfather    Drug abuse Sister    Drug abuse Brother    Post-traumatic stress disorder Brother    Diabetes Sister    Dementia Sister    Drug abuse Brother    Cancer Brother     Allergies  Allergen Reactions   Other Hives and Other (See Comments)    Pt states she was tested for allergies and peas was one that she is allergic to but has never ate enough of them to see a reaction. Pt states when she is around bird feathers she gets the hives FEATHERS   Adhesive [Tape] Hives   Food     PEAS/ patient does not know what type reaction she has/ from allergy test during childhood   Pea Other (See Comments)    Other reaction(Katilyn Miltenberger): Unknown.  Pt states she was tested for allergies and peas was one that she is allergic to but  has never ate enough of them to see a reaction.    Review of Systems  Constitutional: Negative.   HENT: Negative.    Eyes: Negative.   Respiratory: Negative.    Cardiovascular: Negative.   Gastrointestinal: Negative.   Genitourinary: Negative.        Urge incontinence  Musculoskeletal:        As in hpi  Skin: Negative.   Neurological: Negative.   Endo/Heme/Allergies: Negative.        Objective:   BP 130/70   Pulse 76   Ht '5\' 1"'$  (1.549 m)   Wt 169 lb 12.8 oz (77 kg)   SpO2 95%   BMI 32.08 kg/m   Vitals:   01/15/23 1515  BP: 130/70  Pulse: 76  Height: '5\' 1"'$  (1.549 m)  Weight: 169 lb 12.8 oz (77 kg)  SpO2: 95%  BMI (Calculated): 32.1    Physical Exam Vitals reviewed.  Constitutional:      General: She is not in acute distress. HENT:     Head: Normocephalic.     Nose: Nose normal.     Mouth/Throat:     Mouth: Mucous membranes are moist.  Eyes:     Extraocular Movements: Extraocular movements intact.     Pupils: Pupils are equal, round, and reactive to light.  Cardiovascular:     Rate and Rhythm: Normal rate and regular rhythm.     Heart sounds: No murmur heard. Pulmonary:     Effort: Pulmonary effort is normal.     Breath sounds: No rhonchi or rales.  Abdominal:     General: Abdomen is flat.     Palpations: There is no hepatomegaly, splenomegaly or mass.  Musculoskeletal:        General: Normal range of motion.     Cervical back: Normal range of motion. No tenderness.  Skin:    General: Skin is warm and dry.  Neurological:     General: No focal deficit present.     Mental Status: She is alert and oriented to person, place, and time.     Cranial Nerves: No cranial nerve deficit.     Motor: No weakness.  Psychiatric:        Mood and Affect: Mood normal.        Behavior: Behavior normal.      No results found for any visits on 01/15/23.      Assessment & Plan:   Problem List Items Addressed This Visit   None 1. Anxiety and  depression  2. Moderate episode of recurrent major depressive disorder ([place])  3. Chronic pain syndrome - Drug Screen 13 w/Conf, WB  4. Seasonal allergic rhinitis, unspecified trigger - cetirizine (ZYRTEC) 10 MG tablet; Take 1 tablet (10 mg total) by mouth every morning.  Dispense: 30 tablet; Refill: 2 - Azelastine HCl 137 MCG/SPRAY SOLN; Place 1 Inhalation into the nose daily.  Dispense: 30 mL; Refill: 2  5. Urge incontinence - Ambulatory referral to Physical Therapy    No follow-ups on file.   Total time spent: 20 minutes  Volanda Napoleon, MD  01/15/2023

## 2023-01-17 ENCOUNTER — Telehealth: Payer: Self-pay

## 2023-01-17 ENCOUNTER — Other Ambulatory Visit: Payer: Self-pay | Admitting: Internal Medicine

## 2023-01-17 MED ORDER — OXYCODONE HCL 5 MG PO TABS: 5.0000 mg | ORAL_TABLET | Freq: Four times a day (QID) | ORAL | 0 refills | Status: DC | PRN

## 2023-01-17 NOTE — Telephone Encounter (Signed)
Informed via my chart.

## 2023-01-17 NOTE — Telephone Encounter (Signed)
Patient called stating that she called to confirm her PM appt on Monday and they told her its with Dr Donley Redder and that if she tried to switch to see Dr Holley Raring like who she thought she was seeing she will have to start over with the process

## 2023-01-22 ENCOUNTER — Ambulatory Visit: Payer: Medicaid Other | Admitting: Pain Medicine

## 2023-01-25 ENCOUNTER — Ambulatory Visit (INDEPENDENT_AMBULATORY_CARE_PROVIDER_SITE_OTHER): Payer: Medicaid Other | Admitting: Nurse Practitioner

## 2023-01-25 ENCOUNTER — Encounter: Payer: Self-pay | Admitting: Nurse Practitioner

## 2023-01-25 VITALS — BP 122/78 | HR 74 | Ht 64.0 in | Wt 169.6 lb

## 2023-01-25 DIAGNOSIS — Z202 Contact with and (suspected) exposure to infections with a predominantly sexual mode of transmission: Secondary | ICD-10-CM | POA: Diagnosis not present

## 2023-01-25 DIAGNOSIS — N760 Acute vaginitis: Secondary | ICD-10-CM

## 2023-01-25 DIAGNOSIS — Z0001 Encounter for general adult medical examination with abnormal findings: Secondary | ICD-10-CM

## 2023-01-25 DIAGNOSIS — R87619 Unspecified abnormal cytological findings in specimens from cervix uteri: Secondary | ICD-10-CM

## 2023-01-25 DIAGNOSIS — Z1272 Encounter for screening for malignant neoplasm of vagina: Secondary | ICD-10-CM | POA: Diagnosis not present

## 2023-01-25 LAB — BENZODIAZEPINES,MS,WB/SP RFX
7-Aminoclonazepam: NEGATIVE ng/mL
Alprazolam: NEGATIVE ng/mL
Benzodiazepines Confirm: POSITIVE
Chlordiazepoxide: NEGATIVE
Clonazepam: NEGATIVE ng/mL
Desalkylflurazepam: NEGATIVE ng/mL
Desmethylchlordiazepoxide: NEGATIVE
Desmethyldiazepam: 501 ng/mL
Diazepam: 569 ng/mL
Flurazepam: NEGATIVE ng/mL
Lorazepam: NEGATIVE ng/mL
Midazolam: NEGATIVE ng/mL
Oxazepam: 17 ng/mL
Temazepam: 24 ng/mL
Triazolam: NEGATIVE ng/mL

## 2023-01-25 LAB — DRUG SCREEN 13 W/CONF , SERUM
Amphetamines, IA: NEGATIVE ng/mL
Barbiturates, IA: NEGATIVE ug/mL
Benzodiazepines, IA: POSITIVE ng/mL — AB
Cocaine & Metabolite, IA: NEGATIVE ng/mL
FENTANYL, IA: NEGATIVE ng/mL
MEPERIDINE, IA: NEGATIVE ng/mL
Methadone, IA: NEGATIVE ng/mL
Opiates, IA: NEGATIVE ng/mL
Oxycodones, IA: NEGATIVE ng/mL
Phencyclidine, IA: NEGATIVE ng/mL
Propoxyphene, IA: NEGATIVE ng/mL
THC(Marijuana) Metabolite, IA: NEGATIVE ng/mL
TRAMADOL, IA: NEGATIVE ng/mL

## 2023-01-25 NOTE — Patient Instructions (Signed)
1) Will contact pt when pap results available 2) Cont with Dr. Lavone Orn per his instruction

## 2023-01-25 NOTE — Progress Notes (Signed)
Established Patient Office Visit  Subjective:  Patient ID: Nicole Walter, female    DOB: February 14, 1961  Age: 62 y.o. MRN: [id number]  Chief Complaint  Patient presents with   Annual Exam    Physical and pap smear    Pap screen.  Reports right sided sciatica pain.  Specifically requests HPV testing on her pap screen today.    No other concerns at this time.   Past Medical History:  Diagnosis Date   Abnormal liver enzymes 02/08/2016   Allergy    Anxiety    Chest pain 07/05/2016   Chronic pain    Depression    DJD (degenerative joint disease)    lumbar   Fibromyalgia    High cholesterol    Rash of back 01/28/2016   Weight loss due to medication     Past Surgical History:  Procedure Laterality Date   ANKLE SURGERY     x 2   BREAST BIOPSY Left 1997   CHOLECYSTECTOMY     COLONOSCOPY     COLONOSCOPY WITH PROPOFOL N/A 02/22/2016   Procedure: COLONOSCOPY WITH PROPOFOL;  Surgeon: Lollie Sails, MD;  Location[facility] ENDOSCOPY;  Service: Endoscopy;  Laterality: N/A;   ESOPHAGOGASTRODUODENOSCOPY (EGD) WITH PROPOFOL N/A 02/22/2016   Procedure: ESOPHAGOGASTRODUODENOSCOPY (EGD) WITH PROPOFOL;  Surgeon: Lollie Sails, MD;  Location: [facility] ENDOSCOPY;  Service: Endoscopy;  Laterality: N/A;   NASAL SINUS SURGERY     SHOULDER ARTHROSCOPY WITH ROTATOR CUFF REPAIR AND SUBACROMIAL DECOMPRESSION Left 09/23/2019   Procedure: SHOULDER ARTHROSCOPY WITH MINI ROTATOR CUFF REPAIR AND SUBACROMIAL DECOMPRESSION, BICEPS TENODESIS;  Surgeon: Leim Fabry, MD;  Location: [place];  Service: Orthopedics;  Laterality: Left;   TONSILLECTOMY     TUBAL LIGATION      Social History   Socioeconomic History   Marital status: Legally Separated    Spouse name: Not on file   Number of children: Not on file   Years of education: Not on file   Highest education level: Not on file  Occupational History   Not on file  Tobacco Use   Smoking status: Every Day    Packs/day: 1.00    Years: 35.00     Additional pack years: 0.00    Total pack years: 35.00    Types: E-cigarettes, Cigarettes    Start date: 03/06/1974    Last attempt to quit: 07/08/2015    Years since quitting: 7.5   Smokeless tobacco: Never   Tobacco comments:    vapor cigarettes, no nicotene  Vaping Use   Vaping Use: Every day   Start date: 07/08/2015   Substances: Flavoring   Devices: Nort, Geek  Substance and Sexual Activity   Alcohol use: No    Alcohol/week: 0.0 standard drinks of alcohol   Drug use: No   Sexual activity: Not Currently    Birth control/protection: Post-menopausal  Other Topics Concern   Not on file  Social History Narrative   Not on file   Social Determinants of Health   Financial Resource Strain: Not on file  Food Insecurity: Not on file  Transportation Needs: Not on file  Physical Activity: Not on file  Stress: Not on file  Social Connections: Not on file  Intimate Partner Violence: Not on file    Family History  Problem Relation Age of Onset   Heart disease Mother    Stroke Mother    Cancer Father        lung   Arthritis Father    Arthritis  Paternal Grandmother    Arthritis Paternal Grandfather    Drug abuse Sister    Drug abuse Brother    Post-traumatic stress disorder Brother    Diabetes Sister    Dementia Sister    Drug abuse Brother    Cancer Brother     Allergies  Allergen Reactions   Other Hives and Other (See Comments)    Pt states she was tested for allergies and peas was one that she is allergic to but has never ate enough of them to see a reaction. Pt states when she is around bird feathers she gets the hives FEATHERS   Adhesive [Tape] Hives   Food     PEAS/ patient does not know what type reaction she has/ from allergy test during childhood   Pea Other (See Comments)    Other reaction(s): Unknown.  Pt states she was tested for allergies and peas was one that she is allergic to but has never ate enough of them to see a reaction.    Review of Systems   Constitutional: Negative.   HENT: Negative.    Eyes: Negative.   Respiratory: Negative.    Cardiovascular: Negative.   Gastrointestinal: Negative.   Genitourinary:  Positive for dysuria and urgency.  Musculoskeletal:  Positive for back pain and myalgias.  Skin: Negative.   Neurological: Negative.   Endo/Heme/Allergies: Negative.   Psychiatric/Behavioral:  Positive for depression. The patient is nervous/anxious.        Objective:   BP 122/78   Pulse 74   Ht 5\' 4"  (1.626 m)   Wt 169 lb 9.6 oz (76.9 kg)   SpO2 96%   BMI 29.11 kg/m   Vitals:   01/25/23 1356  BP: 122/78  Pulse: 74  Height: 5\' 4"  (1.626 m)  Weight: 169 lb 9.6 oz (76.9 kg)  SpO2: 96%  BMI (Calculated): 29.1    Physical Exam Vitals reviewed.  Constitutional:      Appearance: Normal appearance.  HENT:     Head: Normocephalic.     Nose: Nose normal.     Mouth/Throat:     Mouth: Mucous membranes are moist.  Eyes:     Pupils: Pupils are equal, round, and reactive to light.  Cardiovascular:     Rate and Rhythm: Normal rate and regular rhythm.  Pulmonary:     Effort: Pulmonary effort is normal.     Breath sounds: Normal breath sounds.  Abdominal:     General: Bowel sounds are normal.     Palpations: Abdomen is soft.  Genitourinary:    General: Normal vulva.  Musculoskeletal:     Cervical back: Normal range of motion and neck supple.     Right lower leg: Edema present.  Skin:    General: Skin is warm and dry.  Neurological:     Mental Status: She is alert and oriented to person, place, and time.  Psychiatric:        Mood and Affect: Mood normal.        Behavior: Behavior normal.      No results found for any visits on 01/25/23.  Recent Results (from the past 2160 hour(s))  Resp panel by RT-PCR (RSV, Flu A&B, Covid) Anterior Nasal Swab     Status: None   Collection Time: 11/07/22  2:35 PM   Specimen: Anterior Nasal Swab  Result Value Ref Range   SARS Coronavirus 2 by RT PCR NEGATIVE  NEGATIVE    Comment: (NOTE) SARS-CoV-2 target nucleic acids are NOT  DETECTED.  The SARS-CoV-2 RNA is generally detectable in upper respiratory specimens during the acute phase of infection. The lowest concentration of SARS-CoV-2 viral copies this assay can detect is 138 copies/mL. A negative result does not preclude SARS-Cov-2 infection and should not be used as the sole basis for treatment or other patient management decisions. A negative result may occur with  improper specimen collection/handling, submission of specimen other than nasopharyngeal swab, presence of viral mutation(s) within the areas targeted by this assay, and inadequate number of viral copies(<138 copies/mL). A negative result must be combined with clinical observations, patient history, and epidemiological information. The expected result is Negative.  Fact Sheet for Patients:  EntrepreneurPulse.com.au  Fact Sheet for Healthcare Providers:  IncredibleEmployment.be  This test is no t yet approved or cleared by the [place] FDA and  has been authorized for detection and/or diagnosis of SARS-CoV-2 by FDA under an Emergency Use Authorization (EUA). This EUA will remain  in effect (meaning this test can be used) for the duration of the COVID-19 declaration under Section 564(b)(1) of the Act, 21 U.S.C.section 360bbb-3(b)(1), unless the authorization is terminated  or revoked sooner.       Influenza A by PCR NEGATIVE NEGATIVE   Influenza B by PCR NEGATIVE NEGATIVE    Comment: (NOTE) The Xpert Xpress SARS-CoV-2/FLU/RSV plus assay is intended as an aid in the diagnosis of influenza from Nasopharyngeal swab specimens and should not be used as a sole basis for treatment. Nasal washings and aspirates are unacceptable for Xpert Xpress SARS-CoV-2/FLU/RSV testing.  Fact Sheet for Patients: EntrepreneurPulse.com.au  Fact Sheet for Healthcare  Providers: IncredibleEmployment.be  This test is not yet approved or cleared by the [place] FDA and has been authorized for detection and/or diagnosis of SARS-CoV-2 by FDA under an Emergency Use Authorization (EUA). This EUA will remain in effect (meaning this test can be used) for the duration of the COVID-19 declaration under Section 564(b)(1) of the Act, 21 U.S.C. section 360bbb-3(b)(1), unless the authorization is terminated or revoked.     Resp Syncytial Virus by PCR NEGATIVE NEGATIVE    Comment: (NOTE) Fact Sheet for Patients: EntrepreneurPulse.com.au  Fact Sheet for Healthcare Providers: IncredibleEmployment.be  This test is not yet approved or cleared by the [place] FDA and has been authorized for detection and/or diagnosis of SARS-CoV-2 by FDA under an Emergency Use Authorization (EUA). This EUA will remain in effect (meaning this test can be used) for the duration of the COVID-19 declaration under Section 564(b)(1) of the Act, 21 U.S.C. section 360bbb-3(b)(1), unless the authorization is terminated or revoked.  Performed at [facility], [address] [place], [place] [address]   CMP14+EGFR     Status: None   Collection Time: 12/25/22  8:46 AM  Result Value Ref Range   Glucose 81 70 - 99 mg/dL   BUN 17 8 - 27 mg/dL   Creatinine, Ser 0.79 0.57 - 1.00 mg/dL   eGFR 86 >59 mL/min/1.73   BUN/Creatinine Ratio 22 12 - 28   Sodium 144 134 - 144 mmol/L   Potassium 3.7 3.5 - 5.2 mmol/L   Chloride 104 96 - 106 mmol/L   CO2 25 20 - 29 mmol/L   Calcium 9.9 8.7 - 10.3 mg/dL   Total Protein 6.9 6.0 - 8.5 g/dL   Albumin 4.7 3.8 - 4.9 g/dL   Globulin, Total 2.2 1.5 - 4.5 g/dL   Albumin/Globulin Ratio 2.1 1.2 - 2.2   Bilirubin Total 0.5 0.0 - 1.2 mg/dL  Alkaline Phosphatase 83 44 - 121 IU/L   AST 17 0 - 40 IU/L   ALT 24 0 - 32 IU/L  CBC With Differential     Status: Abnormal   Collection Time:  12/25/22  8:46 AM  Result Value Ref Range   WBC 9.9 3.4 - 10.8 x10E3/uL   RBC 4.42 3.77 - 5.28 x10E6/uL   Hemoglobin 13.3 11.1 - 15.9 g/dL   Hematocrit 39.5 34.0 - 46.6 %   MCV 89 79 - 97 fL   MCH 30.1 26.6 - 33.0 pg   MCHC 33.7 31.5 - 35.7 g/dL   RDW 13.5 11.7 - 15.4 %   Neutrophils 51 Not Estab. %   Lymphs 39 Not Estab. %   Monocytes 8 Not Estab. %   Eos 1 Not Estab. %   Basos 1 Not Estab. %   Neutrophils Absolute 5.1 1.4 - 7.0 x10E3/uL   Lymphocytes Absolute 3.9 (H) 0.7 - 3.1 x10E3/uL   Monocytes Absolute 0.8 0.1 - 0.9 x10E3/uL   EOS (ABSOLUTE) 0.1 0.0 - 0.4 x10E3/uL   Basophils Absolute 0.1 0.0 - 0.2 x10E3/uL   Immature Granulocytes 0 Not Estab. %   Immature Grans (Abs) 0.0 0.0 - 0.1 x10E3/uL  Lipid panel     Status: Abnormal   Collection Time: 12/25/22  8:46 AM  Result Value Ref Range   Cholesterol, Total 224 (H) 100 - 199 mg/dL   Triglycerides 80 0 - 149 mg/dL   HDL 68 >39 mg/dL   VLDL Cholesterol Cal 14 5 - 40 mg/dL   LDL Chol Calc (NIH) 142 (H) 0 - 99 mg/dL   Chol/HDL Ratio 3.3 0.0 - 4.4 ratio    Comment:                                   T. Chol/HDL Ratio                                             Men  Women                               1/2 Avg.Risk  3.4    3.3                                   Avg.Risk  5.0    4.4                                2X Avg.Risk  9.6    7.1                                3X Avg.Risk 23.4   11.0   Hemoglobin A1c     Status: None   Collection Time: 12/25/22  8:46 AM  Result Value Ref Range   Hgb A1c MFr Bld 5.5 4.8 - 5.6 %    Comment:          Prediabetes: 5.7 - 6.4          Diabetes: >6.4  Glycemic control for adults with diabetes: <7.0    Est. average glucose Bld gHb Est-mCnc 111 mg/dL  TSH     Status: None   Collection Time: 12/25/22  8:46 AM  Result Value Ref Range   TSH 1.350 0.450 - 4.500 uIU/mL      Assessment & Plan:   Problem List Items Addressed This Visit       Genitourinary   Acute vaginitis    Relevant Orders   IGP,Aptima HPV Age Gdln,CtNgTv     Other   Special screening for malignant neoplasms, vagina - Primary   Relevant Orders   IGP,Aptima HPV Age Gdln,CtNgTv   Exposure to sexually transmitted disease (STD)   Relevant Orders   IGP,Aptima HPV Age Gdln,CtNgTv   Abnormal cervical Papanicolaou smear   Relevant Orders   IGP,Aptima HPV Age Gdln,CtNgTv    No follow-ups on file.   Total time spent: 35 minutes  Evern Bio, NP  01/25/2023

## 2023-01-29 ENCOUNTER — Other Ambulatory Visit: Payer: Self-pay | Admitting: Internal Medicine

## 2023-01-30 LAB — IGP,CTNGTV,APT HPV,RFX16/18,45
Chlamydia, Nuc. Acid Amp: NEGATIVE
Gonococcus, Nuc. Acid Amp: NEGATIVE
HPV Aptima: NEGATIVE
PAP Smear Comment: 0
Trich vag by NAA: NEGATIVE

## 2023-01-30 LAB — IGP,APTIMA HPV AGE GDLN,CTNGTV

## 2023-01-31 ENCOUNTER — Ambulatory Visit: Payer: Medicaid Other | Admitting: Internal Medicine

## 2023-01-31 ENCOUNTER — Encounter: Payer: Self-pay | Admitting: Internal Medicine

## 2023-01-31 VITALS — BP 130/80 | HR 68 | Temp 97.4°F | Ht 64.0 in | Wt 164.6 lb

## 2023-01-31 DIAGNOSIS — G894 Chronic pain syndrome: Secondary | ICD-10-CM

## 2023-01-31 MED ORDER — TIZANIDINE HCL 4 MG PO TABS: 4.0000 mg | ORAL_TABLET | Freq: Two times a day (BID) | ORAL | 3 refills | Status: DC

## 2023-01-31 MED ORDER — OXYCODONE HCL 5 MG PO TABS: 5.0000 mg | ORAL_TABLET | Freq: Four times a day (QID) | ORAL | 0 refills | Status: AC | PRN

## 2023-01-31 NOTE — Progress Notes (Signed)
Established Patient Office Visit  Subjective:  Patient ID: Nicole Walter, female    DOB: Dec 12, 1960  Age: 62 y.o. MRN: [id number]  No chief complaint on file.   Here for pain management follow up. Chronic pain well controlled on current analgesia. Last UDS satisfactory and pill counts have also been satisfactory. Now c/o sciatic pain in her right leg and is awaiting an appt at the pain clinic.    No other concerns at this time.   Past Medical History:  Diagnosis Date   Abnormal liver enzymes 02/08/2016   Allergy    Anxiety    Chest pain 07/05/2016   Chronic pain    Depression    DJD (degenerative joint disease)    lumbar   Fibromyalgia    High cholesterol    Rash of back 01/28/2016   Weight loss due to medication     Past Surgical History:  Procedure Laterality Date   ANKLE SURGERY     x 2   BREAST BIOPSY Left 1997   CHOLECYSTECTOMY     COLONOSCOPY     COLONOSCOPY WITH PROPOFOL N/A 02/22/2016   Procedure: COLONOSCOPY WITH PROPOFOL;  Surgeon: Lollie Sails, MD;  Location: [facility] ENDOSCOPY;  Service: Endoscopy;  Laterality: N/A;   ESOPHAGOGASTRODUODENOSCOPY (EGD) WITH PROPOFOL N/A 02/22/2016   Procedure: ESOPHAGOGASTRODUODENOSCOPY (EGD) WITH PROPOFOL;  Surgeon: Lollie Sails, MD;  Location: [facility] ENDOSCOPY;  Service: Endoscopy;  Laterality: N/A;   NASAL SINUS SURGERY     SHOULDER ARTHROSCOPY WITH ROTATOR CUFF REPAIR AND SUBACROMIAL DECOMPRESSION Left 09/23/2019   Procedure: SHOULDER ARTHROSCOPY WITH MINI ROTATOR CUFF REPAIR AND SUBACROMIAL DECOMPRESSION, BICEPS TENODESIS;  Surgeon: Leim Fabry, MD;  Location: [facility];  Service: Orthopedics;  Laterality: Left;   TONSILLECTOMY     TUBAL LIGATION      Social History   Socioeconomic History   Marital status: Legally Separated    Spouse name: Not on file   Number of children: Not on file   Years of education: Not on file   Highest education level: Not on file  Occupational History   Not on file   Tobacco Use   Smoking status: Every Day    Packs/day: 1.00    Years: 35.00    Additional pack years: 0.00    Total pack years: 35.00    Types: E-cigarettes, Cigarettes    Start date: 03/06/1974    Last attempt to quit: 07/08/2015    Years since quitting: 7.5   Smokeless tobacco: Never   Tobacco comments:    vapor cigarettes, no nicotene  Vaping Use   Vaping Use: Every day   Start date: 07/08/2015   Substances: Flavoring   Devices: [employer]  Substance and Sexual Activity   Alcohol use: No    Alcohol/week: 0.0 standard drinks of alcohol   Drug use: No   Sexual activity: Not Currently    Birth control/protection: Post-menopausal  Other Topics Concern   Not on file  Social History Narrative   Not on file   Social Determinants of Health   Financial Resource Strain: Not on file  Food Insecurity: Not on file  Transportation Needs: Not on file  Physical Activity: Not on file  Stress: Not on file  Social Connections: Not on file  Intimate Partner Violence: Not on file    Family History  Problem Relation Age of Onset   Heart disease Mother    Stroke Mother    Cancer Father  lung   Arthritis Father    Arthritis Paternal Grandmother    Arthritis Paternal Grandfather    Drug abuse Sister    Drug abuse Brother    Post-traumatic stress disorder Brother    Diabetes Sister    Dementia Sister    Drug abuse Brother    Cancer Brother     Allergies  Allergen Reactions   Other Hives and Other (See Comments)    Pt states she was tested for allergies and peas was one that she is allergic to but has never ate enough of them to see a reaction. Pt states when she is around bird feathers she gets the hives FEATHERS   Adhesive [Tape] Hives   Food     PEAS/ patient does not know what type reaction she has/ from allergy test during childhood   Pea Other (See Comments)    Other reaction(Mariaceleste Herrera): Unknown.  Pt states she was tested for allergies and peas was one that she is allergic  to but has never ate enough of them to see a reaction.    Review of Systems  Constitutional: Negative.   HENT: Negative.    Eyes: Negative.   Respiratory: Negative.    Cardiovascular: Negative.   Gastrointestinal: Negative.   Genitourinary: Negative.        Urge incontinence  Musculoskeletal:        As in hpi  Skin: Negative.   Neurological: Negative.   Endo/Heme/Allergies: Negative.        Objective:   BP 130/80   Pulse 68   Temp (!) 97.4 F (36.3 C) (Tympanic)   Ht 5\' 4"  (1.626 m)   Wt 164 lb 9.6 oz (74.7 kg)   SpO2 99%   BMI 28.25 kg/m   Vitals:   01/31/23 0838  BP: 130/80  Pulse: 68  Temp: (!) 97.4 F (36.3 C)  Height: 5\' 4"  (1.626 m)  Weight: 164 lb 9.6 oz (74.7 kg)  SpO2: 99%  TempSrc: Tympanic  BMI (Calculated): 28.24    Physical Exam Vitals reviewed.  Constitutional:      General: She is not in acute distress. HENT:     Head: Normocephalic.     Nose: Nose normal.     Mouth/Throat:     Mouth: Mucous membranes are moist.  Eyes:     Extraocular Movements: Extraocular movements intact.     Pupils: Pupils are equal, round, and reactive to light.  Cardiovascular:     Rate and Rhythm: Normal rate and regular rhythm.     Heart sounds: No murmur heard. Pulmonary:     Effort: Pulmonary effort is normal.     Breath sounds: No rhonchi or rales.  Abdominal:     General: Abdomen is flat.     Palpations: There is no hepatomegaly, splenomegaly or mass.  Musculoskeletal:        General: Normal range of motion.     Cervical back: Normal range of motion. No tenderness.  Skin:    General: Skin is warm and dry.  Neurological:     General: No focal deficit present.     Mental Status: She is alert and oriented to person, place, and time.     Cranial Nerves: No cranial nerve deficit.     Motor: No weakness.  Psychiatric:        Mood and Affect: Mood normal.        Behavior: Behavior normal.      No results found for any visits on  01/31/23.       Assessment & Plan:   Problem List Items Addressed This Visit       Other   Chronic pain syndrome - Primary (Chronic)   Relevant Medications   oxyCODONE (ROXICODONE) 5 MG immediate release tablet   tiZANidine (ZANAFLEX) 4 MG tablet    No follow-ups on file.   Total time spent: 20 minutes  Volanda Napoleon, MD  01/31/2023

## 2023-03-20 ENCOUNTER — Encounter: Payer: Self-pay | Admitting: Student in an Organized Health Care Education/Training Program

## 2023-03-20 ENCOUNTER — Ambulatory Visit
Payer: Medicaid Other | Attending: Student in an Organized Health Care Education/Training Program | Admitting: Student in an Organized Health Care Education/Training Program

## 2023-03-20 VITALS — BP 137/77 | HR 67 | Temp 97.2°F | Resp 18 | Ht 64.0 in | Wt 164.0 lb

## 2023-03-20 DIAGNOSIS — M5416 Radiculopathy, lumbar region: Secondary | ICD-10-CM | POA: Insufficient documentation

## 2023-03-20 DIAGNOSIS — G8929 Other chronic pain: Secondary | ICD-10-CM | POA: Diagnosis present

## 2023-03-20 DIAGNOSIS — G894 Chronic pain syndrome: Secondary | ICD-10-CM | POA: Diagnosis present

## 2023-03-20 DIAGNOSIS — M5412 Radiculopathy, cervical region: Secondary | ICD-10-CM

## 2023-03-20 DIAGNOSIS — M5414 Radiculopathy, thoracic region: Secondary | ICD-10-CM | POA: Diagnosis present

## 2023-03-20 DIAGNOSIS — M5134 Other intervertebral disc degeneration, thoracic region: Secondary | ICD-10-CM | POA: Diagnosis present

## 2023-03-20 DIAGNOSIS — M542 Cervicalgia: Secondary | ICD-10-CM | POA: Diagnosis present

## 2023-03-20 NOTE — Progress Notes (Signed)
Safety precautions to be maintained throughout the outpatient stay will include: orient to surroundings, keep bed in low position, maintain call bell within reach at all times, provide assistance with transfer out of bed and ambulation.  

## 2023-03-20 NOTE — Progress Notes (Signed)
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                         ROS  Cardiovascular: No reported cardiovascular signs or symptoms such as High blood pressure, coronary artery disease, abnormal heart rate or rhythm, heart attack, blood thinner therapy or heart weakness and/or failure Pulmonary or Respiratory: No reported pulmonary signs or symptoms such as wheezing and difficulty taking a deep full breath (Asthma), difficulty blowing air out (Emphysema), coughing up mucus (Bronchitis), persistent dry cough, or temporary stoppage of breathing during sleep Neurological: No reported neurological signs or symptoms such as seizures, abnormal skin sensations, urinary and/or fecal incontinence, being born with an abnormal open spine and/or a tethered spinal  cord Psychological-Psychiatric: Psychiatric disorder, Anxiousness, and Depressed Gastrointestinal: Irregular, infrequent bowel movements (Constipation) Genitourinary: No reported renal or genitourinary signs or symptoms such as difficulty voiding or producing urine, peeing blood, non-functioning kidney, kidney stones, difficulty emptying the bladder, difficulty controlling the flow of urine, or chronic kidney disease Hematological: No reported hematological signs or symptoms such as prolonged bleeding, low or poor functioning platelets, bruising or bleeding easily, hereditary bleeding problems, low energy levels due to low hemoglobin or being anemic Endocrine: No reported endocrine signs or symptoms such as high or low blood sugar, rapid heart rate due to high thyroid levels, obesity or weight gain due to slow thyroid or thyroid disease Rheumatologic: Joint aches and or swelling due to excess weight (Osteoarthritis) and Generalized muscle aches (Fibromyalgia) Musculoskeletal: Negative for myasthenia gravis, muscular dystrophy, multiple sclerosis or malignant hyperthermia Work History: Quit going to work on his/her own  Allergies  Ms. Vorndran is allergic to other, adhesive [tape], food, and pea.  Laboratory Chemistry Profile   Renal Lab Results  Component Value Date   BUN 17 12/25/2022   CREATININE 0.79 12/25/2022   BCR 22 12/25/2022   GFR 86.13 10/08/2018   GFRAA >60 12/07/2018   GFRNONAA >60 12/07/2018   SPECGRAV 1.010 10/22/2018   PHUR 7.5 10/22/2018   PROTEINUR NEGATIVE 12/07/2018     Electrolytes Lab Results  Component Value Date   NA 144 12/25/2022   K 3.7 12/25/2022   CL 104 12/25/2022   CALCIUM 9.9 12/25/2022   MG 2.2 05/15/2017     Hepatic Lab Results  Component Value Date   AST 17 12/25/2022   ALT 24 12/25/2022   ALBUMIN 4.7 12/25/2022   ALKPHOS 83 12/25/2022   LIPASE 29 12/07/2018     ID Lab Results  Component Value Date   SARSCOV2NAA NEGATIVE 11/07/2022    HCVAB NEGATIVE 02/14/2017     Bone Lab Results  Component Value Date   VD25OH 51.75 10/11/2018   25OHVITD1 58 07/20/2016   25OHVITD2 <1.0 07/20/2016   25OHVITD3 58 07/20/2016     Endocrine Lab Results  Component Value Date   GLUCOSE 81 12/25/2022   GLUCOSEU NEGATIVE 12/07/2018   HGBA1C 5.5 12/25/2022  TSH 1.350 12/25/2022     Neuropathy Lab Results  Component Value Date   VITAMINB12 987 (H) 07/20/2016   HGBA1C 5.5 12/25/2022     CNS No results found for: "COLORCSF", "APPEARCSF", "RBCCOUNTCSF", "WBCCSF", "POLYSCSF", "LYMPHSCSF", "EOSCSF", "PROTEINCSF", "GLUCCSF", "JCVIRUS", "CSFOLI", "IGGCSF", "LABACHR", "ACETBL"   Inflammation (CRP: Acute  ESR: Chronic) Lab Results  Component Value Date   CRP 2.1 (H) 07/20/2016   ESRSEDRATE 2 05/15/2017     Rheumatology Lab Results  Component Value Date   RF <10.0 08/31/2016     Coagulation Lab Results  Component Value Date   PLT 336 12/07/2018     Cardiovascular Lab Results  Component Value Date   TROPONINI <0.03 09/05/2018   HGB 13.3 12/25/2022   HCT 39.5 12/25/2022     Screening Lab Results  Component Value Date   SARSCOV2NAA NEGATIVE 11/07/2022   HCVAB NEGATIVE 02/14/2017     Cancer No results found for: "CEA", "CA125", "LABCA2"   Allergens No results found for: "ALMOND", "APPLE", "ASPARAGUS", "AVOCADO", "BANANA", "BARLEY", "BASIL", "BAYLEAF", "GREENBEAN", "LIMABEAN", "WHITEBEAN", "BEEFIGE", "REDBEET", "BLUEBERRY", "BROCCOLI", "CABBAGE", "MELON", "CARROT", "CASEIN", "CASHEWNUT", "CAULIFLOWER", "CELERY"     Note: Lab results reviewed.  PFSH  Drug: Ms. Nalley  reports no history of drug use. Alcohol:  reports no history of alcohol use. Tobacco:  reports that she has been smoking e-cigarettes and cigarettes. She started smoking about 49 years ago. She has a 35.00 pack-year smoking history. She has never used smokeless tobacco. Medical:  has a past medical history of Abnormal liver enzymes (02/08/2016), Allergy,  Anxiety, Chest pain (07/05/2016), Chronic pain, Depression, DJD (degenerative joint disease), Fibromyalgia, High cholesterol, Rash of back (01/28/2016), and Weight loss due to medication. Family: family history includes Arthritis in her father, paternal grandfather, and paternal grandmother; Cancer in her brother and father; Dementia in her sister; Diabetes in her sister; Drug abuse in her brother, brother, and sister; Heart disease in her mother; Post-traumatic stress disorder in her brother; Stroke in her mother.  Past Surgical History:  Procedure Laterality Date   ANKLE SURGERY     x 2   BREAST BIOPSY Left 1997   CHOLECYSTECTOMY     COLONOSCOPY     COLONOSCOPY WITH PROPOFOL N/A 02/22/2016   Procedure: COLONOSCOPY WITH PROPOFOL;  Surgeon: Christena Deem, MD;  Location: [facility] ENDOSCOPY;  Service: Endoscopy;  Laterality: N/A;   ESOPHAGOGASTRODUODENOSCOPY (EGD) WITH PROPOFOL N/A 02/22/2016   Procedure: ESOPHAGOGASTRODUODENOSCOPY (EGD) WITH PROPOFOL;  Surgeon: Christena Deem, MD;  Location: [facility] ENDOSCOPY;  Service: Endoscopy;  Laterality: N/A;   NASAL SINUS SURGERY     SHOULDER ARTHROSCOPY WITH ROTATOR CUFF REPAIR AND SUBACROMIAL DECOMPRESSION Left 09/23/2019   Procedure: SHOULDER ARTHROSCOPY WITH MINI ROTATOR CUFF REPAIR AND SUBACROMIAL DECOMPRESSION, BICEPS TENODESIS;  Surgeon: Signa Kell, MD;  Location: [facility];  Service: Orthopedics;  Laterality: Left;   TONSILLECTOMY     TUBAL LIGATION     Active Ambulatory Problems    Diagnosis Date Noted   Anxiety and depression 07/11/2015   Chronic pain syndrome 07/11/2015   Lumbar spondylosis 01/06/2016   Combined fat and carbohydrate induced hyperlipemia 07/11/2015   Special screening for malignant neoplasms, vagina 01/14/2016   Chronic neck pain (Primary Area of Pain) (Bilateral) (L>R) 01/28/2016   Concentration deficit 01/28/2016   Hyperlipidemia 02/18/2016   Cervical spondylosis (Bilateral) 02/08/2016   Fibromyalgia  02/08/2016   Osteopenia 02/08/2016   Skin lesions 04/27/2016   Chronic hip pain (Bilateral) (R>L) 05/31/2016   Bursitis of shoulder (Right) 05/17/2016  Chronic Greater trochanteric bursitis (Bilateral) (L>R) 05/31/2016   Long term current use of opiate analgesic 07/20/2016   Long term prescription opiate use 07/20/2016   Opiate use 07/20/2016   Chronic upper back pain (Secondary Area of Pain) (Bilateral) (L>R) 07/20/2016   Chronic shoulder pain (Tertiary Area of Pain) (Bilateral) (L>R) 07/20/2016   Chronic knee pain (Right) 07/20/2016   Chronic upper extremity pain (Left) 07/20/2016   Chronic lower extremity pain (Bilateral) (L>R) 07/20/2016   Chronic abdominal pain 07/20/2016   Elevated sedimentation rate 08/31/2016   Elevated C-reactive protein (CRP) 08/31/2016   Overweight (BMI 25.0-29.9) 09/14/2016   Greater trochanteric bursitis (Right) 09/18/2016   Subacromial bursitis of shoulder joint (Right) 09/18/2016   Osteoarthritis of hip (Bilateral) 11/20/2016   Radicular pain of thoracic region 12/21/2016   Thoracic spondylosis 01/01/2017   Shortness of breath 01/15/2017   Osteoarthritis of shoulder (Right) 01/31/2017   Osteoarthritis of knee (Right) 02/21/2017   Chronic knee arthropathy (Right) 03/14/2017   Chronic hip pain (Right) 03/19/2017   Muscle spasm 05/15/2017   Myofascial pain syndrome, cervical (trapezius) (Left) 05/28/2017   Cervical facet syndrome (Bilateral) (L>R) 05/28/2017   Constipation 07/16/2017   Chronic low back pain (Bilateral) (L>R) 08/13/2017   Cervical Foraminal Stenosis (Severe) (Bilateral: C5-6) 08/13/2017   Cervical spondylosis with radiculopathy (Bilateral) (L>R) 08/13/2017   Cervical sensory radiculopathy at C5 (Bilateral) 08/13/2017   Cervical radicular pain 08/13/2017   Chronic pelvic pain in female 08/24/2017   Fatty liver 08/24/2017   Spondylosis without myelopathy or radiculopathy, cervical region 02/11/2018   Cervicalgia (Bilateral) (L>R)  03/28/2018   Chronic shoulder pain (Left) 07/09/2018   Cervical facet hypertrophy (Bilateral) 07/18/2018   Cervical central spinal stenosis 07/18/2018   Generalized anxiety disorder 07/23/2018   Spondylosis without myelopathy or radiculopathy, lumbar region 08/29/2018   Lumbar facet hypertrophy 08/29/2018   Lumbar facet syndrome (Right) 08/29/2018   DDD (degenerative disc disease), thoracic 08/29/2018   Thoracic spondylosis with radiculopathy (Right) 08/29/2018   Spinal enthesopathy of lumbar region (HCC) 08/29/2018   Chronic midline low back pain without sciatica 08/29/2018   Trypanophobia (Needle phobia) 09/26/2018   Chronic musculoskeletal pain 05/14/2019   DDD (degenerative disc disease), cervical 06/09/2019   Abnormal MRI, cervical spine (09/04/2017) 07/22/2019   Neurogenic pain 07/22/2019   Exposure to sexually transmitted disease (STD) 01/25/2023   Acute vaginitis 01/25/2023   Abnormal cervical Papanicolaou smear 01/25/2023   Chronic radicular lumbar pain 03/20/2023   Resolved Ambulatory Problems    Diagnosis Date Noted   Generalized abdominal tenderness 01/14/2016   Rash of back 01/28/2016   Major depressive disorder, recurrent episode, moderate (HCC) 02/14/2016   Abnormal liver enzymes 02/08/2016   Chest pain 07/05/2016   Laceration of hand 02/12/2018   URI (upper respiratory infection) 10/07/2018   Past Medical History:  Diagnosis Date   Allergy    Anxiety    Chronic pain    Depression    DJD (degenerative joint disease)    High cholesterol    Weight loss due to medication    Constitutional Exam  General appearance: Well nourished, well developed, and well hydrated. In no apparent acute distress Vitals:   03/20/23 1258  BP: 137/77  Pulse: 67  Resp: 18  Temp: (!) 97.2 F (36.2 C)  TempSrc: Temporal  SpO2: 100%  Weight: 164 lb (74.4 kg)  Height: 5\' 4"  (1.626 m)   BMI Assessment: Estimated body mass index is 28.15 kg/m as calculated from the  following:   Height as of  this encounter: 5\' 4"  (1.626 m).   Weight as of this encounter: 164 lb (74.4 kg).  BMI interpretation table: BMI level Category Range association with higher incidence of chronic pain  <18 kg/m2 Underweight   18.5-24.9 kg/m2 Ideal body weight   25-29.9 kg/m2 Overweight Increased incidence by 20%  30-34.9 kg/m2 Obese (Class I) Increased incidence by 68%  35-39.9 kg/m2 Severe obesity (Class II) Increased incidence by 136%  >40 kg/m2 Extreme obesity (Class III) Increased incidence by 254%   Patient's current BMI Ideal Body weight  Body mass index is 28.15 kg/m. Ideal body weight: 54.7 kg (120 lb 9.5 oz) Adjusted ideal body weight: 62.6 kg (137 lb 15.3 oz)   BMI Readings from Last 4 Encounters:  03/20/23 28.15 kg/m  01/31/23 28.25 kg/m  01/25/23 29.11 kg/m  01/15/23 32.08 kg/m   Wt Readings from Last 4 Encounters:  03/20/23 164 lb (74.4 kg)  01/31/23 164 lb 9.6 oz (74.7 kg)  01/25/23 169 lb 9.6 oz (76.9 kg)  01/15/23 169 lb 12.8 oz (77 kg)    Psych/Mental status: Alert, oriented x 3 (person, place, & time)       Eyes: PERLA Respiratory: No evidence of acute respiratory distress  Cervical Spine Area Exam  Skin & Axial Inspection: No masses, redness, edema, swelling, or associated skin lesions Alignment: Symmetrical Functional ROM: Pain restricted ROM, bilaterally Stability: No instability detected Muscle Tone/Strength: Functionally intact. No obvious neuro-muscular anomalies detected. Sensory (Neurological): Dermatomal pain pattern Palpation: No palpable anomalies             Upper Extremity (UE) Exam    Side: Right upper extremity  Side: Left upper extremity  Skin & Extremity Inspection: Skin color, temperature, and hair growth are WNL. No peripheral edema or cyanosis. No masses, redness, swelling, asymmetry, or associated skin lesions. No contractures.  Skin & Extremity Inspection: Skin color, temperature, and hair growth are WNL. No peripheral  edema or cyanosis. No masses, redness, swelling, asymmetry, or associated skin lesions. No contractures.  Functional ROM: Pain restricted ROM for all joints of upper extremity  Functional ROM: Pain restricted ROM for all joints of upper extremity  Muscle Tone/Strength: Functionally intact. No obvious neuro-muscular anomalies detected.  Muscle Tone/Strength: Functionally intact. No obvious neuro-muscular anomalies detected.  Sensory (Neurological): Dermatomal pain pattern          Sensory (Neurological): Dermatomal pain pattern          Palpation: No palpable anomalies              Palpation: No palpable anomalies              Provocative Test(s):  Phalen's test: deferred Tinel's test: deferred Apley's scratch test (touch opposite shoulder):  Action 1 (Across chest): Decreased ROM Action 2 (Overhead): Decreased ROM Action 3 (LB reach): Decreased ROM   Provocative Test(s):  Phalen's test: deferred Tinel's test: deferred Apley's scratch test (touch opposite shoulder):  Action 1 (Across chest): Decreased ROM Action 2 (Overhead): Decreased ROM Action 3 (LB reach): Decreased ROM    Thoracic Spine Area Exam  Skin & Axial Inspection: No masses, redness, or swelling Alignment: Symmetrical Functional ROM: Pain restricted ROM Stability: No instability detected Muscle Tone/Strength: Functionally intact. No obvious neuro-muscular anomalies detected. Sensory (Neurological): Neurogenic pain pattern Muscle strength & Tone: No palpable anomalies Lumbar Spine Area Exam  Skin & Axial Inspection: No masses, redness, or swelling Alignment: Symmetrical Functional ROM: Pain restricted ROM affecting both sides Stability: No instability detected Muscle Tone/Strength: Functionally intact.  No obvious neuro-muscular anomalies detected. Sensory (Neurological): Dermatomal pain pattern Palpation: No palpable anomalies       Provocative Tests: Hyperextension/rotation test: (+) bilaterally for facet joint  pain. Lumbar quadrant test (Kemp's test): (+) bilateral for foraminal stenosis Lateral bending test: (+) due to pain.  Gait & Posture Assessment  Ambulation: Unassisted Gait: Relatively normal for age and body habitus Posture: WNL  Lower Extremity Exam    Side: Right lower extremity  Side: Left lower extremity  Stability: No instability observed          Stability: No instability observed          Skin & Extremity Inspection: Skin color, temperature, and hair growth are WNL. No peripheral edema or cyanosis. No masses, redness, swelling, asymmetry, or associated skin lesions. No contractures.  Skin & Extremity Inspection: Skin color, temperature, and hair growth are WNL. No peripheral edema or cyanosis. No masses, redness, swelling, asymmetry, or associated skin lesions. No contractures.  Functional ROM: Unrestricted ROM                  Functional ROM: Unrestricted ROM                  Muscle Tone/Strength: Functionally intact. No obvious neuro-muscular anomalies detected.  Muscle Tone/Strength: Functionally intact. No obvious neuro-muscular anomalies detected.  Sensory (Neurological): Unimpaired        Sensory (Neurological): Unimpaired        DTR: Patellar: deferred today Achilles: deferred today Plantar: deferred today  DTR: Patellar: deferred today Achilles: deferred today Plantar: deferred today  Palpation: No palpable anomalies  Palpation: No palpable anomalies    Assessment  Primary Diagnosis & Pertinent Problem List: The primary encounter diagnosis was Cervical radicular pain. Diagnoses of Radicular pain of thoracic region, Chronic radicular lumbar pain, Chronic pain syndrome, Cervicalgia, and Other intervertebral disc degeneration, thoracic region were also pertinent to this visit.  Visit Diagnosis (New problems to examiner): 1. Cervical radicular pain   2. Radicular pain of thoracic region   3. Chronic radicular lumbar pain   4. Chronic pain syndrome   5. Cervicalgia    6. Other intervertebral disc degeneration, thoracic region    Plan of Care    1. Cervical radicular pain - MR CERVICAL SPINE WO CONTRAST; Future  2. Radicular pain of thoracic region - MR THORACIC SPINE WO CONTRAST; Future  3. Chronic radicular lumbar pain - MR LUMBAR SPINE WO CONTRAST; Future  4. Chronic pain syndrome - MR CERVICAL SPINE WO CONTRAST; Future - MR LUMBAR SPINE WO CONTRAST; Future - MR THORACIC SPINE WO CONTRAST; Future  5. Cervicalgia - MR CERVICAL SPINE WO CONTRAST; Future  6. Other intervertebral disc degeneration, thoracic region - MR THORACIC SPINE WO CONTRAST; Future  -Continue to work with PT -Will discuss treatment plan after MRI studies -Patient instructed to contact us AFTER completion of MRI studies to review and discuss interventional treatment options  Imaging Orders         MR CERVICAL SPINE WO CONTRAST         MR LUMBAR SPINE WO CONTRAST         MR THORACIC SPINE WO CONTRAST     Provider-requested follow-up: Return for patient will call to schedule F2F appt prn.  Future Appointments  Date Time Provider Department Center  03/21/2023 11:30 AM Sherron Monday, MD [facility] None  03/30/2023  2:00 PM Sherron Monday, MD [facility] None  04/03/2023  4:30 PM Mariane Masters, PT [facility] None  04/23/2023  1:30 PM Dayle Points, Shin-Yiing, PT [facility] None  05/01/2023  1:30 PM Dayle Points, Shin-Yiing, PT [facility] None  05/16/2023  1:30 PM Dayle Points, Shin-Yiing, PT [facility] None  05/21/2023  1:30 PM Dayle Points, Shin-Yiing, PT [facility] None  05/28/2023  1:30 PM Dayle Points, Shin-Yiing, PT [facility] None  06/05/2023  1:30 PM Dayle Points, Shin-Yiing, PT [facility] None  06/12/2023  1:30 PM Dayle Points, Shin-Yiing, PT [facility] None  06/19/2023  1:30 PM Yeung, Shin-Yiing, PT [facility] None    Duration of encounter: .  Total time on encounter, as per AMA guidelines included both the face-to-face and non-face-to-face time personally spent by the physician and/or other qualified health  care professional(s) on the day of the encounter (includes time in activities that require the physician or other qualified health care professional and does not include time in activities normally performed by clinical staff). Physician's time may include the following activities when performed: Preparing to see the patient (e.g., pre-charting review of records, searching for previously ordered imaging, lab work, and nerve conduction tests) Review of prior analgesic pharmacotherapies. Reviewing PMP Interpreting ordered tests (e.g., lab work, imaging, nerve conduction tests) Performing post-procedure evaluations, including interpretation of diagnostic procedures Obtaining and/or reviewing separately obtained history Performing a medically appropriate examination and/or evaluation Counseling and educating the patient/family/caregiver Ordering medications, tests, or procedures Referring and communicating with other health care professionals (when not separately reported) Documenting clinical information in the electronic or other health record Independently interpreting results (not separately reported) and communicating results to the patient/ family/caregiver Care coordination (not separately reported)  Note by: Edward Jolly, MD (TTS technology used. I apologize for any typographical errors that were not detected and corrected.) Date: 03/20/2023; Time: 2:14 PM

## 2023-03-21 ENCOUNTER — Ambulatory Visit: Payer: Medicaid Other | Admitting: Internal Medicine

## 2023-03-21 ENCOUNTER — Encounter: Payer: Self-pay | Admitting: Internal Medicine

## 2023-03-21 ENCOUNTER — Ambulatory Visit (INDEPENDENT_AMBULATORY_CARE_PROVIDER_SITE_OTHER): Payer: Medicaid Other | Admitting: Internal Medicine

## 2023-03-21 VITALS — BP 112/74 | HR 67 | Ht 64.0 in | Wt 166.0 lb

## 2023-03-21 DIAGNOSIS — M5441 Lumbago with sciatica, right side: Secondary | ICD-10-CM | POA: Diagnosis not present

## 2023-03-21 DIAGNOSIS — M5442 Lumbago with sciatica, left side: Secondary | ICD-10-CM | POA: Diagnosis not present

## 2023-03-21 DIAGNOSIS — G8929 Other chronic pain: Secondary | ICD-10-CM | POA: Diagnosis not present

## 2023-03-21 DIAGNOSIS — K5900 Constipation, unspecified: Secondary | ICD-10-CM | POA: Diagnosis not present

## 2023-03-21 MED ORDER — OXYCODONE HCL 5 MG PO TABS: 5.0000 mg | ORAL_TABLET | Freq: Four times a day (QID) | ORAL | 0 refills | Status: DC | PRN

## 2023-03-21 MED ORDER — LINZESS 72 MCG PO CAPS: ORAL_CAPSULE | ORAL | 3 refills | Status: DC

## 2023-03-21 NOTE — Progress Notes (Signed)
Established Patient Office Visit  Subjective:  Patient ID: Nicole Walter, female    DOB: 03/20/1961  Age: 62 y.o. MRN: [id number]  Chief Complaint  Patient presents with   Follow-up    Pain Management    Here for pain management follow up. Chronic pain well controlled on current analgesia. Last UDS satisfactory and pill counts have also been satisfactory.     No other concerns at this time.   Past Medical History:  Diagnosis Date   Abnormal liver enzymes 02/08/2016   Allergy    Anxiety    Chest pain 07/05/2016   Chronic pain    Depression    DJD (degenerative joint disease)    lumbar   Fibromyalgia    High cholesterol    Rash of back 01/28/2016   Weight loss due to medication     Past Surgical History:  Procedure Laterality Date   ANKLE SURGERY     x 2   BREAST BIOPSY Left 1997   CHOLECYSTECTOMY     COLONOSCOPY     COLONOSCOPY WITH PROPOFOL N/A 02/22/2016   Procedure: COLONOSCOPY WITH PROPOFOL;  Surgeon: Christena Deem, MD;  Location: [facility] ENDOSCOPY;  Service: Endoscopy;  Laterality: N/A;   ESOPHAGOGASTRODUODENOSCOPY (EGD) WITH PROPOFOL N/A 02/22/2016   Procedure: ESOPHAGOGASTRODUODENOSCOPY (EGD) WITH PROPOFOL;  Surgeon: Christena Deem, MD;  Location: [facility] ENDOSCOPY;  Service: Endoscopy;  Laterality: N/A;   NASAL SINUS SURGERY     SHOULDER ARTHROSCOPY WITH ROTATOR CUFF REPAIR AND SUBACROMIAL DECOMPRESSION Left 09/23/2019   Procedure: SHOULDER ARTHROSCOPY WITH MINI ROTATOR CUFF REPAIR AND SUBACROMIAL DECOMPRESSION, BICEPS TENODESIS;  Surgeon: Signa Kell, MD;  Location: [facility];  Service: Orthopedics;  Laterality: Left;   TONSILLECTOMY     TUBAL LIGATION      Social History   Socioeconomic History   Marital status: Legally Separated    Spouse name: Not on file   Number of children: Not on file   Years of education: Not on file   Highest education level: Not on file  Occupational History   Not on file  Tobacco Use   Smoking status: Every  Day    Packs/day: 1.00    Years: 35.00    Additional pack years: 0.00    Total pack years: 35.00    Types: E-cigarettes, Cigarettes    Start date: 03/06/1974    Last attempt to quit: 07/08/2015    Years since quitting: 7.7   Smokeless tobacco: Never   Tobacco comments:    vapor cigarettes, no nicotene  Vaping Use   Vaping Use: Every day   Start date: 07/08/2015   Substances: Flavoring   Devices: [employer]  Substance and Sexual Activity   Alcohol use: No    Alcohol/week: 0.0 standard drinks of alcohol   Drug use: No   Sexual activity: Not Currently    Birth control/protection: Post-menopausal  Other Topics Concern   Not on file  Social History Narrative   Not on file   Social Determinants of Health   Financial Resource Strain: Not on file  Food Insecurity: Not on file  Transportation Needs: Not on file  Physical Activity: Not on file  Stress: Not on file  Social Connections: Not on file  Intimate Partner Violence: Not on file    Family History  Problem Relation Age of Onset   Heart disease Mother    Stroke Mother    Cancer Father        lung   Arthritis Father  Arthritis Paternal Grandmother    Arthritis Paternal Grandfather    Drug abuse Sister    Drug abuse Brother    Post-traumatic stress disorder Brother    Diabetes Sister    Dementia Sister    Drug abuse Brother    Cancer Brother     Allergies  Allergen Reactions   Other Hives and Other (See Comments)    Pt states she was tested for allergies and peas was one that she is allergic to but has never ate enough of them to see a reaction. Pt states when she is around bird feathers she gets the hives FEATHERS   Adhesive [Tape] Hives   Food     PEAS/ patient does not know what type reaction she has/ from allergy test during childhood   Pea Other (See Comments)    Other reaction(Nicole Walter): Unknown.  Pt states she was tested for allergies and peas was one that she is allergic to but has never ate enough of them to  see a reaction.    Review of Systems  Constitutional: Negative.   HENT: Negative.    Eyes: Negative.   Respiratory: Negative.    Cardiovascular: Negative.   Gastrointestinal: Negative.   Genitourinary: Negative.        Urge incontinence  Musculoskeletal:        As in hpi  Skin: Negative.   Neurological: Negative.   Endo/Heme/Allergies: Negative.        Objective:   BP 112/74   Pulse 67   Ht 5\' 4"  (1.626 m)   Wt 166 lb (75.3 kg)   SpO2 99%   BMI 28.49 kg/m   Vitals:   03/21/23 1127  BP: 112/74  Pulse: 67  Height: 5\' 4"  (1.626 m)  Weight: 166 lb (75.3 kg)  SpO2: 99%  BMI (Calculated): 28.48    Physical Exam   No results found for any visits on 03/21/23.  Recent Results (from the past 2160 hour(Kenichi Cassada))  CMP14+EGFR     Status: None   Collection Time: 12/25/22  8:46 AM  Result Value Ref Range   Glucose 81 70 - 99 mg/dL   BUN 17 8 - 27 mg/dL   Creatinine, Ser 1.61 0.57 - 1.00 mg/dL   eGFR 86 >09 UE/AVW/0.98   BUN/Creatinine Ratio 22 12 - 28   Sodium 144 134 - 144 mmol/L   Potassium 3.7 3.5 - 5.2 mmol/L   Chloride 104 96 - 106 mmol/L   CO2 25 20 - 29 mmol/L   Calcium 9.9 8.7 - 10.3 mg/dL   Total Protein 6.9 6.0 - 8.5 g/dL   Albumin 4.7 3.8 - 4.9 g/dL   Globulin, Total 2.2 1.5 - 4.5 g/dL   Albumin/Globulin Ratio 2.1 1.2 - 2.2   Bilirubin Total 0.5 0.0 - 1.2 mg/dL   Alkaline Phosphatase 83 44 - 121 IU/L   AST 17 0 - 40 IU/L   ALT 24 0 - 32 IU/L  CBC With Differential     Status: Abnormal   Collection Time: 12/25/22  8:46 AM  Result Value Ref Range   WBC 9.9 3.4 - 10.8 x10E3/uL   RBC 4.42 3.77 - 5.28 x10E6/uL   Hemoglobin 13.3 11.1 - 15.9 g/dL   Hematocrit 11.9 14.7 - 46.6 %   MCV 89 79 - 97 fL   MCH 30.1 26.6 - 33.0 pg   MCHC 33.7 31.5 - 35.7 g/dL   RDW 82.9 56.2 - 13.0 %   Neutrophils 51 Not Estab. %  Lymphs 39 Not Estab. %   Monocytes 8 Not Estab. %   Eos 1 Not Estab. %   Basos 1 Not Estab. %   Neutrophils Absolute 5.1 1.4 - 7.0 x10E3/uL    Lymphocytes Absolute 3.9 (H) 0.7 - 3.1 x10E3/uL   Monocytes Absolute 0.8 0.1 - 0.9 x10E3/uL   EOS (ABSOLUTE) 0.1 0.0 - 0.4 x10E3/uL   Basophils Absolute 0.1 0.0 - 0.2 x10E3/uL   Immature Granulocytes 0 Not Estab. %   Immature Grans (Abs) 0.0 0.0 - 0.1 x10E3/uL  Lipid panel     Status: Abnormal   Collection Time: 12/25/22  8:46 AM  Result Value Ref Range   Cholesterol, Total 224 (H) 100 - 199 mg/dL   Triglycerides 80 0 - 149 mg/dL   HDL 68 >40 mg/dL   VLDL Cholesterol Cal 14 5 - 40 mg/dL   LDL Chol Calc (NIH) 981 (H) 0 - 99 mg/dL   Chol/HDL Ratio 3.3 0.0 - 4.4 ratio    Comment:                                   T. Chol/HDL Ratio                                             Men  Women                               1/2 Avg.Risk  3.4    3.3                                   Avg.Risk  5.0    4.4                                2X Avg.Risk  9.6    7.1                                3X Avg.Risk 23.4   11.0   Hemoglobin A1c     Status: None   Collection Time: 12/25/22  8:46 AM  Result Value Ref Range   Hgb A1c MFr Bld 5.5 4.8 - 5.6 %    Comment:          Prediabetes: 5.7 - 6.4          Diabetes: >6.4          Glycemic control for adults with diabetes: <7.0    Est. average glucose Bld gHb Est-mCnc 111 mg/dL  TSH     Status: None   Collection Time: 12/25/22  8:46 AM  Result Value Ref Range   TSH 1.350 0.450 - 4.500 uIU/mL  Drug Screen 13 with reflex Confirmation (AMP,BAR,BZO,COC,PCP,THC,OPI,OXY,MD,FEN,MEP,PPX,TRAM), Serum     Status: Abnormal   Collection Time: 01/15/23  4:16 PM  Result Value Ref Range   Amphetamines, IA Negative Cutoff:50 ng/mL   Barbiturates, IA Negative Cutoff:0.1 ug/mL   Benzodiazepines, IA ++POSITIVE++ (A) Cutoff:20 ng/mL   Cocaine & Metabolite, IA Negative Cutoff:25 ng/mL   Phencyclidine, IA Negative Cutoff:8 ng/mL   THC(Marijuana) Metabolite, IA  Negative Cutoff:5 ng/mL   Opiates, IA Negative Cutoff:5 ng/mL   Oxycodones, IA Negative Cutoff:5 ng/mL   Methadone,  IA Negative Cutoff:25 ng/mL   FENTANYL, IA Negative Cutoff:1.0 ng/mL   Propoxyphene, IA Negative Cutoff:50 ng/mL   MEPERIDINE, IA Negative Cutoff:100 ng/mL   TRAMADOL, IA Negative Cutoff:50 ng/mL    Comment: This test was developed and its performance characteristics determined by [facility].  It has not been cleared or approved by the Food and Drug Administration.   Benzodiazepines,Ms,Wb/Sp Rfx     Status: None   Collection Time: 01/15/23  4:16 PM  Result Value Ref Range   Benzodiazepines Confirm Positive    Diazepam 569 ng/mL   Desmethyldiazepam 501 ng/mL   Oxazepam 17 ng/mL   Temazepam 24 ng/mL   Chlordiazepoxide Negative    Desmethylchlordiazepoxide Negative     Comment: Expected metabolism of benzodiazepine class drugs:   Parent Drug       Detected Metabolites  -----------       --------------------  Diazepam:         Desmethyldiazepam, Temazepam, Oxazepam  Chlordiazepoxide: Desmethyldiazepam, Oxazepam  Clorazepate:      Desmethyldiazepam, Oxazepam  Halazepam:        Desmethyldiazepam, Oxazepam  Temazepam:        Oxazepam  Oxazepam:         None    Alprazolam Negative ng/mL   Triazolam Negative ng/mL   Lorazepam Negative ng/mL   Flurazepam Negative ng/mL   Desalkylflurazepam Negative ng/mL   Midazolam Negative ng/mL   Clonazepam Negative ng/mL   7-Aminoclonazepam Negative ng/mL    Comment: Confirmation thresholds: 2  ng/mL: alprazolam, triazolam, midazolam and clonazepam 10 ng/mL: diazepam, desmethyldiazepam, oxazepam, temazepam,           chlordiazepoxide, desmethylchlordiazepoxide,           lorazepam, flurazepam, desalkylflurazepam,           and 7-aminoclonazepam.   IGP,Aptima HPV Age Gdln,CtNgTv     Status: None   Collection Time: 01/25/23 12:00 AM  Result Value Ref Range   Age Gdln ACOG Testing 30-65   IGP,CtNgTv,Apt HPV,rfx16/18,45     Status: None   Collection Time: 01/25/23 12:00 AM  Result Value Ref Range   DIAGNOSIS: Comment     Comment: NEGATIVE FOR  INTRAEPITHELIAL LESION OR MALIGNANCY.   Specimen adequacy: Comment     Comment: Satisfactory for evaluation. Endocervical and/or squamous metaplastic cells (endocervical component) are present.    Clinician Provided ICD10 Comment     Comment: Z12.72 Z20.2 N76.0 R87.619    Performed by: Comment     Comment: Wandra Arthurs, Cytotechnologist (ASCP)   PAP Smear Comment .    Note: Comment     Comment: The Pap smear is a screening test designed to aid in the detection of premalignant and malignant conditions of the uterine cervix.  It is not a diagnostic procedure and should not be used as the sole means of detecting cervical cancer.  Both false-positive and false-negative reports do occur.    Test Methodology Comment     Comment: This liquid based ThinPrep(R) pap test was screened with the use of an image guided system.    HPV Aptima Negative Negative    Comment: This nucleic acid amplification test detects fourteen high-risk HPV types (16,18,31,33,35,39,45,51,52,56,58,59,66,68) without differentiation.    HPV Genotype Reflex Comment     Comment: Criteria not met, HPV Genotype not performed.   Chlamydia, Nuc. Acid Amp Negative Negative   Gonococcus, Nuc. Acid Amp  Negative Negative   Trich vag by NAA Negative Negative      Assessment & Plan:   Problem List Items Addressed This Visit   None   No follow-ups on file.   Total time spent: 20 minutes  Luna Fuse, MD  03/21/2023   This document may have been prepared by [facility] Voice Recognition software and as such may include unintentional dictation errors.

## 2023-03-22 ENCOUNTER — Telehealth: Payer: Self-pay

## 2023-03-22 DIAGNOSIS — G8929 Other chronic pain: Secondary | ICD-10-CM

## 2023-03-22 DIAGNOSIS — M542 Cervicalgia: Secondary | ICD-10-CM

## 2023-03-22 DIAGNOSIS — M5412 Radiculopathy, cervical region: Secondary | ICD-10-CM

## 2023-03-22 DIAGNOSIS — G894 Chronic pain syndrome: Secondary | ICD-10-CM

## 2023-03-22 DIAGNOSIS — M5134 Other intervertebral disc degeneration, thoracic region: Secondary | ICD-10-CM

## 2023-03-22 DIAGNOSIS — M5414 Radiculopathy, thoracic region: Secondary | ICD-10-CM

## 2023-03-22 NOTE — Telephone Encounter (Signed)
The patient has prepaid medicaid plan and they will not approve MRI's without recent PT. Your notes say she has tried PT in the past, but that was 4 years ago and does not count. It is pending denial for all 3 MRI's.

## 2023-03-28 NOTE — Telephone Encounter (Signed)
Voice mail left asking patient if she wants to proceed with PT and where.

## 2023-03-30 ENCOUNTER — Ambulatory Visit: Payer: Medicaid Other | Admitting: Internal Medicine

## 2023-04-03 ENCOUNTER — Encounter: Payer: Self-pay | Admitting: Physical Therapy

## 2023-04-03 ENCOUNTER — Ambulatory Visit: Payer: Medicaid Other | Attending: Internal Medicine | Admitting: Physical Therapy

## 2023-04-03 DIAGNOSIS — R2689 Other abnormalities of gait and mobility: Secondary | ICD-10-CM | POA: Insufficient documentation

## 2023-04-03 DIAGNOSIS — M2170 Unequal limb length (acquired), unspecified site: Secondary | ICD-10-CM | POA: Insufficient documentation

## 2023-04-03 DIAGNOSIS — R200 Anesthesia of skin: Secondary | ICD-10-CM | POA: Diagnosis present

## 2023-04-03 DIAGNOSIS — M533 Sacrococcygeal disorders, not elsewhere classified: Secondary | ICD-10-CM | POA: Insufficient documentation

## 2023-04-03 DIAGNOSIS — N3941 Urge incontinence: Secondary | ICD-10-CM | POA: Diagnosis not present

## 2023-04-03 DIAGNOSIS — M5459 Other low back pain: Secondary | ICD-10-CM | POA: Insufficient documentation

## 2023-04-03 NOTE — Patient Instructions (Signed)
Wear shoe lift in R shoe   Replace every 6 months  Emailed company to buy large size in toe box , medium in heel   __  Walk with longer stride, pick up knee And lean more forward

## 2023-04-03 NOTE — Therapy (Signed)
OUTPATIENT PHYSICAL THERAPY EVALUATION   Patient Name: Nicole Walter MRN: [id number] DOB:29-Oct-1961, 62 y.o., female Today's Date: 04/03/2023   PT End of Session - 04/03/23 1633     Visit Number 1    Number of Visits 10    Date for PT Re-Evaluation 06/12/23    Authorization Type Medicaid    PT Start Time 1630    PT Stop Time 1715    PT Time Calculation (min) 45 min    Activity Tolerance Patient tolerated treatment well;No increased pain    Behavior During Therapy [facility] for tasks assessed/performed             Past Medical History:  Diagnosis Date   Abnormal liver enzymes 02/08/2016   Allergy    Anxiety    Chest pain 07/05/2016   Chronic pain    Depression    DJD (degenerative joint disease)    lumbar   Fibromyalgia    High cholesterol    Rash of back 01/28/2016   Weight loss due to medication    Past Surgical History:  Procedure Laterality Date   ANKLE SURGERY     x 2   BREAST BIOPSY Left 1997   CHOLECYSTECTOMY     COLONOSCOPY     COLONOSCOPY WITH PROPOFOL N/A 02/22/2016   Procedure: COLONOSCOPY WITH PROPOFOL;  Surgeon: Christena Deem, MD;  Location: [facility] ENDOSCOPY;  Service: Endoscopy;  Laterality: N/A;   ESOPHAGOGASTRODUODENOSCOPY (EGD) WITH PROPOFOL N/A 02/22/2016   Procedure: ESOPHAGOGASTRODUODENOSCOPY (EGD) WITH PROPOFOL;  Surgeon: Christena Deem, MD;  Location: [facility] ENDOSCOPY;  Service: Endoscopy;  Laterality: N/A;   NASAL SINUS SURGERY     SHOULDER ARTHROSCOPY WITH ROTATOR CUFF REPAIR AND SUBACROMIAL DECOMPRESSION Left 09/23/2019   Procedure: SHOULDER ARTHROSCOPY WITH MINI ROTATOR CUFF REPAIR AND SUBACROMIAL DECOMPRESSION, BICEPS TENODESIS;  Surgeon: Signa Kell, MD;  Location: [facility]ERY [facility];  Service: Orthopedics;  Laterality: Left;   TONSILLECTOMY     TUBAL LIGATION     Patient Active Problem List   Diagnosis Date Noted   Chronic radicular lumbar pain 03/20/2023   Exposure to sexually transmitted disease (STD) 01/25/2023   Acute vaginitis  01/25/2023   Abnormal cervical Papanicolaou smear 01/25/2023   Abnormal MRI, cervical spine (09/04/2017) 07/22/2019   Neurogenic pain 07/22/2019   DDD (degenerative disc disease), cervical 06/09/2019   Chronic musculoskeletal pain 05/14/2019   Trypanophobia (Needle phobia) 09/26/2018   Spondylosis without myelopathy or radiculopathy, lumbar region 08/29/2018   Lumbar facet hypertrophy 08/29/2018   Lumbar facet syndrome (Right) 08/29/2018   DDD (degenerative disc disease), thoracic 08/29/2018   Thoracic spondylosis with radiculopathy (Right) 08/29/2018   Spinal enthesopathy of lumbar region (HCC) 08/29/2018   Chronic midline low back pain without sciatica 08/29/2018   Generalized anxiety disorder 07/23/2018   Cervical facet hypertrophy (Bilateral) 07/18/2018   Cervical central spinal stenosis 07/18/2018   Chronic shoulder pain (Left) 07/09/2018   Cervicalgia (Bilateral) (L>R) 03/28/2018   Spondylosis without myelopathy or radiculopathy, cervical region 02/11/2018   Chronic pelvic pain in female 08/24/2017   Fatty liver 08/24/2017   Chronic low back pain (Bilateral) (L>R) 08/13/2017   Cervical Foraminal Stenosis (Severe) (Bilateral: C5-6) 08/13/2017   Cervical spondylosis with radiculopathy (Bilateral) (L>R) 08/13/2017   Cervical sensory radiculopathy at C5 (Bilateral) 08/13/2017   Cervical radicular pain 08/13/2017   Constipation 07/16/2017   Myofascial pain syndrome, cervical (trapezius) (Left) 05/28/2017   Cervical facet syndrome (Bilateral) (L>R) 05/28/2017   Muscle spasm 05/15/2017   Chronic hip pain (Right) 03/19/2017  Chronic knee arthropathy (Right) 03/14/2017   Osteoarthritis of knee (Right) 02/21/2017   Osteoarthritis of shoulder (Right) 01/31/2017   Shortness of breath 01/15/2017   Thoracic spondylosis 01/01/2017   Radicular pain of thoracic region 12/21/2016   Osteoarthritis of hip (Bilateral) 11/20/2016   Greater trochanteric bursitis (Right) 09/18/2016    Subacromial bursitis of shoulder joint (Right) 09/18/2016   Overweight (BMI 25.0-29.9) 09/14/2016   Elevated sedimentation rate 08/31/2016   Elevated C-reactive protein (CRP) 08/31/2016   Long term current use of opiate analgesic 07/20/2016   Long term prescription opiate use 07/20/2016   Opiate use 07/20/2016   Chronic upper back pain (Secondary Area of Pain) (Bilateral) (L>R) 07/20/2016   Chronic shoulder pain (Tertiary Area of Pain) (Bilateral) (L>R) 07/20/2016   Chronic knee pain (Right) 07/20/2016   Chronic upper extremity pain (Left) 07/20/2016   Chronic lower extremity pain (Bilateral) (L>R) 07/20/2016   Chronic abdominal pain 07/20/2016   Chronic hip pain (Bilateral) (R>L) 05/31/2016   Chronic Greater trochanteric bursitis (Bilateral) (L>R) 05/31/2016   Bursitis of shoulder (Right) 05/17/2016   Skin lesions 04/27/2016   Hyperlipidemia 02/18/2016   Cervical spondylosis (Bilateral) 02/08/2016   Fibromyalgia 02/08/2016   Osteopenia 02/08/2016   Chronic neck pain (Primary Area of Pain) (Bilateral) (L>R) 01/28/2016   Concentration deficit 01/28/2016   Special screening for malignant neoplasms, vagina 01/14/2016   Lumbar spondylosis 01/06/2016   Anxiety and depression 07/11/2015   Chronic pain syndrome 07/11/2015   Combined fat and carbohydrate induced hyperlipemia 07/11/2015    PCP: Ellsworth Lennox   REFERRING PROVIDER:    [name] ( urge incontinence)   [name] ( cervical , thoracic, LBP, chronic pain)   REFERRING DIAG: Urge incontinence  Rationale for Evaluation and Treatment Rehabilitation  THERAPY DIAG:  Sacrococcygeal disorders, not elsewhere classified  Other abnormalities of gait and mobility  Leg length difference, acquired  Numbness in both hands  Other low back pain  ONSET DATE:   SUBJECTIVE:                                                                                                                                                                                            SUBJECTIVE STATEMENT: 1) Urge incontinence and SUI  : 3 out of 10 times, pt has had leakage before making it to the bathroom . Changes 2 pads per day.    2) neck pain, midback, R LBP:  From sitting position, pt feels there is locking from her R hip and low back. Pt had been provided a shoe lift 25 years ago and she used it for 5 years and it helped with pressure. Pt has  not worn one since and was not given information about where to get more shoe lifts. Pt has no radiating pain currently. Pt feels she is leaning to the R. It takes her a few minute to stretch her back after sitting or driving   Pt has tingling and numbness in both her arms and to index and middle fingers on R hand, and pinky on the L hand in the morning. N/T does not occur with other activities.   PERTINENT HISTORY:  Tubal ligation,  L shoulder surgery, 2 surgeries on L ankle, 2 vaginal deliveries with vacuum and forceps   PAIN:  Are you having pain? Yes: see above   PRECAUTIONS: None  WEIGHT BEARING RESTRICTIONS: No  FALLS:  Has patient fallen in last 6 months? No  LIVING ENVIRONMENT: Lives with: lives alone Lives in: House/apartment Stairs: No  OCCUPATION: caregiving and cleaning   PLOF: Independent  PATIENT GOALS:   Would like to stop hurting and return to physical activities     OBJECTIVE:    [facility] PT Assessment - 04/03/23 1649       AROM   Overall AROM Comments L sidebend/ rotation with WNL  with LBP  ( post Tx with shoe lift in R shoe : L sidebend/ rotation with LBP "not as sharp"      Strength   Overall Strength Comments R hip flexion 3/5, L hip flexion 4/5 with referred pain to R LBP   R UE weaker than L UE but sensation testing was normal      Palpation   SI assessment  L iliac crest and L shoulder higher  ( POst Tx with shoe lif tin R shoe : levelled pelvic girdle)      Ambulation/Gait   Gait Comments short strides minimal hip flexion,push off, decreased stance on R,  1.10 m/s             [facility] Adult PT Treatment/Exercise - 04/03/23 1723       Ambulation/Gait   Gait Comments short strides minimal hip flexion,push off, decreased stance on R, 1.10 m/s      Therapeutic Activites    Therapeutic Activities Other Therapeutic Activities    Other Therapeutic Activities instruction how to place shoe lift in R shoe,      Neuro Re-ed    Neuro Re-ed Details  cued for walking with longer strides and more anterior COM,             [facility] Adult PT Treatment/Exercise - 04/03/23 1723       Ambulation/Gait   Gait Comments short strides minimal hip flexion,push off, decreased stance on R, 1.10 m/s      Therapeutic Activites    Therapeutic Activities Other Therapeutic Activities    Other Therapeutic Activities instruction how to place shoe lift in R shoe,      Neuro Re-ed    Neuro Re-ed Details  cued for walking with longer strides and more anterior COM,              HOME EXERCISE PROGRAM: See pt instruction section    ASSESSMENT:  CLINICAL IMPRESSION:  Pt is a  62 yo who presents with urge incontinence, SUI, in addition to neck pain, midback, R LBP  which impact QOL, ADL, fitness, and community activities.   Pt's musculoskeletal assessment revealed uneven pelvic girdle and shoulder height, asymmetries to gait pattern, limited spinal /pelvic mobility, dyscoordination and strength of pelvic floor mm, hip weakness, poor body mechanics which places strain  on the abdominal/pelvic floor mm. These are deficits that indicate an ineffective intraabdominal pressure system associated with increased risk for pt's Sx.   Pt was provided education on etiology of Sx with anatomy, physiology explanation with images along with the benefits of customized pelvic PT Tx based on pt's medical conditions and musculoskeletal deficits.  Explained the physiology of deep core mm coordination and roles of pelvic floor function in urination, defecation, sexual function, and  postural control with deep core mm system.   Regional interdependent approaches will yield greater benefits given pt's leg length difference noted today  upon assessment. Provided shoe lift in toe box and heel lift in R shoe after which  R LBP decreased with L sidebend and rotation, equal alignment of pelvic girdle ,  and gait speed increased. Pt demo'd reciprocal gait pattern instead of decreased R stance phase.     Plan to progress to deep core coordination training once pelvic girdle alignment has been achieved. Anticipate cervical/ midback/ LBP will improve with correction to pelvic alignment.     Pt benefits from skilled PT.    OBJECTIVE IMPAIRMENTS decreased activity tolerance, decreased coordination, decreased endurance, decreased mobility, difficulty walking, decreased ROM, decreased strength, decreased safety awareness, hypomobility, increased muscle spasms, impaired flexibility, improper body mechanics, postural dysfunction, and pain. scar restrictions   ACTIVITY LIMITATIONS  self-care,  sleep, home chores, work tasks    PARTICIPATION LIMITATIONS:  community, gym activities    PERSONAL FACTORS       are also affecting patient's functional outcome.    REHAB POTENTIAL: Good   CLINICAL DECISION MAKING: Evolving/moderate complexity   EVALUATION COMPLEXITY: Moderate    PATIENT EDUCATION:    Education details: Showed pt anatomy images. Explained muscles attachments/ connection, physiology of deep core system/ spinal- thoracic-pelvis-lower kinetic chain as they relate to pt's presentation, Sx, and past Hx. Explained what and how these areas of deficits need to be restored to balance and function    See Therapeutic activity / neuromuscular re-education section  Answered pt's questions.   Person educated: Patient Education method: Explanation, Demonstration, Tactile cues, Verbal cues, and Handouts Education comprehension: verbalized understanding, returned demonstration, verbal  cues required, tactile cues required, and needs further education     PLAN: PT FREQUENCY: 1x/week   PT DURATION: 10 weeks   PLANNED INTERVENTIONS: Therapeutic exercises, Therapeutic activity, Neuromuscular re-education, Balance training, Gait training, Patient/Family education, Self Care, Joint mobilization, Spinal mobilization, Moist heat, Taping, and Manual therapy, dry needling.   PLAN FOR NEXT SESSION: See clinical impression for plan     GOALS: Goals reviewed with patient? Yes  SHORT TERM GOALS: Target date: 05/01/2023    Pt will demo IND with HEP                    Baseline: Not IND            Goal status: INITIAL   LONG TERM GOALS: Target date: 06/12/2023    1.Pt will demo proper deep core coordination without chest breathing and optimal excursion of diaphragm/pelvic floor in order to promote spinal stability and pelvic floor function  Baseline: dyscoordination Goal status: INITIAL  2.  Pt will demo > 5 pt change on FOTO  to improve QOL and function    Urinary Problem baseline-   59  Higher score = better function    Lumber baseline  - 55 Higher score = better function   Goal status: INITIAL  3.  Pt will demo proper body mechanics  in against gravity tasks and ADLs  work tasks, fitness  to minimize straining pelvic floor / back                  Baseline: not IND, improper form that places strain on pelvic floor                Goal status: INITIAL    4. Pt will demo levelled pelvic girdle and shoulder height in order to progress to deep core strengthening HEP and restore mobility at spine, pelvis, gait, posture   Baseline: L shoulder and iliac crest higher  Goal status: INITIAL    5. Pt will report no more N/T upon waking in there B hands in order to improve ADL and QOL  Baseline: Pt has tingling and numbness in both her arms and to index and middle fingers on R hand, and pinky on the L hand in the morning.  Goal status: INITIAL   6. Pt will demo  increased gait speed > 1.3 m/s with reciprocal gait  in order to ambulate safely in community and return to fitness routine  Baseline: short strides minimal hip flexion,push off, decreased stance on R, 1.10 m/s  Goal status: INITIAL      Mariane Masters, PT 04/03/2023, 5:25 PM

## 2023-04-20 ENCOUNTER — Ambulatory Visit (INDEPENDENT_AMBULATORY_CARE_PROVIDER_SITE_OTHER): Payer: Medicaid Other | Admitting: Internal Medicine

## 2023-04-20 ENCOUNTER — Encounter: Payer: Self-pay | Admitting: Internal Medicine

## 2023-04-20 VITALS — BP 122/64 | HR 79 | Ht 64.0 in | Wt 162.0 lb

## 2023-04-20 DIAGNOSIS — M5442 Lumbago with sciatica, left side: Secondary | ICD-10-CM

## 2023-04-20 DIAGNOSIS — F32A Depression, unspecified: Secondary | ICD-10-CM

## 2023-04-20 DIAGNOSIS — K5900 Constipation, unspecified: Secondary | ICD-10-CM | POA: Diagnosis not present

## 2023-04-20 DIAGNOSIS — F419 Anxiety disorder, unspecified: Secondary | ICD-10-CM | POA: Diagnosis not present

## 2023-04-20 DIAGNOSIS — M5441 Lumbago with sciatica, right side: Secondary | ICD-10-CM

## 2023-04-20 DIAGNOSIS — J302 Other seasonal allergic rhinitis: Secondary | ICD-10-CM

## 2023-04-20 DIAGNOSIS — G8929 Other chronic pain: Secondary | ICD-10-CM

## 2023-04-20 DIAGNOSIS — J301 Allergic rhinitis due to pollen: Secondary | ICD-10-CM

## 2023-04-20 DIAGNOSIS — F17200 Nicotine dependence, unspecified, uncomplicated: Secondary | ICD-10-CM | POA: Diagnosis not present

## 2023-04-20 DIAGNOSIS — Z720 Tobacco use: Secondary | ICD-10-CM

## 2023-04-20 DIAGNOSIS — E559 Vitamin D deficiency, unspecified: Secondary | ICD-10-CM

## 2023-04-20 DIAGNOSIS — Z716 Tobacco abuse counseling: Secondary | ICD-10-CM

## 2023-04-20 MED ORDER — CETIRIZINE HCL 10 MG PO TABS: 10.0000 mg | ORAL_TABLET | Freq: Every morning | ORAL | 2 refills | Status: DC

## 2023-04-20 MED ORDER — AZELASTINE HCL 137 MCG/SPRAY NA SOLN: 1.0000 | Freq: Every day | NASAL | 2 refills | Status: DC

## 2023-04-20 MED ORDER — OXYCODONE HCL 5 MG PO TABS: 5.0000 mg | ORAL_TABLET | Freq: Four times a day (QID) | ORAL | 0 refills | Status: DC | PRN

## 2023-04-20 MED ORDER — VITAMIN D3 125 MCG (5000 UT) PO CAPS: 5000.0000 [IU] | ORAL_CAPSULE | Freq: Every day | ORAL | 3 refills | Status: AC

## 2023-04-20 MED ORDER — BUSPIRONE HCL 7.5 MG PO TABS: 7.5000 mg | ORAL_TABLET | Freq: Two times a day (BID) | ORAL | 1 refills | Status: DC

## 2023-04-20 NOTE — Progress Notes (Signed)
Established Patient Office Visit  Subjective:  Patient ID: Nicole Walter, female    DOB: 1961/07/21  Age: 62 y.o. MRN: [id number]  Chief Complaint  Patient presents with   Follow-up    PM    Here for pain management follow up. Chronic pain well controlled on current analgesia. Last UDS satisfactory and pill counts have also been satisfactory. C/o uncontrolled anxiety.    No other concerns at this time.   Past Medical History:  Diagnosis Date   Abnormal liver enzymes 02/08/2016   Allergy    Anxiety    Chest pain 07/05/2016   Chronic pain    Depression    DJD (degenerative joint disease)    lumbar   Fibromyalgia    High cholesterol    Rash of back 01/28/2016   Weight loss due to medication     Past Surgical History:  Procedure Laterality Date   ANKLE SURGERY     x 2   BREAST BIOPSY Left 1997   CHOLECYSTECTOMY     COLONOSCOPY     COLONOSCOPY WITH PROPOFOL N/A 02/22/2016   Procedure: COLONOSCOPY WITH PROPOFOL;  Surgeon: Christena Deem, MD;  Location: [facility] ENDOSCOPY;  Service: Endoscopy;  Laterality: N/A;   ESOPHAGOGASTRODUODENOSCOPY (EGD) WITH PROPOFOL N/A 02/22/2016   Procedure: ESOPHAGOGASTRODUODENOSCOPY (EGD) WITH PROPOFOL;  Surgeon: Christena Deem, MD;  Location: [facility] ENDOSCOPY;  Service: Endoscopy;  Laterality: N/A;   NASAL SINUS SURGERY     SHOULDER ARTHROSCOPY WITH ROTATOR CUFF REPAIR AND SUBACROMIAL DECOMPRESSION Left 09/23/2019   Procedure: SHOULDER ARTHROSCOPY WITH MINI ROTATOR CUFF REPAIR AND SUBACROMIAL DECOMPRESSION, BICEPS TENODESIS;  Surgeon: Signa Kell, MD;  Location: [facility];  Service: Orthopedics;  Laterality: Left;   TONSILLECTOMY     TUBAL LIGATION      Social History   Socioeconomic History   Marital status: Legally Separated    Spouse name: Not on file   Number of children: Not on file   Years of education: Not on file   Highest education level: Not on file  Occupational History   Not on file  Tobacco Use   Smoking  status: Every Day    Packs/day: 1.00    Years: 35.00    Additional pack years: 0.00    Total pack years: 35.00    Types: E-cigarettes, Cigarettes    Start date: 03/06/1974    Last attempt to quit: 07/08/2015    Years since quitting: 7.7   Smokeless tobacco: Never   Tobacco comments:    vapor cigarettes, no nicotene  Vaping Use   Vaping Use: Every day   Start date: 07/08/2015   Substances: Flavoring   Devices: [employer]  Substance and Sexual Activity   Alcohol use: No    Alcohol/week: 0.0 standard drinks of alcohol   Drug use: No   Sexual activity: Not Currently    Birth control/protection: Post-menopausal  Other Topics Concern   Not on file  Social History Narrative   Not on file   Social Determinants of Health   Financial Resource Strain: Not on file  Food Insecurity: Not on file  Transportation Needs: Not on file  Physical Activity: Not on file  Stress: Not on file  Social Connections: Not on file  Intimate Partner Violence: Not on file    Family History  Problem Relation Age of Onset   Heart disease Mother    Stroke Mother    Cancer Father        lung   Arthritis Father  Arthritis Paternal Grandmother    Arthritis Paternal Grandfather    Drug abuse Sister    Drug abuse Brother    Post-traumatic stress disorder Brother    Diabetes Sister    Dementia Sister    Drug abuse Brother    Cancer Brother     Allergies  Allergen Reactions   Other Hives and Other (See Comments)    Pt states she was tested for allergies and peas was one that she is allergic to but has never ate enough of them to see a reaction. Pt states when she is around bird feathers she gets the hives FEATHERS   Adhesive [Tape] Hives   Food     PEAS/ patient does not know what type reaction she has/ from allergy test during childhood   Pea Other (See Comments)    Other reaction(Bradyn Soward): Unknown.  Pt states she was tested for allergies and peas was one that she is allergic to but has never ate  enough of them to see a reaction.    Review of Systems  Constitutional: Negative.   HENT: Negative.    Eyes: Negative.   Respiratory: Negative.    Cardiovascular: Negative.   Gastrointestinal: Negative.   Genitourinary: Negative.        Urge incontinence  Musculoskeletal:  Positive for back pain, joint pain and neck pain.       As in hpi  Skin: Negative.   Neurological: Negative.   Endo/Heme/Allergies: Negative.        Objective:   BP 122/64   Pulse 79   Ht 5\' 4"  (1.626 m)   Wt 162 lb (73.5 kg)   SpO2 98%   BMI 27.81 kg/m   Vitals:   04/20/23 1402  BP: 122/64  Pulse: 79  Height: 5\' 4"  (1.626 m)  Weight: 162 lb (73.5 kg)  SpO2: 98%  BMI (Calculated): 27.79    Physical Exam Vitals reviewed.  Constitutional:      General: She is not in acute distress. HENT:     Head: Normocephalic.     Nose: Nose normal.     Mouth/Throat:     Mouth: Mucous membranes are moist.  Eyes:     Extraocular Movements: Extraocular movements intact.     Pupils: Pupils are equal, round, and reactive to light.  Cardiovascular:     Rate and Rhythm: Normal rate and regular rhythm.     Heart sounds: No murmur heard. Pulmonary:     Effort: Pulmonary effort is normal.     Breath sounds: No rhonchi or rales.  Abdominal:     General: Abdomen is flat.     Palpations: There is no hepatomegaly, splenomegaly or mass.  Musculoskeletal:        General: Normal range of motion.     Cervical back: Normal range of motion. No tenderness.  Skin:    General: Skin is warm and dry.  Neurological:     General: No focal deficit present.     Mental Status: She is alert and oriented to person, place, and time.     Cranial Nerves: No cranial nerve deficit.     Motor: No weakness.  Psychiatric:        Mood and Affect: Mood normal.        Behavior: Behavior normal.      No results found for any visits on 04/20/23.      Assessment & Plan:   Problem List Items Addressed This Visit  Nervous and Auditory   Chronic low back pain (Bilateral) (L>R) (Chronic)   Relevant Medications   oxyCODONE (OXY IR/ROXICODONE) 5 MG immediate release tablet   busPIRone (BUSPAR) 7.5 MG tablet     Other   Anxiety and depression   Relevant Medications   busPIRone (BUSPAR) 7.5 MG tablet   Constipation - Primary   Other Visit Diagnoses     Seasonal allergic rhinitis, unspecified trigger       Relevant Medications   Azelastine HCl 137 MCG/SPRAY SOLN   cetirizine (ZYRTEC) 10 MG tablet   Seasonal allergic rhinitis due to pollen       Vitamin D deficiency       Relevant Medications   Cholecalciferol (VITAMIN D3) 125 MCG (5000 UT) CAPS   Tobacco abuse       Relevant Orders   CT CHEST LUNG CA SCREEN LOW DOSE W/O CM     Smoking cessation instruction/counseling given:  counseled patient on the dangers of tobacco use, advised patient to stop smoking, and reviewed strategies to maximize success   Return in about 1 month (around 05/20/2023).   Total time spent: 20 minutes  Luna Fuse, MD  04/20/2023   This document may have been prepared by [facility] Voice Recognition software and as such may include unintentional dictation errors.

## 2023-04-23 ENCOUNTER — Ambulatory Visit: Payer: Medicaid Other | Attending: Internal Medicine | Admitting: Physical Therapy

## 2023-04-23 DIAGNOSIS — M533 Sacrococcygeal disorders, not elsewhere classified: Secondary | ICD-10-CM | POA: Insufficient documentation

## 2023-04-23 DIAGNOSIS — R2689 Other abnormalities of gait and mobility: Secondary | ICD-10-CM | POA: Diagnosis present

## 2023-04-23 DIAGNOSIS — M5459 Other low back pain: Secondary | ICD-10-CM | POA: Insufficient documentation

## 2023-04-23 DIAGNOSIS — M2170 Unequal limb length (acquired), unspecified site: Secondary | ICD-10-CM | POA: Diagnosis present

## 2023-04-23 DIAGNOSIS — R200 Anesthesia of skin: Secondary | ICD-10-CM | POA: Insufficient documentation

## 2023-04-23 NOTE — Patient Instructions (Addendum)
  ____  Neck / shoulder stretches:    Lying on back - small sushi roll towel under neck  _ 6 directions  5 reps   __  Lengthen Back rib by R  shoulder   ( winging)    Lie on L  side , pillow between knees and under head  Pull  R arm overhead over mattress, grab the edge of mattress,pull it upward, drawing elbow away from ears  Breathing 10 reps  Brushing arm with 3/4 turn onto pillow behind back  Lying on L  side ,Pillow/ Block between knees     dragging top forearm across ribs below breast rotating 3/4 turn,  rotating  _R_ only this week ,  relax onto the pillow behind the back  and then back to other palm , maintain top palm on body whole top and not lift shoulder  __   kitchen counter stretches  Hands on kitchen counter,   Palms shoulder width apart  Minisquat postion Trunk is parallel to floor  A) Pull buttocks back to lengthen spine, knees bent  3 breaths   B) Bring R hand to the L, and stretch the R side trunk  3 breaths

## 2023-04-23 NOTE — Therapy (Addendum)
OUTPATIENT PHYSICAL THERAPY Treatment    Patient Name: Nicole Walter MRN: [id number] DOB:29-Jul-1961, 62 y.o., female Today's Date: 04/23/2023   PT End of Session - 04/23/23 1333     Visit Number 2    Number of Visits 10    Date for PT Re-Evaluation 06/12/23    Authorization Type Medicaid    PT Start Time 1330    PT Stop Time 1422   PT Time Calculation (min) 42 min    Activity Tolerance Patient tolerated treatment well;No increased pain    Behavior During Therapy [facility] for tasks assessed/performed             Past Medical History:  Diagnosis Date   Abnormal liver enzymes 02/08/2016   Allergy    Anxiety    Chest pain 07/05/2016   Chronic pain    Depression    DJD (degenerative joint disease)    lumbar   Fibromyalgia    High cholesterol    Rash of back 01/28/2016   Weight loss due to medication    Past Surgical History:  Procedure Laterality Date   ANKLE SURGERY     x 2   BREAST BIOPSY Left 1997   CHOLECYSTECTOMY     COLONOSCOPY     COLONOSCOPY WITH PROPOFOL N/A 02/22/2016   Procedure: COLONOSCOPY WITH PROPOFOL;  Surgeon: Christena Deem, MD;  Location: [facility] ENDOSCOPY;  Service: Endoscopy;  Laterality: N/A;   ESOPHAGOGASTRODUODENOSCOPY (EGD) WITH PROPOFOL N/A 02/22/2016   Procedure: ESOPHAGOGASTRODUODENOSCOPY (EGD) WITH PROPOFOL;  Surgeon: Christena Deem, MD;  Location: [facility] ENDOSCOPY;  Service: Endoscopy;  Laterality: N/A;   NASAL SINUS SURGERY     SHOULDER ARTHROSCOPY WITH ROTATOR CUFF REPAIR AND SUBACROMIAL DECOMPRESSION Left 09/23/2019   Procedure: SHOULDER ARTHROSCOPY WITH MINI ROTATOR CUFF REPAIR AND SUBACROMIAL DECOMPRESSION, BICEPS TENODESIS;  Surgeon: Signa Kell, MD;  Location: [facility];  Service: Orthopedics;  Laterality: Left;   TONSILLECTOMY     TUBAL LIGATION     Patient Active Problem List   Diagnosis Date Noted   Chronic radicular lumbar pain 03/20/2023   Exposure to sexually transmitted disease (STD) 01/25/2023   Acute vaginitis  01/25/2023   Abnormal cervical Papanicolaou smear 01/25/2023   Abnormal MRI, cervical spine (09/04/2017) 07/22/2019   Neurogenic pain 07/22/2019   DDD (degenerative disc disease), cervical 06/09/2019   Chronic musculoskeletal pain 05/14/2019   Trypanophobia (Needle phobia) 09/26/2018   Spondylosis without myelopathy or radiculopathy, lumbar region 08/29/2018   Lumbar facet hypertrophy 08/29/2018   Lumbar facet syndrome (Right) 08/29/2018   DDD (degenerative disc disease), thoracic 08/29/2018   Thoracic spondylosis with radiculopathy (Right) 08/29/2018   Spinal enthesopathy of lumbar region (HCC) 08/29/2018   Chronic midline low back pain without sciatica 08/29/2018   Generalized anxiety disorder 07/23/2018   Cervical facet hypertrophy (Bilateral) 07/18/2018   Cervical central spinal stenosis 07/18/2018   Chronic shoulder pain (Left) 07/09/2018   Cervicalgia (Bilateral) (L>R) 03/28/2018   Spondylosis without myelopathy or radiculopathy, cervical region 02/11/2018   Chronic pelvic pain in female 08/24/2017   Fatty liver 08/24/2017   Chronic low back pain (Bilateral) (L>R) 08/13/2017   Cervical Foraminal Stenosis (Severe) (Bilateral: C5-6) 08/13/2017   Cervical spondylosis with radiculopathy (Bilateral) (L>R) 08/13/2017   Cervical sensory radiculopathy at C5 (Bilateral) 08/13/2017   Cervical radicular pain 08/13/2017   Constipation 07/16/2017   Myofascial pain syndrome, cervical (trapezius) (Left) 05/28/2017   Cervical facet syndrome (Bilateral) (L>R) 05/28/2017   Muscle spasm 05/15/2017   Chronic hip pain (Right) 03/19/2017  Chronic knee arthropathy (Right) 03/14/2017   Osteoarthritis of knee (Right) 02/21/2017   Osteoarthritis of shoulder (Right) 01/31/2017   Shortness of breath 01/15/2017   Thoracic spondylosis 01/01/2017   Radicular pain of thoracic region 12/21/2016   Osteoarthritis of hip (Bilateral) 11/20/2016   Greater trochanteric bursitis (Right) 09/18/2016    Subacromial bursitis of shoulder joint (Right) 09/18/2016   Overweight (BMI 25.0-29.9) 09/14/2016   Elevated sedimentation rate 08/31/2016   Elevated C-reactive protein (CRP) 08/31/2016   Long term current use of opiate analgesic 07/20/2016   Long term prescription opiate use 07/20/2016   Opiate use 07/20/2016   Chronic upper back pain (Secondary Area of Pain) (Bilateral) (L>R) 07/20/2016   Chronic shoulder pain (Tertiary Area of Pain) (Bilateral) (L>R) 07/20/2016   Chronic knee pain (Right) 07/20/2016   Chronic upper extremity pain (Left) 07/20/2016   Chronic lower extremity pain (Bilateral) (L>R) 07/20/2016   Chronic abdominal pain 07/20/2016   Chronic hip pain (Bilateral) (R>L) 05/31/2016   Chronic Greater trochanteric bursitis (Bilateral) (L>R) 05/31/2016   Bursitis of shoulder (Right) 05/17/2016   Skin lesions 04/27/2016   Hyperlipidemia 02/18/2016   Cervical spondylosis (Bilateral) 02/08/2016   Fibromyalgia 02/08/2016   Osteopenia 02/08/2016   Chronic neck pain (Primary Area of Pain) (Bilateral) (L>R) 01/28/2016   Concentration deficit 01/28/2016   Special screening for malignant neoplasms, vagina 01/14/2016   Lumbar spondylosis 01/06/2016   Anxiety and depression 07/11/2015   Chronic pain syndrome 07/11/2015   Combined fat and carbohydrate induced hyperlipemia 07/11/2015    PCP: Ellsworth Lennox   REFERRING PROVIDER:    [name] ( urge incontinence)   [name] ( cervical , thoracic, LBP, chronic pain)   REFERRING DIAG: Urge incontinence  Rationale for Evaluation and Treatment Rehabilitation  THERAPY DIAG:  Sacrococcygeal disorders, not elsewhere classified  Other abnormalities of gait and mobility  Numbness in both hands  Leg length difference, acquired  Other low back pain  ONSET DATE:   SUBJECTIVE:            SUBJECTIVE STATEMENT :  Pt felt discomfort in her shoe at the toes after the heel lift was shifted in her shoe. She has not been wearing it for the 2  weeks. Pt felt sciatic down both hips and butt over the past 2 weeks.                                                                                                                                                                                  SUBJECTIVE STATEMENT ON EVAL 04/03/23  : 1) Urge incontinence and SUI  : 3 out of 10 times, pt has had leakage before making it to the bathroom . Changes 2  pads per day.    2) neck pain, midback, R LBP:  From sitting position, pt feels there is locking from her R hip and low back. Pt had been provided a shoe lift 25 years ago and she used it for 5 years and it helped with pressure. Pt has not worn one since and was not given information about where to get more shoe lifts. Pt has no radiating pain currently. Pt feels she is leaning to the R. It takes her a few minute to stretch her back after sitting or driving   Pt has tingling and numbness in both her arms and to index and middle fingers on R hand, and pinky on the L hand in the morning. N/T does not occur with other activities.   PERTINENT HISTORY:  Tubal ligation,  L shoulder surgery, 2 surgeries on L ankle, 2 vaginal deliveries with vacuum and forceps   PAIN:  Are you having pain? Yes: see above   PRECAUTIONS: None  WEIGHT BEARING RESTRICTIONS: No  FALLS:  Has patient fallen in last 6 months? No  LIVING ENVIRONMENT: Lives with: lives alone Lives in: House/apartment Stairs: No  OCCUPATION: caregiving and cleaning   PLOF: Independent  PATIENT GOALS:   Would like to stop hurting and return to physical activities     OBJECTIVE:   [facility] PT Assessment - 04/23/23 1411       Palpation   SI assessment  L iliac crest and L shoulder higher    Palpation comment signficant tightness along interspinal/ paraspinals from lumbar to cervical L, SCM , medial scapular mm tightness L             [facility] Adult PT Treatment/Exercise - 04/23/23 1412       Therapeutic Activites    Other  Therapeutic Activities readjusted shoe lift in R shoe      Neuro Re-ed    Neuro Re-ed Details  cued for neck and thoracic ROM to realign spine  ( see pt instructions)      Moist Heat Therapy   Number Minutes Moist Heat 5 Minutes  unbilled   Moist Heat Location Cervical   in supported butterfly pose  modified for comfort     Manual Therapy   Manual Therapy Other (comment)    Other Manual Therapy STM/MWM at problem areas noted in assessment to realign spine, mobil e cervical thoracic L                 HOME EXERCISE PROGRAM: See pt instruction section    ASSESSMENT:  CLINICAL IMPRESSION:  Pt showed levelled pelvic / shoulder alignment after manual Tx which addressed deviations related to leg length difference. Pt's shoe lift was adjusted for better comfort.    Regional interdependent approaches will yield greater benefits given pt's leg length difference noted today  upon assessment. Provided shoe lift in toe box and heel lift in R shoe after which  R LBP decreased with L sidebend and rotation, equal alignment of pelvic girdle ,  and gait speed increased.                 Cued for HEP for more AROM and realignment of spine/ pelvic girdle.    Plan to progress to deep core coordination training once pelvic girdle alignment has been achieved. Anticipate cervical/ midback/ LBP will improve with correction to pelvic alignment.     Pt benefits from skilled PT.    OBJECTIVE IMPAIRMENTS decreased activity tolerance, decreased coordination, decreased endurance,  decreased mobility, difficulty walking, decreased ROM, decreased strength, decreased safety awareness, hypomobility, increased muscle spasms, impaired flexibility, improper body mechanics, postural dysfunction, and pain. scar restrictions   ACTIVITY LIMITATIONS  self-care,  sleep, home chores, work tasks    PARTICIPATION LIMITATIONS:  community, gym activities    PERSONAL FACTORS       are also affecting patient's  functional outcome.    REHAB POTENTIAL: Good   CLINICAL DECISION MAKING: Evolving/moderate complexity   EVALUATION COMPLEXITY: Moderate    PATIENT EDUCATION:    Education details: Showed pt anatomy images. Explained muscles attachments/ connection, physiology of deep core system/ spinal- thoracic-pelvis-lower kinetic chain as they relate to pt's presentation, Sx, and past Hx. Explained what and how these areas of deficits need to be restored to balance and function    See Therapeutic activity / neuromuscular re-education section  Answered pt's questions.   Person educated: Patient Education method: Explanation, Demonstration, Tactile cues, Verbal cues, and Handouts Education comprehension: verbalized understanding, returned demonstration, verbal cues required, tactile cues required, and needs further education     PLAN: PT FREQUENCY: 1x/week   PT DURATION: 10 weeks   PLANNED INTERVENTIONS: Therapeutic exercises, Therapeutic activity, Neuromuscular re-education, Balance training, Gait training, Patient/Family education, Self Care, Joint mobilization, Spinal mobilization, Moist heat, Taping, and Manual therapy, dry needling.   PLAN FOR NEXT SESSION: See clinical impression for plan     GOALS: Goals reviewed with patient? Yes  SHORT TERM GOALS: Target date: 05/01/2023    Pt will demo IND with HEP                    Baseline: Not IND            Goal status: INITIAL   LONG TERM GOALS: Target date: 06/12/2023    1.Pt will demo proper deep core coordination without chest breathing and optimal excursion of diaphragm/pelvic floor in order to promote spinal stability and pelvic floor function  Baseline: dyscoordination Goal status: INITIAL  2.  Pt will demo > 5 pt change on FOTO  to improve QOL and function    Urinary Problem baseline-   59  Higher score = better function    Lumber baseline  - 55 Higher score = better function   Goal status: INITIAL  3.  Pt will  demo proper body mechanics in against gravity tasks and ADLs  work tasks, fitness  to minimize straining pelvic floor / back                  Baseline: not IND, improper form that places strain on pelvic floor                Goal status: INITIAL    4. Pt will demo levelled pelvic girdle and shoulder height in order to progress to deep core strengthening HEP and restore mobility at spine, pelvis, gait, posture   Baseline: L shoulder and iliac crest higher  Goal status: INITIAL    5. Pt will report no more N/T upon waking in there B hands in order to improve ADL and QOL  Baseline: Pt has tingling and numbness in both her arms and to index and middle fingers on R hand, and pinky on the L hand in the morning.  Goal status: INITIAL   6. Pt will demo increased gait speed > 1.3 m/s with reciprocal gait  in order to ambulate safely in community and return to fitness routine  Baseline: short strides minimal hip flexion,push  off, decreased stance on R, 1.10 m/s  Goal status: INITIAL      Mariane Masters, PT 04/23/2023, 1:33 PM

## 2023-05-01 ENCOUNTER — Ambulatory Visit: Payer: Medicaid Other | Admitting: Physical Therapy

## 2023-05-01 DIAGNOSIS — M2170 Unequal limb length (acquired), unspecified site: Secondary | ICD-10-CM

## 2023-05-01 DIAGNOSIS — R2689 Other abnormalities of gait and mobility: Secondary | ICD-10-CM

## 2023-05-01 DIAGNOSIS — M533 Sacrococcygeal disorders, not elsewhere classified: Secondary | ICD-10-CM

## 2023-05-01 DIAGNOSIS — M5459 Other low back pain: Secondary | ICD-10-CM

## 2023-05-01 DIAGNOSIS — R200 Anesthesia of skin: Secondary | ICD-10-CM

## 2023-05-02 NOTE — Therapy (Addendum)
OUTPATIENT PHYSICAL THERAPY Treatment    Patient Name: Nicole Walter MRN: [id number] DOB:1961-07-25, 62 y.o., female Today's Date:05/01/23   PT End of Session - 05/01/23    Visit Number 3    Number of Visits 10    Date for PT Re-Evaluation 06/12/23    Authorization Type Medicaid    PT Start Time 1330    PT Stop Time 1415    PT Time Calculation (min) 45 min    Activity Tolerance Patient tolerated treatment well;No increased pain    Behavior During Therapy [facility] for tasks assessed/performed               Past Medical History:  Diagnosis Date   Abnormal liver enzymes 02/08/2016   Allergy    Anxiety    Chest pain 07/05/2016   Chronic pain    Depression    DJD (degenerative joint disease)    lumbar   Fibromyalgia    High cholesterol    Rash of back 01/28/2016   Weight loss due to medication    Past Surgical History:  Procedure Laterality Date   ANKLE SURGERY     x 2   BREAST BIOPSY Left 1997   CHOLECYSTECTOMY     COLONOSCOPY     COLONOSCOPY WITH PROPOFOL N/A 02/22/2016   Procedure: COLONOSCOPY WITH PROPOFOL;  Surgeon: Christena Deem, MD;  Location: [facility] ENDOSCOPY;  Service: Endoscopy;  Laterality: N/A;   ESOPHAGOGASTRODUODENOSCOPY (EGD) WITH PROPOFOL N/A 02/22/2016   Procedure: ESOPHAGOGASTRODUODENOSCOPY (EGD) WITH PROPOFOL;  Surgeon: Christena Deem, MD;  Location: [facility] ENDOSCOPY;  Service: Endoscopy;  Laterality: N/A;   NASAL SINUS SURGERY     SHOULDER ARTHROSCOPY WITH ROTATOR CUFF REPAIR AND SUBACROMIAL DECOMPRESSION Left 09/23/2019   Procedure: SHOULDER ARTHROSCOPY WITH MINI ROTATOR CUFF REPAIR AND SUBACROMIAL DECOMPRESSION, BICEPS TENODESIS;  Surgeon: Signa Kell, MD;  Location: [facility] SURGERY CNTR;  Service: Orthopedics;  Laterality: Left;   TONSILLECTOMY     TUBAL LIGATION     Patient Active Problem List   Diagnosis Date Noted   Chronic radicular lumbar pain 03/20/2023   Exposure to sexually transmitted disease (STD) 01/25/2023   Acute vaginitis  01/25/2023   Abnormal cervical Papanicolaou smear 01/25/2023   Abnormal MRI, cervical spine (09/04/2017) 07/22/2019   Neurogenic pain 07/22/2019   DDD (degenerative disc disease), cervical 06/09/2019   Chronic musculoskeletal pain 05/14/2019   Trypanophobia (Needle phobia) 09/26/2018   Spondylosis without myelopathy or radiculopathy, lumbar region 08/29/2018   Lumbar facet hypertrophy 08/29/2018   Lumbar facet syndrome (Right) 08/29/2018   DDD (degenerative disc disease), thoracic 08/29/2018   Thoracic spondylosis with radiculopathy (Right) 08/29/2018   Spinal enthesopathy of lumbar region (HCC) 08/29/2018   Chronic midline low back pain without sciatica 08/29/2018   Generalized anxiety disorder 07/23/2018   Cervical facet hypertrophy (Bilateral) 07/18/2018   Cervical central spinal stenosis 07/18/2018   Chronic shoulder pain (Left) 07/09/2018   Cervicalgia (Bilateral) (L>R) 03/28/2018   Spondylosis without myelopathy or radiculopathy, cervical region 02/11/2018   Chronic pelvic pain in female 08/24/2017   Fatty liver 08/24/2017   Chronic low back pain (Bilateral) (L>R) 08/13/2017   Cervical Foraminal Stenosis (Severe) (Bilateral: C5-6) 08/13/2017   Cervical spondylosis with radiculopathy (Bilateral) (L>R) 08/13/2017   Cervical sensory radiculopathy at C5 (Bilateral) 08/13/2017   Cervical radicular pain 08/13/2017   Constipation 07/16/2017   Myofascial pain syndrome, cervical (trapezius) (Left) 05/28/2017   Cervical facet syndrome (Bilateral) (L>R) 05/28/2017   Muscle spasm 05/15/2017   Chronic hip pain (Right) 03/19/2017  Chronic knee arthropathy (Right) 03/14/2017   Osteoarthritis of knee (Right) 02/21/2017   Osteoarthritis of shoulder (Right) 01/31/2017   Shortness of breath 01/15/2017   Thoracic spondylosis 01/01/2017   Radicular pain of thoracic region 12/21/2016   Osteoarthritis of hip (Bilateral) 11/20/2016   Greater trochanteric bursitis (Right) 09/18/2016    Subacromial bursitis of shoulder joint (Right) 09/18/2016   Overweight (BMI 25.0-29.9) 09/14/2016   Elevated sedimentation rate 08/31/2016   Elevated C-reactive protein (CRP) 08/31/2016   Long term current use of opiate analgesic 07/20/2016   Long term prescription opiate use 07/20/2016   Opiate use 07/20/2016   Chronic upper back pain (Secondary Area of Pain) (Bilateral) (L>R) 07/20/2016   Chronic shoulder pain (Tertiary Area of Pain) (Bilateral) (L>R) 07/20/2016   Chronic knee pain (Right) 07/20/2016   Chronic upper extremity pain (Left) 07/20/2016   Chronic lower extremity pain (Bilateral) (L>R) 07/20/2016   Chronic abdominal pain 07/20/2016   Chronic hip pain (Bilateral) (R>L) 05/31/2016   Chronic Greater trochanteric bursitis (Bilateral) (L>R) 05/31/2016   Bursitis of shoulder (Right) 05/17/2016   Skin lesions 04/27/2016   Hyperlipidemia 02/18/2016   Cervical spondylosis (Bilateral) 02/08/2016   Fibromyalgia 02/08/2016   Osteopenia 02/08/2016   Chronic neck pain (Primary Area of Pain) (Bilateral) (L>R) 01/28/2016   Concentration deficit 01/28/2016   Special screening for malignant neoplasms, vagina 01/14/2016   Lumbar spondylosis 01/06/2016   Anxiety and depression 07/11/2015   Chronic pain syndrome 07/11/2015   Combined fat and carbohydrate induced hyperlipemia 07/11/2015    PCP: Ellsworth Lennox   REFERRING PROVIDER:    [name] ( urge incontinence)   [name] ( cervical , thoracic, LBP, chronic pain)   REFERRING DIAG: Urge incontinence  Rationale for Evaluation and Treatment Rehabilitation  THERAPY DIAG:  Sacrococcygeal disorders, not elsewhere classified  Other abnormalities of gait and mobility  Other low back pain  Leg length difference, acquired  Numbness in both hands  ONSET DATE:   SUBJECTIVE:            SUBJECTIVE STATEMENT :  Pt has not been wearing her shoe lift due to discomfort. Pt's feels R upper LBP today                                                                                                                                                                 SUBJECTIVE STATEMENT ON EVAL 04/03/23  : 1) Urge incontinence and SUI  : 3 out of 10 times, pt has had leakage before making it to the bathroom . Changes 2 pads per day.    2) neck pain, midback, R LBP:  From sitting position, pt feels there is locking from her R hip and low back. Pt had been provided a shoe lift 25 years ago and she used it  for 5 years and it helped with pressure. Pt has not worn one since and was not given information about where to get more shoe lifts. Pt has no radiating pain currently. Pt feels she is leaning to the R. It takes her a few minute to stretch her back after sitting or driving   Pt has tingling and numbness in both her arms and to index and middle fingers on R hand, and pinky on the L hand in the morning. N/T does not occur with other activities.   PERTINENT HISTORY:  Tubal ligation,  L shoulder surgery, 2 surgeries on L ankle, 2 vaginal deliveries with vacuum and forceps   PAIN:  Are you having pain? Yes: see above   PRECAUTIONS: None  WEIGHT BEARING RESTRICTIONS: No  FALLS:  Has patient fallen in last 6 months? No  LIVING ENVIRONMENT: Lives with: lives alone Lives in: House/apartment Stairs: No  OCCUPATION: caregiving and cleaning   PLOF: Independent  PATIENT GOALS:   Would like to stop hurting and return to physical activities     OBJECTIVE:   [facility] PT Assessment -       Palpation   SI assessment  levelled pelvic girdle ( pt has not been wearing shoe lift due to discomfort), R shoulder higher    Palpation comment hypomobile and deviated C/T junction to L , deviated R at T3              [facility] Adult PT Treatment/Exercise       Moist Heat Therapy   Number Minutes Moist Heat 5 Minutes    Moist Heat Location Cervical        Manual Therapy   Other Manual Therapy STM/.MWM at problem areas noted in assessment to  realign spine, mobile cervical thoracic L               HOME EXERCISE PROGRAM: See pt instruction section    ASSESSMENT:  CLINICAL IMPRESSION:  Pt showed levelled pelvic alignment but R shoulder was higher today. She has not been wearing her shoe lift. Holding off on shoe lift and hypothesizing deviations in spine needs to be addressed. Pt may not have true LLD.   Pt showed shoulder alignment after manual Tx. Pt reports decreased LBP on R after receiving manual Tx that addressed deviated and hypomobile C/T segments.    Regional interdependent approaches will yield greater benefits. Plan to add cervicoscapular/ thoracolumbar strengthening and then progress to deep core training at next sessions.   Pt benefits from skilled PT.    OBJECTIVE IMPAIRMENTS decreased activity tolerance, decreased coordination, decreased endurance, decreased mobility, difficulty walking, decreased ROM, decreased strength, decreased safety awareness, hypomobility, increased muscle spasms, impaired flexibility, improper body mechanics, postural dysfunction, and pain. scar restrictions   ACTIVITY LIMITATIONS  self-care,  sleep, home chores, work tasks    PARTICIPATION LIMITATIONS:  community, gym activities    PERSONAL FACTORS       are also affecting patient's functional outcome.    REHAB POTENTIAL: Good   CLINICAL DECISION MAKING: Evolving/moderate complexity   EVALUATION COMPLEXITY: Moderate    PATIENT EDUCATION:    Education details: Showed pt anatomy images. Explained muscles attachments/ connection, physiology of deep core system/ spinal- thoracic-pelvis-lower kinetic chain as they relate to pt's presentation, Sx, and past Hx. Explained what and how these areas of deficits need to be restored to balance and function    See Therapeutic activity / neuromuscular re-education section  Answered pt's questions.   Person educated: Patient  Education method: Explanation, Demonstration, Tactile  cues, Verbal cues, and Handouts Education comprehension: verbalized understanding, returned demonstration, verbal cues required, tactile cues required, and needs further education     PLAN: PT FREQUENCY: 1x/week   PT DURATION: 10 weeks   PLANNED INTERVENTIONS: Therapeutic exercises, Therapeutic activity, Neuromuscular re-education, Balance training, Gait training, Patient/Family education, Self Care, Joint mobilization, Spinal mobilization, Moist heat, Taping, and Manual therapy, dry needling.   PLAN FOR NEXT SESSION: See clinical impression for plan     GOALS: Goals reviewed with patient? Yes  SHORT TERM GOALS: Target date: 05/01/2023    Pt will demo IND with HEP                    Baseline: Not IND            Goal status: INITIAL   LONG TERM GOALS: Target date: 06/12/2023    1.Pt will demo proper deep core coordination without chest breathing and optimal excursion of diaphragm/pelvic floor in order to promote spinal stability and pelvic floor function  Baseline: dyscoordination Goal status: INITIAL  2.  Pt will demo > 5 pt change on FOTO  to improve QOL and function    Urinary Problem baseline-   59  Higher score = better function    Lumber baseline  - 55 Higher score = better function   Goal status: INITIAL  3.  Pt will demo proper body mechanics in against gravity tasks and ADLs  work tasks, fitness  to minimize straining pelvic floor / back                  Baseline: not IND, improper form that places strain on pelvic floor                Goal status: INITIAL    4. Pt will demo levelled pelvic girdle and shoulder height in order to progress to deep core strengthening HEP and restore mobility at spine, pelvis, gait, posture   Baseline: L shoulder and iliac crest higher  Goal status: INITIAL    5. Pt will report no more N/T upon waking in there B hands in order to improve ADL and QOL  Baseline: Pt has tingling and numbness in both her arms and to index  and middle fingers on R hand, and pinky on the L hand in the morning.  Goal status: INITIAL   6. Pt will demo increased gait speed > 1.3 m/s with reciprocal gait  in order to ambulate safely in community and return to fitness routine  Baseline: short strides minimal hip flexion,push off, decreased stance on R, 1.10 m/s  Goal status: INITIAL      Mariane Masters, PT 05/02/2023, 12:28 PM

## 2023-05-11 ENCOUNTER — Emergency Department
Admission: EM | Admit: 2023-05-11 | Discharge: 2023-05-11 | Disposition: A | Discharge status: Home / Self Care | Payer: Medicaid Other | Attending: Emergency Medicine | Admitting: Emergency Medicine

## 2023-05-11 ENCOUNTER — Other Ambulatory Visit: Payer: Self-pay

## 2023-05-11 DIAGNOSIS — J0100 Acute maxillary sinusitis, unspecified: Secondary | ICD-10-CM

## 2023-05-11 DIAGNOSIS — R519 Headache, unspecified: Secondary | ICD-10-CM | POA: Diagnosis present

## 2023-05-11 LAB — CBC
HCT: 40.7 % (ref 36.0–46.0)
Hemoglobin: 13.6 g/dL (ref 12.0–15.0)
MCH: 29.3 pg (ref 26.0–34.0)
MCHC: 33.4 g/dL (ref 30.0–36.0)
MCV: 87.7 fL (ref 80.0–100.0)
Platelets: 276 10*3/uL (ref 150–400)
RBC: 4.64 MIL/uL (ref 3.87–5.11)
RDW: 12.8 % (ref 11.5–15.5)
WBC: 9.5 10*3/uL (ref 4.0–10.5)
nRBC: 0 % (ref 0.0–0.2)

## 2023-05-11 LAB — BASIC METABOLIC PANEL
Anion gap: 7 (ref 5–15)
BUN: 12 mg/dL (ref 8–23)
CO2: 26 mmol/L (ref 22–32)
Calcium: 9.5 mg/dL (ref 8.9–10.3)
Chloride: 104 mmol/L (ref 98–111)
Creatinine, Ser: 0.72 mg/dL (ref 0.44–1.00)
GFR, Estimated: >60 mL/min (ref >60)
Glucose, Bld: 95 mg/dL (ref 70–99)
Potassium: 3.9 mmol/L (ref 3.5–5.1)
Sodium: 137 mmol/L (ref 135–145)

## 2023-05-11 MED ORDER — TRAMADOL HCL 50 MG PO TABS: 50.0000 mg | ORAL_TABLET | Freq: Four times a day (QID) | ORAL | 0 refills | Status: DC | PRN

## 2023-05-11 MED ORDER — AMOXICILLIN-POT CLAVULANATE 875-125 MG PO TABS: 1.0000 | ORAL_TABLET | Freq: Two times a day (BID) | ORAL | 0 refills | Status: AC

## 2023-05-11 NOTE — ED Notes (Signed)
Nicole Walter with Registration at bedside.

## 2023-05-11 NOTE — ED Notes (Signed)
NT at bedside assisting pt.  

## 2023-05-11 NOTE — ED Triage Notes (Signed)
Pt to ED for left sided facial pain and swelling for the past couple days. Reports thought she had sinus infection but upper left teeth also hurt. Swelling noted to left cheek. No swelling noted to tongue or lips. RR even and unlabored, speaking in complete sentences.

## 2023-05-11 NOTE — ED Notes (Signed)
Pt d/c by [employer] at [facility] verbal request. This RN had not yet gotten to assess pt.

## 2023-05-11 NOTE — ED Provider Notes (Signed)
   [facility] Provider Note    Event Date/Time   First MD Initiated Contact with Patient 05/11/23 1213     (approximate)   History   Facial Pain   HPI  Nicole Walter is a 62 y.o. female who presents with complaints of left-sided facial swelling which she noticed this morning.  She reports she is having some discomfort in her upper left jaw/left sinus.  She reports a history of sinusitis in the past.  No fevers or chills.     Physical Exam   Triage Vital Signs: ED Triage Vitals  Enc Vitals Group     BP 05/11/23 1022 (!) 175/67     Pulse Rate 05/11/23 1022 70     Resp 05/11/23 1022 18     Temp 05/11/23 1022 98.6 F (37 C)     Temp src --      SpO2 05/11/23 1022 100 %     Weight 05/11/23 1023 74.8 kg (165 lb)     Height 05/11/23 1023 1.626 m (5\' 4" )     Head Circumference --      Peak Flow --      Pain Score 05/11/23 1023 10     Pain Loc --      Pain Edu? --      Excl. in GC? --     Most recent vital signs: Vitals:   05/11/23 1022  BP: (!) 175/67  Pulse: 70  Resp: 18  Temp: 98.6 F (37 C)  SpO2: 100%     General: Awake, no distress.  CV:  Good peripheral perfusion.  Resp:  Normal effort.  Abd:  No distention.  Other:  Mild left facial swelling, no dental abscess or evidence of severe dental infection   ED Results / Procedures / Treatments   Labs (all labs ordered are listed, but only abnormal results are displayed) Labs Reviewed  CBC  BASIC METABOLIC PANEL     EKG     RADIOLOGY     PROCEDURES:  Critical Care performed:   Procedures   MEDICATIONS ORDERED IN ED: Medications - No data to display   IMPRESSION / MDM / ASSESSMENT AND PLAN / ED COURSE  I reviewed the triage vital signs and the nursing notes. Patient's presentation is most consistent with acute illness / injury with system symptoms.  Patient presents with facial swelling as detailed above, suspicious for swelling laded to dental infection  versus possible sinusitis  Lab work reviewed and is overall unremarkable.  Will treat with Augmentin, outpatient follow-up if no improvement, return precautions if worsening.        FINAL CLINICAL IMPRESSION(S) / ED DIAGNOSES   Final diagnoses:  Acute maxillary sinusitis, recurrence not specified     Rx / DC Orders   ED Discharge Orders          Ordered    amoxicillin-clavulanate (AUGMENTIN) 875-125 MG tablet  2 times daily        05/11/23 1238    traMADol (ULTRAM) 50 MG tablet  Every 6 hours PRN        05/11/23 1238             Note:  This document was prepared using Dragon voice recognition software and may include unintentional dictation errors.   Jene Every, MD 05/11/23 [phone]

## 2023-05-15 ENCOUNTER — Ambulatory Visit: Payer: Medicaid Other | Admitting: Internal Medicine

## 2023-05-15 VITALS — BP 150/78 | HR 72 | Ht 64.0 in | Wt 159.0 lb

## 2023-05-15 DIAGNOSIS — J0100 Acute maxillary sinusitis, unspecified: Secondary | ICD-10-CM | POA: Diagnosis not present

## 2023-05-15 DIAGNOSIS — B370 Candidal stomatitis: Secondary | ICD-10-CM | POA: Diagnosis not present

## 2023-05-15 MED ORDER — NYSTATIN 100000 UNIT/ML MT SUSP
5.0000 mL | Freq: Four times a day (QID) | OROMUCOSAL | 0 refills | Status: DC
Start: 2023-05-15 — End: 2023-05-17

## 2023-05-15 NOTE — Progress Notes (Signed)
Established Patient Office Visit  Subjective:  Patient ID: Nicole Walter, female    DOB: 15-Apr-1961  Age: 62 y.o. MRN: [id number]  Chief Complaint  Patient presents with   Follow-up    Hospital Follow Up    ER follow up for acute Maxillary sinusitis. Placed on atbs with improvement in pain and swelling. Today she c/o oral thrush.    No other concerns at this time.   Past Medical History:  Diagnosis Date   Abnormal liver enzymes 02/08/2016   Allergy    Anxiety    Chest pain 07/05/2016   Chronic pain    Depression    DJD (degenerative joint disease)    lumbar   Fibromyalgia    High cholesterol    Rash of back 01/28/2016   Weight loss due to medication     Past Surgical History:  Procedure Laterality Date   ANKLE SURGERY     x 2   BREAST BIOPSY Left 1997   CHOLECYSTECTOMY     COLONOSCOPY     COLONOSCOPY WITH PROPOFOL N/A 02/22/2016   Procedure: COLONOSCOPY WITH PROPOFOL;  Surgeon: Christena Deem, MD;  Location: [facility] ENDOSCOPY;  Service: Endoscopy;  Laterality: N/A;   ESOPHAGOGASTRODUODENOSCOPY (EGD) WITH PROPOFOL N/A 02/22/2016   Procedure: ESOPHAGOGASTRODUODENOSCOPY (EGD) WITH PROPOFOL;  Surgeon: Christena Deem, MD;  Location: [facility] ENDOSCOPY;  Service: Endoscopy;  Laterality: N/A;   NASAL SINUS SURGERY     SHOULDER ARTHROSCOPY WITH ROTATOR CUFF REPAIR AND SUBACROMIAL DECOMPRESSION Left 09/23/2019   Procedure: SHOULDER ARTHROSCOPY WITH MINI ROTATOR CUFF REPAIR AND SUBACROMIAL DECOMPRESSION, BICEPS TENODESIS;  Surgeon: Signa Kell, MD;  Location: [facility];  Service: Orthopedics;  Laterality: Left;   TONSILLECTOMY     TUBAL LIGATION      Social History   Socioeconomic History   Marital status: Legally Separated    Spouse name: Not on file   Number of children: Not on file   Years of education: Not on file   Highest education level: Not on file  Occupational History   Not on file  Tobacco Use   Smoking status: Every Day    Packs/day: 1.00     Years: 35.00    Additional pack years: 0.00    Total pack years: 35.00    Types: E-cigarettes, Cigarettes    Start date: 03/06/1974    Last attempt to quit: 07/08/2015    Years since quitting: 7.8   Smokeless tobacco: Never   Tobacco comments:    vapor cigarettes, no nicotene  Vaping Use   Vaping Use: Every day   Start date: 07/08/2015   Substances: Flavoring   Devices: [employer]  Substance and Sexual Activity   Alcohol use: No    Alcohol/week: 0.0 standard drinks of alcohol   Drug use: No   Sexual activity: Not Currently    Birth control/protection: Post-menopausal  Other Topics Concern   Not on file  Social History Narrative   Not on file   Social Determinants of Health   Financial Resource Strain: Not on file  Food Insecurity: Not on file  Transportation Needs: Not on file  Physical Activity: Not on file  Stress: Not on file  Social Connections: Not on file  Intimate Partner Violence: Not on file    Family History  Problem Relation Age of Onset   Heart disease Mother    Stroke Mother    Cancer Father        lung   Arthritis Father  Arthritis Paternal Grandmother    Arthritis Paternal Grandfather    Drug abuse Sister    Drug abuse Brother    Post-traumatic stress disorder Brother    Diabetes Sister    Dementia Sister    Drug abuse Brother    Cancer Brother     Allergies  Allergen Reactions   Other Hives and Other (See Comments)    Pt states she was tested for allergies and peas was one that she is allergic to but has never ate enough of them to see a reaction. Pt states when she is around bird feathers she gets the hives FEATHERS   Adhesive [Tape] Hives   Food     PEAS/ patient does not know what type reaction she has/ from allergy test during childhood   Pea Other (See Comments)    Other reaction(Ciela Mahajan): Unknown.  Pt states she was tested for allergies and peas was one that she is allergic to but has never ate enough of them to see a reaction.    Review  of Systems  Constitutional: Negative.        Objective:   BP (!) 150/78   Pulse 72   Ht 5\' 4"  (1.626 m)   Wt 159 lb (72.1 kg)   SpO2 97%   BMI 27.29 kg/m   Vitals:   05/15/23 1106  BP: (!) 150/78  Pulse: 72  Height: 5\' 4"  (1.626 m)  Weight: 159 lb (72.1 kg)  SpO2: 97%  BMI (Calculated): 27.28    Physical Exam Vitals reviewed.  Constitutional:      General: She is not in acute distress. HENT:     Head: Normocephalic.     Comments: Tender swelling upper left gum. Left maxillary sinus tenderness.    Nose: Nose normal.     Mouth/Throat:     Mouth: Mucous membranes are moist.  Eyes:     Extraocular Movements: Extraocular movements intact.     Pupils: Pupils are equal, round, and reactive to light.  Cardiovascular:     Rate and Rhythm: Normal rate and regular rhythm.     Heart sounds: No murmur heard. Pulmonary:     Effort: Pulmonary effort is normal.     Breath sounds: No rhonchi or rales.  Abdominal:     General: Abdomen is flat.     Palpations: There is no hepatomegaly, splenomegaly or mass.  Musculoskeletal:        General: Normal range of motion.     Cervical back: Normal range of motion. No tenderness.  Skin:    General: Skin is warm and dry.  Neurological:     General: No focal deficit present.     Mental Status: She is alert and oriented to person, place, and time.     Cranial Nerves: No cranial nerve deficit.     Motor: No weakness.  Psychiatric:        Mood and Affect: Mood normal.        Behavior: Behavior normal.      No results found for any visits on 05/15/23.      Assessment & Plan:  As per problem list.  Problem List Items Addressed This Visit       Respiratory   Acute maxillary sinusitis - Primary     Digestive   Oral thrush   Relevant Medications   nystatin (MYCOSTATIN) 100000 UNIT/ML suspension    Return if symptoms worsen or fail to improve.   Total time spent: 20 minutes  Luna Fuse,  MD  05/15/2023   This document may have been prepared by [facility] Voice Recognition software and as such may include unintentional dictation errors.

## 2023-05-16 ENCOUNTER — Ambulatory Visit: Payer: Medicaid Other | Attending: Internal Medicine | Admitting: Physical Therapy

## 2023-05-16 DIAGNOSIS — R200 Anesthesia of skin: Secondary | ICD-10-CM | POA: Diagnosis present

## 2023-05-16 DIAGNOSIS — M5459 Other low back pain: Secondary | ICD-10-CM | POA: Insufficient documentation

## 2023-05-16 DIAGNOSIS — M533 Sacrococcygeal disorders, not elsewhere classified: Secondary | ICD-10-CM | POA: Insufficient documentation

## 2023-05-16 DIAGNOSIS — M2170 Unequal limb length (acquired), unspecified site: Secondary | ICD-10-CM | POA: Diagnosis present

## 2023-05-16 DIAGNOSIS — R2689 Other abnormalities of gait and mobility: Secondary | ICD-10-CM | POA: Insufficient documentation

## 2023-05-16 NOTE — Therapy (Addendum)
OUTPATIENT PHYSICAL THERAPY Treatment    Patient Name: Nicole Walter MRN: [id number] DOB:1961-01-18, 62 y.o., female Today's Date:05/01/23   PT End of Session -05/16/23    Visit Number 4    Number of Visits 10    Date for PT Re-Evaluation 06/12/23    Authorization Type Medicaid    PT Start Time 1331   PT Stop Time 1409   PT Time Calculation (min) 38  min    Activity Tolerance Patient tolerated treatment well;No increased pain    Behavior During Therapy [facility] for tasks assessed/performed               Past Medical History:  Diagnosis Date   Abnormal liver enzymes 02/08/2016   Allergy    Anxiety    Chest pain 07/05/2016   Chronic pain    Depression    DJD (degenerative joint disease)    lumbar   Fibromyalgia    High cholesterol    Rash of back 01/28/2016   Weight loss due to medication    Past Surgical History:  Procedure Laterality Date   ANKLE SURGERY     x 2   BREAST BIOPSY Left 1997   CHOLECYSTECTOMY     COLONOSCOPY     COLONOSCOPY WITH PROPOFOL N/A 02/22/2016   Procedure: COLONOSCOPY WITH PROPOFOL;  Surgeon: Christena Deem, MD;  Location: [facility] ENDOSCOPY;  Service: Endoscopy;  Laterality: N/A;   ESOPHAGOGASTRODUODENOSCOPY (EGD) WITH PROPOFOL N/A 02/22/2016   Procedure: ESOPHAGOGASTRODUODENOSCOPY (EGD) WITH PROPOFOL;  Surgeon: Christena Deem, MD;  Location: [facility] ENDOSCOPY;  Service: Endoscopy;  Laterality: N/A;   NASAL SINUS SURGERY     SHOULDER ARTHROSCOPY WITH ROTATOR CUFF REPAIR AND SUBACROMIAL DECOMPRESSION Left 09/23/2019   Procedure: SHOULDER ARTHROSCOPY WITH MINI ROTATOR CUFF REPAIR AND SUBACROMIAL DECOMPRESSION, BICEPS TENODESIS;  Surgeon: Signa Kell, MD;  Location: [facility]ERY [facility];  Service: Orthopedics;  Laterality: Left;   TONSILLECTOMY     TUBAL LIGATION     Patient Active Problem List   Diagnosis Date Noted   Oral thrush 05/15/2023   Acute maxillary sinusitis 05/15/2023   Chronic radicular lumbar pain 03/20/2023   Exposure to sexually  transmitted disease (STD) 01/25/2023   Acute vaginitis 01/25/2023   Abnormal cervical Papanicolaou smear 01/25/2023   Abnormal MRI, cervical spine (09/04/2017) 07/22/2019   Neurogenic pain 07/22/2019   DDD (degenerative disc disease), cervical 06/09/2019   Chronic musculoskeletal pain 05/14/2019   Trypanophobia (Needle phobia) 09/26/2018   Spondylosis without myelopathy or radiculopathy, lumbar region 08/29/2018   Lumbar facet hypertrophy 08/29/2018   Lumbar facet syndrome (Right) 08/29/2018   DDD (degenerative disc disease), thoracic 08/29/2018   Thoracic spondylosis with radiculopathy (Right) 08/29/2018   Spinal enthesopathy of lumbar region (HCC) 08/29/2018   Chronic midline low back pain without sciatica 08/29/2018   Generalized anxiety disorder 07/23/2018   Cervical facet hypertrophy (Bilateral) 07/18/2018   Cervical central spinal stenosis 07/18/2018   Chronic shoulder pain (Left) 07/09/2018   Cervicalgia (Bilateral) (L>R) 03/28/2018   Spondylosis without myelopathy or radiculopathy, cervical region 02/11/2018   Chronic pelvic pain in female 08/24/2017   Fatty liver 08/24/2017   Chronic low back pain (Bilateral) (L>R) 08/13/2017   Cervical Foraminal Stenosis (Severe) (Bilateral: C5-6) 08/13/2017   Cervical spondylosis with radiculopathy (Bilateral) (L>R) 08/13/2017   Cervical sensory radiculopathy at C5 (Bilateral) 08/13/2017   Cervical radicular pain 08/13/2017   Constipation 07/16/2017   Myofascial pain syndrome, cervical (trapezius) (Left) 05/28/2017   Cervical facet syndrome (Bilateral) (L>R) 05/28/2017   Muscle spasm 05/15/2017  Chronic hip pain (Right) 03/19/2017   Chronic knee arthropathy (Right) 03/14/2017   Osteoarthritis of knee (Right) 02/21/2017   Osteoarthritis of shoulder (Right) 01/31/2017   Shortness of breath 01/15/2017   Thoracic spondylosis 01/01/2017   Radicular pain of thoracic region 12/21/2016   Osteoarthritis of hip (Bilateral) 11/20/2016    Greater trochanteric bursitis (Right) 09/18/2016   Subacromial bursitis of shoulder joint (Right) 09/18/2016   Overweight (BMI 25.0-29.9) 09/14/2016   Elevated sedimentation rate 08/31/2016   Elevated C-reactive protein (CRP) 08/31/2016   Long term current use of opiate analgesic 07/20/2016   Long term prescription opiate use 07/20/2016   Opiate use 07/20/2016   Chronic upper back pain (Secondary Area of Pain) (Bilateral) (L>R) 07/20/2016   Chronic shoulder pain (Tertiary Area of Pain) (Bilateral) (L>R) 07/20/2016   Chronic knee pain (Right) 07/20/2016   Chronic upper extremity pain (Left) 07/20/2016   Chronic lower extremity pain (Bilateral) (L>R) 07/20/2016   Chronic abdominal pain 07/20/2016   Chronic hip pain (Bilateral) (R>L) 05/31/2016   Chronic Greater trochanteric bursitis (Bilateral) (L>R) 05/31/2016   Bursitis of shoulder (Right) 05/17/2016   Skin lesions 04/27/2016   Hyperlipidemia 02/18/2016   Cervical spondylosis (Bilateral) 02/08/2016   Fibromyalgia 02/08/2016   Osteopenia 02/08/2016   Chronic neck pain (Primary Area of Pain) (Bilateral) (L>R) 01/28/2016   Concentration deficit 01/28/2016   Special screening for malignant neoplasms, vagina 01/14/2016   Lumbar spondylosis 01/06/2016   Anxiety and depression 07/11/2015   Chronic pain syndrome 07/11/2015   Combined fat and carbohydrate induced hyperlipemia 07/11/2015    PCP: Ellsworth Lennox   REFERRING PROVIDER:    [name] ( urge incontinence)   [name] ( cervical , thoracic, LBP, chronic pain)   REFERRING DIAG: Urge incontinence  Rationale for Evaluation and Treatment Rehabilitation  THERAPY DIAG:  Sacrococcygeal disorders, not elsewhere classified  Other low back pain  Other abnormalities of gait and mobility  Leg length difference, acquired  Numbness in both hands  ONSET DATE:   SUBJECTIVE:            SUBJECTIVE STATEMENT :  Pt  R LBP felt better the past few days after taking antibiotics for her  sinus infection. Her neck felt better after last session. She was sore for a few days.   The pain from her belly button is not radiating down her R groin but it still painful. It tightened up.   SUBJECTIVE STATEMENT ON EVAL 04/03/23  : 1) Urge incontinence and SUI  : 3 out of 10 times, pt has had leakage before making it to the bathroom . Changes 2 pads per day.    2) neck pain, midback, R LBP:  From sitting position, pt feels there is locking from her R hip and low back. Pt had been provided a shoe lift 25 years ago and she used it for 5 years and it helped with pressure. Pt has not worn one since and was not given information about where to get more shoe lifts. Pt has no radiating pain currently. Pt feels she is leaning to the R. It takes her a few minute to stretch her back after sitting or driving   Pt has tingling and numbness in both her arms and to index and middle fingers on R hand, and pinky on the L hand in the morning. N/T does not occur with other activities.   PERTINENT HISTORY:  Tubal ligation,  L shoulder surgery, 2 surgeries on L ankle, 2 vaginal deliveries with vacuum and forceps  PAIN:  Are you having pain? Yes: see above   PRECAUTIONS: None  WEIGHT BEARING RESTRICTIONS: No  FALLS:  Has patient fallen in last 6 months? No  LIVING ENVIRONMENT: Lives with: lives alone Lives in: House/apartment Stairs: No  OCCUPATION: caregiving and cleaning   PLOF: Independent  PATIENT GOALS:   Would like to stop hurting and return to physical activities     OBJECTIVE:   [facility] PT Assessment - 05/16/23 1338       Coordination   Coordination and Movement Description perturbation of trunk with deep core      AROM   Overall AROM Comments L sidebend/ rotation WFL and no pain      Strength   Overall Strength Comments B hip flexion 4/5      Palpation   SI assessment  levelled pelvic girdle, shoulders B             [facility] Adult PT Treatment/Exercise - 05/16/23 1405        Therapeutic Activites    Other Therapeutic Activities body mechanics with log rolling and car      Neuro Re-ed    Neuro Re-ed Details  excessive cues for deep core training , log rolling to minimize LBP , getting into car with less LBP , stretches for back               HOME EXERCISE PROGRAM: See pt instruction section    ASSESSMENT:  CLINICAL IMPRESSION:  Pt showed milestone improvement with levelled pelvis and shoulder alignment. Pt regained R hip flexion strength as well. The pain from her belly button is not radiating down her R groin but it still painful. It tightened up.  Plan to assess umbilical pain at next session.   Today, initiated deep core training which will help with maintaining this improved alignment of thorax/ pelvis and help with her urinary issues and the umbilical pain which she reports to be tightness.   Provided excessive cues for log rolling/ getting into the car  to minimize LBP and cues for deep core technique.     Regional interdependent approaches will yield greater benefits. Plan to add cervicoscapular/ thoracolumbar strengthening.  Pt benefits from skilled PT.    OBJECTIVE IMPAIRMENTS decreased activity tolerance, decreased coordination, decreased endurance, decreased mobility, difficulty walking, decreased ROM, decreased strength, decreased safety awareness, hypomobility, increased muscle spasms, impaired flexibility, improper body mechanics, postural dysfunction, and pain. scar restrictions   ACTIVITY LIMITATIONS  self-care,  sleep, home chores, work tasks    PARTICIPATION LIMITATIONS:  community, gym activities    PERSONAL FACTORS       are also affecting patient's functional outcome.    REHAB POTENTIAL: Good   CLINICAL DECISION MAKING: Evolving/moderate complexity   EVALUATION COMPLEXITY: Moderate    PATIENT EDUCATION:    Education details: Showed pt anatomy images. Explained muscles attachments/ connection, physiology of  deep core system/ spinal- thoracic-pelvis-lower kinetic chain as they relate to pt's presentation, Sx, and past Hx. Explained what and how these areas of deficits need to be restored to balance and function    See Therapeutic activity / neuromuscular re-education section  Answered pt's questions.   Person educated: Patient Education method: Explanation, Demonstration, Tactile cues, Verbal cues, and Handouts Education comprehension: verbalized understanding, returned demonstration, verbal cues required, tactile cues required, and needs further education     PLAN: PT FREQUENCY: 1x/week   PT DURATION: 10 weeks   PLANNED INTERVENTIONS: Therapeutic exercises, Therapeutic activity, Neuromuscular re-education, Balance training, Gait training,  Patient/Family education, Self Care, Joint mobilization, Spinal mobilization, Moist heat, Taping, and Manual therapy, dry needling.   PLAN FOR NEXT SESSION: See clinical impression for plan     GOALS: Goals reviewed with patient? Yes  SHORT TERM GOALS: Target date: 05/01/2023    Pt will demo IND with HEP                    Baseline: Not IND            Goal status: INITIAL   LONG TERM GOALS: Target date: 06/12/2023    1.Pt will demo proper deep core coordination without chest breathing and optimal excursion of diaphragm/pelvic floor in order to promote spinal stability and pelvic floor function  Baseline: dyscoordination Goal status: INITIAL  2.  Pt will demo > 5 pt change on FOTO  to improve QOL and function    Urinary Problem baseline-   59  Higher score = better function    Lumber baseline  - 55 Higher score = better function   Goal status: INITIAL  3.  Pt will demo proper body mechanics in against gravity tasks and ADLs  work tasks, fitness  to minimize straining pelvic floor / back                  Baseline: not IND, improper form that places strain on pelvic floor                Goal status: INITIAL    4. Pt will demo  levelled pelvic girdle and shoulder height in order to progress to deep core strengthening HEP and restore mobility at spine, pelvis, gait, posture   Baseline: L shoulder and iliac crest higher  Goal status: INITIAL    5. Pt will report no more N/T upon waking in there B hands in order to improve ADL and QOL  Baseline: Pt has tingling and numbness in both her arms and to index and middle fingers on R hand, and pinky on the L hand in the morning.  Goal status: INITIAL   6. Pt will demo increased gait speed > 1.3 m/s with reciprocal gait  in order to ambulate safely in community and return to fitness routine  Baseline: short strides minimal hip flexion,push off, decreased stance on R, 1.10 m/s  Goal status: INITIAL      Mariane Masters, PT 05/16/2023, 1:35 PM

## 2023-05-16 NOTE — Patient Instructions (Addendum)
Stretches:  Knee chest  Scoot hips over , knees wobble   Deep core training (Handout)    ___  Avoid straining pelvic floor, abdominal muscles , spine  Use log rolling technique instead of getting out of bed with your neck or the sit-up     Log rolling into and out of bed   Log rolling into and out of bed If getting out of bed on R side, Bent knees, scoot hips/ shoulder to L  Raise R arm completely overhead, rolling onto armpit  Then lower bent knees to bed to get into complete side lying position  Then drop legs off bed, and push up onto R elbow/forearm, and use L hand to push onto the bed    Dig elbows and feet to lift hte buttocks and scoot without lifting head   __  Getting car with incremental movement of BLE   __

## 2023-05-17 ENCOUNTER — Other Ambulatory Visit: Payer: Self-pay

## 2023-05-17 DIAGNOSIS — B370 Candidal stomatitis: Secondary | ICD-10-CM

## 2023-05-17 MED ORDER — NYSTATIN 100000 UNIT/ML MT SUSP
5.0000 mL | Freq: Four times a day (QID) | OROMUCOSAL | 0 refills | Status: AC
Start: 2023-05-17 — End: 2023-05-22

## 2023-05-21 ENCOUNTER — Ambulatory Visit: Payer: Medicaid Other | Admitting: Physical Therapy

## 2023-05-21 ENCOUNTER — Ambulatory Visit: Payer: Medicaid Other | Admitting: Internal Medicine

## 2023-05-21 VITALS — BP 120/78 | HR 67 | Ht 64.0 in | Wt 160.0 lb

## 2023-05-21 DIAGNOSIS — J0100 Acute maxillary sinusitis, unspecified: Secondary | ICD-10-CM

## 2023-05-21 DIAGNOSIS — M533 Sacrococcygeal disorders, not elsewhere classified: Secondary | ICD-10-CM

## 2023-05-21 DIAGNOSIS — M2170 Unequal limb length (acquired), unspecified site: Secondary | ICD-10-CM

## 2023-05-21 DIAGNOSIS — M5441 Lumbago with sciatica, right side: Secondary | ICD-10-CM

## 2023-05-21 DIAGNOSIS — F32A Depression, unspecified: Secondary | ICD-10-CM

## 2023-05-21 DIAGNOSIS — R200 Anesthesia of skin: Secondary | ICD-10-CM

## 2023-05-21 DIAGNOSIS — R2689 Other abnormalities of gait and mobility: Secondary | ICD-10-CM

## 2023-05-21 DIAGNOSIS — M5442 Lumbago with sciatica, left side: Secondary | ICD-10-CM

## 2023-05-21 DIAGNOSIS — F419 Anxiety disorder, unspecified: Secondary | ICD-10-CM | POA: Diagnosis not present

## 2023-05-21 DIAGNOSIS — G8929 Other chronic pain: Secondary | ICD-10-CM

## 2023-05-21 DIAGNOSIS — M5459 Other low back pain: Secondary | ICD-10-CM

## 2023-05-21 MED ORDER — BUSPIRONE HCL 7.5 MG PO TABS
7.5000 mg | ORAL_TABLET | Freq: Two times a day (BID) | ORAL | 1 refills | Status: DC
Start: 2023-05-21 — End: 2023-06-20

## 2023-05-21 MED ORDER — OXYCODONE HCL 5 MG PO TABS
5.0000 mg | ORAL_TABLET | Freq: Four times a day (QID) | ORAL | 0 refills | Status: DC | PRN
Start: 2023-05-21 — End: 2023-06-20

## 2023-05-21 NOTE — Progress Notes (Signed)
Established Patient Office Visit  Subjective:  Patient ID: Nicole Walter, female    DOB: 1961/03/10  Age: 62 y.o. MRN: [id number]  Chief Complaint  Patient presents with   Follow-up    PM    Here for pain management follow up. Chronic pain well controlled on current analgesia. Last UDS satisfactory and pill counts have also been satisfactory. Facial pain and swelling have significantly improved.     No other concerns at this time.   Past Medical History:  Diagnosis Date   Abnormal liver enzymes 02/08/2016   Allergy    Anxiety    Chest pain 07/05/2016   Chronic pain    Depression    DJD (degenerative joint disease)    lumbar   Fibromyalgia    High cholesterol    Rash of back 01/28/2016   Weight loss due to medication     Past Surgical History:  Procedure Laterality Date   ANKLE SURGERY     x 2   BREAST BIOPSY Left 1997   CHOLECYSTECTOMY     COLONOSCOPY     COLONOSCOPY WITH PROPOFOL N/A 02/22/2016   Procedure: COLONOSCOPY WITH PROPOFOL;  Surgeon: Christena Deem, MD;  Location: [facility] ENDOSCOPY;  Service: Endoscopy;  Laterality: N/A;   ESOPHAGOGASTRODUODENOSCOPY (EGD) WITH PROPOFOL N/A 02/22/2016   Procedure: ESOPHAGOGASTRODUODENOSCOPY (EGD) WITH PROPOFOL;  Surgeon: Christena Deem, MD;  Location: [facility] ENDOSCOPY;  Service: Endoscopy;  Laterality: N/A;   NASAL SINUS SURGERY     SHOULDER ARTHROSCOPY WITH ROTATOR CUFF REPAIR AND SUBACROMIAL DECOMPRESSION Left 09/23/2019   Procedure: SHOULDER ARTHROSCOPY WITH MINI ROTATOR CUFF REPAIR AND SUBACROMIAL DECOMPRESSION, BICEPS TENODESIS;  Surgeon: Signa Kell, MD;  Location: [facility];  Service: Orthopedics;  Laterality: Left;   TONSILLECTOMY     TUBAL LIGATION      Social History   Socioeconomic History   Marital status: Legally Separated    Spouse name: Not on file   Number of children: Not on file   Years of education: Not on file   Highest education level: Not on file  Occupational History   Not on file   Tobacco Use   Smoking status: Every Day    Current packs/day: 0.00    Average packs/day: 1 pack/day for 41.3 years (41.3 ttl pk-yrs)    Types: E-cigarettes, Cigarettes    Start date: 03/06/1974    Last attempt to quit: 07/08/2015    Years since quitting: 7.8   Smokeless tobacco: Never   Tobacco comments:    vapor cigarettes, no nicotene  Vaping Use   Vaping status: Every Day   Start date: 07/08/2015   Substances: Flavoring   Devices: [employer]  Substance and Sexual Activity   Alcohol use: No    Alcohol/week: 0.0 standard drinks of alcohol   Drug use: No   Sexual activity: Not Currently    Birth control/protection: Post-menopausal  Other Topics Concern   Not on file  Social History Narrative   Not on file   Social Determinants of Health   Financial Resource Strain: Not on file  Food Insecurity: Not on file  Transportation Needs: Not on file  Physical Activity: Not on file  Stress: Not on file  Social Connections: Not on file  Intimate Partner Violence: Not on file    Family History  Problem Relation Age of Onset   Heart disease Mother    Stroke Mother    Cancer Father        lung   Arthritis Father  Arthritis Paternal Grandmother    Arthritis Paternal Grandfather    Drug abuse Sister    Drug abuse Brother    Post-traumatic stress disorder Brother    Diabetes Sister    Dementia Sister    Drug abuse Brother    Cancer Brother     Allergies  Allergen Reactions   Other Hives and Other (See Comments)    Pt states she was tested for allergies and peas was one that she is allergic to but has never ate enough of them to see a reaction. Pt states when she is around bird feathers she gets the hives FEATHERS   Adhesive [Tape] Hives   Food     PEAS/ patient does not know what type reaction she has/ from allergy test during childhood   Pea Other (See Comments)    Other reaction(Rajendra Spiller): Unknown.  Pt states she was tested for allergies and peas was one that she is allergic  to but has never ate enough of them to see a reaction.    Review of Systems  Constitutional: Negative.        Objective:   BP 120/78   Pulse 67   Ht 5\' 4"  (1.626 m)   Wt 160 lb (72.6 kg)   SpO2 99%   BMI 27.46 kg/m   Vitals:   05/21/23 1154  BP: 120/78  Pulse: 67  Height: 5\' 4"  (1.626 m)  Weight: 160 lb (72.6 kg)  SpO2: 99%  BMI (Calculated): 27.45    Physical Exam Vitals reviewed.  Constitutional:      General: She is not in acute distress. HENT:     Head: Normocephalic.     Nose: Nose normal.     Mouth/Throat:     Mouth: Mucous membranes are moist.  Eyes:     Extraocular Movements: Extraocular movements intact.     Pupils: Pupils are equal, round, and reactive to light.  Cardiovascular:     Rate and Rhythm: Normal rate and regular rhythm.     Heart sounds: No murmur heard. Pulmonary:     Effort: Pulmonary effort is normal.     Breath sounds: No rhonchi or rales.  Abdominal:     General: Abdomen is flat.     Palpations: There is no hepatomegaly, splenomegaly or mass.  Musculoskeletal:        General: Normal range of motion.     Cervical back: Normal range of motion. No tenderness.  Skin:    General: Skin is warm and dry.  Neurological:     General: No focal deficit present.     Mental Status: She is alert and oriented to person, place, and time.     Cranial Nerves: No cranial nerve deficit.     Motor: No weakness.  Psychiatric:        Mood and Affect: Mood normal.        Behavior: Behavior normal.      No results found for any visits on 05/21/23.  Recent Results (from the past 2160 hour(Shahad Mazurek))  CBC     Status: None   Collection Time: 05/11/23 10:24 AM  Result Value Ref Range   WBC 9.5 4.0 - 10.5 K/uL   RBC 4.64 3.87 - 5.11 MIL/uL   Hemoglobin 13.6 12.0 - 15.0 g/dL   HCT 14.7 82.9 - 56.2 %   MCV 87.7 80.0 - 100.0 fL   MCH 29.3 26.0 - 34.0 pg   MCHC 33.4 30.0 - 36.0 g/dL   RDW 12.8  11.5 - 15.5 %   Platelets 276 150 - 400 K/uL   nRBC 0.0  0.0 - 0.2 %    Comment: Performed at [facility], [address] [place], [place] [address]  Basic metabolic panel     Status: None   Collection Time: 05/11/23 10:24 AM  Result Value Ref Range   Sodium 137 135 - 145 mmol/L   Potassium 3.9 3.5 - 5.1 mmol/L   Chloride 104 98 - 111 mmol/L   CO2 26 22 - 32 mmol/L   Glucose, Bld 95 70 - 99 mg/dL    Comment: Glucose reference range applies only to samples taken after fasting for at least 8 hours.   BUN 12 8 - 23 mg/dL   Creatinine, Ser 6.04 0.44 - 1.00 mg/dL   Calcium 9.5 8.9 - 54.0 mg/dL   GFR, Estimated >98 >11 mL/min    Comment: (NOTE) Calculated using the CKD-EPI Creatinine Equation (2021)    Anion gap 7 5 - 15    Comment: Performed at [facility], [address], [place], [place] [address]      Assessment & Plan:   Problem List Items Addressed This Visit       Respiratory   Acute maxillary sinusitis     Nervous and Auditory   Chronic low back pain (Bilateral) (L>R) (Chronic)   Relevant Medications   oxyCODONE (OXY IR/ROXICODONE) 5 MG immediate release tablet     Other   Anxiety and depression - Primary    Return in about 1 month (around 06/21/2023).   Total time spent: 20 minutes  Luna Fuse, MD  05/21/2023   This document may have been prepared by [facility] Voice Recognition software and as such may include unintentional dictation errors.

## 2023-05-21 NOTE — Therapy (Addendum)
OUTPATIENT PHYSICAL THERAPY Treatment    Patient Name: Nicole Walter MRN: [id number] DOB:[birth year], 62 y.o., female Today's Date:05/01/23   PT End of Session - 05/21/23     Visit Number 5    Number of Visits 10    Date for PT Re-Evaluation 06/12/23    Authorization Type Medicaid    PT Start Time 1330    PT Stop Time 1415    PT Time Calculation (min) 45 min    Activity Tolerance Patient tolerated treatment well;No increased pain    Behavior During Therapy [facility] for tasks assessed/performed               Past Medical History:  Diagnosis Date   Abnormal liver enzymes 02/08/2016   Allergy    Anxiety    Chest pain 07/05/2016   Chronic pain    Depression    DJD (degenerative joint disease)    lumbar   Fibromyalgia    High cholesterol    Rash of back 01/28/2016   Weight loss due to medication    Past Surgical History:  Procedure Laterality Date   ANKLE SURGERY     x 2   BREAST BIOPSY Left 1997   CHOLECYSTECTOMY     COLONOSCOPY     COLONOSCOPY WITH PROPOFOL N/A 02/22/2016   Procedure: COLONOSCOPY WITH PROPOFOL;  Surgeon: Christena Deem, MD;  Location: [facility] ENDOSCOPY;  Service: Endoscopy;  Laterality: N/A;   ESOPHAGOGASTRODUODENOSCOPY (EGD) WITH PROPOFOL N/A 02/22/2016   Procedure: ESOPHAGOGASTRODUODENOSCOPY (EGD) WITH PROPOFOL;  Surgeon: Christena Deem, MD;  Location: [facility] ENDOSCOPY;  Service: Endoscopy;  Laterality: N/A;   NASAL SINUS SURGERY     SHOULDER ARTHROSCOPY WITH ROTATOR CUFF REPAIR AND SUBACROMIAL DECOMPRESSION Left 09/23/2019   Procedure: SHOULDER ARTHROSCOPY WITH MINI ROTATOR CUFF REPAIR AND SUBACROMIAL DECOMPRESSION, BICEPS TENODESIS;  Surgeon: Signa Kell, MD;  Location: [facility];  Service: Orthopedics;  Laterality: Left;   TONSILLECTOMY     TUBAL LIGATION     Patient Active Problem List   Diagnosis Date Noted   Oral thrush 05/15/2023   Acute maxillary sinusitis 05/15/2023   Chronic radicular lumbar pain 03/20/2023   Exposure to  sexually transmitted disease (STD) 01/25/2023   Acute vaginitis 01/25/2023   Abnormal cervical Papanicolaou smear 01/25/2023   Abnormal MRI, cervical spine (09/04/2017) 07/22/2019   Neurogenic pain 07/22/2019   DDD (degenerative disc disease), cervical 06/09/2019   Chronic musculoskeletal pain 05/14/2019   Trypanophobia (Needle phobia) 09/26/2018   Spondylosis without myelopathy or radiculopathy, lumbar region 08/29/2018   Lumbar facet hypertrophy 08/29/2018   Lumbar facet syndrome (Right) 08/29/2018   DDD (degenerative disc disease), thoracic 08/29/2018   Thoracic spondylosis with radiculopathy (Right) 08/29/2018   Spinal enthesopathy of lumbar region (HCC) 08/29/2018   Chronic midline low back pain without sciatica 08/29/2018   Generalized anxiety disorder 07/23/2018   Cervical facet hypertrophy (Bilateral) 07/18/2018   Cervical central spinal stenosis 07/18/2018   Chronic shoulder pain (Left) 07/09/2018   Cervicalgia (Bilateral) (L>R) 03/28/2018   Spondylosis without myelopathy or radiculopathy, cervical region 02/11/2018   Chronic pelvic pain in female 08/24/2017   Fatty liver 08/24/2017   Chronic low back pain (Bilateral) (L>R) 08/13/2017   Cervical Foraminal Stenosis (Severe) (Bilateral: C5-6) 08/13/2017   Cervical spondylosis with radiculopathy (Bilateral) (L>R) 08/13/2017   Cervical sensory radiculopathy at C5 (Bilateral) 08/13/2017   Cervical radicular pain 08/13/2017   Constipation 07/16/2017   Myofascial pain syndrome, cervical (trapezius) (Left) 05/28/2017   Cervical facet syndrome (Bilateral) (L>R) 05/28/2017  Muscle spasm 05/15/2017   Chronic hip pain (Right) 03/19/2017   Chronic knee arthropathy (Right) 03/14/2017   Osteoarthritis of knee (Right) 02/21/2017   Osteoarthritis of shoulder (Right) 01/31/2017   Shortness of breath 01/15/2017   Thoracic spondylosis 01/01/2017   Radicular pain of thoracic region 12/21/2016   Osteoarthritis of hip (Bilateral) 11/20/2016    Greater trochanteric bursitis (Right) 09/18/2016   Subacromial bursitis of shoulder joint (Right) 09/18/2016   Overweight (BMI 25.0-29.9) 09/14/2016   Elevated sedimentation rate 08/31/2016   Elevated C-reactive protein (CRP) 08/31/2016   Long term current use of opiate analgesic 07/20/2016   Long term prescription opiate use 07/20/2016   Opiate use 07/20/2016   Chronic upper back pain (Secondary Area of Pain) (Bilateral) (L>R) 07/20/2016   Chronic shoulder pain (Tertiary Area of Pain) (Bilateral) (L>R) 07/20/2016   Chronic knee pain (Right) 07/20/2016   Chronic upper extremity pain (Left) 07/20/2016   Chronic lower extremity pain (Bilateral) (L>R) 07/20/2016   Chronic abdominal pain 07/20/2016   Chronic hip pain (Bilateral) (R>L) 05/31/2016   Chronic Greater trochanteric bursitis (Bilateral) (L>R) 05/31/2016   Bursitis of shoulder (Right) 05/17/2016   Skin lesions 04/27/2016   Hyperlipidemia 02/18/2016   Cervical spondylosis (Bilateral) 02/08/2016   Fibromyalgia 02/08/2016   Osteopenia 02/08/2016   Chronic neck pain (Primary Area of Pain) (Bilateral) (L>R) 01/28/2016   Concentration deficit 01/28/2016   Special screening for malignant neoplasms, vagina 01/14/2016   Lumbar spondylosis 01/06/2016   Anxiety and depression 07/11/2015   Chronic pain syndrome 07/11/2015   Combined fat and carbohydrate induced hyperlipemia 07/11/2015    PCP: Ellsworth Lennox   REFERRING PROVIDER:    [name] ( urge incontinence)   [name] ( cervical , thoracic, LBP, chronic pain)   REFERRING DIAG: Urge incontinence  Rationale for Evaluation and Treatment Rehabilitation  THERAPY DIAG:  Sacrococcygeal disorders, not elsewhere classified  Other low back pain  Other abnormalities of gait and mobility  Leg length difference, acquired  Numbness in both hands  ONSET DATE:   SUBJECTIVE:            SUBJECTIVE STATEMENT :  Pt noticed she was constipated and has difficulty with turning her body  to wipe.   R LBP , neck, and midback are better by 80%. N/T in BUE has resolved.  SUBJECTIVE STATEMENT ON EVAL 04/03/23  : 1) Urge incontinence and SUI  : 3 out of 10 times, pt has had leakage before making it to the bathroom . Changes 2 pads per day.    2) neck pain, midback, R LBP:  From sitting position, pt feels there is locking from her R hip and low back. Pt had been provided a shoe lift 25 years ago and she used it for 5 years and it helped with pressure. Pt has not worn one since and was not given information about where to get more shoe lifts. Pt has no radiating pain currently. Pt feels she is leaning to the R. It takes her a few minute to stretch her back after sitting or driving   Pt has tingling and numbness in both her arms and to index and middle fingers on R hand, and pinky on the L hand in the morning. N/T does not occur with other activities.   PERTINENT HISTORY:  Tubal ligation,  L shoulder surgery, 2 surgeries on L ankle, 2 vaginal deliveries with vacuum and forceps   PAIN:  Are you having pain? Yes: see above   PRECAUTIONS: None  WEIGHT BEARING RESTRICTIONS:  No  FALLS:  Has patient fallen in last 6 months? No  LIVING ENVIRONMENT: Lives with: lives alone Lives in: House/apartment Stairs: No  OCCUPATION: caregiving and cleaning   PLOF: Independent  PATIENT GOALS:   Would like to stop hurting and return to physical activities     OBJECTIVE:    [facility] PT Assessment - 05/21/23 1346       Palpation   SI assessment  levelled pelvic girdle, shoulders B    Palpation comment flank facial tightness, limited lateral rib expansion      Ambulation/Gait   Gait Comments 1.36 m/s with reciporcal gait ,             [facility] Adult PT Treatment/Exercise - 05/21/23 1346       Therapeutic Activites    Other Therapeutic Activities reassessed FOTO lumbar      Neuro Re-ed    Neuro Re-ed Details  cued for higher knees, longer stride, and arm swings in gait, cued  for more diaphragmatic excursion      Moist Heat Therapy   Number Minutes Moist Heat 3 Minutes    Moist Heat Location --   abdomen ( unbilled)     Manual Therapy   Other Manual Therapy STM over flank Bfascial mobilization over upper and lower ab to promote motility. lateral excursion of ribs for deep core  system                 HOME EXERCISE PROGRAM: See pt instruction section    ASSESSMENT:  CLINICAL IMPRESSION:  R LBP , neck, and midback are better by 80%. N/T in BUE has resolved. Pt showed milestone improvement with levelled pelvis and shoulder alignment.   Focused on manual Tx to promote more fascial mobility over flank and abdomen. Educated pt on colon massage to promote bowel movements. Plan to continue with manual Tx to promote more lateral excursion of ribs for optimal deep core HEP.  Anticipate today's session will help with constipation and incontinence.   Regional interdependent approaches will yield greater benefits.   Pt benefits from skilled PT.    OBJECTIVE IMPAIRMENTS decreased activity tolerance, decreased coordination, decreased endurance, decreased mobility, difficulty walking, decreased ROM, decreased strength, decreased safety awareness, hypomobility, increased muscle spasms, impaired flexibility, improper body mechanics, postural dysfunction, and pain. scar restrictions   ACTIVITY LIMITATIONS  self-care,  sleep, home chores, work tasks    PARTICIPATION LIMITATIONS:  community, gym activities    PERSONAL FACTORS       are also affecting patient's functional outcome.    REHAB POTENTIAL: Good   CLINICAL DECISION MAKING: Evolving/moderate complexity   EVALUATION COMPLEXITY: Moderate    PATIENT EDUCATION:    Education details: Showed pt anatomy images. Explained muscles attachments/ connection, physiology of deep core system/ spinal- thoracic-pelvis-lower kinetic chain as they relate to pt's presentation, Sx, and past Hx. Explained what and how  these areas of deficits need to be restored to balance and function    See Therapeutic activity / neuromuscular re-education section  Answered pt's questions.   Person educated: Patient Education method: Explanation, Demonstration, Tactile cues, Verbal cues, and Handouts Education comprehension: verbalized understanding, returned demonstration, verbal cues required, tactile cues required, and needs further education     PLAN: PT FREQUENCY: 1x/week   PT DURATION: 10 weeks   PLANNED INTERVENTIONS: Therapeutic exercises, Therapeutic activity, Neuromuscular re-education, Balance training, Gait training, Patient/Family education, Self Care, Joint mobilization, Spinal mobilization, Moist heat, Taping, and Manual therapy, dry needling.   PLAN FOR  NEXT SESSION: See clinical impression for plan     GOALS: Goals reviewed with patient? Yes  SHORT TERM GOALS: Target date: 05/01/2023    Pt will demo IND with HEP                    Baseline: Not IND            Goal status: MET   LONG TERM GOALS: Target date: 06/12/2023    1.Pt will demo proper deep core coordination without chest breathing and optimal excursion of diaphragm/pelvic floor in order to promote spinal stability and pelvic floor function  Baseline: dyscoordination Goal status: ON going  2.  Pt will demo > 5 pt change on FOTO  to improve QOL and function    Urinary Problem baseline-   59  Higher score = better function    Lumber baseline  - 55  -> 05/21/23: 50 pts  Higher score = better function   Goal status: Partially Met   3.  Pt will demo proper body mechanics in against gravity tasks and ADLs  work tasks, fitness  to minimize straining pelvic floor / back                  Baseline: not IND, improper form that places strain on pelvic floor                Goal status: ongoing     4. Pt will demo levelled pelvic girdle and shoulder height in order to progress to deep core strengthening HEP and restore mobility  at spine, pelvis, gait, posture   Baseline: L shoulder and iliac crest higher  Goal status:: MET  05/21/23 at 5th visit    5. Pt will report no more N/T upon waking in there B hands in order to improve ADL and QOL  Baseline: Pt has tingling and numbness in both her arms and to index and middle fingers on R hand, and pinky on the L hand in the morning.  Goal status: MET  05/21/23 at 5th visit   6. Pt will demo increased gait speed > 1.3 m/s with reciprocal gait  in order to ambulate safely in community and return to fitness routine  Baseline: short strides minimal hip flexion,push off, decreased stance on R, 1.10 m/s  Goal status:  MET at 5th visits 1.36 m/s ( reciporcal gait , NO NEED FOR SHOE LIFT)        Mariane Masters, PT 05/21/2023, 1:38 PM

## 2023-05-25 ENCOUNTER — Other Ambulatory Visit: Payer: Self-pay | Admitting: Internal Medicine

## 2023-05-25 ENCOUNTER — Telehealth: Payer: Self-pay | Admitting: Internal Medicine

## 2023-05-25 DIAGNOSIS — K029 Dental caries, unspecified: Secondary | ICD-10-CM

## 2023-05-25 MED ORDER — IBUPROFEN 800 MG PO TABS
800.0000 mg | ORAL_TABLET | Freq: Three times a day (TID) | ORAL | 0 refills | Status: AC | PRN
Start: 2023-05-25 — End: 2023-06-24

## 2023-05-25 NOTE — Telephone Encounter (Signed)
Patient called in stating she had a tooth pulled and has been taking 800 mg ibuprofen. She needs a refill on this, please send.  [facility] [address]

## 2023-05-28 ENCOUNTER — Ambulatory Visit: Payer: Medicaid Other | Admitting: Physical Therapy

## 2023-06-05 ENCOUNTER — Ambulatory Visit: Payer: Medicaid Other | Admitting: Physical Therapy

## 2023-06-05 DIAGNOSIS — R2689 Other abnormalities of gait and mobility: Secondary | ICD-10-CM

## 2023-06-05 DIAGNOSIS — R200 Anesthesia of skin: Secondary | ICD-10-CM

## 2023-06-05 DIAGNOSIS — M533 Sacrococcygeal disorders, not elsewhere classified: Secondary | ICD-10-CM | POA: Diagnosis not present

## 2023-06-05 DIAGNOSIS — M2170 Unequal limb length (acquired), unspecified site: Secondary | ICD-10-CM

## 2023-06-05 DIAGNOSIS — M5459 Other low back pain: Secondary | ICD-10-CM

## 2023-06-05 NOTE — Patient Instructions (Signed)
Stretch for pelvic floor   Strap on ballmounds ( toes spread)  Hamstring _knee bends    10 reps with knee pointing out towards armpit ( notice the stretch in the medial hamstring muscle)   Scoot hips to the R  _scoot hips to R, cross R leg over L and drop knee to L, grab R thigh with L hand  ,   On belly: Riding horse edge of mattress  knee bent like riding a horse, move knee towards armpit and out  10 reps    Childs pose rocking   Toes tucked, shoulders down and back, on forearms , hands shoulder width apart, fingers straight, elbow back , squeeze imaginary pencils in armpit, shoulder down and away from ears  10 reps

## 2023-06-05 NOTE — Therapy (Signed)
OUTPATIENT PHYSICAL THERAPY Treatment    Patient Name: Nicole Walter MRN: [id number] DOB:[birth year], 62 y.o., female Today's Date:05/01/23   PT End of Session - 06/05/23 1336     Visit Number 6    Number of Visits 10    Date for PT Re-Evaluation 06/12/23    Authorization Type Medicaid    PT Start Time 1334    PT Stop Time 1416    PT Time Calculation (min) 42 min    Activity Tolerance Patient tolerated treatment well;No increased pain    Behavior During Therapy [facility] for tasks assessed/performed              Past Medical History:  Diagnosis Date   Abnormal liver enzymes 02/08/2016   Allergy    Anxiety    Chest pain 07/05/2016   Chronic pain    Depression    DJD (degenerative joint disease)    lumbar   Fibromyalgia    High cholesterol    Rash of back 01/28/2016   Weight loss due to medication    Past Surgical History:  Procedure Laterality Date   ANKLE SURGERY     x 2   BREAST BIOPSY Left 1997   CHOLECYSTECTOMY     COLONOSCOPY     COLONOSCOPY WITH PROPOFOL N/A 02/22/2016   Procedure: COLONOSCOPY WITH PROPOFOL;  Surgeon: Christena Deem, MD;  Location: [facility] ENDOSCOPY;  Service: Endoscopy;  Laterality: N/A;   ESOPHAGOGASTRODUODENOSCOPY (EGD) WITH PROPOFOL N/A 02/22/2016   Procedure: ESOPHAGOGASTRODUODENOSCOPY (EGD) WITH PROPOFOL;  Surgeon: Christena Deem, MD;  Location: [facility] ENDOSCOPY;  Service: Endoscopy;  Laterality: N/A;   NASAL SINUS SURGERY     SHOULDER ARTHROSCOPY WITH ROTATOR CUFF REPAIR AND SUBACROMIAL DECOMPRESSION Left 09/23/2019   Procedure: SHOULDER ARTHROSCOPY WITH MINI ROTATOR CUFF REPAIR AND SUBACROMIAL DECOMPRESSION, BICEPS TENODESIS;  Surgeon: Signa Kell, MD;  Location: [facility];  Service: Orthopedics;  Laterality: Left;   TONSILLECTOMY     TUBAL LIGATION     Patient Active Problem List   Diagnosis Date Noted   Oral thrush 05/15/2023   Acute maxillary sinusitis 05/15/2023   Chronic radicular lumbar pain 03/20/2023   Exposure to  sexually transmitted disease (STD) 01/25/2023   Acute vaginitis 01/25/2023   Abnormal cervical Papanicolaou smear 01/25/2023   Abnormal MRI, cervical spine (09/04/2017) 07/22/2019   Neurogenic pain 07/22/2019   DDD (degenerative disc disease), cervical 06/09/2019   Chronic musculoskeletal pain 05/14/2019   Trypanophobia (Needle phobia) 09/26/2018   Spondylosis without myelopathy or radiculopathy, lumbar region 08/29/2018   Lumbar facet hypertrophy 08/29/2018   Lumbar facet syndrome (Right) 08/29/2018   DDD (degenerative disc disease), thoracic 08/29/2018   Thoracic spondylosis with radiculopathy (Right) 08/29/2018   Spinal enthesopathy of lumbar region (HCC) 08/29/2018   Chronic midline low back pain without sciatica 08/29/2018   Generalized anxiety disorder 07/23/2018   Cervical facet hypertrophy (Bilateral) 07/18/2018   Cervical central spinal stenosis 07/18/2018   Chronic shoulder pain (Left) 07/09/2018   Cervicalgia (Bilateral) (L>R) 03/28/2018   Spondylosis without myelopathy or radiculopathy, cervical region 02/11/2018   Chronic pelvic pain in female 08/24/2017   Fatty liver 08/24/2017   Chronic low back pain (Bilateral) (L>R) 08/13/2017   Cervical Foraminal Stenosis (Severe) (Bilateral: C5-6) 08/13/2017   Cervical spondylosis with radiculopathy (Bilateral) (L>R) 08/13/2017   Cervical sensory radiculopathy at C5 (Bilateral) 08/13/2017   Cervical radicular pain 08/13/2017   Constipation 07/16/2017   Myofascial pain syndrome, cervical (trapezius) (Left) 05/28/2017   Cervical facet syndrome (Bilateral) (L>R) 05/28/2017  Muscle spasm 05/15/2017   Chronic hip pain (Right) 03/19/2017   Chronic knee arthropathy (Right) 03/14/2017   Osteoarthritis of knee (Right) 02/21/2017   Osteoarthritis of shoulder (Right) 01/31/2017   Shortness of breath 01/15/2017   Thoracic spondylosis 01/01/2017   Radicular pain of thoracic region 12/21/2016   Osteoarthritis of hip (Bilateral) 11/20/2016    Greater trochanteric bursitis (Right) 09/18/2016   Subacromial bursitis of shoulder joint (Right) 09/18/2016   Overweight (BMI 25.0-29.9) 09/14/2016   Elevated sedimentation rate 08/31/2016   Elevated C-reactive protein (CRP) 08/31/2016   Long term current use of opiate analgesic 07/20/2016   Long term prescription opiate use 07/20/2016   Opiate use 07/20/2016   Chronic upper back pain (Secondary Area of Pain) (Bilateral) (L>R) 07/20/2016   Chronic shoulder pain (Tertiary Area of Pain) (Bilateral) (L>R) 07/20/2016   Chronic knee pain (Right) 07/20/2016   Chronic upper extremity pain (Left) 07/20/2016   Chronic lower extremity pain (Bilateral) (L>R) 07/20/2016   Chronic abdominal pain 07/20/2016   Chronic hip pain (Bilateral) (R>L) 05/31/2016   Chronic Greater trochanteric bursitis (Bilateral) (L>R) 05/31/2016   Bursitis of shoulder (Right) 05/17/2016   Skin lesions 04/27/2016   Hyperlipidemia 02/18/2016   Cervical spondylosis (Bilateral) 02/08/2016   Fibromyalgia 02/08/2016   Osteopenia 02/08/2016   Chronic neck pain (Primary Area of Pain) (Bilateral) (L>R) 01/28/2016   Concentration deficit 01/28/2016   Special screening for malignant neoplasms, vagina 01/14/2016   Lumbar spondylosis 01/06/2016   Anxiety and depression 07/11/2015   Chronic pain syndrome 07/11/2015   Combined fat and carbohydrate induced hyperlipemia 07/11/2015    PCP: Ellsworth Lennox   REFERRING PROVIDER:    [name] ( urge incontinence)   [name] ( cervical , thoracic, LBP, chronic pain)   REFERRING DIAG: Urge incontinence  Rationale for Evaluation and Treatment Rehabilitation  THERAPY DIAG:  Sacrococcygeal disorders, not elsewhere classified  Other abnormalities of gait and mobility  Other low back pain  Leg length difference, acquired  Numbness in both hands  ONSET DATE:   SUBJECTIVE:            SUBJECTIVE STATEMENT :  Pt had no radiating LBP for the past 2 weeks.   SUBJECTIVE STATEMENT ON  EVAL 04/03/23  : 1) Urge incontinence and SUI  : 3 out of 10 times, pt has had leakage before making it to the bathroom . Changes 2 pads per day.    2) neck pain, midback, R LBP:  From sitting position, pt feels there is locking from her R hip and low back. Pt had been provided a shoe lift 25 years ago and she used it for 5 years and it helped with pressure. Pt has not worn one since and was not given information about where to get more shoe lifts. Pt has no radiating pain currently. Pt feels she is leaning to the R. It takes her a few minute to stretch her back after sitting or driving   Pt has tingling and numbness in both her arms and to index and middle fingers on R hand, and pinky on the L hand in the morning. N/T does not occur with other activities.   PERTINENT HISTORY:  Tubal ligation,  L shoulder surgery, 2 surgeries on L ankle, 2 vaginal deliveries with vacuum and forceps   PAIN:  Are you having pain? Yes: see above   PRECAUTIONS: None  WEIGHT BEARING RESTRICTIONS: No  FALLS:  Has patient fallen in last 6 months? No  LIVING ENVIRONMENT: Lives with: lives alone Lives  in: House/apartment Stairs: No  OCCUPATION: caregiving and cleaning   PLOF: Independent  PATIENT GOALS:   Would like to stop hurting and return to physical activities     OBJECTIVE:     Pelvic Floor Special Questions - 06/05/23 1405     External Perineal Exam R posterior mm tightness, hamstring/ adductor, glut med R               [facility] Adult PT Treatment/Exercise - 06/05/23 1406       Neuro Re-ed    Neuro Re-ed Details  cued for stretches with strap      Exercises   Exercises --   see pt instructions     Manual Therapy   Other Manual Therapy STM at problem areas noted ina ssessment                  HOME EXERCISE PROGRAM: See pt instruction section    ASSESSMENT:  CLINICAL IMPRESSION:  Radiating LBP has improved  by 100%.  Pt showed decreased posterior R pelvic  floor hypomobility post Tx. Tightness is related to hamstring./ adductor. G;ut med tightness on R as well. Stretches were cued for technique and alignment to address these areas.   Anticipate today's session will help with constipation and incontinence.  Plan on continuing with manual Tx to promote more fascial mobility over flank and abdomen next session Regional interdependent approaches will yield greater benefits.   Pt benefits from skilled PT.    OBJECTIVE IMPAIRMENTS decreased activity tolerance, decreased coordination, decreased endurance, decreased mobility, difficulty walking, decreased ROM, decreased strength, decreased safety awareness, hypomobility, increased muscle spasms, impaired flexibility, improper body mechanics, postural dysfunction, and pain. scar restrictions   ACTIVITY LIMITATIONS  self-care,  sleep, home chores, work tasks    PARTICIPATION LIMITATIONS:  community, gym activities    PERSONAL FACTORS       are also affecting patient's functional outcome.    REHAB POTENTIAL: Good   CLINICAL DECISION MAKING: Evolving/moderate complexity   EVALUATION COMPLEXITY: Moderate    PATIENT EDUCATION:    Education details: Showed pt anatomy images. Explained muscles attachments/ connection, physiology of deep core system/ spinal- thoracic-pelvis-lower kinetic chain as they relate to pt's presentation, Sx, and past Hx. Explained what and how these areas of deficits need to be restored to balance and function    See Therapeutic activity / neuromuscular re-education section  Answered pt's questions.   Person educated: Patient Education method: Explanation, Demonstration, Tactile cues, Verbal cues, and Handouts Education comprehension: verbalized understanding, returned demonstration, verbal cues required, tactile cues required, and needs further education     PLAN: PT FREQUENCY: 1x/week   PT DURATION: 10 weeks   PLANNED INTERVENTIONS: Therapeutic exercises, Therapeutic  activity, Neuromuscular re-education, Balance training, Gait training, Patient/Family education, Self Care, Joint mobilization, Spinal mobilization, Moist heat, Taping, and Manual therapy, dry needling.   PLAN FOR NEXT SESSION: See clinical impression for plan     GOALS: Goals reviewed with patient? Yes  SHORT TERM GOALS: Target date: 05/01/2023    Pt will demo IND with HEP                    Baseline: Not IND            Goal status: MET   LONG TERM GOALS: Target date: 06/12/2023    1.Pt will demo proper deep core coordination without chest breathing and optimal excursion of diaphragm/pelvic floor in order to promote spinal stability and pelvic floor function  Baseline: dyscoordination Goal status: ON going  2.  Pt will demo > 5 pt change on FOTO  to improve QOL and function    Urinary Problem baseline-   59  Higher score = better function    Lumber baseline  - 55  -> 05/21/23: 50 pts  Higher score = better function   Goal status: Partially Met   3.  Pt will demo proper body mechanics in against gravity tasks and ADLs  work tasks, fitness  to minimize straining pelvic floor / back                  Baseline: not IND, improper form that places strain on pelvic floor                Goal status: ongoing     4. Pt will demo levelled pelvic girdle and shoulder height in order to progress to deep core strengthening HEP and restore mobility at spine, pelvis, gait, posture   Baseline: L shoulder and iliac crest higher  Goal status:: MET  05/21/23 at 5th visit    5. Pt will report no more N/T upon waking in there B hands in order to improve ADL and QOL  Baseline: Pt has tingling and numbness in both her arms and to index and middle fingers on R hand, and pinky on the L hand in the morning.  Goal status: MET  05/21/23 at 5th visit   6. Pt will demo increased gait speed > 1.3 m/s with reciprocal gait  in order to ambulate safely in community and return to fitness routine   Baseline: short strides minimal hip flexion,push off, decreased stance on R, 1.10 m/s  Goal status:  MET at 5th visits 1.36 m/s ( reciporcal gait , NO NEED FOR SHOE LIFT)        Mariane Masters, PT 06/05/2023, 2:14 PM

## 2023-06-12 ENCOUNTER — Ambulatory Visit: Payer: Medicaid Other | Attending: Internal Medicine | Admitting: Physical Therapy

## 2023-06-12 DIAGNOSIS — M2170 Unequal limb length (acquired), unspecified site: Secondary | ICD-10-CM | POA: Diagnosis present

## 2023-06-12 DIAGNOSIS — R2689 Other abnormalities of gait and mobility: Secondary | ICD-10-CM | POA: Insufficient documentation

## 2023-06-12 DIAGNOSIS — M533 Sacrococcygeal disorders, not elsewhere classified: Secondary | ICD-10-CM | POA: Insufficient documentation

## 2023-06-12 DIAGNOSIS — R200 Anesthesia of skin: Secondary | ICD-10-CM | POA: Diagnosis present

## 2023-06-12 DIAGNOSIS — M5459 Other low back pain: Secondary | ICD-10-CM | POA: Insufficient documentation

## 2023-06-12 NOTE — Therapy (Signed)
OUTPATIENT PHYSICAL THERAPY Treatment  / Recert    Patient Name: Nicole Walter MRN: [id number] DOB:[birth year], 62 y.o., female Today's Date:05/01/23   PT End of Session - 06/12/23 1356     Visit Number 7    Number of Visits 17    Date for PT Re-Evaluation 08/21/23    Authorization Type Medicaid    PT Start Time 1332    PT Stop Time 1415    PT Time Calculation (min) 43 min    Activity Tolerance Patient tolerated treatment well;No increased pain    Behavior During Therapy [facility] for tasks assessed/performed              Past Medical History:  Diagnosis Date   Abnormal liver enzymes 02/08/2016   Allergy    Anxiety    Chest pain 07/05/2016   Chronic pain    Depression    DJD (degenerative joint disease)    lumbar   Fibromyalgia    High cholesterol    Rash of back 01/28/2016   Weight loss due to medication    Past Surgical History:  Procedure Laterality Date   ANKLE SURGERY     x 2   BREAST BIOPSY Left 1997   CHOLECYSTECTOMY     COLONOSCOPY     COLONOSCOPY WITH PROPOFOL N/A 02/22/2016   Procedure: COLONOSCOPY WITH PROPOFOL;  Surgeon: Christena Deem, MD;  Location: [facility] ENDOSCOPY;  Service: Endoscopy;  Laterality: N/A;   ESOPHAGOGASTRODUODENOSCOPY (EGD) WITH PROPOFOL N/A 02/22/2016   Procedure: ESOPHAGOGASTRODUODENOSCOPY (EGD) WITH PROPOFOL;  Surgeon: Christena Deem, MD;  Location: [facility] ENDOSCOPY;  Service: Endoscopy;  Laterality: N/A;   NASAL SINUS SURGERY     SHOULDER ARTHROSCOPY WITH ROTATOR CUFF REPAIR AND SUBACROMIAL DECOMPRESSION Left 09/23/2019   Procedure: SHOULDER ARTHROSCOPY WITH MINI ROTATOR CUFF REPAIR AND SUBACROMIAL DECOMPRESSION, BICEPS TENODESIS;  Surgeon: Signa Kell, MD;  Location: [facility];  Service: Orthopedics;  Laterality: Left;   TONSILLECTOMY     TUBAL LIGATION     Patient Active Problem List   Diagnosis Date Noted   Oral thrush 05/15/2023   Acute maxillary sinusitis 05/15/2023   Chronic radicular lumbar pain 03/20/2023    Exposure to sexually transmitted disease (STD) 01/25/2023   Acute vaginitis 01/25/2023   Abnormal cervical Papanicolaou smear 01/25/2023   Abnormal MRI, cervical spine (09/04/2017) 07/22/2019   Neurogenic pain 07/22/2019   DDD (degenerative disc disease), cervical 06/09/2019   Chronic musculoskeletal pain 05/14/2019   Trypanophobia (Needle phobia) 09/26/2018   Spondylosis without myelopathy or radiculopathy, lumbar region 08/29/2018   Lumbar facet hypertrophy 08/29/2018   Lumbar facet syndrome (Right) 08/29/2018   DDD (degenerative disc disease), thoracic 08/29/2018   Thoracic spondylosis with radiculopathy (Right) 08/29/2018   Spinal enthesopathy of lumbar region (HCC) 08/29/2018   Chronic midline low back pain without sciatica 08/29/2018   Generalized anxiety disorder 07/23/2018   Cervical facet hypertrophy (Bilateral) 07/18/2018   Cervical central spinal stenosis 07/18/2018   Chronic shoulder pain (Left) 07/09/2018   Cervicalgia (Bilateral) (L>R) 03/28/2018   Spondylosis without myelopathy or radiculopathy, cervical region 02/11/2018   Chronic pelvic pain in female 08/24/2017   Fatty liver 08/24/2017   Chronic low back pain (Bilateral) (L>R) 08/13/2017   Cervical Foraminal Stenosis (Severe) (Bilateral: C5-6) 08/13/2017   Cervical spondylosis with radiculopathy (Bilateral) (L>R) 08/13/2017   Cervical sensory radiculopathy at C5 (Bilateral) 08/13/2017   Cervical radicular pain 08/13/2017   Constipation 07/16/2017   Myofascial pain syndrome, cervical (trapezius) (Left) 05/28/2017   Cervical facet syndrome (Bilateral) (L>R)  05/28/2017   Muscle spasm 05/15/2017   Chronic hip pain (Right) 03/19/2017   Chronic knee arthropathy (Right) 03/14/2017   Osteoarthritis of knee (Right) 02/21/2017   Osteoarthritis of shoulder (Right) 01/31/2017   Shortness of breath 01/15/2017   Thoracic spondylosis 01/01/2017   Radicular pain of thoracic region 12/21/2016   Osteoarthritis of hip  (Bilateral) 11/20/2016   Greater trochanteric bursitis (Right) 09/18/2016   Subacromial bursitis of shoulder joint (Right) 09/18/2016   Overweight (BMI 25.0-29.9) 09/14/2016   Elevated sedimentation rate 08/31/2016   Elevated C-reactive protein (CRP) 08/31/2016   Long term current use of opiate analgesic 07/20/2016   Long term prescription opiate use 07/20/2016   Opiate use 07/20/2016   Chronic upper back pain (Secondary Area of Pain) (Bilateral) (L>R) 07/20/2016   Chronic shoulder pain (Tertiary Area of Pain) (Bilateral) (L>R) 07/20/2016   Chronic knee pain (Right) 07/20/2016   Chronic upper extremity pain (Left) 07/20/2016   Chronic lower extremity pain (Bilateral) (L>R) 07/20/2016   Chronic abdominal pain 07/20/2016   Chronic hip pain (Bilateral) (R>L) 05/31/2016   Chronic Greater trochanteric bursitis (Bilateral) (L>R) 05/31/2016   Bursitis of shoulder (Right) 05/17/2016   Skin lesions 04/27/2016   Hyperlipidemia 02/18/2016   Cervical spondylosis (Bilateral) 02/08/2016   Fibromyalgia 02/08/2016   Osteopenia 02/08/2016   Chronic neck pain (Primary Area of Pain) (Bilateral) (L>R) 01/28/2016   Concentration deficit 01/28/2016   Special screening for malignant neoplasms, vagina 01/14/2016   Lumbar spondylosis 01/06/2016   Anxiety and depression 07/11/2015   Chronic pain syndrome 07/11/2015   Combined fat and carbohydrate induced hyperlipemia 07/11/2015    PCP: [name]   REFERRING PROVIDER:    [name] ( urge incontinence)   [name] ( cervical , thoracic, LBP, chronic pain)   REFERRING DIAG: Urge incontinence  Rationale for Evaluation and Treatment Rehabilitation  THERAPY DIAG:  Sacrococcygeal disorders, not elsewhere classified  Other low back pain  Leg length difference, acquired  Numbness in both hands  Other abnormalities of gait and mobility  ONSET DATE:   SUBJECTIVE:            SUBJECTIVE STATEMENT :  Pt used the weed wacker yesterday.  Pt also found  some pulling pain at R underarm after using the strap for stretches at last session  SUBJECTIVE STATEMENT ON EVAL 04/03/23  : 1) Urge incontinence and SUI  : 3 out of 10 times, pt has had leakage before making it to the bathroom . Changes 2 pads per day.    2) neck pain, midback, R LBP:  From sitting position, pt feels there is locking from her R hip and low back. Pt had been provided a shoe lift 25 years ago and she used it for 5 years and it helped with pressure. Pt has not worn one since and was not given information about where to get more shoe lifts. Pt has no radiating pain currently. Pt feels she is leaning to the R. It takes her a few minute to stretch her back after sitting or driving   Pt has tingling and numbness in both her arms and to index and middle fingers on R hand, and pinky on the L hand in the morning. N/T does not occur with other activities.   PERTINENT HISTORY:  Tubal ligation,  L shoulder surgery, 2 surgeries on L ankle, 2 vaginal deliveries with vacuum and forceps   PAIN:  Are you having pain? Yes: see above   PRECAUTIONS: None  WEIGHT BEARING RESTRICTIONS: No  FALLS:  Has patient fallen in last 6 months? No  LIVING ENVIRONMENT: Lives with: lives alone Lives in: House/apartment Stairs: No  OCCUPATION: caregiving and cleaning   PLOF: Independent  PATIENT GOALS:   Would like to stop hurting and return to physical activities     OBJECTIVE:                            [facility] PT Assessment - 06/12/23 1357       Observation/Other Assessments   Observations simulated weekwacker with excessive twisting of back, bilateral stance      Ambulation/Gait   Gait Comments minimal hip /knee flexion, short stride, guarded trunk , minimal arm swings  ( improved pattern with cues )              [facility] Adult PT Treatment/Exercise - 06/12/23 1357       Therapeutic Activites    Other Therapeutic Activities provided biopsychosocial approaches with graded  movement, stretches for self-management of pain      Neuro Re-ed    Neuro Re-ed Details  excessive cues for gait , longer strides, and modified figure 4 stretch that did not cause pain, cued for lunge stance and using LKC stability with use of weedwacker , practiced simulated tehcniqued              HOME EXERCISE PROGRAM: See pt instruction section    ASSESSMENT:  CLINICAL IMPRESSION:  Pt has met 4/6 goals. Progressing well towards remaining goals.  Radiating LBP has improved  by 100%. Spine and pelvic girdle alignment is remaining levelled without shoe lift. Pt is compliant with HEP.   Today, provided excessive cues for gait , longer strides, and modified figure 4 stretch that did not cause pain, cued for lunge stance and using LKC stability with use of weedwacker , practiced simulated technique with weed wacker.     Modified figure-4 stretch in a non-painful position. Pt demo'd IND. Instructed pt to discontinue the strap stretches due to pt's pain response with UE.    Provided  biopsychosocial approaches with graded movement, stretches for self-management of pain.    Plan on continuing with manual Tx to promote more fascial mobility over flank and abdomen next session Regional interdependent approaches will yield greater benefits.   Pt benefits from skilled PT.    OBJECTIVE IMPAIRMENTS decreased activity tolerance, decreased coordination, decreased endurance, decreased mobility, difficulty walking, decreased ROM, decreased strength, decreased safety awareness, hypomobility, increased muscle spasms, impaired flexibility, improper body mechanics, postural dysfunction, and pain. scar restrictions   ACTIVITY LIMITATIONS  self-care,  sleep, home chores, work tasks    PARTICIPATION LIMITATIONS:  community, gym activities    PERSONAL FACTORS       are also affecting patient's functional outcome.    REHAB POTENTIAL: Good   CLINICAL DECISION MAKING: Evolving/moderate  complexity   EVALUATION COMPLEXITY: Moderate    PATIENT EDUCATION:    Education details: Showed pt anatomy images. Explained muscles attachments/ connection, physiology of deep core system/ spinal- thoracic-pelvis-lower kinetic chain as they relate to pt's presentation, Sx, and past Hx. Explained what and how these areas of deficits need to be restored to balance and function    See Therapeutic activity / neuromuscular re-education section  Answered pt's questions.   Person educated: Patient Education method: Explanation, Demonstration, Tactile cues, Verbal cues, and Handouts Education comprehension: verbalized understanding, returned demonstration, verbal cues required, tactile cues required, and needs further education  PLAN: PT FREQUENCY: 1x/week   PT DURATION: 10 weeks   PLANNED INTERVENTIONS: Therapeutic exercises, Therapeutic activity, Neuromuscular re-education, Balance training, Gait training, Patient/Family education, Self Care, Joint mobilization, Spinal mobilization, Moist heat, Taping, and Manual therapy, dry needling.   PLAN FOR NEXT SESSION: See clinical impression for plan     GOALS: Goals reviewed with patient? Yes  SHORT TERM GOALS: Target date: 05/01/2023    Pt will demo IND with HEP                    Baseline: Not IND            Goal status: MET   LONG TERM GOALS: Target date: 08/21/2023    1.Pt will demo proper deep core coordination without chest breathing and optimal excursion of diaphragm/pelvic floor in order to promote spinal stability and pelvic floor function  Baseline: dyscoordination Goal status:  MET   2.  Pt will demo > 5 pt change on FOTO  to improve QOL and function    Urinary Problem baseline-   59  Higher score = better function    Lumber baseline  - 55  -> 05/21/23: 50 pts  Higher score = better function   Goal status: Partially Met   3.  Pt will demo proper body mechanics in against gravity tasks and ADLs  work  tasks, fitness  to minimize straining pelvic floor / back                  Baseline: not IND, improper form that places strain on pelvic floor                Goal status: ongoing     4. Pt will demo levelled pelvic girdle and shoulder height in order to progress to deep core strengthening HEP and restore mobility at spine, pelvis, gait, posture   Baseline: L shoulder and iliac crest higher  Goal status:: MET  05/21/23 at 5th visit    5. Pt will report no more N/T upon waking in there B hands in order to improve ADL and QOL  Baseline: Pt has tingling and numbness in both her arms and to index and middle fingers on R hand, and pinky on the L hand in the morning.  Goal status: MET  05/21/23 at 5th visit   6. Pt will demo increased gait speed > 1.3 m/s with reciprocal gait  in order to ambulate safely in community and return to fitness routine  Baseline: short strides minimal hip flexion,push off, decreased stance on R, 1.10 m/s  Goal status:  MET at 5th visits 1.36 m/s ( reciporcal gait , NO NEED FOR SHOE LIFT)        Mariane Masters, PT 06/12/2023, 1:58 PM

## 2023-06-19 ENCOUNTER — Ambulatory Visit: Payer: Medicaid Other | Admitting: Physical Therapy

## 2023-06-19 DIAGNOSIS — M533 Sacrococcygeal disorders, not elsewhere classified: Secondary | ICD-10-CM | POA: Diagnosis not present

## 2023-06-19 DIAGNOSIS — M5459 Other low back pain: Secondary | ICD-10-CM

## 2023-06-19 DIAGNOSIS — R2689 Other abnormalities of gait and mobility: Secondary | ICD-10-CM

## 2023-06-19 DIAGNOSIS — R200 Anesthesia of skin: Secondary | ICD-10-CM

## 2023-06-19 NOTE — Therapy (Unsigned)
OUTPATIENT PHYSICAL THERAPY Treatment    Patient Name: Nicole Walter MRN: [id number] DOB:17-Nov-1960, 62 y.o., female Today's Date:05/01/23   PT End of Session - 06/19/23 1414     Visit Number 8    Number of Visits 17    Date for PT Re-Evaluation 08/21/23    Authorization Type Medicaid    PT Start Time 1333    PT Stop Time 1415    PT Time Calculation (min) 42 min    Activity Tolerance Patient tolerated treatment well;No increased pain    Behavior During Therapy [facility] for tasks assessed/performed              Past Medical History:  Diagnosis Date   Abnormal liver enzymes 02/08/2016   Allergy    Anxiety    Chest pain 07/05/2016   Chronic pain    Depression    DJD (degenerative joint disease)    lumbar   Fibromyalgia    High cholesterol    Rash of back 01/28/2016   Weight loss due to medication    Past Surgical History:  Procedure Laterality Date   ANKLE SURGERY     x 2   BREAST BIOPSY Left 1997   CHOLECYSTECTOMY     COLONOSCOPY     COLONOSCOPY WITH PROPOFOL N/A 02/22/2016   Procedure: COLONOSCOPY WITH PROPOFOL;  Surgeon: Christena Deem, MD;  Location: [facility] ENDOSCOPY;  Service: Endoscopy;  Laterality: N/A;   ESOPHAGOGASTRODUODENOSCOPY (EGD) WITH PROPOFOL N/A 02/22/2016   Procedure: ESOPHAGOGASTRODUODENOSCOPY (EGD) WITH PROPOFOL;  Surgeon: Christena Deem, MD;  Location: [facility] ENDOSCOPY;  Service: Endoscopy;  Laterality: N/A;   NASAL SINUS SURGERY     SHOULDER ARTHROSCOPY WITH ROTATOR CUFF REPAIR AND SUBACROMIAL DECOMPRESSION Left 09/23/2019   Procedure: SHOULDER ARTHROSCOPY WITH MINI ROTATOR CUFF REPAIR AND SUBACROMIAL DECOMPRESSION, BICEPS TENODESIS;  Surgeon: Signa Kell, MD;  Location: [facility] SURGERY CNTR;  Service: Orthopedics;  Laterality: Left;   TONSILLECTOMY     TUBAL LIGATION     Patient Active Problem List   Diagnosis Date Noted   Oral thrush 05/15/2023   Acute maxillary sinusitis 05/15/2023   Chronic radicular lumbar pain 03/20/2023   Exposure to  sexually transmitted disease (STD) 01/25/2023   Acute vaginitis 01/25/2023   Abnormal cervical Papanicolaou smear 01/25/2023   Abnormal MRI, cervical spine (09/04/2017) 07/22/2019   Neurogenic pain 07/22/2019   DDD (degenerative disc disease), cervical 06/09/2019   Chronic musculoskeletal pain 05/14/2019   Trypanophobia (Needle phobia) 09/26/2018   Spondylosis without myelopathy or radiculopathy, lumbar region 08/29/2018   Lumbar facet hypertrophy 08/29/2018   Lumbar facet syndrome (Right) 08/29/2018   DDD (degenerative disc disease), thoracic 08/29/2018   Thoracic spondylosis with radiculopathy (Right) 08/29/2018   Spinal enthesopathy of lumbar region (HCC) 08/29/2018   Chronic midline low back pain without sciatica 08/29/2018   Generalized anxiety disorder 07/23/2018   Cervical facet hypertrophy (Bilateral) 07/18/2018   Cervical central spinal stenosis 07/18/2018   Chronic shoulder pain (Left) 07/09/2018   Cervicalgia (Bilateral) (L>R) 03/28/2018   Spondylosis without myelopathy or radiculopathy, cervical region 02/11/2018   Chronic pelvic pain in female 08/24/2017   Fatty liver 08/24/2017   Chronic low back pain (Bilateral) (L>R) 08/13/2017   Cervical Foraminal Stenosis (Severe) (Bilateral: C5-6) 08/13/2017   Cervical spondylosis with radiculopathy (Bilateral) (L>R) 08/13/2017   Cervical sensory radiculopathy at C5 (Bilateral) 08/13/2017   Cervical radicular pain 08/13/2017   Constipation 07/16/2017   Myofascial pain syndrome, cervical (trapezius) (Left) 05/28/2017   Cervical facet syndrome (Bilateral) (L>R) 05/28/2017  Muscle spasm 05/15/2017   Chronic hip pain (Right) 03/19/2017   Chronic knee arthropathy (Right) 03/14/2017   Osteoarthritis of knee (Right) 02/21/2017   Osteoarthritis of shoulder (Right) 01/31/2017   Shortness of breath 01/15/2017   Thoracic spondylosis 01/01/2017   Radicular pain of thoracic region 12/21/2016   Osteoarthritis of hip (Bilateral) 11/20/2016    Greater trochanteric bursitis (Right) 09/18/2016   Subacromial bursitis of shoulder joint (Right) 09/18/2016   Overweight (BMI 25.0-29.9) 09/14/2016   Elevated sedimentation rate 08/31/2016   Elevated C-reactive protein (CRP) 08/31/2016   Long term current use of opiate analgesic 07/20/2016   Long term prescription opiate use 07/20/2016   Opiate use 07/20/2016   Chronic upper back pain (Secondary Area of Pain) (Bilateral) (L>R) 07/20/2016   Chronic shoulder pain (Tertiary Area of Pain) (Bilateral) (L>R) 07/20/2016   Chronic knee pain (Right) 07/20/2016   Chronic upper extremity pain (Left) 07/20/2016   Chronic lower extremity pain (Bilateral) (L>R) 07/20/2016   Chronic abdominal pain 07/20/2016   Chronic hip pain (Bilateral) (R>L) 05/31/2016   Chronic Greater trochanteric bursitis (Bilateral) (L>R) 05/31/2016   Bursitis of shoulder (Right) 05/17/2016   Skin lesions 04/27/2016   Hyperlipidemia 02/18/2016   Cervical spondylosis (Bilateral) 02/08/2016   Fibromyalgia 02/08/2016   Osteopenia 02/08/2016   Chronic neck pain (Primary Area of Pain) (Bilateral) (L>R) 01/28/2016   Concentration deficit 01/28/2016   Special screening for malignant neoplasms, vagina 01/14/2016   Lumbar spondylosis 01/06/2016   Anxiety and depression 07/11/2015   Chronic pain syndrome 07/11/2015   Combined fat and carbohydrate induced hyperlipemia 07/11/2015    PCP: Ellsworth Lennox   REFERRING PROVIDER:    [name] ( urge incontinence)   [name] ( cervical , thoracic, LBP, chronic pain)   REFERRING DIAG: Urge incontinence  Rationale for Evaluation and Treatment Rehabilitation  THERAPY DIAG:  Sacrococcygeal disorders, not elsewhere classified  Other low back pain  Numbness in both hands  Other abnormalities of gait and mobility  ONSET DATE:   SUBJECTIVE:            SUBJECTIVE STATEMENT :  Pt used the weed wacker and did back stretches and felt better. Pt still feels the R back  pain  SUBJECTIVE STATEMENT ON EVAL 04/03/23  : 1) Urge incontinence and SUI  : 3 out of 10 times, pt has had leakage before making it to the bathroom . Changes 2 pads per day.    2) neck pain, midback, R LBP:  From sitting position, pt feels there is locking from her R hip and low back. Pt had been provided a shoe lift 25 years ago and she used it for 5 years and it helped with pressure. Pt has not worn one since and was not given information about where to get more shoe lifts. Pt has no radiating pain currently. Pt feels she is leaning to the R. It takes her a few minute to stretch her back after sitting or driving   Pt has tingling and numbness in both her arms and to index and middle fingers on R hand, and pinky on the L hand in the morning. N/T does not occur with other activities.   PERTINENT HISTORY:  Tubal ligation,  L shoulder surgery, 2 surgeries on L ankle, 2 vaginal deliveries with vacuum and forceps   PAIN:  Are you having pain? Yes: see above   PRECAUTIONS: None  WEIGHT BEARING RESTRICTIONS: No  FALLS:  Has patient fallen in last 6 months? No  LIVING ENVIRONMENT: Lives with:  lives alone Lives in: House/apartment Stairs: No  OCCUPATION: caregiving and cleaning   PLOF: Independent  PATIENT GOALS:   Would like to stop hurting and return to physical activities     OBJECTIVE:                              HOME EXERCISE PROGRAM: See pt instruction section    ASSESSMENT:  CLINICAL IMPRESSION:  Pt has met 4/6 goals. Progressing well towards remaining goals.  Radiating LBP has improved  by 100%. Spine and pelvic girdle alignment is remaining levelled without shoe lift. Pt is compliant with HEP.   Today, provided excessive cues for gait , longer strides, and modified figure 4 stretch that did not cause pain, cued for lunge stance and using LKC stability with use of weedwacker , practiced simulated technique with weed wacker.     Modified figure-4 stretch  in a non-painful position. Pt demo'd IND. Instructed pt to discontinue the strap stretches due to pt's pain response with UE.    Provided  biopsychosocial approaches with graded movement, stretches for self-management of pain.    Plan on continuing with manual Tx to promote more fascial mobility over flank and abdomen next session Regional interdependent approaches will yield greater benefits.   Pt benefits from skilled PT.    OBJECTIVE IMPAIRMENTS decreased activity tolerance, decreased coordination, decreased endurance, decreased mobility, difficulty walking, decreased ROM, decreased strength, decreased safety awareness, hypomobility, increased muscle spasms, impaired flexibility, improper body mechanics, postural dysfunction, and pain. scar restrictions   ACTIVITY LIMITATIONS  self-care,  sleep, home chores, work tasks    PARTICIPATION LIMITATIONS:  community, gym activities    PERSONAL FACTORS       are also affecting patient's functional outcome.    REHAB POTENTIAL: Good   CLINICAL DECISION MAKING: Evolving/moderate complexity   EVALUATION COMPLEXITY: Moderate    PATIENT EDUCATION:    Education details: Showed pt anatomy images. Explained muscles attachments/ connection, physiology of deep core system/ spinal- thoracic-pelvis-lower kinetic chain as they relate to pt's presentation, Sx, and past Hx. Explained what and how these areas of deficits need to be restored to balance and function    See Therapeutic activity / neuromuscular re-education section  Answered pt's questions.   Person educated: Patient Education method: Explanation, Demonstration, Tactile cues, Verbal cues, and Handouts Education comprehension: verbalized understanding, returned demonstration, verbal cues required, tactile cues required, and needs further education     PLAN: PT FREQUENCY: 1x/week   PT DURATION: 10 weeks   PLANNED INTERVENTIONS: Therapeutic exercises, Therapeutic activity,  Neuromuscular re-education, Balance training, Gait training, Patient/Family education, Self Care, Joint mobilization, Spinal mobilization, Moist heat, Taping, and Manual therapy, dry needling.   PLAN FOR NEXT SESSION: See clinical impression for plan     GOALS: Goals reviewed with patient? Yes  SHORT TERM GOALS: Target date: 05/01/2023    Pt will demo IND with HEP                    Baseline: Not IND            Goal status: MET   LONG TERM GOALS: Target date: 08/21/2023    1.Pt will demo proper deep core coordination without chest breathing and optimal excursion of diaphragm/pelvic floor in order to promote spinal stability and pelvic floor function  Baseline: dyscoordination Goal status:  MET   2.  Pt will demo > 5 pt change on FOTO  to improve QOL and function    Urinary Problem baseline-   59  Higher score = better function    Lumber baseline  - 55  -> 05/21/23: 50 pts  Higher score = better function   Goal status: Partially Met   3.  Pt will demo proper body mechanics in against gravity tasks and ADLs  work tasks, fitness  to minimize straining pelvic floor / back                  Baseline: not IND, improper form that places strain on pelvic floor                Goal status: ongoing     4. Pt will demo levelled pelvic girdle and shoulder height in order to progress to deep core strengthening HEP and restore mobility at spine, pelvis, gait, posture   Baseline: L shoulder and iliac crest higher  Goal status:: MET  05/21/23 at 5th visit    5. Pt will report no more N/T upon waking in there B hands in order to improve ADL and QOL  Baseline: Pt has tingling and numbness in both her arms and to index and middle fingers on R hand, and pinky on the L hand in the morning.  Goal status: MET  05/21/23 at 5th visit   6. Pt will demo increased gait speed > 1.3 m/s with reciprocal gait  in order to ambulate safely in community and return to fitness routine  Baseline: short  strides minimal hip flexion,push off, decreased stance on R, 1.10 m/s  Goal status:  MET at 5th visits 1.36 m/s ( reciporcal gait , NO NEED FOR SHOE LIFT)        Mariane Masters, PT 06/19/2023, 2:15 PM

## 2023-06-20 ENCOUNTER — Ambulatory Visit: Payer: Medicaid Other | Admitting: Internal Medicine

## 2023-06-20 VITALS — BP 130/85 | HR 88 | Ht 64.0 in | Wt 160.8 lb

## 2023-06-20 DIAGNOSIS — M5441 Lumbago with sciatica, right side: Secondary | ICD-10-CM

## 2023-06-20 DIAGNOSIS — K760 Fatty (change of) liver, not elsewhere classified: Secondary | ICD-10-CM

## 2023-06-20 DIAGNOSIS — G8929 Other chronic pain: Secondary | ICD-10-CM

## 2023-06-20 DIAGNOSIS — G894 Chronic pain syndrome: Secondary | ICD-10-CM

## 2023-06-20 DIAGNOSIS — M5442 Lumbago with sciatica, left side: Secondary | ICD-10-CM | POA: Diagnosis not present

## 2023-06-20 MED ORDER — OXYCODONE HCL 5 MG PO TABS
5.0000 mg | ORAL_TABLET | Freq: Four times a day (QID) | ORAL | 0 refills | Status: DC | PRN
Start: 2023-06-20 — End: 2023-07-20

## 2023-06-20 NOTE — Progress Notes (Signed)
Established Patient Office Visit  Subjective:  Patient ID: Nicole Walter, female    DOB: September 05, 1961  Age: 62 y.o. MRN: [id number]  Chief Complaint  Patient presents with   Follow-up    Pain Management    Here for pain management follow up. Chronic pain well controlled on current analgesia. Last UDS satisfactory and pill counts have also been satisfactory.   No other concerns at this time.   Past Medical History:  Diagnosis Date   Abnormal liver enzymes 02/08/2016   Allergy    Anxiety    Chest pain 07/05/2016   Chronic pain    Depression    DJD (degenerative joint disease)    lumbar   Fibromyalgia    High cholesterol    Rash of back 01/28/2016   Weight loss due to medication     Past Surgical History:  Procedure Laterality Date   ANKLE SURGERY     x 2   BREAST BIOPSY Left 1997   CHOLECYSTECTOMY     COLONOSCOPY     COLONOSCOPY WITH PROPOFOL N/A 02/22/2016   Procedure: COLONOSCOPY WITH PROPOFOL;  Surgeon: Christena Deem, MD;  Location: [facility] ENDOSCOPY;  Service: Endoscopy;  Laterality: N/A;   ESOPHAGOGASTRODUODENOSCOPY (EGD) WITH PROPOFOL N/A 02/22/2016   Procedure: ESOPHAGOGASTRODUODENOSCOPY (EGD) WITH PROPOFOL;  Surgeon: Christena Deem, MD;  Location: [facility] ENDOSCOPY;  Service: Endoscopy;  Laterality: N/A;   NASAL SINUS SURGERY     SHOULDER ARTHROSCOPY WITH ROTATOR CUFF REPAIR AND SUBACROMIAL DECOMPRESSION Left 09/23/2019   Procedure: SHOULDER ARTHROSCOPY WITH MINI ROTATOR CUFF REPAIR AND SUBACROMIAL DECOMPRESSION, BICEPS TENODESIS;  Surgeon: Signa Kell, MD;  Location: [facility]ERY [facility];  Service: Orthopedics;  Laterality: Left;   TONSILLECTOMY     TUBAL LIGATION      Social History   Socioeconomic History   Marital status: Legally Separated    Spouse name: Not on file   Number of children: Not on file   Years of education: Not on file   Highest education level: Not on file  Occupational History   Not on file  Tobacco Use   Smoking status: Every Day     Current packs/day: 0.00    Average packs/day: 1 pack/day for 41.3 years (41.3 ttl pk-yrs)    Types: E-cigarettes, Cigarettes    Start date: 03/06/1974    Last attempt to quit: 07/08/2015    Years since quitting: 7.9   Smokeless tobacco: Never   Tobacco comments:    vapor cigarettes, no nicotene  Vaping Use   Vaping status: Every Day   Start date: 07/08/2015   Substances: Flavoring   Devices: [employer]  Substance and Sexual Activity   Alcohol use: No    Alcohol/week: 0.0 standard drinks of alcohol   Drug use: No   Sexual activity: Not Currently    Birth control/protection: Post-menopausal  Other Topics Concern   Not on file  Social History Narrative   Not on file   Social Determinants of Health   Financial Resource Strain: Not on file  Food Insecurity: Not on file  Transportation Needs: Not on file  Physical Activity: Not on file  Stress: Not on file  Social Connections: Not on file  Intimate Partner Violence: Not on file    Family History  Problem Relation Age of Onset   Heart disease Mother    Stroke Mother    Cancer Father        lung   Arthritis Father    Arthritis Paternal Grandmother  Arthritis Paternal Grandfather    Drug abuse Sister    Drug abuse Brother    Post-traumatic stress disorder Brother    Diabetes Sister    Dementia Sister    Drug abuse Brother    Cancer Brother     Allergies  Allergen Reactions   Other Hives and Other (See Comments)    Pt states she was tested for allergies and peas was one that she is allergic to but has never ate enough of them to see a reaction. Pt states when she is around bird feathers she gets the hives FEATHERS   Adhesive [Tape] Hives   Food     PEAS/ patient does not know what type reaction she has/ from allergy test during childhood   Pea Other (See Comments)    Other reaction(Chaunda Vandergriff): Unknown.  Pt states she was tested for allergies and peas was one that she is allergic to but has never ate enough of them to see a  reaction.    Review of Systems  Constitutional: Negative.        Objective:   BP 130/85   Pulse 88   Ht 5\' 4"  (1.626 m)   Wt 160 lb 12.8 oz (72.9 kg)   SpO2 98%   BMI 27.60 kg/m   Vitals:   06/20/23 0906  BP: 130/85  Pulse: 88  Height: 5\' 4"  (1.626 m)  Weight: 160 lb 12.8 oz (72.9 kg)  SpO2: 98%  BMI (Calculated): 27.59    Physical Exam Vitals reviewed.  Constitutional:      General: She is not in acute distress. HENT:     Head: Normocephalic.     Nose: Nose normal.     Mouth/Throat:     Mouth: Mucous membranes are moist.  Eyes:     Extraocular Movements: Extraocular movements intact.     Pupils: Pupils are equal, round, and reactive to light.  Cardiovascular:     Rate and Rhythm: Normal rate and regular rhythm.     Heart sounds: No murmur heard. Pulmonary:     Effort: Pulmonary effort is normal.     Breath sounds: No rhonchi or rales.  Abdominal:     General: Abdomen is flat.     Palpations: There is no hepatomegaly, splenomegaly or mass.  Musculoskeletal:        General: Normal range of motion.     Cervical back: Normal range of motion. No tenderness.  Skin:    General: Skin is warm and dry.  Neurological:     General: No focal deficit present.     Mental Status: She is alert and oriented to person, place, and time.     Cranial Nerves: No cranial nerve deficit.     Motor: No weakness.  Psychiatric:        Mood and Affect: Mood normal.        Behavior: Behavior normal.      No results found for any visits on 06/20/23.  Recent Results (from the past 2160 hour(Nicole Walter))  CBC     Status: None   Collection Time: 05/11/23 10:24 AM  Result Value Ref Range   WBC 9.5 4.0 - 10.5 K/uL   RBC 4.64 3.87 - 5.11 MIL/uL   Hemoglobin 13.6 12.0 - 15.0 g/dL   HCT 16.1 09.6 - 04.5 %   MCV 87.7 80.0 - 100.0 fL   MCH 29.3 26.0 - 34.0 pg   MCHC 33.4 30.0 - 36.0 g/dL   RDW 40.9 81.1 -  15.5 %   Platelets 276 150 - 400 K/uL   nRBC 0.0 0.0 - 0.2 %    Comment:  Performed at [facility], [address] [place], [place] [address]  Basic metabolic panel     Status: None   Collection Time: 05/11/23 10:24 AM  Result Value Ref Range   Sodium 137 135 - 145 mmol/L   Potassium 3.9 3.5 - 5.1 mmol/L   Chloride 104 98 - 111 mmol/L   CO2 26 22 - 32 mmol/L   Glucose, Bld 95 70 - 99 mg/dL    Comment: Glucose reference range applies only to samples taken after fasting for at least 8 hours.   BUN 12 8 - 23 mg/dL   Creatinine, Ser 1.30 0.44 - 1.00 mg/dL   Calcium 9.5 8.9 - 86.5 mg/dL   GFR, Estimated >78 >46 mL/min    Comment: (NOTE) Calculated using the CKD-EPI Creatinine Equation (2021)    Anion gap 7 5 - 15    Comment: Performed at [facility], [address] [place], [place] [address]      Assessment & Plan:  As per problem list  Problem List Items Addressed This Visit       Digestive   Fatty liver - Primary     Nervous and Auditory   Chronic low back pain (Bilateral) (L>R) (Chronic)     Other   Chronic pain syndrome (Chronic)    No follow-ups on file.   Total time spent: 20 minutes  Luna Fuse, MD  06/20/2023   This document may have been prepared by [facility] Voice Recognition software and as such may include unintentional dictation errors.

## 2023-07-19 ENCOUNTER — Telehealth: Payer: Self-pay | Admitting: Physical Therapy

## 2023-07-20 ENCOUNTER — Other Ambulatory Visit: Payer: Self-pay | Admitting: Internal Medicine

## 2023-07-20 ENCOUNTER — Encounter: Payer: Self-pay | Admitting: Internal Medicine

## 2023-07-20 ENCOUNTER — Ambulatory Visit: Payer: Medicaid Other | Admitting: Internal Medicine

## 2023-07-20 DIAGNOSIS — M5441 Lumbago with sciatica, right side: Secondary | ICD-10-CM | POA: Diagnosis not present

## 2023-07-20 DIAGNOSIS — G8929 Other chronic pain: Secondary | ICD-10-CM

## 2023-07-20 DIAGNOSIS — J302 Other seasonal allergic rhinitis: Secondary | ICD-10-CM

## 2023-07-20 DIAGNOSIS — K5900 Constipation, unspecified: Secondary | ICD-10-CM

## 2023-07-20 DIAGNOSIS — M5442 Lumbago with sciatica, left side: Secondary | ICD-10-CM | POA: Diagnosis not present

## 2023-07-20 MED ORDER — CETIRIZINE HCL 10 MG PO TABS
10.0000 mg | ORAL_TABLET | Freq: Every morning | ORAL | 2 refills | Status: DC
Start: 2023-07-20 — End: 2023-10-15

## 2023-07-20 MED ORDER — OXYCODONE HCL 5 MG PO TABS
5.0000 mg | ORAL_TABLET | Freq: Four times a day (QID) | ORAL | 0 refills | Status: DC | PRN
Start: 2023-07-20 — End: 2023-08-20

## 2023-07-20 MED ORDER — AZELASTINE HCL 137 MCG/SPRAY NA SOLN
1.0000 | Freq: Every day | NASAL | 2 refills | Status: DC
Start: 2023-07-20 — End: 2023-11-19

## 2023-07-20 MED ORDER — LINZESS 72 MCG PO CAPS: ORAL_CAPSULE | ORAL | 3 refills | Status: DC

## 2023-07-20 NOTE — Progress Notes (Signed)
Established Patient Office Visit  Subjective:  Patient ID: Nicole Walter, female    DOB: 04-04-1961  Age: 62 y.o. MRN: [id number]  Chief Complaint  Patient presents with   Pain Management    Here for pain management follow up. Chronic pain well controlled on current analgesia. Last UDS satisfactory and pill counts have also been satisfactory. Awaits follow up with pain specialist after she completes PT.   No other concerns at this time.   Past Medical History:  Diagnosis Date   Abnormal liver enzymes 02/08/2016   Allergy    Anxiety    Chest pain 07/05/2016   Chronic pain    Depression    DJD (degenerative joint disease)    lumbar   Fibromyalgia    High cholesterol    Rash of back 01/28/2016   Weight loss due to medication     Past Surgical History:  Procedure Laterality Date   ANKLE SURGERY     x 2   BREAST BIOPSY Left 1997   CHOLECYSTECTOMY     COLONOSCOPY     COLONOSCOPY WITH PROPOFOL N/A 02/22/2016   Procedure: COLONOSCOPY WITH PROPOFOL;  Surgeon: Christena Deem, MD;  Location: [facility] ENDOSCOPY;  Service: Endoscopy;  Laterality: N/A;   ESOPHAGOGASTRODUODENOSCOPY (EGD) WITH PROPOFOL N/A 02/22/2016   Procedure: ESOPHAGOGASTRODUODENOSCOPY (EGD) WITH PROPOFOL;  Surgeon: Christena Deem, MD;  Location: [facility] ENDOSCOPY;  Service: Endoscopy;  Laterality: N/A;   NASAL SINUS SURGERY     SHOULDER ARTHROSCOPY WITH ROTATOR CUFF REPAIR AND SUBACROMIAL DECOMPRESSION Left 09/23/2019   Procedure: SHOULDER ARTHROSCOPY WITH MINI ROTATOR CUFF REPAIR AND SUBACROMIAL DECOMPRESSION, BICEPS TENODESIS;  Surgeon: Signa Kell, MD;  Location: [facility];  Service: Orthopedics;  Laterality: Left;   TONSILLECTOMY     TUBAL LIGATION      Social History   Socioeconomic History   Marital status: Legally Separated    Spouse name: Not on file   Number of children: Not on file   Years of education: Not on file   Highest education level: Not on file  Occupational History   Not on  file  Tobacco Use   Smoking status: Every Day    Current packs/day: 0.00    Average packs/day: 1 pack/day for 41.3 years (41.3 ttl pk-yrs)    Types: E-cigarettes, Cigarettes    Start date: 03/06/1974    Last attempt to quit: 07/08/2015    Years since quitting: 8.0   Smokeless tobacco: Never   Tobacco comments:    vapor cigarettes, no nicotene  Vaping Use   Vaping status: Every Day   Start date: 07/08/2015   Substances: Flavoring   Devices: Nort, Geek  Substance and Sexual Activity   Alcohol use: No    Alcohol/week: 0.0 standard drinks of alcohol   Drug use: No   Sexual activity: Not Currently    Birth control/protection: Post-menopausal  Other Topics Concern   Not on file  Social History Narrative   Not on file   Social Determinants of Health   Financial Resource Strain: Not on file  Food Insecurity: Not on file  Transportation Needs: Not on file  Physical Activity: Not on file  Stress: Not on file  Social Connections: Not on file  Intimate Partner Violence: Not on file    Family History  Problem Relation Age of Onset   Heart disease Mother    Stroke Mother    Cancer Father        lung   Arthritis Father  Arthritis Paternal Grandmother    Arthritis Paternal Grandfather    Drug abuse Sister    Drug abuse Brother    Post-traumatic stress disorder Brother    Diabetes Sister    Dementia Sister    Drug abuse Brother    Cancer Brother     Allergies  Allergen Reactions   Other Hives and Other (See Comments)    Pt states she was tested for allergies and peas was one that she is allergic to but has never ate enough of them to see a reaction. Pt states when she is around bird feathers she gets the hives FEATHERS   Adhesive [Tape] Hives   Food     PEAS/ patient does not know what type reaction she has/ from allergy test during childhood   Pea Other (See Comments)    Other reaction(Tekeisha Hakim): Unknown.  Pt states she was tested for allergies and peas was one that she is  allergic to but has never ate enough of them to see a reaction.    Review of Systems  Constitutional: Negative.   HENT: Negative.    Eyes: Negative.   Respiratory: Negative.    Cardiovascular: Negative.   Gastrointestinal: Negative.   Genitourinary: Negative.        Urge incontinence  Musculoskeletal:  Positive for back pain, joint pain and neck pain.       As in hpi  Skin: Negative.   Neurological: Negative.   Endo/Heme/Allergies: Negative.        Objective:   BP (!) 142/73   Pulse 83   Ht 5\' 4"  (1.626 m)   Wt 163 lb 3.2 oz (74 kg)   SpO2 98%   BMI 28.01 kg/m   Vitals:   07/20/23 1341  BP: (!) 142/73  Pulse: 83  Height: 5\' 4"  (1.626 m)  Weight: 163 lb 3.2 oz (74 kg)  SpO2: 98%  BMI (Calculated): 28    Physical Exam Vitals reviewed.  Constitutional:      General: She is not in acute distress. HENT:     Head: Normocephalic.     Nose: Nose normal.     Mouth/Throat:     Mouth: Mucous membranes are moist.  Eyes:     Extraocular Movements: Extraocular movements intact.     Pupils: Pupils are equal, round, and reactive to light.  Cardiovascular:     Rate and Rhythm: Normal rate and regular rhythm.     Heart sounds: No murmur heard. Pulmonary:     Effort: Pulmonary effort is normal.     Breath sounds: No rhonchi or rales.  Abdominal:     General: Abdomen is flat.     Palpations: There is no hepatomegaly, splenomegaly or mass.  Musculoskeletal:        General: Normal range of motion.     Cervical back: Normal range of motion. No tenderness.  Skin:    General: Skin is warm and dry.  Neurological:     General: No focal deficit present.     Mental Status: She is alert and oriented to person, place, and time.     Cranial Nerves: No cranial nerve deficit.     Motor: No weakness.  Psychiatric:        Mood and Affect: Mood normal.        Behavior: Behavior normal.      No results found for any visits on 07/20/23.  Recent Results (from the past 2160  hour(Paitynn Mikus))  CBC  Status: None   Collection Time: 05/11/23 10:24 AM  Result Value Ref Range   WBC 9.5 4.0 - 10.5 K/uL   RBC 4.64 3.87 - 5.11 MIL/uL   Hemoglobin 13.6 12.0 - 15.0 g/dL   HCT 69.6 29.5 - 28.4 %   MCV 87.7 80.0 - 100.0 fL   MCH 29.3 26.0 - 34.0 pg   MCHC 33.4 30.0 - 36.0 g/dL   RDW 13.2 44.0 - 10.2 %   Platelets 276 150 - 400 K/uL   nRBC 0.0 0.0 - 0.2 %    Comment: Performed at [facility], [address], [place], [place] [address]  Basic metabolic panel     Status: None   Collection Time: 05/11/23 10:24 AM  Result Value Ref Range   Sodium 137 135 - 145 mmol/L   Potassium 3.9 3.5 - 5.1 mmol/L   Chloride 104 98 - 111 mmol/L   CO2 26 22 - 32 mmol/L   Glucose, Bld 95 70 - 99 mg/dL    Comment: Glucose reference range applies only to samples taken after fasting for at least 8 hours.   BUN 12 8 - 23 mg/dL   Creatinine, Ser 6.44 0.44 - 1.00 mg/dL   Calcium 9.5 8.9 - 03.4 mg/dL   GFR, Estimated >74 >25 mL/min    Comment: (NOTE) Calculated using the CKD-EPI Creatinine Equation (2021)    Anion gap 7 5 - 15    Comment: Performed at [facility], [address] [place], [place] [address]      Assessment & Plan:  As per problem list. Problem List Items Addressed This Visit       Nervous and Auditory   Chronic low back pain (Bilateral) (L>R) (Chronic)   Relevant Medications   oxyCODONE (OXY IR/ROXICODONE) 5 MG immediate release tablet     Other   Constipation   Relevant Medications   LINZESS 72 MCG capsule   Other Visit Diagnoses     Seasonal allergic rhinitis, unspecified trigger       Relevant Medications   Azelastine HCl 137 MCG/SPRAY SOLN   cetirizine (ZYRTEC) 10 MG tablet       Return in about 1 month (around 08/19/2023) for Pain Management.   Total time spent: 20 minutes  Luna Fuse, MD  07/20/2023   This document may have been prepared by [facility] Voice Recognition software and as such may include  unintentional dictation errors.

## 2023-08-02 ENCOUNTER — Telehealth: Payer: Self-pay

## 2023-08-02 ENCOUNTER — Ambulatory Visit: Payer: Medicaid Other | Attending: Internal Medicine | Admitting: Physical Therapy

## 2023-08-02 DIAGNOSIS — M2170 Unequal limb length (acquired), unspecified site: Secondary | ICD-10-CM | POA: Diagnosis present

## 2023-08-02 DIAGNOSIS — R2689 Other abnormalities of gait and mobility: Secondary | ICD-10-CM | POA: Insufficient documentation

## 2023-08-02 DIAGNOSIS — M5459 Other low back pain: Secondary | ICD-10-CM | POA: Insufficient documentation

## 2023-08-02 DIAGNOSIS — M533 Sacrococcygeal disorders, not elsewhere classified: Secondary | ICD-10-CM | POA: Insufficient documentation

## 2023-08-02 DIAGNOSIS — R200 Anesthesia of skin: Secondary | ICD-10-CM | POA: Insufficient documentation

## 2023-08-02 NOTE — Telephone Encounter (Signed)
Patient verbalized understanding  

## 2023-08-20 ENCOUNTER — Encounter: Payer: Self-pay | Admitting: Internal Medicine

## 2023-08-20 ENCOUNTER — Ambulatory Visit: Payer: Medicaid Other | Admitting: Internal Medicine

## 2023-08-20 VITALS — BP 123/79 | HR 70 | Ht 64.0 in | Wt 161.6 lb

## 2023-08-20 DIAGNOSIS — E663 Overweight: Secondary | ICD-10-CM

## 2023-08-20 DIAGNOSIS — G5701 Lesion of sciatic nerve, right lower limb: Secondary | ICD-10-CM

## 2023-08-20 DIAGNOSIS — Z0130 Encounter for examination of blood pressure without abnormal findings: Secondary | ICD-10-CM

## 2023-08-20 DIAGNOSIS — G8929 Other chronic pain: Secondary | ICD-10-CM

## 2023-08-20 DIAGNOSIS — M5442 Lumbago with sciatica, left side: Secondary | ICD-10-CM | POA: Diagnosis not present

## 2023-08-20 DIAGNOSIS — M5441 Lumbago with sciatica, right side: Secondary | ICD-10-CM

## 2023-08-20 DIAGNOSIS — F419 Anxiety disorder, unspecified: Secondary | ICD-10-CM | POA: Diagnosis not present

## 2023-08-20 DIAGNOSIS — F32A Depression, unspecified: Secondary | ICD-10-CM

## 2023-08-20 MED ORDER — PHENDIMETRAZINE TARTRATE ER 105 MG PO CP24
1.0000 | ORAL_CAPSULE | Freq: Every day | ORAL | 0 refills | Status: DC
Start: 2023-08-20 — End: 2023-09-25

## 2023-08-20 MED ORDER — METAXALONE 800 MG PO TABS
800.0000 mg | ORAL_TABLET | Freq: Three times a day (TID) | ORAL | 0 refills | Status: AC
Start: 2023-08-20 — End: 2023-09-19

## 2023-08-20 MED ORDER — OXYCODONE HCL 5 MG PO TABS
5.0000 mg | ORAL_TABLET | Freq: Four times a day (QID) | ORAL | 0 refills | Status: DC | PRN
Start: 2023-08-20 — End: 2023-09-19

## 2023-08-20 NOTE — Progress Notes (Signed)
Established Patient Office Visit  Subjective:  Patient ID: Nicole Walter, female    DOB: [birth year]  Age: 62 y.o. MRN: [id number]  No chief complaint on file.   Here for pain management follow up. Chronic pain well controlled on current analgesia. Last UDS satisfactory and pill counts have also been satisfactory. C/o exacerbation of sciatic pain whenever she wears a low belt.  No other concerns at this time.   Past Medical History:  Diagnosis Date   Abnormal liver enzymes 02/08/2016   Allergy    Anxiety    Chest pain 07/05/2016   Chronic pain    Depression    DJD (degenerative joint disease)    lumbar   Fibromyalgia    High cholesterol    Rash of back 01/28/2016   Weight loss due to medication     Past Surgical History:  Procedure Laterality Date   ANKLE SURGERY     x 2   BREAST BIOPSY Left 1997   CHOLECYSTECTOMY     COLONOSCOPY     COLONOSCOPY WITH PROPOFOL N/A 02/22/2016   Procedure: COLONOSCOPY WITH PROPOFOL;  Surgeon: Nicole Deem, MD;  Location: [facility] ENDOSCOPY;  Service: Endoscopy;  Laterality: N/A;   ESOPHAGOGASTRODUODENOSCOPY (EGD) WITH PROPOFOL N/A 02/22/2016   Procedure: ESOPHAGOGASTRODUODENOSCOPY (EGD) WITH PROPOFOL;  Surgeon: Nicole Deem, MD;  Location: [facility] ENDOSCOPY;  Service: Endoscopy;  Laterality: N/A;   NASAL SINUS SURGERY     SHOULDER ARTHROSCOPY WITH ROTATOR CUFF REPAIR AND SUBACROMIAL DECOMPRESSION Left 09/23/2019   Procedure: SHOULDER ARTHROSCOPY WITH MINI ROTATOR CUFF REPAIR AND SUBACROMIAL DECOMPRESSION, BICEPS TENODESIS;  Surgeon: Nicole Kell, MD;  Location: [facility];  Service: Orthopedics;  Laterality: Left;   TONSILLECTOMY     TUBAL LIGATION      Social History   Socioeconomic History   Marital status: Legally Separated    Spouse name: Not on file   Number of children: Not on file   Years of education: Not on file   Highest education level: Not on file  Occupational History   Not on file  Tobacco Use   Smoking  status: Every Day    Current packs/day: 0.00    Average packs/day: 1 pack/day for 41.3 years (41.3 ttl pk-yrs)    Types: E-cigarettes, Cigarettes    Start date: 03/06/1974    Last attempt to quit: 07/08/2015    Years since quitting: 8.1   Smokeless tobacco: Never   Tobacco comments:    vapor cigarettes, no nicotene  Vaping Use   Vaping status: Every Day   Start date: 07/08/2015   Substances: Flavoring   Devices: Nort, Geek  Substance and Sexual Activity   Alcohol use: No    Alcohol/week: 0.0 standard drinks of alcohol   Drug use: No   Sexual activity: Not Currently    Birth control/protection: Post-menopausal  Other Topics Concern   Not on file  Social History Narrative   Not on file   Social Determinants of Health   Financial Resource Strain: Not on file  Food Insecurity: Not on file  Transportation Needs: Not on file  Physical Activity: Not on file  Stress: Not on file  Social Connections: Not on file  Intimate Partner Violence: Not on file    Family History  Problem Relation Age of Onset   Heart disease Mother    Stroke Mother    Cancer Father        lung   Arthritis Father    Arthritis Paternal Grandmother  Arthritis Paternal Grandfather    Drug abuse Sister    Drug abuse Brother    Post-traumatic stress disorder Brother    Diabetes Sister    Dementia Sister    Drug abuse Brother    Cancer Brother     Allergies  Allergen Reactions   Other Hives and Other (See Comments)    Pt states she was tested for allergies and peas was one that she is allergic to but has never ate enough of them to see a reaction. Pt states when she is around bird Nicole Walter she gets the hives Nicole Walter   Adhesive [Tape] Hives   Food     PEAS/ patient does not know what type reaction she has/ from allergy test during childhood   Pea Other (See Comments)    Other reaction(Nicole Walter): Unknown.  Pt states she was tested for allergies and peas was one that she is allergic to but has never ate  enough of them to see a reaction.    Review of Systems  Constitutional: Negative.   HENT: Negative.    Eyes: Negative.   Respiratory: Negative.    Cardiovascular: Negative.   Gastrointestinal: Negative.   Genitourinary: Negative.        Urge incontinence  Musculoskeletal:  Positive for back pain, joint pain and neck pain.       As in hpi  Skin: Negative.   Neurological: Negative.   Endo/Heme/Allergies: Negative.        Objective:   BP 123/79   Pulse 70   Ht 5\' 4"  (1.626 m)   Wt 161 lb 9.6 oz (73.3 kg)   SpO2 97%   BMI 27.74 kg/m   Vitals:   08/20/23 1018  BP: 123/79  Pulse: 70  Height: 5\' 4"  (1.626 m)  Weight: 161 lb 9.6 oz (73.3 kg)  SpO2: 97%  BMI (Calculated): 27.72    Physical Exam Vitals reviewed.  Constitutional:      General: She is not in acute distress. HENT:     Head: Normocephalic.     Nose: Nose normal.     Mouth/Throat:     Mouth: Mucous membranes are moist.  Eyes:     Extraocular Movements: Extraocular movements intact.     Pupils: Pupils are equal, round, and reactive to light.  Cardiovascular:     Rate and Rhythm: Normal rate and regular rhythm.     Heart sounds: No murmur heard. Pulmonary:     Effort: Pulmonary effort is normal.     Breath sounds: No rhonchi or rales.  Abdominal:     General: Abdomen is flat.     Palpations: There is no hepatomegaly, splenomegaly or mass.  Musculoskeletal:        General: Normal range of motion.     Cervical back: Normal range of motion. No tenderness.  Skin:    General: Skin is warm and dry.  Neurological:     General: No focal deficit present.     Mental Status: She is alert and oriented to person, place, and time.     Cranial Nerves: No cranial nerve deficit.     Motor: No weakness.  Psychiatric:        Mood and Affect: Mood normal.        Behavior: Behavior normal.      No results found for any visits on 08/20/23.  No results found for this or any previous visit (from the past 2160  hour(Nicole Walter)).    Assessment & Plan:  As per problem list  Problem List Items Addressed This Visit       Nervous and Auditory   Chronic low back pain (Bilateral) (L>R) - Primary (Chronic)   Relevant Medications   oxyCODONE (OXY IR/ROXICODONE) 5 MG immediate release tablet   Phendimetrazine Tartrate 105 MG CP24   metaxalone (SKELAXIN) 800 MG tablet   Piriformis syndrome of right side   Relevant Medications   Phendimetrazine Tartrate 105 MG CP24   metaxalone (SKELAXIN) 800 MG tablet     Other   Anxiety and depression   Overweight (BMI 25.0-29.9)   Relevant Medications   Phendimetrazine Tartrate 105 MG CP24    Return in about 1 month (around 09/20/2023).   Total time spent: 20 minutes  Luna Fuse, MD  08/20/2023   This document may have been prepared by [facility] Voice Recognition software and as such may include unintentional dictation errors.

## 2023-08-21 ENCOUNTER — Telehealth: Payer: Self-pay

## 2023-08-21 DIAGNOSIS — G894 Chronic pain syndrome: Secondary | ICD-10-CM

## 2023-08-21 DIAGNOSIS — G8929 Other chronic pain: Secondary | ICD-10-CM

## 2023-08-21 DIAGNOSIS — M5412 Radiculopathy, cervical region: Secondary | ICD-10-CM

## 2023-08-21 DIAGNOSIS — M542 Cervicalgia: Secondary | ICD-10-CM

## 2023-08-21 DIAGNOSIS — M5414 Radiculopathy, thoracic region: Secondary | ICD-10-CM

## 2023-08-21 DIAGNOSIS — M5134 Other intervertebral disc degeneration, thoracic region: Secondary | ICD-10-CM

## 2023-08-21 NOTE — Telephone Encounter (Signed)
The patient called and said she has completed PT. She wants me to proceed with the MRI prior auth. Can you put in the orders? She hasn't been seen here since May and all orders have expired

## 2023-08-22 ENCOUNTER — Telehealth: Payer: Self-pay | Admitting: Internal Medicine

## 2023-08-22 NOTE — Telephone Encounter (Signed)
Patient left VM requesting something for anxiety be sent in for when she has her upcoming 3 MRI's on 10/21. Please advise.

## 2023-08-24 ENCOUNTER — Other Ambulatory Visit: Payer: Self-pay | Admitting: Internal Medicine

## 2023-08-24 DIAGNOSIS — F419 Anxiety disorder, unspecified: Secondary | ICD-10-CM

## 2023-08-24 MED ORDER — LORAZEPAM 0.5 MG PO TABS
0.5000 mg | ORAL_TABLET | Freq: Once | ORAL | 0 refills | Status: AC
Start: 2023-08-24 — End: 2023-08-24

## 2023-08-27 ENCOUNTER — Ambulatory Visit
Admission: RE | Admit: 2023-08-27 | Discharge: 2023-08-27 | Disposition: A | Discharge status: Home / Self Care | Payer: Medicaid Other | Source: Ambulatory Visit | Attending: Student in an Organized Health Care Education/Training Program | Admitting: Student in an Organized Health Care Education/Training Program

## 2023-08-27 DIAGNOSIS — M5134 Other intervertebral disc degeneration, thoracic region: Secondary | ICD-10-CM

## 2023-08-27 DIAGNOSIS — M542 Cervicalgia: Secondary | ICD-10-CM | POA: Insufficient documentation

## 2023-08-27 DIAGNOSIS — G894 Chronic pain syndrome: Secondary | ICD-10-CM | POA: Diagnosis present

## 2023-08-27 DIAGNOSIS — M5414 Radiculopathy, thoracic region: Secondary | ICD-10-CM | POA: Insufficient documentation

## 2023-08-27 DIAGNOSIS — M5416 Radiculopathy, lumbar region: Secondary | ICD-10-CM | POA: Insufficient documentation

## 2023-08-27 DIAGNOSIS — M5412 Radiculopathy, cervical region: Secondary | ICD-10-CM | POA: Insufficient documentation

## 2023-08-27 DIAGNOSIS — G8929 Other chronic pain: Secondary | ICD-10-CM | POA: Insufficient documentation

## 2023-09-03 ENCOUNTER — Ambulatory Visit: Payer: Medicaid Other | Admitting: Internal Medicine

## 2023-09-03 VITALS — BP 130/75 | HR 69 | Ht 64.0 in | Wt 162.8 lb

## 2023-09-03 DIAGNOSIS — Z0130 Encounter for examination of blood pressure without abnormal findings: Secondary | ICD-10-CM

## 2023-09-03 DIAGNOSIS — N951 Menopausal and female climacteric states: Secondary | ICD-10-CM | POA: Insufficient documentation

## 2023-09-03 DIAGNOSIS — J42 Unspecified chronic bronchitis: Secondary | ICD-10-CM | POA: Diagnosis not present

## 2023-09-03 NOTE — Progress Notes (Signed)
Established Patient Office Visit  Subjective:  Patient ID: Nicole Walter, female    DOB: [birth year]  Age: 63 y.o. MRN: [id number]  Chief Complaint  Patient presents with   Follow-up    Discuss hot flashes    C/o uncontrolled hot flashes over the last few months.    No other concerns at this time.   Past Medical History:  Diagnosis Date   Abnormal liver enzymes 02/08/2016   Allergy    Anxiety    Chest pain 07/05/2016   Chronic pain    Depression    DJD (degenerative joint disease)    lumbar   Fibromyalgia    High cholesterol    Rash of back 01/28/2016   Weight loss due to medication     Past Surgical History:  Procedure Laterality Date   ANKLE SURGERY     x 2   BREAST BIOPSY Left 1997   CHOLECYSTECTOMY     COLONOSCOPY     COLONOSCOPY WITH PROPOFOL N/A 02/22/2016   Procedure: COLONOSCOPY WITH PROPOFOL;  Surgeon: Christena Deem, MD;  Location: [facility] ENDOSCOPY;  Service: Endoscopy;  Laterality: N/A;   ESOPHAGOGASTRODUODENOSCOPY (EGD) WITH PROPOFOL N/A 02/22/2016   Procedure: ESOPHAGOGASTRODUODENOSCOPY (EGD) WITH PROPOFOL;  Surgeon: Christena Deem, MD;  Location: [facility] ENDOSCOPY;  Service: Endoscopy;  Laterality: N/A;   NASAL SINUS SURGERY     SHOULDER ARTHROSCOPY WITH ROTATOR CUFF REPAIR AND SUBACROMIAL DECOMPRESSION Left 09/23/2019   Procedure: SHOULDER ARTHROSCOPY WITH MINI ROTATOR CUFF REPAIR AND SUBACROMIAL DECOMPRESSION, BICEPS TENODESIS;  Surgeon: Signa Kell, MD;  Location: [facility];  Service: Orthopedics;  Laterality: Left;   TONSILLECTOMY     TUBAL LIGATION      Social History   Socioeconomic History   Marital status: Legally Separated    Spouse name: Not on file   Number of children: Not on file   Years of education: Not on file   Highest education level: Not on file  Occupational History   Not on file  Tobacco Use   Smoking status: Every Day    Current packs/day: 0.00    Average packs/day: 1 pack/day for 41.3 years (41.3 ttl pk-yrs)     Types: E-cigarettes, Cigarettes    Start date: 03/06/1974    Last attempt to quit: 07/08/2015    Years since quitting: 8.1   Smokeless tobacco: Never   Tobacco comments:    vapor cigarettes, no nicotene  Vaping Use   Vaping status: Every Day   Start date: 07/08/2015   Substances: Flavoring   Devices: Nort, Geek  Substance and Sexual Activity   Alcohol use: No    Alcohol/week: 0.0 standard drinks of alcohol   Drug use: No   Sexual activity: Not Currently    Birth control/protection: Post-menopausal  Other Topics Concern   Not on file  Social History Narrative   Not on file   Social Determinants of Health   Financial Resource Strain: Not on file  Food Insecurity: Not on file  Transportation Needs: Not on file  Physical Activity: Not on file  Stress: Not on file  Social Connections: Not on file  Intimate Partner Violence: Not on file    Family History  Problem Relation Age of Onset   Heart disease Mother    Stroke Mother    Cancer Father        lung   Arthritis Father    Arthritis Paternal Grandmother    Arthritis Paternal Grandfather    Drug abuse Sister  Drug abuse Brother    Post-traumatic stress disorder Brother    Diabetes Sister    Dementia Sister    Drug abuse Brother    Cancer Brother     Allergies  Allergen Reactions   Other Hives and Other (See Comments)    Pt states she was tested for allergies and peas was one that she is allergic to but has never ate enough of them to see a reaction. Pt states when she is around bird feathers she gets the hives FEATHERS   Adhesive [Tape] Hives   Food     PEAS/ patient does not know what type reaction she has/ from allergy test during childhood   Pea Other (See Comments)    Other reaction(Nicole Walter): Unknown.  Pt states she was tested for allergies and peas was one that she is allergic to but has never ate enough of them to see a reaction.    Review of Systems  Constitutional: Negative.   HENT: Negative.    Eyes:  Negative.   Respiratory:  Positive for wheezing.   Cardiovascular: Negative.   Gastrointestinal: Negative.   Genitourinary: Negative.        Urge incontinence  Musculoskeletal:  Positive for back pain, joint pain and neck pain.       As in hpi  Skin: Negative.   Neurological: Negative.   Endo/Heme/Allergies: Negative.        Objective:   BP 130/75   Pulse 69   Ht 5\' 4"  (1.626 m)   Wt 162 lb 12.8 oz (73.8 kg)   SpO2 97%   BMI 27.94 kg/m   Vitals:   09/03/23 1349  BP: 130/75  Pulse: 69  Height: 5\' 4"  (1.626 m)  Weight: 162 lb 12.8 oz (73.8 kg)  SpO2: 97%  BMI (Calculated): 27.93    Physical Exam Vitals reviewed.  Constitutional:      General: She is not in acute distress. HENT:     Head: Normocephalic.     Nose: Nose normal.     Mouth/Throat:     Mouth: Mucous membranes are moist.  Eyes:     Extraocular Movements: Extraocular movements intact.     Pupils: Pupils are equal, round, and reactive to light.  Cardiovascular:     Rate and Rhythm: Normal rate and regular rhythm.     Heart sounds: No murmur heard. Pulmonary:     Effort: Pulmonary effort is normal.     Breath sounds: No rhonchi or rales.  Abdominal:     General: Abdomen is flat.     Palpations: There is no hepatomegaly, splenomegaly or mass.  Musculoskeletal:        General: Normal range of motion.     Cervical back: Normal range of motion. No tenderness.  Skin:    General: Skin is warm and dry.  Neurological:     General: No focal deficit present.     Mental Status: She is alert and oriented to person, place, and time.     Cranial Nerves: No cranial nerve deficit.     Motor: No weakness.  Psychiatric:        Mood and Affect: Mood normal.        Behavior: Behavior normal.      No results found for any visits on 09/03/23.  No results found for this or any previous visit (from the past 2160 hour(Nicole Walter)).    Assessment & Plan:  As per problem list. Use otc black cohosh and consider  Veozah. Given Airsupra sample for intermittent bronchospasm. Problem List Items Addressed This Visit       Cardiovascular and Mediastinum   Hot flash, menopausal - Primary    No follow-ups on file.   Total time spent: 20 minutes  Luna Fuse, MD  09/03/2023   This document may have been prepared by [facility] Voice Recognition software and as such may include unintentional dictation errors.

## 2023-09-19 ENCOUNTER — Encounter: Payer: Self-pay | Admitting: Internal Medicine

## 2023-09-19 ENCOUNTER — Ambulatory Visit: Payer: Medicaid Other | Admitting: Internal Medicine

## 2023-09-19 DIAGNOSIS — G894 Chronic pain syndrome: Secondary | ICD-10-CM

## 2023-09-19 DIAGNOSIS — M5441 Lumbago with sciatica, right side: Secondary | ICD-10-CM

## 2023-09-19 DIAGNOSIS — G8929 Other chronic pain: Secondary | ICD-10-CM

## 2023-09-19 DIAGNOSIS — M5442 Lumbago with sciatica, left side: Secondary | ICD-10-CM | POA: Diagnosis not present

## 2023-09-19 DIAGNOSIS — Z0130 Encounter for examination of blood pressure without abnormal findings: Secondary | ICD-10-CM

## 2023-09-19 MED ORDER — OXYCODONE HCL 5 MG PO TABS
5.0000 mg | ORAL_TABLET | Freq: Four times a day (QID) | ORAL | 0 refills | Status: DC | PRN
Start: 2023-09-19 — End: 2023-10-19

## 2023-09-19 MED ORDER — TIZANIDINE HCL 4 MG PO TABS
4.0000 mg | ORAL_TABLET | Freq: Two times a day (BID) | ORAL | 3 refills | Status: DC
Start: 2023-09-19 — End: 2024-02-14

## 2023-09-19 NOTE — Progress Notes (Signed)
Established Patient Office Visit  Subjective:  Patient ID: Nicole Walter, female    DOB: 02/10/1961  Age: 62 y.o. MRN: [id number]  Chief Complaint  Patient presents with   Pain Management    PM 1 month    Here for pain management follow up. Chronic pain well controlled on current analgesia. Last UDS satisfactory and pill counts have also been satisfactory.     No other concerns at this time.   Past Medical History:  Diagnosis Date   Abnormal liver enzymes 02/08/2016   Allergy    Anxiety    Chest pain 07/05/2016   Chronic pain    Depression    DJD (degenerative joint disease)    lumbar   Fibromyalgia    High cholesterol    Rash of back 01/28/2016   Weight loss due to medication     Past Surgical History:  Procedure Laterality Date   ANKLE SURGERY     x 2   BREAST BIOPSY Left 1997   CHOLECYSTECTOMY     COLONOSCOPY     COLONOSCOPY WITH PROPOFOL N/A 02/22/2016   Procedure: COLONOSCOPY WITH PROPOFOL;  Surgeon: Christena Deem, MD;  Location: [facility] ENDOSCOPY;  Service: Endoscopy;  Laterality: N/A;   ESOPHAGOGASTRODUODENOSCOPY (EGD) WITH PROPOFOL N/A 02/22/2016   Procedure: ESOPHAGOGASTRODUODENOSCOPY (EGD) WITH PROPOFOL;  Surgeon: Christena Deem, MD;  Location: [facility] ENDOSCOPY;  Service: Endoscopy;  Laterality: N/A;   NASAL SINUS SURGERY     SHOULDER ARTHROSCOPY WITH ROTATOR CUFF REPAIR AND SUBACROMIAL DECOMPRESSION Left 09/23/2019   Procedure: SHOULDER ARTHROSCOPY WITH MINI ROTATOR CUFF REPAIR AND SUBACROMIAL DECOMPRESSION, BICEPS TENODESIS;  Surgeon: Signa Kell, MD;  Location: [facility];  Service: Orthopedics;  Laterality: Left;   TONSILLECTOMY     TUBAL LIGATION      Social History   Socioeconomic History   Marital status: Legally Separated    Spouse name: Not on file   Number of children: Not on file   Years of education: Not on file   Highest education level: Not on file  Occupational History   Not on file  Tobacco Use   Smoking status: Every  Day    Current packs/day: 0.00    Average packs/day: 1 pack/day for 41.3 years (41.3 ttl pk-yrs)    Types: E-cigarettes, Cigarettes    Start date: 03/06/1974    Last attempt to quit: 07/08/2015    Years since quitting: 8.2   Smokeless tobacco: Never   Tobacco comments:    vapor cigarettes, no nicotene  Vaping Use   Vaping status: Every Day   Start date: 07/08/2015   Substances: Flavoring   Devices: [employer]  Substance and Sexual Activity   Alcohol use: No    Alcohol/week: 0.0 standard drinks of alcohol   Drug use: No   Sexual activity: Not Currently    Birth control/protection: Post-menopausal  Other Topics Concern   Not on file  Social History Narrative   Not on file   Social Determinants of Health   Financial Resource Strain: Not on file  Food Insecurity: Not on file  Transportation Needs: Not on file  Physical Activity: Not on file  Stress: Not on file  Social Connections: Not on file  Intimate Partner Violence: Not on file    Family History  Problem Relation Age of Onset   Heart disease Mother    Stroke Mother    Cancer Father        lung   Arthritis Father    Arthritis  Paternal Grandmother    Arthritis Paternal Grandfather    Drug abuse Sister    Drug abuse Brother    Post-traumatic stress disorder Brother    Diabetes Sister    Dementia Sister    Drug abuse Brother    Cancer Brother     Allergies  Allergen Reactions   Other Hives and Other (See Comments)    Pt states she was tested for allergies and peas was one that she is allergic to but has never ate enough of them to see a reaction. Pt states when she is around bird feathers she gets the hives FEATHERS   Adhesive [Tape] Hives   Food     PEAS/ patient does not know what type reaction she has/ from allergy test during childhood   Pea Other (See Comments)    Other reaction([name]): Unknown.  Pt states she was tested for allergies and peas was one that she is allergic to but has never ate enough of them to  see a reaction.    Review of Systems  Constitutional: Negative.   HENT: Negative.    Eyes: Negative.   Respiratory:  Positive for wheezing.   Cardiovascular: Negative.   Gastrointestinal: Negative.   Genitourinary: Negative.        Urge incontinence  Musculoskeletal:  Positive for back pain, joint pain and neck pain.       As in hpi  Skin: Negative.   Neurological: Negative.   Endo/Heme/Allergies: Negative.        Objective:   BP 120/80   Pulse 69   Ht 5\' 4"  (1.626 m)   Wt 159 lb 9.6 oz (72.4 kg)   SpO2 95%   BMI 27.40 kg/m   Vitals:   09/19/23 0926  BP: 120/80  Pulse: 69  Height: 5\' 4"  (1.626 m)  Weight: 159 lb 9.6 oz (72.4 kg)  SpO2: 95%  BMI (Calculated): 27.38    Physical Exam Vitals reviewed.  Constitutional:      General: She is not in acute distress. HENT:     Head: Normocephalic.     Nose: Nose normal.     Mouth/Throat:     Mouth: Mucous membranes are moist.  Eyes:     Extraocular Movements: Extraocular movements intact.     Pupils: Pupils are equal, round, and reactive to light.  Cardiovascular:     Rate and Rhythm: Normal rate and regular rhythm.     Heart sounds: No murmur heard. Pulmonary:     Effort: Pulmonary effort is normal.     Breath sounds: No rhonchi or rales.  Abdominal:     General: Abdomen is flat.     Palpations: There is no hepatomegaly, splenomegaly or mass.  Musculoskeletal:        General: Normal range of motion.     Cervical back: Normal range of motion. No tenderness.  Skin:    General: Skin is warm and dry.  Neurological:     General: No focal deficit present.     Mental Status: She is alert and oriented to person, place, and time.     Cranial Nerves: No cranial nerve deficit.     Motor: No weakness.  Psychiatric:        Mood and Affect: Mood normal.        Behavior: Behavior normal.      No results found for any visits on 09/19/23.  No results found for this or any previous visit (from the past 2160  hour(Amol Domanski)).  Assessment & Plan:  As per problem list.  Problem List Items Addressed This Visit       Nervous and Auditory   Chronic low back pain (Bilateral) (L>R) (Chronic)   Relevant Medications   oxyCODONE (OXY IR/ROXICODONE) 5 MG immediate release tablet   tiZANidine (ZANAFLEX) 4 MG tablet     Other   Chronic pain syndrome (Chronic)   Relevant Medications   oxyCODONE (OXY IR/ROXICODONE) 5 MG immediate release tablet   tiZANidine (ZANAFLEX) 4 MG tablet    Return in about 1 month (around 10/19/2023) for Pain Management.   Total time spent: 20 minutes  Luna Fuse, MD  09/19/2023   This document may have been prepared by [facility] Voice Recognition software and as such may include unintentional dictation errors.

## 2023-09-25 ENCOUNTER — Encounter: Payer: Self-pay | Admitting: Student in an Organized Health Care Education/Training Program

## 2023-09-25 ENCOUNTER — Ambulatory Visit
Payer: Medicaid Other | Attending: Student in an Organized Health Care Education/Training Program | Admitting: Student in an Organized Health Care Education/Training Program

## 2023-09-25 VITALS — BP 141/62 | HR 72 | Temp 97.2°F | Resp 18 | Ht 64.0 in | Wt 163.0 lb

## 2023-09-25 DIAGNOSIS — M5412 Radiculopathy, cervical region: Secondary | ICD-10-CM | POA: Diagnosis not present

## 2023-09-25 DIAGNOSIS — M5416 Radiculopathy, lumbar region: Secondary | ICD-10-CM | POA: Diagnosis present

## 2023-09-25 DIAGNOSIS — G894 Chronic pain syndrome: Secondary | ICD-10-CM | POA: Diagnosis present

## 2023-09-25 DIAGNOSIS — G8929 Other chronic pain: Secondary | ICD-10-CM | POA: Insufficient documentation

## 2023-09-25 DIAGNOSIS — M5414 Radiculopathy, thoracic region: Secondary | ICD-10-CM | POA: Insufficient documentation

## 2023-09-25 NOTE — Progress Notes (Signed)
PROVIDER NOTE: Information contained herein reflects review and annotations entered in association with encounter. Interpretation of such information and data should be left to medically-trained personnel. Information provided to patient can be located elsewhere in the medical record under "Patient Instructions". Document created using STT-dictation technology, any transcriptional errors that may result from process are unintentional.    Patient: Nicole Walter  Service Category: E/M  Provider: Edward Jolly, MD  DOB: Oct 07, 1961  DOS: 09/25/2023  Referring Provider: Sherron Monday, MD  MRN: [id number]  Specialty: Interventional Pain Management  PCP: Sherron Monday, MD  Type: Established Patient  Setting: Ambulatory outpatient    Location: Office  Delivery: Face-to-face     HPI  Nicole Walter, a 62 y.o. year old female, is here today because of her Cervical radicular pain [M54.12]. Ms. Holtan primary complain today is Neck Pain, Back Pain, and Hip Pain  Pertinent problems: Nicole Walter does not have any pertinent problems on file. Pain Assessment: Severity of Chronic pain is reported as a 4 /10. Location: Hip Right, Left/Radaites from hips and wraps around to back. Onset: More than a month ago. Quality: Constant, Tightness, Squeezing, Aching, Stabbing. Timing: Constant. Modifying factor(s): Oxycodone and muscle relaxer. Vitals:  height is 5\' 4"  (1.626 m) and weight is 163 lb (73.9 kg). Her temporal temperature is 97.2 F (36.2 C) (abnormal). Her blood pressure is 141/62 (abnormal) and her pulse is 72. Her respiration is 18 and oxygen saturation is 99%.  BMI: Estimated body mass index is 27.98 kg/m as calculated from the following:   Height as of this encounter: 5\' 4"  (1.626 m).   Weight as of this encounter: 163 lb (73.9 kg). Last encounter: 03/20/2023. Last procedure: Visit date not found.  Reason for encounter: follow-up evaluation to review MRI results  The patient presents with  persistent pain primarily located in the right hip and lower lumbar region. She reports no radiation of this pain down the leg. She also mentions recent trouble with her neck. The patient describes the lumbar and hip pain as severe, likening it to something prying at the hip bone. She also reports a sensation of pressure and tightening in the lower back, which she differentiates from the pain.  The patient has a history of multiple epidural injections, the most recent of which was in 2021. She found these injections helpful in managing her pain. She also reports having a bursitis shot in the right shoulder in 2021.  In addition to the lumbar and hip pain, the patient has been experiencing discomfort in her neck. She reports difficulty sleeping due to this pain, which affects both sides. She has a history of surgery in the right shoulder and a tear in the left shoulder. She has tried multiple medications for pain relief without success.  The patient also has a history of radiofrequency treatments for her neck, which she reports were not effective. She mentions a thoracic protrusion, which when spasms occur, causes her spine to curve. She had nerve blocks done with ablation around 2017, which she reports were helpful for a long time.  The patient also mentions having osteopenia and expresses anxiety about upcoming procedures. She prefers to be sedated for these procedures due to her high anxiety levels.    ROS  Constitutional: Denies any fever or chills Gastrointestinal: No reported hemesis, hematochezia, vomiting, or acute GI distress Musculoskeletal:  as above Neurological: No reported episodes of acute onset apraxia, aphasia, dysarthria, agnosia, amnesia, paralysis, loss of coordination, or loss of  consciousness  Medication Review  Azelastine HCl, Calcium Carb-Cholecalciferol, Multi-Vitamins, Vitamin D3, cetirizine, fexofenadine, linaclotide, oxyCODONE, and tiZANidine  History Review  Allergy:  Nicole Walter is allergic to other, adhesive [tape], food, and pea. Drug: Nicole Walter  reports no history of drug use. Alcohol:  reports no history of alcohol use. Tobacco:  reports that she has been smoking e-cigarettes and cigarettes. She started smoking about 49 years ago. She has a 41.3 pack-year smoking history. She has never used smokeless tobacco. Social: Ms. Donnally  reports that she has been smoking e-cigarettes and cigarettes. She started smoking about 49 years ago. She has a 41.3 pack-year smoking history. She has never used smokeless tobacco. She reports that she does not drink alcohol and does not use drugs. Medical:  has a past medical history of Abnormal liver enzymes (02/08/2016), Allergy, Anxiety, Chest pain (07/05/2016), Chronic pain, Depression, DJD (degenerative joint disease), Fibromyalgia, High cholesterol, Rash of back (01/28/2016), and Weight loss due to medication. Surgical: Nicole Walter  has a past surgical history that includes Cholecystectomy; Tubal ligation; Ankle surgery; Nasal sinus surgery; Tonsillectomy; Colonoscopy; Esophagogastroduodenoscopy (egd) with propofol (N/A, 02/22/2016); Colonoscopy with propofol (N/A, 02/22/2016); Breast biopsy (Left, 1997); and Shoulder arthroscopy with rotator cuff repair and subacromial decompression (Left, 09/23/2019). Family: family history includes Arthritis in her father, paternal grandfather, and paternal grandmother; Cancer in her brother and father; Dementia in her sister; Diabetes in her sister; Drug abuse in her brother, brother, and sister; Heart disease in her mother; Post-traumatic stress disorder in her brother; Stroke in her mother.  Laboratory Chemistry Profile   Renal Lab Results  Component Value Date   BUN 12 05/11/2023   CREATININE 0.72 05/11/2023   BCR 22 12/25/2022   GFR 86.13 10/08/2018   GFRAA >60 12/07/2018   GFRNONAA >60 05/11/2023    Hepatic Lab Results  Component Value Date   AST 17 12/25/2022   ALT 24 12/25/2022    ALBUMIN 4.7 12/25/2022   ALKPHOS 83 12/25/2022   HCVAB NEGATIVE 02/14/2017   LIPASE 29 12/07/2018    Electrolytes Lab Results  Component Value Date   NA 137 05/11/2023   K 3.9 05/11/2023   CL 104 05/11/2023   CALCIUM 9.5 05/11/2023   MG 2.2 05/15/2017    Bone Lab Results  Component Value Date   VD25OH 51.75 10/11/2018   25OHVITD1 58 07/20/2016   25OHVITD2 <1.0 07/20/2016   25OHVITD3 58 07/20/2016    Inflammation (CRP: Acute Phase) (ESR: Chronic Phase) Lab Results  Component Value Date   CRP 2.1 (H) 07/20/2016   ESRSEDRATE 2 05/15/2017         Note: Above Lab results reviewed.  Recent Imaging Review  MR THORACIC SPINE WO CONTRAST CLINICAL DATA:  Back  EXAM: MRI CERVICAL, THORACIC AND LUMBAR SPINE WITHOUT CONTRAST  TECHNIQUE: Multiplanar and multiecho pulse sequences of the cervical spine, to include the craniocervical junction and cervicothoracic junction, and thoracic and lumbar spine, were obtained without intravenous contrast.  COMPARISON:  09/04/2017 MRI cervical spine, 07/11/2016 MRI thoracic spine and 03/02/2016 MRI lumbar spine  FINDINGS: MRI CERVICAL SPINE FINDINGS  Alignment: Straightening and mild reversal of the normal cervical lordosis. Trace anterolisthesis of C2 on C3, C3 on C4, and C4 on C5, which is new from the prior exam and favored to be facet mediated.  Vertebrae: No acute fracture, evidence of discitis, or suspicious osseous lesion.  Cord: Normal signal and morphology.  Posterior Fossa, vertebral arteries, paraspinal tissues: Negative.  Disc levels:  C2-C3: No significant disc  bulge. Left facet arthropathy. No spinal canal stenosis or neural foraminal narrowing.  C3-C4: Previously noted right paracentral disc protrusion is no longer seen. No significant disc bulge. Left-greater-than-right facet arthropathy. No spinal canal stenosis or neural foraminal narrowing.  C4-C5: Small central disc protrusion. Facet arthropathy. No  spinal canal stenosis. Mild bilateral neural foraminal narrowing.  C5-C6: Mild disc bulge. Ligamentum flavum hypertrophy. Mild facet arthropathy. Mild-to-moderate spinal canal stenosis, which has progressed slightly from the prior exam. Moderate left and mild-to-moderate right neural foraminal narrowing, unchanged.  C6-C7: Mild disc bulge with increased size left paracentral disc extrusion, which indents the left aspect of the thecal sac and ventral spinal cord. Ligamentum flavum hypertrophy. Facet arthropathy. Moderate spinal canal stenosis, which has progressed from the prior exam, with unchanged mild bilateral neural foraminal narrowing.  C7-T1: No significant disc bulge. Left-greater-than-right facet arthropathy. No spinal canal stenosis. Mild left neural foraminal narrowing.  MRI THORACIC SPINE FINDINGS  Alignment: No listhesis. In mild levocurvature of the upper thoracic spine with dextrocurvature of the lower thoracic spine and thoracolumbar junction. Straightening of the normal thoracic kyphosis.  Vertebrae: No acute fracture, evidence of discitis, or suspicious osseous lesion.  Cord:  Normal signal and morphology.  Paraspinal and other soft tissues: Negative.  Disc levels:  T1-T2: Facet arthropathy. No spinal canal stenosis. Moderate to severe right and moderate left neural foraminal narrowing, unchanged.  T2-T3: Right eccentric disc bulge. Right greater than left facet arthropathy. No spinal canal stenosis. Severe right neural foraminal narrowing, unchanged.  T3-T4: Right eccentric disc bulge. Right greater than facet arthropathy. No spinal canal stenosis. Moderate right neural foraminal narrowing.  T4-T5: Facet arthropathy. No spinal canal stenosis or neural foraminal narrowing.  T5-T6: Left paracentral disc protrusion. No spinal canal stenosis or neural foraminal narrowing.  T6-T7: Small central disc protrusion. No spinal canal stenosis or neural  foraminal narrowing.  T7-T8: Small right subarticular disc protrusion. No spinal canal stenosis or neural foraminal narrowing.  T8-T9: Facet arthropathy. No spinal canal stenosis or neural foraminal narrowing.  T9-T10: Moderate facet arthropathy. No spinal canal stenosis. Mild bilateral neural foraminal narrowing.  T10-T11: Moderate to severe facet arthropathy. No spinal canal stenosis. Mild bilateral neural foraminal narrowing.  T11-T12: Moderate facet arthropathy. No spinal canal stenosis. Mild left neural foraminal narrowing.  MRI LUMBAR SPINE FINDINGS  Segmentation:  5 lumbar type vertebral bodies.  Alignment: 5 mm anterolisthesis L4 on L5, which is new from the prior exam. Mild levocurvature.  Vertebrae: No acute fracture, evidence of discitis, or suspicious osseous lesion.  Conus medullaris and cauda equina: Conus extends to the L1 level. Conus and cauda equina appear normal.  Paraspinal and other soft tissues: Small renal cysts, for which no follow-up is currently indicated. No lymphadenopathy.  Disc levels:  T12-L1: No significant disc bulge. No spinal canal stenosis or neural foraminal narrowing.  L1-L2: No significant disc bulge. No spinal canal stenosis or neural foraminal narrowing.  L2-L3: Minimal disc bulge. No spinal canal stenosis or neural foraminal narrowing.  L3-L4: Mild disc bulge. Mild facet arthropathy. Narrowing of the lateral recesses. Mild spinal canal stenosis, which is new from the prior exam. Mild left-greater-than-right neural foraminal narrowing, similar to the prior.  L4-L5: Grade 1 anterolisthesis with disc unroofing and moderate disc bulge. Severe facet arthropathy. Prominent epidural fat. Effacement of the lateral recesses and moderate to severe spinal canal stenosis, which are new from the prior exam. Mild bilateral neural foraminal narrowing, which has progressed on the left.  L5-S1: No significant disc bulge. No  spinal canal  stenosis or neural foraminal narrowing.  IMPRESSION: CERVICAL SPINE:  1. C6-C7 moderate spinal canal stenosis, which has progressed from the prior exam, with unchanged mild bilateral neural foraminal narrowing. 2. C5-C6 mild-to-moderate spinal canal stenosis, which has progressed slightly from the prior exam, with unchanged moderate left and mild-to-moderate right neural foraminal narrowing. 3. C4-C5 mild bilateral neural foraminal narrowing.  THORACIC SPINE:  Overall unchanged degenerative findings in the thoracic spine, without significant spinal canal stenosis.  LUMBAR SPINE:  1. L4-L5 moderate to severe spinal canal stenosis, which is new from the prior exam, with effacement of the lateral recesses, likely compressing the descending L5 nerve roots, and mild bilateral neural foraminal narrowing, which has progressed on the left. 2. L3-L4 mild spinal canal stenosis, which is new from the prior exam, with mild left-greater-than-right neural foraminal narrowing.  Electronically Signed   By: Wiliam Ke M.D.   On: 09/20/2023 20:41 MR CERVICAL SPINE WO CONTRAST CLINICAL DATA:  Back  EXAM: MRI CERVICAL, THORACIC AND LUMBAR SPINE WITHOUT CONTRAST  TECHNIQUE: Multiplanar and multiecho pulse sequences of the cervical spine, to include the craniocervical junction and cervicothoracic junction, and thoracic and lumbar spine, were obtained without intravenous contrast.  COMPARISON:  09/04/2017 MRI cervical spine, 07/11/2016 MRI thoracic spine and 03/02/2016 MRI lumbar spine  FINDINGS: MRI CERVICAL SPINE FINDINGS  Alignment: Straightening and mild reversal of the normal cervical lordosis. Trace anterolisthesis of C2 on C3, C3 on C4, and C4 on C5, which is new from the prior exam and favored to be facet mediated.  Vertebrae: No acute fracture, evidence of discitis, or suspicious osseous lesion.  Cord: Normal signal and morphology.  Posterior Fossa, vertebral arteries,  paraspinal tissues: Negative.  Disc levels:  C2-C3: No significant disc bulge. Left facet arthropathy. No spinal canal stenosis or neural foraminal narrowing.  C3-C4: Previously noted right paracentral disc protrusion is no longer seen. No significant disc bulge. Left-greater-than-right facet arthropathy. No spinal canal stenosis or neural foraminal narrowing.  C4-C5: Small central disc protrusion. Facet arthropathy. No spinal canal stenosis. Mild bilateral neural foraminal narrowing.  C5-C6: Mild disc bulge. Ligamentum flavum hypertrophy. Mild facet arthropathy. Mild-to-moderate spinal canal stenosis, which has progressed slightly from the prior exam. Moderate left and mild-to-moderate right neural foraminal narrowing, unchanged.  C6-C7: Mild disc bulge with increased size left paracentral disc extrusion, which indents the left aspect of the thecal sac and ventral spinal cord. Ligamentum flavum hypertrophy. Facet arthropathy. Moderate spinal canal stenosis, which has progressed from the prior exam, with unchanged mild bilateral neural foraminal narrowing.  C7-T1: No significant disc bulge. Left-greater-than-right facet arthropathy. No spinal canal stenosis. Mild left neural foraminal narrowing.  MRI THORACIC SPINE FINDINGS  Alignment: No listhesis. In mild levocurvature of the upper thoracic spine with dextrocurvature of the lower thoracic spine and thoracolumbar junction. Straightening of the normal thoracic kyphosis.  Vertebrae: No acute fracture, evidence of discitis, or suspicious osseous lesion.  Cord:  Normal signal and morphology.  Paraspinal and other soft tissues: Negative.  Disc levels:  T1-T2: Facet arthropathy. No spinal canal stenosis. Moderate to severe right and moderate left neural foraminal narrowing, unchanged.  T2-T3: Right eccentric disc bulge. Right greater than left facet arthropathy. No spinal canal stenosis. Severe right neural  foraminal narrowing, unchanged.  T3-T4: Right eccentric disc bulge. Right greater than facet arthropathy. No spinal canal stenosis. Moderate right neural foraminal narrowing.  T4-T5: Facet arthropathy. No spinal canal stenosis or neural foraminal narrowing.  T5-T6: Left paracentral disc protrusion. No spinal canal  stenosis or neural foraminal narrowing.  T6-T7: Small central disc protrusion. No spinal canal stenosis or neural foraminal narrowing.  T7-T8: Small right subarticular disc protrusion. No spinal canal stenosis or neural foraminal narrowing.  T8-T9: Facet arthropathy. No spinal canal stenosis or neural foraminal narrowing.  T9-T10: Moderate facet arthropathy. No spinal canal stenosis. Mild bilateral neural foraminal narrowing.  T10-T11: Moderate to severe facet arthropathy. No spinal canal stenosis. Mild bilateral neural foraminal narrowing.  T11-T12: Moderate facet arthropathy. No spinal canal stenosis. Mild left neural foraminal narrowing.  MRI LUMBAR SPINE FINDINGS  Segmentation:  5 lumbar type vertebral bodies.  Alignment: 5 mm anterolisthesis L4 on L5, which is new from the prior exam. Mild levocurvature.  Vertebrae: No acute fracture, evidence of discitis, or suspicious osseous lesion.  Conus medullaris and cauda equina: Conus extends to the L1 level. Conus and cauda equina appear normal.  Paraspinal and other soft tissues: Small renal cysts, for which no follow-up is currently indicated. No lymphadenopathy.  Disc levels:  T12-L1: No significant disc bulge. No spinal canal stenosis or neural foraminal narrowing.  L1-L2: No significant disc bulge. No spinal canal stenosis or neural foraminal narrowing.  L2-L3: Minimal disc bulge. No spinal canal stenosis or neural foraminal narrowing.  L3-L4: Mild disc bulge. Mild facet arthropathy. Narrowing of the lateral recesses. Mild spinal canal stenosis, which is new from the prior exam. Mild  left-greater-than-right neural foraminal narrowing, similar to the prior.  L4-L5: Grade 1 anterolisthesis with disc unroofing and moderate disc bulge. Severe facet arthropathy. Prominent epidural fat. Effacement of the lateral recesses and moderate to severe spinal canal stenosis, which are new from the prior exam. Mild bilateral neural foraminal narrowing, which has progressed on the left.  L5-S1: No significant disc bulge. No spinal canal stenosis or neural foraminal narrowing.  IMPRESSION: CERVICAL SPINE:  1. C6-C7 moderate spinal canal stenosis, which has progressed from the prior exam, with unchanged mild bilateral neural foraminal narrowing. 2. C5-C6 mild-to-moderate spinal canal stenosis, which has progressed slightly from the prior exam, with unchanged moderate left and mild-to-moderate right neural foraminal narrowing. 3. C4-C5 mild bilateral neural foraminal narrowing.  THORACIC SPINE:  Overall unchanged degenerative findings in the thoracic spine, without significant spinal canal stenosis.  LUMBAR SPINE:  1. L4-L5 moderate to severe spinal canal stenosis, which is new from the prior exam, with effacement of the lateral recesses, likely compressing the descending L5 nerve roots, and mild bilateral neural foraminal narrowing, which has progressed on the left. 2. L3-L4 mild spinal canal stenosis, which is new from the prior exam, with mild left-greater-than-right neural foraminal narrowing.  Electronically Signed   By: Wiliam Ke M.D.   On: 09/20/2023 20:41 MR LUMBAR SPINE WO CONTRAST CLINICAL DATA:  Back  EXAM: MRI CERVICAL, THORACIC AND LUMBAR SPINE WITHOUT CONTRAST  TECHNIQUE: Multiplanar and multiecho pulse sequences of the cervical spine, to include the craniocervical junction and cervicothoracic junction, and thoracic and lumbar spine, were obtained without intravenous contrast.  COMPARISON:  09/04/2017 MRI cervical spine, 07/11/2016 MRI  thoracic spine and 03/02/2016 MRI lumbar spine  FINDINGS: MRI CERVICAL SPINE FINDINGS  Alignment: Straightening and mild reversal of the normal cervical lordosis. Trace anterolisthesis of C2 on C3, C3 on C4, and C4 on C5, which is new from the prior exam and favored to be facet mediated.  Vertebrae: No acute fracture, evidence of discitis, or suspicious osseous lesion.  Cord: Normal signal and morphology.  Posterior Fossa, vertebral arteries, paraspinal tissues: Negative.  Disc levels:  C2-C3: No significant  disc bulge. Left facet arthropathy. No spinal canal stenosis or neural foraminal narrowing.  C3-C4: Previously noted right paracentral disc protrusion is no longer seen. No significant disc bulge. Left-greater-than-right facet arthropathy. No spinal canal stenosis or neural foraminal narrowing.  C4-C5: Small central disc protrusion. Facet arthropathy. No spinal canal stenosis. Mild bilateral neural foraminal narrowing.  C5-C6: Mild disc bulge. Ligamentum flavum hypertrophy. Mild facet arthropathy. Mild-to-moderate spinal canal stenosis, which has progressed slightly from the prior exam. Moderate left and mild-to-moderate right neural foraminal narrowing, unchanged.  C6-C7: Mild disc bulge with increased size left paracentral disc extrusion, which indents the left aspect of the thecal sac and ventral spinal cord. Ligamentum flavum hypertrophy. Facet arthropathy. Moderate spinal canal stenosis, which has progressed from the prior exam, with unchanged mild bilateral neural foraminal narrowing.  C7-T1: No significant disc bulge. Left-greater-than-right facet arthropathy. No spinal canal stenosis. Mild left neural foraminal narrowing.  MRI THORACIC SPINE FINDINGS  Alignment: No listhesis. In mild levocurvature of the upper thoracic spine with dextrocurvature of the lower thoracic spine and thoracolumbar junction. Straightening of the normal  thoracic kyphosis.  Vertebrae: No acute fracture, evidence of discitis, or suspicious osseous lesion.  Cord:  Normal signal and morphology.  Paraspinal and other soft tissues: Negative.  Disc levels:  T1-T2: Facet arthropathy. No spinal canal stenosis. Moderate to severe right and moderate left neural foraminal narrowing, unchanged.  T2-T3: Right eccentric disc bulge. Right greater than left facet arthropathy. No spinal canal stenosis. Severe right neural foraminal narrowing, unchanged.  T3-T4: Right eccentric disc bulge. Right greater than facet arthropathy. No spinal canal stenosis. Moderate right neural foraminal narrowing.  T4-T5: Facet arthropathy. No spinal canal stenosis or neural foraminal narrowing.  T5-T6: Left paracentral disc protrusion. No spinal canal stenosis or neural foraminal narrowing.  T6-T7: Small central disc protrusion. No spinal canal stenosis or neural foraminal narrowing.  T7-T8: Small right subarticular disc protrusion. No spinal canal stenosis or neural foraminal narrowing.  T8-T9: Facet arthropathy. No spinal canal stenosis or neural foraminal narrowing.  T9-T10: Moderate facet arthropathy. No spinal canal stenosis. Mild bilateral neural foraminal narrowing.  T10-T11: Moderate to severe facet arthropathy. No spinal canal stenosis. Mild bilateral neural foraminal narrowing.  T11-T12: Moderate facet arthropathy. No spinal canal stenosis. Mild left neural foraminal narrowing.  MRI LUMBAR SPINE FINDINGS  Segmentation:  5 lumbar type vertebral bodies.  Alignment: 5 mm anterolisthesis L4 on L5, which is new from the prior exam. Mild levocurvature.  Vertebrae: No acute fracture, evidence of discitis, or suspicious osseous lesion.  Conus medullaris and cauda equina: Conus extends to the L1 level. Conus and cauda equina appear normal.  Paraspinal and other soft tissues: Small renal cysts, for which no follow-up is currently indicated.  No lymphadenopathy.  Disc levels:  T12-L1: No significant disc bulge. No spinal canal stenosis or neural foraminal narrowing.  L1-L2: No significant disc bulge. No spinal canal stenosis or neural foraminal narrowing.  L2-L3: Minimal disc bulge. No spinal canal stenosis or neural foraminal narrowing.  L3-L4: Mild disc bulge. Mild facet arthropathy. Narrowing of the lateral recesses. Mild spinal canal stenosis, which is new from the prior exam. Mild left-greater-than-right neural foraminal narrowing, similar to the prior.  L4-L5: Grade 1 anterolisthesis with disc unroofing and moderate disc bulge. Severe facet arthropathy. Prominent epidural fat. Effacement of the lateral recesses and moderate to severe spinal canal stenosis, which are new from the prior exam. Mild bilateral neural foraminal narrowing, which has progressed on the left.  L5-S1: No significant disc bulge.  No spinal canal stenosis or neural foraminal narrowing.  IMPRESSION: CERVICAL SPINE:  1. C6-C7 moderate spinal canal stenosis, which has progressed from the prior exam, with unchanged mild bilateral neural foraminal narrowing. 2. C5-C6 mild-to-moderate spinal canal stenosis, which has progressed slightly from the prior exam, with unchanged moderate left and mild-to-moderate right neural foraminal narrowing. 3. C4-C5 mild bilateral neural foraminal narrowing.  THORACIC SPINE:  Overall unchanged degenerative findings in the thoracic spine, without significant spinal canal stenosis.  LUMBAR SPINE:  1. L4-L5 moderate to severe spinal canal stenosis, which is new from the prior exam, with effacement of the lateral recesses, likely compressing the descending L5 nerve roots, and mild bilateral neural foraminal narrowing, which has progressed on the left. 2. L3-L4 mild spinal canal stenosis, which is new from the prior exam, with mild left-greater-than-right neural foraminal narrowing.  Electronically  Signed   By: Wiliam Ke M.D.   On: 09/20/2023 20:41    Note: Reviewed        Physical Exam  General appearance: Well nourished, well developed, and well hydrated. In no apparent acute distress Mental status: Alert, oriented x 3 (person, place, & time)       Respiratory: No evidence of acute respiratory distress Eyes: PERLA Vitals: BP (!) 141/62   Pulse 72   Temp (!) 97.2 F (36.2 C) (Temporal)   Resp 18   Ht 5\' 4"  (1.626 m)   Wt 163 lb (73.9 kg)   SpO2 99%   BMI 27.98 kg/m  BMI: Estimated body mass index is 27.98 kg/m as calculated from the following:   Height as of this encounter: 5\' 4"  (1.626 m).   Weight as of this encounter: 163 lb (73.9 kg). Ideal: Ideal body weight: 54.7 kg (120 lb 9.5 oz) Adjusted ideal body weight: 62.4 kg (137 lb 8.9 oz)  Cervical Spine Area Exam  Skin & Axial Inspection: No masses, redness, edema, swelling, or associated skin lesions Alignment: Symmetrical Functional ROM: Pain restricted ROM, bilaterally Stability: No instability detected Muscle Tone/Strength: Functionally intact. No obvious neuro-muscular anomalies detected. Sensory (Neurological): Dermatomal pain pattern Palpation: No palpable anomalies             Upper Extremity (UE) Exam      Side: Right upper extremity   Side: Left upper extremity  Skin & Extremity Inspection: Skin color, temperature, and hair growth are WNL. No peripheral edema or cyanosis. No masses, redness, swelling, asymmetry, or associated skin lesions. No contractures.   Skin & Extremity Inspection: Skin color, temperature, and hair growth are WNL. No peripheral edema or cyanosis. No masses, redness, swelling, asymmetry, or associated skin lesions. No contractures.  Functional ROM: Pain restricted ROM for all joints of upper extremity   Functional ROM: Pain restricted ROM for all joints of upper extremity  Muscle Tone/Strength: Functionally intact. No obvious neuro-muscular anomalies detected.   Muscle  Tone/Strength: Functionally intact. No obvious neuro-muscular anomalies detected.  Sensory (Neurological): Dermatomal pain pattern           Sensory (Neurological): Dermatomal pain pattern          Palpation: No palpable anomalies               Palpation: No palpable anomalies              Provocative Test(s):  Phalen's test: deferred Tinel's test: deferred Apley's scratch test (touch opposite shoulder):  Action 1 (Across chest): Decreased ROM Action 2 (Overhead): Decreased ROM Action 3 (LB reach): Decreased ROM  Provocative Test(s):  Phalen's test: deferred Tinel's test: deferred Apley's scratch test (touch opposite shoulder):  Action 1 (Across chest): Decreased ROM Action 2 (Overhead): Decreased ROM Action 3 (LB reach): Decreased ROM      Thoracic Spine Area Exam  Skin & Axial Inspection: No masses, redness, or swelling Alignment: Symmetrical Functional ROM: Pain restricted ROM Stability: No instability detected Muscle Tone/Strength: Functionally intact. No obvious neuro-muscular anomalies detected. Sensory (Neurological): Neurogenic pain pattern Muscle strength & Tone: No palpable anomalies Lumbar Spine Area Exam  Skin & Axial Inspection: No masses, redness, or swelling Alignment: Symmetrical Functional ROM: Pain restricted ROM affecting both sides Stability: No instability detected Muscle Tone/Strength: Functionally intact. No obvious neuro-muscular anomalies detected. Sensory (Neurological): Dermatomal pain pattern Palpation: No palpable anomalies       Provocative Tests: Hyperextension/rotation test: (+) bilaterally for facet joint pain. Lumbar quadrant test (Kemp's test): (+) bilateral for foraminal stenosis Lateral bending test: (+) due to pain.   Gait & Posture Assessment  Ambulation: Unassisted Gait: Relatively normal for age and body habitus Posture: WNL  Lower Extremity Exam      Side: Right lower extremity   Side: Left lower extremity  Stability: No  instability observed           Stability: No instability observed          Skin & Extremity Inspection: Skin color, temperature, and hair growth are WNL. No peripheral edema or cyanosis. No masses, redness, swelling, asymmetry, or associated skin lesions. No contractures.   Skin & Extremity Inspection: Skin color, temperature, and hair growth are WNL. No peripheral edema or cyanosis. No masses, redness, swelling, asymmetry, or associated skin lesions. No contractures.  Functional ROM: Unrestricted ROM                   Functional ROM: Unrestricted ROM                  Muscle Tone/Strength: Functionally intact. No obvious neuro-muscular anomalies detected.   Muscle Tone/Strength: Functionally intact. No obvious neuro-muscular anomalies detected.  Sensory (Neurological): Unimpaired         Sensory (Neurological): Unimpaired        DTR: Patellar: deferred today Achilles: deferred today Plantar: deferred today   DTR: Patellar: deferred today Achilles: deferred today Plantar: deferred today  Palpation: No palpable anomalies   Palpation: No palpable anomalies     Assessment   Diagnosis Status  1. Cervical radicular pain   2. Radicular pain of thoracic region   3. Chronic radicular lumbar pain   4. Chronic pain syndrome    Having a Flare-up Controlled Having a Flare-up   Updated Problems: No problems updated.  Plan of Care  Lumbar Spinal Stenosis with Severe Arthritis   Reviewed MRI with patient in detail . severe arthritis at L4-L5 and severe canal stenosis cause chronic low back and right hip pain, including symptoms of pain, heaviness in the buttock region, and potential right leg weakness in the patient. She experienced some relief from epidural injections in 2021. After discussing the risks of infection, bleeding, and nerve damage, and the benefits of pain relief and improved mobility, she opted for an epidural injection with IV sedation due to high anxiety. We will perform the lumbar  epidural injection with IV sedation and schedule the procedure within 1-2 weeks.  Cervical Spinal Stenosis   Reviewed MRI with patient in detail. the patient suffers from chronic neck pain with tightness and stenosis at C4-C5, C5-C6, and C6-C7, affecting  both shoulders and disrupting sleep. Previous nerve blocks and radiofrequency ablation offered limited success. We discussed the risks and benefits of a cervical epidural injection, similar to the lumbar procedure. Given her high anxiety, she prefers IV sedation. We will perform the cervical epidural injection with IV sedation and aim to schedule it within 1-2 weeks.  Right Shoulder Bursitis   The patient's right shoulder bursitis, treated with an injection in 2021, currently causes pain and difficulty sleeping on both shoulders. We plan to monitor symptoms post-epidural injections and consider further evaluation if symptoms persist. No orders of the defined types were placed in this encounter.  Orders:  Orders Placed This Encounter  Procedures   Cervical Epidural Injection    Sedation: Patient's choice. Purpose: Diagnostic/Therapeutic Indication(s): Radiculitis and cervicalgia associater with cervical degenerative disc disease.    Standing Status:   Future    Standing Expiration Date:   12/26/2023    Scheduling Instructions:     Procedure: Cervical Epidural Steroid Injection/Block     Level(s): C7-T1     Laterality: TBD     Timeframe: As soon as schedule allows    Order Specific Question:   Where will this procedure be performed?    Answer:   [facility]    Comments:   Odeth Bry   Lumbar Transforaminal Epidural    Standing Status:   Future    Standing Expiration Date:   12/26/2023    Scheduling Instructions:     Laterality: Bilateral     Level(s): L5          Sedation: IV Versed     Timeframe: ASAP    Order Specific Question:   Where will this procedure be performed?    Answer:   [facility]   Follow-up plan:    Return in about 15 days (around 10/10/2023) for C-ESI and B/L L5 TF ESI, in clinic IV Versed.      Recent Visits No visits were found meeting these conditions. Showing recent visits within past 90 days and meeting all other requirements Today's Visits Date Type Provider Dept  09/25/23 Office Visit Edward Jolly, MD [facility]  Showing today's visits and meeting all other requirements Future Appointments No visits were found meeting these conditions. Showing future appointments within next 90 days and meeting all other requirements  I discussed the assessment and treatment plan with the patient. The patient was provided an opportunity to ask questions and all were answered. The patient agreed with the plan and demonstrated an understanding of the instructions.  Patient advised to call back or seek an in-person evaluation if the symptoms or condition worsens.  Duration of encounter: .  Total time on encounter, as per AMA guidelines included both the face-to-face and non-face-to-face time personally spent by the physician and/or other qualified health care professional(s) on the day of the encounter (includes time in activities that require the physician or other qualified health care professional and does not include time in activities normally performed by clinical staff). Physician's time may include the following activities when performed: Preparing to see the patient (e.g., pre-charting review of records, searching for previously ordered imaging, lab work, and nerve conduction tests) Review of prior analgesic pharmacotherapies. Reviewing PMP Interpreting ordered tests (e.g., lab work, imaging, nerve conduction tests) Performing post-procedure evaluations, including interpretation of diagnostic procedures Obtaining and/or reviewing separately obtained history Performing a medically appropriate examination and/or evaluation Counseling and educating the  patient/family/caregiver Ordering medications, tests, or procedures Referring and communicating with  other health care professionals (when not separately reported) Documenting clinical information in the electronic or other health record Independently interpreting results (not separately reported) and communicating results to the patient/ family/caregiver Care coordination (not separately reported)  Note by: Edward Jolly, MD Date: 09/25/2023; Time: 2:30 PM

## 2023-09-25 NOTE — Progress Notes (Signed)
Safety precautions to be maintained throughout the outpatient stay will include: orient to surroundings, keep bed in low position, maintain call bell within reach at all times, provide assistance with transfer out of bed and ambulation.  

## 2023-09-25 NOTE — Patient Instructions (Addendum)
Procedure instructions  Do not eat or drink fluids (other than water) for 6 hours before your procedure  No water for 2 hours before your procedure  Take your blood pressure medicine with a sip of water  Arrive 30 minutes before your appointment  Carefully read the "Preparing for your procedure" detailed instructions  If you have questions call us at [phone]  _____________________________________________________________________    ______________________________________________________________________  Preparing for your procedure  Appointments: If you think you may not be able to keep your appointment, call 24-48 hours in advance to cancel. We need time to make it available to others.  During your procedure appointment there will be: No Prescription Refills. No disability issues to discussed. No medication changes or discussions.  Instructions: Food intake: Avoid eating anything solid for at least 8 hours prior to your procedure. Clear liquid intake: You may take clear liquids such as water up to 2 hours prior to your procedure. (No carbonated drinks. No soda.) Transportation: Unless otherwise stated by your physician, bring a driver. Morning Medicines: Except for blood thinners, take all of your other morning medications with a sip of water. Make sure to take your heart and blood pressure medicines. If your blood pressure's lower number is above 100, the case will be rescheduled. Blood thinners: Make sure to stop your blood thinners as instructed.  If you take a blood thinner, but were not instructed to stop it, call our office [phone] and ask to talk to a nurse. Not stopping a blood thinner prior to certain procedures could lead to serious complications. Diabetics on insulin: Notify the staff so that you can be scheduled 1st case in the morning. If your diabetes requires high dose insulin, take only  of your normal insulin dose the morning of the procedure and  notify the staff that you have done so. Preventing infections: Shower with an antibacterial soap the morning of your procedure.  Build-up your immune system: Take 1000 mg of Vitamin C with every meal (3 times a day) the day prior to your procedure. Antibiotics: Inform the nursing staff if you are taking any antibiotics or if you have any conditions that may require antibiotics prior to procedures. (Example: recent joint implants)   Pregnancy: If you are pregnant make sure to notify the nursing staff. Not doing so may result in injury to the fetus, including death.  Sickness: If you have a cold, fever, or any active infections, call and cancel or reschedule your procedure. Receiving steroids while having an infection may result in complications. Arrival: You must be in the facility at least 30 minutes prior to your scheduled procedure. Tardiness: Your scheduled time is also the cutoff time. If you do not arrive at least 15 minutes prior to your procedure, you will be rescheduled.  Children: Do not bring any children with you. Make arrangements to keep them home. Dress appropriately: There is always a possibility that your clothing may get soiled. Avoid long dresses. Valuables: Do not bring any jewelry or valuables.  Reasons to call and reschedule or cancel your procedure: (Following these recommendations will minimize the risk of a serious complication.) Surgeries: Avoid having procedures within 2 weeks of any surgery. (Avoid for 2 weeks before or after any surgery). Flu Shots: Avoid having procedures within 2 weeks of a flu shots or . (Avoid for 2 weeks before or after immunizations). Barium: Avoid having a procedure within 7-10 days after having had a radiological study involving the use of radiological contrast. (  Myelograms, Barium swallow or enema study). Heart attacks: Avoid any elective procedures or surgeries for the initial 6 months after a "Myocardial Infarction" (Heart Attack). Blood  thinners: It is imperative that you stop these medications before procedures. Let us know if you if you take any blood thinner.  Infection: Avoid procedures during or within two weeks of an infection (including chest colds or gastrointestinal problems). Symptoms associated with infections include: Localized redness, fever, chills, night sweats or profuse sweating, burning sensation when voiding, cough, congestion, stuffiness, runny nose, sore throat, diarrhea, nausea, vomiting, cold or Flu symptoms, recent or current infections. It is specially important if the infection is over the area that we intend to treat. Heart and lung problems: Symptoms that may suggest an active cardiopulmonary problem include: cough, chest pain, breathing difficulties or shortness of breath, dizziness, ankle swelling, uncontrolled high or unusually low blood pressure, and/or palpitations. If you are experiencing any of these symptoms, cancel your procedure and contact your primary care physician for an evaluation.  Remember:  Regular Business hours are:  Monday to Thursday 8:00 AM to 4:00 PM  Provider's Schedule: Francisco Naveira, MD:  Procedure days: Tuesday and Thursday 7:30 AM to 4:00 PM  Bilal [name], MD:  Procedure days: Monday and Wednesday 7:30 AM to 4:00 PM  Epidural Steroid Injection Patient Information  Description: The epidural space surrounds the nerves as they exit the spinal cord.  In some patients, the nerves can be compressed and inflamed by a bulging disc or a tight spinal canal (spinal stenosis).  By injecting steroids into the epidural space, we can bring irritated nerves into direct contact with a potentially helpful medication.  These steroids act directly on the irritated nerves and can reduce swelling and inflammation which often leads to decreased pain.  Epidural steroids may be injected anywhere along the spine and from the neck to the low back depending upon the location of your pain.   After  numbing the skin with local anesthetic (like Novocaine), a small needle is passed into the epidural space slowly.  You may experience a sensation of pressure while this is being done.  The entire block usually last less than 10 minutes.  Conditions which may be treated by epidural steroids:  Low back and leg pain Neck and arm pain Spinal stenosis Post-laminectomy syndrome Herpes zoster (shingles) pain Pain from compression fractures  Preparation for the injection:  Do not eat any solid food or dairy products within 8 hours of your appointment.  You may drink clear liquids up to 3 hours before appointment.  Clear liquids include water, black coffee, juice or soda.  No milk or cream please. You may take your regular medication, including pain medications, with a sip of water before your appointment  Diabetics should hold regular insulin (if taken separately) and take 1/2 normal NPH dos the morning of the procedure.  Carry some sugar containing items with you to your appointment. A driver must accompany you and be prepared to drive you home after your procedure.  Bring all your current medications with your. An IV may be inserted and sedation may be given at the discretion of the physician.   A blood pressure cuff, EKG and other monitors will often be applied during the procedure.  Some patients may need to have extra oxygen administered for a short period. You will be asked to provide medical information, including your allergies, prior to the procedure.  We must know immediately if you are taking blood thinners (like   Coumadin/Warfarin)  Or if you are allergic to IV iodine contrast (dye). We must know if you could possible be pregnant.  Possible side-effects: Bleeding from needle site Infection (rare, may require surgery) Nerve injury (rare) Numbness & tingling (temporary) Difficulty urinating (rare, temporary) Spinal headache ( a headache worse with upright posture) Light -headedness  (temporary) Pain at injection site (several days) Decreased blood pressure (temporary) Weakness in arm/leg (temporary) Pressure sensation in back/neck (temporary)  Call if you experience: Fever/chills associated with headache or increased back/neck pain. Headache worsened by an upright position. New onset weakness or numbness of an extremity below the injection site Hives or difficulty breathing (go to the emergency room) Inflammation or drainage at the infection site Severe back/neck pain Any new symptoms which are concerning to you  Please note:  Although the local anesthetic injected can often make your back or neck feel good for several hours after the injection, the pain will likely return.  It takes 3-7 days for steroids to work in the epidural space.  You may not notice any pain relief for at least that one week.  If effective, we will often do a series of three injections spaced 3-6 weeks apart to maximally decrease your pain.  After the initial series, we generally will wait several months before considering a repeat injection of the same type.  If you have any questions, please call [phone] [facility] 

## 2023-10-05 ENCOUNTER — Telehealth: Payer: Self-pay

## 2023-10-05 NOTE — Telephone Encounter (Signed)
Pt called asking for 800mg  Ibuprofen to be called in to take inbetween her pain meds... please advise

## 2023-10-08 ENCOUNTER — Other Ambulatory Visit: Payer: Self-pay | Admitting: Internal Medicine

## 2023-10-08 DIAGNOSIS — G8929 Other chronic pain: Secondary | ICD-10-CM

## 2023-10-08 MED ORDER — IBUPROFEN 800 MG PO TABS
800.0000 mg | ORAL_TABLET | Freq: Three times a day (TID) | ORAL | 1 refills | Status: DC | PRN
Start: 2023-10-08 — End: 2023-12-18

## 2023-10-09 ENCOUNTER — Telehealth: Payer: Self-pay | Admitting: Student in an Organized Health Care Education/Training Program

## 2023-10-09 NOTE — Telephone Encounter (Signed)
CESI and bilateral TFESI ordered on 09-25-23

## 2023-10-09 NOTE — Telephone Encounter (Signed)
Patient called to see if Insurance has authorized her injections. No response at this time. Patient is asking if Dr Nicole Walter thinks it will help to go to chiropractor ? If so would he send referral to one of his choosing. Please advise patient

## 2023-10-10 NOTE — Telephone Encounter (Signed)
Patient informed. 

## 2023-10-10 NOTE — Telephone Encounter (Signed)
Would a chiropractor be beneficial until she can get a procedurew?

## 2023-10-14 ENCOUNTER — Other Ambulatory Visit: Payer: Self-pay | Admitting: Internal Medicine

## 2023-10-14 DIAGNOSIS — J302 Other seasonal allergic rhinitis: Secondary | ICD-10-CM

## 2023-10-15 ENCOUNTER — Ambulatory Visit: Payer: Medicaid Other | Admitting: Internal Medicine

## 2023-10-15 ENCOUNTER — Encounter: Payer: Self-pay | Admitting: Internal Medicine

## 2023-10-15 VITALS — BP 130/74 | HR 65 | Ht 64.0 in | Wt 162.6 lb

## 2023-10-15 DIAGNOSIS — G8929 Other chronic pain: Secondary | ICD-10-CM | POA: Diagnosis not present

## 2023-10-15 DIAGNOSIS — M5442 Lumbago with sciatica, left side: Secondary | ICD-10-CM

## 2023-10-15 DIAGNOSIS — M5441 Lumbago with sciatica, right side: Secondary | ICD-10-CM | POA: Diagnosis not present

## 2023-10-15 DIAGNOSIS — R1013 Epigastric pain: Secondary | ICD-10-CM | POA: Diagnosis not present

## 2023-10-15 MED ORDER — MILK OF MAGNESIA 7.75 % PO SUSP
30.0000 mL | Freq: Every day | ORAL | 3 refills | Status: DC | PRN
Start: 2023-10-15 — End: 2024-04-16

## 2023-10-15 NOTE — Progress Notes (Signed)
Established Patient Office Visit  Subjective:  Patient ID: Nicole Walter, female    DOB: Apr 08, 1961  Age: 62 y.o. MRN: [id number]  Chief Complaint  Patient presents with  . Follow-up    Discuss referral    C/o uncontrolled lumbar pain as she awaits procedure from Pain management. C/o intermittent dyspepsia with nausea.   No other concerns at this time.   Past Medical History:  Diagnosis Date  . Abnormal liver enzymes 02/08/2016  . Allergy   . Anxiety   . Chest pain 07/05/2016  . Chronic pain   . Depression   . DJD (degenerative joint disease)    lumbar  . Fibromyalgia   . High cholesterol   . Rash of back 01/28/2016  . Weight loss due to medication     Past Surgical History:  Procedure Laterality Date  . ANKLE SURGERY     x 2  . BREAST BIOPSY Left 1997  . CHOLECYSTECTOMY    . COLONOSCOPY    . COLONOSCOPY WITH PROPOFOL N/A 02/22/2016   Procedure: COLONOSCOPY WITH PROPOFOL;  Surgeon: Christena Deem, MD;  Location[facility] ENDOSCOPY;  Service: Endoscopy;  Laterality: N/A;  . ESOPHAGOGASTRODUODENOSCOPY (EGD) WITH PROPOFOL N/A 02/22/2016   Procedure: ESOPHAGOGASTRODUODENOSCOPY (EGD) WITH PROPOFOL;  Surgeon: Christena Deem, MD;  Location: [facility] ENDOSCOPY;  Service: Endoscopy;  Laterality: N/A;  . NASAL SINUS SURGERY    . SHOULDER ARTHROSCOPY WITH ROTATOR CUFF REPAIR AND SUBACROMIAL DECOMPRESSION Left 09/23/2019   Procedure: SHOULDER ARTHROSCOPY WITH MINI ROTATOR CUFF REPAIR AND SUBACROMIAL DECOMPRESSION, BICEPS TENODESIS;  Surgeon: Signa Kell, MD;  Location: [facility];  Service: Orthopedics;  Laterality: Left;  . TONSILLECTOMY    . TUBAL LIGATION      Social History   Socioeconomic History  . Marital status: Legally Separated    Spouse name: Not on file  . Number of children: Not on file  . Years of education: Not on file  . Highest education level: Not on file  Occupational History  . Not on file  Tobacco Use  . Smoking status: Every Day    Current  packs/day: 0.00    Average packs/day: 1 pack/day for 41.3 years (41.3 ttl pk-yrs)    Types: E-cigarettes, Cigarettes    Start date: 03/06/1974    Last attempt to quit: 07/08/2015    Years since quitting: 8.2  . Smokeless tobacco: Never  . Tobacco comments:    vapor cigarettes, no nicotene  Vaping Use  . Vaping status: Every Day  . Start date: 07/08/2015  . Substances: Flavoring  . Devices: Nort, Geek  Substance and Sexual Activity  . Alcohol use: No    Alcohol/week: 0.0 standard drinks of alcohol  . Drug use: No  . Sexual activity: Not Currently    Birth control/protection: Post-menopausal  Other Topics Concern  . Not on file  Social History Narrative  . Not on file   Social Determinants of Health   Financial Resource Strain: Not on file  Food Insecurity: Not on file  Transportation Needs: Not on file  Physical Activity: Not on file  Stress: Not on file  Social Connections: Not on file  Intimate Partner Violence: Not on file    Family History  Problem Relation Age of Onset  . Heart disease Mother   . Stroke Mother   . Cancer Father        lung  . Arthritis Father   . Arthritis Paternal Grandmother   . Arthritis Paternal Grandfather   .  Drug abuse Sister   . Drug abuse Brother   . Post-traumatic stress disorder Brother   . Diabetes Sister   . Dementia Sister   . Drug abuse Brother   . Cancer Brother     Allergies  Allergen Reactions  . Other Hives and Other (See Comments)    Pt states she was tested for allergies and peas was one that she is allergic to but has never ate enough of them to see a reaction. Pt states when she is around bird feathers she gets the hives FEATHERS  . Adhesive [Tape] Hives  . Food     PEAS/ patient does not know what type reaction she has/ from allergy test during childhood  . Pea Other (See Comments)    Other reaction(Raksha Wolfgang): Unknown.  Pt states she was tested for allergies and peas was one that she is allergic to but has never ate  enough of them to see a reaction.    Outpatient Medications Prior to Visit  Medication Sig  . Azelastine HCl 137 MCG/SPRAY SOLN Place 1 Inhalation into the nose daily.  . Calcium Carb-Cholecalciferol (CALCIUM 600 + D) 600-5 MG-MCG TABS Take by mouth.  . cetirizine (ZYRTEC) 10 MG tablet Take 1 tablet (10 mg total) by mouth every morning.  . Cholecalciferol (VITAMIN D3) 125 MCG (5000 UT) CAPS Take 1 capsule (5,000 Units total) by mouth daily.  . fexofenadine (ALLEGRA) 180 MG tablet Take 180 mg by mouth daily.  Marland Kitchen ibuprofen (ADVIL) 800 MG tablet Take 1 tablet (800 mg total) by mouth every 8 (eight) hours as needed.  Marland Kitchen LINZESS 72 MCG capsule TK 1 C PO QD  . Multiple Vitamin (MULTI-VITAMINS) TABS Take 1 tablet by mouth daily.  Marland Kitchen oxyCODONE (OXY IR/ROXICODONE) 5 MG immediate release tablet Take 1 tablet (5 mg total) by mouth every 6 (six) hours as needed for severe pain (pain score 7-10). Prescribed by Sherron Monday, MD  . tiZANidine (ZANAFLEX) 4 MG tablet Take 1 tablet (4 mg total) by mouth 2 (two) times daily.   No facility-administered medications prior to visit.    Review of Systems  Constitutional: Negative.   HENT: Negative.    Eyes: Negative.   Cardiovascular: Negative.   Gastrointestinal: Negative.   Genitourinary: Negative.        Urge incontinence  Musculoskeletal:  Positive for back pain, joint pain and neck pain.       As in hpi  Skin: Negative.   Neurological: Negative.   Endo/Heme/Allergies: Negative.        Objective:   BP 130/74   Pulse 65   Ht 5\' 4"  (1.626 m)   Wt 162 lb 9.6 oz (73.8 kg)   SpO2 95%   BMI 27.91 kg/m   Vitals:   10/15/23 1131  BP: 130/74  Pulse: 65  Height: 5\' 4"  (1.626 m)  Weight: 162 lb 9.6 oz (73.8 kg)  SpO2: 95%  BMI (Calculated): 27.9    Physical Exam Vitals reviewed.  Constitutional:      General: She is not in acute distress. HENT:     Head: Normocephalic.     Nose: Nose normal.     Mouth/Throat:     Mouth: Mucous  membranes are moist.  Eyes:     Extraocular Movements: Extraocular movements intact.     Pupils: Pupils are equal, round, and reactive to light.  Cardiovascular:     Rate and Rhythm: Normal rate and regular rhythm.     Heart sounds: No  murmur heard. Pulmonary:     Effort: Pulmonary effort is normal.     Breath sounds: No rhonchi or rales.  Abdominal:     General: Abdomen is flat.     Palpations: There is no hepatomegaly, splenomegaly or mass.  Musculoskeletal:        General: Normal range of motion.     Cervical back: Normal range of motion. No tenderness.  Skin:    General: Skin is warm and dry.  Neurological:     General: No focal deficit present.     Mental Status: She is alert and oriented to person, place, and time.     Cranial Nerves: No cranial nerve deficit.     Motor: No weakness.  Psychiatric:        Mood and Affect: Mood normal.        Behavior: Behavior normal.     No results found for any visits on 10/15/23.  No results found for this or any previous visit (from the past 2160 hour(Vendela Troung)).    Assessment & Plan:  As per problem list. Problem List Items Addressed This Visit       Nervous and Auditory   Chronic low back pain (Bilateral) (L>R) - Primary (Chronic)   Relevant Orders   Ambulatory referral to Chiropractic     Other   Dyspepsia   Relevant Medications   magnesium hydroxide (MILK OF MAGNESIA) 400 MG/5ML suspension    No follow-ups on file.   Total time spent: 20 minutes  Luna Fuse, MD  10/15/2023   This document may have been prepared by [facility] Voice Recognition software and as such may include unintentional dictation errors.

## 2023-10-16 ENCOUNTER — Encounter: Payer: Medicaid Other | Admitting: Physical Therapy

## 2023-10-19 ENCOUNTER — Ambulatory Visit: Payer: Medicaid Other | Admitting: Internal Medicine

## 2023-10-19 DIAGNOSIS — M5442 Lumbago with sciatica, left side: Secondary | ICD-10-CM | POA: Diagnosis not present

## 2023-10-19 DIAGNOSIS — G8929 Other chronic pain: Secondary | ICD-10-CM

## 2023-10-19 DIAGNOSIS — M5441 Lumbago with sciatica, right side: Secondary | ICD-10-CM | POA: Diagnosis not present

## 2023-10-19 MED ORDER — OXYCODONE HCL 5 MG PO TABS
5.0000 mg | ORAL_TABLET | Freq: Four times a day (QID) | ORAL | 0 refills | Status: DC | PRN
Start: 2023-10-19 — End: 2023-11-19

## 2023-10-19 NOTE — Progress Notes (Signed)
Established Patient Office Visit  Subjective:  Patient ID: Nicole Walter, female    DOB: 06/05/1961  Age: 62 y.o. MRN: [id number]  No chief complaint on file.   Here for pain management follow up. Chronic pain well controlled on current analgesia. Last UDS satisfactory and pill counts have also been satisfactory. Chiropractic intervention has moderately alleviated her pain.  No other concerns at this time.   Past Medical History:  Diagnosis Date   Abnormal liver enzymes 02/08/2016   Allergy    Anxiety    Chest pain 07/05/2016   Chronic pain    Depression    DJD (degenerative joint disease)    lumbar   Fibromyalgia    High cholesterol    Rash of back 01/28/2016   Weight loss due to medication     Past Surgical History:  Procedure Laterality Date   ANKLE SURGERY     x 2   BREAST BIOPSY Left 1997   CHOLECYSTECTOMY     COLONOSCOPY     COLONOSCOPY WITH PROPOFOL N/A 02/22/2016   Procedure: COLONOSCOPY WITH PROPOFOL;  Surgeon: Christena Deem, MD;  Location[facility] ENDOSCOPY;  Service: Endoscopy;  Laterality: N/A;   ESOPHAGOGASTRODUODENOSCOPY (EGD) WITH PROPOFOL N/A 02/22/2016   Procedure: ESOPHAGOGASTRODUODENOSCOPY (EGD) WITH PROPOFOL;  Surgeon: Christena Deem, MD;  Location: [facility] ENDOSCOPY;  Service: Endoscopy;  Laterality: N/A;   NASAL SINUS SURGERY     SHOULDER ARTHROSCOPY WITH ROTATOR CUFF REPAIR AND SUBACROMIAL DECOMPRESSION Left 09/23/2019   Procedure: SHOULDER ARTHROSCOPY WITH MINI ROTATOR CUFF REPAIR AND SUBACROMIAL DECOMPRESSION, BICEPS TENODESIS;  Surgeon: Signa Kell, MD;  Location: [facility];  Service: Orthopedics;  Laterality: Left;   TONSILLECTOMY     TUBAL LIGATION      Social History   Socioeconomic History   Marital status: Legally Separated    Spouse name: Not on file   Number of children: Not on file   Years of education: Not on file   Highest education level: Not on file  Occupational History   Not on file  Tobacco Use   Smoking  status: Every Day    Current packs/day: 0.00    Average packs/day: 1 pack/day for 41.3 years (41.3 ttl pk-yrs)    Types: E-cigarettes, Cigarettes    Start date: 03/06/1974    Last attempt to quit: 07/08/2015    Years since quitting: 8.2   Smokeless tobacco: Never   Tobacco comments:    vapor cigarettes, no nicotene  Vaping Use   Vaping status: Every Day   Start date: 07/08/2015   Substances: Flavoring   Devices: Nort, Geek  Substance and Sexual Activity   Alcohol use: No    Alcohol/week: 0.0 standard drinks of alcohol   Drug use: No   Sexual activity: Not Currently    Birth control/protection: Post-menopausal  Other Topics Concern   Not on file  Social History Narrative   Not on file   Social Drivers of Health   Financial Resource Strain: Not on file  Food Insecurity: Not on file  Transportation Needs: Not on file  Physical Activity: Not on file  Stress: Not on file  Social Connections: Not on file  Intimate Partner Violence: Not on file    Family History  Problem Relation Age of Onset   Heart disease Mother    Stroke Mother    Cancer Father        lung   Arthritis Father    Arthritis Paternal Grandmother    Arthritis Paternal Actor  Drug abuse Sister    Drug abuse Brother    Post-traumatic stress disorder Brother    Diabetes Sister    Dementia Sister    Drug abuse Brother    Cancer Brother     Allergies  Allergen Reactions   Other Hives and Other (See Comments)    Pt states she was tested for allergies and peas was one that she is allergic to but has never ate enough of them to see a reaction. Pt states when she is around bird feathers she gets the hives FEATHERS   Adhesive [Tape] Hives   Food     PEAS/ patient does not know what type reaction she has/ from allergy test during childhood   Pea Other (See Comments)    Other reaction(Trent Theisen): Unknown.  Pt states she was tested for allergies and peas was one that she is allergic to but has never ate enough  of them to see a reaction.    Outpatient Medications Prior to Visit  Medication Sig   Azelastine HCl 137 MCG/SPRAY SOLN Place 1 Inhalation into the nose daily.   Calcium Carb-Cholecalciferol (CALCIUM 600 + D) 600-5 MG-MCG TABS Take by mouth.   cetirizine (ZYRTEC) 10 MG tablet TAKE 1 TABLET BY MOUTH ONCE DAILY IN THE MORNING   Cholecalciferol (VITAMIN D3) 125 MCG (5000 UT) CAPS Take 1 capsule (5,000 Units total) by mouth daily.   fexofenadine (ALLEGRA) 180 MG tablet Take 180 mg by mouth daily.   ibuprofen (ADVIL) 800 MG tablet Take 1 tablet (800 mg total) by mouth every 8 (eight) hours as needed.   LINZESS 72 MCG capsule TK 1 C PO QD   magnesium hydroxide (MILK OF MAGNESIA) 400 MG/5ML suspension Take 30 mLs by mouth daily as needed for mild constipation.   Multiple Vitamin (MULTI-VITAMINS) TABS Take 1 tablet by mouth daily.   tiZANidine (ZANAFLEX) 4 MG tablet Take 1 tablet (4 mg total) by mouth 2 (two) times daily.   [DISCONTINUED] oxyCODONE (OXY IR/ROXICODONE) 5 MG immediate release tablet Take 1 tablet (5 mg total) by mouth every 6 (six) hours as needed for severe pain (pain score 7-10). Prescribed by Sherron Monday, MD   No facility-administered medications prior to visit.    Review of Systems  Constitutional: Negative.   HENT: Negative.    Eyes: Negative.   Cardiovascular: Negative.   Gastrointestinal: Negative.   Genitourinary: Negative.        Urge incontinence  Musculoskeletal:  Positive for back pain, joint pain and neck pain.       As in hpi  Skin: Negative.   Neurological: Negative.   Endo/Heme/Allergies: Negative.        Objective:   BP 128/72   Pulse 65   Ht 5\' 4"  (1.626 m)   Wt 163 lb 3.2 oz (74 kg)   SpO2 98%   BMI 28.01 kg/m   Vitals:   10/19/23 1029  BP: 128/72  Pulse: 65  Height: 5\' 4"  (1.626 m)  Weight: 163 lb 3.2 oz (74 kg)  SpO2: 98%  BMI (Calculated): 28    Physical Exam Vitals reviewed.  Constitutional:      General: She is not in  acute distress. HENT:     Head: Normocephalic.     Nose: Nose normal.     Mouth/Throat:     Mouth: Mucous membranes are moist.  Eyes:     Extraocular Movements: Extraocular movements intact.     Pupils: Pupils are equal, round, and reactive to  light.  Cardiovascular:     Rate and Rhythm: Normal rate and regular rhythm.     Heart sounds: No murmur heard. Pulmonary:     Effort: Pulmonary effort is normal.     Breath sounds: No rhonchi or rales.  Abdominal:     General: Abdomen is flat.     Palpations: There is no hepatomegaly, splenomegaly or mass.  Musculoskeletal:        General: Normal range of motion.     Cervical back: Normal range of motion. No tenderness.  Skin:    General: Skin is warm and dry.  Neurological:     General: No focal deficit present.     Mental Status: She is alert and oriented to person, place, and time.     Cranial Nerves: No cranial nerve deficit.     Motor: No weakness.  Psychiatric:        Mood and Affect: Mood normal.        Behavior: Behavior normal.      No results found for any visits on 10/19/23.  No results found for this or any previous visit (from the past 2160 hours).    Assessment & Plan:  As per problem list. Problem List Items Addressed This Visit       Nervous and Auditory   Chronic low back pain (Bilateral) (L>R) (Chronic)   Relevant Medications   oxyCODONE (OXY IR/ROXICODONE) 5 MG immediate release tablet    Return in about 1 month (around 11/19/2023).   Total time spent: 20 minutes  Luna Fuse, MD  10/19/2023   This document may have been prepared by [facility] Voice Recognition software and as such may include unintentional dictation errors.

## 2023-10-23 ENCOUNTER — Ambulatory Visit: Payer: Medicaid Other | Attending: Internal Medicine | Admitting: Physical Therapy

## 2023-10-24 ENCOUNTER — Encounter: Payer: Self-pay | Admitting: Student in an Organized Health Care Education/Training Program

## 2023-10-24 ENCOUNTER — Ambulatory Visit
Payer: Medicaid Other | Attending: Student in an Organized Health Care Education/Training Program | Admitting: Student in an Organized Health Care Education/Training Program

## 2023-10-24 ENCOUNTER — Ambulatory Visit
Admission: RE | Admit: 2023-10-24 | Discharge: 2023-10-24 | Disposition: A | Discharge status: Home / Self Care | Payer: Medicaid Other | Source: Ambulatory Visit | Attending: Student in an Organized Health Care Education/Training Program | Admitting: Student in an Organized Health Care Education/Training Program

## 2023-10-24 VITALS — BP 110/80 | HR 66 | Temp 97.0°F | Resp 14 | Ht 64.0 in | Wt 163.0 lb

## 2023-10-24 DIAGNOSIS — G8929 Other chronic pain: Secondary | ICD-10-CM | POA: Insufficient documentation

## 2023-10-24 DIAGNOSIS — M5412 Radiculopathy, cervical region: Secondary | ICD-10-CM | POA: Insufficient documentation

## 2023-10-24 DIAGNOSIS — G894 Chronic pain syndrome: Secondary | ICD-10-CM | POA: Insufficient documentation

## 2023-10-24 DIAGNOSIS — M5030 Other cervical disc degeneration, unspecified cervical region: Secondary | ICD-10-CM | POA: Diagnosis not present

## 2023-10-24 DIAGNOSIS — M5416 Radiculopathy, lumbar region: Secondary | ICD-10-CM | POA: Diagnosis not present

## 2023-10-24 MED ORDER — DEXAMETHASONE SODIUM PHOSPHATE 10 MG/ML IJ SOLN
10.0000 mg | Freq: Once | INTRAMUSCULAR | Status: AC
  Administered 2023-10-24: 10 mg
  Filled 2023-10-24: qty 1

## 2023-10-24 MED ORDER — LACTATED RINGERS IV SOLN: Freq: Once | INTRAVENOUS | Status: AC

## 2023-10-24 MED ORDER — ROPIVACAINE HCL 2 MG/ML IJ SOLN
1.0000 mL | Freq: Once | INTRAMUSCULAR | Status: AC
  Administered 2023-10-24: 1 mL via EPIDURAL
  Filled 2023-10-24: qty 20

## 2023-10-24 MED ORDER — SODIUM CHLORIDE 0.9% FLUSH
1.0000 mL | Freq: Once | INTRAVENOUS | Status: AC
  Administered 2023-10-24: 1 mL

## 2023-10-24 MED ORDER — MIDAZOLAM HCL 2 MG/2ML IJ SOLN
0.5000 mg | Freq: Once | INTRAMUSCULAR | Status: AC
  Administered 2023-10-24: 2 mg via INTRAVENOUS
  Filled 2023-10-24: qty 2

## 2023-10-24 MED ORDER — ROPIVACAINE HCL 2 MG/ML IJ SOLN
2.0000 mL | Freq: Once | INTRAMUSCULAR | Status: AC
  Administered 2023-10-24: 2 mL via EPIDURAL
  Filled 2023-10-24: qty 20

## 2023-10-24 MED ORDER — SODIUM CHLORIDE 0.9% FLUSH
2.0000 mL | Freq: Once | INTRAVENOUS | Status: AC
  Administered 2023-10-24: 2 mL

## 2023-10-24 MED ORDER — DEXAMETHASONE SODIUM PHOSPHATE 10 MG/ML IJ SOLN
20.0000 mg | Freq: Once | INTRAMUSCULAR | Status: AC
  Administered 2023-10-24: 20 mg
  Filled 2023-10-24: qty 2

## 2023-10-24 MED ORDER — IOHEXOL 180 MG/ML  SOLN
10.0000 mL | Freq: Once | INTRAMUSCULAR | Status: AC
  Administered 2023-10-24: 10 mL via EPIDURAL
  Filled 2023-10-24: qty 20

## 2023-10-24 MED ORDER — LIDOCAINE HCL 2 % IJ SOLN
20.0000 mL | Freq: Once | INTRAMUSCULAR | Status: AC
  Administered 2023-10-24: 400 mg
  Filled 2023-10-24: qty 20

## 2023-10-24 NOTE — Progress Notes (Signed)
Safety precautions to be maintained throughout the outpatient stay will include: orient to surroundings, keep bed in low position, maintain call bell within reach at all times, provide assistance with transfer out of bed and ambulation.  

## 2023-10-24 NOTE — Progress Notes (Signed)
PROVIDER NOTE: Interpretation of information contained herein should be left to medically-trained personnel. Specific patient instructions are provided elsewhere under "Patient Instructions" section of medical record. This document was created in part using STT-dictation technology, any transcriptional errors that may result from this process are unintentional.  Patient: Nicole Walter Type: Established DOB: 1961-02-21 MRN: [id number] PCP: Sherron Monday, MD  Service: Procedure DOS: 10/24/2023 Setting: Ambulatory Location: Ambulatory outpatient facility Delivery: Face-to-face Provider: Edward Jolly, MD Specialty: Interventional Pain Management Specialty designation: 09 Location: Outpatient facility Ref. Prov.: Edward Jolly, MD       Interventional Therapy   Procedure: Cervical Epidural Steroid injection (CESI) (Interlaminar) #1  Laterality: Right  Level: C7-T1 Imaging: Fluoroscopy-assisted DOS: 10/24/2023  Performed by: Edward Jolly, MD Anesthesia: Local anesthesia (1-2% Lidocaine) Sedation: Minimal Sedation                         Purpose: Diagnostic/Therapeutic Indications: Cervicalgia, cervical radicular pain, degenerative disc disease, severe enough to impact quality of life or function. Cervical Radicular Pain  NAS-11 score:   Pre-procedure: 5 /10   Post-procedure:  (neck-0, lower back-1)/10      Position  Prep  Materials:  Location setting: Procedure suite Position: Prone, on modified reverse trendelenburg to facilitate breathing, with head in head-cradle. Pillows positioned under chest (below chin-level) with cervical spine flexed. Safety Precautions: Patient was assessed for positional comfort and pressure points before starting the procedure. Prepping solution: DuraPrep (Iodine Povacrylex [0.7% available iodine] and Isopropyl Alcohol, 74% w/w) Prep Area: Entire  cervicothoracic region Approach: percutaneous, paramedial Intended target: Posterior cervical  epidural space Materials Procedure:  Tray: Epidural Needle(s): Epidural (Tuohy) Qty: 1 Length: (90mm) 3.5-inch Gauge: 22G   H&P (Pre-op Assessment):  Nicole Walter is a 62 y.o. (year old), female patient, seen today for interventional treatment. She  has a past surgical history that includes Cholecystectomy; Tubal ligation; Ankle surgery; Nasal sinus surgery; Tonsillectomy; Colonoscopy; Esophagogastroduodenoscopy (egd) with propofol (N/A, 02/22/2016); Colonoscopy with propofol (N/A, 02/22/2016); Breast biopsy (Left, 1997); and Shoulder arthroscopy with rotator cuff repair and subacromial decompression (Left, 09/23/2019). Nicole Walter has a current medication list which includes the following prescription(s): azelastine hcl, calcium carb-cholecalciferol, cetirizine, vitamin d3, fexofenadine, ibuprofen, linzess, milk of magnesia, multi-vitamins, oxycodone, and tizanidine. Her primarily concern today is the Neck Pain (Right is worse ) and Back Pain (Lumbar right is worse )  Initial Vital Signs:  Pulse/HCG Rate: 68  Temp: (!) 97 F (36.1 C) Resp: 16 BP: (!) 146/70 SpO2: 100 %  BMI: Estimated body mass index is 27.98 kg/m as calculated from the following:   Height as of this encounter: 5\' 4"  (1.626 m).   Weight as of this encounter: 163 lb (73.9 kg).  Risk Assessment: Allergies: Reviewed. She is allergic to other, adhesive [tape], food, and pea.  Allergy Precautions: None required Coagulopathies: Reviewed. None identified.  Blood-thinner therapy: None at this time Active Infection(s): Reviewed. None identified. Nicole Walter is afebrile  Site Confirmation: Nicole Walter was asked to confirm the procedure and laterality before marking the site Procedure checklist: Completed Consent: Before the procedure and under the influence of no sedative(s), amnesic(s), or anxiolytics, the patient was informed of the treatment options, risks and possible complications. To fulfill our ethical and legal obligations, as  recommended by the American Medical Association's Code of Ethics, I have informed the patient of my clinical impression; the nature and purpose of the treatment or procedure; the risks, benefits, and possible complications of the intervention; the  alternatives, including doing nothing; the risk(s) and benefit(s) of the alternative treatment(s) or procedure(s); and the risk(s) and benefit(s) of doing nothing. The patient was provided information about the general risks and possible complications associated with the procedure. These may include, but are not limited to: failure to achieve desired goals, infection, bleeding, organ or nerve damage, allergic reactions, paralysis, and death. In addition, the patient was informed of those risks and complications associated to Spine-related procedures, such as failure to decrease pain; infection (i.e.: Meningitis, epidural or intraspinal abscess); bleeding (i.e.: epidural hematoma, subarachnoid hemorrhage, or any other type of intraspinal or peri-dural bleeding); organ or nerve damage (i.e.: Any type of peripheral nerve, nerve root, or spinal cord injury) with subsequent damage to sensory, motor, and/or autonomic systems, resulting in permanent pain, numbness, and/or weakness of one or several areas of the body; allergic reactions; (i.e.: anaphylactic reaction); and/or death. Furthermore, the patient was informed of those risks and complications associated with the medications. These include, but are not limited to: allergic reactions (i.e.: anaphylactic or anaphylactoid reaction(s)); adrenal axis suppression; blood sugar elevation that in diabetics may result in ketoacidosis or comma; water retention that in patients with history of congestive heart failure may result in shortness of breath, pulmonary edema, and decompensation with resultant heart failure; weight gain; swelling or edema; medication-induced neural toxicity; particulate matter embolism and blood vessel  occlusion with resultant organ, and/or nervous system infarction; and/or aseptic necrosis of one or more joints. Finally, the patient was informed that Medicine is not an exact science; therefore, there is also the possibility of unforeseen or unpredictable risks and/or possible complications that may result in a catastrophic outcome. The patient indicated having understood very clearly. We have given the patient no guarantees and we have made no promises. Enough time was given to the patient to ask questions, all of which were answered to the patient's satisfaction. Nicole Walter has indicated that she wanted to continue with the procedure. Attestation: I, the ordering provider, attest that I have discussed with the patient the benefits, risks, side-effects, alternatives, likelihood of achieving goals, and potential problems during recovery for the procedure that I have provided informed consent. Date  Time: 10/24/2023  8:33 AM   Pre-Procedure Preparation:  Monitoring: As per clinic protocol. Respiration, ETCO2, SpO2, BP, heart rate and rhythm monitor placed and checked for adequate function Safety Precautions: Patient was assessed for positional comfort and pressure points before starting the procedure. Time-out: I initiated and conducted the "Time-out" before starting the procedure, as per protocol. The patient was asked to participate by confirming the accuracy of the "Time Out" information. Verification of the correct person, site, and procedure were performed and confirmed by me, the nursing staff, and the patient. "Time-out" conducted as per Joint Commission's Universal Protocol (UP.01.01.01). Time: 0913 Start Time: 0913 hrs.  Description  Narrative of Procedure:          Rationale (medical necessity): procedure needed and proper for the diagnosis and/or treatment of the patient's medical symptoms and needs. Start Time: 0913 hrs. Safety Precautions: Aspiration looking for blood return was  conducted prior to all injections. At no point did we inject any substances, as a needle was being advanced. No attempts were made at seeking any paresthesias. Safe injection practices and needle disposal techniques used. Medications properly checked for expiration dates. SDV (single dose vial) medications used. Description of procedure: Protocol guidelines were followed. The patient was assisted into a comfortable position. The target area was identified and the area prepped  in the usual manner. Skin & deeper tissues infiltrated with local anesthetic. Appropriate amount of time allowed to pass for local anesthetics to take effect. Using fluoroscopic guidance, the epidural needle was introduced through the skin, ipsilateral to the reported pain, and advanced to the target area. Posterior laminar os was contacted and the needle walked caudad, until the lamina was cleared. The ligamentum flavum was engaged and the epidural space identified using "loss-of-resistance technique" with 2-3 ml of PF-NaCl (0.9% NSS), in a 5cc dedicated LOR syringe. (See "Imaging guidance" below for use of contrast details.) Once proper needle placement was secured, and negative aspiration confirmed, the solution was injected in intermittent fashion, asking for systemic symptoms every 0.5cc. The needles were then removed and the area cleansed, making sure to leave some of the prepping solution back to take advantage of its long term bactericidal properties.  3 cc solution made of 1 cc of preservative-free saline, 1 cc of 0.2% ropivacaine, 1 cc of Decadron 10 mg/cc.   Vitals:   10/24/23 0915 10/24/23 0920 10/24/23 0924 10/24/23 0936  BP: 120/64 122/71 121/72 110/80  Pulse: 69 64 62 66  Resp: 13 16 16 14   Temp:      TempSrc:      SpO2: 100% 99% 100% 100%  Weight:      Height:         End Time:   hrs.  Imaging Guidance (Spinal):          Type of Imaging Technique: Fluoroscopy Guidance (Spinal) Indication(s): Fluoroscopy  guidance for needle placement to enhance accuracy in procedures requiring precise needle localization for targeted delivery of medication in or near specific anatomical locations not easily accessible without such real-time imaging assistance. Exposure Time: Please see nurses notes. Contrast: Before injecting any contrast, we confirmed that the patient did not have an allergy to iodine, shellfish, or radiological contrast. Once satisfactory needle placement was completed at the desired level, radiological contrast was injected. Contrast injected under live fluoroscopy. No contrast complications. See chart for type and volume of contrast used. Fluoroscopic Guidance: I was personally present during the use of fluoroscopy. "Tunnel Vision Technique" used to obtain the best possible view of the target area. Parallax error corrected before commencing the procedure. "Direction-depth-direction" technique used to introduce the needle under continuous pulsed fluoroscopy. Once target was reached, antero-posterior, oblique, and lateral fluoroscopic projection used confirm needle placement in all planes. Images permanently stored in EMR. Interpretation: I personally interpreted the imaging intraoperatively. Adequate needle placement confirmed in multiple planes. Appropriate spread of contrast into desired area was observed. No evidence of afferent or efferent intravascular uptake. No intrathecal or subarachnoid spread observed. Permanent images saved into the patient's record.  Post-operative Assessment:  Post-procedure Vital Signs:  Pulse/HCG Rate: 66  Temp: (!) 97 F (36.1 C) Resp: 14 BP: 110/80 SpO2: 100 %  EBL: None  Complications: No immediate post-treatment complications observed by team, or reported by patient.  Note: The patient tolerated the entire procedure well. A repeat set of vitals were taken after the procedure and the patient was kept under observation following institutional policy, for this  type of procedure. Post-procedural neurological assessment was performed, showing return to baseline, prior to discharge. The patient was provided with post-procedure discharge instructions, including a section on how to identify potential problems. Should any problems arise concerning this procedure, the patient was given instructions to immediately contact us, at any time, without hesitation. In any case, we plan to contact the patient by telephone for  a follow-up status report regarding this interventional procedure.  Comments:  No additional relevant information.  Plan of Care (POC)  Orders:  Orders Placed This Encounter  Procedures   DG PAIN CLINIC C-ARM 1-60 MIN NO REPORT    Intraoperative interpretation by procedural physician at [facility].    Standing Status:   Standing    Number of Occurrences:   1    Reason for exam::   Assistance in needle guidance and placement for procedures requiring needle placement in or near specific anatomical locations not easily accessible without such assistance.    Medications ordered for procedure: Meds ordered this encounter  Medications   iohexol (OMNIPAQUE) 180 MG/ML injection 10 mL    Must be Myelogram-compatible. If not available, you may substitute with a water-soluble, non-ionic, hypoallergenic, myelogram-compatible radiological contrast medium.   lidocaine (XYLOCAINE) 2 % (with pres) injection 400 mg   lactated ringers infusion   midazolam (VERSED) injection 0.5-2 mg    Make sure Flumazenil is available in the pyxis when using this medication. If oversedation occurs, administer 0.2 mg IV over 15 sec. If after 45 sec no response, administer 0.2 mg again over 1 min; may repeat at 1 min intervals; not to exceed 4 doses (1 mg)   sodium chloride flush (NS) 0.9 % injection 1 mL   ropivacaine (PF) 2 mg/mL (0.2%) (NAROPIN) injection 1 mL   dexamethasone (DECADRON) injection 10 mg   sodium chloride flush (NS) 0.9 % injection 2 mL    This  is for a two (2) level block. Use two (2) syringes and divide content in half.   ropivacaine (PF) 2 mg/mL (0.2%) (NAROPIN) injection 2 mL    This is for a two (2) level block. Use two (2) syringes and divide content in half.   dexamethasone (DECADRON) injection 20 mg    This is for a two (2) level block. Use two (2) syringes and divide content in half.   Medications administered: We administered iohexol, lidocaine, lactated ringers, midazolam, sodium chloride flush, ropivacaine (PF) 2 mg/mL (0.2%), dexamethasone, sodium chloride flush, ropivacaine (PF) 2 mg/mL (0.2%), and dexamethasone.  See the medical record for exact dosing, route, and time of administration.  Follow-up plan:   Return in about 6 weeks (around 12/05/2023) for F2F PPE.       Recent Visits Date Type Provider Dept  09/25/23 Office Visit Edward Jolly, MD [facility]  Showing recent visits within past 90 days and meeting all other requirements Today's Visits Date Type Provider Dept  10/24/23 Procedure visit Edward Jolly, MD [facility]  Showing today's visits and meeting all other requirements Future Appointments Date Type Provider Dept  12/05/23 Appointment Edward Jolly, MD [facility]  Showing future appointments within next 90 days and meeting all other requirements  Disposition: Discharge home  Discharge (Date  Time): 10/24/2023; 0940 hrs.   Primary Care Physician: Sherron Monday, MD Location: [facility] Note by: Edward Jolly, MD (TTS technology used. I apologize for any typographical errors that were not detected and corrected.) Date: 10/24/2023; Time: 11:34 AM  Disclaimer:  Medicine is not an Visual merchandiser. The only guarantee in medicine is that nothing is guaranteed. It is important to note that the decision to proceed with this intervention was based on the information collected from the patient. The Data and conclusions were drawn from the  patient's questionnaire, the interview, and the physical examination. Because the information was provided in large part by  the patient, it cannot be guaranteed that it has not been purposely or unconsciously manipulated. Every effort has been made to obtain as much relevant data as possible for this evaluation. It is important to note that the conclusions that lead to this procedure are derived in large part from the available data. Always take into account that the treatment will also be dependent on availability of resources and existing treatment guidelines, considered by other Pain Management Practitioners as being common knowledge and practice, at the time of the intervention. For Medico-Legal purposes, it is also important to point out that variation in procedural techniques and pharmacological choices are the acceptable norm. The indications, contraindications, technique, and results of the above procedure should only be interpreted and judged by a Board-Certified Interventional Pain Specialist with extensive familiarity and expertise in the same exact procedure and technique.

## 2023-10-24 NOTE — Progress Notes (Signed)
PROVIDER NOTE: Interpretation of information contained herein should be left to medically-trained personnel. Specific patient instructions are provided elsewhere under "Patient Instructions" section of medical record. This document was created in part using STT-dictation technology, any transcriptional errors that may result from this process are unintentional.  Patient: Nicole Walter Type: Established DOB: Apr 18, 1961 MRN: [id number] PCP: Sherron Monday, MD  Service: Procedure DOS: 10/24/2023 Setting: Ambulatory Location: Ambulatory outpatient facility Delivery: Face-to-face Provider: Edward Jolly, MD Specialty: Interventional Pain Management Specialty designation: 09 Location: Outpatient facility Ref. Prov.: Edward Jolly, MD       Interventional Therapy   Procedure: Cervical Epidural Steroid injection (CESI) (Interlaminar) #1  Laterality: Midline  Level: C7-T1 Imaging: Fluoroscopy-assisted DOS: 10/24/2023  Performed by: Edward Jolly, MD Anesthesia: Local anesthesia (1-2% Lidocaine) Sedation: Minimal Sedation                         Purpose: Diagnostic/Therapeutic Indications: Cervicalgia, cervical radicular pain, degenerative disc disease, severe enough to impact quality of life or function. Cervical Radicular Pain   NAS-11 score:   Pre-procedure: 5 /10   Post-procedure: 5 /10      Position  Prep  Materials:  Location setting: Procedure suite Position: Prone, on modified reverse trendelenburg to facilitate breathing, with head in head-cradle. Pillows positioned under chest (below chin-level) with cervical spine flexed. Safety Precautions: Patient was assessed for positional comfort and pressure points before starting the procedure. Prepping solution: DuraPrep (Iodine Povacrylex [0.7% available iodine] and Isopropyl Alcohol, 74% w/w) Prep Area: Entire  cervicothoracic region Approach: percutaneous, paramedial Intended target: Posterior cervical epidural  space Materials Procedure:  Tray: Epidural Needle(s): Epidural (Tuohy) Qty: 1 Length: (90mm) 3.5-inch Gauge: 22G   H&P (Pre-op Assessment):  Nicole Walter is a 62 y.o. (year old), female patient, seen today for interventional treatment. She  has a past surgical history that includes Cholecystectomy; Tubal ligation; Ankle surgery; Nasal sinus surgery; Tonsillectomy; Colonoscopy; Esophagogastroduodenoscopy (egd) with propofol (N/A, 02/22/2016); Colonoscopy with propofol (N/A, 02/22/2016); Breast biopsy (Left, 1997); and Shoulder arthroscopy with rotator cuff repair and subacromial decompression (Left, 09/23/2019). Nicole Walter has a current medication list which includes the following prescription(s): azelastine hcl, calcium carb-cholecalciferol, cetirizine, vitamin d3, fexofenadine, ibuprofen, linzess, milk of magnesia, multi-vitamins, oxycodone, and tizanidine, and the following Facility-Administered Medications: dexamethasone, dexamethasone, iohexol, lactated ringers, lidocaine, midazolam, ropivacaine (pf) 2 mg/ml (0.2%), ropivacaine (pf) 2 mg/ml (0.2%), sodium chloride flush, and sodium chloride flush. Her primarily concern today is the Neck Pain (Right is worse ) and Back Pain (Lumbar right is worse )  Initial Vital Signs:  Pulse/HCG Rate: 68  Temp: (!) 97 F (36.1 C) Resp: 16 BP: (!) 146/70 SpO2: 100 %  BMI: Estimated body mass index is 27.98 kg/m as calculated from the following:   Height as of this encounter: 5\' 4"  (1.626 m).   Weight as of this encounter: 163 lb (73.9 kg).  Risk Assessment: Allergies: Reviewed. She is allergic to other, adhesive [tape], food, and pea.  Allergy Precautions: None required Coagulopathies: Reviewed. None identified.  Blood-thinner therapy: None at this time Active Infection(s): Reviewed. None identified. Nicole Walter is afebrile  Site Confirmation: Nicole Walter was asked to confirm the procedure and laterality before marking the site Procedure checklist:  Completed Consent: Before the procedure and under the influence of no sedative(s), amnesic(s), or anxiolytics, the patient was informed of the treatment options, risks and possible complications. To fulfill our ethical and legal obligations, as recommended by the American Medical Association's Code of Ethics,  I have informed the patient of my clinical impression; the nature and purpose of the treatment or procedure; the risks, benefits, and possible complications of the intervention; the alternatives, including doing nothing; the risk(s) and benefit(s) of the alternative treatment(s) or procedure(s); and the risk(s) and benefit(s) of doing nothing. The patient was provided information about the general risks and possible complications associated with the procedure. These may include, but are not limited to: failure to achieve desired goals, infection, bleeding, organ or nerve damage, allergic reactions, paralysis, and death. In addition, the patient was informed of those risks and complications associated to Spine-related procedures, such as failure to decrease pain; infection (i.e.: Meningitis, epidural or intraspinal abscess); bleeding (i.e.: epidural hematoma, subarachnoid hemorrhage, or any other type of intraspinal or peri-dural bleeding); organ or nerve damage (i.e.: Any type of peripheral nerve, nerve root, or spinal cord injury) with subsequent damage to sensory, motor, and/or autonomic systems, resulting in permanent pain, numbness, and/or weakness of one or several areas of the body; allergic reactions; (i.e.: anaphylactic reaction); and/or death. Furthermore, the patient was informed of those risks and complications associated with the medications. These include, but are not limited to: allergic reactions (i.e.: anaphylactic or anaphylactoid reaction(s)); adrenal axis suppression; blood sugar elevation that in diabetics may result in ketoacidosis or comma; water retention that in patients with history  of congestive heart failure may result in shortness of breath, pulmonary edema, and decompensation with resultant heart failure; weight gain; swelling or edema; medication-induced neural toxicity; particulate matter embolism and blood vessel occlusion with resultant organ, and/or nervous system infarction; and/or aseptic necrosis of one or more joints. Finally, the patient was informed that Medicine is not an exact science; therefore, there is also the possibility of unforeseen or unpredictable risks and/or possible complications that may result in a catastrophic outcome. The patient indicated having understood very clearly. We have given the patient no guarantees and we have made no promises. Enough time was given to the patient to ask questions, all of which were answered to the patient's satisfaction. Ms. Harpole has indicated that she wanted to continue with the procedure. Attestation: I, the ordering provider, attest that I have discussed with the patient the benefits, risks, side-effects, alternatives, likelihood of achieving goals, and potential problems during recovery for the procedure that I have provided informed consent. Date  Time: 10/24/2023  8:33 AM   Pre-Procedure Preparation:  Monitoring: As per clinic protocol. Respiration, ETCO2, SpO2, BP, heart rate and rhythm monitor placed and checked for adequate function Safety Precautions: Patient was assessed for positional comfort and pressure points before starting the procedure. Time-out: I initiated and conducted the "Time-out" before starting the procedure, as per protocol. The patient was asked to participate by confirming the accuracy of the "Time Out" information. Verification of the correct person, site, and procedure were performed and confirmed by me, the nursing staff, and the patient. "Time-out" conducted as per Joint Commission's Universal Protocol (UP.01.01.01). Time:   Start Time:   hrs.  Description  Narrative of Procedure:           Rationale (medical necessity): procedure needed and proper for the diagnosis and/or treatment of the patient's medical symptoms and needs. Start Time:   hrs. Safety Precautions: Aspiration looking for blood return was conducted prior to all injections. At no point did we inject any substances, as a needle was being advanced. No attempts were made at seeking any paresthesias. Safe injection practices and needle disposal techniques used. Medications properly checked for expiration  dates. SDV (single dose vial) medications used. Description of procedure: Protocol guidelines were followed. The patient was assisted into a comfortable position. The target area was identified and the area prepped in the usual manner. Skin & deeper tissues infiltrated with local anesthetic. Appropriate amount of time allowed to pass for local anesthetics to take effect. Using fluoroscopic guidance, the epidural needle was introduced through the skin, ipsilateral to the reported pain, and advanced to the target area. Posterior laminar os was contacted and the needle walked caudad, until the lamina was cleared. The ligamentum flavum was engaged and the epidural space identified using "loss-of-resistance technique" with 2-3 ml of PF-NaCl (0.9% NSS), in a 5cc dedicated LOR syringe. (See "Imaging guidance" below for use of contrast details.) Once proper needle placement was secured, and negative aspiration confirmed, the solution was injected in intermittent fashion, asking for systemic symptoms every 0.5cc. The needles were then removed and the area cleansed, making sure to leave some of the prepping solution back to take advantage of its long term bactericidal properties.  Vitals:   10/24/23 0836  BP: (!) 146/70  Pulse: 68  Resp: 16  Temp: (!) 97 F (36.1 C)  TempSrc: Temporal  SpO2: 100%  Weight: 163 lb (73.9 kg)  Height: 5\' 4"  (1.626 m)     End Time:   hrs.  Imaging Guidance (Spinal):          Type of Imaging  Technique: Fluoroscopy Guidance (Spinal) Indication(s): Fluoroscopy guidance for needle placement to enhance accuracy in procedures requiring precise needle localization for targeted delivery of medication in or near specific anatomical locations not easily accessible without such real-time imaging assistance. Exposure Time: Please see nurses notes. Contrast: Before injecting any contrast, we confirmed that the patient did not have an allergy to iodine, shellfish, or radiological contrast. Once satisfactory needle placement was completed at the desired level, radiological contrast was injected. Contrast injected under live fluoroscopy. No contrast complications. See chart for type and volume of contrast used. Fluoroscopic Guidance: I was personally present during the use of fluoroscopy. "Tunnel Vision Technique" used to obtain the best possible view of the target area. Parallax error corrected before commencing the procedure. "Direction-depth-direction" technique used to introduce the needle under continuous pulsed fluoroscopy. Once target was reached, antero-posterior, oblique, and lateral fluoroscopic projection used confirm needle placement in all planes. Images permanently stored in EMR. Interpretation: I personally interpreted the imaging intraoperatively. Adequate needle placement confirmed in multiple planes. Appropriate spread of contrast into desired area was observed. No evidence of afferent or efferent intravascular uptake. No intrathecal or subarachnoid spread observed. Permanent images saved into the patient's record.  Post-operative Assessment:  Post-procedure Vital Signs:  Pulse/HCG Rate: 68  Temp: (!) 97 F (36.1 C) Resp: 16 BP: (!) 146/70 SpO2: 100 %  EBL: None  Complications: No immediate post-treatment complications observed by team, or reported by patient.  Note: The patient tolerated the entire procedure well. A repeat set of vitals were taken after the procedure and the  patient was kept under observation following institutional policy, for this type of procedure. Post-procedural neurological assessment was performed, showing return to baseline, prior to discharge. The patient was provided with post-procedure discharge instructions, including a section on how to identify potential problems. Should any problems arise concerning this procedure, the patient was given instructions to immediately contact us, at any time, without hesitation. In any case, we plan to contact the patient by telephone for a follow-up status report regarding this interventional procedure.  Comments:  No additional relevant information.  Plan of Care (POC)  Orders:  Orders Placed This Encounter  Procedures   DG PAIN CLIN[facility] C-ARM 1-60 MIN NO REPORT    Intraoperative interpretation by procedural physician at [facility].    Standing Status:   Standing    Number of Occurrences:   1    Reason for exam::   Assistance in needle guidance and placement for procedures requiring needle placement in or near specific anatomical locations not easily accessible without such assistance.    Medications ordered for procedure: Meds ordered this encounter  Medications   iohexol (OMNIPAQUE) 180 MG/ML injection 10 mL    Must be Myelogram-compatible. If not available, you may substitute with a water-soluble, non-ionic, hypoallergenic, myelogram-compatible radiological contrast medium.   lidocaine (XYLOCAINE) 2 % (with pres) injection 400 mg   lactated ringers infusion   midazolam (VERSED) injection 0.5-2 mg    Make sure Flumazenil is available in the pyxis when using this medication. If oversedation occurs, administer 0.2 mg IV over 15 sec. If after 45 sec no response, administer 0.2 mg again over 1 min; may repeat at 1 min intervals; not to exceed 4 doses (1 mg)   sodium chloride flush (NS) 0.9 % injection 1 mL   ropivacaine (PF) 2 mg/mL (0.2%) (NAROPIN) injection 1 mL   dexamethasone (DECADRON)  injection 10 mg   sodium chloride flush (NS) 0.9 % injection 2 mL    This is for a two (2) level block. Use two (2) syringes and divide content in half.   ropivacaine (PF) 2 mg/mL (0.2%) (NAROPIN) injection 2 mL    This is for a two (2) level block. Use two (2) syringes and divide content in half.   dexamethasone (DECADRON) injection 20 mg    This is for a two (2) level block. Use two (2) syringes and divide content in half.   Medications administered: Lanetra Herriges had no medications administered during this visit.  See the medical record for exact dosing, route, and time of administration.  Follow-up plan:   No follow-ups on file.       Recent Visits Date Type Provider Dept  09/25/23 Office Visit Edward Jolly, MD [facility]  Showing recent visits within past 90 days and meeting all other requirements Today's Visits Date Type Provider Dept  10/24/23 Procedure visit Edward Jolly, MD [facility]  Showing today's visits and meeting all other requirements Future Appointments No visits were found meeting these conditions. Showing future appointments within next 90 days and meeting all other requirements  Disposition: Discharge home  Discharge (Date  Time): 10/24/2023;   hrs.   Primary Care Physician: Sherron Monday, MD Location: [facility] Note by: Edward Jolly, MD (TTS technology used. I apologize for any typographical errors that were not detected and corrected.) Date: 10/24/2023; Time: 9:03 AM  Disclaimer:  Medicine is not an Visual merchandiser. The only guarantee in medicine is that nothing is guaranteed. It is important to note that the decision to proceed with this intervention was based on the information collected from the patient. The Data and conclusions were drawn from the patient's questionnaire, the interview, and the physical examination. Because the information was provided in large part by the patient, it cannot  be guaranteed that it has not been purposely or unconsciously manipulated. Every effort has been made to obtain as much relevant data as possible for this evaluation. It is important to note that the conclusions that  lead to this procedure are derived in large part from the available data. Always take into account that the treatment will also be dependent on availability of resources and existing treatment guidelines, considered by other Pain Management Practitioners as being common knowledge and practice, at the time of the intervention. For Medico-Legal purposes, it is also important to point out that variation in procedural techniques and pharmacological choices are the acceptable norm. The indications, contraindications, technique, and results of the above procedure should only be interpreted and judged by a Board-Certified Interventional Pain Specialist with extensive familiarity and expertise in the same exact procedure and technique.

## 2023-10-24 NOTE — Progress Notes (Signed)
PROVIDER NOTE: Interpretation of information contained herein should be left to medically-trained personnel. Specific patient instructions are provided elsewhere under "Patient Instructions" section of medical record. This document was created in part using STT-dictation technology, any transcriptional errors that may result from this process are unintentional.  Patient: Nicole Walter Type: Established DOB: July 05, 1961 MRN: [id number] PCP: Sherron Monday, MD  Service: Procedure DOS: 10/24/2023 Setting: Ambulatory Location: Ambulatory outpatient facility Delivery: Face-to-face Provider: Edward Jolly, MD Specialty: Interventional Pain Management Specialty designation: 09 Location: Outpatient facility Ref. Prov.: Edward Jolly, MD       Interventional Therapy   Procedure: Lumbar trans-foraminal epidural steroid injection (L-TFESI) #1  Laterality: Bilateral (-50)  Level: L5 nerve root(s) Imaging: Fluoroscopy-guided         Anesthesia: Local anesthesia (1-2% Lidocaine) Anxiolysis: IV Versed         Sedation: Minimal Sedation                       DOS: 10/24/2023  Performed by: Edward Jolly, MD  Purpose: Diagnostic/Therapeutic Indications: Lumbar radicular pain severe enough to impact quality of life or function. Lumbar Radicular Pain  NAS-11 Pain score:   Pre-procedure: 5 /10   Post-procedure:  (neck-0, lower back-1)/10     Position / Prep / Materials:  Position: Prone  Prep solution: ChloraPrep (2% chlorhexidine gluconate and 70% isopropyl alcohol) Prep Area: Entire Posterior Lumbosacral Area.  From the lower tip of the scapula down to the tailbone and from flank to flank. Materials:  Tray: Block Needle(s):  Type: Spinal  Gauge (G): 22  Length: 3.5-in  Qty: 2      H&P (Pre-op Assessment):  Nicole Walter is a 61 y.o. (year old), female patient, seen today for interventional treatment. She  has a past surgical history that includes Cholecystectomy; Tubal ligation; Ankle  surgery; Nasal sinus surgery; Tonsillectomy; Colonoscopy; Esophagogastroduodenoscopy (egd) with propofol (N/A, 02/22/2016); Colonoscopy with propofol (N/A, 02/22/2016); Breast biopsy (Left, 1997); and Shoulder arthroscopy with rotator cuff repair and subacromial decompression (Left, 09/23/2019). Nicole Walter has a current medication list which includes the following prescription(s): azelastine hcl, calcium carb-cholecalciferol, cetirizine, vitamin d3, fexofenadine, ibuprofen, linzess, milk of magnesia, multi-vitamins, oxycodone, and tizanidine. Her primarily concern today is the Neck Pain (Right is worse ) and Back Pain (Lumbar right is worse )  Initial Vital Signs:  Pulse/HCG Rate: 68  Temp: (!) 97 F (36.1 C) Resp: 16 BP: (!) 146/70 SpO2: 100 %  BMI: Estimated body mass index is 27.98 kg/m as calculated from the following:   Height as of this encounter: 5\' 4"  (1.626 m).   Weight as of this encounter: 163 lb (73.9 kg).  Risk Assessment: Allergies: Reviewed. She is allergic to other, adhesive [tape], food, and pea.  Allergy Precautions: None required Coagulopathies: Reviewed. None identified.  Blood-thinner therapy: None at this time Active Infection(s): Reviewed. None identified. Nicole Walter is afebrile  Site Confirmation: Nicole Walter was asked to confirm the procedure and laterality before marking the site Procedure checklist: Completed Consent: Before the procedure and under the influence of no sedative(s), amnesic(s), or anxiolytics, the patient was informed of the treatment options, risks and possible complications. To fulfill our ethical and legal obligations, as recommended by the American Medical Association's Code of Ethics, I have informed the patient of my clinical impression; the nature and purpose of the treatment or procedure; the risks, benefits, and possible complications of the intervention; the alternatives, including doing nothing; the risk(s) and benefit(s) of the alternative  treatment(s) or procedure(s); and the risk(s) and benefit(s) of doing nothing. The patient was provided information about the general risks and possible complications associated with the procedure. These may include, but are not limited to: failure to achieve desired goals, infection, bleeding, organ or nerve damage, allergic reactions, paralysis, and death. In addition, the patient was informed of those risks and complications associated to Spine-related procedures, such as failure to decrease pain; infection (i.e.: Meningitis, epidural or intraspinal abscess); bleeding (i.e.: epidural hematoma, subarachnoid hemorrhage, or any other type of intraspinal or peri-dural bleeding); organ or nerve damage (i.e.: Any type of peripheral nerve, nerve root, or spinal cord injury) with subsequent damage to sensory, motor, and/or autonomic systems, resulting in permanent pain, numbness, and/or weakness of one or several areas of the body; allergic reactions; (i.e.: anaphylactic reaction); and/or death. Furthermore, the patient was informed of those risks and complications associated with the medications. These include, but are not limited to: allergic reactions (i.e.: anaphylactic or anaphylactoid reaction(s)); adrenal axis suppression; blood sugar elevation that in diabetics may result in ketoacidosis or comma; water retention that in patients with history of congestive heart failure may result in shortness of breath, pulmonary edema, and decompensation with resultant heart failure; weight gain; swelling or edema; medication-induced neural toxicity; particulate matter embolism and blood vessel occlusion with resultant organ, and/or nervous system infarction; and/or aseptic necrosis of one or more joints. Finally, the patient was informed that Medicine is not an exact science; therefore, there is also the possibility of unforeseen or unpredictable risks and/or possible complications that may result in a catastrophic  outcome. The patient indicated having understood very clearly. We have given the patient no guarantees and we have made no promises. Enough time was given to the patient to ask questions, all of which were answered to the patient's satisfaction. Ms. Risi has indicated that she wanted to continue with the procedure. Attestation: I, the ordering provider, attest that I have discussed with the patient the benefits, risks, side-effects, alternatives, likelihood of achieving goals, and potential problems during recovery for the procedure that I have provided informed consent. Date  Time: 10/24/2023  8:33 AM   Pre-Procedure Preparation:  Monitoring: As per clinic protocol. Respiration, ETCO2, SpO2, BP, heart rate and rhythm monitor placed and checked for adequate function Safety Precautions: Patient was assessed for positional comfort and pressure points before starting the procedure. Time-out: I initiated and conducted the "Time-out" before starting the procedure, as per protocol. The patient was asked to participate by confirming the accuracy of the "Time Out" information. Verification of the correct person, site, and procedure were performed and confirmed by me, the nursing staff, and the patient. "Time-out" conducted as per Joint Commission's Universal Protocol (UP.01.01.01). Time: 0913 Start Time: 0913 hrs.  Description/Narrative of Procedure:          Target: The 6 o'clock position under the pedicle, on the affected side. Region: Posterolateral Lumbosacral Approach: Posterior Percutaneous Paravertebral approach.  Rationale (medical necessity): procedure needed and proper for the diagnosis and/or treatment of the patient's medical symptoms and needs. Procedural Technique Safety Precautions: Aspiration looking for blood return was conducted prior to all injections. At no point did we inject any substances, as a needle was being advanced. No attempts were made at seeking any paresthesias. Safe  injection practices and needle disposal techniques used. Medications properly checked for expiration dates. SDV (single dose vial) medications used. Description of the Procedure: Protocol guidelines were followed. The patient was placed in position over the procedure table.  The target area was identified and the area prepped in the usual manner. Skin & deeper tissues infiltrated with local anesthetic. Appropriate amount of time allowed to pass for local anesthetics to take effect. The procedure needles were then advanced to the target area. Proper needle placement secured. Negative aspiration confirmed. Solution injected in intermittent fashion, asking for systemic symptoms every 0.5cc of injectate. The needles were then removed and the area cleansed, making sure to leave some of the prepping solution back to take advantage of its long term bactericidal properties.  6 cc solution made of 2 cc of 0.2% ropivacaine, 2 cc of preservative-free normal saline, 2 cc of Decadron 10 mg/cc.  3 cc injected for the left L5 nerve, 3 cc injected for the right L5 nerve  Vitals:   10/24/23 0915 10/24/23 0920 10/24/23 0924 10/24/23 0936  BP: 120/64 122/71 121/72 110/80  Pulse: 69 64 62 66  Resp: 13 16 16 14   Temp:      TempSrc:      SpO2: 100% 99% 100% 100%  Weight:      Height:        Start Time: 0913 hrs. End Time:   hrs.  Imaging Guidance (Spinal):          Type of Imaging Technique: Fluoroscopy Guidance (Spinal) Indication(s): Fluoroscopy guidance for needle placement to enhance accuracy in procedures requiring precise needle localization for targeted delivery of medication in or near specific anatomical locations not easily accessible without such real-time imaging assistance. Exposure Time: Please see nurses notes. Contrast: Before injecting any contrast, we confirmed that the patient did not have an allergy to iodine, shellfish, or radiological contrast. Once satisfactory needle placement was completed  at the desired level, radiological contrast was injected. Contrast injected under live fluoroscopy. No contrast complications. See chart for type and volume of contrast used. Fluoroscopic Guidance: I was personally present during the use of fluoroscopy. "Tunnel Vision Technique" used to obtain the best possible view of the target area. Parallax error corrected before commencing the procedure. "Direction-depth-direction" technique used to introduce the needle under continuous pulsed fluoroscopy. Once target was reached, antero-posterior, oblique, and lateral fluoroscopic projection used confirm needle placement in all planes. Images permanently stored in EMR. Interpretation: I personally interpreted the imaging intraoperatively. Adequate needle placement confirmed in multiple planes. Appropriate spread of contrast into desired area was observed. No evidence of afferent or efferent intravascular uptake. No intrathecal or subarachnoid spread observed. Permanent images saved into the patient's record.  Post-operative Assessment:  Post-procedure Vital Signs:  Pulse/HCG Rate: 66  Temp: (!) 97 F (36.1 C) Resp: 14 BP: 110/80 SpO2: 100 %  EBL: None  Complications: No immediate post-treatment complications observed by team, or reported by patient.  Note: The patient tolerated the entire procedure well. A repeat set of vitals were taken after the procedure and the patient was kept under observation following institutional policy, for this type of procedure. Post-procedural neurological assessment was performed, showing return to baseline, prior to discharge. The patient was provided with post-procedure discharge instructions, including a section on how to identify potential problems. Should any problems arise concerning this procedure, the patient was given instructions to immediately contact us, at any time, without hesitation. In any case, we plan to contact the patient by telephone for a follow-up status  report regarding this interventional procedure.  Comments:  No additional relevant information.  Plan of Care (POC)  Orders:  Orders Placed This Encounter  Procedures   [facility] C-ARM 1-60  MIN NO REPORT    Intraoperative interpretation by procedural physician at [facility].    Standing Status:   Standing    Number of Occurrences:   1    Reason for exam::   Assistance in needle guidance and placement for procedures requiring needle placement in or near specific anatomical locations not easily accessible without such assistance.     Medications ordered for procedure: Meds ordered this encounter  Medications   iohexol (OMNIPAQUE) 180 MG/ML injection 10 mL    Must be Myelogram-compatible. If not available, you may substitute with a water-soluble, non-ionic, hypoallergenic, myelogram-compatible radiological contrast medium.   lidocaine (XYLOCAINE) 2 % (with pres) injection 400 mg   lactated ringers infusion   midazolam (VERSED) injection 0.5-2 mg    Make sure Flumazenil is available in the pyxis when using this medication. If oversedation occurs, administer 0.2 mg IV over 15 sec. If after 45 sec no response, administer 0.2 mg again over 1 min; may repeat at 1 min intervals; not to exceed 4 doses (1 mg)   sodium chloride flush (NS) 0.9 % injection 1 mL   ropivacaine (PF) 2 mg/mL (0.2%) (NAROPIN) injection 1 mL   dexamethasone (DECADRON) injection 10 mg   sodium chloride flush (NS) 0.9 % injection 2 mL    This is for a two (2) level block. Use two (2) syringes and divide content in half.   ropivacaine (PF) 2 mg/mL (0.2%) (NAROPIN) injection 2 mL    This is for a two (2) level block. Use two (2) syringes and divide content in half.   dexamethasone (DECADRON) injection 20 mg    This is for a two (2) level block. Use two (2) syringes and divide content in half.   Medications administered: We administered iohexol, lidocaine, lactated ringers, midazolam, sodium chloride  flush, ropivacaine (PF) 2 mg/mL (0.2%), dexamethasone, sodium chloride flush, ropivacaine (PF) 2 mg/mL (0.2%), and dexamethasone.  See the medical record for exact dosing, route, and time of administration.  Follow-up plan:   Return in about 6 weeks (around 12/05/2023) for F2F PPE.       Recent Visits Date Type Provider Dept  09/25/23 Office Visit Edward Jolly, MD [facility]  Showing recent visits within past 90 days and meeting all other requirements Today's Visits Date Type Provider Dept  10/24/23 Procedure visit Edward Jolly, MD [facility]  Showing today's visits and meeting all other requirements Future Appointments Date Type Provider Dept  12/05/23 Appointment Edward Jolly, MD [facility]  Showing future appointments within next 90 days and meeting all other requirements  Disposition: Discharge home  Discharge (Date  Time): 10/24/2023; 0940 hrs.   Primary Care Physician: Sherron Monday, MD Location: [facility] Note by: Edward Jolly, MD (TTS technology used. I apologize for any typographical errors that were not detected and corrected.) Date: 10/24/2023; Time: 11:35 AM  Disclaimer:  Medicine is not an Visual merchandiser. The only guarantee in medicine is that nothing is guaranteed. It is important to note that the decision to proceed with this intervention was based on the information collected from the patient. The Data and conclusions were drawn from the patient's questionnaire, the interview, and the physical examination. Because the information was provided in large part by the patient, it cannot be guaranteed that it has not been purposely or unconsciously manipulated. Every effort has been made to obtain as much relevant data as possible for this evaluation. It is important to note  that the conclusions that lead to this procedure are derived in large part from the available data. Always take into account that the  treatment will also be dependent on availability of resources and existing treatment guidelines, considered by other Pain Management Practitioners as being common knowledge and practice, at the time of the intervention. For Medico-Legal purposes, it is also important to point out that variation in procedural techniques and pharmacological choices are the acceptable norm. The indications, contraindications, technique, and results of the above procedure should only be interpreted and judged by a Board-Certified Interventional Pain Specialist with extensive familiarity and expertise in the same exact procedure and technique.

## 2023-10-24 NOTE — Patient Instructions (Signed)
.  [id number] Pain Management Discharge Instructions  General Discharge Instructions :  If you need to reach your doctor call: Monday-Friday 8:00 am - 4:00 pm at [phone] or toll free [phone].  After clinic hours [phone] to have operator reach doctor.  Bring all of your medication bottles to all your appointments in the pain clinic.  To cancel or reschedule your appointment with Pain Management please remember to call 24 hours in advance to avoid a fee.  Refer to the educational materials which you have been given on: General Risks, I had my Procedure. Discharge Instructions, Post Sedation.  Post Procedure Instructions:  The drugs you were given will stay in your system until tomorrow, so for the next 24 hours you should not drive, make any legal decisions or drink any alcoholic beverages.  You may eat anything you prefer, but it is better to start with liquids then soups and crackers, and gradually work up to solid foods.  Please notify your doctor immediately if you have any unusual bleeding, trouble breathing or pain that is not related to your normal pain.  Depending on the type of procedure that was done, some parts of your body may feel week and/or numb.  This usually clears up by tonight or the next day.  Walk with the use of an assistive device or accompanied by an adult for the 24 hours.  You may use ice on the affected area for the first 24 hours.  Put ice in a Ziploc bag and cover with a towel and place against area 15 minutes on 15 minutes off.  You may switch to heat after 24 hours.

## 2023-10-25 ENCOUNTER — Telehealth: Payer: Self-pay

## 2023-10-25 NOTE — Telephone Encounter (Signed)
Post procedure follow up.  Patient states she is doing good.  

## 2023-11-08 ENCOUNTER — Ambulatory Visit: Payer: Medicaid Other | Admitting: Physical Therapy

## 2023-11-14 ENCOUNTER — Encounter: Payer: Medicaid Other | Admitting: Physical Therapy

## 2023-11-15 ENCOUNTER — Encounter: Payer: Medicaid Other | Admitting: Physical Therapy

## 2023-11-19 ENCOUNTER — Ambulatory Visit: Payer: Medicaid Other | Admitting: Internal Medicine

## 2023-11-19 ENCOUNTER — Other Ambulatory Visit: Payer: Self-pay | Admitting: Internal Medicine

## 2023-11-19 ENCOUNTER — Encounter: Payer: Self-pay | Admitting: Internal Medicine

## 2023-11-19 VITALS — BP 132/78 | HR 72 | Temp 98.0°F | Ht 64.0 in | Wt 159.0 lb

## 2023-11-19 DIAGNOSIS — M5441 Lumbago with sciatica, right side: Secondary | ICD-10-CM

## 2023-11-19 DIAGNOSIS — F32A Depression, unspecified: Secondary | ICD-10-CM

## 2023-11-19 DIAGNOSIS — F419 Anxiety disorder, unspecified: Secondary | ICD-10-CM

## 2023-11-19 DIAGNOSIS — J302 Other seasonal allergic rhinitis: Secondary | ICD-10-CM

## 2023-11-19 DIAGNOSIS — G8929 Other chronic pain: Secondary | ICD-10-CM

## 2023-11-19 DIAGNOSIS — G894 Chronic pain syndrome: Secondary | ICD-10-CM

## 2023-11-19 DIAGNOSIS — K760 Fatty (change of) liver, not elsewhere classified: Secondary | ICD-10-CM

## 2023-11-19 DIAGNOSIS — M5442 Lumbago with sciatica, left side: Secondary | ICD-10-CM

## 2023-11-19 MED ORDER — AZELASTINE HCL 137 MCG/SPRAY NA SOLN: 1.0000 | Freq: Every day | NASAL | 2 refills | Status: DC

## 2023-11-19 MED ORDER — LORAZEPAM 1 MG PO TABS: 1.0000 mg | ORAL_TABLET | Freq: Three times a day (TID) | ORAL | 0 refills | Status: DC | PRN

## 2023-11-19 MED ORDER — OXYCODONE HCL 5 MG PO TABS: 5.0000 mg | ORAL_TABLET | Freq: Two times a day (BID) | ORAL | 0 refills | Status: DC | PRN

## 2023-11-19 NOTE — Progress Notes (Signed)
 Established Patient Office Visit  Subjective:  Patient ID: Nicole Walter, female    DOB: [birth year]  Age: 63 y.o. MRN: [id number]  Chief Complaint  Patient presents with   Pain Management    Here for pain management follow up. Chronic pain well controlled on current analgesia. Last UDS satisfactory and pill counts have also been satisfactory. C/o worsening anxiety and panic attacks.      No other concerns at this time.   Past Medical History:  Diagnosis Date   Abnormal liver enzymes 02/08/2016   Allergy    Anxiety    Chest pain 07/05/2016   Chronic pain    Depression    DJD (degenerative joint disease)    lumbar   Fibromyalgia    High cholesterol    Rash of back 01/28/2016   Weight loss due to medication     Past Surgical History:  Procedure Laterality Date   ANKLE SURGERY     x 2   BREAST BIOPSY Left 1997   CHOLECYSTECTOMY     COLONOSCOPY     COLONOSCOPY WITH PROPOFOL  N/A 02/22/2016   Procedure: COLONOSCOPY WITH PROPOFOL ;  Surgeon: Gladis RAYMOND Mariner, MD;  Location: [facility];  Service: Endoscopy;  Laterality: N/A;   ESOPHAGOGASTRODUODENOSCOPY (EGD) WITH PROPOFOL  N/A 02/22/2016   Procedure: ESOPHAGOGASTRODUODENOSCOPY (EGD) WITH PROPOFOL ;  Surgeon: Gladis RAYMOND Mariner, MD;  Location: [facility] ENDOSCOPY;  Service: Endoscopy;  Laterality: N/A;   NASAL SINUS SURGERY     SHOULDER ARTHROSCOPY WITH ROTATOR CUFF REPAIR AND SUBACROMIAL DECOMPRESSION Left 09/23/2019   Procedure: SHOULDER ARTHROSCOPY WITH MINI ROTATOR CUFF REPAIR AND SUBACROMIAL DECOMPRESSION, BICEPS TENODESIS;  Surgeon: Tobie Priest, MD;  Location: [facility];  Service: Orthopedics;  Laterality: Left;   TONSILLECTOMY     TUBAL LIGATION      Social History   Socioeconomic History   Marital status: Legally Separated    Spouse name: Not on file   Number of children: Not on file   Years of education: Not on file   Highest education level: Not on file  Occupational History   Not on file  Tobacco Use    Smoking status: Every Day    Current packs/day: 0.00    Average packs/day: 1 pack/day for 41.3 years (41.3 ttl pk-yrs)    Types: E-cigarettes, Cigarettes    Start date: 03/06/1974    Last attempt to quit: 07/08/2015    Years since quitting: 8.3   Smokeless tobacco: Never   Tobacco comments:    vapor cigarettes, no nicotene  Vaping Use   Vaping status: Every Day   Start date: 07/08/2015   Substances: Flavoring   Devices: [employer]  Substance and Sexual Activity   Alcohol use: No    Alcohol/week: 0.0 standard drinks of alcohol   Drug use: No   Sexual activity: Not Currently    Birth control/protection: Post-menopausal  Other Topics Concern   Not on file  Social History Narrative   Not on file   Social Drivers of Health   Financial Resource Strain: Not on file  Food Insecurity: Not on file  Transportation Needs: Not on file  Physical Activity: Not on file  Stress: Not on file  Social Connections: Not on file  Intimate Partner Violence: Not on file    Family History  Problem Relation Age of Onset   Heart disease Mother    Stroke Mother    Cancer Father        lung   Arthritis Father  Arthritis Paternal Grandmother    Arthritis Paternal Grandfather    Drug abuse Sister    Drug abuse Brother    Post-traumatic stress disorder Brother    Diabetes Sister    Dementia Sister    Drug abuse Brother    Cancer Brother     Allergies  Allergen Reactions   Other Hives and Other (See Comments)    Pt states she was tested for allergies and peas was one that she is allergic to but has never ate enough of them to see a reaction. Pt states when she is around bird feathers she gets the hives FEATHERS   Adhesive [Tape] Hives   Food     PEAS/ patient does not know what type reaction she has/ from allergy test during childhood   Pea Other (See Comments)    Other reaction(Cloey Sferrazza): Unknown.  Pt states she was tested for allergies and peas was one that she is allergic to but has never  ate enough of them to see a reaction.    Outpatient Medications Prior to Visit  Medication Sig   Azelastine  HCl 137 MCG/SPRAY SOLN Place 1 Inhalation into the nose daily.   Calcium Carb-Cholecalciferol (CALCIUM 600 + D) 600-5 MG-MCG TABS Take by mouth.   cetirizine  (ZYRTEC ) 10 MG tablet TAKE 1 TABLET BY MOUTH ONCE DAILY IN THE MORNING   Cholecalciferol (VITAMIN D3) 125 MCG (5000 UT) CAPS Take 1 capsule (5,000 Units total) by mouth daily.   fexofenadine (ALLEGRA) 180 MG tablet Take 180 mg by mouth daily.   ibuprofen  (ADVIL ) 800 MG tablet Take 1 tablet (800 mg total) by mouth every 8 (eight) hours as needed.   LINZESS  72 MCG capsule TK 1 C PO QD   magnesium hydroxide (MILK OF MAGNESIA) 400 MG/5ML suspension Take 30 mLs by mouth daily as needed for mild constipation.   Multiple Vitamin (MULTI-VITAMINS) TABS Take 1 tablet by mouth daily.   tiZANidine  (ZANAFLEX ) 4 MG tablet Take 1 tablet (4 mg total) by mouth 2 (two) times daily.   No facility-administered medications prior to visit.    Review of Systems  Constitutional: Negative.   HENT: Negative.    Eyes: Negative.   Cardiovascular: Negative.   Gastrointestinal: Negative.   Genitourinary: Negative.        Urge incontinence  Musculoskeletal:  Positive for back pain, joint pain and neck pain.       As in hpi  Skin: Negative.   Neurological: Negative.   Endo/Heme/Allergies: Negative.        Objective:   BP 132/78   Pulse 72   Temp 98 F (36.7 C)   Ht 5' 4 (1.626 m)   Wt 159 lb (72.1 kg)   SpO2 98%   BMI 27.29 kg/m   Vitals:   11/19/23 0944  BP: 132/78  Pulse: 72  Temp: 98 F (36.7 C)  Height: 5' 4 (1.626 m)  Weight: 159 lb (72.1 kg)  SpO2: 98%  BMI (Calculated): 27.28    Physical Exam Vitals reviewed.  Constitutional:      General: She is not in acute distress. HENT:     Head: Normocephalic.     Nose: Nose normal.     Mouth/Throat:     Mouth: Mucous membranes are moist.  Eyes:     Extraocular  Movements: Extraocular movements intact.     Pupils: Pupils are equal, round, and reactive to light.  Cardiovascular:     Rate and Rhythm: Normal rate and regular rhythm.  Heart sounds: No murmur heard. Pulmonary:     Effort: Pulmonary effort is normal.     Breath sounds: No rhonchi or rales.  Abdominal:     General: Abdomen is flat.     Palpations: There is no hepatomegaly, splenomegaly or mass.  Musculoskeletal:        General: Normal range of motion.     Cervical back: Normal range of motion. No tenderness.  Skin:    General: Skin is warm and dry.  Neurological:     General: No focal deficit present.     Mental Status: She is alert and oriented to person, place, and time.     Cranial Nerves: No cranial nerve deficit.     Motor: No weakness.  Psychiatric:        Mood and Affect: Mood normal.        Behavior: Behavior normal.      No results found for any visits on 11/19/23.  No results found for this or any previous visit (from the past 2160 hours).    Assessment & Plan:  As per problem list. Reduce oxycodone  frequency and restart benzodiazepine prn for anxiety.  Problem List Items Addressed This Visit       Nervous and Auditory   Chronic low back pain (Bilateral) (L>R) - Primary (Chronic)     Other   Chronic pain syndrome (Chronic)   Anxiety and depression   Other Visit Diagnoses       Seasonal allergic rhinitis, unspecified trigger           Return in about 1 month (around 12/20/2023).   Total time spent: 20 minutes  Sherrill Cinderella Perry, MD  11/19/2023   This document may have been prepared by [facility] Voice Recognition software and as such may include unintentional dictation errors.

## 2023-12-05 ENCOUNTER — Encounter: Payer: Self-pay | Admitting: Student in an Organized Health Care Education/Training Program

## 2023-12-05 ENCOUNTER — Ambulatory Visit
Payer: Medicaid Other | Attending: Student in an Organized Health Care Education/Training Program | Admitting: Student in an Organized Health Care Education/Training Program

## 2023-12-05 VITALS — BP 121/81 | Temp 96.6°F | Resp 16 | Ht 64.0 in | Wt 166.0 lb

## 2023-12-05 DIAGNOSIS — M5412 Radiculopathy, cervical region: Secondary | ICD-10-CM | POA: Insufficient documentation

## 2023-12-05 DIAGNOSIS — M542 Cervicalgia: Secondary | ICD-10-CM | POA: Insufficient documentation

## 2023-12-05 DIAGNOSIS — G8929 Other chronic pain: Secondary | ICD-10-CM | POA: Diagnosis present

## 2023-12-05 DIAGNOSIS — G894 Chronic pain syndrome: Secondary | ICD-10-CM | POA: Diagnosis present

## 2023-12-05 DIAGNOSIS — M5416 Radiculopathy, lumbar region: Secondary | ICD-10-CM | POA: Diagnosis present

## 2023-12-05 NOTE — Progress Notes (Signed)
PROVIDER NOTE: Information contained herein reflects review and annotations entered in association with encounter. Interpretation of such information and data should be left to medically-trained personnel. Information provided to patient can be located elsewhere in the medical record under "Patient Instructions". Document created using STT-dictation technology, any transcriptional errors that may result from process are unintentional.    Patient: Nicole Walter  Service Category: E/M  Provider: Edward Jolly, MD  DOB: 09/22/1961  DOS: 12/05/2023  Referring Provider: Sherron Monday, MD  MRN: [id number]  Specialty: Interventional Pain Management  PCP: Sherron Monday, MD  Type: Established Patient  Setting: Ambulatory outpatient    Location: Office  Delivery: Face-to-face     HPI  [name], a 63 y.o. year old female, is here today because of her Cervicalgia [M54.2]. [name] primary complain today is Neck Pain (right)   Pain Assessment: Severity of Chronic pain is reported as a 4 /10. Location: Neck Right/denies. Onset: More than a month ago. Quality: Other (Comment) (pulling). Timing: Constant. Modifying factor(s): ice. Vitals:  height is 5\' 4"  (1.626 m) and weight is 166 lb (75.3 kg). Her temporal temperature is 96.6 F (35.9 C) (abnormal). Her blood pressure is 121/81. Her respiration is 16 and oxygen saturation is 97%.  BMI: Estimated body mass index is 28.49 kg/m as calculated from the following:   Height as of this encounter: 5\' 4"  (1.626 m).   Weight as of this encounter: 166 lb (75.3 kg). Last encounter: 09/25/2023. Last procedure: 10/24/2023.  Reason for encounter: post-procedure evaluation and assessment.    Post-procedure evaluation    Procedure: Cervical Epidural Steroid injection (CESI) (Interlaminar) #1  Laterality: Right  Level: C7-T1 Imaging: Fluoroscopy-assisted DOS: 10/24/2023  Performed by: Edward Jolly, MD Anesthesia: Local anesthesia (1-2%  Lidocaine) Sedation: Minimal Sedation                         Purpose: Diagnostic/Therapeutic Indications: Cervicalgia, cervical radicular pain, degenerative disc disease, severe enough to impact quality of life or function. Cervical Radicular Pain  NAS-11 score:   Pre-procedure: 5 /10   Post-procedure:  (neck-0, lower back-1)/10     Effectiveness:  Initial hour after procedure: 100 %  Subsequent 4-6 hours post-procedure: 100 % Analgesia past initial 6 hours: 50 %  Ongoing improvement:  Analgesic:  50% Function: Transient improvement ROM: [name] reports improvement in ROM   Discussed the use of AI scribe software for clinical note transcription with the patient, who gave verbal consent to proceed.  History of Present Illness   The patient presents with persistent neck and hip pain following a cervical epidural injection.  She experiences persistent neck pain characterized by a constant burning sensation in the trapezius muscle area since receiving a cervical epidural injection over a month ago. This burning is described as a 'muscle burn.'  She also has a pulling sensation towards her right hip. Despite these symptoms, she notes improvement in her lower back pain and increased range of motion in her neck, which had previously restricted her ability to turn her head while driving.  She recalls experiencing significant muscle spasms in the past, which have since improved, although she still feels tightness in certain areas.  She mentions having visited a chiropractor, which helped with her neck mobility.        ROS  Constitutional: Denies any fever or chills Gastrointestinal: No reported hemesis, hematochezia, vomiting, or acute GI distress Musculoskeletal:  TP in right trapezeius and left lumbar  region Neurological: No reported episodes of acute onset apraxia, aphasia, dysarthria, agnosia, amnesia, paralysis, loss of coordination, or loss of consciousness  Medication  Review  Azelastine HCl, Calcium Carb-Cholecalciferol, LORazepam, Multi-Vitamins, Vitamin D3, cetirizine, fexofenadine, ibuprofen, linaclotide, magnesium hydroxide, oxyCODONE, and tiZANidine  History Review  Allergy: [name] is allergic to other, adhesive [tape], food, and pea. Drug: [name]  reports no history of drug use. Alcohol:  reports no history of alcohol use. Tobacco:  reports that she has been smoking e-cigarettes and cigarettes. She started smoking about 49 years ago. She has a 41.3 pack-year smoking history. She has never used smokeless tobacco. Social: [name]  reports that she has been smoking e-cigarettes and cigarettes. She started smoking about 49 years ago. She has a 41.3 pack-year smoking history. She has never used smokeless tobacco. She reports that she does not drink alcohol and does not use drugs. Medical:  has a past medical history of Abnormal liver enzymes (02/08/2016), Allergy, Anxiety, Chest pain (07/05/2016), Chronic pain, Depression, DJD (degenerative joint disease), Fibromyalgia, High cholesterol, Rash of back (01/28/2016), and Weight loss due to medication. Surgical: [name]  has a past surgical history that includes Cholecystectomy; Tubal ligation; Ankle surgery; Nasal sinus surgery; Tonsillectomy; Colonoscopy; Esophagogastroduodenoscopy (egd) with propofol (N/A, 02/22/2016); Colonoscopy with propofol (N/A, 02/22/2016); Breast biopsy (Left, 1997); and Shoulder arthroscopy with rotator cuff repair and subacromial decompression (Left, 09/23/2019). Family: family history includes Arthritis in her father, paternal grandfather, and paternal grandmother; Cancer in her brother and father; Dementia in her sister; Diabetes in her sister; Drug abuse in her brother, brother, and sister; Heart disease in her mother; Post-traumatic stress disorder in her brother; Stroke in her mother.  Laboratory Chemistry Profile   Renal Lab Results  Component Value Date   BUN 12 05/11/2023    CREATININE 0.72 05/11/2023   BCR 22 12/25/2022   GFR 86.13 10/08/2018   GFRAA >60 12/07/2018   GFRNONAA >60 05/11/2023    Hepatic Lab Results  Component Value Date   AST 17 12/25/2022   ALT 24 12/25/2022   ALBUMIN 4.7 12/25/2022   ALKPHOS 83 12/25/2022   HCVAB NEGATIVE 02/14/2017   LIPASE 29 12/07/2018    Electrolytes Lab Results  Component Value Date   NA 137 05/11/2023   K 3.9 05/11/2023   CL 104 05/11/2023   CALCIUM 9.5 05/11/2023   MG 2.2 05/15/2017    Bone Lab Results  Component Value Date   VD25OH 51.75 10/11/2018   25OHVITD1 58 07/20/2016   25OHVITD2 <1.0 07/20/2016   25OHVITD3 58 07/20/2016    Inflammation (CRP: Acute Phase) (ESR: Chronic Phase) Lab Results  Component Value Date   CRP 2.1 (H) 07/20/2016   ESRSEDRATE 2 05/15/2017         Note: Above Lab results reviewed.   Physical Exam  General appearance: Well nourished, well developed, and well hydrated. In no apparent acute distress Mental status: Alert, oriented x 3 (person, place, & time)       Respiratory: No evidence of acute respiratory distress Eyes: PERLA Vitals: BP 121/81   Temp (!) 96.6 F (35.9 C) (Temporal)   Resp 16   Ht 5\' 4"  (1.626 m)   Wt 166 lb (75.3 kg)   SpO2 97%   BMI 28.49 kg/m  BMI: Estimated body mass index is 28.49 kg/m as calculated from the following:   Height as of this encounter: 5\' 4"  (1.626 m).   Weight as of this encounter: 166 lb (75.3 kg). Ideal: Ideal body  weight: 54.7 kg (120 lb 9.5 oz) Adjusted ideal body weight: 62.9 kg (138 lb 12.1 oz)  Improvement in cervical ROM TP at right trapezius TP also present at right lumbar spine  Assessment   Diagnosis Status  1. Cervicalgia   2. Cervical radicular pain   3. Chronic radicular lumbar pain   4. Chronic pain syndrome    Controlled Controlled Controlled   Updated Problems: No problems updated.  Plan of Care  Problem-specific:  Assessment and Plan    Cervical Radiculopathy There is improvement  in lower back pain and neck range of motion after a cervical epidural injection over a month ago. A constant burning sensation in the trapezius muscle and pulling towards the right hip persists, likely muscular in nature. Potential relief from trigger point injections, which are superficial injections into the muscle bed, was discussed. Monitor symptoms and consider trigger point injection if symptoms persist. Contact the clinic if symptoms worsen or for further intervention.  Muscle Spasms Tightness in the trapezius muscle is due to asymmetrical muscle contractions. Stretching exercises are preferred before considering further interventions. Encourage stretching exercises and place an order for a trigger point injection, with the option to proceed if desired.       [name] has a current medication list which includes the following long-term medication(s): azelastine hcl, calcium carb-cholecalciferol, cetirizine, and linzess.  Pharmacotherapy (Medications Ordered): No orders of the defined types were placed in this encounter.  Orders:  Orders Placed This Encounter  Procedures   TRIGGER POINT INJECTION    Standing Status:   Standing    Number of Occurrences:   2    Next Expected Occurrence:   02/22/2024    Expiration Date:   06/03/2024    Scheduling Instructions:     Right trapezius and right lumbar spinal    Where will this procedure be performed?:   [facility]   Follow-up plan:   Return for patient will call to schedule F2F appt prn.     Recent Visits Date Type Provider Dept  10/24/23 Procedure visit Edward Jolly, MD [facility]  09/25/23 Office Visit Edward Jolly, MD [facility]  Showing recent visits within past 90 days and meeting all other requirements Today's Visits Date Type Provider Dept  12/05/23 Office Visit Edward Jolly, MD [facility]  Showing today's visits and meeting all other requirements Future Appointments No  visits were found meeting these conditions. Showing future appointments within next 90 days and meeting all other requirements  I discussed the assessment and treatment plan with the patient. The patient was provided an opportunity to ask questions and all were answered. The patient agreed with the plan and demonstrated an understanding of the instructions.  Patient advised to call back or seek an in-person evaluation if the symptoms or condition worsens.  Duration of encounter: .  Total time on encounter, as per AMA guidelines included both the face-to-face and non-face-to-face time personally spent by the physician and/or other qualified health care professional(s) on the day of the encounter (includes time in activities that require the physician or other qualified health care professional and does not include time in activities normally performed by clinical staff). Physician's time may include the following activities when performed: Preparing to see the patient (e.g., pre-charting review of records, searching for previously ordered imaging, lab work, and nerve conduction tests) Review of prior analgesic pharmacotherapies. Reviewing PMP Interpreting ordered tests (e.g., lab work, imaging, nerve conduction tests) Performing post-procedure evaluations,  including interpretation of diagnostic procedures Obtaining and/or reviewing separately obtained history Performing a medically appropriate examination and/or evaluation Counseling and educating the patient/family/caregiver Ordering medications, tests, or procedures Referring and communicating with other health care professionals (when not separately reported) Documenting clinical information in the electronic or other health record Independently interpreting results (not separately reported) and communicating results to the patient/ family/caregiver Care coordination (not separately reported)  Note by: Edward Jolly, MD Date:  12/05/2023; Time: 2:37 PM

## 2023-12-05 NOTE — Patient Instructions (Signed)

## 2023-12-11 ENCOUNTER — Ambulatory Visit: Payer: Medicaid Other

## 2023-12-11 DIAGNOSIS — Z720 Tobacco use: Secondary | ICD-10-CM | POA: Diagnosis not present

## 2023-12-11 DIAGNOSIS — F17219 Nicotine dependence, cigarettes, with unspecified nicotine-induced disorders: Secondary | ICD-10-CM | POA: Diagnosis not present

## 2023-12-17 ENCOUNTER — Encounter: Payer: Self-pay | Admitting: Internal Medicine

## 2023-12-18 ENCOUNTER — Ambulatory Visit (INDEPENDENT_AMBULATORY_CARE_PROVIDER_SITE_OTHER): Payer: Medicaid Other | Admitting: Internal Medicine

## 2023-12-18 ENCOUNTER — Other Ambulatory Visit: Payer: Medicaid Other

## 2023-12-18 VITALS — BP 130/70 | HR 77 | Temp 98.0°F | Ht 64.0 in | Wt 160.0 lb

## 2023-12-18 DIAGNOSIS — M5441 Lumbago with sciatica, right side: Secondary | ICD-10-CM

## 2023-12-18 DIAGNOSIS — J302 Other seasonal allergic rhinitis: Secondary | ICD-10-CM

## 2023-12-18 DIAGNOSIS — E559 Vitamin D deficiency, unspecified: Secondary | ICD-10-CM

## 2023-12-18 DIAGNOSIS — K5900 Constipation, unspecified: Secondary | ICD-10-CM | POA: Diagnosis not present

## 2023-12-18 DIAGNOSIS — F32A Depression, unspecified: Secondary | ICD-10-CM

## 2023-12-18 DIAGNOSIS — M5442 Lumbago with sciatica, left side: Secondary | ICD-10-CM | POA: Diagnosis not present

## 2023-12-18 DIAGNOSIS — K760 Fatty (change of) liver, not elsewhere classified: Secondary | ICD-10-CM | POA: Diagnosis not present

## 2023-12-18 DIAGNOSIS — G8929 Other chronic pain: Secondary | ICD-10-CM

## 2023-12-18 DIAGNOSIS — F419 Anxiety disorder, unspecified: Secondary | ICD-10-CM | POA: Diagnosis not present

## 2023-12-18 DIAGNOSIS — I10 Essential (primary) hypertension: Secondary | ICD-10-CM

## 2023-12-18 DIAGNOSIS — G894 Chronic pain syndrome: Secondary | ICD-10-CM

## 2023-12-18 MED ORDER — CETIRIZINE HCL 10 MG PO TABS
10.0000 mg | ORAL_TABLET | Freq: Every morning | ORAL | 5 refills | Status: DC
Start: 2023-12-18 — End: 2024-05-16

## 2023-12-18 MED ORDER — LORAZEPAM 1 MG PO TABS: 1.0000 mg | ORAL_TABLET | Freq: Three times a day (TID) | ORAL | 1 refills | Status: DC | PRN

## 2023-12-18 MED ORDER — LINZESS 72 MCG PO CAPS: ORAL_CAPSULE | ORAL | 3 refills | Status: DC

## 2023-12-18 MED ORDER — IBUPROFEN 800 MG PO TABS
800.0000 mg | ORAL_TABLET | Freq: Three times a day (TID) | ORAL | 1 refills | Status: DC | PRN
Start: 2023-12-18 — End: 2024-02-14

## 2023-12-18 MED ORDER — OXYCODONE HCL 5 MG PO TABS: 5.0000 mg | ORAL_TABLET | Freq: Two times a day (BID) | ORAL | 0 refills | Status: DC | PRN

## 2023-12-18 NOTE — Progress Notes (Signed)
Established Patient Office Visit  Subjective:  Patient ID: Nicole Walter, female    DOB: [birth year]  Age: 63 y.o. MRN: [id number]  Chief Complaint  Patient presents with   Pain Management    1 Month PM    Here for pain management follow up. Chronic pain well controlled on current analgesia. Last UDS satisfactory and pill counts have also been satisfactory. Anxiety has improved with resolution of panic attacks.    No other concerns at this time.   Past Medical History:  Diagnosis Date   Abnormal liver enzymes 02/08/2016   Allergy    Anxiety    Chest pain 07/05/2016   Chronic pain    Depression    DJD (degenerative joint disease)    lumbar   Fibromyalgia    High cholesterol    Rash of back 01/28/2016   Weight loss due to medication     Past Surgical History:  Procedure Laterality Date   ANKLE SURGERY     x 2   BREAST BIOPSY Left 1997   CHOLECYSTECTOMY     COLONOSCOPY     COLONOSCOPY WITH PROPOFOL N/A 02/22/2016   Procedure: COLONOSCOPY WITH PROPOFOL;  Surgeon: Christena Deem, MD;  Location: [facility] ENDOSCOPY;  Service: Endoscopy;  Laterality: N/A;   ESOPHAGOGASTRODUODENOSCOPY (EGD) WITH PROPOFOL N/A 02/22/2016   Procedure: ESOPHAGOGASTRODUODENOSCOPY (EGD) WITH PROPOFOL;  Surgeon: Christena Deem, MD;  Location: [facility] ENDOSCOPY;  Service: Endoscopy;  Laterality: N/A;   NASAL SINUS SURGERY     SHOULDER ARTHROSCOPY WITH ROTATOR CUFF REPAIR AND SUBACROMIAL DECOMPRESSION Left 09/23/2019   Procedure: SHOULDER ARTHROSCOPY WITH MINI ROTATOR CUFF REPAIR AND SUBACROMIAL DECOMPRESSION, BICEPS TENODESIS;  Surgeon: Signa Kell, MD;  Location: [facility];  Service: Orthopedics;  Laterality: Left;   TONSILLECTOMY     TUBAL LIGATION      Social History   Socioeconomic History   Marital status: Legally Separated    Spouse name: Not on file   Number of children: Not on file   Years of education: Not on file   Highest education level: Not on file  Occupational History    Not on file  Tobacco Use   Smoking status: Every Day    Current packs/day: 0.00    Average packs/day: 1 pack/day for 41.3 years (41.3 ttl pk-yrs)    Types: E-cigarettes, Cigarettes    Start date: 03/06/1974    Last attempt to quit: 07/08/2015    Years since quitting: 8.4   Smokeless tobacco: Never   Tobacco comments:    vapor cigarettes, no nicotene  Vaping Use   Vaping status: Every Day   Start date: 07/08/2015   Substances: Flavoring   Devices: Nort, Geek  Substance and Sexual Activity   Alcohol use: No    Alcohol/week: 0.0 standard drinks of alcohol   Drug use: No   Sexual activity: Not Currently    Birth control/protection: Post-menopausal  Other Topics Concern   Not on file  Social History Narrative   Not on file   Social Drivers of Health   Financial Resource Strain: Not on file  Food Insecurity: Not on file  Transportation Needs: Not on file  Physical Activity: Not on file  Stress: Not on file  Social Connections: Not on file  Intimate Partner Violence: Not on file    Family History  Problem Relation Age of Onset   Heart disease Mother    Stroke Mother    Cancer Father        lung  Arthritis Father    Arthritis Paternal Grandmother    Arthritis Paternal Grandfather    Drug abuse Sister    Drug abuse Brother    Post-traumatic stress disorder Brother    Diabetes Sister    Dementia Sister    Drug abuse Brother    Cancer Brother     Allergies  Allergen Reactions   Other Hives and Other (See Comments)    Pt states she was tested for allergies and peas was one that she is allergic to but has never ate enough of them to see a reaction. Pt states when she is around bird feathers she gets the hives FEATHERS   Adhesive [Tape] Hives   Food     PEAS/ patient does not know what type reaction she has/ from allergy test during childhood   Pea Other (See Comments)    Other reaction(Hannahgrace Lalli): Unknown.  Pt states she was tested for allergies and peas was one that she  is allergic to but has never ate enough of them to see a reaction.    Outpatient Medications Prior to Visit  Medication Sig   Azelastine HCl 137 MCG/SPRAY SOLN Place 1 Inhalation into the nose daily.   Calcium Carb-Cholecalciferol (CALCIUM 600 + D) 600-5 MG-MCG TABS Take by mouth.   Cholecalciferol (VITAMIN D3) 125 MCG (5000 UT) CAPS Take 1 capsule (5,000 Units total) by mouth daily.   magnesium hydroxide (MILK OF MAGNESIA) 400 MG/5ML suspension Take 30 mLs by mouth daily as needed for mild constipation.   Multiple Vitamin (MULTI-VITAMINS) TABS Take 1 tablet by mouth daily.   tiZANidine (ZANAFLEX) 4 MG tablet Take 1 tablet (4 mg total) by mouth 2 (two) times daily.   [DISCONTINUED] cetirizine (ZYRTEC) 10 MG tablet TAKE 1 TABLET BY MOUTH ONCE DAILY IN THE MORNING   [DISCONTINUED] fexofenadine (ALLEGRA) 180 MG tablet Take 180 mg by mouth daily.   [DISCONTINUED] ibuprofen (ADVIL) 800 MG tablet Take 1 tablet (800 mg total) by mouth every 8 (eight) hours as needed.   [DISCONTINUED] LINZESS 72 MCG capsule TK 1 C PO QD   [DISCONTINUED] LORazepam (ATIVAN) 1 MG tablet Take 1 tablet (1 mg total) by mouth every 8 (eight) hours as needed for anxiety.   [DISCONTINUED] oxyCODONE (OXY IR/ROXICODONE) 5 MG immediate release tablet Take 1 tablet (5 mg total) by mouth 2 (two) times daily as needed for severe pain (pain score 7-10). Prescribed by Sherron Monday, MD   No facility-administered medications prior to visit.    Review of Systems  Constitutional: Negative.   HENT: Negative.    Eyes: Negative.   Cardiovascular: Negative.   Gastrointestinal: Negative.   Genitourinary: Negative.        Urge incontinence  Musculoskeletal:  Positive for back pain, joint pain and neck pain.       As in hpi  Skin: Negative.   Neurological: Negative.   Endo/Heme/Allergies: Negative.   Psychiatric/Behavioral:  The patient is nervous/anxious.        Objective:   BP 130/70   Pulse 77   Temp 98 F (36.7 C)  (Tympanic)   Ht 5\' 4"  (1.626 m)   Wt 160 lb (72.6 kg)   SpO2 97%   BMI 27.46 kg/m   Vitals:   12/18/23 1108  BP: 130/70  Pulse: 77  Temp: 98 F (36.7 C)  Height: 5\' 4"  (1.626 m)  Weight: 160 lb (72.6 kg)  SpO2: 97%  TempSrc: Tympanic  BMI (Calculated): 27.45    Physical Exam Vitals  reviewed.  Constitutional:      General: She is not in acute distress. HENT:     Head: Normocephalic.     Nose: Nose normal.     Mouth/Throat:     Mouth: Mucous membranes are moist.  Eyes:     Extraocular Movements: Extraocular movements intact.     Pupils: Pupils are equal, round, and reactive to light.  Cardiovascular:     Rate and Rhythm: Normal rate and regular rhythm.     Heart sounds: No murmur heard. Pulmonary:     Effort: Pulmonary effort is normal.     Breath sounds: No rhonchi or rales.  Abdominal:     General: Abdomen is flat.     Palpations: There is no hepatomegaly, splenomegaly or mass.  Musculoskeletal:        General: Normal range of motion.     Cervical back: Normal range of motion. No tenderness.  Skin:    General: Skin is warm and dry.  Neurological:     General: No focal deficit present.     Mental Status: She is alert and oriented to person, place, and time.     Cranial Nerves: No cranial nerve deficit.     Motor: No weakness.  Psychiatric:        Mood and Affect: Mood normal.        Behavior: Behavior normal.      No results found for any visits on 12/18/23.  No results found for this or any previous visit (from the past 2160 hours).    Assessment & Plan:  As per problem list. Problem List Items Addressed This Visit       Digestive   Fatty liver - Primary   Relevant Orders   Lipid panel   Comprehensive metabolic panel   CBC With Diff/Platelet     Nervous and Auditory   Chronic low back pain (Bilateral) (L>R) (Chronic)   Relevant Medications   oxyCODONE (OXY IR/ROXICODONE) 5 MG immediate release tablet   LORazepam (ATIVAN) 1 MG tablet    ibuprofen (ADVIL) 800 MG tablet   Other Relevant Orders   Drug Screen 13 with reflex Confirmation (AMP,BAR,BZO,COC,PCP,THC,OPI,OXY,MD,FEN,MEP,PPX,TRAM), Serum     Other   Chronic pain syndrome (Chronic)   Relevant Medications   oxyCODONE (OXY IR/ROXICODONE) 5 MG immediate release tablet   ibuprofen (ADVIL) 800 MG tablet   Anxiety and depression   Relevant Medications   LORazepam (ATIVAN) 1 MG tablet   Constipation   Relevant Medications   LINZESS 72 MCG capsule   Other Visit Diagnoses       Seasonal allergic rhinitis, unspecified trigger       Relevant Medications   cetirizine (ZYRTEC) 10 MG tablet     Vitamin D deficiency       Relevant Orders   Vitamin D (25 hydroxy)       Return in about 2 months (around 02/15/2024).   Total time spent: 20 minutes  Luna Fuse, MD  12/18/2023   This document may have been prepared by [facility] Voice Recognition software and as such may include unintentional dictation errors.

## 2023-12-19 LAB — CBC WITH DIFF/PLATELET
Basophils Absolute: 0.1 10*3/uL (ref 0.0–0.2)
Basos: 1 %
EOS (ABSOLUTE): 0.3 10*3/uL (ref 0.0–0.4)
Eos: 4 %
Hematocrit: 42.8 % (ref 34.0–46.6)
Hemoglobin: 14.2 g/dL (ref 11.1–15.9)
Immature Grans (Abs): 0 10*3/uL (ref 0.0–0.1)
Immature Granulocytes: 0 %
Lymphocytes Absolute: 1.7 10*3/uL (ref 0.7–3.1)
Lymphs: 24 %
MCH: 29.7 pg (ref 26.6–33.0)
MCHC: 33.2 g/dL (ref 31.5–35.7)
MCV: 90 fL (ref 79–97)
Monocytes Absolute: 0.5 10*3/uL (ref 0.1–0.9)
Monocytes: 7 %
Neutrophils Absolute: 4.5 10*3/uL (ref 1.4–7.0)
Neutrophils: 64 %
Platelets: 330 10*3/uL (ref 150–450)
RBC: 4.78 x10E6/uL (ref 3.77–5.28)
RDW: 13.1 % (ref 11.7–15.4)
WBC: 7.1 10*3/uL (ref 3.4–10.8)

## 2023-12-19 LAB — COMPREHENSIVE METABOLIC PANEL
ALT: 35 [IU]/L — ABNORMAL HIGH (ref 0–32)
AST: 33 [IU]/L (ref 0–40)
Albumin: 4.8 g/dL (ref 3.9–4.9)
Alkaline Phosphatase: 89 [IU]/L (ref 44–121)
BUN/Creatinine Ratio: 20 (ref 12–28)
BUN: 17 mg/dL (ref 8–27)
Bilirubin Total: 0.6 mg/dL (ref 0.0–1.2)
CO2: 24 mmol/L (ref 20–29)
Calcium: 9.7 mg/dL (ref 8.7–10.3)
Chloride: 105 mmol/L (ref 96–106)
Creatinine, Ser: 0.85 mg/dL (ref 0.57–1.00)
Globulin, Total: 2.3 g/dL (ref 1.5–4.5)
Glucose: 104 mg/dL — ABNORMAL HIGH (ref 70–99)
Potassium: 4.4 mmol/L (ref 3.5–5.2)
Sodium: 143 mmol/L (ref 134–144)
Total Protein: 7.1 g/dL (ref 6.0–8.5)
eGFR: 78 mL/min/{1.73_m2} (ref >59)

## 2023-12-19 LAB — LIPID PANEL
Chol/HDL Ratio: 3.9 {ratio} (ref 0.0–4.4)
Cholesterol, Total: 255 mg/dL — ABNORMAL HIGH (ref 100–199)
HDL: 65 mg/dL (ref >39)
LDL Chol Calc (NIH): 167 mg/dL — ABNORMAL HIGH (ref 0–99)
Triglycerides: 131 mg/dL (ref 0–149)
VLDL Cholesterol Cal: 23 mg/dL (ref 5–40)

## 2023-12-19 LAB — VITAMIN D 25 HYDROXY (VIT D DEFICIENCY, FRACTURES): Vit D, 25-Hydroxy: 49 ng/mL (ref 30.0–100.0)

## 2023-12-20 LAB — DRUG SCREEN 13 W/CONF , SERUM
Amphetamines, IA: NEGATIVE ng/mL
Barbiturates, IA: NEGATIVE ug/mL
Benzodiazepines, IA: NEGATIVE ng/mL
Cocaine & Metabolite, IA: NEGATIVE ng/mL
FENTANYL, IA: NEGATIVE ng/mL
MEPERIDINE, IA: NEGATIVE ng/mL
Methadone, IA: NEGATIVE ng/mL
Opiates, IA: NEGATIVE ng/mL
Oxycodones, IA: NEGATIVE ng/mL
Phencyclidine, IA: NEGATIVE ng/mL
Propoxyphene, IA: NEGATIVE ng/mL
THC(Marijuana) Metabolite, IA: NEGATIVE ng/mL
TRAMADOL, IA: NEGATIVE ng/mL

## 2023-12-26 ENCOUNTER — Ambulatory Visit (INDEPENDENT_AMBULATORY_CARE_PROVIDER_SITE_OTHER): Payer: Medicaid Other | Admitting: Internal Medicine

## 2023-12-26 ENCOUNTER — Encounter: Payer: Self-pay | Admitting: Internal Medicine

## 2023-12-26 VITALS — BP 122/71 | HR 73 | Temp 98.3°F | Ht 64.0 in | Wt 160.0 lb

## 2023-12-26 DIAGNOSIS — Z1331 Encounter for screening for depression: Secondary | ICD-10-CM

## 2023-12-26 DIAGNOSIS — Z0130 Encounter for examination of blood pressure without abnormal findings: Secondary | ICD-10-CM

## 2023-12-26 DIAGNOSIS — Z0000 Encounter for general adult medical examination without abnormal findings: Secondary | ICD-10-CM

## 2023-12-26 DIAGNOSIS — G894 Chronic pain syndrome: Secondary | ICD-10-CM

## 2023-12-26 DIAGNOSIS — E782 Mixed hyperlipidemia: Secondary | ICD-10-CM

## 2023-12-26 DIAGNOSIS — F419 Anxiety disorder, unspecified: Secondary | ICD-10-CM | POA: Diagnosis not present

## 2023-12-26 DIAGNOSIS — F32A Depression, unspecified: Secondary | ICD-10-CM | POA: Diagnosis not present

## 2023-12-26 MED ORDER — NEXLIZET 180-10 MG PO TABS: 1.0000 | ORAL_TABLET | Freq: Every day | ORAL | 2 refills | Status: DC

## 2023-12-26 NOTE — Progress Notes (Signed)
Established Patient Office Visit  Subjective:  Patient ID: Nicole Walter, female    DOB: 03-14-1961  Age: 63 y.o. MRN: [id number]  Chief Complaint  Patient presents with   Annual Exam    CPE with no PAP    No new complaints, here for CPE, lab review and medication refills. Labs reviewed and notable for deterioration in lipids and elevated ALT. CBC unremarkable with normal Vit D level.     No other concerns at this time.   Past Medical History:  Diagnosis Date   Abnormal liver enzymes 02/08/2016   Allergy    Anxiety    Chest pain 07/05/2016   Chronic pain    Depression    DJD (degenerative joint disease)    lumbar   Fibromyalgia    High cholesterol    Rash of back 01/28/2016   Weight loss due to medication     Past Surgical History:  Procedure Laterality Date   ANKLE SURGERY     x 2   BREAST BIOPSY Left 1997   CHOLECYSTECTOMY     COLONOSCOPY     COLONOSCOPY WITH PROPOFOL N/A 02/22/2016   Procedure: COLONOSCOPY WITH PROPOFOL;  Surgeon: Christena Deem, MD;  Location: [facility] ENDOSCOPY;  Service: Endoscopy;  Laterality: N/A;   ESOPHAGOGASTRODUODENOSCOPY (EGD) WITH PROPOFOL N/A 02/22/2016   Procedure: ESOPHAGOGASTRODUODENOSCOPY (EGD) WITH PROPOFOL;  Surgeon: Christena Deem, MD;  Location: [facility] ENDOSCOPY;  Service: Endoscopy;  Laterality: N/A;   NASAL SINUS SURGERY     SHOULDER ARTHROSCOPY WITH ROTATOR CUFF REPAIR AND SUBACROMIAL DECOMPRESSION Left 09/23/2019   Procedure: SHOULDER ARTHROSCOPY WITH MINI ROTATOR CUFF REPAIR AND SUBACROMIAL DECOMPRESSION, BICEPS TENODESIS;  Surgeon: Signa Kell, MD;  Location: [facility];  Service: Orthopedics;  Laterality: Left;   TONSILLECTOMY     TUBAL LIGATION      Social History   Socioeconomic History   Marital status: Legally Separated    Spouse name: Not on file   Number of children: Not on file   Years of education: Not on file   Highest education level: Not on file  Occupational History   Not on file  Tobacco  Use   Smoking status: Every Day    Current packs/day: 0.00    Average packs/day: 1 pack/day for 41.3 years (41.3 ttl pk-yrs)    Types: E-cigarettes, Cigarettes    Start date: 03/06/1974    Last attempt to quit: 07/08/2015    Years since quitting: 8.4   Smokeless tobacco: Never   Tobacco comments:    vapor cigarettes, no nicotene  Vaping Use   Vaping status: Every Day   Start date: 07/08/2015   Substances: Nicotine, Flavoring   Devices: Nort, Geek  Substance and Sexual Activity   Alcohol use: No    Alcohol/week: 0.0 standard drinks of alcohol   Drug use: No   Sexual activity: Not Currently    Birth control/protection: Post-menopausal  Other Topics Concern   Not on file  Social History Narrative   Not on file   Social Drivers of Health   Financial Resource Strain: Not on file  Food Insecurity: Not on file  Transportation Needs: Not on file  Physical Activity: Not on file  Stress: Not on file  Social Connections: Not on file  Intimate Partner Violence: Not on file    Family History  Problem Relation Age of Onset   Heart disease Mother    Stroke Mother    Cancer Father        lung  Arthritis Father    Arthritis Paternal Grandmother    Arthritis Paternal Grandfather    Drug abuse Sister    Drug abuse Brother    Post-traumatic stress disorder Brother    Diabetes Sister    Dementia Sister    Drug abuse Brother    Cancer Brother     Allergies  Allergen Reactions   Other Hives and Other (See Comments)    Pt states she was tested for allergies and peas was one that she is allergic to but has never ate enough of them to see a reaction. Pt states when she is around bird feathers she gets the hives FEATHERS   Adhesive [Tape] Hives   Food     PEAS/ patient does not know what type reaction she has/ from allergy test during childhood   Statins Other (See Comments)    Severe myalgia   Pea Other (See Comments)    Other reaction(Jazmeen Axtell): Unknown.  Pt states she was tested for  allergies and peas was one that she is allergic to but has never ate enough of them to see a reaction.    Outpatient Medications Prior to Visit  Medication Sig   Azelastine HCl 137 MCG/SPRAY SOLN Place 1 Inhalation into the nose daily.   Calcium Carb-Cholecalciferol (CALCIUM 600 + D) 600-5 MG-MCG TABS Take by mouth.   cetirizine (ZYRTEC) 10 MG tablet Take 1 tablet (10 mg total) by mouth every morning.   Cholecalciferol (VITAMIN D3) 125 MCG (5000 UT) CAPS Take 1 capsule (5,000 Units total) by mouth daily.   ibuprofen (ADVIL) 800 MG tablet Take 1 tablet (800 mg total) by mouth every 8 (eight) hours as needed.   LINZESS 72 MCG capsule TK 1 C PO QD   LORazepam (ATIVAN) 1 MG tablet Take 1 tablet (1 mg total) by mouth every 8 (eight) hours as needed for anxiety.   magnesium hydroxide (MILK OF MAGNESIA) 400 MG/5ML suspension Take 30 mLs by mouth daily as needed for mild constipation.   Multiple Vitamin (MULTI-VITAMINS) TABS Take 1 tablet by mouth daily.   oxyCODONE (OXY IR/ROXICODONE) 5 MG immediate release tablet Take 1 tablet (5 mg total) by mouth 2 (two) times daily as needed for severe pain (pain score 7-10). Prescribed by Sherron Monday, MD   tiZANidine (ZANAFLEX) 4 MG tablet Take 1 tablet (4 mg total) by mouth 2 (two) times daily.   No facility-administered medications prior to visit.    Review of Systems  Constitutional: Negative.   HENT: Negative.    Eyes: Negative.   Cardiovascular: Negative.   Gastrointestinal: Negative.   Genitourinary: Negative.        Urge incontinence  Musculoskeletal:  Positive for back pain, joint pain and neck pain.       As in hpi  Skin: Negative.   Neurological: Negative.   Endo/Heme/Allergies: Negative.   Psychiatric/Behavioral:  The patient is nervous/anxious.        Objective:   BP 122/71   Pulse 73   Temp 98.3 F (36.8 C)   Ht 5\' 4"  (1.626 m)   Wt 160 lb (72.6 kg)   SpO2 98%   BMI 27.46 kg/m   Vitals:   12/26/23 0910  BP: 122/71   Pulse: 73  Temp: 98.3 F (36.8 C)  Height: 5\' 4"  (1.626 m)  Weight: 160 lb (72.6 kg)  SpO2: 98%  BMI (Calculated): 27.45    Physical Exam Vitals reviewed.  Constitutional:      General: She is not  in acute distress. HENT:     Head: Normocephalic.     Nose: Nose normal.     Mouth/Throat:     Mouth: Mucous membranes are moist.  Eyes:     Extraocular Movements: Extraocular movements intact.     Pupils: Pupils are equal, round, and reactive to light.  Cardiovascular:     Rate and Rhythm: Normal rate and regular rhythm.     Heart sounds: No murmur heard. Pulmonary:     Effort: Pulmonary effort is normal.     Breath sounds: No rhonchi or rales.  Abdominal:     General: Abdomen is flat.     Palpations: There is no hepatomegaly, splenomegaly or mass.  Musculoskeletal:        General: Normal range of motion.     Cervical back: Normal range of motion. No tenderness.  Skin:    General: Skin is warm and dry.  Neurological:     General: No focal deficit present.     Mental Status: She is alert and oriented to person, place, and time.     Cranial Nerves: No cranial nerve deficit.     Motor: No weakness.  Psychiatric:        Mood and Affect: Mood normal.        Behavior: Behavior normal.      No results found for any visits on 12/26/23.  Recent Results (from the past 2160 hours)  Drug Screen 13 with reflex Confirmation (AMP,BAR,BZO,COC,PCP,THC,OPI,OXY,MD,FEN,MEP,PPX,TRAM), Serum     Status: None   Collection Time: 12/18/23 11:44 AM  Result Value Ref Range   Amphetamines, IA Negative Cutoff:50 ng/mL   Barbiturates, IA Negative Cutoff:0.1 ug/mL   Benzodiazepines, IA Negative Cutoff:20 ng/mL   Cocaine & Metabolite, IA Negative Cutoff:25 ng/mL   Phencyclidine, IA Negative Cutoff:8 ng/mL   THC(Marijuana) Metabolite, IA Negative Cutoff:5 ng/mL   Opiates, IA Negative Cutoff:5 ng/mL   Oxycodones, IA Negative Cutoff:5 ng/mL   Methadone, IA Negative Cutoff:25 ng/mL   FENTANYL,  IA Negative Cutoff:1.0 ng/mL   Propoxyphene, IA Negative Cutoff:50 ng/mL   MEPERIDINE, IA Negative Cutoff:100 ng/mL   TRAMADOL, IA Negative Cutoff:50 ng/mL    Comment: This test was developed and its performance characteristics determined by [facility].  It has not been cleared or approved by the Food and Drug Administration.   Lipid panel     Status: Abnormal   Collection Time: 12/18/23 11:53 AM  Result Value Ref Range   Cholesterol, Total 255 (H) 100 - 199 mg/dL   Triglycerides 161 0 - 149 mg/dL   HDL 65 >09 mg/dL   VLDL Cholesterol Cal 23 5 - 40 mg/dL   LDL Chol Calc (NIH) 604 (H) 0 - 99 mg/dL   Chol/HDL Ratio 3.9 0.0 - 4.4 ratio    Comment:                                   T. Chol/HDL Ratio                                             Men  Women                               1/2 Avg.Risk  3.4    3.3  Avg.Risk  5.0    4.4                                2X Avg.Risk  9.6    7.1                                3X Avg.Risk 23.4   11.0   Comprehensive metabolic panel     Status: Abnormal   Collection Time: 12/18/23 11:53 AM  Result Value Ref Range   Glucose 104 (H) 70 - 99 mg/dL   BUN 17 8 - 27 mg/dL   Creatinine, Ser 8.29 0.57 - 1.00 mg/dL   eGFR 78 >56 OZ/HYQ/6.57   BUN/Creatinine Ratio 20 12 - 28   Sodium 143 134 - 144 mmol/L   Potassium 4.4 3.5 - 5.2 mmol/L   Chloride 105 96 - 106 mmol/L   CO2 24 20 - 29 mmol/L   Calcium 9.7 8.7 - 10.3 mg/dL   Total Protein 7.1 6.0 - 8.5 g/dL   Albumin 4.8 3.9 - 4.9 g/dL   Globulin, Total 2.3 1.5 - 4.5 g/dL   Bilirubin Total 0.6 0.0 - 1.2 mg/dL   Alkaline Phosphatase 89 44 - 121 IU/L   AST 33 0 - 40 IU/L   ALT 35 (H) 0 - 32 IU/L  CBC With Diff/Platelet     Status: None   Collection Time: 12/18/23 11:53 AM  Result Value Ref Range   WBC 7.1 3.4 - 10.8 x10E3/uL   RBC 4.78 3.77 - 5.28 x10E6/uL   Hemoglobin 14.2 11.1 - 15.9 g/dL   Hematocrit 84.6 96.2 - 46.6 %   MCV 90 79 - 97 fL   MCH 29.7 26.6 - 33.0 pg    MCHC 33.2 31.5 - 35.7 g/dL   RDW 95.2 84.1 - 32.4 %   Platelets 330 150 - 450 x10E3/uL   Neutrophils 64 Not Estab. %   Lymphs 24 Not Estab. %   Monocytes 7 Not Estab. %   Eos 4 Not Estab. %   Basos 1 Not Estab. %   Neutrophils Absolute 4.5 1.4 - 7.0 x10E3/uL   Lymphocytes Absolute 1.7 0.7 - 3.1 x10E3/uL   Monocytes Absolute 0.5 0.1 - 0.9 x10E3/uL   EOS (ABSOLUTE) 0.3 0.0 - 0.4 x10E3/uL   Basophils Absolute 0.1 0.0 - 0.2 x10E3/uL   Immature Granulocytes 0 Not Estab. %   Immature Grans (Abs) 0.0 0.0 - 0.1 x10E3/uL  Vitamin D (25 hydroxy)     Status: None   Collection Time: 12/18/23 11:53 AM  Result Value Ref Range   Vit D, 25-Hydroxy 49.0 30.0 - 100.0 ng/mL    Comment: Vitamin D deficiency has been defined by the Institute of Medicine and an Endocrine Society practice guideline as a level of serum 25-OH vitamin D less than 20 ng/mL (1,2). The Endocrine Society went on to further define vitamin D insufficiency as a level between 21 and 29 ng/mL (2). 1. IOM (Institute of Medicine). 2010. Dietary reference    intakes for calcium and D. [place]: The    [employer]. 2. Holick MF, Binkley Flaxton, Bischoff-Ferrari HA, et al.    Evaluation, treatment, and prevention of vitamin D    deficiency: an Endocrine Society clinical practice    guideline. JCEM. 2011 Jul; 96(7):1911-30.       Assessment & Plan:  As per  problem list  Problem List Items Addressed This Visit       Other   Chronic pain syndrome (Chronic)   Relevant Orders   Drug Screen 13 with reflex Confirmation (AMP,BAR,BZO,COC,PCP,THC,OPI,OXY,MD,FEN,MEP,PPX,TRAM), Serum   Anxiety and depression   Hyperlipidemia   Relevant Medications   Bempedoic Acid-Ezetimibe (NEXLIZET) 180-10 MG TABS   Other Relevant Orders   Lipid panel   Uric acid   Other Visit Diagnoses       Encounter for well adult exam without abnormal findings    -  Primary       Return in 3 months (on 03/24/2024) for fu with labs prior.    Total time spent: 20 minutes  Luna Fuse, MD  12/26/2023   This document may have been prepared by [facility] Voice Recognition software and as such may include unintentional dictation errors.

## 2024-01-02 LAB — DRUG SCREEN 13 W/CONF , SERUM
Amphetamines, IA: NEGATIVE ng/mL
Barbiturates, IA: NEGATIVE ug/mL
Benzodiazepines, IA: NEGATIVE ng/mL
Cocaine & Metabolite, IA: NEGATIVE ng/mL
FENTANYL, IA: NEGATIVE ng/mL
MEPERIDINE, IA: NEGATIVE ng/mL
Methadone, IA: NEGATIVE ng/mL
Opiates, IA: NEGATIVE ng/mL
Oxycodones, IA: POSITIVE ng/mL — AB
Phencyclidine, IA: NEGATIVE ng/mL
Propoxyphene, IA: NEGATIVE ng/mL
THC(Marijuana) Metabolite, IA: NEGATIVE ng/mL
TRAMADOL, IA: NEGATIVE ng/mL

## 2024-01-02 LAB — OXYCODONES,MS,WB/SP RFX
Oxycocone: 1.7 ng/mL
Oxycodones Confirmation: POSITIVE
Oxymorphone: NEGATIVE ng/mL

## 2024-01-16 ENCOUNTER — Encounter: Payer: Self-pay | Admitting: Internal Medicine

## 2024-01-16 ENCOUNTER — Ambulatory Visit (INDEPENDENT_AMBULATORY_CARE_PROVIDER_SITE_OTHER): Payer: Medicaid Other | Admitting: Internal Medicine

## 2024-01-16 VITALS — BP 127/81 | HR 90 | Ht 64.0 in | Wt 162.0 lb

## 2024-01-16 DIAGNOSIS — M5442 Lumbago with sciatica, left side: Secondary | ICD-10-CM

## 2024-01-16 DIAGNOSIS — E782 Mixed hyperlipidemia: Secondary | ICD-10-CM

## 2024-01-16 DIAGNOSIS — K760 Fatty (change of) liver, not elsewhere classified: Secondary | ICD-10-CM

## 2024-01-16 DIAGNOSIS — G894 Chronic pain syndrome: Secondary | ICD-10-CM

## 2024-01-16 DIAGNOSIS — E663 Overweight: Secondary | ICD-10-CM | POA: Diagnosis not present

## 2024-01-16 DIAGNOSIS — M5441 Lumbago with sciatica, right side: Secondary | ICD-10-CM

## 2024-01-16 DIAGNOSIS — G8929 Other chronic pain: Secondary | ICD-10-CM

## 2024-01-16 MED ORDER — WEGOVY 0.25 MG/0.5ML ~~LOC~~ SOAJ: 0.2500 mg | SUBCUTANEOUS | 0 refills | Status: DC

## 2024-01-16 MED ORDER — OXYCODONE HCL 5 MG PO TABS: 5.0000 mg | ORAL_TABLET | Freq: Two times a day (BID) | ORAL | 0 refills | Status: DC | PRN

## 2024-01-16 MED ORDER — OXYCODONE HCL 5 MG PO TABS: 5.0000 mg | ORAL_TABLET | Freq: Two times a day (BID) | ORAL | 0 refills | Status: AC | PRN

## 2024-01-16 NOTE — Progress Notes (Signed)
 Established Patient Office Visit  Subjective:  Patient ID: Nicole Walter, female    DOB: Aug 31, 1961  Age: 63 y.o. MRN: [id number]  Chief Complaint  Patient presents with   Pain Management    PM    Here for pain management follow up. Chronic pain well controlled on current analgesia. Drug screen satisfactory and pill counts have also been satisfactory.     No other concerns at this time.   Past Medical History:  Diagnosis Date   Abnormal liver enzymes 02/08/2016   Allergy    Anxiety    Chest pain 07/05/2016   Chronic pain    Depression    DJD (degenerative joint disease)    lumbar   Fibromyalgia    High cholesterol    Rash of back 01/28/2016   Weight loss due to medication     Past Surgical History:  Procedure Laterality Date   ANKLE SURGERY     x 2   BREAST BIOPSY Left 1997   CHOLECYSTECTOMY     COLONOSCOPY     COLONOSCOPY WITH PROPOFOL N/A 02/22/2016   Procedure: COLONOSCOPY WITH PROPOFOL;  Surgeon: Christena Deem, MD;  Location: [facility] ENDOSCOPY;  Service: Endoscopy;  Laterality: N/A;   ESOPHAGOGASTRODUODENOSCOPY (EGD) WITH PROPOFOL N/A 02/22/2016   Procedure: ESOPHAGOGASTRODUODENOSCOPY (EGD) WITH PROPOFOL;  Surgeon: Christena Deem, MD;  Location: [facility] ENDOSCOPY;  Service: Endoscopy;  Laterality: N/A;   NASAL SINUS SURGERY     SHOULDER ARTHROSCOPY WITH ROTATOR CUFF REPAIR AND SUBACROMIAL DECOMPRESSION Left 09/23/2019   Procedure: SHOULDER ARTHROSCOPY WITH MINI ROTATOR CUFF REPAIR AND SUBACROMIAL DECOMPRESSION, BICEPS TENODESIS;  Surgeon: Signa Kell, MD;  Location: [facility];  Service: Orthopedics;  Laterality: Left;   TONSILLECTOMY     TUBAL LIGATION      Social History   Socioeconomic History   Marital status: Legally Separated    Spouse name: Not on file   Number of children: Not on file   Years of education: Not on file   Highest education level: Not on file  Occupational History   Not on file  Tobacco Use   Smoking status: Every Day     Current packs/day: 0.00    Average packs/day: 1 pack/day for 41.3 years (41.3 ttl pk-yrs)    Types: E-cigarettes, Cigarettes    Start date: 03/06/1974    Last attempt to quit: 07/08/2015    Years since quitting: 8.5   Smokeless tobacco: Never   Tobacco comments:    vapor cigarettes, no nicotene  Vaping Use   Vaping status: Every Day   Start date: 07/08/2015   Substances: Nicotine, Flavoring   Devices: [employer]  Substance and Sexual Activity   Alcohol use: No    Alcohol/week: 0.0 standard drinks of alcohol   Drug use: No   Sexual activity: Not Currently    Birth control/protection: Post-menopausal  Other Topics Concern   Not on file  Social History Narrative   Not on file   Social Drivers of Health   Financial Resource Strain: Not on file  Food Insecurity: Not on file  Transportation Needs: Not on file  Physical Activity: Not on file  Stress: Not on file  Social Connections: Not on file  Intimate Partner Violence: Not on file    Family History  Problem Relation Age of Onset   Heart disease Mother    Stroke Mother    Cancer Father        lung   Arthritis Father    Arthritis Paternal  Grandmother    Arthritis Paternal Grandfather    Drug abuse Sister    Drug abuse Brother    Post-traumatic stress disorder Brother    Diabetes Sister    Dementia Sister    Drug abuse Brother    Cancer Brother     Allergies  Allergen Reactions   Other Hives and Other (See Comments)    Pt states she was tested for allergies and peas was one that she is allergic to but has never ate enough of them to see a reaction. Pt states when she is around bird feathers she gets the hives FEATHERS   Adhesive [Tape] Hives   Food     PEAS/ patient does not know what type reaction she has/ from allergy test during childhood   Statins Other (See Comments)    Severe myalgia   Pea Other (See Comments)    Other reaction([place]): Unknown.  Pt states she was tested for allergies and peas was one that she  is allergic to but has never ate enough of them to see a reaction.    Outpatient Medications Prior to Visit  Medication Sig   Azelastine HCl 137 MCG/SPRAY SOLN Place 1 Inhalation into the nose daily.   Bempedoic Acid-Ezetimibe (NEXLIZET) 180-10 MG TABS Take 1 tablet by mouth daily.   Calcium Carb-Cholecalciferol (CALCIUM 600 + D) 600-5 MG-MCG TABS Take by mouth.   cetirizine (ZYRTEC) 10 MG tablet Take 1 tablet (10 mg total) by mouth every morning.   Cholecalciferol (VITAMIN D3) 125 MCG (5000 UT) CAPS Take 1 capsule (5,000 Units total) by mouth daily.   ibuprofen (ADVIL) 800 MG tablet Take 1 tablet (800 mg total) by mouth every 8 (eight) hours as needed.   LINZESS 72 MCG capsule TK 1 C PO QD   LORazepam (ATIVAN) 1 MG tablet Take 1 tablet (1 mg total) by mouth every 8 (eight) hours as needed for anxiety.   magnesium hydroxide (MILK OF MAGNESIA) 400 MG/5ML suspension Take 30 mLs by mouth daily as needed for mild constipation.   Multiple Vitamin (MULTI-VITAMINS) TABS Take 1 tablet by mouth daily.   tiZANidine (ZANAFLEX) 4 MG tablet Take 1 tablet (4 mg total) by mouth 2 (two) times daily.   [DISCONTINUED] oxyCODONE (OXY IR/ROXICODONE) 5 MG immediate release tablet Take 1 tablet (5 mg total) by mouth 2 (two) times daily as needed for severe pain (pain score 7-10). Prescribed by Sherron Monday, MD   No facility-administered medications prior to visit.    Review of Systems  Constitutional: Negative.   HENT: Negative.    Eyes: Negative.   Cardiovascular: Negative.   Gastrointestinal: Negative.   Genitourinary: Negative.        Urge incontinence  Musculoskeletal:  Positive for back pain, joint pain and neck pain.       As in hpi  Skin: Negative.   Neurological: Negative.   Endo/Heme/Allergies: Negative.   Psychiatric/Behavioral:  The patient is nervous/anxious.        Objective:   BP 127/81   Pulse 90   Ht 5\' 4"  (1.626 m)   Wt 162 lb (73.5 kg)   SpO2 98%   BMI 27.81 kg/m    Vitals:   01/16/24 1103  BP: 127/81  Pulse: 90  Height: 5\' 4"  (1.626 m)  Weight: 162 lb (73.5 kg)  SpO2: 98%  BMI (Calculated): 27.79    Physical Exam Vitals reviewed.  Constitutional:      General: She is not in acute distress. HENT:  Head: Normocephalic.     Nose: Nose normal.     Mouth/Throat:     Mouth: Mucous membranes are moist.  Eyes:     Extraocular Movements: Extraocular movements intact.     Pupils: Pupils are equal, round, and reactive to light.  Cardiovascular:     Rate and Rhythm: Normal rate and regular rhythm.     Heart sounds: No murmur heard. Pulmonary:     Effort: Pulmonary effort is normal.     Breath sounds: No rhonchi or rales.  Abdominal:     General: Abdomen is flat.     Palpations: There is no hepatomegaly, splenomegaly or mass.  Musculoskeletal:        General: Normal range of motion.     Cervical back: Normal range of motion. No tenderness.  Skin:    General: Skin is warm and dry.  Neurological:     General: No focal deficit present.     Mental Status: She is alert and oriented to person, place, and time.     Cranial Nerves: No cranial nerve deficit.     Motor: No weakness.  Psychiatric:        Mood and Affect: Mood normal.        Behavior: Behavior normal.      No results found for any visits on 01/16/24.  Recent Results (from the past 2160 hours)  Drug Screen 13 with reflex Confirmation (AMP,BAR,BZO,COC,PCP,THC,OPI,OXY,MD,FEN,MEP,PPX,TRAM), Serum     Status: None   Collection Time: 12/18/23 11:44 AM  Result Value Ref Range   Amphetamines, IA Negative Cutoff:50 ng/mL   Barbiturates, IA Negative Cutoff:0.1 ug/mL   Benzodiazepines, IA Negative Cutoff:20 ng/mL   Cocaine & Metabolite, IA Negative Cutoff:25 ng/mL   Phencyclidine, IA Negative Cutoff:8 ng/mL   THC(Marijuana) Metabolite, IA Negative Cutoff:5 ng/mL   Opiates, IA Negative Cutoff:5 ng/mL   Oxycodones, IA Negative Cutoff:5 ng/mL   Methadone, IA Negative Cutoff:25  ng/mL   FENTANYL, IA Negative Cutoff:1.0 ng/mL   Propoxyphene, IA Negative Cutoff:50 ng/mL   MEPERIDINE, IA Negative Cutoff:100 ng/mL   TRAMADOL, IA Negative Cutoff:50 ng/mL    Comment: This test was developed and its performance characteristics determined by [facility].  It has not been cleared or approved by the Food and Drug Administration.   Lipid panel     Status: Abnormal   Collection Time: 12/18/23 11:53 AM  Result Value Ref Range   Cholesterol, Total 255 (H) 100 - 199 mg/dL   Triglycerides 119 0 - 149 mg/dL   HDL 65 >14 mg/dL   VLDL Cholesterol Cal 23 5 - 40 mg/dL   LDL Chol Calc (NIH) 782 (H) 0 - 99 mg/dL   Chol/HDL Ratio 3.9 0.0 - 4.4 ratio    Comment:                                   T. Chol/HDL Ratio                                             Men  Women                               1/2 Avg.Risk  3.4    3.3  Avg.Risk  5.0    4.4                                2X Avg.Risk  9.6    7.1                                3X Avg.Risk 23.4   11.0   Comprehensive metabolic panel     Status: Abnormal   Collection Time: 12/18/23 11:53 AM  Result Value Ref Range   Glucose 104 (H) 70 - 99 mg/dL   BUN 17 8 - 27 mg/dL   Creatinine, Ser 9.14 0.57 - 1.00 mg/dL   eGFR 78 >78 GN/FAO/1.30   BUN/Creatinine Ratio 20 12 - 28   Sodium 143 134 - 144 mmol/L   Potassium 4.4 3.5 - 5.2 mmol/L   Chloride 105 96 - 106 mmol/L   CO2 24 20 - 29 mmol/L   Calcium 9.7 8.7 - 10.3 mg/dL   Total Protein 7.1 6.0 - 8.5 g/dL   Albumin 4.8 3.9 - 4.9 g/dL   Globulin, Total 2.3 1.5 - 4.5 g/dL   Bilirubin Total 0.6 0.0 - 1.2 mg/dL   Alkaline Phosphatase 89 44 - 121 IU/L   AST 33 0 - 40 IU/L   ALT 35 (H) 0 - 32 IU/L  CBC With Diff/Platelet     Status: None   Collection Time: 12/18/23 11:53 AM  Result Value Ref Range   WBC 7.1 3.4 - 10.8 x10E3/uL   RBC 4.78 3.77 - 5.28 x10E6/uL   Hemoglobin 14.2 11.1 - 15.9 g/dL   Hematocrit 86.5 78.4 - 46.6 %   MCV 90 79 - 97 fL   MCH  29.7 26.6 - 33.0 pg   MCHC 33.2 31.5 - 35.7 g/dL   RDW 69.6 29.5 - 28.4 %   Platelets 330 150 - 450 x10E3/uL   Neutrophils 64 Not Estab. %   Lymphs 24 Not Estab. %   Monocytes 7 Not Estab. %   Eos 4 Not Estab. %   Basos 1 Not Estab. %   Neutrophils Absolute 4.5 1.4 - 7.0 x10E3/uL   Lymphocytes Absolute 1.7 0.7 - 3.1 x10E3/uL   Monocytes Absolute 0.5 0.1 - 0.9 x10E3/uL   EOS (ABSOLUTE) 0.3 0.0 - 0.4 x10E3/uL   Basophils Absolute 0.1 0.0 - 0.2 x10E3/uL   Immature Granulocytes 0 Not Estab. %   Immature Grans (Abs) 0.0 0.0 - 0.1 x10E3/uL  Vitamin D (25 hydroxy)     Status: None   Collection Time: 12/18/23 11:53 AM  Result Value Ref Range   Vit D, 25-Hydroxy 49.0 30.0 - 100.0 ng/mL    Comment: Vitamin D deficiency has been defined by the Institute of Medicine and an Endocrine Society practice guideline as a level of serum 25-OH vitamin D less than 20 ng/mL (1,2). The Endocrine Society went on to further define vitamin D insufficiency as a level between 21 and 29 ng/mL (2). 1. IOM (Institute of Medicine). 2010. Dietary reference    intakes for calcium and D. [place]: The    [employer]. 2. Holick MF, Binkley Chubbuck, Bischoff-Ferrari HA, et al.    Evaluation, treatment, and prevention of vitamin D    deficiency: an Endocrine Society clinical practice    guideline. JCEM. 2011 Jul; 96(7):1911-30.   Drug Screen 13 with reflex Confirmation (AMP,BAR,BZO,COC,PCP,THC,OPI,OXY,MD,FEN,MEP,PPX,TRAM), Serum  Status: Abnormal   Collection Time: 12/26/23 10:02 AM  Result Value Ref Range   Amphetamines, IA Negative Cutoff:50 ng/mL   Barbiturates, IA Negative Cutoff:0.1 ug/mL   Benzodiazepines, IA Negative Cutoff:20 ng/mL   Cocaine & Metabolite, IA Negative Cutoff:25 ng/mL   Phencyclidine, IA Negative Cutoff:8 ng/mL   THC(Marijuana) Metabolite, IA Negative Cutoff:5 ng/mL   Opiates, IA Negative Cutoff:5 ng/mL   Oxycodones, IA ++POSITIVE++ (A) Cutoff:5 ng/mL   Methadone, IA  Negative Cutoff:25 ng/mL   FENTANYL, IA Negative Cutoff:1.0 ng/mL   Propoxyphene, IA Negative Cutoff:50 ng/mL   MEPERIDINE, IA Negative Cutoff:100 ng/mL   TRAMADOL, IA Negative Cutoff:50 ng/mL    Comment: This test was developed and its performance characteristics determined by [facility].  It has not been cleared or approved by the Food and Drug Administration.   Oxycodones,MS,WB/Sp Rfx     Status: None   Collection Time: 12/26/23 10:02 AM  Result Value Ref Range   Oxycodones Confirmation Positive    Oxycocone 1.7 ng/mL   Oxymorphone Negative ng/mL    Comment: Expected metabolism of oxycodone class drugs:   Parent Drug       Detected Metabolites  -----------       --------------------  Oxycodone:        Oxymorphone  Oxymorphone:      None Confirmation threshold: 1.0 ng/mL       Assessment & Plan:   Problem List Items Addressed This Visit       Digestive   Fatty liver   Relevant Medications   Semaglutide-Weight Management (WEGOVY) 0.25 MG/0.5ML SOAJ     Nervous and Auditory   Chronic low back pain (Bilateral) (L>R) (Chronic)   Relevant Medications   oxyCODONE (OXY IR/ROXICODONE) 5 MG immediate release tablet   Semaglutide-Weight Management (WEGOVY) 0.25 MG/0.5ML SOAJ   oxyCODONE (OXY IR/ROXICODONE) 5 MG immediate release tablet (Start on 02/16/2024)     Other   Chronic pain syndrome (Chronic)   Relevant Medications   oxyCODONE (OXY IR/ROXICODONE) 5 MG immediate release tablet   oxyCODONE (OXY IR/ROXICODONE) 5 MG immediate release tablet (Start on 02/16/2024)   Hyperlipidemia   Overweight (BMI 25.0-29.9) - Primary   Relevant Medications   Semaglutide-Weight Management (WEGOVY) 0.25 MG/0.5ML SOAJ    Return in about 2 months (around 03/17/2024).   Total time spent: 20 minutes  Luna Fuse, MD  01/16/2024   This document may have been prepared by [facility] Voice Recognition software and as such may include unintentional dictation errors.

## 2024-01-18 ENCOUNTER — Encounter: Payer: Self-pay | Admitting: Internal Medicine

## 2024-02-12 ENCOUNTER — Other Ambulatory Visit: Payer: Self-pay | Admitting: Internal Medicine

## 2024-02-12 DIAGNOSIS — F32A Anxiety disorder, unspecified: Secondary | ICD-10-CM

## 2024-02-12 DIAGNOSIS — G894 Chronic pain syndrome: Secondary | ICD-10-CM

## 2024-02-12 DIAGNOSIS — F419 Anxiety disorder, unspecified: Secondary | ICD-10-CM

## 2024-02-12 DIAGNOSIS — G8929 Other chronic pain: Secondary | ICD-10-CM

## 2024-02-16 ENCOUNTER — Other Ambulatory Visit: Payer: Self-pay | Admitting: Internal Medicine

## 2024-02-16 DIAGNOSIS — K760 Fatty (change of) liver, not elsewhere classified: Secondary | ICD-10-CM

## 2024-02-16 DIAGNOSIS — E663 Overweight: Secondary | ICD-10-CM

## 2024-02-21 ENCOUNTER — Telehealth: Payer: Self-pay

## 2024-02-21 DIAGNOSIS — Z716 Tobacco abuse counseling: Secondary | ICD-10-CM

## 2024-02-21 NOTE — Telephone Encounter (Signed)
 Patient is requesting rx for nicotine patches

## 2024-02-25 MED ORDER — NICOTINE 21 MG/24HR TD PT24: 21.0000 mg | MEDICATED_PATCH | TRANSDERMAL | 1 refills | Status: DC

## 2024-02-25 NOTE — Addendum Note (Signed)
 Addended by: Vanda Gemma AHMAD on: 02/25/2024 01:03 PM   Modules accepted: Orders

## 2024-03-11 ENCOUNTER — Other Ambulatory Visit: Payer: Self-pay

## 2024-03-11 MED ORDER — WEGOVY 0.5 MG/0.5ML ~~LOC~~ SOAJ: 0.5000 mg | SUBCUTANEOUS | 0 refills | Status: DC

## 2024-03-15 ENCOUNTER — Other Ambulatory Visit: Payer: Self-pay | Admitting: Internal Medicine

## 2024-03-15 DIAGNOSIS — G8929 Other chronic pain: Secondary | ICD-10-CM

## 2024-03-15 DIAGNOSIS — F419 Anxiety disorder, unspecified: Secondary | ICD-10-CM

## 2024-03-15 DIAGNOSIS — G894 Chronic pain syndrome: Secondary | ICD-10-CM

## 2024-03-17 ENCOUNTER — Ambulatory Visit: Admitting: Internal Medicine

## 2024-03-17 ENCOUNTER — Other Ambulatory Visit: Payer: Self-pay | Admitting: Internal Medicine

## 2024-03-17 ENCOUNTER — Telehealth: Payer: Self-pay

## 2024-03-17 VITALS — BP 120/72 | HR 70 | Temp 97.8°F | Ht 64.0 in | Wt 155.8 lb

## 2024-03-17 DIAGNOSIS — J302 Other seasonal allergic rhinitis: Secondary | ICD-10-CM

## 2024-03-17 DIAGNOSIS — M5442 Lumbago with sciatica, left side: Secondary | ICD-10-CM | POA: Diagnosis not present

## 2024-03-17 DIAGNOSIS — G894 Chronic pain syndrome: Secondary | ICD-10-CM | POA: Diagnosis not present

## 2024-03-17 DIAGNOSIS — E782 Mixed hyperlipidemia: Secondary | ICD-10-CM | POA: Diagnosis not present

## 2024-03-17 DIAGNOSIS — M5441 Lumbago with sciatica, right side: Secondary | ICD-10-CM

## 2024-03-17 DIAGNOSIS — G8929 Other chronic pain: Secondary | ICD-10-CM

## 2024-03-17 DIAGNOSIS — F32A Depression, unspecified: Secondary | ICD-10-CM

## 2024-03-17 DIAGNOSIS — F419 Anxiety disorder, unspecified: Secondary | ICD-10-CM | POA: Diagnosis not present

## 2024-03-17 DIAGNOSIS — K5900 Constipation, unspecified: Secondary | ICD-10-CM

## 2024-03-17 DIAGNOSIS — Z0130 Encounter for examination of blood pressure without abnormal findings: Secondary | ICD-10-CM

## 2024-03-17 MED ORDER — LINZESS 72 MCG PO CAPS: ORAL_CAPSULE | ORAL | 3 refills | Status: AC

## 2024-03-17 MED ORDER — OXYCODONE HCL 5 MG PO TABS: 5.0000 mg | ORAL_TABLET | Freq: Two times a day (BID) | ORAL | 0 refills | Status: DC | PRN

## 2024-03-17 MED ORDER — NEXLIZET 180-10 MG PO TABS: 1.0000 | ORAL_TABLET | Freq: Every day | ORAL | 2 refills | Status: DC

## 2024-03-17 MED ORDER — LORAZEPAM 1 MG PO TABS
1.0000 mg | ORAL_TABLET | Freq: Three times a day (TID) | ORAL | 1 refills | Status: DC | PRN
Start: 2024-03-17 — End: 2024-05-16

## 2024-03-17 MED ORDER — TIZANIDINE HCL 4 MG PO TABS
4.0000 mg | ORAL_TABLET | Freq: Two times a day (BID) | ORAL | 3 refills | Status: DC
Start: 2024-03-17 — End: 2024-04-15

## 2024-03-17 MED ORDER — AZELASTINE HCL 137 MCG/SPRAY NA SOLN: 1.0000 | Freq: Every day | NASAL | 2 refills | Status: DC

## 2024-03-17 NOTE — Progress Notes (Signed)
 Established Patient Office Visit  Subjective:  Patient ID: Nicole Walter, female    DOB: Dec 14, 1960  Age: 63 y.o. MRN: [id number]  Chief Complaint  Patient presents with   Pain Management    PM    Here for pain management follow up. Chronic pain well controlled on current analgesia. Last UDS satisfactory and pill counts have also been satisfactory.     No other concerns at this time.   Past Medical History:  Diagnosis Date   Abnormal liver enzymes 02/08/2016   Allergy    Anxiety    Chest pain 07/05/2016   Chronic pain    Depression    DJD (degenerative joint disease)    lumbar   Fibromyalgia    High cholesterol    Rash of back 01/28/2016   Weight loss due to medication     Past Surgical History:  Procedure Laterality Date   ANKLE SURGERY     x 2   BREAST BIOPSY Left 1997   CHOLECYSTECTOMY     COLONOSCOPY     COLONOSCOPY WITH PROPOFOL  N/A 02/22/2016   Procedure: COLONOSCOPY WITH PROPOFOL ;  Surgeon: Deveron Fly, MD;  Location: [facility] ENDOSCOPY;  Service: Endoscopy;  Laterality: N/A;   ESOPHAGOGASTRODUODENOSCOPY (EGD) WITH PROPOFOL  N/A 02/22/2016   Procedure: ESOPHAGOGASTRODUODENOSCOPY (EGD) WITH PROPOFOL ;  Surgeon: Deveron Fly, MD;  Location: [facility] ENDOSCOPY;  Service: Endoscopy;  Laterality: N/A;   NASAL SINUS SURGERY     SHOULDER ARTHROSCOPY WITH ROTATOR CUFF REPAIR AND SUBACROMIAL DECOMPRESSION Left 09/23/2019   Procedure: SHOULDER ARTHROSCOPY WITH MINI ROTATOR CUFF REPAIR AND SUBACROMIAL DECOMPRESSION, BICEPS TENODESIS;  Surgeon: Lorri Rota, MD;  Location: [facility];  Service: Orthopedics;  Laterality: Left;   TONSILLECTOMY     TUBAL LIGATION      Social History   Socioeconomic History   Marital status: Legally Separated    Spouse name: Not on file   Number of children: Not on file   Years of education: Not on file   Highest education level: Not on file  Occupational History   Not on file  Tobacco Use   Smoking status: Every Day     Current packs/day: 0.00    Average packs/day: 1 pack/day for 41.3 years (41.3 ttl pk-yrs)    Types: E-cigarettes, Cigarettes    Start date: 03/06/1974    Last attempt to quit: 07/08/2015    Years since quitting: 8.6   Smokeless tobacco: Never   Tobacco comments:    vapor cigarettes, no nicotene  Vaping Use   Vaping status: Every Day   Start date: 07/08/2015   Substances: Nicotine , Flavoring   Devices: [employer]  Substance and Sexual Activity   Alcohol use: No    Alcohol/week: 0.0 standard drinks of alcohol   Drug use: No   Sexual activity: Not Currently    Birth control/protection: Post-menopausal  Other Topics Concern   Not on file  Social History Narrative   Not on file   Social Drivers of Health   Financial Resource Strain: Not on file  Food Insecurity: Not on file  Transportation Needs: Not on file  Physical Activity: Not on file  Stress: Not on file  Social Connections: Not on file  Intimate Partner Violence: Not on file    Family History  Problem Relation Age of Onset   Heart disease Mother    Stroke Mother    Cancer Father        lung   Arthritis Father    Arthritis Paternal  Grandmother    Arthritis Paternal Grandfather    Drug abuse Sister    Drug abuse Brother    Post-traumatic stress disorder Brother    Diabetes Sister    Dementia Sister    Drug abuse Brother    Cancer Brother     Allergies  Allergen Reactions   Other Hives and Other (See Comments)    Pt states she was tested for allergies and peas was one that she is allergic to but has never ate enough of them to see a reaction. Pt states when she is around bird feathers she gets the hives FEATHERS   Adhesive [Tape] Hives   Food     PEAS/ patient does not know what type reaction she has/ from allergy test during childhood   Statins Other (See Comments)    Severe myalgia   Pea Other (See Comments)    Other reaction(Calyn Sivils): Unknown.  Pt states she was tested for allergies and peas was one that she is  allergic to but has never ate enough of them to see a reaction.    Outpatient Medications Prior to Visit  Medication Sig   Calcium Carb-Cholecalciferol (CALCIUM 600 + D) 600-5 MG-MCG TABS Take by mouth.   cetirizine  (ZYRTEC ) 10 MG tablet Take 1 tablet (10 mg total) by mouth every morning.   Cholecalciferol (VITAMIN D3) 125 MCG (5000 UT) CAPS Take 1 capsule (5,000 Units total) by mouth daily.   ibuprofen  (ADVIL ) 800 MG tablet TAKE 1 TABLET BY MOUTH EVERY 8 HOURS AS NEEDED   magnesium hydroxide (MILK OF MAGNESIA) 400 MG/5ML suspension Take 30 mLs by mouth daily as needed for mild constipation.   Multiple Vitamin (MULTI-VITAMINS) TABS Take 1 tablet by mouth daily.   nicotine  (NICODERM CQ  - DOSED IN MG/24 HOURS) 21 mg/24hr patch Place 1 patch (21 mg total) onto the skin daily.   Semaglutide -Weight Management (WEGOVY ) 0.5 MG/0.5ML SOAJ Inject 0.5 mg into the skin once a week.   [DISCONTINUED] Azelastine  HCl 137 MCG/SPRAY SOLN Place 1 Inhalation into the nose daily.   [DISCONTINUED] Bempedoic Acid-Ezetimibe (NEXLIZET ) 180-10 MG TABS Take 1 tablet by mouth daily.   [DISCONTINUED] LINZESS  72 MCG capsule TK 1 C PO QD   [DISCONTINUED] LORazepam  (ATIVAN ) 1 MG tablet TAKE 1 TABLET BY MOUTH EVERY 8 HOURS AS NEEDED FOR ANXIETY   [DISCONTINUED] oxyCODONE  (OXY IR/ROXICODONE ) 5 MG immediate release tablet Take 1 tablet (5 mg total) by mouth 2 (two) times daily as needed for severe pain (pain score 7-10). Prescribed by Shari Daughters, MD   [DISCONTINUED] tiZANidine  (ZANAFLEX ) 4 MG tablet Take 1 tablet by mouth twice daily   No facility-administered medications prior to visit.    ROS     Objective:   BP 120/72   Pulse 70   Temp 97.8 F (36.6 C)   Ht 5\' 4"  (1.626 m)   Wt 155 lb 12.8 oz (70.7 kg)   SpO2 98%   BMI 26.74 kg/m   Vitals:   03/17/24 1046  BP: 120/72  Pulse: 70  Temp: 97.8 F (36.6 C)  Height: 5\' 4"  (1.626 m)  Weight: 155 lb 12.8 oz (70.7 kg)  SpO2: 98%  BMI (Calculated):  26.73    Physical Exam   No results found for any visits on 03/17/24.  Recent Results (from the past 2160 hours)  Drug Screen 13 with reflex Confirmation (AMP,BAR,BZO,COC,PCP,THC,OPI,OXY,MD,FEN,MEP,PPX,TRAM), Serum     Status: None   Collection Time: 12/18/23 11:44 AM  Result Value Ref Range  Amphetamines, IA Negative Cutoff:50 ng/mL   Barbiturates, IA Negative Cutoff:0.1 ug/mL   Benzodiazepines, IA Negative Cutoff:20 ng/mL   Cocaine & Metabolite, IA Negative Cutoff:25 ng/mL   Phencyclidine, IA Negative Cutoff:8 ng/mL   THC(Marijuana) Metabolite, IA Negative Cutoff:5 ng/mL   Opiates, IA Negative Cutoff:5 ng/mL   Oxycodones, IA Negative Cutoff:5 ng/mL   Methadone, IA Negative Cutoff:25 ng/mL   FENTANYL , IA Negative Cutoff:1.0 ng/mL   Propoxyphene, IA Negative Cutoff:50 ng/mL   MEPERIDINE, IA Negative Cutoff:100 ng/mL   TRAMADOL , IA Negative Cutoff:50 ng/mL    Comment: This test was developed and its performance characteristics determined by [facility].  It has not been cleared or approved by the Food and Drug Administration.   Lipid panel     Status: Abnormal   Collection Time: 12/18/23 11:53 AM  Result Value Ref Range   Cholesterol, Total 255 (H) 100 - 199 mg/dL   Triglycerides 782 0 - 149 mg/dL   HDL 65 >95 mg/dL   VLDL Cholesterol Cal 23 5 - 40 mg/dL   LDL Chol Calc (NIH) 621 (H) 0 - 99 mg/dL   Chol/HDL Ratio 3.9 0.0 - 4.4 ratio    Comment:                                   T. Chol/HDL Ratio                                             Men  Women                               1/2 Avg.Risk  3.4    3.3                                   Avg.Risk  5.0    4.4                                2X Avg.Risk  9.6    7.1                                3X Avg.Risk 23.4   11.0   Comprehensive metabolic panel     Status: Abnormal   Collection Time: 12/18/23 11:53 AM  Result Value Ref Range   Glucose 104 (H) 70 - 99 mg/dL   BUN 17 8 - 27 mg/dL   Creatinine, Ser 3.08 0.57 - 1.00 mg/dL    eGFR 78 >65 HQ/ION/6.29   BUN/Creatinine Ratio 20 12 - 28   Sodium 143 134 - 144 mmol/L   Potassium 4.4 3.5 - 5.2 mmol/L   Chloride 105 96 - 106 mmol/L   CO2 24 20 - 29 mmol/L   Calcium 9.7 8.7 - 10.3 mg/dL   Total Protein 7.1 6.0 - 8.5 g/dL   Albumin 4.8 3.9 - 4.9 g/dL   Globulin, Total 2.3 1.5 - 4.5 g/dL   Bilirubin Total 0.6 0.0 - 1.2 mg/dL   Alkaline Phosphatase 89 44 - 121 IU/L   AST 33 0 - 40 IU/L   ALT  35 (H) 0 - 32 IU/L  CBC With Diff/Platelet     Status: None   Collection Time: 12/18/23 11:53 AM  Result Value Ref Range   WBC 7.1 3.4 - 10.8 x10E3/uL   RBC 4.78 3.77 - 5.28 x10E6/uL   Hemoglobin 14.2 11.1 - 15.9 g/dL   Hematocrit 16.1 09.6 - 46.6 %   MCV 90 79 - 97 fL   MCH 29.7 26.6 - 33.0 pg   MCHC 33.2 31.5 - 35.7 g/dL   RDW 04.5 40.9 - 81.1 %   Platelets 330 150 - 450 x10E3/uL   Neutrophils 64 Not Estab. %   Lymphs 24 Not Estab. %   Monocytes 7 Not Estab. %   Eos 4 Not Estab. %   Basos 1 Not Estab. %   Neutrophils Absolute 4.5 1.4 - 7.0 x10E3/uL   Lymphocytes Absolute 1.7 0.7 - 3.1 x10E3/uL   Monocytes Absolute 0.5 0.1 - 0.9 x10E3/uL   EOS (ABSOLUTE) 0.3 0.0 - 0.4 x10E3/uL   Basophils Absolute 0.1 0.0 - 0.2 x10E3/uL   Immature Granulocytes 0 Not Estab. %   Immature Grans (Abs) 0.0 0.0 - 0.1 x10E3/uL  Vitamin D  (25 hydroxy)     Status: None   Collection Time: 12/18/23 11:53 AM  Result Value Ref Range   Vit D, 25-Hydroxy 49.0 30.0 - 100.0 ng/mL    Comment: Vitamin D  deficiency has been defined by the Institute of Medicine and an Endocrine Society practice guideline as a level of serum 25-OH vitamin D  less than 20 ng/mL (1,2). The Endocrine Society went on to further define vitamin D  insufficiency as a level between 21 and 29 ng/mL (2). 1. IOM (Institute of Medicine). 2010. Dietary reference    intakes for calcium and D. [place]: The    [employer]. 2. Holick MF, Binkley Licking, Bischoff-Ferrari HA, et al.    Evaluation, treatment, and  prevention of vitamin D     deficiency: an Endocrine Society clinical practice    guideline. JCEM. 2011 Jul; 96(7):1911-30.   Drug Screen 13 with reflex Confirmation (AMP,BAR,BZO,COC,PCP,THC,OPI,OXY,MD,FEN,MEP,PPX,TRAM), Serum     Status: Abnormal   Collection Time: 12/26/23 10:02 AM  Result Value Ref Range   Amphetamines, IA Negative Cutoff:50 ng/mL   Barbiturates, IA Negative Cutoff:0.1 ug/mL   Benzodiazepines, IA Negative Cutoff:20 ng/mL   Cocaine & Metabolite, IA Negative Cutoff:25 ng/mL   Phencyclidine, IA Negative Cutoff:8 ng/mL   THC(Marijuana) Metabolite, IA Negative Cutoff:5 ng/mL   Opiates, IA Negative Cutoff:5 ng/mL   Oxycodones, IA ++POSITIVE++ (A) Cutoff:5 ng/mL   Methadone, IA Negative Cutoff:25 ng/mL   FENTANYL , IA Negative Cutoff:1.0 ng/mL   Propoxyphene, IA Negative Cutoff:50 ng/mL   MEPERIDINE, IA Negative Cutoff:100 ng/mL   TRAMADOL , IA Negative Cutoff:50 ng/mL    Comment: This test was developed and its performance characteristics determined by [facility].  It has not been cleared or approved by the Food and Drug Administration.   Oxycodones,MS,WB/Sp Rfx     Status: None   Collection Time: 12/26/23 10:02 AM  Result Value Ref Range   Oxycodones Confirmation Positive    Oxycocone 1.7 ng/mL   Oxymorphone Negative ng/mL    Comment: Expected metabolism of oxycodone  class drugs:   Parent Drug       Detected Metabolites  -----------       --------------------  Oxycodone :        Oxymorphone  Oxymorphone:      None Confirmation threshold: 1.0 ng/mL       Assessment & Plan:  As per problem list  Problem List Items Addressed This Visit       Nervous and Auditory   Chronic low back pain (Bilateral) (L>R) (Chronic)   Relevant Medications   oxyCODONE  (OXY IR/ROXICODONE ) 5 MG immediate release tablet   LORazepam  (ATIVAN ) 1 MG tablet   tiZANidine  (ZANAFLEX ) 4 MG tablet     Other   Chronic pain syndrome - Primary (Chronic)   Relevant Medications   oxyCODONE   (OXY IR/ROXICODONE ) 5 MG immediate release tablet   tiZANidine  (ZANAFLEX ) 4 MG tablet   Anxiety and depression   Relevant Medications   LORazepam  (ATIVAN ) 1 MG tablet   Hyperlipidemia   Relevant Medications   Bempedoic Acid-Ezetimibe (NEXLIZET ) 180-10 MG TABS   Constipation   Relevant Medications   LINZESS  72 MCG capsule   Other Visit Diagnoses       Seasonal allergic rhinitis, unspecified trigger       Relevant Medications   Azelastine  HCl 137 MCG/SPRAY SOLN       Return in about 1 month (around 04/17/2024).   Total time spent: 20 minutes  Arzella Bitters, MD  03/17/2024   This document may have been prepared by [facility] Voice Recognition software and as such may include unintentional dictation errors.

## 2024-03-17 NOTE — Telephone Encounter (Signed)
 PA for pts nexlizet  was denied due to the following     Beneficiary has diagnosis of heterozygous familial hypercholesterolemia (HeFH) or established atherosclerotic cardiovascular disease (ASCVD) defined as acute coronary syndromes,   or  a history of myocardial infarction, stable or unstable angina, coronary or other arterial revascularization, stroke, transient ischemic attack, or peripheral arterial disease of atherosclerotic origin

## 2024-03-25 ENCOUNTER — Ambulatory Visit: Payer: Medicaid Other | Admitting: Internal Medicine

## 2024-04-02 ENCOUNTER — Other Ambulatory Visit: Payer: Self-pay | Admitting: Internal Medicine

## 2024-04-03 ENCOUNTER — Other Ambulatory Visit: Payer: Self-pay

## 2024-04-04 MED ORDER — WEGOVY 1 MG/0.5ML ~~LOC~~ SOAJ: 1.0000 mg | SUBCUTANEOUS | 1 refills | Status: DC

## 2024-04-04 MED ORDER — NICOTINE 14 MG/24HR TD PT24
14.0000 mg | MEDICATED_PATCH | TRANSDERMAL | 0 refills | Status: AC
Start: 2024-04-04 — End: ?

## 2024-04-07 ENCOUNTER — Ambulatory Visit: Admitting: Internal Medicine

## 2024-04-07 ENCOUNTER — Ambulatory Visit: Admitting: Student in an Organized Health Care Education/Training Program

## 2024-04-07 ENCOUNTER — Ambulatory Visit
Admission: RE | Admit: 2024-04-07 | Discharge: 2024-04-07 | Disposition: A | Discharge status: Home / Self Care | Source: Ambulatory Visit | Attending: Student in an Organized Health Care Education/Training Program | Admitting: Student in an Organized Health Care Education/Training Program

## 2024-04-07 ENCOUNTER — Encounter: Payer: Self-pay | Admitting: Student in an Organized Health Care Education/Training Program

## 2024-04-07 VITALS — BP 114/75 | HR 60 | Temp 97.0°F | Resp 16 | Ht 64.0 in | Wt 152.4 lb

## 2024-04-07 DIAGNOSIS — M5416 Radiculopathy, lumbar region: Secondary | ICD-10-CM | POA: Insufficient documentation

## 2024-04-07 DIAGNOSIS — M542 Cervicalgia: Secondary | ICD-10-CM

## 2024-04-07 DIAGNOSIS — M5412 Radiculopathy, cervical region: Secondary | ICD-10-CM

## 2024-04-07 DIAGNOSIS — G894 Chronic pain syndrome: Secondary | ICD-10-CM

## 2024-04-07 DIAGNOSIS — G8929 Other chronic pain: Secondary | ICD-10-CM | POA: Insufficient documentation

## 2024-04-07 DIAGNOSIS — M25551 Pain in right hip: Secondary | ICD-10-CM | POA: Insufficient documentation

## 2024-04-07 MED ORDER — DEXAMETHASONE SODIUM PHOSPHATE 10 MG/ML IJ SOLN
10.0000 mg | Freq: Once | INTRAMUSCULAR | Status: AC
  Administered 2024-04-07: 10 mg

## 2024-04-07 MED ORDER — ROPIVACAINE HCL 2 MG/ML IJ SOLN
INTRAMUSCULAR | Status: AC
  Filled 2024-04-07: qty 20

## 2024-04-07 MED ORDER — DEXAMETHASONE SODIUM PHOSPHATE 10 MG/ML IJ SOLN
INTRAMUSCULAR | Status: AC
  Filled 2024-04-07: qty 1

## 2024-04-07 MED ORDER — ROPIVACAINE HCL 2 MG/ML IJ SOLN
9.0000 mL | Freq: Once | INTRAMUSCULAR | Status: AC
  Administered 2024-04-07: 9 mL via PERINEURAL

## 2024-04-07 NOTE — Progress Notes (Signed)
 PROVIDER NOTE: Interpretation of information contained herein should be left to medically-trained personnel. Specific patient instructions are provided elsewhere under "Patient Instructions" section of medical record. This document was created in part using STT-dictation technology, any transcriptional errors that may result from this process are unintentional.  Patient: Nicole Walter Type: Established DOB: Jul 05, 1961 MRN: [id number] PCP: Shari Daughters, MD  Service: Procedure DOS: 04/07/2024 Setting: Ambulatory Location: Ambulatory outpatient facility Delivery: Face-to-face Provider: Cephus Collin, MD Specialty: Interventional Pain Management Specialty designation: 09 Location: Outpatient facility Ref. Prov.: Cephus Collin, MD       Interventional Therapy   Type:  Right cervical paraspinal, trapezius, latissimus dorsi, lumbar paraspinal Trigger Point Injection (Myoneural Block) (3+muscle groups)  #1 (w/ steroids)  CPT: 20553 Laterality: Bilateral (-50)   Imaging: N/A. Landmark-guided",            DOS: 04/07/2024  Performed by: Cephus Collin, MD  Medical Necessity (reasoning)  Purpose: Diagnostic/Therapeutic Rationale (medical necessity): procedure needed and proper for the diagnosis and/or treatment of Nicole Walter's medical symptoms and needs.  1. Chronic pain syndrome   2. Cervicalgia   3. Cervical radicular pain   4. Chronic radicular lumbar pain   5. Chronic right hip pain    NAS-11 Pain score:   Pre-procedure: 2 /10   Post-procedure: 2 /10         Position  Prep  Materials  Position: Sitting. Patient assisted into a comfortable position. Pressure points checked.  Prep solution: ChloraPrep (2% chlorhexidine  gluconate and 70% isopropyl alcohol) The target area was identified and the area prepped in the usual manner.  Prep Area: right cervical, thoracic, lumbar Materials:   Tray: Block Needle(s):  Type: Regular  Gauge (G): 25  Length: 1.5-in  Qty: 1  H&P  (Pre-op Assessment):  Nicole Walter is a 63 y.o. (year old), female patient, seen today for interventional treatment. She  has a past surgical history that includes Cholecystectomy; Tubal ligation; Ankle surgery; Nasal sinus surgery; Tonsillectomy; Colonoscopy; Esophagogastroduodenoscopy (egd) with propofol  (N/A, 02/22/2016); Colonoscopy with propofol  (N/A, 02/22/2016); Breast biopsy (Left, 1997); and Shoulder arthroscopy with rotator cuff repair and subacromial decompression (Left, 09/23/2019). Nicole Walter has a current medication list which includes the following prescription(s): azelastine  hcl, calcium carb-cholecalciferol, cetirizine , vitamin d3, ibuprofen , linzess , lorazepam , multi-vitamins, nicotine , oxycodone , wegovy , wegovy , tizanidine , trintellix , nexlizet , and milk of magnesia. Her primarily concern today is the Shoulder Pain (Right, just below shoulder ), Abdominal Pain (Right side ), and Hip Pain (Right )  Initial Vital Signs:  Pulse/HCG Rate: 60  Temp: (!) 97 F (36.1 C) Resp: 16 BP: 114/75 SpO2: 100 %  BMI: Estimated body mass index is 26.16 kg/m as calculated from the following:   Height as of this encounter: 5\' 4"  (1.626 m).   Weight as of this encounter: 152 lb 6.4 oz (69.1 kg).  Risk Assessment: Allergies: Reviewed. She is allergic to other, adhesive [tape], food, statins, and pea.  Allergy Precautions: None required Coagulopathies: Reviewed. None identified.  Blood-thinner therapy: None at this time Active Infection(s): Reviewed. None identified. Nicole Walter is afebrile  Site Confirmation: Nicole Walter was asked to confirm the procedure and laterality before marking the site Procedure checklist: Completed Consent: Before the procedure and under the influence of no sedative(s), amnesic(s), or anxiolytics, the patient was informed of the treatment options, risks and possible complications. To fulfill our ethical and legal obligations, as recommended by the American Medical Association's  Code of Ethics, I have informed the patient of my clinical impression; the nature  and purpose of the treatment or procedure; the risks, benefits, and possible complications of the intervention; the alternatives, including doing nothing; the risk(s) and benefit(s) of the alternative treatment(s) or procedure(s); and the risk(s) and benefit(s) of doing nothing. The patient was provided information about the general risks and possible complications associated with the procedure. These may include, but are not limited to: failure to achieve desired goals, infection, bleeding, organ or nerve damage, allergic reactions, paralysis, and death. In addition, the patient was informed of those risks and complications associated to the procedure, such as failure to decrease pain; infection; bleeding; organ or nerve damage with subsequent damage to sensory, motor, and/or autonomic systems, resulting in permanent pain, numbness, and/or weakness of one or several areas of the body; allergic reactions; (i.e.: anaphylactic reaction); and/or death. Furthermore, the patient was informed of those risks and complications associated with the medications. These include, but are not limited to: allergic reactions (i.e.: anaphylactic or anaphylactoid reaction(s)); adrenal axis suppression; blood sugar elevation that in diabetics may result in ketoacidosis or comma; water retention that in patients with history of congestive heart failure may result in shortness of breath, pulmonary edema, and decompensation with resultant heart failure; weight gain; swelling or edema; medication-induced neural toxicity; particulate matter embolism and blood vessel occlusion with resultant organ, and/or nervous system infarction; and/or aseptic necrosis of one or more joints. Finally, the patient was informed that Medicine is not an exact science; therefore, there is also the possibility of unforeseen or unpredictable risks and/or possible complications  that may result in a catastrophic outcome. The patient indicated having understood very clearly. We have given the patient no guarantees and we have made no promises. Enough time was given to the patient to ask questions, all of which were answered to the patient's satisfaction. Ms. Zelaya has indicated that she wanted to continue with the procedure. Attestation: I, the ordering provider, attest that I have discussed with the patient the benefits, risks, side-effects, alternatives, likelihood of achieving goals, and potential problems during recovery for the procedure that I have provided informed consent. Date  Time: 04/07/2024  1:56 PM   Pre-Procedure Preparation:  Monitoring: As per clinic protocol. Respiration, ETCO2, SpO2, BP, heart rate and rhythm monitor placed and checked for adequate function Safety Precautions: Patient was assessed for positional comfort and pressure points before starting the procedure. Time-out: I initiated and conducted the "Time-out" before starting the procedure, as per protocol. The patient was asked to participate by confirming the accuracy of the "Time Out" information. Verification of the correct person, site, and procedure were performed and confirmed by me, the nursing staff, and the patient. "Time-out" conducted as per Joint Commission's Universal Protocol (UP.01.01.01). Time: 1231 Start Time: 1231 hrs.   Narrative                Start Time: 1231 hrs.  Standard Safety Precautions: Protocol guidelines were followed. Aspiration was conducted prior to injection. At no point did I inject any substances, as a needle was being advanced. No attempts were made at seeking a paresthesia. Safe injection practices and needle disposal techniques used. Medications properly checked for expiration dates. SDV (single dose vial) medications used.  Local Anesthesia: Skin & deeper tissues infiltrated with local anesthetic. Appropriate amount of time allowed for local anesthetics to  take effect.   Technical description:  The target area was identified and the area prepped in the usual manner. The procedure needles were then advanced to the target area. Proper needle placement secured.  Negative aspiration confirmed. Solution injected in intermittent fashion, asking for systemic symptoms every 0.5cc of injectate. The needles were then removed and the area cleansed, making sure to leave some of the prepping solution back to take advantage of its long term bactericidal properties.  Approx 15 trigger points injected with 0.5-1cc of nerve block solution containing ropivacaine  and decadron  (See below)  Vitals:   04/07/24 1359  BP: 114/75  Pulse: 60  Resp: 16  Temp: (!) 97 F (36.1 C)  TempSrc: Temporal  SpO2: 100%  Weight: 152 lb 6.4 oz (69.1 kg)  Height: 5\' 4"  (1.626 m)     End Time: 1237 hrs.  Imaging Guidance                Type of Imaging Technique: None used Indication(s): N/A Exposure Time: No patient exposure Contrast: None used. Fluoroscopic Guidance: N/A Ultrasound Guidance: N/A Interpretation: N/A   Post-operative Assessment:  Post-procedure Vital Signs:  Pulse/HCG Rate: 60  Temp: (!) 97 F (36.1 C) Resp: 16 BP: 114/75 SpO2: 100 %  EBL: None  Complications: No immediate post-treatment complications observed by team, or reported by patient.  Note: The patient tolerated the entire procedure well. A repeat set of vitals were taken after the procedure and the patient was kept under observation following institutional policy, for this type of procedure. Post-procedural neurological assessment was performed, showing return to baseline, prior to discharge. The patient was provided with post-procedure discharge instructions, including a section on how to identify potential problems. Should any problems arise concerning this procedure, the patient was given instructions to immediately contact us , at any time, without hesitation. In any case, we plan to  contact the patient by telephone for a follow-up status report regarding this interventional procedure.  Comments:  No additional relevant information.   Plan of Care (POC)  Hip xray for increased right hip pain Orders:  Orders Placed This Encounter  Procedures   DG HIP UNILAT W OR W/O PELVIS 2-3 VIEWS RIGHT    Please describe any evidence of DJD, such as joint narrowing, asymmetry, cysts, or any anomalies in bone density, production, or erosion.    Standing Status:   Future    Number of Occurrences:   1    Expiration Date:   07/08/2024    Scheduling Instructions:     Please make sure that the patient understands that this needs to be done as soon as possible. Never have the patient do the imaging "just before the next appointment". Inform patient that having the imaging done within the [facility] will expedite the availability of the results and will provide      imaging availability to the requesting physician. In addition inform the patient that the imaging order has an expiration date and will not be renewed if not done within the active period.    Reason for Exam (SYMPTOM  OR DIAGNOSIS REQUIRED):   Right hip pain/arthralgia    Preferred imaging location?:   [facility]    Call Results- Best Contact Number?:   [phone] [place] [facility]    Release to patient:   Immediate     Medications ordered for procedure: Meds ordered this encounter  Medications   dexamethasone  (DECADRON ) injection 10 mg   ropivacaine  (PF) 2 mg/mL (0.2%) (NAROPIN ) injection 9 mL   Medications administered: We administered dexamethasone  and ropivacaine  (PF) 2 mg/mL (0.2%).  See the medical record for exact dosing, route, and time of administration.  Follow-up plan:   Return in about 4 weeks (around 05/05/2024) for PPE, F2F (also review hip xray).     Recent Visits No visits were found meeting these conditions. Showing recent visits within past 90  days and meeting all other requirements Today's Visits Date Type Provider Dept  04/07/24 Procedure visit Cephus Collin, MD [facility]  Showing today's visits and meeting all other requirements Future Appointments Date Type Provider Dept  05/08/24 Appointment Cephus Collin, MD [facility]  Showing future appointments within next 90 days and meeting all other requirements   Disposition: Discharge home  Discharge (Date  Time): 04/07/2024; 1442 hrs.   Primary Care Physician: Shari Daughters, MD Location: [facility] Note by: Cephus Collin, MD (TTS technology used. I apologize for any typographical errors that were not detected and corrected.) Date: 04/07/2024; Time: 3:13 PM  Disclaimer:  Medicine is not an Visual merchandiser. The only guarantee in medicine is that nothing is guaranteed. It is important to note that the decision to proceed with this intervention was based on the information collected from the patient. The Data and conclusions were drawn from the patient's questionnaire, the interview, and the physical examination. Because the information was provided in large part by the patient, it cannot be guaranteed that it has not been purposely or unconsciously manipulated. Every effort has been made to obtain as much relevant data as possible for this evaluation. It is important to note that the conclusions that lead to this procedure are derived in large part from the available data. Always take into account that the treatment will also be dependent on availability of resources and existing treatment guidelines, considered by other Pain Management Practitioners as being common knowledge and practice, at the time of the intervention. For Medico-Legal purposes, it is also important to point out that variation in procedural techniques and pharmacological choices are the acceptable norm. The indications, contraindications, technique, and results of the  above procedure should only be interpreted and judged by a Board-Certified Interventional Pain Specialist with extensive familiarity and expertise in the same exact procedure and technique.

## 2024-04-07 NOTE — Patient Instructions (Signed)

## 2024-04-07 NOTE — Progress Notes (Signed)
 Safety precautions to be maintained throughout the outpatient stay will include: orient to surroundings, keep bed in low position, maintain call bell within reach at all times, provide assistance with transfer out of bed and ambulation.

## 2024-04-08 ENCOUNTER — Telehealth: Payer: Self-pay

## 2024-04-08 NOTE — Telephone Encounter (Signed)
 Post procedure follow up.  Patient states she is doing good.

## 2024-04-11 ENCOUNTER — Ambulatory Visit: Admitting: Internal Medicine

## 2024-04-15 ENCOUNTER — Ambulatory Visit (INDEPENDENT_AMBULATORY_CARE_PROVIDER_SITE_OTHER): Admitting: Internal Medicine

## 2024-04-15 VITALS — BP 132/76 | HR 80 | Temp 96.3°F | Ht 64.0 in | Wt 152.2 lb

## 2024-04-15 DIAGNOSIS — E782 Mixed hyperlipidemia: Secondary | ICD-10-CM

## 2024-04-15 DIAGNOSIS — F419 Anxiety disorder, unspecified: Secondary | ICD-10-CM

## 2024-04-15 DIAGNOSIS — Z716 Tobacco abuse counseling: Secondary | ICD-10-CM

## 2024-04-15 DIAGNOSIS — Z0130 Encounter for examination of blood pressure without abnormal findings: Secondary | ICD-10-CM

## 2024-04-15 DIAGNOSIS — E663 Overweight: Secondary | ICD-10-CM

## 2024-04-15 DIAGNOSIS — F32A Depression, unspecified: Secondary | ICD-10-CM

## 2024-04-15 NOTE — Progress Notes (Signed)
 Established Patient Office Visit  Subjective:  Patient ID: Nicole Walter, female    DOB: 30-Dec-1960  Age: 63 y.o. MRN: [id number]  Chief Complaint  Patient presents with   Follow-up    3 Month Follow Up    No new complaints, here for weight management. Lost additional 3 lbs since her last visit.     No other concerns at this time.   Past Medical History:  Diagnosis Date   Abnormal liver enzymes 02/08/2016   Allergy    Anxiety    Chest pain 07/05/2016   Chronic pain    Depression    DJD (degenerative joint disease)    lumbar   Fibromyalgia    High cholesterol    Rash of back 01/28/2016   Weight loss due to medication     Past Surgical History:  Procedure Laterality Date   ANKLE SURGERY     x 2   BREAST BIOPSY Left 1997   CHOLECYSTECTOMY     COLONOSCOPY     COLONOSCOPY WITH PROPOFOL  N/A 02/22/2016   Procedure: COLONOSCOPY WITH PROPOFOL ;  Surgeon: Deveron Fly, MD;  Location: [facility] ENDOSCOPY;  Service: Endoscopy;  Laterality: N/A;   ESOPHAGOGASTRODUODENOSCOPY (EGD) WITH PROPOFOL  N/A 02/22/2016   Procedure: ESOPHAGOGASTRODUODENOSCOPY (EGD) WITH PROPOFOL ;  Surgeon: Deveron Fly, MD;  Location: [facility] ENDOSCOPY;  Service: Endoscopy;  Laterality: N/A;   NASAL SINUS SURGERY     SHOULDER ARTHROSCOPY WITH ROTATOR CUFF REPAIR AND SUBACROMIAL DECOMPRESSION Left 09/23/2019   Procedure: SHOULDER ARTHROSCOPY WITH MINI ROTATOR CUFF REPAIR AND SUBACROMIAL DECOMPRESSION, BICEPS TENODESIS;  Surgeon: Lorri Rota, MD;  Location: [facility];  Service: Orthopedics;  Laterality: Left;   TONSILLECTOMY     TUBAL LIGATION      Social History   Socioeconomic History   Marital status: Legally Separated    Spouse name: Not on file   Number of children: Not on file   Years of education: Not on file   Highest education level: Not on file  Occupational History   Not on file  Tobacco Use   Smoking status: Every Day    Current packs/day: 0.00    Average packs/day: 1  pack/day for 41.3 years (41.3 ttl pk-yrs)    Types: E-cigarettes, Cigarettes    Start date: 03/06/1974    Last attempt to quit: 07/08/2015    Years since quitting: 8.7   Smokeless tobacco: Never   Tobacco comments:    vapor cigarettes, no nicotene  Vaping Use   Vaping status: Every Day   Start date: 07/08/2015   Substances: Nicotine , Flavoring   Devices: [employer], [employer]  Substance and Sexual Activity   Alcohol use: No    Alcohol/week: 0.0 standard drinks of alcohol   Drug use: No   Sexual activity: Not Currently    Birth control/protection: Post-menopausal  Other Topics Concern   Not on file  Social History Narrative   Not on file   Social Drivers of Health   Financial Resource Strain: Not on file  Food Insecurity: Not on file  Transportation Needs: Not on file  Physical Activity: Not on file  Stress: Not on file  Social Connections: Not on file  Intimate Partner Violence: Not on file    Family History  Problem Relation Age of Onset   Heart disease Mother    Stroke Mother    Cancer Father        lung   Arthritis Father    Arthritis Paternal Grandmother    Arthritis Paternal  Grandfather    Drug abuse Sister    Drug abuse Brother    Post-traumatic stress disorder Brother    Diabetes Sister    Dementia Sister    Drug abuse Brother    Cancer Brother     Allergies  Allergen Reactions   Other Hives and Other (See Comments)    Pt states she was tested for allergies and peas was one that she is allergic to but has never ate enough of them to see a reaction. Pt states when she is around bird feathers she gets the hives FEATHERS   Adhesive [Tape] Hives   Food     PEAS/ patient does not know what type reaction she has/ from allergy test during childhood   Statins Other (See Comments)    Severe myalgia   Pea Other (See Comments)    Other reaction(Yeimy Brabant): Unknown.  Pt states she was tested for allergies and peas was one that she is allergic to but has never ate enough of them to  see a reaction.    Outpatient Medications Prior to Visit  Medication Sig Note   Azelastine  HCl 137 MCG/SPRAY SOLN Place 1 Inhalation into the nose daily.    Bempedoic Acid-Ezetimibe (NEXLIZET ) 180-10 MG TABS Take 1 tablet by mouth daily.    Calcium Carb-Cholecalciferol (CALCIUM 600 + D) 600-5 MG-MCG TABS Take by mouth.    cetirizine  (ZYRTEC ) 10 MG tablet Take 1 tablet (10 mg total) by mouth every morning.    Cholecalciferol (VITAMIN D3) 125 MCG (5000 UT) CAPS Take 1 capsule (5,000 Units total) by mouth daily.    ibuprofen  (ADVIL ) 800 MG tablet TAKE 1 TABLET BY MOUTH EVERY 8 HOURS AS NEEDED    LINZESS  72 MCG capsule TK 1 C PO QD    LORazepam  (ATIVAN ) 1 MG tablet Take 1 tablet (1 mg total) by mouth every 8 (eight) hours as needed. for anxiety    magnesium hydroxide (MILK OF MAGNESIA) 400 MG/5ML suspension Take 30 mLs by mouth daily as needed for mild constipation.    Multiple Vitamin (MULTI-VITAMINS) TABS Take 1 tablet by mouth daily.    nicotine  (NICODERM CQ  - DOSED IN MG/24 HOURS) 14 mg/24hr patch Place 1 patch (14 mg total) onto the skin daily.    oxyCODONE  (OXY IR/ROXICODONE ) 5 MG immediate release tablet Take 1 tablet (5 mg total) by mouth 2 (two) times daily as needed for severe pain (pain score 7-10). Prescribed by Shari Daughters, MD    Semaglutide -Weight Management (WEGOVY ) 0.5 MG/0.5ML SOAJ Inject 0.5 mg into the skin once a week.    Semaglutide -Weight Management (WEGOVY ) 1 MG/0.5ML SOAJ Inject 1 mg into the skin once a week.    vortioxetine  HBr (TRINTELLIX ) 5 MG TABS tablet TAKE 1 TABLET BY MOUTH THREE TIMES DAILY BEFORE MEAL(Tonyia Marschall)    [DISCONTINUED] tiZANidine  (ZANAFLEX ) 4 MG tablet Take 1 tablet (4 mg total) by mouth 2 (two) times daily. 04/15/2024: dry mouth   No facility-administered medications prior to visit.    Review of Systems  Constitutional:  Positive for weight loss (3).  HENT: Negative.    Eyes: Negative.   Cardiovascular: Negative.   Gastrointestinal: Negative.    Genitourinary: Negative.        Urge incontinence  Musculoskeletal:  Positive for back pain, joint pain and neck pain.       As in hpi  Skin: Negative.   Neurological: Negative.   Endo/Heme/Allergies: Negative.   Psychiatric/Behavioral:  The patient is nervous/anxious.  Objective:   BP 132/76   Pulse 80   Temp (!) 96.3 F (35.7 C) (Tympanic)   Ht 5\' 4"  (1.626 m)   Wt 152 lb 3.2 oz (69 kg)   SpO2 97%   BMI 26.13 kg/m   Vitals:   04/15/24 1308  BP: 132/76  Pulse: 80  Temp: (!) 96.3 F (35.7 C)  Height: 5\' 4"  (1.626 m)  Weight: 152 lb 3.2 oz (69 kg)  SpO2: 97%  TempSrc: Tympanic  BMI (Calculated): 26.11    Physical Exam Vitals reviewed.  Constitutional:      General: She is not in acute distress. HENT:     Head: Normocephalic.     Nose: Nose normal.     Mouth/Throat:     Mouth: Mucous membranes are moist.  Eyes:     Extraocular Movements: Extraocular movements intact.     Pupils: Pupils are equal, round, and reactive to light.  Cardiovascular:     Rate and Rhythm: Normal rate and regular rhythm.     Heart sounds: No murmur heard. Pulmonary:     Effort: Pulmonary effort is normal.     Breath sounds: No rhonchi or rales.  Abdominal:     General: Abdomen is flat.     Palpations: There is no hepatomegaly, splenomegaly or mass.  Musculoskeletal:        General: Normal range of motion.     Cervical back: Normal range of motion. No tenderness.  Skin:    General: Skin is warm and dry.  Neurological:     General: No focal deficit present.     Mental Status: She is alert and oriented to person, place, and time.     Cranial Nerves: No cranial nerve deficit.     Motor: No weakness.  Psychiatric:        Mood and Affect: Mood normal.        Behavior: Behavior normal.      No results found for any visits on 04/15/24.  No results found for this or any previous visit (from the past 2160 hours).    Assessment & Plan:  The current medical regimen is  effective;  continue present plan and medications. Stricter low calorie diet and exercise as much as possible.  Problem List Items Addressed This Visit       Other   Anxiety and depression   Hyperlipidemia   Relevant Orders   Comprehensive metabolic panel with GFR   Overweight (BMI 25.0-29.9) - Primary   Other Visit Diagnoses       Tobacco abuse counseling           Return in about 2 months (around 06/15/2024) for fu with labs prior.   Total time spent: 20 minutes  Nicole Bitters, MD  04/15/2024   This document may have been prepared by [facility] Voice Recognition software and as such may include unintentional dictation errors.

## 2024-04-16 ENCOUNTER — Ambulatory Visit (INDEPENDENT_AMBULATORY_CARE_PROVIDER_SITE_OTHER): Admitting: Internal Medicine

## 2024-04-16 DIAGNOSIS — M5442 Lumbago with sciatica, left side: Secondary | ICD-10-CM | POA: Diagnosis not present

## 2024-04-16 DIAGNOSIS — M5441 Lumbago with sciatica, right side: Secondary | ICD-10-CM

## 2024-04-16 DIAGNOSIS — G8929 Other chronic pain: Secondary | ICD-10-CM

## 2024-04-16 DIAGNOSIS — Z0130 Encounter for examination of blood pressure without abnormal findings: Secondary | ICD-10-CM

## 2024-04-16 MED ORDER — OXYCODONE HCL 5 MG PO TABS: 5.0000 mg | ORAL_TABLET | Freq: Two times a day (BID) | ORAL | 0 refills | Status: DC | PRN

## 2024-04-16 MED ORDER — IBUPROFEN 800 MG PO TABS
800.0000 mg | ORAL_TABLET | Freq: Three times a day (TID) | ORAL | 0 refills | Status: DC | PRN
Start: 2024-04-16 — End: 2024-06-16

## 2024-04-16 NOTE — Progress Notes (Signed)
 Established Patient Office Visit  Subjective:  Patient ID: Nicole Walter, female    DOB: May 10, 1961  Age: 63 y.o. MRN: [id number]  Chief Complaint  Patient presents with   Pain Management    Pain management    Here for pain management follow up. Chronic pain well controlled on current analgesia. Last DS satisfactory and pill counts have also been satisfactory.     No other concerns at this time.   Past Medical History:  Diagnosis Date   Abnormal liver enzymes 02/08/2016   Allergy    Anxiety    Chest pain 07/05/2016   Chronic pain    Depression    DJD (degenerative joint disease)    lumbar   Fibromyalgia    High cholesterol    Rash of back 01/28/2016   Weight loss due to medication     Past Surgical History:  Procedure Laterality Date   ANKLE SURGERY     x 2   BREAST BIOPSY Left 1997   CHOLECYSTECTOMY     COLONOSCOPY     COLONOSCOPY WITH PROPOFOL  N/A 02/22/2016   Procedure: COLONOSCOPY WITH PROPOFOL ;  Surgeon: Deveron Fly, MD;  Location: [facility] ENDOSCOPY;  Service: Endoscopy;  Laterality: N/A;   ESOPHAGOGASTRODUODENOSCOPY (EGD) WITH PROPOFOL  N/A 02/22/2016   Procedure: ESOPHAGOGASTRODUODENOSCOPY (EGD) WITH PROPOFOL ;  Surgeon: Deveron Fly, MD;  Location: [facility] ENDOSCOPY;  Service: Endoscopy;  Laterality: N/A;   NASAL SINUS SURGERY     SHOULDER ARTHROSCOPY WITH ROTATOR CUFF REPAIR AND SUBACROMIAL DECOMPRESSION Left 09/23/2019   Procedure: SHOULDER ARTHROSCOPY WITH MINI ROTATOR CUFF REPAIR AND SUBACROMIAL DECOMPRESSION, BICEPS TENODESIS;  Surgeon: Lorri Rota, MD;  Location: [facility];  Service: Orthopedics;  Laterality: Left;   TONSILLECTOMY     TUBAL LIGATION      Social History   Socioeconomic History   Marital status: Legally Separated    Spouse name: Not on file   Number of children: Not on file   Years of education: Not on file   Highest education level: Not on file  Occupational History   Not on file  Tobacco Use   Smoking status:  Every Day    Current packs/day: 0.00    Average packs/day: 1 pack/day for 41.3 years (41.3 ttl pk-yrs)    Types: E-cigarettes, Cigarettes    Start date: 03/06/1974    Last attempt to quit: 07/08/2015    Years since quitting: 8.7   Smokeless tobacco: Never   Tobacco comments:    vapor cigarettes, no nicotene  Vaping Use   Vaping status: Every Day   Start date: 07/08/2015   Substances: Nicotine , Flavoring   Devices: [employer]  Substance and Sexual Activity   Alcohol use: No    Alcohol/week: 0.0 standard drinks of alcohol   Drug use: No   Sexual activity: Not Currently    Birth control/protection: Post-menopausal  Other Topics Concern   Not on file  Social History Narrative   Not on file   Social Drivers of Health   Financial Resource Strain: Not on file  Food Insecurity: Not on file  Transportation Needs: Not on file  Physical Activity: Not on file  Stress: Not on file  Social Connections: Not on file  Intimate Partner Violence: Not on file    Family History  Problem Relation Age of Onset   Heart disease Mother    Stroke Mother    Cancer Father        lung   Arthritis Father    Arthritis  Paternal Grandmother    Arthritis Paternal Grandfather    Drug abuse Sister    Drug abuse Brother    Post-traumatic stress disorder Brother    Diabetes Sister    Dementia Sister    Drug abuse Brother    Cancer Brother     Allergies  Allergen Reactions   Other Hives and Other (See Comments)    Pt states she was tested for allergies and peas was one that she is allergic to but has never ate enough of them to see a reaction. Pt states when she is around bird feathers she gets the hives FEATHERS   Adhesive [Tape] Hives   Food     PEAS/ patient does not know what type reaction she has/ from allergy test during childhood   Statins Other (See Comments)    Severe myalgia   Pea Other (See Comments)    Other reaction(Ayn Domangue): Unknown.  Pt states she was tested for allergies and peas was  one that she is allergic to but has never ate enough of them to see a reaction.    Outpatient Medications Prior to Visit  Medication Sig   Azelastine  HCl 137 MCG/SPRAY SOLN Place 1 Inhalation into the nose daily.   Calcium Carb-Cholecalciferol (CALCIUM 600 + D) 600-5 MG-MCG TABS Take by mouth.   cetirizine  (ZYRTEC ) 10 MG tablet Take 1 tablet (10 mg total) by mouth every morning.   Cholecalciferol (VITAMIN D3) 125 MCG (5000 UT) CAPS Take 1 capsule (5,000 Units total) by mouth daily.   LINZESS  72 MCG capsule TK 1 C PO QD   LORazepam  (ATIVAN ) 1 MG tablet Take 1 tablet (1 mg total) by mouth every 8 (eight) hours as needed. for anxiety   Multiple Vitamin (MULTI-VITAMINS) TABS Take 1 tablet by mouth daily.   nicotine  (NICODERM CQ  - DOSED IN MG/24 HOURS) 14 mg/24hr patch Place 1 patch (14 mg total) onto the skin daily.   Semaglutide -Weight Management (WEGOVY ) 0.5 MG/0.5ML SOAJ Inject 0.5 mg into the skin once a week.   Semaglutide -Weight Management (WEGOVY ) 1 MG/0.5ML SOAJ Inject 1 mg into the skin once a week.   vortioxetine  HBr (TRINTELLIX ) 5 MG TABS tablet TAKE 1 TABLET BY MOUTH THREE TIMES DAILY BEFORE MEAL(Torah Pinnock)   [DISCONTINUED] Bempedoic Acid-Ezetimibe (NEXLIZET ) 180-10 MG TABS Take 1 tablet by mouth daily.   [DISCONTINUED] ibuprofen  (ADVIL ) 800 MG tablet TAKE 1 TABLET BY MOUTH EVERY 8 HOURS AS NEEDED   [DISCONTINUED] magnesium hydroxide (MILK OF MAGNESIA) 400 MG/5ML suspension Take 30 mLs by mouth daily as needed for mild constipation.   [DISCONTINUED] oxyCODONE  (OXY IR/ROXICODONE ) 5 MG immediate release tablet Take 1 tablet (5 mg total) by mouth 2 (two) times daily as needed for severe pain (pain score 7-10). Prescribed by Shari Daughters, MD   No facility-administered medications prior to visit.    ROS     Objective:   BP 120/74   Pulse 70   Temp 97.7 F (36.5 C)   Ht 5' 4 (1.626 m)   Wt 149 lb 9.6 oz (67.9 kg)   SpO2 98%   BMI 25.68 kg/m   Vitals:   04/16/24 0910  BP:  120/74  Pulse: 70  Temp: 97.7 F (36.5 C)  Height: 5' 4 (1.626 m)  Weight: 149 lb 9.6 oz (67.9 kg)  SpO2: 98%  BMI (Calculated): 25.67    Physical Exam   No results found for any visits on 04/16/24.  No results found for this or any previous visit (from the  past 2160 hours).    Assessment & Plan:  As per problem list  Problem List Items Addressed This Visit       Nervous and Auditory   Chronic low back pain (Bilateral) (L>R) (Chronic)   Relevant Medications   ibuprofen  (ADVIL ) 800 MG tablet   oxyCODONE  (OXY IR/ROXICODONE ) 5 MG immediate release tablet    Return in about 1 month (around 05/16/2024).   Total time spent: 20 minutes  Arzella Bitters, MD  04/16/2024   This document may have been prepared by [facility] Voice Recognition software and as such may include unintentional dictation errors.

## 2024-05-01 ENCOUNTER — Other Ambulatory Visit: Payer: Self-pay

## 2024-05-01 MED ORDER — WEGOVY 1.7 MG/0.75ML ~~LOC~~ SOAJ: 1.7000 mg | SUBCUTANEOUS | 0 refills | Status: DC

## 2024-05-07 ENCOUNTER — Emergency Department
Admission: EM | Admit: 2024-05-07 | Discharge: 2024-05-07 | Disposition: A | Discharge status: Home / Self Care | Attending: Emergency Medicine | Admitting: Emergency Medicine

## 2024-05-07 ENCOUNTER — Emergency Department

## 2024-05-07 ENCOUNTER — Other Ambulatory Visit: Payer: Self-pay

## 2024-05-07 ENCOUNTER — Encounter: Payer: Self-pay | Admitting: Emergency Medicine

## 2024-05-07 DIAGNOSIS — K5732 Diverticulitis of large intestine without perforation or abscess without bleeding: Secondary | ICD-10-CM | POA: Diagnosis not present

## 2024-05-07 DIAGNOSIS — K5792 Diverticulitis of intestine, part unspecified, without perforation or abscess without bleeding: Secondary | ICD-10-CM

## 2024-05-07 DIAGNOSIS — R102 Pelvic and perineal pain: Secondary | ICD-10-CM | POA: Diagnosis present

## 2024-05-07 LAB — COMPREHENSIVE METABOLIC PANEL WITH GFR
ALT: 17 U/L (ref 0–44)
AST: 19 U/L (ref 15–41)
Albumin: 3.8 g/dL (ref 3.5–5.0)
Alkaline Phosphatase: 57 U/L (ref 38–126)
Anion gap: 9 (ref 5–15)
BUN: 16 mg/dL (ref 8–23)
CO2: 24 mmol/L (ref 22–32)
Calcium: 9.1 mg/dL (ref 8.9–10.3)
Chloride: 106 mmol/L (ref 98–111)
Creatinine, Ser: 0.77 mg/dL (ref 0.44–1.00)
GFR, Estimated: >60 mL/min (ref >60)
Glucose, Bld: 94 mg/dL (ref 70–99)
Potassium: 3.9 mmol/L (ref 3.5–5.1)
Sodium: 139 mmol/L (ref 135–145)
Total Bilirubin: 0.6 mg/dL (ref 0.0–1.2)
Total Protein: 6.5 g/dL (ref 6.5–8.1)

## 2024-05-07 LAB — URINALYSIS, ROUTINE W REFLEX MICROSCOPIC
Bilirubin Urine: NEGATIVE
Glucose, UA: NEGATIVE mg/dL
Hgb urine dipstick: NEGATIVE
Ketones, ur: NEGATIVE mg/dL
Nitrite: NEGATIVE
Protein, ur: NEGATIVE mg/dL
Specific Gravity, Urine: 1.014 (ref 1.005–1.030)
pH: 5 (ref 5.0–8.0)

## 2024-05-07 LAB — CBC
HCT: 34.8 % — ABNORMAL LOW (ref 36.0–46.0)
Hemoglobin: 11.7 g/dL — ABNORMAL LOW (ref 12.0–15.0)
MCH: 30 pg (ref 26.0–34.0)
MCHC: 33.6 g/dL (ref 30.0–36.0)
MCV: 89.2 fL (ref 80.0–100.0)
Platelets: 270 10*3/uL (ref 150–400)
RBC: 3.9 MIL/uL (ref 3.87–5.11)
RDW: 13.2 % (ref 11.5–15.5)
WBC: 8.4 10*3/uL (ref 4.0–10.5)
nRBC: 0 % (ref 0.0–0.2)

## 2024-05-07 LAB — LIPASE, BLOOD: Lipase: 32 U/L (ref 11–51)

## 2024-05-07 LAB — WET PREP, GENITAL
Clue Cells Wet Prep HPF POC: NONE SEEN
Sperm: NONE SEEN
Trich, Wet Prep: NONE SEEN
WBC, Wet Prep HPF POC: >=10 — AB (ref <10)
Yeast Wet Prep HPF POC: NONE SEEN

## 2024-05-07 MED ORDER — AMOXICILLIN-POT CLAVULANATE 875-125 MG PO TABS: 1.0000 | ORAL_TABLET | Freq: Three times a day (TID) | ORAL | 0 refills | Status: DC

## 2024-05-07 MED ORDER — ONDANSETRON 4 MG PO TBDP
4.0000 mg | ORAL_TABLET | Freq: Once | ORAL | Status: AC
  Administered 2024-05-07: 4 mg via ORAL
  Filled 2024-05-07: qty 1

## 2024-05-07 MED ORDER — AMOXICILLIN-POT CLAVULANATE 875-125 MG PO TABS
1.0000 | ORAL_TABLET | Freq: Once | ORAL | Status: AC
  Administered 2024-05-07: 1 via ORAL
  Filled 2024-05-07: qty 1

## 2024-05-07 MED ORDER — ONDANSETRON 4 MG PO TBDP: 4.0000 mg | ORAL_TABLET | Freq: Three times a day (TID) | ORAL | 0 refills | Status: AC | PRN

## 2024-05-07 NOTE — ED Provider Notes (Signed)
 [facility] Emergency Department Provider Note     Event Date/Time   First MD Initiated Contact with Patient 05/07/24 1647     (approximate)   History   Pelvic Pain   HPI  Nicole Walter is a 63 y.o. female with a history of chronic pain, DJD, fibromyalgia, anxiety, depression, and HLD, presents to the ED endorsing pelvic pain.  She reports onset of symptoms this morning upon awakening.  She describes pelvic pressure and pulling sensation she denies any dysuria, hematuria, urinary retention or frequency.  She also denies any associated nausea, vomiting, or diarrhea.  Patient notes that last week she had a single episode of some pink to slightly blood-tinged spotting on her panty liner last week.  She denies any frank abnormal vaginal bleeding or vaginal discharge.   Physical Exam   Triage Vital Signs: ED Triage Vitals  Encounter Vitals Group     BP 05/07/24 1631 115/89     Girls Systolic BP Percentile --      Girls Diastolic BP Percentile --      Boys Systolic BP Percentile --      Boys Diastolic BP Percentile --      Pulse Rate 05/07/24 1631 64     Resp 05/07/24 1631 17     Temp 05/07/24 1631 97.9 F (36.6 C)     Temp Source 05/07/24 1631 Oral     SpO2 05/07/24 1631 99 %     Weight 05/07/24 1631 149 lb 9.6 oz (67.9 kg)     Height 05/07/24 1631 5' 4 (1.626 m)     Head Circumference --      Peak Flow --      Pain Score 05/07/24 1634 3     Pain Loc --      Pain Education --      Exclude from Growth Chart --     Most recent vital signs: Vitals:   05/07/24 1631 05/07/24 1900  BP: 115/89 119/76  Pulse: 64 66  Resp: 17 18  Temp: 97.9 F (36.6 C)   SpO2: 99% 98%    General Awake, no distress. NAD HEENT NCAT. PERRL. EOMI. No rhinorrhea. Mucous membranes are moist.  CV:  Good peripheral perfusion.  RESP:  Normal effort. CTA ABD:  No distention.  No rebound, guarding, rigidity noted.  No CVA tenderness elicited GYN:  Normal external  genitalia.  No CMT or adnexal masses noted.   ED Results / Procedures / Treatments   Labs (all labs ordered are listed, but only abnormal results are displayed)  .lab  EKG   RADIOLOGY  I personally viewed and evaluated these images as part of my medical decision making, as well as reviewing the written report by the radiologist.  ED Provider Interpretation: Sigmoid diverticulitis without evidence of abscess or perforation  CT Renal Stone Study  IMPRESSION: 1. Acute sigmoid diverticulitis. No diverticular abscess or perforation. 2. No urinary tract calculi. No bowel obstruction. Normal appendix. 3.  Aortic Atherosclerosis (ICD10-I70.0).  PROCEDURES:  Critical Care performed: No  Procedures   MEDICATIONS ORDERED IN ED: Medications  ondansetron  (ZOFRAN -ODT) disintegrating tablet 4 mg (4 mg Oral Given 05/07/24 1858)  amoxicillin -clavulanate (AUGMENTIN ) 875-125 MG per tablet 1 tablet (1 tablet Oral Given 05/07/24 1858)     IMPRESSION / MDM / ASSESSMENT AND PLAN / ED COURSE  I reviewed the triage vital signs and the nursing notes.  Differential diagnosis includes, but is not limited to, Differential diagnosis includes, but is not limited to, ovarian cyst, ovarian torsion, acute appendicitis, diverticulitis, urinary tract infection/pyelonephritis, endometriosis, bowel obstruction, colitis, renal colic, gastroenteritis, hernia, fibroids, pregnancy related pain including ectopic pregnancy, etc.  Patient's presentation is most consistent with acute presentation with potential threat to life or bodily function.  Patient's diagnosis is consistent with acute uncomplicated diverticulitis.  Patient presents in no acute distress, afebrile, with reassuring vital signs.  No reports of any intractable nausea, vomiting, or bowel changes.  Patient with nonspecific pelvic discomfort likely related to her recent diagnosis of diverticulitis.  Labs are reassuring  without evidence of acute leukocytosis or critical anemia.  UA reveals rare bacteria and urine culture is pending at this time.  Wet prep performed by me, shows no evidence of vaginitis.  CT renal stone does confirm the diverticulitis without evidence of abscess or perforation, based on my interpretation of images.  Patient is stable at this time, and while admission has been considered, she is stable for outpatient management and is agreeable to that at this time, declining admission for ongoing management.  I feel this is appropriate for this patient given her presentation. Patient will be discharged home with prescriptions for Augmentin  and Zofran . Patient is to follow up with her specialist tomorrow as scheduled as well as her primary provider as suggested, as needed or otherwise directed. Patient is given ED precautions to return to the ED for any worsening or new symptoms.    FINAL CLINICAL IMPRESSION(S) / ED DIAGNOSES   Final diagnoses:  Diverticulitis     Rx / DC Orders   ED Discharge Orders          Ordered    amoxicillin -clavulanate (AUGMENTIN ) 875-125 MG tablet  3 times daily        05/07/24 1853    ondansetron  (ZOFRAN -ODT) 4 MG disintegrating tablet  Every 8 hours PRN        05/07/24 1853             Note:  This document was prepared using Dragon voice recognition software and may include unintentional dictation errors.    Loyd Candida LULLA Aldona, PA-C 05/11/24 0001    Viviann Pastor, MD 05/13/24 [phone]

## 2024-05-07 NOTE — Discharge Instructions (Addendum)
 Your exam and labs overall normal and reassuring.  Your CT scan did confirm mild sigmoid diverticulitis.  There is no evidence of any abscess or perforation of your colon.  Take the antibiotic as directed.  Take the nausea medicine as needed.  You may take OTC pain medicine as necessary.  You can start a liquid diet and advance as tolerated.  Follow-up with your primary provider for ongoing evaluation.  Consider telephone follow-up with your GI provider.  Return to the ED for worsening symptoms as discussed.

## 2024-05-07 NOTE — ED Triage Notes (Signed)
 Patient to ED via [facility] for pelvic pain. Started this AM upon waking. PT states in the last 1hr increased pressure. Denies burning with urination or N/V/D.

## 2024-05-08 ENCOUNTER — Ambulatory Visit
Attending: Student in an Organized Health Care Education/Training Program | Admitting: Student in an Organized Health Care Education/Training Program

## 2024-05-08 ENCOUNTER — Telehealth: Payer: Self-pay | Admitting: Emergency Medicine

## 2024-05-08 ENCOUNTER — Encounter: Payer: Self-pay | Admitting: Student in an Organized Health Care Education/Training Program

## 2024-05-08 VITALS — BP 119/77 | HR 64 | Temp 97.2°F | Resp 16 | Ht 64.0 in | Wt 150.0 lb

## 2024-05-08 DIAGNOSIS — G8929 Other chronic pain: Secondary | ICD-10-CM | POA: Diagnosis present

## 2024-05-08 DIAGNOSIS — M5416 Radiculopathy, lumbar region: Secondary | ICD-10-CM | POA: Diagnosis present

## 2024-05-08 DIAGNOSIS — M542 Cervicalgia: Secondary | ICD-10-CM | POA: Insufficient documentation

## 2024-05-08 DIAGNOSIS — M1611 Unilateral primary osteoarthritis, right hip: Secondary | ICD-10-CM | POA: Insufficient documentation

## 2024-05-08 DIAGNOSIS — G894 Chronic pain syndrome: Secondary | ICD-10-CM | POA: Diagnosis present

## 2024-05-08 DIAGNOSIS — M5412 Radiculopathy, cervical region: Secondary | ICD-10-CM | POA: Diagnosis present

## 2024-05-08 LAB — URINE CULTURE

## 2024-05-08 MED ORDER — FLUCONAZOLE 150 MG PO TABS: 150.0000 mg | ORAL_TABLET | ORAL | 0 refills | Status: AC

## 2024-05-08 NOTE — Progress Notes (Signed)
 PROVIDER NOTE: Interpretation of information contained herein should be left to medically-trained personnel. Specific patient instructions are provided elsewhere under Patient Instructions section of medical record. This document was created in part using AI and STT-dictation technology, any transcriptional errors that may result from this process are unintentional.  Patient: Nicole Walter  Service: E/M   PCP: Albina GORMAN Dine, MD  DOB: 1961/01/29  DOS: 05/08/2024  Provider: Wallie Sherry, MD  MRN: [id number]  Delivery: Face-to-face  Specialty: Interventional Pain Management  Type: Established Patient  Setting: Ambulatory outpatient facility  Specialty designation: 09  Referring Prov.: Albina GORMAN Dine, MD  Location: Outpatient office facility       History of present illness (HPI) Nicole Walter, a 63 y.o. year old female, is here today because of her Cervical radicular pain [M54.12]. Nicole Walter primary complain today is Neck Pain, Back Pain, and Hip Pain  Pertinent problems: Nicole Walter does not have any pertinent problems on file.  Pain Assessment: Severity of Chronic pain is reported as a 2  (neck/2; right hip/4)/10. Location: Neck Posterior, Right/denies. Onset: More than a month ago. Quality: [place]. Timing: Constant. Modifying factor(s): ice, OTC. Vitals:  height is 5' 4 (1.626 m) and weight is 150 lb (68 kg). Her temperature is 97.2 F (36.2 C) (abnormal). Her blood pressure is 119/77 and her pulse is 64. Her respiration is 16 and oxygen saturation is 100%.  BMI: Estimated body mass index is 25.75 kg/m as calculated from the following:   Height as of this encounter: 5' 4 (1.626 m).   Weight as of this encounter: 150 lb (68 kg).  Last encounter: 12/05/2023. Last procedure: 04/07/2024.  Reason for encounter:   History of Present Illness   Nicole Walter is a 63 year old female with diverticulosis who presents with pelvic pain and suspected diverticulitis.  She  experiences pain above her pelvic bone, described as a pulling sensation. She visited the emergency department yesterday and was diagnosed with diverticulitis. A culture was taken to check for bacterial infection. Her history of diverticulosis was diagnosed in 2017.  She has ongoing pain in her right hip, which worsens with weight-bearing activities. She has tried anti-inflammatories and received trigger point injections with minimal relief. She has not engaged in specific physical therapy for her hip.  She experiences neck pain with a pinching sensation when looking up or to the side, with radiation to her right hand. Previous trigger point injections provided minimal relief.        UDS:  Summary  Date Value Ref Range Status  08/19/2018 FINAL  Final    Comment:    ==================================================================== TOXASSURE SELECT 13 (MW) ==================================================================== Test                             Result       Flag       Units Drug Present and Declared for Prescription Verification   Lorazepam                       1903         EXPECTED   ng/mg creat    Source of lorazepam  is a scheduled prescription medication.   Oxycodone                       127          EXPECTED   ng/mg creat   Oxymorphone  395          EXPECTED   ng/mg creat   Noroxycodone                   1203         EXPECTED   ng/mg creat   Noroxymorphone                 265          EXPECTED   ng/mg creat    Sources of oxycodone  are scheduled prescription medications.    Oxymorphone, noroxycodone, and noroxymorphone are expected    metabolites of oxycodone . Oxymorphone is also available as a    scheduled prescription medication. ==================================================================== Test                      Result    Flag   Units      Ref Range   Creatinine              98               mg/dL       >=79 ==================================================================== Declared Medications:  The flagging and interpretation on this report are based on the  following declared medications.  Unexpected results may arise from  inaccuracies in the declared medications.  **Note: The testing scope of this panel includes these medications:  Lorazepam  (Ativan )  Oxycodone  (Roxicodone )  **Note: The testing scope of this panel does not include following  reported medications:  Fluticasone (Flonase)  Gabapentin   Ibuprofen   Linaclotide  (Linzess )  Multivitamin  Naloxone  (Narcan )  Omega-3 Fatty Acids (Fish Oil)  Rabeprazole (Aciphex)  Sucralfate (Carafate)  Topical (Lidex )  Vortioxetine  (Trintellix ) ==================================================================== For clinical consultation, please call [phone]. ====================================================================     No results found for: CBDTHCR No results found for: D8THCCBX No results found for: D9THCCBX  ROS  Constitutional: Denies any fever or chills Gastrointestinal: No reported hemesis, hematochezia, vomiting, or acute GI distress Musculoskeletal: hip pain, right Neurological: No reported episodes of acute onset apraxia, aphasia, dysarthria, agnosia, amnesia, paralysis, loss of coordination, or loss of consciousness  Medication Review  Azelastine  HCl, Calcium Carb-Cholecalciferol, LORazepam , Multi-Vitamins, Semaglutide -Weight Management, amoxicillin -clavulanate, cetirizine , ibuprofen , linaclotide , nicotine , ondansetron , oxyCODONE , and vortioxetine  HBr  History Review  Allergy: Nicole Walter is allergic to other, adhesive [tape], food, statins, and pea. Drug: Nicole Walter  reports no history of drug use. Alcohol:  reports no history of alcohol use. Tobacco:  reports that she has been smoking e-cigarettes and cigarettes. She started smoking about 50 years ago. She has a 41.3 pack-year smoking history.  She has never used smokeless tobacco. Social: Nicole Walter  reports that she has been smoking e-cigarettes and cigarettes. She started smoking about 50 years ago. She has a 41.3 pack-year smoking history. She has never used smokeless tobacco. She reports that she does not drink alcohol and does not use drugs. Medical:  has a past medical history of Abnormal liver enzymes (02/08/2016), Allergy, Anxiety, Chest pain (07/05/2016), Chronic pain, Depression, DJD (degenerative joint disease), Fibromyalgia, High cholesterol, Rash of back (01/28/2016), and Weight loss due to medication. Surgical: Nicole Walter  has a past surgical history that includes Cholecystectomy; Tubal ligation; Ankle surgery; Nasal sinus surgery; Tonsillectomy; Colonoscopy; Esophagogastroduodenoscopy (egd) with propofol  (N/A, 02/22/2016); Colonoscopy with propofol  (N/A, 02/22/2016); Breast biopsy (Left, 1997); and Shoulder arthroscopy with rotator cuff repair and subacromial decompression (Left, 09/23/2019). Family: family history includes Arthritis in her father, paternal grandfather, and paternal grandmother; Cancer in  her brother and father; Dementia in her sister; Diabetes in her sister; Drug abuse in her brother, brother, and sister; Heart disease in her mother; Post-traumatic stress disorder in her brother; Stroke in her mother.  Laboratory Chemistry Profile   Renal Lab Results  Component Value Date   BUN 16 05/07/2024   CREATININE 0.77 05/07/2024   BCR 20 12/18/2023   GFR 86.13 10/08/2018   GFRAA >60 12/07/2018   GFRNONAA >60 05/07/2024    Hepatic Lab Results  Component Value Date   AST 19 05/07/2024   ALT 17 05/07/2024   ALBUMIN 3.8 05/07/2024   ALKPHOS 57 05/07/2024   HCVAB NEGATIVE 02/14/2017   LIPASE 32 05/07/2024    Electrolytes Lab Results  Component Value Date   NA 139 05/07/2024   K 3.9 05/07/2024   CL 106 05/07/2024   CALCIUM 9.1 05/07/2024   MG 2.2 05/15/2017    Bone Lab Results  Component Value Date   VD25OH  49.0 12/18/2023   25OHVITD1 58 07/20/2016   25OHVITD2 <1.0 07/20/2016   25OHVITD3 58 07/20/2016    Inflammation (CRP: Acute Phase) (ESR: Chronic Phase) Lab Results  Component Value Date   CRP 2.1 (H) 07/20/2016   ESRSEDRATE 2 05/15/2017         Note: Above Lab results reviewed.  Recent Imaging Review  CT Renal Stone Study CLINICAL DATA:  Abdominal/flank pain.  Concern for kidney stone.  EXAM: CT ABDOMEN AND PELVIS WITHOUT CONTRAST  TECHNIQUE: Multidetector CT imaging of the abdomen and pelvis was performed following the standard protocol without IV contrast.  RADIATION DOSE REDUCTION: This exam was performed according to the departmental dose-optimization program which includes automated exposure control, adjustment of the mA and/or kV according to patient size and/or use of iterative reconstruction technique.  COMPARISON:  None Available.  FINDINGS: Evaluation of this exam is limited in the absence of intravenous contrast.  Lower chest: The visualized lung bases are clear.  No intra-abdominal free air or free fluid.  Hepatobiliary: The liver is unremarkable. No biliary dilatation. Cholecystectomy.  Pancreas: Unremarkable. No pancreatic ductal dilatation or surrounding inflammatory changes.  Spleen: Normal in size without focal abnormality.  Adrenals/Urinary Tract: The adrenal glands unremarkable. The kidneys, visualized ureters, and urinary bladder appear unremarkable.  Stomach/Bowel: There is sigmoid diverticulosis and scattered colonic diverticula. There is inflammatory changes adjacent to the sigmoid colon consistent with acute diverticulitis. No diverticular abscess or perforation. There is no bowel obstruction. The appendix is normal.  Vascular/Lymphatic: Mild atherosclerotic calcification of the abdominal aorta the IVC is unremarkable. No portal venous gas. There is no adenopathy.  Reproductive: The uterus is grossly unremarkable. No  suspicious adnexal masses.  Other: None  Musculoskeletal: Degenerative changes of the spine. No acute osseous pathology.  IMPRESSION: 1. Acute sigmoid diverticulitis. No diverticular abscess or perforation. 2. No urinary tract calculi. No bowel obstruction. Normal appendix. 3.  Aortic Atherosclerosis (ICD10-I70.0).  Electronically Signed   By: Vanetta Chou M.D.   On: 05/07/2024 18:34  Narrative & Impression  CLINICAL DATA:  Right hip pain and arthralgia.   EXAM: DG HIP (WITH OR WITHOUT PELVIS) 2-3V RIGHT   COMPARISON:  MRI right hip 02/07/2017   FINDINGS: No acute fracture or dislocation. Mild to moderate degenerative arthritis of both hips.   IMPRESSION: Mild to moderate degenerative arthritis of both hips.     Electronically Signed   By: Norman Gatlin M.D.   On: 04/13/2024 23:52      Note: Reviewed  Physical Exam  Vitals: BP 119/77   Pulse 64   Temp (!) 97.2 F (36.2 C)   Resp 16   Ht 5' 4 (1.626 m)   Wt 150 lb (68 kg)   SpO2 100%   BMI 25.75 kg/m  BMI: Estimated body mass index is 25.75 kg/m as calculated from the following:   Height as of this encounter: 5' 4 (1.626 m).   Weight as of this encounter: 150 lb (68 kg). Ideal: Ideal body weight: 54.7 kg (120 lb 9.5 oz) Adjusted ideal body weight: 60 kg (132 lb 5.7 oz) General appearance: Well nourished, well developed, and well hydrated. In no apparent acute distress Mental status: Alert, oriented x 3 (person, place, & time)       Respiratory: No evidence of acute respiratory distress Eyes: PERLA  Cervicalgia RIGHT hip pain, worse with WB   Assessment   Diagnosis  1. Cervical radicular pain   2. Cervicalgia   3. Chronic radicular lumbar pain   4. Chronic pain syndrome   5. Primary osteoarthritis of right hip      Updated Problems: No problems updated.  Plan of Care  Problem-specific:  Assessment and Plan    Cervical radiculopathy   She experiences cervical  radiculopathy with neck pinching, especially when looking up or to the side, causing discomfort in the right hand. Previous superficial treatments have been minimally effective. Consider an epidural injection if symptoms persist.  Osteoarthritis of right hip   Moderate osteoarthritis in the right hip causes pain when weight-bearing. Previous treatments include anti-inflammatories and a steroid injection, with the last injection in 2017. She has not undertaken specific physical therapy for the hip. Refer to physical therapy for hip management and consider repeating the steroid injection if symptoms persist.  Diverticulitis   She has a recent diagnosis of diverticulitis with pain above the pelvic bone and a sensation of pulling. She has had diverticulosis since 2017. Awaiting results of a bacterial culture to rule out infection.       Nicole Walter has a current medication list which includes the following long-term medication(s): azelastine  hcl, calcium carb-cholecalciferol, cetirizine , and linzess .  Pharmacotherapy (Medications Ordered): No orders of the defined types were placed in this encounter.  Orders:  Orders Placed This Encounter  Procedures   Ambulatory referral to Physical Therapy    Referral Priority:   Routine    Referral Type:   Physical Medicine    Referral Reason:   Specialty Services Required    Requested Specialty:   Physical Therapy    Number of Visits Requested:   1    Return if symptoms worsen or fail to improve.    Recent Visits Date Type Provider Dept  04/07/24 Procedure visit Marcelino Nurse, MD [facility]  Showing recent visits within past 90 days and meeting all other requirements Today's Visits Date Type Provider Dept  05/08/24 Office Visit Marcelino Nurse, MD [facility]  Showing today's visits and meeting all other requirements Future Appointments No visits were found meeting these conditions. Showing future appointments within  next 90 days and meeting all other requirements  I discussed the assessment and treatment plan with the patient. The patient was provided an opportunity to ask questions and all were answered. The patient agreed with the plan and demonstrated an understanding of the instructions.  Patient advised to call back or seek an in-person evaluation if the symptoms or condition worsens.  Duration of encounter: .  Total time on encounter,  as per AMA guidelines included both the face-to-face and non-face-to-face time personally spent by the physician and/or other qualified health care professional(s) on the day of the encounter (includes time in activities that require the physician or other qualified health care professional and does not include time in activities normally performed by clinical staff). Physician's time may include the following activities when performed: Preparing to see the patient (e.g., pre-charting review of records, searching for previously ordered imaging, lab work, and nerve conduction tests) Review of prior analgesic pharmacotherapies. Reviewing PMP Interpreting ordered tests (e.g., lab work, imaging, nerve conduction tests) Performing post-procedure evaluations, including interpretation of diagnostic procedures Obtaining and/or reviewing separately obtained history Performing a medically appropriate examination and/or evaluation Counseling and educating the patient/family/caregiver Ordering medications, tests, or procedures Referring and communicating with other health care professionals (when not separately reported) Documenting clinical information in the electronic or other health record Independently interpreting results (not separately reported) and communicating results to the patient/ family/caregiver Care coordination (not separately reported)  Note by: Wallie Sherry, MD (TTS and AI technology used. I apologize for any typographical errors that were not detected  and corrected.) Date: 05/08/2024; Time: 9:29 AM

## 2024-05-08 NOTE — Telephone Encounter (Signed)
 Needs diflucan rx

## 2024-05-08 NOTE — Progress Notes (Signed)
 Safety precautions to be maintained throughout the outpatient stay will include: orient to surroundings, keep bed in low position, maintain call bell within reach at all times, provide assistance with transfer out of bed and ambulation.

## 2024-05-09 ENCOUNTER — Emergency Department

## 2024-05-09 ENCOUNTER — Other Ambulatory Visit: Payer: Self-pay

## 2024-05-09 ENCOUNTER — Emergency Department
Admission: EM | Admit: 2024-05-09 | Discharge: 2024-05-09 | Disposition: A | Discharge status: Home / Self Care | Attending: Emergency Medicine | Admitting: Emergency Medicine

## 2024-05-09 DIAGNOSIS — K5732 Diverticulitis of large intestine without perforation or abscess without bleeding: Secondary | ICD-10-CM | POA: Insufficient documentation

## 2024-05-09 DIAGNOSIS — K5792 Diverticulitis of intestine, part unspecified, without perforation or abscess without bleeding: Secondary | ICD-10-CM

## 2024-05-09 DIAGNOSIS — R1030 Lower abdominal pain, unspecified: Secondary | ICD-10-CM | POA: Diagnosis present

## 2024-05-09 LAB — HEPATIC FUNCTION PANEL
ALT: 15 U/L (ref 0–44)
AST: 20 U/L (ref 15–41)
Albumin: 3.7 g/dL (ref 3.5–5.0)
Alkaline Phosphatase: 61 U/L (ref 38–126)
Bilirubin, Direct: <0.1 mg/dL (ref 0.0–0.2)
Total Bilirubin: 0.5 mg/dL (ref 0.0–1.2)
Total Protein: 6.7 g/dL (ref 6.5–8.1)

## 2024-05-09 LAB — BASIC METABOLIC PANEL WITH GFR
Anion gap: 10 (ref 5–15)
BUN: 16 mg/dL (ref 8–23)
CO2: 25 mmol/L (ref 22–32)
Calcium: 9 mg/dL (ref 8.9–10.3)
Chloride: 107 mmol/L (ref 98–111)
Creatinine, Ser: 0.77 mg/dL (ref 0.44–1.00)
GFR, Estimated: >60 mL/min (ref >60)
Glucose, Bld: 104 mg/dL — ABNORMAL HIGH (ref 70–99)
Potassium: 3.9 mmol/L (ref 3.5–5.1)
Sodium: 142 mmol/L (ref 135–145)

## 2024-05-09 LAB — URINALYSIS, ROUTINE W REFLEX MICROSCOPIC
Bilirubin Urine: NEGATIVE
Glucose, UA: NEGATIVE mg/dL
Hgb urine dipstick: NEGATIVE
Ketones, ur: NEGATIVE mg/dL
Nitrite: NEGATIVE
Protein, ur: NEGATIVE mg/dL
Specific Gravity, Urine: 1.015 (ref 1.005–1.030)
pH: 7 (ref 5.0–8.0)

## 2024-05-09 LAB — CBC
HCT: 38.2 % (ref 36.0–46.0)
Hemoglobin: 12.5 g/dL (ref 12.0–15.0)
MCH: 29.6 pg (ref 26.0–34.0)
MCHC: 32.7 g/dL (ref 30.0–36.0)
MCV: 90.3 fL (ref 80.0–100.0)
Platelets: 268 K/uL (ref 150–400)
RBC: 4.23 MIL/uL (ref 3.87–5.11)
RDW: 13 % (ref 11.5–15.5)
WBC: 5.7 K/uL (ref 4.0–10.5)
nRBC: 0 % (ref 0.0–0.2)

## 2024-05-09 LAB — LIPASE, BLOOD: Lipase: 36 U/L (ref 11–51)

## 2024-05-09 MED ORDER — IOHEXOL 300 MG/ML  SOLN
100.0000 mL | Freq: Once | INTRAMUSCULAR | Status: AC | PRN
  Administered 2024-05-09: 100 mL via INTRAVENOUS

## 2024-05-09 MED ORDER — ONDANSETRON HCL 4 MG/2ML IJ SOLN
4.0000 mg | INTRAMUSCULAR | Status: AC
  Administered 2024-05-09: 4 mg via INTRAVENOUS
  Filled 2024-05-09: qty 2

## 2024-05-09 MED ORDER — MORPHINE SULFATE (PF) 4 MG/ML IV SOLN
4.0000 mg | Freq: Once | INTRAVENOUS | Status: AC
  Administered 2024-05-09: 4 mg via INTRAVENOUS
  Filled 2024-05-09: qty 1

## 2024-05-09 NOTE — ED Triage Notes (Signed)
 Pt was here on 7/2 and placed on antibiotics for diverticulitis, states they're not helping. States got a message in [place] saying some of her results were abnormal and she was anemic.  States continues to have pain above pubic bone and pain is moving toward R side and also having lower back pain (bilateral). States feels like something pulling to suprapubic area.

## 2024-05-09 NOTE — ED Provider Notes (Signed)
-----------------------------------------   4:41 PM on 05/09/2024 -----------------------------------------  CT shows no significant changes from 2 days prior.  Lab workup is also stable.  IMPRESSION:  1. Mild diverticulitis at the junction of the descending colon and  sigmoid colon. No perforation or abscess. No significant change from  2 days prior.  2. Postcholecystectomy.   I did consider whether this patient may benefit from inpatient admission, although I would not consider this failed outpatient therapy given that there are no worsened CT findings, no perforation or abscess, and her other markers such as WBC count are also stable.  I discussed this with the patient.  She feels comfortable going home.  I recommend continuing the antibiotics and pain medication for this uncomplicated diverticulitis.  I did discuss strict return precautions with the patient, she expressed understanding.  She is in agreement with the plan.   Jacolyn Pae, MD 05/09/24 [phone]

## 2024-05-09 NOTE — Discharge Instructions (Signed)
 Your CT scan and lab work show that the diverticulitis appears stable.  Continue doing what you are doing; take the antibiotic and finish the full course.  You may take the pain medication and nausea medication as needed.  Return to the ER for new, worsening, or persistent severe pain, nausea or vomiting, inability hold anything down, fever, blood in the stool, weakness, or any other new or worsening symptoms that concern you.  Follow-up with your primary care provider.

## 2024-05-12 LAB — URINE CULTURE: Culture: 20000 — AB

## 2024-05-16 ENCOUNTER — Ambulatory Visit: Admitting: Internal Medicine

## 2024-05-16 ENCOUNTER — Encounter: Payer: Self-pay | Admitting: Internal Medicine

## 2024-05-16 VITALS — BP 117/81 | HR 70 | Temp 97.8°F | Ht 64.0 in | Wt 146.8 lb

## 2024-05-16 DIAGNOSIS — G8929 Other chronic pain: Secondary | ICD-10-CM

## 2024-05-16 DIAGNOSIS — M5442 Lumbago with sciatica, left side: Secondary | ICD-10-CM | POA: Diagnosis not present

## 2024-05-16 DIAGNOSIS — J302 Other seasonal allergic rhinitis: Secondary | ICD-10-CM

## 2024-05-16 DIAGNOSIS — K5792 Diverticulitis of intestine, part unspecified, without perforation or abscess without bleeding: Secondary | ICD-10-CM | POA: Diagnosis not present

## 2024-05-16 DIAGNOSIS — F32A Depression, unspecified: Secondary | ICD-10-CM

## 2024-05-16 DIAGNOSIS — F419 Anxiety disorder, unspecified: Secondary | ICD-10-CM

## 2024-05-16 DIAGNOSIS — M5441 Lumbago with sciatica, right side: Secondary | ICD-10-CM

## 2024-05-16 DIAGNOSIS — G894 Chronic pain syndrome: Secondary | ICD-10-CM

## 2024-05-16 DIAGNOSIS — Z0130 Encounter for examination of blood pressure without abnormal findings: Secondary | ICD-10-CM

## 2024-05-16 MED ORDER — METRONIDAZOLE 500 MG PO TABS: 500.0000 mg | ORAL_TABLET | Freq: Three times a day (TID) | ORAL | 0 refills | Status: AC

## 2024-05-16 MED ORDER — AZELASTINE HCL 137 MCG/SPRAY NA SOLN: 1.0000 | Freq: Every day | NASAL | 2 refills | Status: AC

## 2024-05-16 MED ORDER — LORAZEPAM 1 MG PO TABS: 1.0000 mg | ORAL_TABLET | Freq: Three times a day (TID) | ORAL | 1 refills | Status: DC | PRN

## 2024-05-16 MED ORDER — OXYCODONE HCL 5 MG PO TABS: 5.0000 mg | ORAL_TABLET | Freq: Two times a day (BID) | ORAL | 0 refills | Status: DC | PRN

## 2024-05-16 MED ORDER — CETIRIZINE HCL 10 MG PO TABS: 10.0000 mg | ORAL_TABLET | Freq: Every morning | ORAL | 5 refills | Status: DC

## 2024-05-16 MED ORDER — CIPROFLOXACIN HCL 500 MG PO TABS: 500.0000 mg | ORAL_TABLET | Freq: Two times a day (BID) | ORAL | 0 refills | Status: AC

## 2024-05-16 NOTE — Progress Notes (Signed)
 Established Patient Office Visit  Subjective:  Patient ID: Nicole Walter, female    DOB: February 19, 1961  Age: 63 y.o. MRN: [id number]  Chief Complaint  Patient presents with   Pain Management    Here for pain management follow up. Chronic pain well controlled on current analgesia. Last UDS satisfactory and pill counts have also been satisfactory. Currently on antibiotics for acute diverticulitis but still c/o suprapubic pain, of note ua and c/Jerrik Housholder onf 7/7 were negative for UTI.     No other concerns at this time.   Past Medical History:  Diagnosis Date   Abnormal liver enzymes 02/08/2016   Allergy    Anxiety    Chest pain 07/05/2016   Chronic pain    Depression    DJD (degenerative joint disease)    lumbar   Fibromyalgia    High cholesterol    Rash of back 01/28/2016   Weight loss due to medication     Past Surgical History:  Procedure Laterality Date   ANKLE SURGERY     x 2   BREAST BIOPSY Left 1997   CHOLECYSTECTOMY     COLONOSCOPY     COLONOSCOPY WITH PROPOFOL  N/A 02/22/2016   Procedure: COLONOSCOPY WITH PROPOFOL ;  Surgeon: Gladis RAYMOND Mariner, MD;  Location: [facility] ENDOSCOPY;  Service: Endoscopy;  Laterality: N/A;   ESOPHAGOGASTRODUODENOSCOPY (EGD) WITH PROPOFOL  N/A 02/22/2016   Procedure: ESOPHAGOGASTRODUODENOSCOPY (EGD) WITH PROPOFOL ;  Surgeon: Gladis RAYMOND Mariner, MD;  Location: [facility] ENDOSCOPY;  Service: Endoscopy;  Laterality: N/A;   NASAL SINUS SURGERY     SHOULDER ARTHROSCOPY WITH ROTATOR CUFF REPAIR AND SUBACROMIAL DECOMPRESSION Left 09/23/2019   Procedure: SHOULDER ARTHROSCOPY WITH MINI ROTATOR CUFF REPAIR AND SUBACROMIAL DECOMPRESSION, BICEPS TENODESIS;  Surgeon: Tobie Priest, MD;  Location: [facility]ERY [facility];  Service: Orthopedics;  Laterality: Left;   TONSILLECTOMY     TUBAL LIGATION      Social History   Socioeconomic History   Marital status: Legally Separated    Spouse name: Not on file   Number of children: Not on file   Years of education: Not on file    Highest education level: Not on file  Occupational History   Not on file  Tobacco Use   Smoking status: Former    Current packs/day: 0.00    Average packs/day: 1 pack/day for 41.3 years (41.3 ttl pk-yrs)    Types: E-cigarettes, Cigarettes    Start date: 03/06/1974    Quit date: 07/08/2015    Years since quitting: 8.8   Smokeless tobacco: Never   Tobacco comments:    vapor cigarettes, no nicotene  Vaping Use   Vaping status: Every Day   Start date: 07/08/2015   Substances: Nicotine , Flavoring   Devices: Nort, Geek  Substance and Sexual Activity   Alcohol use: No    Alcohol/week: 0.0 standard drinks of alcohol   Drug use: No   Sexual activity: Not Currently    Birth control/protection: Post-menopausal  Other Topics Concern   Not on file  Social History Narrative   Not on file   Social Drivers of Health   Financial Resource Strain: Not on file  Food Insecurity: Not on file  Transportation Needs: Not on file  Physical Activity: Not on file  Stress: Not on file  Social Connections: Not on file  Intimate Partner Violence: Not on file    Family History  Problem Relation Age of Onset   Heart disease Mother    Stroke Mother    Cancer Father  lung   Arthritis Father    Arthritis Paternal Grandmother    Arthritis Paternal Grandfather    Drug abuse Sister    Drug abuse Brother    Post-traumatic stress disorder Brother    Diabetes Sister    Dementia Sister    Drug abuse Brother    Cancer Brother     Allergies  Allergen Reactions   Other Hives and Other (See Comments)    Pt states she was tested for allergies and peas was one that she is allergic to but has never ate enough of them to see a reaction. Pt states when she is around bird feathers she gets the hives FEATHERS   Adhesive [Tape] Hives   Food     PEAS/ patient does not know what type reaction she has/ from allergy test during childhood   Statins Other (See Comments)    Severe myalgia   Pea Other (See  Comments)    Other reaction(Eian Vandervelden): Unknown.  Pt states she was tested for allergies and peas was one that she is allergic to but has never ate enough of them to see a reaction.    Outpatient Medications Prior to Visit  Medication Sig   amoxicillin -clavulanate (AUGMENTIN ) 875-125 MG tablet Take 1 tablet by mouth 3 (three) times daily for 10 days.   Azelastine  HCl 137 MCG/SPRAY SOLN Place 1 Inhalation into the nose daily.   Calcium Carb-Cholecalciferol (CALCIUM 600 + D) 600-5 MG-MCG TABS Take by mouth.   cetirizine  (ZYRTEC ) 10 MG tablet Take 1 tablet (10 mg total) by mouth every morning.   fluconazole  (DIFLUCAN ) 150 MG tablet Take 1 tablet (150 mg total) by mouth 3 (three) times a week for 14 days. May discontinue when symptoms resolve.   ibuprofen  (ADVIL ) 800 MG tablet Take 1 tablet (800 mg total) by mouth every 8 (eight) hours as needed.   LINZESS  72 MCG capsule TK 1 C PO QD   LORazepam  (ATIVAN ) 1 MG tablet Take 1 tablet (1 mg total) by mouth every 8 (eight) hours as needed. for anxiety   Multiple Vitamin (MULTI-VITAMINS) TABS Take 1 tablet by mouth daily.   ondansetron  (ZOFRAN -ODT) 4 MG disintegrating tablet Take 1 tablet (4 mg total) by mouth every 8 (eight) hours as needed for nausea or vomiting.   oxyCODONE  (OXY IR/ROXICODONE ) 5 MG immediate release tablet Take 1 tablet (5 mg total) by mouth 2 (two) times daily as needed for severe pain (pain score 7-10). Prescribed by Albina GORMAN Dine, MD   Semaglutide -Weight Management (WEGOVY ) 1.7 MG/0.75ML SOAJ Inject 1.7 mg into the skin once a week.   vortioxetine  HBr (TRINTELLIX ) 5 MG TABS tablet TAKE 1 TABLET BY MOUTH THREE TIMES DAILY BEFORE MEAL(Gabreille Dardis)   nicotine  (NICODERM CQ  - DOSED IN MG/24 HOURS) 14 mg/24hr patch Place 1 patch (14 mg total) onto the skin daily. (Patient not taking: Reported on 05/16/2024)   No facility-administered medications prior to visit.    Review of Systems  Constitutional:  Positive for weight loss (6 lbs).  HENT:  Negative.    Eyes: Negative.   Cardiovascular: Negative.   Gastrointestinal: Negative.   Genitourinary: Negative.        Urge incontinence  Musculoskeletal:  Positive for back pain, joint pain and neck pain.       As in hpi  Skin: Negative.   Neurological: Negative.   Endo/Heme/Allergies: Negative.   Psychiatric/Behavioral:  The patient is nervous/anxious.        Objective:   BP 117/81   Pulse  70   Temp 97.8 F (36.6 C)   Ht 5' 4 (1.626 m)   Wt 146 lb 12.8 oz (66.6 kg)   SpO2 98%   BMI 25.20 kg/m   Vitals:   05/16/24 1041  BP: 117/81  Pulse: 70  Temp: 97.8 F (36.6 C)  Height: 5' 4 (1.626 m)  Weight: 146 lb 12.8 oz (66.6 kg)  SpO2: 98%  BMI (Calculated): 25.19    Physical Exam Vitals reviewed.  Constitutional:      General: She is not in acute distress. HENT:     Head: Normocephalic.     Nose: Nose normal.     Mouth/Throat:     Mouth: Mucous membranes are moist.  Eyes:     Extraocular Movements: Extraocular movements intact.     Pupils: Pupils are equal, round, and reactive to light.  Cardiovascular:     Rate and Rhythm: Normal rate and regular rhythm.     Heart sounds: No murmur heard. Pulmonary:     Effort: Pulmonary effort is normal.     Breath sounds: No rhonchi or rales.  Abdominal:     General: Abdomen is flat.     Palpations: There is no hepatomegaly, splenomegaly or mass.     Tenderness: There is abdominal tenderness in the left lower quadrant. There is no guarding or rebound.  Musculoskeletal:        General: Normal range of motion.     Cervical back: Normal range of motion. No tenderness.  Skin:    General: Skin is warm and dry.  Neurological:     General: No focal deficit present.     Mental Status: She is alert and oriented to person, place, and time.     Cranial Nerves: No cranial nerve deficit.     Motor: No weakness.  Psychiatric:        Mood and Affect: Mood normal.        Behavior: Behavior normal.      No results found  for any visits on 05/16/24.      Assessment & Plan:  As per problem list and change atbs. RTC if symptoms fail to improve. Problem List Items Addressed This Visit       Nervous and Auditory   Chronic low back pain (Bilateral) (L>R) (Chronic)     Other   Chronic pain syndrome (Chronic)   Other Visit Diagnoses       Acute diverticulitis    -  Primary     Seasonal allergic rhinitis, unspecified trigger           Return in about 1 month (around 06/16/2024).   Total time spent: 20 minutes  Sherrill Cinderella Perry, MD  05/16/2024   This document may have been prepared by [employer] Voice Recognition software and as such may include unintentional dictation errors.

## 2024-05-17 NOTE — ED Provider Notes (Signed)
 [facility] Provider Note    Event Date/Time   First MD Initiated Contact with Patient 05/09/24 1522     (approximate)   History   Back Pain and Pelvic Pain   HPI  Nicole Walter is a 63 y.o. female recently diagnosed with diverticulitis.  Reports she is not taking antibiotic for about a day and a half.  Symptoms have persisted.  She reports it feels like she is not getting any better.  She has been taking treatment.  It is not worsened progressively but is still not improving.  Patient concerned that symptoms are ongoing.  Located primarily in the lower abdomen and pelvic area.  No vomiting.     Physical Exam   Triage Vital Signs: ED Triage Vitals  Encounter Vitals Group     BP 05/09/24 1220 (!) 155/65     Girls Systolic BP Percentile --      Girls Diastolic BP Percentile --      Boys Systolic BP Percentile --      Boys Diastolic BP Percentile --      Pulse Rate 05/09/24 1220 63     Resp 05/09/24 1220 20     Temp 05/09/24 1220 98.3 F (36.8 C)     Temp Source 05/09/24 1220 Oral     SpO2 05/09/24 1220 99 %     Weight 05/09/24 1221 152 lb (68.9 kg)     Height 05/09/24 1221 5' 4 (1.626 m)     Head Circumference --      Peak Flow --      Pain Score 05/09/24 1218 2     Pain Loc --      Pain Education --      Exclude from Growth Chart --     Most recent vital signs: Vitals:   05/09/24 1220  BP: (!) 155/65  Pulse: 63  Resp: 20  Temp: 98.3 F (36.8 C)  SpO2: 99%     General: Awake, no distress.  CV:  Good peripheral perfusion.  Normal tones and rate Resp:  Normal effort.  Clear bilateral Abd:  No distention.  Tenderness to palpation in the lower abdomen, left mid to left lower without rebound or guarding. Other:  Very pleasant.  Warm well-perfused   ED Results / Procedures / Treatments   Labs (all labs ordered are listed, but only abnormal results are displayed) Labs Reviewed   Labs reassuring with normal white count,  competence metabolic panel without acute departures   EKG     RADIOLOGY  CT abdomen pelvis is pending at time of signout to Dr. Jacolyn   PROCEDURES:  Critical Care performed: No  Procedures   MEDICATIONS ORDERED IN ED: Medications  ondansetron  (ZOFRAN ) injection 4 mg (4 mg Intravenous Given 05/09/24 1519)  morphine  (PF) 4 MG/ML injection 4 mg (4 mg Intravenous Given 05/09/24 1518)  iohexol  (OMNIPAQUE ) 300 MG/ML solution 100 mL (100 mLs Intravenous Contrast Given 05/09/24 1547)     IMPRESSION / MDM / ASSESSMENT AND PLAN / ED COURSE  I reviewed the triage vital signs and the nursing notes.                              Differential diagnosis includes but is not limited to, abdominal perforation, aortic dissection, cholecystitis, appendicitis, diverticulitis, colitis, esophagitis/gastritis, kidney stone, pyelonephritis, urinary tract infection, aortic aneurysm. All are considered in decision and treatment plan. Based upon the patient's presentation  and risk factors, given her recent imaging I think it would be reasonable to obtain CT imaging with contrast today to evaluate for any progression or worsening of her diverticulitis.  She reports a persistence of symptoms, but has no features on examination that are highly concerning for acute perforation or complication noted at this time.  She has been on antibiotics briefly   Patient's presentation is most consistent with acute complicated illness / injury requiring diagnostic workup.   Ongoing care assigned to Dr. Jacolyn.  Plan to follow-up on pending imaging results and reassessment of the patient       FINAL CLINICAL IMPRESSION(S) / ED DIAGNOSES   Final diagnoses:  Diverticulitis     Rx / DC Orders   ED Discharge Orders     None        Note:  This document was prepared using Dragon voice recognition software and may include unintentional dictation errors.   Dicky Anes, MD 05/17/24 [id number]

## 2024-06-16 ENCOUNTER — Ambulatory Visit: Admitting: Internal Medicine

## 2024-06-16 VITALS — BP 122/72 | HR 69 | Temp 98.4°F | Ht 64.0 in | Wt 145.0 lb

## 2024-06-16 DIAGNOSIS — M5442 Lumbago with sciatica, left side: Secondary | ICD-10-CM | POA: Diagnosis not present

## 2024-06-16 DIAGNOSIS — F32A Depression, unspecified: Secondary | ICD-10-CM

## 2024-06-16 DIAGNOSIS — G8929 Other chronic pain: Secondary | ICD-10-CM

## 2024-06-16 DIAGNOSIS — Z0130 Encounter for examination of blood pressure without abnormal findings: Secondary | ICD-10-CM

## 2024-06-16 DIAGNOSIS — M5441 Lumbago with sciatica, right side: Secondary | ICD-10-CM | POA: Diagnosis not present

## 2024-06-16 DIAGNOSIS — N939 Abnormal uterine and vaginal bleeding, unspecified: Secondary | ICD-10-CM | POA: Diagnosis not present

## 2024-06-16 DIAGNOSIS — F419 Anxiety disorder, unspecified: Secondary | ICD-10-CM | POA: Diagnosis not present

## 2024-06-16 MED ORDER — OXYCODONE HCL 5 MG PO TABS: 5.0000 mg | ORAL_TABLET | Freq: Two times a day (BID) | ORAL | 0 refills | Status: DC | PRN

## 2024-06-16 MED ORDER — IBUPROFEN 800 MG PO TABS: 800.0000 mg | ORAL_TABLET | Freq: Three times a day (TID) | ORAL | 0 refills | Status: DC | PRN

## 2024-06-16 MED ORDER — LORAZEPAM 1 MG PO TABS
1.0000 mg | ORAL_TABLET | Freq: Three times a day (TID) | ORAL | 1 refills | Status: DC | PRN
Start: 2024-06-16 — End: 2024-08-13

## 2024-06-16 NOTE — Progress Notes (Signed)
 Established Patient Office Visit  Subjective:  Patient ID: Nicole Walter, female    DOB: 03/04/1961  Age: 63 y.o. MRN: [id number]  Chief Complaint  Patient presents with   Pain Management    PM    Here for pain management follow up. Chronic pain well controlled on current analgesia. Last UDS satisfactory and pill counts have also been satisfactory. Also c/o occasional vaginal bleeding.    No other concerns at this time.   Past Medical History:  Diagnosis Date   Abnormal liver enzymes 02/08/2016   Allergy    Anxiety    Chest pain 07/05/2016   Chronic pain    Depression    DJD (degenerative joint disease)    lumbar   Fibromyalgia    High cholesterol    Rash of back 01/28/2016   Weight loss due to medication     Past Surgical History:  Procedure Laterality Date   ANKLE SURGERY     x 2   BREAST BIOPSY Left 1997   CHOLECYSTECTOMY     COLONOSCOPY     COLONOSCOPY WITH PROPOFOL  N/A 02/22/2016   Procedure: COLONOSCOPY WITH PROPOFOL ;  Surgeon: Gladis RAYMOND Mariner, MD;  Location: [facility] ENDOSCOPY;  Service: Endoscopy;  Laterality: N/A;   ESOPHAGOGASTRODUODENOSCOPY (EGD) WITH PROPOFOL  N/A 02/22/2016   Procedure: ESOPHAGOGASTRODUODENOSCOPY (EGD) WITH PROPOFOL ;  Surgeon: Gladis RAYMOND Mariner, MD;  Location: [facility] ENDOSCOPY;  Service: Endoscopy;  Laterality: N/A;   NASAL SINUS SURGERY     SHOULDER ARTHROSCOPY WITH ROTATOR CUFF REPAIR AND SUBACROMIAL DECOMPRESSION Left 09/23/2019   Procedure: SHOULDER ARTHROSCOPY WITH MINI ROTATOR CUFF REPAIR AND SUBACROMIAL DECOMPRESSION, BICEPS TENODESIS;  Surgeon: Tobie Priest, MD;  Location: [facility];  Service: Orthopedics;  Laterality: Left;   TONSILLECTOMY     TUBAL LIGATION      Social History   Socioeconomic History   Marital status: Legally Separated    Spouse name: Not on file   Number of children: Not on file   Years of education: Not on file   Highest education level: Not on file  Occupational History   Not on file  Tobacco  Use   Smoking status: Former    Current packs/day: 0.00    Average packs/day: 1 pack/day for 41.3 years (41.3 ttl pk-yrs)    Types: E-cigarettes, Cigarettes    Start date: 03/06/1974    Quit date: 07/08/2015    Years since quitting: 8.9   Smokeless tobacco: Never   Tobacco comments:    vapor cigarettes, no nicotene  Vaping Use   Vaping status: Every Day   Start date: 07/08/2015   Substances: Nicotine , Flavoring   Devices: Nort, Geek  Substance and Sexual Activity   Alcohol use: No    Alcohol/week: 0.0 standard drinks of alcohol   Drug use: No   Sexual activity: Not Currently    Birth control/protection: Post-menopausal  Other Topics Concern   Not on file  Social History Narrative   Not on file   Social Drivers of Health   Financial Resource Strain: Medium Risk (06/03/2024)   Received from [facility]   Overall Financial Resource Strain (CARDIA)    Difficulty of Paying Living Expenses: Somewhat hard  Food Insecurity: Food Insecurity Present (06/03/2024)   Received from [facility]   Hunger Vital Sign    Within the past 12 months, you worried that your food would run out before you got the money to buy more.: Often true    Within the past 12  months, the food you bought just didn't last and you didn't have money to get more.: Often true  Transportation Needs: No Transportation Needs (06/03/2024)   Received from [facility] - Transportation    In the past 12 months, has lack of transportation kept you from medical appointments or from getting medications?: No    Lack of Transportation (Non-Medical): No  Physical Activity: Not on file  Stress: Not on file  Social Connections: Not on file  Intimate Partner Violence: Not on file    Family History  Problem Relation Age of Onset   Heart disease Mother    Stroke Mother    Cancer Father        lung   Arthritis Father    Arthritis Paternal Grandmother    Arthritis  Paternal Grandfather    Drug abuse Sister    Drug abuse Brother    Post-traumatic stress disorder Brother    Diabetes Sister    Dementia Sister    Drug abuse Brother    Cancer Brother     Allergies  Allergen Reactions   Other Hives and Other (See Comments)    Pt states she was tested for allergies and peas was one that she is allergic to but has never ate enough of them to see a reaction. Pt states when she is around bird feathers she gets the hives FEATHERS   Adhesive [Tape] Hives   Food     PEAS/ patient does not know what type reaction she has/ from allergy test during childhood   Statins Other (See Comments)    Severe myalgia   Pea Other (See Comments)    Other reaction(Shanetha Bradham): Unknown.  Pt states she was tested for allergies and peas was one that she is allergic to but has never ate enough of them to see a reaction.    Outpatient Medications Prior to Visit  Medication Sig   Azelastine  HCl 137 MCG/SPRAY SOLN Place 1 Inhalation into the nose daily.   Calcium Carb-Cholecalciferol (CALCIUM 600 + D) 600-5 MG-MCG TABS Take by mouth.   cetirizine  (ZYRTEC ) 10 MG tablet Take 1 tablet (10 mg total) by mouth every morning.   LINZESS  72 MCG capsule TK 1 C PO QD   Multiple Vitamin (MULTI-VITAMINS) TABS Take 1 tablet by mouth daily.   ondansetron  (ZOFRAN -ODT) 4 MG disintegrating tablet Take 1 tablet (4 mg total) by mouth every 8 (eight) hours as needed for nausea or vomiting.   vortioxetine  HBr (TRINTELLIX ) 5 MG TABS tablet TAKE 1 TABLET BY MOUTH THREE TIMES DAILY BEFORE MEAL(Bryndan Bilyk) (Patient taking differently: Take 5 mg by mouth as needed.)   [DISCONTINUED] ibuprofen  (ADVIL ) 800 MG tablet Take 1 tablet (800 mg total) by mouth every 8 (eight) hours as needed.   [DISCONTINUED] LORazepam  (ATIVAN ) 1 MG tablet Take 1 tablet (1 mg total) by mouth every 8 (eight) hours as needed. for anxiety   nicotine  (NICODERM CQ  - DOSED IN MG/24 HOURS) 14 mg/24hr patch Place 1 patch (14 mg total) onto the skin daily.  (Patient not taking: Reported on 06/16/2024)   [DISCONTINUED] Semaglutide -Weight Management (WEGOVY ) 1.7 MG/0.75ML SOAJ Inject 1.7 mg into the skin once a week. (Patient not taking: Reported on 06/16/2024)   No facility-administered medications prior to visit.    Review of Systems  Constitutional:  Negative for weight loss.  HENT: Negative.    Eyes: Negative.   Cardiovascular: Negative.   Gastrointestinal: Negative.   Genitourinary: Negative.  Urge incontinence  Musculoskeletal:  Positive for back pain, joint pain and neck pain.       As in hpi  Skin: Negative.   Neurological: Negative.   Endo/Heme/Allergies: Negative.   Psychiatric/Behavioral:  The patient is nervous/anxious.        Objective:   BP 122/72   Pulse 69   Temp 98.4 F (36.9 C)   Ht 5' 4 (1.626 m)   Wt 145 lb (65.8 kg)   SpO2 98%   BMI 24.89 kg/m   Vitals:   06/16/24 1058  BP: 122/72  Pulse: 69  Temp: 98.4 F (36.9 C)  Height: 5' 4 (1.626 m)  Weight: 145 lb (65.8 kg)  SpO2: 98%  BMI (Calculated): 24.88    Physical Exam Vitals reviewed.  Constitutional:      General: She is not in acute distress. HENT:     Head: Normocephalic.     Nose: Nose normal.     Mouth/Throat:     Mouth: Mucous membranes are moist.  Eyes:     Extraocular Movements: Extraocular movements intact.     Pupils: Pupils are equal, round, and reactive to light.  Cardiovascular:     Rate and Rhythm: Normal rate and regular rhythm.     Heart sounds: No murmur heard. Pulmonary:     Effort: Pulmonary effort is normal.     Breath sounds: No rhonchi or rales.  Abdominal:     General: Abdomen is flat.     Palpations: There is no hepatomegaly, splenomegaly or mass.     Tenderness: There is abdominal tenderness in the left lower quadrant. There is no guarding or rebound.  Musculoskeletal:        General: Normal range of motion.     Cervical back: Normal range of motion. No tenderness.  Skin:    General: Skin is warm  and dry.  Neurological:     General: No focal deficit present.     Mental Status: She is alert and oriented to person, place, and time.     Cranial Nerves: No cranial nerve deficit.     Motor: No weakness.  Psychiatric:        Mood and Affect: Mood normal.        Behavior: Behavior normal.      No results found for any visits on 06/16/24.      Assessment & Plan:  Kenisha was seen today for pain management.  Chronic low back pain (Bilateral) (L>R) -     oxyCODONE  HCl; Take 1 tablet (5 mg total) by mouth 2 (two) times daily as needed for severe pain (pain score 7-10). Prescribed by [name], Shanesha Bednarz Ahmed, MD  Dispense: 60 tablet; Refill: 0 -     Ibuprofen ; Take 1 tablet (800 mg total) by mouth every 8 (eight) hours as needed.  Dispense: 90 tablet; Refill: 0  Vaginal bleeding -     Ambulatory referral to Gynecology  Anxiety and depression -     LORazepam ; Take 1 tablet (1 mg total) by mouth every 8 (eight) hours as needed. for anxiety  Dispense: 30 tablet; Refill: 1    Problem List Items Addressed This Visit       Nervous and Auditory   Chronic low back pain (Bilateral) (L>R) - Primary (Chronic)   Relevant Medications   oxyCODONE  (OXY IR/ROXICODONE ) 5 MG immediate release tablet   LORazepam  (ATIVAN ) 1 MG tablet   ibuprofen  (ADVIL ) 800 MG tablet     Other   Anxiety  and depression   Relevant Medications   LORazepam  (ATIVAN ) 1 MG tablet   Other Visit Diagnoses       Vaginal bleeding       Relevant Orders   Ambulatory referral to Gynecology       Return in about 1 month (around 07/17/2024) for Pain Management.   Total time spent: 30 minutes  Sherrill Cinderella Perry, MD  06/16/2024   This document may have been prepared by [facility] Voice Recognition software and as such may include unintentional dictation errors.

## 2024-06-17 ENCOUNTER — Encounter: Payer: Self-pay | Admitting: Internal Medicine

## 2024-06-17 DIAGNOSIS — E782 Mixed hyperlipidemia: Secondary | ICD-10-CM

## 2024-06-17 LAB — COMPREHENSIVE METABOLIC PANEL WITH GFR
ALT: 19 IU/L (ref 0–32)
AST: 23 IU/L (ref 0–40)
Albumin: 4.5 g/dL (ref 3.9–4.9)
Alkaline Phosphatase: 79 IU/L (ref 44–121)
BUN/Creatinine Ratio: 16 (ref 12–28)
BUN: 13 mg/dL (ref 8–27)
Bilirubin Total: 0.3 mg/dL (ref 0.0–1.2)
CO2: 22 mmol/L (ref 20–29)
Calcium: 9.5 mg/dL (ref 8.7–10.3)
Chloride: 106 mmol/L (ref 96–106)
Creatinine, Ser: 0.82 mg/dL (ref 0.57–1.00)
Globulin, Total: 2.1 g/dL (ref 1.5–4.5)
Glucose: 96 mg/dL (ref 70–99)
Potassium: 4.5 mmol/L (ref 3.5–5.2)
Sodium: 144 mmol/L (ref 134–144)
Total Protein: 6.6 g/dL (ref 6.0–8.5)
eGFR: 81 mL/min/1.73 (ref >59)

## 2024-06-17 LAB — LIPID PANEL
Chol/HDL Ratio: 4 ratio (ref 0.0–4.4)
Cholesterol, Total: 226 mg/dL — ABNORMAL HIGH (ref 100–199)
HDL: 56 mg/dL (ref >39)
LDL Chol Calc (NIH): 148 mg/dL — ABNORMAL HIGH (ref 0–99)
Triglycerides: 121 mg/dL (ref 0–149)
VLDL Cholesterol Cal: 22 mg/dL (ref 5–40)

## 2024-06-17 NOTE — Addendum Note (Signed)
 Addended by: BRYCE MARKER AHMAD on: 06/17/2024 12:45 PM   Modules accepted: Orders

## 2024-06-19 ENCOUNTER — Other Ambulatory Visit

## 2024-06-19 ENCOUNTER — Telehealth: Payer: Self-pay

## 2024-06-19 NOTE — Telephone Encounter (Signed)
 Patient was in office for labs today and states that you told her to make her PM appts every 2 months, I told her that I needed to confirm with you first since her appts for the rest of the year have already been made

## 2024-06-20 NOTE — Telephone Encounter (Signed)
 PM appts updated

## 2024-06-23 ENCOUNTER — Ambulatory Visit
Admission: RE | Admit: 2024-06-23 | Discharge: 2024-06-23 | Disposition: A | Discharge status: Home / Self Care | Source: Ambulatory Visit | Attending: Internal Medicine | Admitting: Internal Medicine

## 2024-06-23 ENCOUNTER — Ambulatory Visit: Admitting: Internal Medicine

## 2024-06-23 ENCOUNTER — Other Ambulatory Visit

## 2024-06-23 DIAGNOSIS — N939 Abnormal uterine and vaginal bleeding, unspecified: Secondary | ICD-10-CM | POA: Diagnosis present

## 2024-06-24 LAB — LIPOPROTEIN ANALYSIS BY NMR
HDL Particle Number: 35.1 umol/L (ref >=30.5)
LDL Particle Number: 1651 nmol/L — ABNORMAL HIGH (ref <1000)
LDL Size: 21.4 nm (ref >20.5)
LP-IR Score: <25 (ref <=45)
Small LDL Particle Number: 552 nmol/L — ABNORMAL HIGH (ref <=527)

## 2024-06-30 ENCOUNTER — Ambulatory Visit: Payer: Self-pay | Admitting: Internal Medicine

## 2024-06-30 ENCOUNTER — Ambulatory Visit: Admitting: Internal Medicine

## 2024-06-30 ENCOUNTER — Encounter: Payer: Self-pay | Admitting: Internal Medicine

## 2024-06-30 VITALS — BP 132/70 | HR 84 | Temp 97.9°F | Ht 64.0 in | Wt 144.6 lb

## 2024-06-30 DIAGNOSIS — E782 Mixed hyperlipidemia: Secondary | ICD-10-CM | POA: Diagnosis not present

## 2024-06-30 DIAGNOSIS — K760 Fatty (change of) liver, not elsewhere classified: Secondary | ICD-10-CM | POA: Diagnosis not present

## 2024-06-30 DIAGNOSIS — E663 Overweight: Secondary | ICD-10-CM

## 2024-06-30 DIAGNOSIS — F419 Anxiety disorder, unspecified: Secondary | ICD-10-CM | POA: Diagnosis not present

## 2024-06-30 DIAGNOSIS — F32A Depression, unspecified: Secondary | ICD-10-CM

## 2024-06-30 DIAGNOSIS — Z0130 Encounter for examination of blood pressure without abnormal findings: Secondary | ICD-10-CM

## 2024-06-30 MED ORDER — FENOFIBRATE 160 MG PO TABS: 160.0000 mg | ORAL_TABLET | Freq: Every day | ORAL | 0 refills | Status: DC

## 2024-06-30 MED ORDER — EZETIMIBE 10 MG PO TABS: 10.0000 mg | ORAL_TABLET | Freq: Every day | ORAL | 11 refills | Status: AC

## 2024-06-30 NOTE — Progress Notes (Signed)
 Established Patient Office Visit  Subjective:  Patient ID: Nicole Walter, female    DOB: [birth year]  Age: 63 y.o. MRN: [id number]  Chief Complaint  Patient presents with   Follow-up    2 month follow up    No new complaints, here for lab review and medication refills. Cholesterol elevated on lab review with elevated LDL-p associated with higher CV risk.    No other concerns at this time.   Past Medical History:  Diagnosis Date   Abnormal liver enzymes 02/08/2016   Allergy    Anxiety    Chest pain 07/05/2016   Chronic pain    Depression    DJD (degenerative joint disease)    lumbar   Fibromyalgia    High cholesterol    Rash of back 01/28/2016   Weight loss due to medication     Past Surgical History:  Procedure Laterality Date   ANKLE SURGERY     x 2   BREAST BIOPSY Left 1997   CHOLECYSTECTOMY     COLONOSCOPY     COLONOSCOPY WITH PROPOFOL  N/A 02/22/2016   Procedure: COLONOSCOPY WITH PROPOFOL ;  Surgeon: Gladis RAYMOND Mariner, MD;  Location: [facility] ENDOSCOPY;  Service: Endoscopy;  Laterality: N/A;   ESOPHAGOGASTRODUODENOSCOPY (EGD) WITH PROPOFOL  N/A 02/22/2016   Procedure: ESOPHAGOGASTRODUODENOSCOPY (EGD) WITH PROPOFOL ;  Surgeon: Gladis RAYMOND Mariner, MD;  Location: [facility] ENDOSCOPY;  Service: Endoscopy;  Laterality: N/A;   NASAL SINUS SURGERY     SHOULDER ARTHROSCOPY WITH ROTATOR CUFF REPAIR AND SUBACROMIAL DECOMPRESSION Left 09/23/2019   Procedure: SHOULDER ARTHROSCOPY WITH MINI ROTATOR CUFF REPAIR AND SUBACROMIAL DECOMPRESSION, BICEPS TENODESIS;  Surgeon: Tobie Priest, MD;  Location: [facility];  Service: Orthopedics;  Laterality: Left;   TONSILLECTOMY     TUBAL LIGATION      Social History   Socioeconomic History   Marital status: Legally Separated    Spouse name: Not on file   Number of children: Not on file   Years of education: Not on file   Highest education level: Not on file  Occupational History   Not on file  Tobacco Use   Smoking status: Former     Current packs/day: 0.00    Average packs/day: 1 pack/day for 41.3 years (41.3 ttl pk-yrs)    Types: E-cigarettes, Cigarettes    Start date: 03/06/1974    Quit date: 07/08/2015    Years since quitting: 8.9   Smokeless tobacco: Never   Tobacco comments:    vapor cigarettes, no nicotene  Vaping Use   Vaping status: Every Day   Start date: 07/08/2015   Substances: Nicotine , Flavoring   Devices: [employer]  Substance and Sexual Activity   Alcohol use: No    Alcohol/week: 0.0 standard drinks of alcohol   Drug use: No   Sexual activity: Not Currently    Birth control/protection: Post-menopausal  Other Topics Concern   Not on file  Social History Narrative   Not on file   Social Drivers of Health   Financial Resource Strain: Medium Risk (06/03/2024)   Received from [facility]   Overall Financial Resource Strain (CARDIA)    Difficulty of Paying Living Expenses: Somewhat hard  Food Insecurity: Food Insecurity Present (06/03/2024)   Received from [facility]   Hunger Vital Sign    Within the past 12 months, you worried that your food would run out before you got the money to buy more.: Often true    Within the past 12 months, the food  you bought just didn't last and you didn't have money to get more.: Often true  Transportation Needs: No Transportation Needs (06/03/2024)   Received from [facility] - Transportation    In the past 12 months, has lack of transportation kept you from medical appointments or from getting medications?: No    Lack of Transportation (Non-Medical): No  Physical Activity: Not on file  Stress: Not on file  Social Connections: Not on file  Intimate Partner Violence: Not on file    Family History  Problem Relation Age of Onset   Heart disease Mother    Stroke Mother    Cancer Father        lung   Arthritis Father    Arthritis Paternal Grandmother    Arthritis Paternal Grandfather    Drug abuse  Sister    Drug abuse Brother    Post-traumatic stress disorder Brother    Diabetes Sister    Dementia Sister    Drug abuse Brother    Cancer Brother     Allergies  Allergen Reactions   Other Hives and Other (See Comments)    Pt states she was tested for allergies and peas was one that she is allergic to but has never ate enough of them to see a reaction. Pt states when she is around bird feathers she gets the hives FEATHERS   Adhesive [Tape] Hives   Food     PEAS/ patient does not know what type reaction she has/ from allergy test during childhood   Statins Other (See Comments)    Severe myalgia   Pea Other (See Comments)    Other reaction(Chaya Dehaan): Unknown.  Pt states she was tested for allergies and peas was one that she is allergic to but has never ate enough of them to see a reaction.    Outpatient Medications Prior to Visit  Medication Sig   omeprazole (PRILOSEC) 40 MG capsule Take 40 mg by mouth daily.   Vilazodone HCl (VIIBRYD) 10 MG TABS Take 10 mg by mouth daily.   Azelastine  HCl 137 MCG/SPRAY SOLN Place 1 Inhalation into the nose daily.   Calcium Carb-Cholecalciferol (CALCIUM 600 + D) 600-5 MG-MCG TABS Take by mouth.   cetirizine  (ZYRTEC ) 10 MG tablet Take 1 tablet (10 mg total) by mouth every morning.   ibuprofen  (ADVIL ) 800 MG tablet Take 1 tablet (800 mg total) by mouth every 8 (eight) hours as needed.   LINZESS  72 MCG capsule TK 1 C PO QD   LORazepam  (ATIVAN ) 1 MG tablet Take 1 tablet (1 mg total) by mouth every 8 (eight) hours as needed. for anxiety   Multiple Vitamin (MULTI-VITAMINS) TABS Take 1 tablet by mouth daily.   nicotine  (NICODERM CQ  - DOSED IN MG/24 HOURS) 14 mg/24hr patch Place 1 patch (14 mg total) onto the skin daily. (Patient not taking: Reported on 06/16/2024)   ondansetron  (ZOFRAN -ODT) 4 MG disintegrating tablet Take 1 tablet (4 mg total) by mouth every 8 (eight) hours as needed for nausea or vomiting.   oxyCODONE  (OXY IR/ROXICODONE ) 5 MG immediate release  tablet Take 1 tablet (5 mg total) by mouth 2 (two) times daily as needed for severe pain (pain score 7-10). Prescribed by Albina GORMAN Dine, MD   [DISCONTINUED] vortioxetine  HBr (TRINTELLIX ) 5 MG TABS tablet TAKE 1 TABLET BY MOUTH THREE TIMES DAILY BEFORE MEAL(Maisey Deandrade) (Patient taking differently: Take 5 mg by mouth as needed.)   No facility-administered medications prior to visit.  Review of Systems  Constitutional:  Negative for weight loss.  HENT: Negative.    Eyes: Negative.   Cardiovascular: Negative.   Gastrointestinal: Negative.   Genitourinary: Negative.        Urge incontinence  Musculoskeletal:  Positive for back pain, joint pain and neck pain.       As in hpi  Skin: Negative.   Neurological: Negative.   Endo/Heme/Allergies: Negative.   Psychiatric/Behavioral:  The patient is nervous/anxious.        Objective:   BP 132/70   Pulse 84   Ht 5' 4 (1.626 m)   Wt 144 lb 9.6 oz (65.6 kg)   SpO2 97%   BMI 24.82 kg/m   Vitals:   06/30/24 1018  BP: 132/70  Pulse: 84  Height: 5' 4 (1.626 m)  Weight: 144 lb 9.6 oz (65.6 kg)  SpO2: 97%  BMI (Calculated): 24.81    Physical Exam Vitals reviewed.  Constitutional:      General: She is not in acute distress. HENT:     Head: Normocephalic.     Nose: Nose normal.     Mouth/Throat:     Mouth: Mucous membranes are moist.  Eyes:     Extraocular Movements: Extraocular movements intact.     Pupils: Pupils are equal, round, and reactive to light.  Cardiovascular:     Rate and Rhythm: Normal rate and regular rhythm.     Heart sounds: No murmur heard. Pulmonary:     Effort: Pulmonary effort is normal.     Breath sounds: No rhonchi or rales.  Abdominal:     General: Abdomen is flat.     Palpations: There is no hepatomegaly, splenomegaly or mass.     Tenderness: There is abdominal tenderness in the left lower quadrant. There is no guarding or rebound.  Musculoskeletal:        General: Normal range of motion.      Cervical back: Normal range of motion. No tenderness.  Skin:    General: Skin is warm and dry.  Neurological:     General: No focal deficit present.     Mental Status: She is alert and oriented to person, place, and time.     Cranial Nerves: No cranial nerve deficit.     Motor: No weakness.  Psychiatric:        Mood and Affect: Mood normal.        Behavior: Behavior normal.      No results found for any visits on 06/30/24.  Recent Results (from the past 2160 hours)  Urine Culture     Status: Abnormal   Collection Time: 05/07/24  4:30 PM   Specimen: Urine, Clean Catch  Result Value Ref Range   Specimen Description      URINE, CLEAN CATCH Performed at [facility], [address], [place], [place] [address]    Special Requests      NONE Performed at [facility], [address] [place], [place] [address]    Culture MULTIPLE SPECIES PRESENT, SUGGEST RECOLLECTION (A)    Report Status 05/08/2024 FINAL   Lipase, blood     Status: None   Collection Time: 05/07/24  4:35 PM  Result Value Ref Range   Lipase 32 11 - 51 U/L    Comment: Performed at [facility], [address] [place], [place] [address]  Comprehensive metabolic panel     Status: None   Collection Time: 05/07/24  4:35 PM  Result Value Ref  Range   Sodium 139 135 - 145 mmol/L   Potassium 3.9 3.5 - 5.1 mmol/L   Chloride 106 98 - 111 mmol/L   CO2 24 22 - 32 mmol/L   Glucose, Bld 94 70 - 99 mg/dL    Comment: Glucose reference range applies only to samples taken after fasting for at least 8 hours.   BUN 16 8 - 23 mg/dL   Creatinine, Ser 9.22 0.44 - 1.00 mg/dL   Calcium 9.1 8.9 - 89.6 mg/dL   Total Protein 6.5 6.5 - 8.1 g/dL   Albumin 3.8 3.5 - 5.0 g/dL   AST 19 15 - 41 U/L   ALT 17 0 - 44 U/L   Alkaline Phosphatase 57 38 - 126 U/L   Total Bilirubin 0.6 0.0 - 1.2 mg/dL   GFR, Estimated >39 >39 mL/min    Comment: (NOTE) Calculated using the CKD-EPI Creatinine Equation (2021)     Anion gap 9 5 - 15    Comment: Performed at [facility], [address] [place], [place] [address]  CBC     Status: Abnormal   Collection Time: 05/07/24  4:35 PM  Result Value Ref Range   WBC 8.4 4.0 - 10.5 K/uL   RBC 3.90 3.87 - 5.11 MIL/uL   Hemoglobin 11.7 (L) 12.0 - 15.0 g/dL   HCT 65.1 (L) 63.9 - 53.9 %   MCV 89.2 80.0 - 100.0 fL   MCH 30.0 26.0 - 34.0 pg   MCHC 33.6 30.0 - 36.0 g/dL   RDW 86.7 88.4 - 84.4 %   Platelets 270 150 - 400 K/uL   nRBC 0.0 0.0 - 0.2 %    Comment: Performed at [facility], [address] [place], [place] [address]  Urinalysis, Routine w reflex microscopic -Urine, Clean Catch     Status: Abnormal   Collection Time: 05/07/24  4:35 PM  Result Value Ref Range   Color, Urine YELLOW (A) YELLOW   APPearance CLEAR (A) CLEAR   Specific Gravity, Urine 1.014 1.005 - 1.030   pH 5.0 5.0 - 8.0   Glucose, UA NEGATIVE NEGATIVE mg/dL   Hgb urine dipstick NEGATIVE NEGATIVE   Bilirubin Urine NEGATIVE NEGATIVE   Ketones, ur NEGATIVE NEGATIVE mg/dL   Protein, ur NEGATIVE NEGATIVE mg/dL   Nitrite NEGATIVE NEGATIVE   Leukocytes,Ua LARGE (A) NEGATIVE   RBC / HPF 0-5 0 - 5 RBC/hpf   WBC, UA 6-10 0 - 5 WBC/hpf   Bacteria, UA RARE (A) NONE SEEN   Squamous Epithelial / HPF 0-5 0 - 5 /HPF   Mucus PRESENT     Comment: Performed at [facility], [address] [place], [place] [address]  Wet prep, genital     Status: Abnormal   Collection Time: 05/07/24  5:29 PM  Result Value Ref Range   Yeast Wet Prep HPF POC NONE SEEN NONE SEEN   Trich, Wet Prep NONE SEEN NONE SEEN   Clue Cells Wet Prep HPF POC NONE SEEN NONE SEEN   WBC, Wet Prep HPF POC >=10 (A) <10   Sperm NONE SEEN     Comment: Performed at [facility], [address] [place], [place] [address]  Urinalysis, Routine w reflex microscopic -Urine, Clean Catch     Status: Abnormal   Collection Time: 05/09/24 12:22 PM  Result Value Ref Range   Color, Urine YELLOW (A) YELLOW    APPearance CLEAR (A) CLEAR   Specific Gravity, Urine 1.015 1.005 - 1.030  pH 7.0 5.0 - 8.0   Glucose, UA NEGATIVE NEGATIVE mg/dL   Hgb urine dipstick NEGATIVE NEGATIVE   Bilirubin Urine NEGATIVE NEGATIVE   Ketones, ur NEGATIVE NEGATIVE mg/dL   Protein, ur NEGATIVE NEGATIVE mg/dL   Nitrite NEGATIVE NEGATIVE   Leukocytes,Ua MODERATE (A) NEGATIVE   RBC / HPF 0-5 0 - 5 RBC/hpf   WBC, UA 6-10 0 - 5 WBC/hpf   Bacteria, UA RARE (A) NONE SEEN   Squamous Epithelial / HPF 0-5 0 - 5 /HPF   Mucus PRESENT     Comment: Performed at [facility], [address], [place], [place] [address]  Basic metabolic panel     Status: Abnormal   Collection Time: 05/09/24 12:22 PM  Result Value Ref Range   Sodium 142 135 - 145 mmol/L   Potassium 3.9 3.5 - 5.1 mmol/L   Chloride 107 98 - 111 mmol/L   CO2 25 22 - 32 mmol/L   Glucose, Bld 104 (H) 70 - 99 mg/dL    Comment: Glucose reference range applies only to samples taken after fasting for at least 8 hours.   BUN 16 8 - 23 mg/dL   Creatinine, Ser 9.22 0.44 - 1.00 mg/dL   Calcium 9.0 8.9 - 89.6 mg/dL   GFR, Estimated >39 >39 mL/min    Comment: (NOTE) Calculated using the CKD-EPI Creatinine Equation (2021)    Anion gap 10 5 - 15    Comment: Performed at [facility], [address] [place], [place] [address]  CBC     Status: None   Collection Time: 05/09/24 12:22 PM  Result Value Ref Range   WBC 5.7 4.0 - 10.5 K/uL   RBC 4.23 3.87 - 5.11 MIL/uL   Hemoglobin 12.5 12.0 - 15.0 g/dL   HCT 61.7 63.9 - 53.9 %   MCV 90.3 80.0 - 100.0 fL   MCH 29.6 26.0 - 34.0 pg   MCHC 32.7 30.0 - 36.0 g/dL   RDW 86.9 88.4 - 84.4 %   Platelets 268 150 - 400 K/uL   nRBC 0.0 0.0 - 0.2 %    Comment: Performed at [facility], [address] [place], [place] [address]  Lipase, blood     Status: None   Collection Time: 05/09/24 12:22 PM  Result Value Ref Range   Lipase 36 11 - 51 U/L    Comment: Performed at [facility], [address] [place], [place] [address]  Hepatic function panel     Status: None   Collection Time: 05/09/24 12:22 PM  Result Value Ref Range   Total Protein 6.7 6.5 - 8.1 g/dL   Albumin 3.7 3.5 - 5.0 g/dL   AST 20 15 - 41 U/L   ALT 15 0 - 44 U/L   Alkaline Phosphatase 61 38 - 126 U/L   Total Bilirubin 0.5 0.0 - 1.2 mg/dL   Bilirubin, Direct <9.8 0.0 - 0.2 mg/dL   Indirect Bilirubin NOT CALCULATED 0.3 - 0.9 mg/dL    Comment: Performed at [facility], [address] [place], [place] [address]  Urine Culture     Status: Abnormal   Collection Time: 05/09/24 12:22 PM   Specimen: Urine, Clean Catch  Result Value Ref Range   Specimen Description      URINE, CLEAN CATCH Performed at [facility], [address] [place], [place] [address]    Special Requests      NONE Performed at [facility], [address] [place], [place] [address]    Culture 20,000 COLONIES/mL PSEUDOMONAS AERUGINOSA (A)    Report Status 05/12/2024 FINAL    Organism ID, Bacteria PSEUDOMONAS AERUGINOSA (A)       Susceptibility   Pseudomonas aeruginosa - MIC*    CEFTAZIDIME <=1 SENSITIVE Sensitive     CIPROFLOXACIN  <=0.25 SENSITIVE Sensitive     GENTAMICIN <=1 SENSITIVE Sensitive     IMIPENEM <=0.25 SENSITIVE Sensitive     PIP/TAZO <=4 SENSITIVE Sensitive ug/mL    CEFEPIME 0.25 SENSITIVE Sensitive     * 20,000 COLONIES/mL PSEUDOMONAS AERUGINOSA  Comprehensive metabolic panel with GFR     Status: None   Collection Time: 06/16/24 10:05 AM  Result Value Ref Range   Glucose 96 70 - 99 mg/dL   BUN 13 8 - 27 mg/dL   Creatinine, Ser 9.17 0.57 - 1.00 mg/dL   eGFR 81 >40 fO/fpw/8.26   BUN/Creatinine Ratio 16 12 - 28   Sodium 144 134 - 144 mmol/L   Potassium 4.5 3.5 - 5.2 mmol/L   Chloride 106 96 - 106 mmol/L   CO2 22 20 - 29 mmol/L   Calcium 9.5 8.7 - 10.3 mg/dL   Total Protein 6.6 6.0 - 8.5 g/dL   Albumin 4.5 3.9 - 4.9 g/dL   Globulin, Total 2.1 1.5 - 4.5 g/dL   Bilirubin Total  0.3 0.0 - 1.2 mg/dL   Alkaline Phosphatase 79 44 - 121 IU/L   AST 23 0 - 40 IU/L   ALT 19 0 - 32 IU/L  Lipid panel     Status: Abnormal   Collection Time: 06/16/24 10:06 AM  Result Value Ref Range   Cholesterol, Total 226 (H) 100 - 199 mg/dL   Triglycerides 878 0 - 149 mg/dL   HDL 56 >60 mg/dL   VLDL Cholesterol Cal 22 5 - 40 mg/dL   LDL Chol Calc (NIH) 851 (H) 0 - 99 mg/dL   Chol/HDL Ratio 4.0 0.0 - 4.4 ratio    Comment:                                   T. Chol/HDL Ratio                                             Men  Women                               1/2 Avg.Risk  3.4    3.3                                   Avg.Risk  5.0    4.4                                2X Avg.Risk  9.6    7.1                                3X Avg.Risk 23.4   11.0   Lipoprotein Analysis by NMR     Status: Abnormal   Collection Time: 06/23/24  9:02 AM  Result Value Ref Range   LDL Particle Number [id number] (H) <1,000 nmol/L    Comment:                           Low                   < 1000                           Moderate         1000 - 1299                           Borderline-High  1300 - 1599                           High             1600 - 2000                           Very High             > 2000    HDL Particle Number [id number] >=30.5 umol/L   Small LDL Particle Number 552 (H) <=527 nmol/L   LDL Size 21.4 >20.5 nm    Comment:  ----------------------------------------------------------                  ** INTERPRETATIVE INFORMATION**                  PARTICLE CONCENTRATION AND SIZE                     <--Lower CVD Risk   Higher CVD Risk-->   LDL AND HDL PARTICLES   Percentile in Reference Population   HDL-P (total)        High     75th    50th    25th   Low                        >34.9    34.9    30.5    26.7   <26.7   Small LDL-P          Low      25th    50th    75th   High                        <117     117     527     839    >839   LDL Size   <-Large (Pattern A)->    <-Small (Pattern B)->                      23.0    20.6           20.5      19.0  ---------------------------------------------------------- Small LDL-P and LDL Size are associated with CVD risk, but not after LDL-P is taken into account.    LP-IR Score <25 <=45    Comment: INSULIN  RESISTANCE MARKER     Insulin  Sensitive    Insulin  Resistant-->            Percentile in Reference Population Insulin  Resistance Score LP-IR Score   Low  25th   50th   75th   High               <27   27     45     63     >63 LP-IR Score is inaccurate if patient is non-fasting. The LP-IR score is a laboratory developed index that has been associated with insulin  resistance and diabetes risk and should be used as one component of a physician'Ellakate Gonsalves clinical assessment.       Assessment & Plan:  Yobana was seen today for follow-up.  Mixed hyperlipidemia -     Fenofibrate ; Take 1 tablet (160 mg total) by mouth daily.  Dispense: 90 tablet; Refill: 0 -     Ezetimibe ; Take 1 tablet (10 mg total) by mouth daily.  Dispense: 30 tablet; Refill: 11  Anxiety and depression  Overweight (BMI 25.0-29.9)  Fatty liver   Stricter low calorie diet, low cholesterol and low fat diet and exercise as much as possible  Problem List Items Addressed This Visit       Digestive   Fatty liver     Other   Anxiety and depression   Relevant Medications   Vilazodone HCl (VIIBRYD) 10 MG TABS   Hyperlipidemia - Primary   Relevant Medications   fenofibrate  160 MG tablet   ezetimibe  (ZETIA ) 10 MG tablet   Overweight (BMI 25.0-29.9)    Return in about 3 months (around 09/30/2024) for fu with labs prior.   Total time spent: 30 minutes  Sherrill Cinderella Perry, MD  06/30/2024   This document may have been prepared by [facility] Voice Recognition software and as such may include unintentional dictation errors.

## 2024-07-03 NOTE — Therapy (Signed)
 OUTPATIENT PHYSICAL THERAPY CERVICAL and HIP EVALUATION   Patient Name: Nicole Walter MRN: [id number] DOB:1961/09/03, 63 y.o., female Today's Date: 07/04/2024  END OF SESSION:  PT End of Session - 07/04/24 1028     Visit Number 1    Number of Visits 24    Date for PT Re-Evaluation 09/26/24    PT Start Time 1018    PT Stop Time 1103    PT Time Calculation (min) 45 min    Activity Tolerance Patient tolerated treatment well    Behavior During Therapy [facility] for tasks assessed/performed          Past Medical History:  Diagnosis Date   Abnormal liver enzymes 02/08/2016   Allergy    Anxiety    Chest pain 07/05/2016   Chronic pain    Depression    DJD (degenerative joint disease)    lumbar   Fibromyalgia    High cholesterol    Rash of back 01/28/2016   Weight loss due to medication    Past Surgical History:  Procedure Laterality Date   ANKLE SURGERY     x 2   BREAST BIOPSY Left 1997   CHOLECYSTECTOMY     COLONOSCOPY     COLONOSCOPY WITH PROPOFOL  N/A 02/22/2016   Procedure: COLONOSCOPY WITH PROPOFOL ;  Surgeon: Gladis RAYMOND Mariner, MD;  Location: [facility] ENDOSCOPY;  Service: Endoscopy;  Laterality: N/A;   ESOPHAGOGASTRODUODENOSCOPY (EGD) WITH PROPOFOL  N/A 02/22/2016   Procedure: ESOPHAGOGASTRODUODENOSCOPY (EGD) WITH PROPOFOL ;  Surgeon: Gladis RAYMOND Mariner, MD;  Location: [facility] ENDOSCOPY;  Service: Endoscopy;  Laterality: N/A;   NASAL SINUS SURGERY     SHOULDER ARTHROSCOPY WITH ROTATOR CUFF REPAIR AND SUBACROMIAL DECOMPRESSION Left 09/23/2019   Procedure: SHOULDER ARTHROSCOPY WITH MINI ROTATOR CUFF REPAIR AND SUBACROMIAL DECOMPRESSION, BICEPS TENODESIS;  Surgeon: Tobie Priest, MD;  Location: [facility];  Service: Orthopedics;  Laterality: Left;   TONSILLECTOMY     TUBAL LIGATION     Patient Active Problem List   Diagnosis Date Noted   Dyspepsia 10/15/2023   Hot flash, menopausal 09/03/2023   Chronic bronchitis (HCC) 09/03/2023   Piriformis syndrome of right side 08/20/2023    Oral thrush 05/15/2023   Acute maxillary sinusitis 05/15/2023   Chronic radicular lumbar pain 03/20/2023   Exposure to sexually transmitted disease (STD) 01/25/2023   Acute vaginitis 01/25/2023   Abnormal cervical Papanicolaou smear 01/25/2023   Abnormal MRI, cervical spine (09/04/2017) 07/22/2019   Neurogenic pain 07/22/2019   DDD (degenerative disc disease), cervical 06/09/2019   Chronic musculoskeletal pain 05/14/2019   Trypanophobia (Needle phobia) 09/26/2018   Spondylosis without myelopathy or radiculopathy, lumbar region 08/29/2018   Lumbar facet hypertrophy 08/29/2018   Lumbar facet syndrome (Right) 08/29/2018   DDD (degenerative disc disease), thoracic 08/29/2018   Thoracic spondylosis with radiculopathy (Right) 08/29/2018   Spinal enthesopathy of lumbar region (HCC) 08/29/2018   Chronic midline low back pain without sciatica 08/29/2018   Generalized anxiety disorder 07/23/2018   Cervical facet hypertrophy (Bilateral) 07/18/2018   Cervical central spinal stenosis 07/18/2018   Chronic shoulder pain (Left) 07/09/2018   Cervicalgia (Bilateral) (L>R) 03/28/2018   Spondylosis without myelopathy or radiculopathy, cervical region 02/11/2018   Chronic pelvic pain in female 08/24/2017   Fatty liver 08/24/2017   Chronic low back pain (Bilateral) (L>R) 08/13/2017   Cervical Foraminal Stenosis (Severe) (Bilateral: C5-6) 08/13/2017   Cervical spondylosis with radiculopathy (Bilateral) (L>R) 08/13/2017   Cervical sensory radiculopathy at C5 (Bilateral) 08/13/2017   Cervical radicular pain 08/13/2017   Constipation 07/16/2017  Myofascial pain syndrome, cervical (trapezius) (Left) 05/28/2017   Cervical facet syndrome (Bilateral) (L>R) 05/28/2017   Muscle spasm 05/15/2017   Chronic hip pain (Right) 03/19/2017   Chronic knee arthropathy (Right) 03/14/2017   Osteoarthritis of knee (Right) 02/21/2017   Osteoarthritis of shoulder (Right) 01/31/2017   Shortness of breath 01/15/2017    Thoracic spondylosis 01/01/2017   Radicular pain of thoracic region 12/21/2016   Osteoarthritis of hip (Bilateral) 11/20/2016   Greater trochanteric bursitis (Right) 09/18/2016   Subacromial bursitis of shoulder joint (Right) 09/18/2016   Overweight (BMI 25.0-29.9) 09/14/2016   Elevated sedimentation rate 08/31/2016   Elevated C-reactive protein (CRP) 08/31/2016   Long term current use of opiate analgesic 07/20/2016   Long term prescription opiate use 07/20/2016   Opiate use 07/20/2016   Chronic upper back pain (Secondary Area of Pain) (Bilateral) (L>R) 07/20/2016   Chronic shoulder pain (Tertiary Area of Pain) (Bilateral) (L>R) 07/20/2016   Chronic knee pain (Right) 07/20/2016   Chronic upper extremity pain (Left) 07/20/2016   Chronic lower extremity pain (Bilateral) (L>R) 07/20/2016   Chronic abdominal pain 07/20/2016   Chronic hip pain (Bilateral) (R>L) 05/31/2016   Chronic Greater trochanteric bursitis (Bilateral) (L>R) 05/31/2016   Bursitis of shoulder (Right) 05/17/2016   Skin lesions 04/27/2016   Hyperlipidemia 02/18/2016   Cervical spondylosis (Bilateral) 02/08/2016   Fibromyalgia 02/08/2016   Osteopenia 02/08/2016   Chronic neck pain (Primary Area of Pain) (Bilateral) (L>R) 01/28/2016   Concentration deficit 01/28/2016   Special screening for malignant neoplasms, vagina 01/14/2016   Lumbar spondylosis 01/06/2016   Anxiety and depression 07/11/2015   Chronic pain syndrome 07/11/2015   Combined fat and carbohydrate induced hyperlipemia 07/11/2015    PCP: Dr. GORMAN Cinderella Perry  REFERRING PROVIDER: Dr. Wallie Sherry  REFERRING DIAG:  M54.12 (ICD-10-CM) - Cervical radicular pain  M54.2 (ICD-10-CM) - Cervicalgia  M16.11 (ICD-10-CM) - Primary osteoarthritis of right hip    THERAPY DIAG:  Abnormality of gait and mobility  Difficulty in walking, not elsewhere classified  Muscle weakness (generalized)  Other abnormalities of gait and mobility  Other lack of  coordination  Rationale for Evaluation and Treatment: Rehabilitation  ONSET DATE: 1990's per patient  SUBJECTIVE:                                                                                                                                                                                                         SUBJECTIVE STATEMENT: Patient reports chronic history of R hip pain and cervical pain over the years. Reports went to pelvic PT last year and most  recently went back to [facility]. Reports having canal stenosis and difficulty turning to look behind her. Reports her hip pain is most concerning   Hand dominance: Right  PERTINENT HISTORY:  Patient has cervical and radicular pain into shoulders as well as hip pain. Has received injections with minimal relief. Was referred by Dr. Marcelino and dx of OA in hip and cervical radiculopathy with recent bout of diverticulitis.  PAIN:  Are you having pain? Yes: NPRS scale: 3-4/10 and down to 1-2/10 with oxycodone - 2x/day Pain location: R posterior/lateral Hip pain Pain description: achy, sharp/crampy at time/burning dull at times Aggravating factors: Prolonged walking, prolonged sitting, squatting Relieving factors: pain meds, ice  PRECAUTIONS: Fall  RED FLAGS: None     WEIGHT BEARING RESTRICTIONS: No  FALLS:  Has patient fallen in last 6 months? No  LIVING ENVIRONMENT: Lives with: lives alone Lives in: House/apartment Stairs: Yes: External: 4 steps; on right going up Has following equipment at home: None  OCCUPATION: Not currently-   PLOF: Independent  PATIENT GOALS: Being able to walk better without pain and sleep better.   NEXT MD VISIT: PCP 07/17/2024?  OBJECTIVE:  Note: Objective measures were completed at Evaluation unless otherwise noted.  DIAGNOSTIC FINDINGS:  CLINICAL DATA:  Back   EXAM: MRI CERVICAL, THORACIC AND LUMBAR SPINE WITHOUT CONTRAST   TECHNIQUE: Multiplanar and multiecho pulse sequences of the  cervical spine, to include the craniocervical junction and cervicothoracic junction, and thoracic and lumbar spine, were obtained without intravenous contrast.   COMPARISON:  09/04/2017 MRI cervical spine, 07/11/2016 MRI thoracic spine and 03/02/2016 MRI lumbar spine   FINDINGS: MRI CERVICAL SPINE FINDINGS   Alignment: Straightening and mild reversal of the normal cervical lordosis. Trace anterolisthesis of C2 on C3, C3 on C4, and C4 on C5, which is new from the prior exam and favored to be facet mediated.   Vertebrae: No acute fracture, evidence of discitis, or suspicious osseous lesion.   Cord: Normal signal and morphology.   Posterior Fossa, vertebral arteries, paraspinal tissues: Negative.   Disc levels:   C2-C3: No significant disc bulge. Left facet arthropathy. No spinal canal stenosis or neural foraminal narrowing.   C3-C4: Previously noted right paracentral disc protrusion is no longer seen. No significant disc bulge. Left-greater-than-right facet arthropathy. No spinal canal stenosis or neural foraminal narrowing.   C4-C5: Small central disc protrusion. Facet arthropathy. No spinal canal stenosis. Mild bilateral neural foraminal narrowing.   C5-C6: Mild disc bulge. Ligamentum flavum hypertrophy. Mild facet arthropathy. Mild-to-moderate spinal canal stenosis, which has progressed slightly from the prior exam. Moderate left and mild-to-moderate right neural foraminal narrowing, unchanged.   C6-C7: Mild disc bulge with increased size left paracentral disc extrusion, which indents the left aspect of the thecal sac and ventral spinal cord. Ligamentum flavum hypertrophy. Facet arthropathy. Moderate spinal canal stenosis, which has progressed from the prior exam, with unchanged mild bilateral neural foraminal narrowing.   C7-T1: No significant disc bulge. Left-greater-than-right facet arthropathy. No spinal canal stenosis. Mild left neural foraminal narrowing.    MRI THORACIC SPINE FINDINGS   Alignment: No listhesis. In mild levocurvature of the upper thoracic spine with dextrocurvature of the lower thoracic spine and thoracolumbar junction. Straightening of the normal thoracic kyphosis.   Vertebrae: No acute fracture, evidence of discitis, or suspicious osseous lesion.   Cord:  Normal signal and morphology.   Paraspinal and other soft tissues: Negative.   Disc levels:   T1-T2: Facet arthropathy. No spinal canal stenosis. Moderate to severe right and  moderate left neural foraminal narrowing, unchanged.   T2-T3: Right eccentric disc bulge. Right greater than left facet arthropathy. No spinal canal stenosis. Severe right neural foraminal narrowing, unchanged.   T3-T4: Right eccentric disc bulge. Right greater than facet arthropathy. No spinal canal stenosis. Moderate right neural foraminal narrowing.   T4-T5: Facet arthropathy. No spinal canal stenosis or neural foraminal narrowing.   T5-T6: Left paracentral disc protrusion. No spinal canal stenosis or neural foraminal narrowing.   T6-T7: Small central disc protrusion. No spinal canal stenosis or neural foraminal narrowing.   T7-T8: Small right subarticular disc protrusion. No spinal canal stenosis or neural foraminal narrowing.   T8-T9: Facet arthropathy. No spinal canal stenosis or neural foraminal narrowing.   T9-T10: Moderate facet arthropathy. No spinal canal stenosis. Mild bilateral neural foraminal narrowing.   T10-T11: Moderate to severe facet arthropathy. No spinal canal stenosis. Mild bilateral neural foraminal narrowing.   T11-T12: Moderate facet arthropathy. No spinal canal stenosis. Mild left neural foraminal narrowing.   MRI LUMBAR SPINE FINDINGS   Segmentation:  5 lumbar type vertebral bodies.   Alignment: 5 mm anterolisthesis L4 on L5, which is new from the prior exam. Mild levocurvature.   Vertebrae: No acute fracture, evidence of discitis, or  suspicious osseous lesion.   Conus medullaris and cauda equina: Conus extends to the L1 level. Conus and cauda equina appear normal.   Paraspinal and other soft tissues: Small renal cysts, for which no follow-up is currently indicated. No lymphadenopathy.   Disc levels:   T12-L1: No significant disc bulge. No spinal canal stenosis or neural foraminal narrowing.   L1-L2: No significant disc bulge. No spinal canal stenosis or neural foraminal narrowing.   L2-L3: Minimal disc bulge. No spinal canal stenosis or neural foraminal narrowing.   L3-L4: Mild disc bulge. Mild facet arthropathy. Narrowing of the lateral recesses. Mild spinal canal stenosis, which is new from the prior exam. Mild left-greater-than-right neural foraminal narrowing, similar to the prior.   L4-L5: Grade 1 anterolisthesis with disc unroofing and moderate disc bulge. Severe facet arthropathy. Prominent epidural fat. Effacement of the lateral recesses and moderate to severe spinal canal stenosis, which are new from the prior exam. Mild bilateral neural foraminal narrowing, which has progressed on the left.   L5-S1: No significant disc bulge. No spinal canal stenosis or neural foraminal narrowing.   IMPRESSION: CERVICAL SPINE:   1. C6-C7 moderate spinal canal stenosis, which has progressed from the prior exam, with unchanged mild bilateral neural foraminal narrowing. 2. C5-C6 mild-to-moderate spinal canal stenosis, which has progressed slightly from the prior exam, with unchanged moderate left and mild-to-moderate right neural foraminal narrowing. 3. C4-C5 mild bilateral neural foraminal narrowing.   THORACIC SPINE:   Overall unchanged degenerative findings in the thoracic spine, without significant spinal canal stenosis.   LUMBAR SPINE:   1. L4-L5 moderate to severe spinal canal stenosis, which is new from the prior exam, with effacement of the lateral recesses, likely compressing the descending L5  nerve roots, and mild bilateral neural foraminal narrowing, which has progressed on the left. 2. L3-L4 mild spinal canal stenosis, which is new from the prior exam, with mild left-greater-than-right neural foraminal narrowing.     Electronically Signed   By: Donald Campion M.D.   On: 09/20/2023 20:41  PATIENT SURVEYS:  CLINICAL DATA:  Right hip pain and arthralgia.   EXAM: DG HIP (WITH OR WITHOUT PELVIS) 2-3V RIGHT   COMPARISON:  MRI right hip 02/07/2017   FINDINGS: No acute fracture or dislocation.  Mild to moderate degenerative arthritis of both hips.   IMPRESSION: Mild to moderate degenerative arthritis of both hips.     Electronically Signed   By: Norman Gatlin M.D.   On: 04/13/2024 23:52  COGNITION: Overall cognitive status: Within functional limits for tasks assessed  SENSATION: WFL  POSTURE: rounded shoulders, forward head, and weight shift left   Strength R/L *5/5 Hip flexion *5/5 Hip external rotation *5/5 Hip internal rotation 5/5 Hip extension  *4+/4+ Hip abduction 5/5 Hip adduction 5/5 Knee extension 5/5 Knee flexion 5/5 Ankle Dorsiflexion 5/5 Ankle Plantarflexion 5/5 Ankle Inversion 5/5 Ankle Eversion *indicates pain       Muscle Length Hamstring length (degrees): R/L: NT Quad length Arvid): NT Hip Flexor length Cathy): R/L: + Right IT band length Everlene): -  PALPATION: (+) Tenderness just posterior and later to RSIJ, some tenderness in gluteal/piriformis, Gluteal med and TFL and Right hip flexor   FUNCTIONAL TESTS:  5 times sit to stand: 12.44 10 meter walk test: 12.25 sec or 0.82 m/s without AD  GAIT: Distance walked: 100 feet Assistive device utilized: None Level of assistance: Complete Independence Comments: antalgic on Right    TREATMENT DATE: 07/04/2024 PT evaluation Added to HEP: see below section                                                                                                                                PATIENT EDUCATION:  Education details: Anatomy of hip; Purpose of PT; Proposed PT plan of care and interventions to be performed; Education in Risk analyst Person educated: Patient Education method: Explanation, Demonstration, Tactile cues, Verbal cues, and Handouts Education comprehension: verbalized understanding, returned demonstration, verbal cues required, tactile cues required, and needs further education  HOME EXERCISE PROGRAM: Access Code: [email or web address] URL: [email or web address] Date: 07/04/2024 Prepared by: Reyes London  Exercises - Supine Hip Internal and External Rotation  - 1 x daily - 3 sets - 10 reps - Supine Lower Trunk Rotation  - 1 x daily - 3 sets - 10 reps - Hip Flexor Stretch at Edge of Bed  - 1 x daily - 3 sets - 30 hold  ASSESSMENT:  CLINICAL IMPRESSION: Patient is a 63 y.o. female who was seen today for physical therapy evaluation and treatment for Right hip pain and cervical pain and radicular symptoms. When asked which is most concerning- she responded Right hip so today's evaluation concentrated on examining her Right hip. Patient presents with right hip pain limiting mobility  that is chronic in nature with tenderness along posterior, lateral, and anterior thigh. She presents with good overall strength but tight hip flexors and piriformis. She will need further testing for mobility and add to goals as appropriate. She will also need to be evaluated for her diagnosis of cervical pain next visit.  Pt will benefit from PT services to address her muscle tightness, mobility, and pain in order to return to full function at home with  less pain.  OBJECTIVE IMPAIRMENTS: Abnormal gait, decreased activity tolerance, decreased balance, decreased endurance, decreased mobility, difficulty walking, decreased ROM, hypomobility, impaired flexibility, postural dysfunction, and pain.   ACTIVITY LIMITATIONS: carrying, lifting, bending, sitting, standing,  squatting, sleeping, stairs, transfers, and bed mobility  PARTICIPATION LIMITATIONS: meal prep, cleaning, laundry, driving, shopping, community activity, and yard work  PERSONAL FACTORS: Past/current experiences, Time since onset of injury/illness/exacerbation, and 3+ comorbidities: chronic pain syndrome, Lumbar and cervical spondylosis, anxiety are also affecting patient's functional outcome.   REHAB POTENTIAL: Good  CLINICAL DECISION MAKING: Evolving/moderate complexity  EVALUATION COMPLEXITY: Moderate   GOALS: Goals reviewed with patient? Yes  SHORT TERM GOALS: Target date: 08/15/2024  Pt will be independent with HEP in order to decrease ankle pain and increase strength in order to improve pain-free function at home. Baseline: EVAL- No formal HEP in place Goal status: INITIAL   LONG TERM GOALS: Target date: 09/26/2024  Pt will increase LEFS by at least 9 points in order to demonstrate significant improvement in lower extremity function.  Baseline: EVAL= 36/80 Goal status: INITIAL  2.  Pt will decrease worst pain as reported on NPRS by at least 2 points in order to demonstrate clinically significant reduction in R hip pain.  Baseline: EVAL= 4/10 right hip pain Goal status: INITIAL  3.  Pt will demonstrate improved gait speed > 1.0 m/s normal speed for improved functional mobility in community. Baseline: EVAL =0.82 m/s Goal status: INITIAL    PLAN:  PT FREQUENCY: 1-2x/week  PT DURATION: 12 weeks  PLANNED INTERVENTIONS: [id number]- PT Re-evaluation, 97750- Physical Performance Testing, 97110-Therapeutic exercises, 97530- Therapeutic activity, [id number]- Neuromuscular re-education, 97535- Self Care, [id number]- Manual therapy, [id number]- Gait training, [id number]- Orthotic Initial, [phone]- Orthotic/Prosthetic subsequent, [phone]- Canalith repositioning, [id number]- Electrical stimulation (manual), [id number]- Traction (mechanical), 20560 (1-2 muscles), 20561 (3+ muscles)- Dry Needling, Patient/Family  education, Balance training, Stair training, Taping, Joint mobilization, Joint manipulation, Spinal manipulation, Spinal mobilization, Vestibular training, Cryotherapy, and Moist heat  PLAN FOR NEXT SESSION:  Continue with EVALUATION- CERVICAL SPINE and add goals as appropriate.  Continue with hip ROM/pain relieving techniques/modalities Progress HEP as appropriate.     Reyes LOISE London, PT 07/04/2024, 12:22 PM

## 2024-07-04 ENCOUNTER — Ambulatory Visit: Attending: Student in an Organized Health Care Education/Training Program

## 2024-07-04 DIAGNOSIS — R262 Difficulty in walking, not elsewhere classified: Secondary | ICD-10-CM | POA: Diagnosis present

## 2024-07-04 DIAGNOSIS — R278 Other lack of coordination: Secondary | ICD-10-CM | POA: Diagnosis present

## 2024-07-04 DIAGNOSIS — M542 Cervicalgia: Secondary | ICD-10-CM | POA: Diagnosis present

## 2024-07-04 DIAGNOSIS — M25551 Pain in right hip: Secondary | ICD-10-CM | POA: Diagnosis present

## 2024-07-04 DIAGNOSIS — M6281 Muscle weakness (generalized): Secondary | ICD-10-CM | POA: Insufficient documentation

## 2024-07-04 DIAGNOSIS — M1611 Unilateral primary osteoarthritis, right hip: Secondary | ICD-10-CM | POA: Insufficient documentation

## 2024-07-04 DIAGNOSIS — M5412 Radiculopathy, cervical region: Secondary | ICD-10-CM | POA: Insufficient documentation

## 2024-07-04 DIAGNOSIS — R269 Unspecified abnormalities of gait and mobility: Secondary | ICD-10-CM | POA: Insufficient documentation

## 2024-07-04 DIAGNOSIS — R2689 Other abnormalities of gait and mobility: Secondary | ICD-10-CM | POA: Diagnosis present

## 2024-07-08 ENCOUNTER — Ambulatory Visit: Payer: Self-pay | Admitting: Internal Medicine

## 2024-07-09 ENCOUNTER — Encounter: Admitting: Physical Therapy

## 2024-07-10 ENCOUNTER — Ambulatory Visit: Attending: Student in an Organized Health Care Education/Training Program

## 2024-07-10 DIAGNOSIS — M542 Cervicalgia: Secondary | ICD-10-CM | POA: Diagnosis present

## 2024-07-10 DIAGNOSIS — R278 Other lack of coordination: Secondary | ICD-10-CM | POA: Insufficient documentation

## 2024-07-10 DIAGNOSIS — R269 Unspecified abnormalities of gait and mobility: Secondary | ICD-10-CM | POA: Diagnosis present

## 2024-07-10 DIAGNOSIS — R200 Anesthesia of skin: Secondary | ICD-10-CM | POA: Insufficient documentation

## 2024-07-10 DIAGNOSIS — M5459 Other low back pain: Secondary | ICD-10-CM | POA: Diagnosis present

## 2024-07-10 DIAGNOSIS — M25551 Pain in right hip: Secondary | ICD-10-CM | POA: Insufficient documentation

## 2024-07-10 DIAGNOSIS — M6281 Muscle weakness (generalized): Secondary | ICD-10-CM | POA: Diagnosis present

## 2024-07-10 DIAGNOSIS — R262 Difficulty in walking, not elsewhere classified: Secondary | ICD-10-CM | POA: Insufficient documentation

## 2024-07-10 DIAGNOSIS — R2689 Other abnormalities of gait and mobility: Secondary | ICD-10-CM | POA: Insufficient documentation

## 2024-07-10 NOTE — Therapy (Signed)
 OUTPATIENT PHYSICAL THERAPY CERVICAL and HIP TREATMENT   Patient Name: Nicole Walter MRN: [id number] DOB:1961/10/17, 63 y.o., female Today's Date: 07/10/2024  END OF SESSION:  PT End of Session - 07/10/24 0733     Visit Number 2    Number of Visits 24    Date for PT Re-Evaluation 09/26/24    PT Start Time 0731    PT Stop Time 0800    PT Time Calculation (min) 29 min    Activity Tolerance Patient tolerated treatment well    Behavior During Therapy [facility] for tasks assessed/performed           Past Medical History:  Diagnosis Date   Abnormal liver enzymes 02/08/2016   Allergy    Anxiety    Chest pain 07/05/2016   Chronic pain    Depression    DJD (degenerative joint disease)    lumbar   Fibromyalgia    High cholesterol    Rash of back 01/28/2016   Weight loss due to medication    Past Surgical History:  Procedure Laterality Date   ANKLE SURGERY     x 2   BREAST BIOPSY Left 1997   CHOLECYSTECTOMY     COLONOSCOPY     COLONOSCOPY WITH PROPOFOL  N/A 02/22/2016   Procedure: COLONOSCOPY WITH PROPOFOL ;  Surgeon: Gladis RAYMOND Mariner, MD;  Location: [facility] ENDOSCOPY;  Service: Endoscopy;  Laterality: N/A;   ESOPHAGOGASTRODUODENOSCOPY (EGD) WITH PROPOFOL  N/A 02/22/2016   Procedure: ESOPHAGOGASTRODUODENOSCOPY (EGD) WITH PROPOFOL ;  Surgeon: Gladis RAYMOND Mariner, MD;  Location: [facility] ENDOSCOPY;  Service: Endoscopy;  Laterality: N/A;   NASAL SINUS SURGERY     SHOULDER ARTHROSCOPY WITH ROTATOR CUFF REPAIR AND SUBACROMIAL DECOMPRESSION Left 09/23/2019   Procedure: SHOULDER ARTHROSCOPY WITH MINI ROTATOR CUFF REPAIR AND SUBACROMIAL DECOMPRESSION, BICEPS TENODESIS;  Surgeon: Tobie Priest, MD;  Location: [facility];  Service: Orthopedics;  Laterality: Left;   TONSILLECTOMY     TUBAL LIGATION     Patient Active Problem List   Diagnosis Date Noted   Dyspepsia 10/15/2023   Hot flash, menopausal 09/03/2023   Chronic bronchitis (HCC) 09/03/2023   Piriformis syndrome of right side 08/20/2023    Oral thrush 05/15/2023   Acute maxillary sinusitis 05/15/2023   Chronic radicular lumbar pain 03/20/2023   Exposure to sexually transmitted disease (STD) 01/25/2023   Acute vaginitis 01/25/2023   Abnormal cervical Papanicolaou smear 01/25/2023   Abnormal MRI, cervical spine (09/04/2017) 07/22/2019   Neurogenic pain 07/22/2019   DDD (degenerative disc disease), cervical 06/09/2019   Chronic musculoskeletal pain 05/14/2019   Trypanophobia (Needle phobia) 09/26/2018   Spondylosis without myelopathy or radiculopathy, lumbar region 08/29/2018   Lumbar facet hypertrophy 08/29/2018   Lumbar facet syndrome (Right) 08/29/2018   DDD (degenerative disc disease), thoracic 08/29/2018   Thoracic spondylosis with radiculopathy (Right) 08/29/2018   Spinal enthesopathy of lumbar region (HCC) 08/29/2018   Chronic midline low back pain without sciatica 08/29/2018   Generalized anxiety disorder 07/23/2018   Cervical facet hypertrophy (Bilateral) 07/18/2018   Cervical central spinal stenosis 07/18/2018   Chronic shoulder pain (Left) 07/09/2018   Cervicalgia (Bilateral) (L>R) 03/28/2018   Spondylosis without myelopathy or radiculopathy, cervical region 02/11/2018   Chronic pelvic pain in female 08/24/2017   Fatty liver 08/24/2017   Chronic low back pain (Bilateral) (L>R) 08/13/2017   Cervical Foraminal Stenosis (Severe) (Bilateral: C5-6) 08/13/2017   Cervical spondylosis with radiculopathy (Bilateral) (L>R) 08/13/2017   Cervical sensory radiculopathy at C5 (Bilateral) 08/13/2017   Cervical radicular pain 08/13/2017   Constipation 07/16/2017  Myofascial pain syndrome, cervical (trapezius) (Left) 05/28/2017   Cervical facet syndrome (Bilateral) (L>R) 05/28/2017   Muscle spasm 05/15/2017   Chronic hip pain (Right) 03/19/2017   Chronic knee arthropathy (Right) 03/14/2017   Osteoarthritis of knee (Right) 02/21/2017   Osteoarthritis of shoulder (Right) 01/31/2017   Shortness of breath 01/15/2017    Thoracic spondylosis 01/01/2017   Radicular pain of thoracic region 12/21/2016   Osteoarthritis of hip (Bilateral) 11/20/2016   Greater trochanteric bursitis (Right) 09/18/2016   Subacromial bursitis of shoulder joint (Right) 09/18/2016   Overweight (BMI 25.0-29.9) 09/14/2016   Elevated sedimentation rate 08/31/2016   Elevated C-reactive protein (CRP) 08/31/2016   Long term current use of opiate analgesic 07/20/2016   Long term prescription opiate use 07/20/2016   Opiate use 07/20/2016   Chronic upper back pain (Secondary Area of Pain) (Bilateral) (L>R) 07/20/2016   Chronic shoulder pain (Tertiary Area of Pain) (Bilateral) (L>R) 07/20/2016   Chronic knee pain (Right) 07/20/2016   Chronic upper extremity pain (Left) 07/20/2016   Chronic lower extremity pain (Bilateral) (L>R) 07/20/2016   Chronic abdominal pain 07/20/2016   Chronic hip pain (Bilateral) (R>L) 05/31/2016   Chronic Greater trochanteric bursitis (Bilateral) (L>R) 05/31/2016   Bursitis of shoulder (Right) 05/17/2016   Skin lesions 04/27/2016   Hyperlipidemia 02/18/2016   Cervical spondylosis (Bilateral) 02/08/2016   Fibromyalgia 02/08/2016   Osteopenia 02/08/2016   Chronic neck pain (Primary Area of Pain) (Bilateral) (L>R) 01/28/2016   Concentration deficit 01/28/2016   Special screening for malignant neoplasms, vagina 01/14/2016   Lumbar spondylosis 01/06/2016   Anxiety and depression 07/11/2015   Chronic pain syndrome 07/11/2015   Combined fat and carbohydrate induced hyperlipemia 07/11/2015    PCP: Dr. GORMAN Cinderella Perry  REFERRING PROVIDER: Dr. Wallie Sherry  REFERRING DIAG:  M54.12 (ICD-10-CM) - Cervical radicular pain  M54.2 (ICD-10-CM) - Cervicalgia  M16.11 (ICD-10-CM) - Primary osteoarthritis of right hip    THERAPY DIAG:  Abnormality of gait and mobility  Difficulty in walking, not elsewhere classified  Muscle weakness (generalized)  Other abnormalities of gait and mobility  Other lack of  coordination  Pain in right hip  Cervicalgia  Rationale for Evaluation and Treatment: Rehabilitation  ONSET DATE: 1990's per patient  SUBJECTIVE:                                                                                                                                                                                                         SUBJECTIVE STATEMENT: Pt arrived 15 minutes late to the clinic and reports that it is because she took  her sleeping pill for the first time last night and it did not kick in until 3AM.  Pt also reports that the piriformis stretch she was given caused her to be sore the next day.  Hand dominance: Right  PERTINENT HISTORY:  Patient has cervical and radicular pain into shoulders as well as hip pain. Has received injections with minimal relief. Was referred by Dr. Marcelino and dx of OA in hip and cervical radiculopathy with recent bout of diverticulitis.  PAIN:  Are you having pain? Yes: NPRS scale: 3-4/10 and down to 1-2/10 with oxycodone - 2x/day Pain location: R posterior/lateral Hip pain Pain description: achy, sharp/crampy at time/burning dull at times Aggravating factors: Prolonged walking, prolonged sitting, squatting Relieving factors: pain meds, ice  PRECAUTIONS: Fall  RED FLAGS: None     WEIGHT BEARING RESTRICTIONS: No  FALLS:  Has patient fallen in last 6 months? No  LIVING ENVIRONMENT: Lives with: lives alone Lives in: House/apartment Stairs: Yes: External: 4 steps; on right going up Has following equipment at home: None  OCCUPATION: Not currently-   PLOF: Independent  PATIENT GOALS: Being able to walk better without pain and sleep better.   NEXT MD VISIT: PCP 07/17/2024?  OBJECTIVE:  Note: Objective measures were completed at Evaluation unless otherwise noted.  DIAGNOSTIC FINDINGS:  CLINICAL DATA:  Back   EXAM: MRI CERVICAL, THORACIC AND LUMBAR SPINE WITHOUT CONTRAST   TECHNIQUE: Multiplanar and multiecho pulse  sequences of the cervical spine, to include the craniocervical junction and cervicothoracic junction, and thoracic and lumbar spine, were obtained without intravenous contrast.   COMPARISON:  09/04/2017 MRI cervical spine, 07/11/2016 MRI thoracic spine and 03/02/2016 MRI lumbar spine   FINDINGS: MRI CERVICAL SPINE FINDINGS   Alignment: Straightening and mild reversal of the normal cervical lordosis. Trace anterolisthesis of C2 on C3, C3 on C4, and C4 on C5, which is new from the prior exam and favored to be facet mediated.   Vertebrae: No acute fracture, evidence of discitis, or suspicious osseous lesion.   Cord: Normal signal and morphology.   Posterior Fossa, vertebral arteries, paraspinal tissues: Negative.   Disc levels:   C2-C3: No significant disc bulge. Left facet arthropathy. No spinal canal stenosis or neural foraminal narrowing.   C3-C4: Previously noted right paracentral disc protrusion is no longer seen. No significant disc bulge. Left-greater-than-right facet arthropathy. No spinal canal stenosis or neural foraminal narrowing.   C4-C5: Small central disc protrusion. Facet arthropathy. No spinal canal stenosis. Mild bilateral neural foraminal narrowing.   C5-C6: Mild disc bulge. Ligamentum flavum hypertrophy. Mild facet arthropathy. Mild-to-moderate spinal canal stenosis, which has progressed slightly from the prior exam. Moderate left and mild-to-moderate right neural foraminal narrowing, unchanged.   C6-C7: Mild disc bulge with increased size left paracentral disc extrusion, which indents the left aspect of the thecal sac and ventral spinal cord. Ligamentum flavum hypertrophy. Facet arthropathy. Moderate spinal canal stenosis, which has progressed from the prior exam, with unchanged mild bilateral neural foraminal narrowing.   C7-T1: No significant disc bulge. Left-greater-than-right facet arthropathy. No spinal canal stenosis. Mild left neural  foraminal narrowing.   MRI THORACIC SPINE FINDINGS   Alignment: No listhesis. In mild levocurvature of the upper thoracic spine with dextrocurvature of the lower thoracic spine and thoracolumbar junction. Straightening of the normal thoracic kyphosis.   Vertebrae: No acute fracture, evidence of discitis, or suspicious osseous lesion.   Cord:  Normal signal and morphology.   Paraspinal and other soft tissues: Negative.   Disc levels:  T1-T2: Facet arthropathy. No spinal canal stenosis. Moderate to severe right and moderate left neural foraminal narrowing, unchanged.   T2-T3: Right eccentric disc bulge. Right greater than left facet arthropathy. No spinal canal stenosis. Severe right neural foraminal narrowing, unchanged.   T3-T4: Right eccentric disc bulge. Right greater than facet arthropathy. No spinal canal stenosis. Moderate right neural foraminal narrowing.   T4-T5: Facet arthropathy. No spinal canal stenosis or neural foraminal narrowing.   T5-T6: Left paracentral disc protrusion. No spinal canal stenosis or neural foraminal narrowing.   T6-T7: Small central disc protrusion. No spinal canal stenosis or neural foraminal narrowing.   T7-T8: Small right subarticular disc protrusion. No spinal canal stenosis or neural foraminal narrowing.   T8-T9: Facet arthropathy. No spinal canal stenosis or neural foraminal narrowing.   T9-T10: Moderate facet arthropathy. No spinal canal stenosis. Mild bilateral neural foraminal narrowing.   T10-T11: Moderate to severe facet arthropathy. No spinal canal stenosis. Mild bilateral neural foraminal narrowing.   T11-T12: Moderate facet arthropathy. No spinal canal stenosis. Mild left neural foraminal narrowing.   MRI LUMBAR SPINE FINDINGS   Segmentation:  5 lumbar type vertebral bodies.   Alignment: 5 mm anterolisthesis L4 on L5, which is new from the prior exam. Mild levocurvature.   Vertebrae: No acute fracture,  evidence of discitis, or suspicious osseous lesion.   Conus medullaris and cauda equina: Conus extends to the L1 level. Conus and cauda equina appear normal.   Paraspinal and other soft tissues: Small renal cysts, for which no follow-up is currently indicated. No lymphadenopathy.   Disc levels:   T12-L1: No significant disc bulge. No spinal canal stenosis or neural foraminal narrowing.   L1-L2: No significant disc bulge. No spinal canal stenosis or neural foraminal narrowing.   L2-L3: Minimal disc bulge. No spinal canal stenosis or neural foraminal narrowing.   L3-L4: Mild disc bulge. Mild facet arthropathy. Narrowing of the lateral recesses. Mild spinal canal stenosis, which is new from the prior exam. Mild left-greater-than-right neural foraminal narrowing, similar to the prior.   L4-L5: Grade 1 anterolisthesis with disc unroofing and moderate disc bulge. Severe facet arthropathy. Prominent epidural fat. Effacement of the lateral recesses and moderate to severe spinal canal stenosis, which are new from the prior exam. Mild bilateral neural foraminal narrowing, which has progressed on the left.   L5-S1: No significant disc bulge. No spinal canal stenosis or neural foraminal narrowing.   IMPRESSION: CERVICAL SPINE:   1. C6-C7 moderate spinal canal stenosis, which has progressed from the prior exam, with unchanged mild bilateral neural foraminal narrowing. 2. C5-C6 mild-to-moderate spinal canal stenosis, which has progressed slightly from the prior exam, with unchanged moderate left and mild-to-moderate right neural foraminal narrowing. 3. C4-C5 mild bilateral neural foraminal narrowing.   THORACIC SPINE:   Overall unchanged degenerative findings in the thoracic spine, without significant spinal canal stenosis.   LUMBAR SPINE:   1. L4-L5 moderate to severe spinal canal stenosis, which is new from the prior exam, with effacement of the lateral recesses,  likely compressing the descending L5 nerve roots, and mild bilateral neural foraminal narrowing, which has progressed on the left. 2. L3-L4 mild spinal canal stenosis, which is new from the prior exam, with mild left-greater-than-right neural foraminal narrowing.     Electronically Signed   By: Donald Campion M.D.   On: 09/20/2023 20:41  PATIENT SURVEYS:  CLINICAL DATA:  Right hip pain and arthralgia.   EXAM: DG HIP (WITH OR WITHOUT PELVIS) 2-3V RIGHT   COMPARISON:  MRI right hip 02/07/2017   FINDINGS: No acute fracture or dislocation. Mild to moderate degenerative arthritis of both hips.   IMPRESSION: Mild to moderate degenerative arthritis of both hips.     Electronically Signed   By: Norman Gatlin M.D.   On: 04/13/2024 23:52  COGNITION: Overall cognitive status: Within functional limits for tasks assessed  SENSATION: WFL  POSTURE: rounded shoulders, forward head, and weight shift left   Strength R/L *5/5 Hip flexion *5/5 Hip external rotation *5/5 Hip internal rotation 5/5 Hip extension  *4+/4+ Hip abduction 5/5 Hip adduction 5/5 Knee extension 5/5 Knee flexion 5/5 Ankle Dorsiflexion 5/5 Ankle Plantarflexion 5/5 Ankle Inversion 5/5 Ankle Eversion *indicates pain    Muscle Length Hamstring length (degrees): R/L: NT Quad length Arvid): NT Hip Flexor length Cathy): R/L: + Right IT band length Everlene): -   CERVICAL ROM:   Active ROM A/PROM (deg) eval  Flexion 18  Extension 32  Right lateral flexion 32  Left lateral flexion 31  Right rotation 72  Left rotation 59   (Blank rows = not tested)   PALPATION: (+) Tenderness just posterior and later to RSIJ, some tenderness in gluteal/piriformis, Gluteal med and TFL and Right hip flexor   FUNCTIONAL TESTS:  5 times sit to stand: 12.44 10 meter walk test: 12.25 sec or 0.82 m/s without AD  GAIT: Distance walked: 100 feet Assistive device utilized: None Level of assistance: Complete  Independence Comments: antalgic on Right    TREATMENT DATE: 07/04/2024   TherEx:  Overpressure applied by therapist:  Supine B hamstring stretch, SLR, 30 sec bouts Supine B hamstring stretch, bent knee, 30 sec bouts Supine B figure 4 stretch, 30 sec bouts Supine B piriformis stretch, 30 sec bouts  Supine bridge with glute squeeze, 2x10 Sidelying clamshells, 2x10 each LE Sidelying reverse clamshells, 2x10 each LE     PATIENT EDUCATION:  Education details: Anatomy of hip; Purpose of PT; Proposed PT plan of care and interventions to be performed; Education in Risk analyst Person educated: Patient Education method: Explanation, Demonstration, Tactile cues, Verbal cues, and Handouts Education comprehension: verbalized understanding, returned demonstration, verbal cues required, tactile cues required, and needs further education  HOME EXERCISE PROGRAM: Access Code: [email or web address] URL: [email or web address] Date: 07/04/2024 Prepared by: Reyes London & Updated Sidra Simpers  Exercises - Supine Hip Internal and External Rotation  - 1 x daily - 3 sets - 10 reps - Supine Lower Trunk Rotation  - 1 x daily - 3 sets - 10 reps - Hip Flexor Stretch at Edge of Bed  - 1 x daily - 3 sets - 30 hold - Supine Bridge  - 1 x daily - 7 x weekly - 3 sets - 10 reps - Clamshell  - 1 x daily - 7 x weekly - 3 sets - 10 reps - Sidelying Reverse Clamshell  - 1 x daily - 7 x weekly - 3 sets - 10 reps  ASSESSMENT:  CLINICAL IMPRESSION:  Treatment session limited due to the pt's late arrival.  Pt was able to be assessed for cervical ROM during the assessment and was able to assist in establishing goal for ROM.  Pt also participated in therapeutic exercises to improve the overall strength and tissue extensibility of the hip bilaterally.  Pt reminded of attendance policy and needing to show up on time and pt voiced understanding.   Pt will continue to benefit from skilled therapy to address  remaining deficits in order to improve overall QoL and return to  PLOF.     OBJECTIVE IMPAIRMENTS: Abnormal gait, decreased activity tolerance, decreased balance, decreased endurance, decreased mobility, difficulty walking, decreased ROM, hypomobility, impaired flexibility, postural dysfunction, and pain.   ACTIVITY LIMITATIONS: carrying, lifting, bending, sitting, standing, squatting, sleeping, stairs, transfers, and bed mobility  PARTICIPATION LIMITATIONS: meal prep, cleaning, laundry, driving, shopping, community activity, and yard work  PERSONAL FACTORS: Past/current experiences, Time since onset of injury/illness/exacerbation, and 3+ comorbidities: chronic pain syndrome, Lumbar and cervical spondylosis, anxiety are also affecting patient's functional outcome.   REHAB POTENTIAL: Good  CLINICAL DECISION MAKING: Evolving/moderate complexity  EVALUATION COMPLEXITY: Moderate   GOALS: Goals reviewed with patient? Yes  SHORT TERM GOALS: Target date: 08/15/2024  Pt will be independent with HEP in order to decrease ankle pain and increase strength in order to improve pain-free function at home. Baseline: EVAL- No formal HEP in place Goal status: INITIAL   LONG TERM GOALS: Target date: 09/26/2024  Pt will increase LEFS by at least 9 points in order to demonstrate significant improvement in lower extremity function.  Baseline: EVAL= 36/80 Goal status: INITIAL  2.  Pt will decrease worst pain as reported on NPRS by at least 2 points in order to demonstrate clinically significant reduction in R hip pain.  Baseline: EVAL= 4/10 right hip pain Goal status: INITIAL  3.  Pt will demonstrate improved gait speed > 1.0 m/s normal speed for improved functional mobility in community. Baseline: EVAL =0.82 m/s Goal status: INITIAL  4.  Pt will demonstrate increased active cervical ROM to at least 28 flexion, 42 extension, and 69 left rotation to improve ability to perform ADLs such as driving  and head turning with reduced restriction.  Baseline: Flexion: 18; Extension: 32 deg; L Rotation: 59 deg    PLAN:  PT FREQUENCY: 1-2x/week  PT DURATION: 12 weeks  PLANNED INTERVENTIONS: 97164- PT Re-evaluation, 97750- Physical Performance Testing, 97110-Therapeutic exercises, 97530- Therapeutic activity, [id number]- Neuromuscular re-education, 97535- Self Care, [id number]- Manual therapy, [id number]- Gait training, [id number]- Orthotic Initial, [id number]- Orthotic/Prosthetic subsequent, [phone]- Canalith repositioning, [id number]- Electrical stimulation (manual), [id number]- Traction (mechanical), 20560 (1-2 muscles), 20561 (3+ muscles)- Dry Needling, Patient/Family education, Balance training, Stair training, Taping, Joint mobilization, Joint manipulation, Spinal manipulation, Spinal mobilization, Vestibular training, Cryotherapy, and Moist heat  PLAN FOR NEXT SESSION:   Continue with EVALUATION- CERVICAL SPINE and add goals as appropriate.  Continue with hip ROM/pain relieving techniques/modalities Progress HEP as appropriate.     Fonda Simpers, PT, DPT Physical Therapist - [facility]  07/10/24, 2:48 PM

## 2024-07-14 ENCOUNTER — Encounter: Admitting: Physical Therapy

## 2024-07-14 ENCOUNTER — Encounter

## 2024-07-15 ENCOUNTER — Ambulatory Visit

## 2024-07-15 ENCOUNTER — Encounter: Admitting: Internal Medicine

## 2024-07-15 ENCOUNTER — Other Ambulatory Visit: Payer: Self-pay | Admitting: Internal Medicine

## 2024-07-15 DIAGNOSIS — M5459 Other low back pain: Secondary | ICD-10-CM

## 2024-07-15 DIAGNOSIS — R278 Other lack of coordination: Secondary | ICD-10-CM

## 2024-07-15 DIAGNOSIS — M6281 Muscle weakness (generalized): Secondary | ICD-10-CM

## 2024-07-15 DIAGNOSIS — G8929 Other chronic pain: Secondary | ICD-10-CM

## 2024-07-15 DIAGNOSIS — M542 Cervicalgia: Secondary | ICD-10-CM

## 2024-07-15 DIAGNOSIS — M25551 Pain in right hip: Secondary | ICD-10-CM

## 2024-07-15 DIAGNOSIS — R269 Unspecified abnormalities of gait and mobility: Secondary | ICD-10-CM

## 2024-07-15 DIAGNOSIS — R2689 Other abnormalities of gait and mobility: Secondary | ICD-10-CM

## 2024-07-15 DIAGNOSIS — R262 Difficulty in walking, not elsewhere classified: Secondary | ICD-10-CM

## 2024-07-15 DIAGNOSIS — R200 Anesthesia of skin: Secondary | ICD-10-CM

## 2024-07-15 MED ORDER — OXYCODONE HCL 5 MG PO TABS: 5.0000 mg | ORAL_TABLET | Freq: Two times a day (BID) | ORAL | 0 refills | Status: DC | PRN

## 2024-07-15 NOTE — Therapy (Signed)
 OUTPATIENT PHYSICAL THERAPY CERVICAL and HIP TREATMENT   Patient Name: Nicole Walter MRN: [id number] DOB:Oct 24, 1961, 63 y.o., female Today's Date: 07/15/2024  END OF SESSION:  PT End of Session - 07/15/24 0838     Visit Number 3    Number of Visits 24    Date for PT Re-Evaluation 09/26/24    PT Start Time 0815    Activity Tolerance Patient tolerated treatment well    Behavior During Therapy [facility] for tasks assessed/performed           Past Medical History:  Diagnosis Date   Abnormal liver enzymes 02/08/2016   Allergy    Anxiety    Chest pain 07/05/2016   Chronic pain    Depression    DJD (degenerative joint disease)    lumbar   Fibromyalgia    High cholesterol    Rash of back 01/28/2016   Weight loss due to medication    Past Surgical History:  Procedure Laterality Date   ANKLE SURGERY     x 2   BREAST BIOPSY Left 1997   CHOLECYSTECTOMY     COLONOSCOPY     COLONOSCOPY WITH PROPOFOL  N/A 02/22/2016   Procedure: COLONOSCOPY WITH PROPOFOL ;  Surgeon: Gladis RAYMOND Mariner, MD;  Location: [facility] ENDOSCOPY;  Service: Endoscopy;  Laterality: N/A;   ESOPHAGOGASTRODUODENOSCOPY (EGD) WITH PROPOFOL  N/A 02/22/2016   Procedure: ESOPHAGOGASTRODUODENOSCOPY (EGD) WITH PROPOFOL ;  Surgeon: Gladis RAYMOND Mariner, MD;  Location: [facility] ENDOSCOPY;  Service: Endoscopy;  Laterality: N/A;   NASAL SINUS SURGERY     SHOULDER ARTHROSCOPY WITH ROTATOR CUFF REPAIR AND SUBACROMIAL DECOMPRESSION Left 09/23/2019   Procedure: SHOULDER ARTHROSCOPY WITH MINI ROTATOR CUFF REPAIR AND SUBACROMIAL DECOMPRESSION, BICEPS TENODESIS;  Surgeon: Tobie Priest, MD;  Location: [facility];  Service: Orthopedics;  Laterality: Left;   TONSILLECTOMY     TUBAL LIGATION     Patient Active Problem List   Diagnosis Date Noted   Dyspepsia 10/15/2023   Hot flash, menopausal 09/03/2023   Chronic bronchitis (HCC) 09/03/2023   Piriformis syndrome of right side 08/20/2023   Oral thrush 05/15/2023   Acute maxillary sinusitis  05/15/2023   Chronic radicular lumbar pain 03/20/2023   Exposure to sexually transmitted disease (STD) 01/25/2023   Acute vaginitis 01/25/2023   Abnormal cervical Papanicolaou smear 01/25/2023   Abnormal MRI, cervical spine (09/04/2017) 07/22/2019   Neurogenic pain 07/22/2019   DDD (degenerative disc disease), cervical 06/09/2019   Chronic musculoskeletal pain 05/14/2019   Trypanophobia (Needle phobia) 09/26/2018   Spondylosis without myelopathy or radiculopathy, lumbar region 08/29/2018   Lumbar facet hypertrophy 08/29/2018   Lumbar facet syndrome (Right) 08/29/2018   DDD (degenerative disc disease), thoracic 08/29/2018   Thoracic spondylosis with radiculopathy (Right) 08/29/2018   Spinal enthesopathy of lumbar region (HCC) 08/29/2018   Chronic midline low back pain without sciatica 08/29/2018   Generalized anxiety disorder 07/23/2018   Cervical facet hypertrophy (Bilateral) 07/18/2018   Cervical central spinal stenosis 07/18/2018   Chronic shoulder pain (Left) 07/09/2018   Cervicalgia (Bilateral) (L>R) 03/28/2018   Spondylosis without myelopathy or radiculopathy, cervical region 02/11/2018   Chronic pelvic pain in female 08/24/2017   Fatty liver 08/24/2017   Chronic low back pain (Bilateral) (L>R) 08/13/2017   Cervical Foraminal Stenosis (Severe) (Bilateral: C5-6) 08/13/2017   Cervical spondylosis with radiculopathy (Bilateral) (L>R) 08/13/2017   Cervical sensory radiculopathy at C5 (Bilateral) 08/13/2017   Cervical radicular pain 08/13/2017   Constipation 07/16/2017   Myofascial pain syndrome, cervical (trapezius) (Left) 05/28/2017   Cervical facet syndrome (Bilateral) (L>R)  05/28/2017   Muscle spasm 05/15/2017   Chronic hip pain (Right) 03/19/2017   Chronic knee arthropathy (Right) 03/14/2017   Osteoarthritis of knee (Right) 02/21/2017   Osteoarthritis of shoulder (Right) 01/31/2017   Shortness of breath 01/15/2017   Thoracic spondylosis 01/01/2017   Radicular pain of  thoracic region 12/21/2016   Osteoarthritis of hip (Bilateral) 11/20/2016   Greater trochanteric bursitis (Right) 09/18/2016   Subacromial bursitis of shoulder joint (Right) 09/18/2016   Overweight (BMI 25.0-29.9) 09/14/2016   Elevated sedimentation rate 08/31/2016   Elevated C-reactive protein (CRP) 08/31/2016   Long term current use of opiate analgesic 07/20/2016   Long term prescription opiate use 07/20/2016   Opiate use 07/20/2016   Chronic upper back pain (Secondary Area of Pain) (Bilateral) (L>R) 07/20/2016   Chronic shoulder pain (Tertiary Area of Pain) (Bilateral) (L>R) 07/20/2016   Chronic knee pain (Right) 07/20/2016   Chronic upper extremity pain (Left) 07/20/2016   Chronic lower extremity pain (Bilateral) (L>R) 07/20/2016   Chronic abdominal pain 07/20/2016   Chronic hip pain (Bilateral) (R>L) 05/31/2016   Chronic Greater trochanteric bursitis (Bilateral) (L>R) 05/31/2016   Bursitis of shoulder (Right) 05/17/2016   Skin lesions 04/27/2016   Hyperlipidemia 02/18/2016   Cervical spondylosis (Bilateral) 02/08/2016   Fibromyalgia 02/08/2016   Osteopenia 02/08/2016   Chronic neck pain (Primary Area of Pain) (Bilateral) (L>R) 01/28/2016   Concentration deficit 01/28/2016   Special screening for malignant neoplasms, vagina 01/14/2016   Lumbar spondylosis 01/06/2016   Anxiety and depression 07/11/2015   Chronic pain syndrome 07/11/2015   Combined fat and carbohydrate induced hyperlipemia 07/11/2015    PCP: Dr. GORMAN Cinderella Perry  REFERRING PROVIDER: Dr. Wallie Sherry  REFERRING DIAG:  M54.12 (ICD-10-CM) - Cervical radicular pain  M54.2 (ICD-10-CM) - Cervicalgia  M16.11 (ICD-10-CM) - Primary osteoarthritis of right hip    THERAPY DIAG:  Abnormality of gait and mobility  Difficulty in walking, not elsewhere classified  Muscle weakness (generalized)  Other abnormalities of gait and mobility  Other lack of coordination  Pain in right hip  Cervicalgia  Other low  back pain  Numbness in both hands  Rationale for Evaluation and Treatment: Rehabilitation  ONSET DATE: 1990's per patient  SUBJECTIVE:                                                                                                                                                                                                         SUBJECTIVE STATEMENT: Pt arrived 15 minutes late to the clinic and reports that it is because she took her sleeping pill for  the first time last night and it did not kick in until 3AM.  Pt also reports that the piriformis stretch she was given caused her to be sore the next day.  Hand dominance: Right  PERTINENT HISTORY:  Patient has cervical and radicular pain into shoulders as well as hip pain. Has received injections with minimal relief. Was referred by Dr. Marcelino and dx of OA in hip and cervical radiculopathy with recent bout of diverticulitis.  PAIN:  Are you having pain? Yes: NPRS scale: 3-4/10 and down to 1-2/10 with oxycodone - 2x/day Pain location: R posterior/lateral Hip pain Pain description: achy, sharp/crampy at time/burning dull at times Aggravating factors: Prolonged walking, prolonged sitting, squatting Relieving factors: pain meds, ice  PRECAUTIONS: Fall  RED FLAGS: None     WEIGHT BEARING RESTRICTIONS: No  FALLS:  Has patient fallen in last 6 months? No  LIVING ENVIRONMENT: Lives with: lives alone Lives in: House/apartment Stairs: Yes: External: 4 steps; on right going up Has following equipment at home: None  OCCUPATION: Not currently-   PLOF: Independent  PATIENT GOALS: Being able to walk better without pain and sleep better.   NEXT MD VISIT: PCP 07/17/2024?  OBJECTIVE:  Note: Objective measures were completed at Evaluation unless otherwise noted.  DIAGNOSTIC FINDINGS:  CLINICAL DATA:  Back   EXAM: MRI CERVICAL, THORACIC AND LUMBAR SPINE WITHOUT CONTRAST   TECHNIQUE: Multiplanar and multiecho pulse sequences of  the cervical spine, to include the craniocervical junction and cervicothoracic junction, and thoracic and lumbar spine, were obtained without intravenous contrast.   COMPARISON:  09/04/2017 MRI cervical spine, 07/11/2016 MRI thoracic spine and 03/02/2016 MRI lumbar spine   FINDINGS: MRI CERVICAL SPINE FINDINGS   Alignment: Straightening and mild reversal of the normal cervical lordosis. Trace anterolisthesis of C2 on C3, C3 on C4, and C4 on C5, which is new from the prior exam and favored to be facet mediated.   Vertebrae: No acute fracture, evidence of discitis, or suspicious osseous lesion.   Cord: Normal signal and morphology.   Posterior Fossa, vertebral arteries, paraspinal tissues: Negative.   Disc levels:   C2-C3: No significant disc bulge. Left facet arthropathy. No spinal canal stenosis or neural foraminal narrowing.   C3-C4: Previously noted right paracentral disc protrusion is no longer seen. No significant disc bulge. Left-greater-than-right facet arthropathy. No spinal canal stenosis or neural foraminal narrowing.   C4-C5: Small central disc protrusion. Facet arthropathy. No spinal canal stenosis. Mild bilateral neural foraminal narrowing.   C5-C6: Mild disc bulge. Ligamentum flavum hypertrophy. Mild facet arthropathy. Mild-to-moderate spinal canal stenosis, which has progressed slightly from the prior exam. Moderate left and mild-to-moderate right neural foraminal narrowing, unchanged.   C6-C7: Mild disc bulge with increased size left paracentral disc extrusion, which indents the left aspect of the thecal sac and ventral spinal cord. Ligamentum flavum hypertrophy. Facet arthropathy. Moderate spinal canal stenosis, which has progressed from the prior exam, with unchanged mild bilateral neural foraminal narrowing.   C7-T1: No significant disc bulge. Left-greater-than-right facet arthropathy. No spinal canal stenosis. Mild left neural foraminal narrowing.    MRI THORACIC SPINE FINDINGS   Alignment: No listhesis. In mild levocurvature of the upper thoracic spine with dextrocurvature of the lower thoracic spine and thoracolumbar junction. Straightening of the normal thoracic kyphosis.   Vertebrae: No acute fracture, evidence of discitis, or suspicious osseous lesion.   Cord:  Normal signal and morphology.   Paraspinal and other soft tissues: Negative.   Disc levels:   T1-T2: Facet arthropathy. No  spinal canal stenosis. Moderate to severe right and moderate left neural foraminal narrowing, unchanged.   T2-T3: Right eccentric disc bulge. Right greater than left facet arthropathy. No spinal canal stenosis. Severe right neural foraminal narrowing, unchanged.   T3-T4: Right eccentric disc bulge. Right greater than facet arthropathy. No spinal canal stenosis. Moderate right neural foraminal narrowing.   T4-T5: Facet arthropathy. No spinal canal stenosis or neural foraminal narrowing.   T5-T6: Left paracentral disc protrusion. No spinal canal stenosis or neural foraminal narrowing.   T6-T7: Small central disc protrusion. No spinal canal stenosis or neural foraminal narrowing.   T7-T8: Small right subarticular disc protrusion. No spinal canal stenosis or neural foraminal narrowing.   T8-T9: Facet arthropathy. No spinal canal stenosis or neural foraminal narrowing.   T9-T10: Moderate facet arthropathy. No spinal canal stenosis. Mild bilateral neural foraminal narrowing.   T10-T11: Moderate to severe facet arthropathy. No spinal canal stenosis. Mild bilateral neural foraminal narrowing.   T11-T12: Moderate facet arthropathy. No spinal canal stenosis. Mild left neural foraminal narrowing.   MRI LUMBAR SPINE FINDINGS   Segmentation:  5 lumbar type vertebral bodies.   Alignment: 5 mm anterolisthesis L4 on L5, which is new from the prior exam. Mild levocurvature.   Vertebrae: No acute fracture, evidence of discitis, or  suspicious osseous lesion.   Conus medullaris and cauda equina: Conus extends to the L1 level. Conus and cauda equina appear normal.   Paraspinal and other soft tissues: Small renal cysts, for which no follow-up is currently indicated. No lymphadenopathy.   Disc levels:   T12-L1: No significant disc bulge. No spinal canal stenosis or neural foraminal narrowing.   L1-L2: No significant disc bulge. No spinal canal stenosis or neural foraminal narrowing.   L2-L3: Minimal disc bulge. No spinal canal stenosis or neural foraminal narrowing.   L3-L4: Mild disc bulge. Mild facet arthropathy. Narrowing of the lateral recesses. Mild spinal canal stenosis, which is new from the prior exam. Mild left-greater-than-right neural foraminal narrowing, similar to the prior.   L4-L5: Grade 1 anterolisthesis with disc unroofing and moderate disc bulge. Severe facet arthropathy. Prominent epidural fat. Effacement of the lateral recesses and moderate to severe spinal canal stenosis, which are new from the prior exam. Mild bilateral neural foraminal narrowing, which has progressed on the left.   L5-S1: No significant disc bulge. No spinal canal stenosis or neural foraminal narrowing.   IMPRESSION: CERVICAL SPINE:   1. C6-C7 moderate spinal canal stenosis, which has progressed from the prior exam, with unchanged mild bilateral neural foraminal narrowing. 2. C5-C6 mild-to-moderate spinal canal stenosis, which has progressed slightly from the prior exam, with unchanged moderate left and mild-to-moderate right neural foraminal narrowing. 3. C4-C5 mild bilateral neural foraminal narrowing.   THORACIC SPINE:   Overall unchanged degenerative findings in the thoracic spine, without significant spinal canal stenosis.   LUMBAR SPINE:   1. L4-L5 moderate to severe spinal canal stenosis, which is new from the prior exam, with effacement of the lateral recesses, likely compressing the descending L5  nerve roots, and mild bilateral neural foraminal narrowing, which has progressed on the left. 2. L3-L4 mild spinal canal stenosis, which is new from the prior exam, with mild left-greater-than-right neural foraminal narrowing.     Electronically Signed   By: Donald Campion M.D.   On: 09/20/2023 20:41  PATIENT SURVEYS:  CLINICAL DATA:  Right hip pain and arthralgia.   EXAM: DG HIP (WITH OR WITHOUT PELVIS) 2-3V RIGHT   COMPARISON:  MRI right hip 02/07/2017  FINDINGS: No acute fracture or dislocation. Mild to moderate degenerative arthritis of both hips.   IMPRESSION: Mild to moderate degenerative arthritis of both hips.     Electronically Signed   By: Norman Gatlin M.D.   On: 04/13/2024 23:52  COGNITION: Overall cognitive status: Within functional limits for tasks assessed  SENSATION: WFL  POSTURE: rounded shoulders, forward head, and weight shift left   Strength R/L *5/5 Hip flexion *5/5 Hip external rotation *5/5 Hip internal rotation 5/5 Hip extension  *4+/4+ Hip abduction 5/5 Hip adduction 5/5 Knee extension 5/5 Knee flexion 5/5 Ankle Dorsiflexion 5/5 Ankle Plantarflexion 5/5 Ankle Inversion 5/5 Ankle Eversion *indicates pain    Muscle Length Hamstring length (degrees): R/L: NT Quad length Arvid): NT Hip Flexor length Cathy): R/L: + Right IT band length Everlene): -   CERVICAL ROM:   Active ROM A/PROM (deg) eval  Flexion 18  Extension 32  Right lateral flexion 32  Left lateral flexion 31  Right rotation 72  Left rotation 59   (Blank rows = not tested)   PALPATION: (+) Tenderness just posterior and later to RSIJ, some tenderness in gluteal/piriformis, Gluteal med and TFL and Right hip flexor   FUNCTIONAL TESTS:  5 times sit to stand: 12.44 10 meter walk test: 12.25 sec or 0.82 m/s without AD  GAIT: Distance walked: 100 feet Assistive device utilized: None Level of assistance: Complete Independence Comments: antalgic on  Right    TREATMENT DATE: 07/04/2024   TherEx:  Overpressure applied by therapist:  Supine B hamstring stretch, SLR, 30 sec bouts Supine B hamstring stretch, bent knee, 30 sec bouts Supine B figure 4 stretch, 30 sec bouts Supine B piriformis stretch, 30 sec bouts  Supine bridge with glute squeeze, 2x10 Sidelying clamshells, 2x10 each LE Sidelying reverse clamshells, 2x10 each LE     PATIENT EDUCATION:  Education details: Anatomy of hip; Purpose of PT; Proposed PT plan of care and interventions to be performed; Education in Risk analyst Person educated: Patient Education method: Explanation, Demonstration, Tactile cues, Verbal cues, and Handouts Education comprehension: verbalized understanding, returned demonstration, verbal cues required, tactile cues required, and needs further education  HOME EXERCISE PROGRAM: Access Code: [email or web address] URL: [email or web address] Date: 07/15/2024 Prepared by: Reyes London  Exercises - Seated Scapular Retraction  - 3 x weekly - 3 sets - 10 reps - Seated Cervical Retraction  - 3 x weekly - 3 sets - 10 reps - Seated Assisted Cervical Rotation with Towel  - 1 x daily - 3 sets - 30 sec hold      Access Code: [email or web address] URL: [email or web address] Date: 07/04/2024 Prepared by: Reyes London & Updated Sidra Simpers  Exercises - Supine Hip Internal and External Rotation  - 1 x daily - 3 sets - 10 reps - Supine Lower Trunk Rotation  - 1 x daily - 3 sets - 10 reps - Hip Flexor Stretch at Edge of Bed  - 1 x daily - 3 sets - 30 hold - Supine Bridge  - 1 x daily - 7 x weekly - 3 sets - 10 reps - Clamshell  - 1 x daily - 7 x weekly - 3 sets - 10 reps - Sidelying Reverse Clamshell  - 1 x daily - 7 x weekly - 3 sets - 10 reps  ASSESSMENT:  CLINICAL IMPRESSION:  Treatment session limited due to the pt's late arrival.  Pt was able to be assessed for cervical ROM during the assessment and was able to assist in  establishing goal  for ROM.  Pt also participated in therapeutic exercises to improve the overall strength and tissue extensibility of the hip bilaterally.  Pt reminded of attendance policy and needing to show up on time and pt voiced understanding.   Pt will continue to benefit from skilled therapy to address remaining deficits in order to improve overall QoL and return to PLOF.     OBJECTIVE IMPAIRMENTS: Abnormal gait, decreased activity tolerance, decreased balance, decreased endurance, decreased mobility, difficulty walking, decreased ROM, hypomobility, impaired flexibility, postural dysfunction, and pain.   ACTIVITY LIMITATIONS: carrying, lifting, bending, sitting, standing, squatting, sleeping, stairs, transfers, and bed mobility  PARTICIPATION LIMITATIONS: meal prep, cleaning, laundry, driving, shopping, community activity, and yard work  PERSONAL FACTORS: Past/current experiences, Time since onset of injury/illness/exacerbation, and 3+ comorbidities: chronic pain syndrome, Lumbar and cervical spondylosis, anxiety are also affecting patient's functional outcome.   REHAB POTENTIAL: Good  CLINICAL DECISION MAKING: Evolving/moderate complexity  EVALUATION COMPLEXITY: Moderate   GOALS: Goals reviewed with patient? Yes  SHORT TERM GOALS: Target date: 08/15/2024  Pt will be independent with HEP in order to decrease ankle pain and increase strength in order to improve pain-free function at home. Baseline: EVAL- No formal HEP in place Goal status: INITIAL   LONG TERM GOALS: Target date: 09/26/2024  Pt will increase LEFS by at least 9 points in order to demonstrate significant improvement in lower extremity function.  Baseline: EVAL= 36/80 Goal status: INITIAL  2.  Pt will decrease worst pain as reported on NPRS by at least 2 points in order to demonstrate clinically significant reduction in R hip pain.  Baseline: EVAL= 4/10 right hip pain Goal status: INITIAL  3.  Pt will  demonstrate improved gait speed > 1.0 m/s normal speed for improved functional mobility in community. Baseline: EVAL =0.82 m/s Goal status: INITIAL  4.  Pt will demonstrate increased active cervical ROM to at least 28 flexion, 42 extension, and 69 left rotation to improve ability to perform ADLs such as driving and head turning with reduced restriction.  Baseline: Flexion: 18; Extension: 32 deg; L Rotation: 59 deg    PLAN:  PT FREQUENCY: 1-2x/week  PT DURATION: 12 weeks  PLANNED INTERVENTIONS: 97164- PT Re-evaluation, 97750- Physical Performance Testing, 97110-Therapeutic exercises, 97530- Therapeutic activity, [id number]- Neuromuscular re-education, 97535- Self Care, [id number]- Manual therapy, [id number]- Gait training, [id number]- Orthotic Initial, [id number]- Orthotic/Prosthetic subsequent, [phone]- Canalith repositioning, [id number]- Electrical stimulation (manual), [id number]- Traction (mechanical), 20560 (1-2 muscles), 20561 (3+ muscles)- Dry Needling, Patient/Family education, Balance training, Stair training, Taping, Joint mobilization, Joint manipulation, Spinal manipulation, Spinal mobilization, Vestibular training, Cryotherapy, and Moist heat  PLAN FOR NEXT SESSION:    Continue with hip ROM/pain relieving techniques/modalities Progress HEP as appropriate.     Physical Therapist - [facility]  [facility]  07/15/24, 8:39 AM

## 2024-07-16 ENCOUNTER — Encounter

## 2024-07-17 ENCOUNTER — Ambulatory Visit

## 2024-07-17 ENCOUNTER — Telehealth: Payer: Self-pay

## 2024-07-17 DIAGNOSIS — R2689 Other abnormalities of gait and mobility: Secondary | ICD-10-CM

## 2024-07-17 DIAGNOSIS — M542 Cervicalgia: Secondary | ICD-10-CM

## 2024-07-17 DIAGNOSIS — R262 Difficulty in walking, not elsewhere classified: Secondary | ICD-10-CM

## 2024-07-17 DIAGNOSIS — M6281 Muscle weakness (generalized): Secondary | ICD-10-CM

## 2024-07-17 DIAGNOSIS — R269 Unspecified abnormalities of gait and mobility: Secondary | ICD-10-CM

## 2024-07-17 DIAGNOSIS — R278 Other lack of coordination: Secondary | ICD-10-CM

## 2024-07-17 DIAGNOSIS — M5459 Other low back pain: Secondary | ICD-10-CM

## 2024-07-17 DIAGNOSIS — M25551 Pain in right hip: Secondary | ICD-10-CM

## 2024-07-17 NOTE — Telephone Encounter (Signed)
 Pharmacy is asking for rx for covid vaccine to be sent in

## 2024-07-17 NOTE — Therapy (Signed)
 OUTPATIENT PHYSICAL THERAPY CERVICAL and HIP TREATMENT   Patient Name: Nicole Walter MRN: [id number] DOB:[birth year], 63 y.o., female Today's Date: 07/17/2024  END OF SESSION:  PT End of Session - 07/17/24 0810     Visit Number 4    Number of Visits 24    Date for PT Re-Evaluation 09/26/24    PT Start Time 0805    PT Stop Time 0847    PT Time Calculation (min) 42 min    Equipment Utilized During Treatment Gait belt    Activity Tolerance Patient tolerated treatment well    Behavior During Therapy [facility] for tasks assessed/performed           Past Medical History:  Diagnosis Date   Abnormal liver enzymes 02/08/2016   Allergy    Anxiety    Chest pain 07/05/2016   Chronic pain    Depression    DJD (degenerative joint disease)    lumbar   Fibromyalgia    High cholesterol    Rash of back 01/28/2016   Weight loss due to medication    Past Surgical History:  Procedure Laterality Date   ANKLE SURGERY     x 2   BREAST BIOPSY Left 1997   CHOLECYSTECTOMY     COLONOSCOPY     COLONOSCOPY WITH PROPOFOL  N/A 02/22/2016   Procedure: COLONOSCOPY WITH PROPOFOL ;  Surgeon: Gladis RAYMOND Mariner, MD;  Location: [facility] ENDOSCOPY;  Service: Endoscopy;  Laterality: N/A;   ESOPHAGOGASTRODUODENOSCOPY (EGD) WITH PROPOFOL  N/A 02/22/2016   Procedure: ESOPHAGOGASTRODUODENOSCOPY (EGD) WITH PROPOFOL ;  Surgeon: Gladis RAYMOND Mariner, MD;  Location: [facility] ENDOSCOPY;  Service: Endoscopy;  Laterality: N/A;   NASAL SINUS SURGERY     SHOULDER ARTHROSCOPY WITH ROTATOR CUFF REPAIR AND SUBACROMIAL DECOMPRESSION Left 09/23/2019   Procedure: SHOULDER ARTHROSCOPY WITH MINI ROTATOR CUFF REPAIR AND SUBACROMIAL DECOMPRESSION, BICEPS TENODESIS;  Surgeon: Tobie Priest, MD;  Location: [facility];  Service: Orthopedics;  Laterality: Left;   TONSILLECTOMY     TUBAL LIGATION     Patient Active Problem List   Diagnosis Date Noted   Dyspepsia 10/15/2023   Hot flash, menopausal 09/03/2023   Chronic bronchitis (HCC)  09/03/2023   Piriformis syndrome of right side 08/20/2023   Oral thrush 05/15/2023   Acute maxillary sinusitis 05/15/2023   Chronic radicular lumbar pain 03/20/2023   Exposure to sexually transmitted disease (STD) 01/25/2023   Acute vaginitis 01/25/2023   Abnormal cervical Papanicolaou smear 01/25/2023   Abnormal MRI, cervical spine (09/04/2017) 07/22/2019   Neurogenic pain 07/22/2019   DDD (degenerative disc disease), cervical 06/09/2019   Chronic musculoskeletal pain 05/14/2019   Trypanophobia (Needle phobia) 09/26/2018   Spondylosis without myelopathy or radiculopathy, lumbar region 08/29/2018   Lumbar facet hypertrophy 08/29/2018   Lumbar facet syndrome (Right) 08/29/2018   DDD (degenerative disc disease), thoracic 08/29/2018   Thoracic spondylosis with radiculopathy (Right) 08/29/2018   Spinal enthesopathy of lumbar region (HCC) 08/29/2018   Chronic midline low back pain without sciatica 08/29/2018   Generalized anxiety disorder 07/23/2018   Cervical facet hypertrophy (Bilateral) 07/18/2018   Cervical central spinal stenosis 07/18/2018   Chronic shoulder pain (Left) 07/09/2018   Cervicalgia (Bilateral) (L>R) 03/28/2018   Spondylosis without myelopathy or radiculopathy, cervical region 02/11/2018   Chronic pelvic pain in female 08/24/2017   Fatty liver 08/24/2017   Chronic low back pain (Bilateral) (L>R) 08/13/2017   Cervical Foraminal Stenosis (Severe) (Bilateral: C5-6) 08/13/2017   Cervical spondylosis with radiculopathy (Bilateral) (L>R) 08/13/2017   Cervical sensory radiculopathy at C5 (Bilateral) 08/13/2017  Cervical radicular pain 08/13/2017   Constipation 07/16/2017   Myofascial pain syndrome, cervical (trapezius) (Left) 05/28/2017   Cervical facet syndrome (Bilateral) (L>R) 05/28/2017   Muscle spasm 05/15/2017   Chronic hip pain (Right) 03/19/2017   Chronic knee arthropathy (Right) 03/14/2017   Osteoarthritis of knee (Right) 02/21/2017   Osteoarthritis of shoulder  (Right) 01/31/2017   Shortness of breath 01/15/2017   Thoracic spondylosis 01/01/2017   Radicular pain of thoracic region 12/21/2016   Osteoarthritis of hip (Bilateral) 11/20/2016   Greater trochanteric bursitis (Right) 09/18/2016   Subacromial bursitis of shoulder joint (Right) 09/18/2016   Overweight (BMI 25.0-29.9) 09/14/2016   Elevated sedimentation rate 08/31/2016   Elevated C-reactive protein (CRP) 08/31/2016   Long term current use of opiate analgesic 07/20/2016   Long term prescription opiate use 07/20/2016   Opiate use 07/20/2016   Chronic upper back pain (Secondary Area of Pain) (Bilateral) (L>R) 07/20/2016   Chronic shoulder pain (Tertiary Area of Pain) (Bilateral) (L>R) 07/20/2016   Chronic knee pain (Right) 07/20/2016   Chronic upper extremity pain (Left) 07/20/2016   Chronic lower extremity pain (Bilateral) (L>R) 07/20/2016   Chronic abdominal pain 07/20/2016   Chronic hip pain (Bilateral) (R>L) 05/31/2016   Chronic Greater trochanteric bursitis (Bilateral) (L>R) 05/31/2016   Bursitis of shoulder (Right) 05/17/2016   Skin lesions 04/27/2016   Hyperlipidemia 02/18/2016   Cervical spondylosis (Bilateral) 02/08/2016   Fibromyalgia 02/08/2016   Osteopenia 02/08/2016   Chronic neck pain (Primary Area of Pain) (Bilateral) (L>R) 01/28/2016   Concentration deficit 01/28/2016   Special screening for malignant neoplasms, vagina 01/14/2016   Lumbar spondylosis 01/06/2016   Anxiety and depression 07/11/2015   Chronic pain syndrome 07/11/2015   Combined fat and carbohydrate induced hyperlipemia 07/11/2015    PCP: Dr. GORMAN Cinderella Perry  REFERRING PROVIDER: Dr. Wallie Sherry  REFERRING DIAG:  M54.12 (ICD-10-CM) - Cervical radicular pain  M54.2 (ICD-10-CM) - Cervicalgia  M16.11 (ICD-10-CM) - Primary osteoarthritis of right hip    THERAPY DIAG:  Abnormality of gait and mobility  Difficulty in walking, not elsewhere classified  Muscle weakness (generalized)  Other  abnormalities of gait and mobility  Other lack of coordination  Pain in right hip  Cervicalgia  Other low back pain  Rationale for Evaluation and Treatment: Rehabilitation  ONSET DATE: 1990's per patient  SUBJECTIVE:                                                                                                                                                                                                         SUBJECTIVE STATEMENT: Pt  reports pain between the shoulder blades this morning and feeling tight in the hips   Hand dominance: Right  PERTINENT HISTORY:  Patient has cervical and radicular pain into shoulders as well as hip pain. Has received injections with minimal relief. Was referred by Dr. Marcelino and dx of OA in hip and cervical radiculopathy with recent bout of diverticulitis.  PAIN:  Are you having pain? Yes: NPRS scale: 3-4/10 and down to 1-2/10 with oxycodone - 2x/day Pain location: R posterior/lateral Hip pain Pain description: achy, sharp/crampy at time/burning dull at times Aggravating factors: Prolonged walking, prolonged sitting, squatting Relieving factors: pain meds, ice  PRECAUTIONS: Fall  RED FLAGS: None     WEIGHT BEARING RESTRICTIONS: No  FALLS:  Has patient fallen in last 6 months? No  LIVING ENVIRONMENT: Lives with: lives alone Lives in: House/apartment Stairs: Yes: External: 4 steps; on right going up Has following equipment at home: None  OCCUPATION: Not currently-   PLOF: Independent  PATIENT GOALS: Being able to walk better without pain and sleep better.   NEXT MD VISIT: PCP 07/17/2024?  OBJECTIVE:  Note: Objective measures were completed at Evaluation unless otherwise noted.  DIAGNOSTIC FINDINGS:  CLINICAL DATA:  Back   EXAM: MRI CERVICAL, THORACIC AND LUMBAR SPINE WITHOUT CONTRAST   TECHNIQUE: Multiplanar and multiecho pulse sequences of the cervical spine, to include the craniocervical junction and cervicothoracic  junction, and thoracic and lumbar spine, were obtained without intravenous contrast.   COMPARISON:  09/04/2017 MRI cervical spine, 07/11/2016 MRI thoracic spine and 03/02/2016 MRI lumbar spine   FINDINGS: MRI CERVICAL SPINE FINDINGS   Alignment: Straightening and mild reversal of the normal cervical lordosis. Trace anterolisthesis of C2 on C3, C3 on C4, and C4 on C5, which is new from the prior exam and favored to be facet mediated.   Vertebrae: No acute fracture, evidence of discitis, or suspicious osseous lesion.   Cord: Normal signal and morphology.   Posterior Fossa, vertebral arteries, paraspinal tissues: Negative.   Disc levels:   C2-C3: No significant disc bulge. Left facet arthropathy. No spinal canal stenosis or neural foraminal narrowing.   C3-C4: Previously noted right paracentral disc protrusion is no longer seen. No significant disc bulge. Left-greater-than-right facet arthropathy. No spinal canal stenosis or neural foraminal narrowing.   C4-C5: Small central disc protrusion. Facet arthropathy. No spinal canal stenosis. Mild bilateral neural foraminal narrowing.   C5-C6: Mild disc bulge. Ligamentum flavum hypertrophy. Mild facet arthropathy. Mild-to-moderate spinal canal stenosis, which has progressed slightly from the prior exam. Moderate left and mild-to-moderate right neural foraminal narrowing, unchanged.   C6-C7: Mild disc bulge with increased size left paracentral disc extrusion, which indents the left aspect of the thecal sac and ventral spinal cord. Ligamentum flavum hypertrophy. Facet arthropathy. Moderate spinal canal stenosis, which has progressed from the prior exam, with unchanged mild bilateral neural foraminal narrowing.   C7-T1: No significant disc bulge. Left-greater-than-right facet arthropathy. No spinal canal stenosis. Mild left neural foraminal narrowing.   MRI THORACIC SPINE FINDINGS   Alignment: No listhesis. In mild  levocurvature of the upper thoracic spine with dextrocurvature of the lower thoracic spine and thoracolumbar junction. Straightening of the normal thoracic kyphosis.   Vertebrae: No acute fracture, evidence of discitis, or suspicious osseous lesion.   Cord:  Normal signal and morphology.   Paraspinal and other soft tissues: Negative.   Disc levels:   T1-T2: Facet arthropathy. No spinal canal stenosis. Moderate to severe right and moderate left neural foraminal narrowing, unchanged.   T2-T3:  Right eccentric disc bulge. Right greater than left facet arthropathy. No spinal canal stenosis. Severe right neural foraminal narrowing, unchanged.   T3-T4: Right eccentric disc bulge. Right greater than facet arthropathy. No spinal canal stenosis. Moderate right neural foraminal narrowing.   T4-T5: Facet arthropathy. No spinal canal stenosis or neural foraminal narrowing.   T5-T6: Left paracentral disc protrusion. No spinal canal stenosis or neural foraminal narrowing.   T6-T7: Small central disc protrusion. No spinal canal stenosis or neural foraminal narrowing.   T7-T8: Small right subarticular disc protrusion. No spinal canal stenosis or neural foraminal narrowing.   T8-T9: Facet arthropathy. No spinal canal stenosis or neural foraminal narrowing.   T9-T10: Moderate facet arthropathy. No spinal canal stenosis. Mild bilateral neural foraminal narrowing.   T10-T11: Moderate to severe facet arthropathy. No spinal canal stenosis. Mild bilateral neural foraminal narrowing.   T11-T12: Moderate facet arthropathy. No spinal canal stenosis. Mild left neural foraminal narrowing.   MRI LUMBAR SPINE FINDINGS   Segmentation:  5 lumbar type vertebral bodies.   Alignment: 5 mm anterolisthesis L4 on L5, which is new from the prior exam. Mild levocurvature.   Vertebrae: No acute fracture, evidence of discitis, or suspicious osseous lesion.   Conus medullaris and cauda equina: Conus  extends to the L1 level. Conus and cauda equina appear normal.   Paraspinal and other soft tissues: Small renal cysts, for which no follow-up is currently indicated. No lymphadenopathy.   Disc levels:   T12-L1: No significant disc bulge. No spinal canal stenosis or neural foraminal narrowing.   L1-L2: No significant disc bulge. No spinal canal stenosis or neural foraminal narrowing.   L2-L3: Minimal disc bulge. No spinal canal stenosis or neural foraminal narrowing.   L3-L4: Mild disc bulge. Mild facet arthropathy. Narrowing of the lateral recesses. Mild spinal canal stenosis, which is new from the prior exam. Mild left-greater-than-right neural foraminal narrowing, similar to the prior.   L4-L5: Grade 1 anterolisthesis with disc unroofing and moderate disc bulge. Severe facet arthropathy. Prominent epidural fat. Effacement of the lateral recesses and moderate to severe spinal canal stenosis, which are new from the prior exam. Mild bilateral neural foraminal narrowing, which has progressed on the left.   L5-S1: No significant disc bulge. No spinal canal stenosis or neural foraminal narrowing.   IMPRESSION: CERVICAL SPINE:   1. C6-C7 moderate spinal canal stenosis, which has progressed from the prior exam, with unchanged mild bilateral neural foraminal narrowing. 2. C5-C6 mild-to-moderate spinal canal stenosis, which has progressed slightly from the prior exam, with unchanged moderate left and mild-to-moderate right neural foraminal narrowing. 3. C4-C5 mild bilateral neural foraminal narrowing.   THORACIC SPINE:   Overall unchanged degenerative findings in the thoracic spine, without significant spinal canal stenosis.   LUMBAR SPINE:   1. L4-L5 moderate to severe spinal canal stenosis, which is new from the prior exam, with effacement of the lateral recesses, likely compressing the descending L5 nerve roots, and mild bilateral neural foraminal narrowing, which has  progressed on the left. 2. L3-L4 mild spinal canal stenosis, which is new from the prior exam, with mild left-greater-than-right neural foraminal narrowing.     Electronically Signed   By: Donald Campion M.D.   On: 09/20/2023 20:41  PATIENT SURVEYS:  CLINICAL DATA:  Right hip pain and arthralgia.   EXAM: DG HIP (WITH OR WITHOUT PELVIS) 2-3V RIGHT   COMPARISON:  MRI right hip 02/07/2017   FINDINGS: No acute fracture or dislocation. Mild to moderate degenerative arthritis of both hips.  IMPRESSION: Mild to moderate degenerative arthritis of both hips.     Electronically Signed   By: Norman Gatlin M.D.   On: 04/13/2024 23:52  COGNITION: Overall cognitive status: Within functional limits for tasks assessed  SENSATION: WFL  POSTURE: rounded shoulders, forward head, and weight shift left   Strength R/L *5/5 Hip flexion *5/5 Hip external rotation *5/5 Hip internal rotation 5/5 Hip extension  *4+/4+ Hip abduction 5/5 Hip adduction 5/5 Knee extension 5/5 Knee flexion 5/5 Ankle Dorsiflexion 5/5 Ankle Plantarflexion 5/5 Ankle Inversion 5/5 Ankle Eversion *indicates pain    Muscle Length Hamstring length (degrees): R/L: NT Quad length Arvid): NT Hip Flexor length Cathy): R/L: + Right IT band length Everlene): -   CERVICAL ROM:   Active ROM A/PROM (deg) eval  Flexion 18  Extension 32  Right lateral flexion 32  Left lateral flexion 31  Right rotation 72  Left rotation 59   (Blank rows = not tested)   PALPATION: (+) Tenderness just posterior and later to RSIJ, some tenderness in gluteal/piriformis, Gluteal med and TFL and Right hip flexor   FUNCTIONAL TESTS:  5 times sit to stand: 12.44 10 meter walk test: 12.25 sec or 0.82 m/s without AD  GAIT: Distance walked: 100 feet Assistive device utilized: None Level of assistance: Complete Independence Comments: antalgic on Right    TREATMENT DATE: 07/17/2024   Manual therapy:  -Suboccipital  release-30sec x 3 Reports feeling some relief with lower cervical pain.  -Manual c-spine traction (supine) - hold 45 sec x3 -Manual c-spine distraction with side bending- x 20 sec hold x 3 each Side. -STM to B cervical paraspinals and B Upper trap musculature  x 8 min -Long axis distraction R hip inferior glide- hold 30 sec x 3 -Hip ROM- A/P and PA hip mobs x 30 bouts in 90/90 position - STM to R gluteal (SI/piriformis/ GM/TFL/IT band) x 15 min   PATIENT EDUCATION:  Education details: Anatomy of hip; Purpose of PT; Proposed PT plan of care and interventions to be performed; Education in Risk analyst Person educated: Patient Education method: Explanation, Demonstration, Tactile cues, Verbal cues, and Handouts Education comprehension: verbalized understanding, returned demonstration, verbal cues required, tactile cues required, and needs further education  HOME EXERCISE PROGRAM: Access Code: [email or web address] URL: [email or web address] Date: 07/15/2024 Prepared by: Reyes London  Exercises - Seated Scapular Retraction  - 3 x weekly - 3 sets - 10 reps - Seated Cervical Retraction  - 3 x weekly - 3 sets - 10 reps - Seated Assisted Cervical Rotation with Towel  - 1 x daily - 3 sets - 30 sec hold      Access Code: [email or web address] URL: [email or web address] Date: 07/04/2024 Prepared by: Reyes London & Updated Sidra Simpers  Exercises - Supine Hip Internal and External Rotation  - 1 x daily - 3 sets - 10 reps - Supine Lower Trunk Rotation  - 1 x daily - 3 sets - 10 reps - Hip Flexor Stretch at Edge of Bed  - 1 x daily - 3 sets - 30 hold - Supine Bridge  - 1 x daily - 7 x weekly - 3 sets - 10 reps - Clamshell  - 1 x daily - 7 x weekly - 3 sets - 10 reps - Sidelying Reverse Clamshell  - 1 x daily - 7 x weekly - 3 sets - 10 reps  ASSESSMENT:  CLINICAL IMPRESSION:  Treatment focused on pain management today including spending majority of session working on  manual techniques to  assist with pain relief and ROM. Patient responded well to manual therapy for cervical pain and ROM - able to tolerate traction and some joint glides followed up with some STM. Her hip was more tender than her neck overall today but also responded well with report of feeling some relief post session.  Pt will continue to benefit from skilled therapy to address remaining deficits in order to improve overall QoL and return to PLOF.     OBJECTIVE IMPAIRMENTS: Abnormal gait, decreased activity tolerance, decreased balance, decreased endurance, decreased mobility, difficulty walking, decreased ROM, hypomobility, impaired flexibility, postural dysfunction, and pain.   ACTIVITY LIMITATIONS: carrying, lifting, bending, sitting, standing, squatting, sleeping, stairs, transfers, and bed mobility  PARTICIPATION LIMITATIONS: meal prep, cleaning, laundry, driving, shopping, community activity, and yard work  PERSONAL FACTORS: Past/current experiences, Time since onset of injury/illness/exacerbation, and 3+ comorbidities: chronic pain syndrome, Lumbar and cervical spondylosis, anxiety are also affecting patient's functional outcome.   REHAB POTENTIAL: Good  CLINICAL DECISION MAKING: Evolving/moderate complexity  EVALUATION COMPLEXITY: Moderate   GOALS: Goals reviewed with patient? Yes  SHORT TERM GOALS: Target date: 08/15/2024  Pt will be independent with HEP in order to decrease ankle pain and increase strength in order to improve pain-free function at home. Baseline: EVAL- No formal HEP in place Goal status: INITIAL   LONG TERM GOALS: Target date: 09/26/2024  Pt will increase LEFS by at least 9 points in order to demonstrate significant improvement in lower extremity function.  Baseline: EVAL= 36/80 Goal status: INITIAL  2.  Pt will decrease worst pain as reported on NPRS by at least 2 points in order to demonstrate clinically significant reduction in R hip pain.   Baseline: EVAL= 4/10 right hip pain Goal status: INITIAL  3.  Pt will demonstrate improved gait speed > 1.0 m/s normal speed for improved functional mobility in community. Baseline: EVAL =0.82 m/s Goal status: INITIAL  4.  Pt will demonstrate increased active cervical ROM to at least 28 flexion, 42 extension, and 69 left rotation to improve ability to perform ADLs such as driving and head turning with reduced restriction.  Baseline: Flexion: 18; Extension: 32 deg; L Rotation: 59 deg    PLAN:  PT FREQUENCY: 1-2x/week  PT DURATION: 12 weeks  PLANNED INTERVENTIONS: [id number]- PT Re-evaluation, 97750- Physical Performance Testing, 97110-Therapeutic exercises, 97530- Therapeutic activity, [id number]- Neuromuscular re-education, 97535- Self Care, [id number]- Manual therapy, [id number]- Gait training, [id number]- Orthotic Initial, [id number]- Orthotic/Prosthetic subsequent, [phone]- Canalith repositioning, [id number]- Electrical stimulation (manual), [id number]- Traction (mechanical), 20560 (1-2 muscles), 20561 (3+ muscles)- Dry Needling, Patient/Family education, Balance training, Stair training, Taping, Joint mobilization, Joint manipulation, Spinal manipulation, Spinal mobilization, Vestibular training, Cryotherapy, and Moist heat  PLAN FOR NEXT SESSION:   Manual therapy for C-spine and hip Instruct in postural activities  Continue with hip ROM/pain relieving techniques/modalities Progress HEP as appropriate.     Physical Therapist - [facility]  07/17/24, 9:36 PM

## 2024-07-18 ENCOUNTER — Other Ambulatory Visit: Payer: Self-pay | Admitting: Internal Medicine

## 2024-07-18 MED ORDER — COVID-19 MRNA VAC-TRIS(PFIZER) 30 MCG/0.3ML IM SUSY: 0.3000 mL | PREFILLED_SYRINGE | Freq: Once | INTRAMUSCULAR | 0 refills | Status: AC

## 2024-07-21 ENCOUNTER — Encounter

## 2024-07-21 ENCOUNTER — Other Ambulatory Visit: Payer: Self-pay | Admitting: Internal Medicine

## 2024-07-21 MED ORDER — COVID-19 MRNA VAC-TRIS(PFIZER) 30 MCG/0.3ML IM SUSY: 0.3000 mL | PREFILLED_SYRINGE | Freq: Once | INTRAMUSCULAR | 0 refills | Status: AC

## 2024-07-22 ENCOUNTER — Ambulatory Visit

## 2024-07-23 ENCOUNTER — Encounter

## 2024-07-23 NOTE — Therapy (Signed)
 OUTPATIENT PHYSICAL THERAPY CERVICAL and HIP TREATMENT   Patient Name: Nicole Walter MRN: [id number] DOB:Sep 03, 1961, 63 y.o., female Today's Date: 07/24/2024  END OF SESSION:  PT End of Session - 07/24/24 0801     Visit Number 5    Number of Visits 24    Date for PT Re-Evaluation 09/26/24    PT Start Time 0801    PT Stop Time 0846    PT Time Calculation (min) 45 min    Equipment Utilized During Treatment Gait belt    Activity Tolerance Patient tolerated treatment well    Behavior During Therapy [facility] for tasks assessed/performed            Past Medical History:  Diagnosis Date   Abnormal liver enzymes 02/08/2016   Allergy    Anxiety    Chest pain 07/05/2016   Chronic pain    Depression    DJD (degenerative joint disease)    lumbar   Fibromyalgia    High cholesterol    Rash of back 01/28/2016   Weight loss due to medication    Past Surgical History:  Procedure Laterality Date   ANKLE SURGERY     x 2   BREAST BIOPSY Left 1997   CHOLECYSTECTOMY     COLONOSCOPY     COLONOSCOPY WITH PROPOFOL  N/A 02/22/2016   Procedure: COLONOSCOPY WITH PROPOFOL ;  Surgeon: Gladis RAYMOND Mariner, MD;  Location: [facility] ENDOSCOPY;  Service: Endoscopy;  Laterality: N/A;   ESOPHAGOGASTRODUODENOSCOPY (EGD) WITH PROPOFOL  N/A 02/22/2016   Procedure: ESOPHAGOGASTRODUODENOSCOPY (EGD) WITH PROPOFOL ;  Surgeon: Gladis RAYMOND Mariner, MD;  Location: [facility] ENDOSCOPY;  Service: Endoscopy;  Laterality: N/A;   NASAL SINUS SURGERY     SHOULDER ARTHROSCOPY WITH ROTATOR CUFF REPAIR AND SUBACROMIAL DECOMPRESSION Left 09/23/2019   Procedure: SHOULDER ARTHROSCOPY WITH MINI ROTATOR CUFF REPAIR AND SUBACROMIAL DECOMPRESSION, BICEPS TENODESIS;  Surgeon: Tobie Priest, MD;  Location: [facility];  Service: Orthopedics;  Laterality: Left;   TONSILLECTOMY     TUBAL LIGATION     Patient Active Problem List   Diagnosis Date Noted   Dyspepsia 10/15/2023   Hot flash, menopausal 09/03/2023   Chronic bronchitis (HCC)  09/03/2023   Piriformis syndrome of right side 08/20/2023   Oral thrush 05/15/2023   Acute maxillary sinusitis 05/15/2023   Chronic radicular lumbar pain 03/20/2023   Exposure to sexually transmitted disease (STD) 01/25/2023   Acute vaginitis 01/25/2023   Abnormal cervical Papanicolaou smear 01/25/2023   Abnormal MRI, cervical spine (09/04/2017) 07/22/2019   Neurogenic pain 07/22/2019   DDD (degenerative disc disease), cervical 06/09/2019   Chronic musculoskeletal pain 05/14/2019   Trypanophobia (Needle phobia) 09/26/2018   Spondylosis without myelopathy or radiculopathy, lumbar region 08/29/2018   Lumbar facet hypertrophy 08/29/2018   Lumbar facet syndrome (Right) 08/29/2018   DDD (degenerative disc disease), thoracic 08/29/2018   Thoracic spondylosis with radiculopathy (Right) 08/29/2018   Spinal enthesopathy of lumbar region (HCC) 08/29/2018   Chronic midline low back pain without sciatica 08/29/2018   Generalized anxiety disorder 07/23/2018   Cervical facet hypertrophy (Bilateral) 07/18/2018   Cervical central spinal stenosis 07/18/2018   Chronic shoulder pain (Left) 07/09/2018   Cervicalgia (Bilateral) (L>R) 03/28/2018   Spondylosis without myelopathy or radiculopathy, cervical region 02/11/2018   Chronic pelvic pain in female 08/24/2017   Fatty liver 08/24/2017   Chronic low back pain (Bilateral) (L>R) 08/13/2017   Cervical Foraminal Stenosis (Severe) (Bilateral: C5-6) 08/13/2017   Cervical spondylosis with radiculopathy (Bilateral) (L>R) 08/13/2017   Cervical sensory radiculopathy at C5 (Bilateral) 08/13/2017  Cervical radicular pain 08/13/2017   Constipation 07/16/2017   Myofascial pain syndrome, cervical (trapezius) (Left) 05/28/2017   Cervical facet syndrome (Bilateral) (L>R) 05/28/2017   Muscle spasm 05/15/2017   Chronic hip pain (Right) 03/19/2017   Chronic knee arthropathy (Right) 03/14/2017   Osteoarthritis of knee (Right) 02/21/2017   Osteoarthritis of shoulder  (Right) 01/31/2017   Shortness of breath 01/15/2017   Thoracic spondylosis 01/01/2017   Radicular pain of thoracic region 12/21/2016   Osteoarthritis of hip (Bilateral) 11/20/2016   Greater trochanteric bursitis (Right) 09/18/2016   Subacromial bursitis of shoulder joint (Right) 09/18/2016   Overweight (BMI 25.0-29.9) 09/14/2016   Elevated sedimentation rate 08/31/2016   Elevated C-reactive protein (CRP) 08/31/2016   Long term current use of opiate analgesic 07/20/2016   Long term prescription opiate use 07/20/2016   Opiate use 07/20/2016   Chronic upper back pain (Secondary Area of Pain) (Bilateral) (L>R) 07/20/2016   Chronic shoulder pain (Tertiary Area of Pain) (Bilateral) (L>R) 07/20/2016   Chronic knee pain (Right) 07/20/2016   Chronic upper extremity pain (Left) 07/20/2016   Chronic lower extremity pain (Bilateral) (L>R) 07/20/2016   Chronic abdominal pain 07/20/2016   Chronic hip pain (Bilateral) (R>L) 05/31/2016   Chronic Greater trochanteric bursitis (Bilateral) (L>R) 05/31/2016   Bursitis of shoulder (Right) 05/17/2016   Skin lesions 04/27/2016   Hyperlipidemia 02/18/2016   Cervical spondylosis (Bilateral) 02/08/2016   Fibromyalgia 02/08/2016   Osteopenia 02/08/2016   Chronic neck pain (Primary Area of Pain) (Bilateral) (L>R) 01/28/2016   Concentration deficit 01/28/2016   Special screening for malignant neoplasms, vagina 01/14/2016   Lumbar spondylosis 01/06/2016   Anxiety and depression 07/11/2015   Chronic pain syndrome 07/11/2015   Combined fat and carbohydrate induced hyperlipemia 07/11/2015    PCP: Dr. GORMAN Cinderella Perry  REFERRING PROVIDER: Dr. Wallie Sherry  REFERRING DIAG:  M54.12 (ICD-10-CM) - Cervical radicular pain  M54.2 (ICD-10-CM) - Cervicalgia  M16.11 (ICD-10-CM) - Primary osteoarthritis of right hip    THERAPY DIAG:  Abnormality of gait and mobility  Difficulty in walking, not elsewhere classified  Muscle weakness (generalized)  Other  abnormalities of gait and mobility  Other lack of coordination  Pain in right hip  Cervicalgia  Other low back pain  Rationale for Evaluation and Treatment: Rehabilitation  ONSET DATE: 1990's per patient  SUBJECTIVE:                                                                                                                                                                                                         SUBJECTIVE STATEMENT: Patient  reports increased stiffness from clearing and rainy weather yesterday. Reports pain still in posterior neck  into upper traps and shoulder blades and then my right hip. States she has been compliant Pt reports pain between the shoulder blades this morning and feeling tight in the hips   Hand dominance: Right  PERTINENT HISTORY:  Patient has cervical and radicular pain into shoulders as well as hip pain. Has received injections with minimal relief. Was referred by Dr. Marcelino and dx of OA in hip and cervical radiculopathy with recent bout of diverticulitis.  PAIN:  Are you having pain? Yes: NPRS scale: 3-4/10 and down to 1-2/10 with oxycodone - 2x/day Pain location: R posterior/lateral Hip pain Pain description: achy, sharp/crampy at time/burning dull at times Aggravating factors: Prolonged walking, prolonged sitting, squatting Relieving factors: pain meds, ice  PRECAUTIONS: Fall  RED FLAGS: None     WEIGHT BEARING RESTRICTIONS: No  FALLS:  Has patient fallen in last 6 months? No  LIVING ENVIRONMENT: Lives with: lives alone Lives in: House/apartment Stairs: Yes: External: 4 steps; on right going up Has following equipment at home: None  OCCUPATION: Not currently-   PLOF: Independent  PATIENT GOALS: Being able to walk better without pain and sleep better.   NEXT MD VISIT: PCP 07/17/2024?  OBJECTIVE:  Note: Objective measures were completed at Evaluation unless otherwise noted.  DIAGNOSTIC FINDINGS:  CLINICAL DATA:  Back    EXAM: MRI CERVICAL, THORACIC AND LUMBAR SPINE WITHOUT CONTRAST   TECHNIQUE: Multiplanar and multiecho pulse sequences of the cervical spine, to include the craniocervical junction and cervicothoracic junction, and thoracic and lumbar spine, were obtained without intravenous contrast.   COMPARISON:  09/04/2017 MRI cervical spine, 07/11/2016 MRI thoracic spine and 03/02/2016 MRI lumbar spine   FINDINGS: MRI CERVICAL SPINE FINDINGS   Alignment: Straightening and mild reversal of the normal cervical lordosis. Trace anterolisthesis of C2 on C3, C3 on C4, and C4 on C5, which is new from the prior exam and favored to be facet mediated.   Vertebrae: No acute fracture, evidence of discitis, or suspicious osseous lesion.   Cord: Normal signal and morphology.   Posterior Fossa, vertebral arteries, paraspinal tissues: Negative.   Disc levels:   C2-C3: No significant disc bulge. Left facet arthropathy. No spinal canal stenosis or neural foraminal narrowing.   C3-C4: Previously noted right paracentral disc protrusion is no longer seen. No significant disc bulge. Left-greater-than-right facet arthropathy. No spinal canal stenosis or neural foraminal narrowing.   C4-C5: Small central disc protrusion. Facet arthropathy. No spinal canal stenosis. Mild bilateral neural foraminal narrowing.   C5-C6: Mild disc bulge. Ligamentum flavum hypertrophy. Mild facet arthropathy. Mild-to-moderate spinal canal stenosis, which has progressed slightly from the prior exam. Moderate left and mild-to-moderate right neural foraminal narrowing, unchanged.   C6-C7: Mild disc bulge with increased size left paracentral disc extrusion, which indents the left aspect of the thecal sac and ventral spinal cord. Ligamentum flavum hypertrophy. Facet arthropathy. Moderate spinal canal stenosis, which has progressed from the prior exam, with unchanged mild bilateral neural foraminal narrowing.   C7-T1: No  significant disc bulge. Left-greater-than-right facet arthropathy. No spinal canal stenosis. Mild left neural foraminal narrowing.   MRI THORACIC SPINE FINDINGS   Alignment: No listhesis. In mild levocurvature of the upper thoracic spine with dextrocurvature of the lower thoracic spine and thoracolumbar junction. Straightening of the normal thoracic kyphosis.   Vertebrae: No acute fracture, evidence of discitis, or suspicious osseous lesion.   Cord:  Normal signal and morphology.  Paraspinal and other soft tissues: Negative.   Disc levels:   T1-T2: Facet arthropathy. No spinal canal stenosis. Moderate to severe right and moderate left neural foraminal narrowing, unchanged.   T2-T3: Right eccentric disc bulge. Right greater than left facet arthropathy. No spinal canal stenosis. Severe right neural foraminal narrowing, unchanged.   T3-T4: Right eccentric disc bulge. Right greater than facet arthropathy. No spinal canal stenosis. Moderate right neural foraminal narrowing.   T4-T5: Facet arthropathy. No spinal canal stenosis or neural foraminal narrowing.   T5-T6: Left paracentral disc protrusion. No spinal canal stenosis or neural foraminal narrowing.   T6-T7: Small central disc protrusion. No spinal canal stenosis or neural foraminal narrowing.   T7-T8: Small right subarticular disc protrusion. No spinal canal stenosis or neural foraminal narrowing.   T8-T9: Facet arthropathy. No spinal canal stenosis or neural foraminal narrowing.   T9-T10: Moderate facet arthropathy. No spinal canal stenosis. Mild bilateral neural foraminal narrowing.   T10-T11: Moderate to severe facet arthropathy. No spinal canal stenosis. Mild bilateral neural foraminal narrowing.   T11-T12: Moderate facet arthropathy. No spinal canal stenosis. Mild left neural foraminal narrowing.   MRI LUMBAR SPINE FINDINGS   Segmentation:  5 lumbar type vertebral bodies.   Alignment: 5 mm  anterolisthesis L4 on L5, which is new from the prior exam. Mild levocurvature.   Vertebrae: No acute fracture, evidence of discitis, or suspicious osseous lesion.   Conus medullaris and cauda equina: Conus extends to the L1 level. Conus and cauda equina appear normal.   Paraspinal and other soft tissues: Small renal cysts, for which no follow-up is currently indicated. No lymphadenopathy.   Disc levels:   T12-L1: No significant disc bulge. No spinal canal stenosis or neural foraminal narrowing.   L1-L2: No significant disc bulge. No spinal canal stenosis or neural foraminal narrowing.   L2-L3: Minimal disc bulge. No spinal canal stenosis or neural foraminal narrowing.   L3-L4: Mild disc bulge. Mild facet arthropathy. Narrowing of the lateral recesses. Mild spinal canal stenosis, which is new from the prior exam. Mild left-greater-than-right neural foraminal narrowing, similar to the prior.   L4-L5: Grade 1 anterolisthesis with disc unroofing and moderate disc bulge. Severe facet arthropathy. Prominent epidural fat. Effacement of the lateral recesses and moderate to severe spinal canal stenosis, which are new from the prior exam. Mild bilateral neural foraminal narrowing, which has progressed on the left.   L5-S1: No significant disc bulge. No spinal canal stenosis or neural foraminal narrowing.   IMPRESSION: CERVICAL SPINE:   1. C6-C7 moderate spinal canal stenosis, which has progressed from the prior exam, with unchanged mild bilateral neural foraminal narrowing. 2. C5-C6 mild-to-moderate spinal canal stenosis, which has progressed slightly from the prior exam, with unchanged moderate left and mild-to-moderate right neural foraminal narrowing. 3. C4-C5 mild bilateral neural foraminal narrowing.   THORACIC SPINE:   Overall unchanged degenerative findings in the thoracic spine, without significant spinal canal stenosis.   LUMBAR SPINE:   1. L4-L5 moderate to  severe spinal canal stenosis, which is new from the prior exam, with effacement of the lateral recesses, likely compressing the descending L5 nerve roots, and mild bilateral neural foraminal narrowing, which has progressed on the left. 2. L3-L4 mild spinal canal stenosis, which is new from the prior exam, with mild left-greater-than-right neural foraminal narrowing.     Electronically Signed   By: Donald Campion M.D.   On: 09/20/2023 20:41  PATIENT SURVEYS:  CLINICAL DATA:  Right hip pain and arthralgia.   EXAM:  DG HIP (WITH OR WITHOUT PELVIS) 2-3V RIGHT   COMPARISON:  MRI right hip 02/07/2017   FINDINGS: No acute fracture or dislocation. Mild to moderate degenerative arthritis of both hips.   IMPRESSION: Mild to moderate degenerative arthritis of both hips.     Electronically Signed   By: Norman Gatlin M.D.   On: 04/13/2024 23:52  COGNITION: Overall cognitive status: Within functional limits for tasks assessed  SENSATION: WFL  POSTURE: rounded shoulders, forward head, and weight shift left   Strength R/L *5/5 Hip flexion *5/5 Hip external rotation *5/5 Hip internal rotation 5/5 Hip extension  *4+/4+ Hip abduction 5/5 Hip adduction 5/5 Knee extension 5/5 Knee flexion 5/5 Ankle Dorsiflexion 5/5 Ankle Plantarflexion 5/5 Ankle Inversion 5/5 Ankle Eversion *indicates pain    Muscle Length Hamstring length (degrees): R/L: NT Quad length Arvid): NT Hip Flexor length Cathy): R/L: + Right IT band length Everlene): -   CERVICAL ROM:   Active ROM A/PROM (deg) eval  Flexion 18  Extension 32  Right lateral flexion 32  Left lateral flexion 31  Right rotation 72  Left rotation 59   (Blank rows = not tested)   PALPATION: (+) Tenderness just posterior and later to RSIJ, some tenderness in gluteal/piriformis, Gluteal med and TFL and Right hip flexor   FUNCTIONAL TESTS:  5 times sit to stand: 12.44 10 meter walk test: 12.25 sec or 0.82 m/s without  AD  GAIT: Distance walked: 100 feet Assistive device utilized: None Level of assistance: Complete Independence Comments: antalgic on Right    TREATMENT DATE: 07/24/2024  Self Care/Home management:  Instruction in standing hip flexor stretch- hold 30 sec x 3 ea LE- Patient reported feeling an appropriate stretch.  Instructed in postural activities to promote improved overall seated/standing posture and pain relief: -Standing rows (elbows in) 2 x 10 with RTB -Standing shoulder ext (RTB) 2 x 10 reps -Standing shoulder horizontal ABD- RTB 2 x 10  *Issued latex free theraband and Issued HEP  Manual therapy:    -Manual c-spine traction (supine) - hold 45 sec x3 -Manual c-spine distraction with side bending- x 20 sec hold x 3 each Side. -STM to B cervical paraspinals and B Upper trap musculature  x 10 min -Hip ROM-  PA hip mobs x 30 bouts in Prone position -Thoracic PA mobs T3-T8 region x 30 bouts each - STM to  L and R gluteal (SI/piriformis/ GM/TFL/IT band) x   PATIENT EDUCATION:  Education details: Advanced HEP; Discussed role of muscle in posture Person educated: Patient Education method: Explanation, Demonstration, Tactile cues, Verbal cues, and Handouts Education comprehension: verbalized understanding, returned demonstration, verbal cues required, tactile cues required, and needs further education  HOME EXERCISE PROGRAM:  Access Code: [email or web address] URL: [email or web address] Date: 07/24/2024 Prepared by: Reyes London  Exercises - Standing Shoulder Row with Anchored Resistance  - 3 x weekly - 3 sets - 10 reps - Shoulder extension with resistance - Neutral  - 3 x weekly - 3 sets - 10 reps - Standing Shoulder Horizontal Abduction with Resistance  - 3 x weekly - 3 sets - 10 reps     Access Code: [email or web address] URL: [email or web address] Date: 07/15/2024 Prepared by: Reyes London  Exercises - Seated Scapular Retraction  - 3 x weekly - 3  sets - 10 reps - Seated Cervical Retraction  - 3 x weekly - 3 sets - 10 reps - Seated Assisted Cervical Rotation with Towel  - 1 x daily - 3 sets - 30 sec hold  Access Code: [email or web address] URL: [email or web address] Date: 07/04/2024 Prepared by: Reyes London & Updated Sidra Simpers  Exercises - Supine Hip Internal and External Rotation  - 1 x daily - 3 sets - 10 reps - Supine Lower Trunk Rotation  - 1 x daily - 3 sets - 10 reps - Hip Flexor Stretch at Edge of Bed  - 1 x daily - 3 sets - 30 hold - Supine Bridge  - 1 x daily - 7 x weekly - 3 sets - 10 reps - Clamshell  - 1 x daily - 7 x weekly - 3 sets - 10 reps - Sidelying Reverse Clamshell  - 1 x daily - 7 x weekly - 3 sets - 10 reps  ASSESSMENT:  CLINICAL IMPRESSION:  Patient reported more overall stiffness and soreness today and treatment focused on pain modulation and relief with more STM to cervical/thoracic and B hip regions. Patient responded well overall with reported relief and responded well to postural education and activities- requiring some verbal and visual cues but able to adapt quickly to task and perform without increased pain. Pt will continue to benefit from skilled therapy to address remaining deficits in order to improve overall QoL and return to PLOF.     OBJECTIVE IMPAIRMENTS: Abnormal gait, decreased activity tolerance, decreased balance, decreased endurance, decreased mobility, difficulty walking, decreased ROM, hypomobility, impaired flexibility, postural dysfunction, and pain.   ACTIVITY LIMITATIONS: carrying, lifting, bending, sitting, standing, squatting, sleeping, stairs, transfers, and bed mobility  PARTICIPATION LIMITATIONS: meal prep, cleaning, laundry, driving, shopping, community activity, and yard work  PERSONAL FACTORS: Past/current experiences, Time since onset of injury/illness/exacerbation, and 3+ comorbidities: chronic pain syndrome, Lumbar and cervical spondylosis, anxiety  are also affecting patient's functional outcome.   REHAB POTENTIAL: Good  CLINICAL DECISION MAKING: Evolving/moderate complexity  EVALUATION COMPLEXITY: Moderate   GOALS: Goals reviewed with patient? Yes  SHORT TERM GOALS: Target date: 08/15/2024  Pt will be independent with HEP in order to decrease ankle pain and increase strength in order to improve pain-free function at home. Baseline: EVAL- No formal HEP in place Goal status: INITIAL   LONG TERM GOALS: Target date: 09/26/2024  Pt will increase LEFS by at least 9 points in order to demonstrate significant improvement in lower extremity function.  Baseline: EVAL= 36/80 Goal status: INITIAL  2.  Pt will decrease worst pain as reported on NPRS by at least 2 points in order to demonstrate clinically significant reduction in R hip pain.  Baseline: EVAL= 4/10 right hip pain Goal status: INITIAL  3.  Pt will demonstrate improved gait speed > 1.0 m/s normal speed for improved functional mobility in community. Baseline: EVAL =0.82 m/s Goal status: INITIAL  4.  Pt will demonstrate increased active cervical ROM to at least 28 flexion, 42 extension, and 69 left rotation to improve ability to perform ADLs such as driving and head turning with reduced restriction.  Baseline: Flexion: 18; Extension: 32 deg; L Rotation: 59 deg    PLAN:  PT FREQUENCY: 1-2x/week  PT DURATION: 12 weeks  PLANNED INTERVENTIONS: 97164- PT Re-evaluation, 97750- Physical Performance Testing, 97110-Therapeutic exercises, 97530- Therapeutic activity, [id number]- Neuromuscular re-education, 97535- Self Care, 02859- Manual therapy, [id number]- Gait training, [id number]- Orthotic Initial, [id number]- Orthotic/Prosthetic subsequent, [id number]- Canalith repositioning, [id number]- Electrical stimulation (manual), [id number]- Traction (mechanical), 20560 (1-2 muscles), 20561 (3+ muscles)- Dry Needling, Patient/Family education, Balance training, Stair training, Taping, Joint mobilization, Joint  manipulation, Spinal manipulation, Spinal mobilization, Vestibular training, Cryotherapy, and Moist heat  PLAN FOR NEXT SESSION:  Manual therapy for C-spine and hip Instruct in postural activities  Continue with hip ROM/pain relieving techniques/modalities Progress HEP as appropriate.    Chyrl London, PT  Physical Therapist - [facility]  07/24/24, 9:33 AM

## 2024-07-24 ENCOUNTER — Ambulatory Visit

## 2024-07-24 DIAGNOSIS — R269 Unspecified abnormalities of gait and mobility: Secondary | ICD-10-CM | POA: Diagnosis not present

## 2024-07-24 DIAGNOSIS — M25551 Pain in right hip: Secondary | ICD-10-CM

## 2024-07-24 DIAGNOSIS — M5459 Other low back pain: Secondary | ICD-10-CM

## 2024-07-24 DIAGNOSIS — R262 Difficulty in walking, not elsewhere classified: Secondary | ICD-10-CM

## 2024-07-24 DIAGNOSIS — M6281 Muscle weakness (generalized): Secondary | ICD-10-CM

## 2024-07-24 DIAGNOSIS — R278 Other lack of coordination: Secondary | ICD-10-CM

## 2024-07-24 DIAGNOSIS — M542 Cervicalgia: Secondary | ICD-10-CM

## 2024-07-24 DIAGNOSIS — R2689 Other abnormalities of gait and mobility: Secondary | ICD-10-CM

## 2024-07-25 NOTE — Progress Notes (Signed)
 GYNECOLOGY PROGRESS NOTE  Subjective:  PCP: Albina GORMAN Dine, MD  Patient ID: Nicole Walter, female    DOB: December 28, 1960, 63 y.o.   MRN: [id number]  HPI  Patient is a 63 y.o. G83P2002 female who presents for postmenopausal vaginal bleeding, referred by Dr. Albina.  TVUS done 06/23/24.  Patient has been menopausal x 10 years.  She noted some spotting in May after doing some heavy lifting.  The spotting comes and goes and she mostly sees it on her toilet paper. Happens about weekly. Wears daily liner and never sees the spotting on the liner, but when had TVUS on 06/23/24, there was brown blood on the transducer afterward. US  showed EMT of 2mm.   LMP: 2015 Last pap: 01/25/23, NILM, HPV neg Abnormal pap: yes over 65yrs ago Last mammogram: 01/2023, normal  OB History  Gravida Para Term Preterm AB Living  2 2 2   2   SAB IAB Ectopic Multiple Live Births      2    # Outcome Date GA Lbr Len/2nd Weight Sex Type Anes PTL Lv  2 Term 1987   8 lb 15 oz (4.054 kg) F Vag-Vacuum   LIV  1 Term 1984   7 lb (3.175 kg) M Vag-Forceps  N LIV   Past Medical History:  Diagnosis Date   Abnormal liver enzymes 02/08/2016   Allergy    Anxiety    Chest pain 07/05/2016   Chronic pain    Depression    DJD (degenerative joint disease)    lumbar   Fibromyalgia    High cholesterol    Rash of back 01/28/2016   Weight loss due to medication    Patient Active Problem List   Diagnosis Date Noted   Dyspepsia 10/15/2023   Hot flash, menopausal 09/03/2023   Chronic bronchitis (HCC) 09/03/2023   Piriformis syndrome of right side 08/20/2023   Oral thrush 05/15/2023   Acute maxillary sinusitis 05/15/2023   Chronic radicular lumbar pain 03/20/2023   Exposure to sexually transmitted disease (STD) 01/25/2023   Acute vaginitis 01/25/2023   Abnormal cervical Papanicolaou smear 01/25/2023   Abnormal MRI, cervical spine 07/22/2019   Neurogenic pain 07/22/2019   DDD (degenerative disc disease), cervical 06/09/2019    Chronic musculoskeletal pain 05/14/2019   Needle phobia 09/26/2018   Spondylosis without myelopathy or radiculopathy, lumbar region 08/29/2018   Lumbar facet hypertrophy 08/29/2018   Lumbar facet syndrome (Right) 08/29/2018   DDD (degenerative disc disease), thoracic 08/29/2018   Thoracic spondylosis with radiculopathy (Right) 08/29/2018   Spinal enthesopathy of lumbar region 08/29/2018   Chronic midline low back pain without sciatica 08/29/2018   Generalized anxiety disorder 07/23/2018   Cervical facet hypertrophy (Bilateral) 07/18/2018   Cervical central spinal stenosis 07/18/2018   Chronic shoulder pain (Left) 07/09/2018   Cervicalgia (Bilateral) (L>R) 03/28/2018   Spondylosis without myelopathy or radiculopathy, cervical region 02/11/2018   Chronic pelvic pain in female 08/24/2017   Fatty liver 08/24/2017   Chronic low back pain (Bilateral) (L>R) 08/13/2017   Foraminal stenosis of cervical region 08/13/2017   Chronic low back pain 08/13/2017   Cervical sensory radiculopathy at C5 (Bilateral) 08/13/2017   Cervical radicular pain 08/13/2017   Constipation 07/16/2017   Myofascial pain syndrome, cervical (trapezius) (Left) 05/28/2017   Cervical facet syndrome (Bilateral) (L>R) 05/28/2017   Muscle spasm 05/15/2017   Chronic hip pain (Right) 03/19/2017   Chronic knee arthropathy (Right) 03/14/2017   Osteoarthritis of knee (Right) 02/21/2017   Osteoarthritis of shoulder (Right) 01/31/2017  Shortness of breath 01/15/2017   Thoracic spondylosis 01/01/2017   Radicular pain of thoracic region 12/21/2016   Osteoarthritis of hip (Bilateral) 11/20/2016   Greater trochanteric bursitis (Right) 09/18/2016   Subacromial bursitis of shoulder joint (Right) 09/18/2016   Overweight (BMI 25.0-29.9) 09/14/2016   ESR raised 08/31/2016   Elevated C-reactive protein (CRP) 08/31/2016   Long term (current) use of opiate analgesic 07/20/2016   Long term prescription opiate use 07/20/2016   Opiate use  07/20/2016   Chronic upper back pain (Secondary Area of Pain) (Bilateral) (L>R) 07/20/2016   Chronic shoulder pain (Tertiary Area of Pain) (Bilateral) (L>R) 07/20/2016   Chronic knee pain (Right) 07/20/2016   Chronic upper extremity pain (Left) 07/20/2016   Chronic lower extremity pain (Bilateral) (L>R) 07/20/2016   Chronic abdominal pain 07/20/2016   Chronic hip pain (Bilateral) (R>L) 05/31/2016   Chronic Greater trochanteric bursitis (Bilateral) (L>R) 05/31/2016   Bursitis of shoulder (Right) 05/17/2016   Skin lesions 04/27/2016   Hyperlipidemia 02/18/2016   Cervical spondylosis (Bilateral) 02/08/2016   Fibromyalgia 02/08/2016   Osteopenia 02/08/2016   Chronic neck pain (Primary Area of Pain) (Bilateral) (L>R) 01/28/2016   Concentration deficit 01/28/2016   Routine history and physical examination of adult 01/14/2016   Lumbar spondylosis 01/06/2016   Anxiety and depression 07/11/2015   Chronic pain syndrome 07/11/2015   Combined fat and carbohydrate induced hyperlipemia 07/11/2015   Past Surgical History:  Procedure Laterality Date   ANKLE SURGERY     x 2   BREAST BIOPSY Left 1997   CHOLECYSTECTOMY     COLONOSCOPY     COLONOSCOPY WITH PROPOFOL  N/A 02/22/2016   Procedure: COLONOSCOPY WITH PROPOFOL ;  Surgeon: Gladis RAYMOND Mariner, MD;  Location[facility] ENDOSCOPY;  Service: Endoscopy;  Laterality: N/A;   ESOPHAGOGASTRODUODENOSCOPY (EGD) WITH PROPOFOL  N/A 02/22/2016   Procedure: ESOPHAGOGASTRODUODENOSCOPY (EGD) WITH PROPOFOL ;  Surgeon: Gladis RAYMOND Mariner, MD;  Location[facility] ENDOSCOPY;  Service: Endoscopy;  Laterality: N/A;   NASAL SINUS SURGERY     SHOULDER ARTHROSCOPY WITH ROTATOR CUFF REPAIR AND SUBACROMIAL DECOMPRESSION Left 09/23/2019   Procedure: SHOULDER ARTHROSCOPY WITH MINI ROTATOR CUFF REPAIR AND SUBACROMIAL DECOMPRESSION, BICEPS TENODESIS;  Surgeon: Tobie Priest, MD;  Location: [facility];  Service: Orthopedics;  Laterality: Left;   TONSILLECTOMY     TUBAL LIGATION      Family History  Problem Relation Age of Onset   Heart disease Mother    Stroke Mother    Cancer Father        lung   Arthritis Father    Arthritis Paternal Grandmother    Arthritis Paternal Grandfather    Drug abuse Sister    Drug abuse Brother    Post-traumatic stress disorder Brother    Diabetes Sister    Dementia Sister    Drug abuse Brother    Cancer Brother    Social History   Socioeconomic History   Marital status: Legally Separated    Spouse name: Not on file   Number of children: Not on file   Years of education: Not on file   Highest education level: Not on file  Occupational History   Not on file  Tobacco Use   Smoking status: Former    Current packs/day: 0.00    Average packs/day: 1 pack/day for 41.3 years (41.3 ttl pk-yrs)    Types: E-cigarettes, Cigarettes    Start date: 03/06/1974    Quit date: 07/08/2015    Years since quitting: 9.0   Smokeless tobacco: Never   Tobacco comments:  vapor cigarettes, no nicotene  Vaping Use   Vaping status: Every Day   Start date: 07/08/2015   Substances: Nicotine , Flavoring   Devices: [employer]  Substance and Sexual Activity   Alcohol use: No    Alcohol/week: 0.0 standard drinks of alcohol   Drug use: No   Sexual activity: Not Currently    Birth control/protection: Post-menopausal  Other Topics Concern   Not on file  Social History Narrative   Not on file   Social Drivers of Health   Financial Resource Strain: Medium Risk (06/03/2024)   Received from [facility]   Overall Financial Resource Strain (CARDIA)    Difficulty of Paying Living Expenses: Somewhat hard  Food Insecurity: Food Insecurity Present (06/03/2024)   Received from [facility]   Hunger Vital Sign    Within the past 12 months, you worried that your food would run out before you got the money to buy more.: Often true    Within the past 12 months, the food you bought just didn't last and you didn't have money  to get more.: Often true  Transportation Needs: No Transportation Needs (06/03/2024)   Received from [facility] - Transportation    In the past 12 months, has lack of transportation kept you from medical appointments or from getting medications?: No    Lack of Transportation (Non-Medical): No  Physical Activity: Not on file  Stress: Not on file  Social Connections: Not on file  Intimate Partner Violence: Not on file   Current Outpatient Medications on File Prior to Visit  Medication Sig Dispense Refill   Azelastine  HCl 137 MCG/SPRAY SOLN Place 1 Inhalation into the nose daily. 30 mL 2   Calcium Carb-Cholecalciferol (CALCIUM 600 + D) 600-5 MG-MCG TABS Take by mouth.     cetirizine  (ZYRTEC ) 10 MG tablet Take 1 tablet (10 mg total) by mouth every morning. 30 tablet 5   ezetimibe  (ZETIA ) 10 MG tablet Take 1 tablet (10 mg total) by mouth daily. 30 tablet 11   ibuprofen  (ADVIL ) 800 MG tablet TAKE 1 TABLET BY MOUTH EVERY 8 HOURS AS NEEDED 90 tablet 0   LINZESS  72 MCG capsule TK 1 C PO QD 30 capsule 3   LORazepam  (ATIVAN ) 1 MG tablet Take 1 tablet (1 mg total) by mouth every 8 (eight) hours as needed. for anxiety 30 tablet 1   Multiple Vitamin (MULTI-VITAMINS) TABS Take 1 tablet by mouth daily.     omeprazole (PRILOSEC) 40 MG capsule Take 40 mg by mouth daily.     ondansetron  (ZOFRAN -ODT) 4 MG disintegrating tablet Take 1 tablet (4 mg total) by mouth every 8 (eight) hours as needed for nausea or vomiting. 15 tablet 0   oxyCODONE  (OXY IR/ROXICODONE ) 5 MG immediate release tablet Take 1 tablet (5 mg total) by mouth 2 (two) times daily as needed for severe pain (pain score 7-10). Prescribed by Albina GORMAN Dine, MD 60 tablet 0   Vilazodone HCl (VIIBRYD) 10 MG TABS Take 10 mg by mouth daily.     fenofibrate  160 MG tablet Take 1 tablet (160 mg total) by mouth daily. (Patient not taking: Reported on 07/29/2024) 90 tablet 0   nicotine  (NICODERM CQ  - DOSED IN MG/24 HOURS) 14  mg/24hr patch Place 1 patch (14 mg total) onto the skin daily. (Patient not taking: Reported on 06/16/2024) 30 patch 0   No current facility-administered medications on file prior to visit.   Allergies  Allergen  Reactions   Other Hives and Other (See Comments)    Pt states she was tested for allergies and peas was one that she is allergic to but has never ate enough of them to see a reaction. Pt states when she is around bird feathers she gets the hives FEATHERS   Adhesive [Tape] Hives   Food     PEAS/ patient does not know what type reaction she has/ from allergy test during childhood   Statins Other (See Comments)    Severe myalgia   Pea Other (See Comments)    Other reaction(s): Unknown.  Pt states she was tested for allergies and peas was one that she is allergic to but has never ate enough of them to see a reaction.    Review of Systems Pertinent items are noted in HPI.   Objective:   Blood pressure 112/68, pulse 68, height 5' 4 (1.626 m), weight 147 lb 1.6 oz (66.7 kg). Body mass index is 25.25 kg/m.  General appearance: alert, cooperative, and no distress Abdomen: soft, non-tender; bowel sounds normal; no masses,  no organomegaly Pelvic: cervix normal in appearance, external genitalia normal, no adnexal masses or tenderness, no cervical motion tenderness, rectovaginal septum normal, uterus normal size, shape, and consistency, and vagina normal without discharge Extremities: extremities normal, atraumatic, no cyanosis or edema Neurologic: Grossly normal  Endometrial Biopsy Procedure Note  The patient was positioned on the exam table in the dorsal lithotomy position. Bimanual exam confirmed uterine position and size. A speculum was placed into the vagina. The pipette was placed into the endocervical canal and advanced to the uterine fundus. Using a piston like technique, with vacuum created by withdrawing the stylus, the endometrial specimen was obtained and transferred to the  biopsy container. Minimal bleeding encountered. The procedure was well tolerated.   Uterine Position: mid anterior posterior   Uterine Length: 7cm   Uterine Specimen: Scant   Ultrasound 06/24/24 CLINICAL DATA:  Vaginal bleeding FINDINGS: Uterus   Measurements: 5.7 x 3.0 x 3.6 cm = volume: 32 mL. No fibroids or other mass visualized.   Endometrium Thickness: 2 mm. Trace fluid within the endometrial cavity, nonspecific. Similar pattern of subcentimeter echogenic shadowing foci along the posterior endometrium/myometrial interface compared to 2018 favored to be scattered benign calcifications as before.   Right ovary Measurements: 1.1 x 0.6 x 1.1 cm = volume: 0.4 mL. Normal appearance/no adnexal mass.   Left ovary Not visualized   Other findings No abnormal free fluid.   IMPRESSION: 1. Trace fluid within the endometrial cavity, nonspecific. 2. Normal endometrial thickness. 3. Nonvisualization of the left ovary. 4. No other acute finding by pelvic ultrasound.  Assessment/Plan:   1. Postmenopausal bleeding    63 y.o. [id number] with first episode of PMB that began May '25, EMT on US  06/23/24 2mm. EMB done today. Discussed etiologies of postmenopausal bleeding, concern about precancerous/hyperplasia or cancerous etiology (5 to 10% percent of cases). Also discussed role of unopposed estrogen exposure in leading to thickened or proliferative endometrium; and its possible correlation with endometrial hyperplasia/carcinoma.  Discussed that obesity is linked to endometrial pathology given that adipose cells produce extra estrogen (estrone) which can cause the endometrium to have a significant amount of estrogen exposure.  However, she was reassured that endometrial atrophy and endometrial polyps are the most common causes of postmenopausal bleeding.  Uterine bleeding in postmenopausal women is usually light and self-limited. Exclusion of cancer is the main objective; therefore, treatment is  usually unnecessary once cancer has been  excluded.  The primary goal in the diagnostic evaluation of postmenopausal women with uterine bleeding is to exclude malignancy; this can include endometrial biopsy and pelvic ultrasound.   Further diagnostic evaluation is indicated for recurrent or persistent bleeding. Will MyChart or call with EMB results and next steps.     Estil Mangle, DO [facility]

## 2024-07-28 ENCOUNTER — Encounter: Admitting: Physical Therapy

## 2024-07-28 NOTE — Therapy (Signed)
 OUTPATIENT PHYSICAL THERAPY CERVICAL and HIP TREATMENT   Patient Name: Nicole Walter MRN: [id number] DOB:1961/08/05, 63 y.o., female Today's Date: 07/29/2024  END OF SESSION:  PT End of Session - 07/29/24 0823     Visit Number 6    Number of Visits 24    Date for Recertification  09/26/24    Progress Note Due on Visit 10    PT Start Time 0816    PT Stop Time 0846    PT Time Calculation (min) 30 min    Equipment Utilized During Treatment Gait belt    Activity Tolerance Patient tolerated treatment well    Behavior During Therapy [facility] for tasks assessed/performed             Past Medical History:  Diagnosis Date   Abnormal liver enzymes 02/08/2016   Allergy    Anxiety    Chest pain 07/05/2016   Chronic pain    Depression    DJD (degenerative joint disease)    lumbar   Fibromyalgia    High cholesterol    Rash of back 01/28/2016   Weight loss due to medication    Past Surgical History:  Procedure Laterality Date   ANKLE SURGERY     x 2   BREAST BIOPSY Left 1997   CHOLECYSTECTOMY     COLONOSCOPY     COLONOSCOPY WITH PROPOFOL  N/A 02/22/2016   Procedure: COLONOSCOPY WITH PROPOFOL ;  Surgeon: Gladis RAYMOND Mariner, MD;  Location: [facility] ENDOSCOPY;  Service: Endoscopy;  Laterality: N/A;   ESOPHAGOGASTRODUODENOSCOPY (EGD) WITH PROPOFOL  N/A 02/22/2016   Procedure: ESOPHAGOGASTRODUODENOSCOPY (EGD) WITH PROPOFOL ;  Surgeon: Gladis RAYMOND Mariner, MD;  Location: [facility] ENDOSCOPY;  Service: Endoscopy;  Laterality: N/A;   NASAL SINUS SURGERY     SHOULDER ARTHROSCOPY WITH ROTATOR CUFF REPAIR AND SUBACROMIAL DECOMPRESSION Left 09/23/2019   Procedure: SHOULDER ARTHROSCOPY WITH MINI ROTATOR CUFF REPAIR AND SUBACROMIAL DECOMPRESSION, BICEPS TENODESIS;  Surgeon: Tobie Priest, MD;  Location: [facility];  Service: Orthopedics;  Laterality: Left;   TONSILLECTOMY     TUBAL LIGATION     Patient Active Problem List   Diagnosis Date Noted   Postmenopausal bleeding 07/29/2024   Dyspepsia  10/15/2023   Hot flash, menopausal 09/03/2023   Chronic bronchitis (HCC) 09/03/2023   Piriformis syndrome of right side 08/20/2023   Oral thrush 05/15/2023   Acute maxillary sinusitis 05/15/2023   Chronic radicular lumbar pain 03/20/2023   Exposure to sexually transmitted disease (STD) 01/25/2023   Acute vaginitis 01/25/2023   Abnormal cervical Papanicolaou smear 01/25/2023   Abnormal MRI, cervical spine 07/22/2019   Neurogenic pain 07/22/2019   DDD (degenerative disc disease), cervical 06/09/2019   Chronic musculoskeletal pain 05/14/2019   Needle phobia 09/26/2018   Spondylosis without myelopathy or radiculopathy, lumbar region 08/29/2018   Lumbar facet hypertrophy 08/29/2018   Lumbar facet syndrome (Right) 08/29/2018   DDD (degenerative disc disease), thoracic 08/29/2018   Thoracic spondylosis with radiculopathy (Right) 08/29/2018   Spinal enthesopathy of lumbar region 08/29/2018   Chronic midline low back pain without sciatica 08/29/2018   Generalized anxiety disorder 07/23/2018   Cervical facet hypertrophy (Bilateral) 07/18/2018   Cervical central spinal stenosis 07/18/2018   Chronic shoulder pain (Left) 07/09/2018   Cervicalgia (Bilateral) (L>R) 03/28/2018   Spondylosis without myelopathy or radiculopathy, cervical region 02/11/2018   Chronic pelvic pain in female 08/24/2017   Fatty liver 08/24/2017   Chronic low back pain (Bilateral) (L>R) 08/13/2017   Foraminal stenosis of cervical region 08/13/2017   Chronic low back pain 08/13/2017  Cervical sensory radiculopathy at C5 (Bilateral) 08/13/2017   Cervical radicular pain 08/13/2017   Constipation 07/16/2017   Myofascial pain syndrome, cervical (trapezius) (Left) 05/28/2017   Cervical facet syndrome (Bilateral) (L>R) 05/28/2017   Muscle spasm 05/15/2017   Chronic hip pain (Right) 03/19/2017   Chronic knee arthropathy (Right) 03/14/2017   Osteoarthritis of knee (Right) 02/21/2017   Osteoarthritis of shoulder (Right)  01/31/2017   Shortness of breath 01/15/2017   Thoracic spondylosis 01/01/2017   Radicular pain of thoracic region 12/21/2016   Osteoarthritis of hip (Bilateral) 11/20/2016   Greater trochanteric bursitis (Right) 09/18/2016   Subacromial bursitis of shoulder joint (Right) 09/18/2016   Overweight (BMI 25.0-29.9) 09/14/2016   ESR raised 08/31/2016   Elevated C-reactive protein (CRP) 08/31/2016   Long term (current) use of opiate analgesic 07/20/2016   Long term prescription opiate use 07/20/2016   Opiate use 07/20/2016   Chronic upper back pain (Secondary Area of Pain) (Bilateral) (L>R) 07/20/2016   Chronic shoulder pain (Tertiary Area of Pain) (Bilateral) (L>R) 07/20/2016   Chronic knee pain (Right) 07/20/2016   Chronic upper extremity pain (Left) 07/20/2016   Chronic lower extremity pain (Bilateral) (L>R) 07/20/2016   Chronic abdominal pain 07/20/2016   Chronic hip pain (Bilateral) (R>L) 05/31/2016   Chronic Greater trochanteric bursitis (Bilateral) (L>R) 05/31/2016   Bursitis of shoulder (Right) 05/17/2016   Skin lesions 04/27/2016   Hyperlipidemia 02/18/2016   Cervical spondylosis (Bilateral) 02/08/2016   Fibromyalgia 02/08/2016   Osteopenia 02/08/2016   Chronic neck pain (Primary Area of Pain) (Bilateral) (L>R) 01/28/2016   Concentration deficit 01/28/2016   Routine history and physical examination of adult 01/14/2016   Lumbar spondylosis 01/06/2016   Anxiety and depression 07/11/2015   Chronic pain syndrome 07/11/2015   Combined fat and carbohydrate induced hyperlipemia 07/11/2015    PCP: Dr. GORMAN Cinderella Perry  REFERRING PROVIDER: Dr. Wallie Sherry  REFERRING DIAG:  M54.12 (ICD-10-CM) - Cervical radicular pain  M54.2 (ICD-10-CM) - Cervicalgia  M16.11 (ICD-10-CM) - Primary osteoarthritis of right hip    THERAPY DIAG:  Abnormality of gait and mobility  Difficulty in walking, not elsewhere classified  Muscle weakness (generalized)  Other abnormalities of gait and  mobility  Other lack of coordination  Pain in right hip  Cervicalgia  Rationale for Evaluation and Treatment: Rehabilitation  ONSET DATE: 1990's per patient  SUBJECTIVE:  SUBJECTIVE STATEMENT: Patient reports increased stiffness overall today- especially in low back   Hand dominance: Right  PERTINENT HISTORY:  Patient has cervical and radicular pain into shoulders as well as hip pain. Has received injections with minimal relief. Was referred by Dr. Marcelino and dx of OA in hip and cervical radiculopathy with recent bout of diverticulitis.  PAIN:  Are you having pain? Yes: NPRS scale: 3-4/10 and down to 1-2/10 with oxycodone - 2x/day Pain location: R posterior/lateral Hip pain Pain description: achy, sharp/crampy at time/burning dull at times Aggravating factors: Prolonged walking, prolonged sitting, squatting Relieving factors: pain meds, ice  PRECAUTIONS: Fall  RED FLAGS: None     WEIGHT BEARING RESTRICTIONS: No  FALLS:  Has patient fallen in last 6 months? No  LIVING ENVIRONMENT: Lives with: lives alone Lives in: House/apartment Stairs: Yes: External: 4 steps; on right going up Has following equipment at home: None  OCCUPATION: Not currently-   PLOF: Independent  PATIENT GOALS: Being able to walk better without pain and sleep better.   NEXT MD VISIT: PCP 07/17/2024?  OBJECTIVE:  Note: Objective measures were completed at Evaluation unless otherwise noted.  DIAGNOSTIC FINDINGS:  CLINICAL DATA:  Back   EXAM: MRI CERVICAL, THORACIC AND LUMBAR SPINE WITHOUT CONTRAST   TECHNIQUE: Multiplanar and multiecho pulse sequences of the cervical spine, to include the craniocervical junction and cervicothoracic junction, and thoracic and lumbar spine, were obtained without  intravenous contrast.   COMPARISON:  09/04/2017 MRI cervical spine, 07/11/2016 MRI thoracic spine and 03/02/2016 MRI lumbar spine   FINDINGS: MRI CERVICAL SPINE FINDINGS   Alignment: Straightening and mild reversal of the normal cervical lordosis. Trace anterolisthesis of C2 on C3, C3 on C4, and C4 on C5, which is new from the prior exam and favored to be facet mediated.   Vertebrae: No acute fracture, evidence of discitis, or suspicious osseous lesion.   Cord: Normal signal and morphology.   Posterior Fossa, vertebral arteries, paraspinal tissues: Negative.   Disc levels:   C2-C3: No significant disc bulge. Left facet arthropathy. No spinal canal stenosis or neural foraminal narrowing.   C3-C4: Previously noted right paracentral disc protrusion is no longer seen. No significant disc bulge. Left-greater-than-right facet arthropathy. No spinal canal stenosis or neural foraminal narrowing.   C4-C5: Small central disc protrusion. Facet arthropathy. No spinal canal stenosis. Mild bilateral neural foraminal narrowing.   C5-C6: Mild disc bulge. Ligamentum flavum hypertrophy. Mild facet arthropathy. Mild-to-moderate spinal canal stenosis, which has progressed slightly from the prior exam. Moderate left and mild-to-moderate right neural foraminal narrowing, unchanged.   C6-C7: Mild disc bulge with increased size left paracentral disc extrusion, which indents the left aspect of the thecal sac and ventral spinal cord. Ligamentum flavum hypertrophy. Facet arthropathy. Moderate spinal canal stenosis, which has progressed from the prior exam, with unchanged mild bilateral neural foraminal narrowing.   C7-T1: No significant disc bulge. Left-greater-than-right facet arthropathy. No spinal canal stenosis. Mild left neural foraminal narrowing.   MRI THORACIC SPINE FINDINGS   Alignment: No listhesis. In mild levocurvature of the upper thoracic spine with dextrocurvature of the lower  thoracic spine and thoracolumbar junction. Straightening of the normal thoracic kyphosis.   Vertebrae: No acute fracture, evidence of discitis, or suspicious osseous lesion.   Cord:  Normal signal and morphology.   Paraspinal and other soft tissues: Negative.   Disc levels:   T1-T2: Facet arthropathy. No spinal canal stenosis. Moderate to severe right and moderate left neural foraminal narrowing, unchanged.   T2-T3: Right eccentric  disc bulge. Right greater than left facet arthropathy. No spinal canal stenosis. Severe right neural foraminal narrowing, unchanged.   T3-T4: Right eccentric disc bulge. Right greater than facet arthropathy. No spinal canal stenosis. Moderate right neural foraminal narrowing.   T4-T5: Facet arthropathy. No spinal canal stenosis or neural foraminal narrowing.   T5-T6: Left paracentral disc protrusion. No spinal canal stenosis or neural foraminal narrowing.   T6-T7: Small central disc protrusion. No spinal canal stenosis or neural foraminal narrowing.   T7-T8: Small right subarticular disc protrusion. No spinal canal stenosis or neural foraminal narrowing.   T8-T9: Facet arthropathy. No spinal canal stenosis or neural foraminal narrowing.   T9-T10: Moderate facet arthropathy. No spinal canal stenosis. Mild bilateral neural foraminal narrowing.   T10-T11: Moderate to severe facet arthropathy. No spinal canal stenosis. Mild bilateral neural foraminal narrowing.   T11-T12: Moderate facet arthropathy. No spinal canal stenosis. Mild left neural foraminal narrowing.   MRI LUMBAR SPINE FINDINGS   Segmentation:  5 lumbar type vertebral bodies.   Alignment: 5 mm anterolisthesis L4 on L5, which is new from the prior exam. Mild levocurvature.   Vertebrae: No acute fracture, evidence of discitis, or suspicious osseous lesion.   Conus medullaris and cauda equina: Conus extends to the L1 level. Conus and cauda equina appear normal.    Paraspinal and other soft tissues: Small renal cysts, for which no follow-up is currently indicated. No lymphadenopathy.   Disc levels:   T12-L1: No significant disc bulge. No spinal canal stenosis or neural foraminal narrowing.   L1-L2: No significant disc bulge. No spinal canal stenosis or neural foraminal narrowing.   L2-L3: Minimal disc bulge. No spinal canal stenosis or neural foraminal narrowing.   L3-L4: Mild disc bulge. Mild facet arthropathy. Narrowing of the lateral recesses. Mild spinal canal stenosis, which is new from the prior exam. Mild left-greater-than-right neural foraminal narrowing, similar to the prior.   L4-L5: Grade 1 anterolisthesis with disc unroofing and moderate disc bulge. Severe facet arthropathy. Prominent epidural fat. Effacement of the lateral recesses and moderate to severe spinal canal stenosis, which are new from the prior exam. Mild bilateral neural foraminal narrowing, which has progressed on the left.   L5-S1: No significant disc bulge. No spinal canal stenosis or neural foraminal narrowing.   IMPRESSION: CERVICAL SPINE:   1. C6-C7 moderate spinal canal stenosis, which has progressed from the prior exam, with unchanged mild bilateral neural foraminal narrowing. 2. C5-C6 mild-to-moderate spinal canal stenosis, which has progressed slightly from the prior exam, with unchanged moderate left and mild-to-moderate right neural foraminal narrowing. 3. C4-C5 mild bilateral neural foraminal narrowing.   THORACIC SPINE:   Overall unchanged degenerative findings in the thoracic spine, without significant spinal canal stenosis.   LUMBAR SPINE:   1. L4-L5 moderate to severe spinal canal stenosis, which is new from the prior exam, with effacement of the lateral recesses, likely compressing the descending L5 nerve roots, and mild bilateral neural foraminal narrowing, which has progressed on the left. 2. L3-L4 mild spinal canal stenosis, which  is new from the prior exam, with mild left-greater-than-right neural foraminal narrowing.     Electronically Signed   By: Donald Campion M.D.   On: 09/20/2023 20:41  PATIENT SURVEYS:  CLINICAL DATA:  Right hip pain and arthralgia.   EXAM: DG HIP (WITH OR WITHOUT PELVIS) 2-3V RIGHT   COMPARISON:  MRI right hip 02/07/2017   FINDINGS: No acute fracture or dislocation. Mild to moderate degenerative arthritis of both hips.   IMPRESSION:  Mild to moderate degenerative arthritis of both hips.     Electronically Signed   By: Norman Gatlin M.D.   On: 04/13/2024 23:52  COGNITION: Overall cognitive status: Within functional limits for tasks assessed  SENSATION: WFL  POSTURE: rounded shoulders, forward head, and weight shift left   Strength R/L *5/5 Hip flexion *5/5 Hip external rotation *5/5 Hip internal rotation 5/5 Hip extension  *4+/4+ Hip abduction 5/5 Hip adduction 5/5 Knee extension 5/5 Knee flexion 5/5 Ankle Dorsiflexion 5/5 Ankle Plantarflexion 5/5 Ankle Inversion 5/5 Ankle Eversion *indicates pain    Muscle Length Hamstring length (degrees): R/L: NT Quad length Arvid): NT Hip Flexor length Cathy): R/L: + Right IT band length Everlene): -   CERVICAL ROM:   Active ROM A/PROM (deg) eval  Flexion 18  Extension 32  Right lateral flexion 32  Left lateral flexion 31  Right rotation 72  Left rotation 59   (Blank rows = not tested)   PALPATION: (+) Tenderness just posterior and later to RSIJ, some tenderness in gluteal/piriformis, Gluteal med and TFL and Right hip flexor   FUNCTIONAL TESTS:  5 times sit to stand: 12.44 10 meter walk test: 12.25 sec or 0.82 m/s without AD  GAIT: Distance walked: 100 feet Assistive device utilized: None Level of assistance: Complete Independence Comments: antalgic on Right    TREATMENT DATE: 07/29/24    Manual therapy:   -PA mobs to T7-12, L1-5 x several min -STM to bilateral  thoracic/lumbar  paraspinals- x several min- Tighter on Right side vs. Left today --Hip ROM-  PA hip mobs x 30 bouts in Prone position -Thoracic Rotation mobs T3-T8 region x 30 bouts each in sidelye   Therex:  PROM B hamstring  x 30 sec x 3 each LE PROM Knee to chest x 30 sec x 3 each LE PROM Piriformis x 30 sec x 3 each LE PROM Figure 4 x 30 sec x 3 each LE PROM LTR each side x 30 sec  AROM- Sidelye- thoracic Rotation- open/closed book x 10 each side.    PATIENT EDUCATION:  Education details: Advanced HEP; Discussed role of muscle in posture Person educated: Patient Education method: Explanation, Demonstration, Tactile cues, Verbal cues, and Handouts Education comprehension: verbalized understanding, returned demonstration, verbal cues required, tactile cues required, and needs further education  HOME EXERCISE PROGRAM:  Access Code: [email or web address] URL: [email or web address] Date: 07/24/2024 Prepared by: Reyes London  Exercises - Standing Shoulder Row with Anchored Resistance  - 3 x weekly - 3 sets - 10 reps - Shoulder extension with resistance - Neutral  - 3 x weekly - 3 sets - 10 reps - Standing Shoulder Horizontal Abduction with Resistance  - 3 x weekly - 3 sets - 10 reps     Access Code: [email or web address] URL: [email or web address] Date: 07/15/2024 Prepared by: Reyes London  Exercises - Seated Scapular Retraction  - 3 x weekly - 3 sets - 10 reps - Seated Cervical Retraction  - 3 x weekly - 3 sets - 10 reps - Seated Assisted Cervical Rotation with Towel  - 1 x daily - 3 sets - 30 sec hold      Access Code: [email or web address] URL: [email or web address] Date: 07/04/2024 Prepared by: Reyes London & Updated Sidra Simpers  Exercises - Supine Hip Internal and External Rotation  - 1 x daily - 3 sets - 10 reps - Supine Lower Trunk Rotation  - 1 x daily - 3 sets - 10 reps - Hip Flexor Stretch at [facility] of Bed  -  1 x daily - 3 sets - 30 hold - Supine  Bridge  - 1 x daily - 7 x weekly - 3 sets - 10 reps - Clamshell  - 1 x daily - 7 x weekly - 3 sets - 10 reps - Sidelying Reverse Clamshell  - 1 x daily - 7 x weekly - 3 sets - 10 reps  ASSESSMENT:  CLINICAL IMPRESSION: Treatment limited secondary to patient late arrival. She presented with more stiffness overall today and treatment focused on pain relief and flexibility. She responded favorably to more passive stretching of low back and hip and stated feeling better after session.   Pt will continue to benefit from skilled therapy to address remaining deficits in order to improve overall QoL and return to PLOF.     OBJECTIVE IMPAIRMENTS: Abnormal gait, decreased activity tolerance, decreased balance, decreased endurance, decreased mobility, difficulty walking, decreased ROM, hypomobility, impaired flexibility, postural dysfunction, and pain.   ACTIVITY LIMITATIONS: carrying, lifting, bending, sitting, standing, squatting, sleeping, stairs, transfers, and bed mobility  PARTICIPATION LIMITATIONS: meal prep, cleaning, laundry, driving, shopping, community activity, and yard work  PERSONAL FACTORS: Past/current experiences, Time since onset of injury/illness/exacerbation, and 3+ comorbidities: chronic pain syndrome, Lumbar and cervical spondylosis, anxiety are also affecting patient's functional outcome.   REHAB POTENTIAL: Good  CLINICAL DECISION MAKING: Evolving/moderate complexity  EVALUATION COMPLEXITY: Moderate   GOALS: Goals reviewed with patient? Yes  SHORT TERM GOALS: Target date: 08/15/2024  Pt will be independent with HEP in order to decrease ankle pain and increase strength in order to improve pain-free function at home. Baseline: EVAL- No formal HEP in place Goal status: INITIAL   LONG TERM GOALS: Target date: 09/26/2024  Pt will increase LEFS by at least 9 points in order to demonstrate significant improvement in lower extremity function.  Baseline: EVAL= 36/80 Goal  status: INITIAL  2.  Pt will decrease worst pain as reported on NPRS by at least 2 points in order to demonstrate clinically significant reduction in R hip pain.  Baseline: EVAL= 4/10 right hip pain Goal status: INITIAL  3.  Pt will demonstrate improved gait speed > 1.0 m/s normal speed for improved functional mobility in community. Baseline: EVAL =0.82 m/s Goal status: INITIAL  4.  Pt will demonstrate increased active cervical ROM to at least 28 flexion, 42 extension, and 69 left rotation to improve ability to perform ADLs such as driving and head turning with reduced restriction.  Baseline: Flexion: 18; Extension: 32 deg; L Rotation: 59 deg    PLAN:  PT FREQUENCY: 1-2x/week  PT DURATION: 12 weeks  PLANNED INTERVENTIONS: [id number]- PT Re-evaluation, 97750- Physical Performance Testing, 97110-Therapeutic exercises, 97530- Therapeutic activity, [id number]- Neuromuscular re-education, 97535- Self Care, [id number]- Manual therapy, [id number]- Gait training, [id number]- Orthotic Initial, [id number]- Orthotic/Prosthetic subsequent, [phone]- Canalith repositioning, [id number]- Electrical stimulation (manual), [id number]- Traction (mechanical), 20560 (1-2 muscles), 20561 (3+ muscles)- Dry Needling, Patient/Family education, Balance training, Stair training, Taping, Joint mobilization, Joint manipulation, Spinal manipulation, Spinal mobilization, Vestibular training, Cryotherapy, and Moist heat  PLAN FOR NEXT SESSION:   Manual therapy for C-spine and hip Instruct in postural activities  Continue with hip ROM/pain relieving techniques/modalities Progress HEP as appropriate.    Chyrl London, PT  Physical Therapist - [facility]  07/29/24, 12:24 PM

## 2024-07-29 ENCOUNTER — Ambulatory Visit

## 2024-07-29 ENCOUNTER — Ambulatory Visit: Admitting: Obstetrics

## 2024-07-29 ENCOUNTER — Other Ambulatory Visit (HOSPITAL_COMMUNITY)
Admission: RE | Admit: 2024-07-29 | Discharge: 2024-07-29 | Disposition: A | Discharge status: Home / Self Care | Source: Ambulatory Visit | Attending: Obstetrics | Admitting: Obstetrics

## 2024-07-29 ENCOUNTER — Encounter: Payer: Self-pay | Admitting: Obstetrics

## 2024-07-29 VITALS — BP 112/68 | HR 68 | Ht 64.0 in | Wt 147.1 lb

## 2024-07-29 DIAGNOSIS — R278 Other lack of coordination: Secondary | ICD-10-CM

## 2024-07-29 DIAGNOSIS — R269 Unspecified abnormalities of gait and mobility: Secondary | ICD-10-CM

## 2024-07-29 DIAGNOSIS — N950 Postmenopausal bleeding: Secondary | ICD-10-CM | POA: Diagnosis present

## 2024-07-29 DIAGNOSIS — M542 Cervicalgia: Secondary | ICD-10-CM

## 2024-07-29 DIAGNOSIS — M25551 Pain in right hip: Secondary | ICD-10-CM

## 2024-07-29 DIAGNOSIS — R262 Difficulty in walking, not elsewhere classified: Secondary | ICD-10-CM

## 2024-07-29 DIAGNOSIS — N858 Other specified noninflammatory disorders of uterus: Secondary | ICD-10-CM | POA: Diagnosis not present

## 2024-07-29 DIAGNOSIS — R2689 Other abnormalities of gait and mobility: Secondary | ICD-10-CM

## 2024-07-29 DIAGNOSIS — M6281 Muscle weakness (generalized): Secondary | ICD-10-CM

## 2024-07-30 ENCOUNTER — Encounter

## 2024-07-31 ENCOUNTER — Ambulatory Visit

## 2024-08-01 LAB — SURGICAL PATHOLOGY

## 2024-08-04 ENCOUNTER — Ambulatory Visit: Payer: Self-pay | Admitting: Obstetrics

## 2024-08-04 ENCOUNTER — Encounter

## 2024-08-05 ENCOUNTER — Ambulatory Visit

## 2024-08-05 DIAGNOSIS — M25551 Pain in right hip: Secondary | ICD-10-CM

## 2024-08-05 DIAGNOSIS — M5459 Other low back pain: Secondary | ICD-10-CM

## 2024-08-05 DIAGNOSIS — R269 Unspecified abnormalities of gait and mobility: Secondary | ICD-10-CM | POA: Diagnosis not present

## 2024-08-05 DIAGNOSIS — M542 Cervicalgia: Secondary | ICD-10-CM

## 2024-08-05 DIAGNOSIS — M6281 Muscle weakness (generalized): Secondary | ICD-10-CM

## 2024-08-05 DIAGNOSIS — R2689 Other abnormalities of gait and mobility: Secondary | ICD-10-CM

## 2024-08-05 DIAGNOSIS — R278 Other lack of coordination: Secondary | ICD-10-CM

## 2024-08-05 DIAGNOSIS — R262 Difficulty in walking, not elsewhere classified: Secondary | ICD-10-CM

## 2024-08-05 NOTE — Therapy (Signed)
 OUTPATIENT PHYSICAL THERAPY CERVICAL and HIP TREATMENT   Patient Name: Nicole Walter MRN: [id number] DOB:Sep 26, 1961, 63 y.o., female Today's Date: 08/05/2024  END OF SESSION:  PT End of Session - 08/05/24 0811     Visit Number 7    Number of Visits 24    Date for Recertification  09/26/24    Progress Note Due on Visit 10    PT Start Time 0803    PT Stop Time 0845    PT Time Calculation (min) 42 min    Equipment Utilized During Treatment Gait belt    Activity Tolerance Patient tolerated treatment well    Behavior During Therapy [facility] for tasks assessed/performed              Past Medical History:  Diagnosis Date   Abnormal liver enzymes 02/08/2016   Allergy    Anxiety    Chest pain 07/05/2016   Chronic pain    Depression    DJD (degenerative joint disease)    lumbar   Fibromyalgia    High cholesterol    Rash of back 01/28/2016   Weight loss due to medication    Past Surgical History:  Procedure Laterality Date   ANKLE SURGERY     x 2   BREAST BIOPSY Left 1997   CHOLECYSTECTOMY     COLONOSCOPY     COLONOSCOPY WITH PROPOFOL  N/A 02/22/2016   Procedure: COLONOSCOPY WITH PROPOFOL ;  Surgeon: Gladis RAYMOND Mariner, MD;  Location: [facility] ENDOSCOPY;  Service: Endoscopy;  Laterality: N/A;   ESOPHAGOGASTRODUODENOSCOPY (EGD) WITH PROPOFOL  N/A 02/22/2016   Procedure: ESOPHAGOGASTRODUODENOSCOPY (EGD) WITH PROPOFOL ;  Surgeon: Gladis RAYMOND Mariner, MD;  Location: [facility] ENDOSCOPY;  Service: Endoscopy;  Laterality: N/A;   NASAL SINUS SURGERY     SHOULDER ARTHROSCOPY WITH ROTATOR CUFF REPAIR AND SUBACROMIAL DECOMPRESSION Left 09/23/2019   Procedure: SHOULDER ARTHROSCOPY WITH MINI ROTATOR CUFF REPAIR AND SUBACROMIAL DECOMPRESSION, BICEPS TENODESIS;  Surgeon: Tobie Priest, MD;  Location: [facility];  Service: Orthopedics;  Laterality: Left;   TONSILLECTOMY     TUBAL LIGATION     Patient Active Problem List   Diagnosis Date Noted   Postmenopausal bleeding 07/29/2024   Dyspepsia  10/15/2023   Hot flash, menopausal 09/03/2023   Chronic bronchitis (HCC) 09/03/2023   Piriformis syndrome of right side 08/20/2023   Oral thrush 05/15/2023   Acute maxillary sinusitis 05/15/2023   Chronic radicular lumbar pain 03/20/2023   Exposure to sexually transmitted disease (STD) 01/25/2023   Acute vaginitis 01/25/2023   Abnormal cervical Papanicolaou smear 01/25/2023   Abnormal MRI, cervical spine 07/22/2019   Neurogenic pain 07/22/2019   DDD (degenerative disc disease), cervical 06/09/2019   Chronic musculoskeletal pain 05/14/2019   Needle phobia 09/26/2018   Spondylosis without myelopathy or radiculopathy, lumbar region 08/29/2018   Lumbar facet hypertrophy 08/29/2018   Lumbar facet syndrome (Right) 08/29/2018   DDD (degenerative disc disease), thoracic 08/29/2018   Thoracic spondylosis with radiculopathy (Right) 08/29/2018   Spinal enthesopathy of lumbar region 08/29/2018   Chronic midline low back pain without sciatica 08/29/2018   Generalized anxiety disorder 07/23/2018   Cervical facet hypertrophy (Bilateral) 07/18/2018   Cervical central spinal stenosis 07/18/2018   Chronic shoulder pain (Left) 07/09/2018   Cervicalgia (Bilateral) (L>R) 03/28/2018   Spondylosis without myelopathy or radiculopathy, cervical region 02/11/2018   Chronic pelvic pain in female 08/24/2017   Fatty liver 08/24/2017   Chronic low back pain (Bilateral) (L>R) 08/13/2017   Foraminal stenosis of cervical region 08/13/2017   Chronic low back pain  08/13/2017   Cervical sensory radiculopathy at C5 (Bilateral) 08/13/2017   Cervical radicular pain 08/13/2017   Constipation 07/16/2017   Myofascial pain syndrome, cervical (trapezius) (Left) 05/28/2017   Cervical facet syndrome (Bilateral) (L>R) 05/28/2017   Muscle spasm 05/15/2017   Chronic hip pain (Right) 03/19/2017   Chronic knee arthropathy (Right) 03/14/2017   Osteoarthritis of knee (Right) 02/21/2017   Osteoarthritis of shoulder (Right)  01/31/2017   Shortness of breath 01/15/2017   Thoracic spondylosis 01/01/2017   Radicular pain of thoracic region 12/21/2016   Osteoarthritis of hip (Bilateral) 11/20/2016   Greater trochanteric bursitis (Right) 09/18/2016   Subacromial bursitis of shoulder joint (Right) 09/18/2016   Overweight (BMI 25.0-29.9) 09/14/2016   ESR raised 08/31/2016   Elevated C-reactive protein (CRP) 08/31/2016   Long term (current) use of opiate analgesic 07/20/2016   Long term prescription opiate use 07/20/2016   Opiate use 07/20/2016   Chronic upper back pain (Secondary Area of Pain) (Bilateral) (L>R) 07/20/2016   Chronic shoulder pain (Tertiary Area of Pain) (Bilateral) (L>R) 07/20/2016   Chronic knee pain (Right) 07/20/2016   Chronic upper extremity pain (Left) 07/20/2016   Chronic lower extremity pain (Bilateral) (L>R) 07/20/2016   Chronic abdominal pain 07/20/2016   Chronic hip pain (Bilateral) (R>L) 05/31/2016   Chronic Greater trochanteric bursitis (Bilateral) (L>R) 05/31/2016   Bursitis of shoulder (Right) 05/17/2016   Skin lesions 04/27/2016   Hyperlipidemia 02/18/2016   Cervical spondylosis (Bilateral) 02/08/2016   Fibromyalgia 02/08/2016   Osteopenia 02/08/2016   Chronic neck pain (Primary Area of Pain) (Bilateral) (L>R) 01/28/2016   Concentration deficit 01/28/2016   Routine history and physical examination of adult 01/14/2016   Lumbar spondylosis 01/06/2016   Anxiety and depression 07/11/2015   Chronic pain syndrome 07/11/2015   Combined fat and carbohydrate induced hyperlipemia 07/11/2015    PCP: Dr. GORMAN Cinderella Perry  REFERRING PROVIDER: Dr. Wallie Sherry  REFERRING DIAG:  M54.12 (ICD-10-CM) - Cervical radicular pain  M54.2 (ICD-10-CM) - Cervicalgia  M16.11 (ICD-10-CM) - Primary osteoarthritis of right hip    THERAPY DIAG:  Abnormality of gait and mobility  Difficulty in walking, not elsewhere classified  Muscle weakness (generalized)  Other abnormalities of gait and  mobility  Other lack of coordination  Pain in right hip  Cervicalgia  Other low back pain  Rationale for Evaluation and Treatment: Rehabilitation  ONSET DATE: 1990's per patient  SUBJECTIVE:  SUBJECTIVE STATEMENT: Patient reports the rainy weather always makes her feel worse. Complaining of mostly stiffness today.  Hand dominance: Right  PERTINENT HISTORY:  Patient has cervical and radicular pain into shoulders as well as hip pain. Has received injections with minimal relief. Was referred by Dr. Marcelino and dx of OA in hip and cervical radiculopathy with recent bout of diverticulitis.  PAIN:  Are you having pain? Yes: NPRS scale: 3-4/10 and down to 1-2/10 with oxycodone - 2x/day Pain location: R posterior/lateral Hip pain Pain description: achy, sharp/crampy at time/burning dull at times Aggravating factors: Prolonged walking, prolonged sitting, squatting Relieving factors: pain meds, ice  PRECAUTIONS: Fall  RED FLAGS: None     WEIGHT BEARING RESTRICTIONS: No  FALLS:  Has patient fallen in last 6 months? No  LIVING ENVIRONMENT: Lives with: lives alone Lives in: House/apartment Stairs: Yes: External: 4 steps; on right going up Has following equipment at home: None  OCCUPATION: Not currently-   PLOF: Independent  PATIENT GOALS: Being able to walk better without pain and sleep better.   NEXT MD VISIT: PCP 07/17/2024?  OBJECTIVE:  Note: Objective measures were completed at Evaluation unless otherwise noted.  DIAGNOSTIC FINDINGS:  CLINICAL DATA:  Back   EXAM: MRI CERVICAL, THORACIC AND LUMBAR SPINE WITHOUT CONTRAST   TECHNIQUE: Multiplanar and multiecho pulse sequences of the cervical spine, to include the craniocervical junction and cervicothoracic  junction, and thoracic and lumbar spine, were obtained without intravenous contrast.   COMPARISON:  09/04/2017 MRI cervical spine, 07/11/2016 MRI thoracic spine and 03/02/2016 MRI lumbar spine   FINDINGS: MRI CERVICAL SPINE FINDINGS   Alignment: Straightening and mild reversal of the normal cervical lordosis. Trace anterolisthesis of C2 on C3, C3 on C4, and C4 on C5, which is new from the prior exam and favored to be facet mediated.   Vertebrae: No acute fracture, evidence of discitis, or suspicious osseous lesion.   Cord: Normal signal and morphology.   Posterior Fossa, vertebral arteries, paraspinal tissues: Negative.   Disc levels:   C2-C3: No significant disc bulge. Left facet arthropathy. No spinal canal stenosis or neural foraminal narrowing.   C3-C4: Previously noted right paracentral disc protrusion is no longer seen. No significant disc bulge. Left-greater-than-right facet arthropathy. No spinal canal stenosis or neural foraminal narrowing.   C4-C5: Small central disc protrusion. Facet arthropathy. No spinal canal stenosis. Mild bilateral neural foraminal narrowing.   C5-C6: Mild disc bulge. Ligamentum flavum hypertrophy. Mild facet arthropathy. Mild-to-moderate spinal canal stenosis, which has progressed slightly from the prior exam. Moderate left and mild-to-moderate right neural foraminal narrowing, unchanged.   C6-C7: Mild disc bulge with increased size left paracentral disc extrusion, which indents the left aspect of the thecal sac and ventral spinal cord. Ligamentum flavum hypertrophy. Facet arthropathy. Moderate spinal canal stenosis, which has progressed from the prior exam, with unchanged mild bilateral neural foraminal narrowing.   C7-T1: No significant disc bulge. Left-greater-than-right facet arthropathy. No spinal canal stenosis. Mild left neural foraminal narrowing.   MRI THORACIC SPINE FINDINGS   Alignment: No listhesis. In mild  levocurvature of the upper thoracic spine with dextrocurvature of the lower thoracic spine and thoracolumbar junction. Straightening of the normal thoracic kyphosis.   Vertebrae: No acute fracture, evidence of discitis, or suspicious osseous lesion.   Cord:  Normal signal and morphology.   Paraspinal and other soft tissues: Negative.   Disc levels:   T1-T2: Facet arthropathy. No spinal canal stenosis. Moderate to severe right and moderate left neural foraminal narrowing, unchanged.  T2-T3: Right eccentric disc bulge. Right greater than left facet arthropathy. No spinal canal stenosis. Severe right neural foraminal narrowing, unchanged.   T3-T4: Right eccentric disc bulge. Right greater than facet arthropathy. No spinal canal stenosis. Moderate right neural foraminal narrowing.   T4-T5: Facet arthropathy. No spinal canal stenosis or neural foraminal narrowing.   T5-T6: Left paracentral disc protrusion. No spinal canal stenosis or neural foraminal narrowing.   T6-T7: Small central disc protrusion. No spinal canal stenosis or neural foraminal narrowing.   T7-T8: Small right subarticular disc protrusion. No spinal canal stenosis or neural foraminal narrowing.   T8-T9: Facet arthropathy. No spinal canal stenosis or neural foraminal narrowing.   T9-T10: Moderate facet arthropathy. No spinal canal stenosis. Mild bilateral neural foraminal narrowing.   T10-T11: Moderate to severe facet arthropathy. No spinal canal stenosis. Mild bilateral neural foraminal narrowing.   T11-T12: Moderate facet arthropathy. No spinal canal stenosis. Mild left neural foraminal narrowing.   MRI LUMBAR SPINE FINDINGS   Segmentation:  5 lumbar type vertebral bodies.   Alignment: 5 mm anterolisthesis L4 on L5, which is new from the prior exam. Mild levocurvature.   Vertebrae: No acute fracture, evidence of discitis, or suspicious osseous lesion.   Conus medullaris and cauda equina: Conus  extends to the L1 level. Conus and cauda equina appear normal.   Paraspinal and other soft tissues: Small renal cysts, for which no follow-up is currently indicated. No lymphadenopathy.   Disc levels:   T12-L1: No significant disc bulge. No spinal canal stenosis or neural foraminal narrowing.   L1-L2: No significant disc bulge. No spinal canal stenosis or neural foraminal narrowing.   L2-L3: Minimal disc bulge. No spinal canal stenosis or neural foraminal narrowing.   L3-L4: Mild disc bulge. Mild facet arthropathy. Narrowing of the lateral recesses. Mild spinal canal stenosis, which is new from the prior exam. Mild left-greater-than-right neural foraminal narrowing, similar to the prior.   L4-L5: Grade 1 anterolisthesis with disc unroofing and moderate disc bulge. Severe facet arthropathy. Prominent epidural fat. Effacement of the lateral recesses and moderate to severe spinal canal stenosis, which are new from the prior exam. Mild bilateral neural foraminal narrowing, which has progressed on the left.   L5-S1: No significant disc bulge. No spinal canal stenosis or neural foraminal narrowing.   IMPRESSION: CERVICAL SPINE:   1. C6-C7 moderate spinal canal stenosis, which has progressed from the prior exam, with unchanged mild bilateral neural foraminal narrowing. 2. C5-C6 mild-to-moderate spinal canal stenosis, which has progressed slightly from the prior exam, with unchanged moderate left and mild-to-moderate right neural foraminal narrowing. 3. C4-C5 mild bilateral neural foraminal narrowing.   THORACIC SPINE:   Overall unchanged degenerative findings in the thoracic spine, without significant spinal canal stenosis.   LUMBAR SPINE:   1. L4-L5 moderate to severe spinal canal stenosis, which is new from the prior exam, with effacement of the lateral recesses, likely compressing the descending L5 nerve roots, and mild bilateral neural foraminal narrowing, which has  progressed on the left. 2. L3-L4 mild spinal canal stenosis, which is new from the prior exam, with mild left-greater-than-right neural foraminal narrowing.     Electronically Signed   By: Donald Campion M.D.   On: 09/20/2023 20:41  PATIENT SURVEYS:  CLINICAL DATA:  Right hip pain and arthralgia.   EXAM: DG HIP (WITH OR WITHOUT PELVIS) 2-3V RIGHT   COMPARISON:  MRI right hip 02/07/2017   FINDINGS: No acute fracture or dislocation. Mild to moderate degenerative arthritis of both hips.  IMPRESSION: Mild to moderate degenerative arthritis of both hips.     Electronically Signed   By: Norman Gatlin M.D.   On: 04/13/2024 23:52  COGNITION: Overall cognitive status: Within functional limits for tasks assessed  SENSATION: WFL  POSTURE: rounded shoulders, forward head, and weight shift left   Strength R/L *5/5 Hip flexion *5/5 Hip external rotation *5/5 Hip internal rotation 5/5 Hip extension  *4+/4+ Hip abduction 5/5 Hip adduction 5/5 Knee extension 5/5 Knee flexion 5/5 Ankle Dorsiflexion 5/5 Ankle Plantarflexion 5/5 Ankle Inversion 5/5 Ankle Eversion *indicates pain    Muscle Length Hamstring length (degrees): R/L: NT Quad length Arvid): NT Hip Flexor length Cathy): R/L: + Right IT band length Everlene): -   CERVICAL ROM:   Active ROM A/PROM (deg) eval  Flexion 18  Extension 32  Right lateral flexion 32  Left lateral flexion 31  Right rotation 72  Left rotation 59   (Blank rows = not tested)   PALPATION: (+) Tenderness just posterior and later to RSIJ, some tenderness in gluteal/piriformis, Gluteal med and TFL and Right hip flexor   FUNCTIONAL TESTS:  5 times sit to stand: 12.44 10 meter walk test: 12.25 sec or 0.82 m/s without AD  GAIT: Distance walked: 100 feet Assistive device utilized: None Level of assistance: Complete Independence Comments: antalgic on Right    TREATMENT DATE: 08/05/24   Therapeutic Activities: dynamic  therapeutic activities  designed to achieve improved functional performance  Nustep - interval training to promote LE strength/cardioresp endurance. PT sets up intervention, adjusts intensity throughout and monitors pt for response. Cuing for SPM/speed, pt maintains SPM in 40s    Therex:  AROM LTR each side x 30 sec  Assisted knee to chest with [place] Tball under heels x 20  TA contraction (hooklye) hold 5 sec 2 x 10 reps (minimal ability to contract)  Isometric (hooklying) Knees and hands pressing into ball (positioned on patients abdomen) hold 3 sec 2 x 10  Hooklying gluteal sets x 5 sec hold x 10  TA contract with heel slide (heel on Tball) x 10 reps  Quadraped- alt UE raises x 10 reps  Quadraped - alt LE ext x 10 reps Standing minisquats against wall with ball on wall x 12 reps (VC for technique- tighten core and feet apart     PATIENT EDUCATION:  Education details: Advanced HEP; Discussed role of muscle in posture Person educated: Patient Education method: Explanation, Demonstration, Tactile cues, Verbal cues, and Handouts Education comprehension: verbalized understanding, returned demonstration, verbal cues required, tactile cues required, and needs further education  HOME EXERCISE PROGRAM:  Access Code: [email or web address] URL: [email or web address] Date: 07/24/2024 Prepared by: Reyes London  Exercises - Standing Shoulder Row with Anchored Resistance  - 3 x weekly - 3 sets - 10 reps - Shoulder extension with resistance - Neutral  - 3 x weekly - 3 sets - 10 reps - Standing Shoulder Horizontal Abduction with Resistance  - 3 x weekly - 3 sets - 10 reps     Access Code: [email or web address] URL: [email or web address] Date: 07/15/2024 Prepared by: Reyes London  Exercises - Seated Scapular Retraction  - 3 x weekly - 3 sets - 10 reps - Seated Cervical Retraction  - 3 x weekly - 3 sets - 10 reps - Seated Assisted Cervical Rotation with Towel  - 1 x daily -  3 sets - 30 sec hold      Access Code: [email or web address] URL: [email or web address] Date: 07/04/2024 Prepared by: Reyes London & Updated Sidra Simpers  Exercises - Supine Hip Internal and External Rotation  - 1 x daily - 3 sets - 10 reps - Supine Lower Trunk Rotation  - 1 x daily - 3 sets - 10 reps - Hip Flexor Stretch at Edge of Bed  - 1 x daily - 3 sets - 30 hold - Supine Bridge  - 1 x daily - 7 x weekly - 3 sets - 10 reps - Clamshell  - 1 x daily - 7 x weekly - 3 sets - 10 reps - Sidelying Reverse Clamshell  - 1 x daily - 7 x weekly - 3 sets - 10 reps  ASSESSMENT:  CLINICAL IMPRESSION: Patient able to progress to more core stabilization with good results today. She was initially stiff and responded well overall. She presented with increased core weakness and difficulty contracting transverse abdominals but did improve overall with practice. She will benefit from review next session.  Pt will continue to benefit from skilled therapy to address remaining deficits in order to improve overall QoL and return to PLOF.     OBJECTIVE IMPAIRMENTS: Abnormal gait, decreased activity tolerance, decreased balance, decreased endurance, decreased mobility, difficulty walking, decreased ROM, hypomobility, impaired flexibility, postural dysfunction, and pain.   ACTIVITY LIMITATIONS: carrying, lifting, bending, sitting, standing, squatting, sleeping, stairs, transfers, and bed mobility  PARTICIPATION LIMITATIONS: meal prep, cleaning, laundry, driving, shopping, community activity, and yard work  PERSONAL FACTORS: Past/current experiences, Time since onset of injury/illness/exacerbation, and 3+ comorbidities: chronic pain syndrome, Lumbar and cervical spondylosis, anxiety are also affecting patient's functional outcome.   REHAB POTENTIAL: Good  CLINICAL DECISION MAKING: Evolving/moderate complexity  EVALUATION COMPLEXITY: Moderate   GOALS: Goals reviewed with patient? Yes  SHORT  TERM GOALS: Target date: 08/15/2024  Pt will be independent with HEP in order to decrease ankle pain and increase strength in order to improve pain-free function at home. Baseline: EVAL- No formal HEP in place Goal status: INITIAL   LONG TERM GOALS: Target date: 09/26/2024  Pt will increase LEFS by at least 9 points in order to demonstrate significant improvement in lower extremity function.  Baseline: EVAL= 36/80 Goal status: INITIAL  2.  Pt will decrease worst pain as reported on NPRS by at least 2 points in order to demonstrate clinically significant reduction in R hip pain.  Baseline: EVAL= 4/10 right hip pain Goal status: INITIAL  3.  Pt will demonstrate improved gait speed > 1.0 m/s normal speed for improved functional mobility in community. Baseline: EVAL =0.82 m/s Goal status: INITIAL  4.  Pt will demonstrate increased active cervical ROM to at least 28 flexion, 42 extension, and 69 left rotation to improve ability to perform ADLs such as driving and head turning with reduced restriction.  Baseline: Flexion: 18; Extension: 32 deg; L Rotation: 59 deg    PLAN:  PT FREQUENCY: 1-2x/week  PT DURATION: 12 weeks  PLANNED INTERVENTIONS: [id number]- PT Re-evaluation, 97750- Physical Performance Testing, 97110-Therapeutic exercises, 97530- Therapeutic activity, [id number]- Neuromuscular re-education, 97535- Self Care, [id number]- Manual therapy, [id number]- Gait training, [id number]- Orthotic Initial, [id number]- Orthotic/Prosthetic subsequent, [phone]- Canalith repositioning, [id number]- Electrical stimulation (manual), [id number]- Traction (mechanical), 20560 (1-2 muscles), 20561 (3+ muscles)- Dry Needling, Patient/Family education, Balance training, Stair training, Taping, Joint mobilization, Joint manipulation, Spinal manipulation, Spinal mobilization, Vestibular training, Cryotherapy, and Moist heat  PLAN FOR NEXT SESSION:  Core stabilization  Manual therapy for C-spine and hip Instruct in postural activities   Continue with hip ROM/pain relieving techniques/modalities Progress HEP as appropriate.    Chyrl London, PT  Physical Therapist - [facility]  08/05/24, 8:50 AM

## 2024-08-06 ENCOUNTER — Encounter

## 2024-08-08 ENCOUNTER — Encounter

## 2024-08-11 ENCOUNTER — Encounter

## 2024-08-12 ENCOUNTER — Ambulatory Visit

## 2024-08-13 ENCOUNTER — Encounter

## 2024-08-13 ENCOUNTER — Ambulatory Visit: Admitting: Internal Medicine

## 2024-08-13 ENCOUNTER — Encounter: Payer: Self-pay | Admitting: Internal Medicine

## 2024-08-13 DIAGNOSIS — Z0130 Encounter for examination of blood pressure without abnormal findings: Secondary | ICD-10-CM

## 2024-08-13 DIAGNOSIS — M5441 Lumbago with sciatica, right side: Secondary | ICD-10-CM | POA: Diagnosis not present

## 2024-08-13 DIAGNOSIS — M5442 Lumbago with sciatica, left side: Secondary | ICD-10-CM | POA: Diagnosis not present

## 2024-08-13 DIAGNOSIS — F32A Depression, unspecified: Secondary | ICD-10-CM | POA: Diagnosis not present

## 2024-08-13 DIAGNOSIS — G8929 Other chronic pain: Secondary | ICD-10-CM

## 2024-08-13 DIAGNOSIS — F419 Anxiety disorder, unspecified: Secondary | ICD-10-CM | POA: Diagnosis not present

## 2024-08-13 MED ORDER — LORAZEPAM 1 MG PO TABS: 1.0000 mg | ORAL_TABLET | Freq: Three times a day (TID) | ORAL | 1 refills | Status: DC | PRN

## 2024-08-13 MED ORDER — OXYCODONE HCL 5 MG PO TABS: 5.0000 mg | ORAL_TABLET | Freq: Two times a day (BID) | ORAL | 0 refills | Status: DC | PRN

## 2024-08-13 MED ORDER — NALOXONE HCL 4 MG/0.1ML NA LIQD: NASAL | 2 refills | Status: AC

## 2024-08-13 NOTE — Progress Notes (Signed)
 Established Patient Office Visit  Subjective:  Patient ID: Nicole Walter, female    DOB: 1961-04-22  Age: 63 y.o. MRN: [id number]  Chief Complaint  Patient presents with   Pain Management    PM    Here for pain management follow up. Chronic pain well controlled on current analgesia. Last drug screen satisfactory and pill counts have also been satisfactory. Recent nervous breakdown which was addressed at [facility] with adjustment in her antidepressants.     No other concerns at this time.   Past Medical History:  Diagnosis Date   Abnormal liver enzymes 02/08/2016   Allergy    Anxiety    Chest pain 07/05/2016   Chronic pain    Depression    DJD (degenerative joint disease)    lumbar   Fibromyalgia    High cholesterol    Rash of back 01/28/2016   Weight loss due to medication     Past Surgical History:  Procedure Laterality Date   ANKLE SURGERY     x 2   BREAST BIOPSY Left 1997   CHOLECYSTECTOMY     COLONOSCOPY     COLONOSCOPY WITH PROPOFOL  N/A 02/22/2016   Procedure: COLONOSCOPY WITH PROPOFOL ;  Surgeon: Gladis RAYMOND Mariner, MD;  Location: [facility] ENDOSCOPY;  Service: Endoscopy;  Laterality: N/A;   ESOPHAGOGASTRODUODENOSCOPY (EGD) WITH PROPOFOL  N/A 02/22/2016   Procedure: ESOPHAGOGASTRODUODENOSCOPY (EGD) WITH PROPOFOL ;  Surgeon: Gladis RAYMOND Mariner, MD;  Location: [facility] ENDOSCOPY;  Service: Endoscopy;  Laterality: N/A;   NASAL SINUS SURGERY     SHOULDER ARTHROSCOPY WITH ROTATOR CUFF REPAIR AND SUBACROMIAL DECOMPRESSION Left 09/23/2019   Procedure: SHOULDER ARTHROSCOPY WITH MINI ROTATOR CUFF REPAIR AND SUBACROMIAL DECOMPRESSION, BICEPS TENODESIS;  Surgeon: Tobie Priest, MD;  Location: [facility];  Service: Orthopedics;  Laterality: Left;   TONSILLECTOMY     TUBAL LIGATION      Social History   Socioeconomic History   Marital status: Legally Separated    Spouse name: Not on file   Number of children: Not on file   Years of education: Not on file   Highest education  level: Not on file  Occupational History   Not on file  Tobacco Use   Smoking status: Former    Current packs/day: 0.00    Average packs/day: 1 pack/day for 41.3 years (41.3 ttl pk-yrs)    Types: E-cigarettes, Cigarettes    Start date: 03/06/1974    Quit date: 07/08/2015    Years since quitting: 9.1   Smokeless tobacco: Never   Tobacco comments:    vapor cigarettes, no nicotene  Vaping Use   Vaping status: Every Day   Start date: 07/08/2015   Substances: Nicotine , Flavoring   Devices: [employer]  Substance and Sexual Activity   Alcohol use: No    Alcohol/week: 0.0 standard drinks of alcohol   Drug use: No   Sexual activity: Not Currently    Birth control/protection: Post-menopausal  Other Topics Concern   Not on file  Social History Narrative   Not on file   Social Drivers of Health   Financial Resource Strain: Medium Risk (06/03/2024)   Received from [facility]   Overall Financial Resource Strain (CARDIA)    Difficulty of Paying Living Expenses: Somewhat hard  Food Insecurity: Food Insecurity Present (06/03/2024)   Received from [facility]   Hunger Vital Sign    Within the past 12 months, you worried that your food would run out before you got the money to buy  more.: Often true    Within the past 12 months, the food you bought just didn't last and you didn't have money to get more.: Often true  Transportation Needs: No Transportation Needs (06/03/2024)   Received from [facility] - Transportation    In the past 12 months, has lack of transportation kept you from medical appointments or from getting medications?: No    Lack of Transportation (Non-Medical): No  Physical Activity: Not on file  Stress: Not on file  Social Connections: Not on file  Intimate Partner Violence: Not on file    Family History  Problem Relation Age of Onset   Heart disease Mother    Stroke Mother    Cancer Father        lung    Arthritis Father    Arthritis Paternal Grandmother    Arthritis Paternal Grandfather    Drug abuse Sister    Drug abuse Brother    Post-traumatic stress disorder Brother    Diabetes Sister    Dementia Sister    Drug abuse Brother    Cancer Brother     Allergies  Allergen Reactions   Other Hives and Other (See Comments)    Pt states she was tested for allergies and peas was one that she is allergic to but has never ate enough of them to see a reaction. Pt states when she is around bird feathers she gets the hives FEATHERS   Adhesive [Tape] Hives   Food     PEAS/ patient does not know what type reaction she has/ from allergy test during childhood   Statins Other (See Comments)    Severe myalgia   Pea Other (See Comments)    Other reaction(Nevayah Faust): Unknown.  Pt states she was tested for allergies and peas was one that she is allergic to but has never ate enough of them to see a reaction.    Outpatient Medications Prior to Visit  Medication Sig   Azelastine  HCl 137 MCG/SPRAY SOLN Place 1 Inhalation into the nose daily.   Calcium Carb-Cholecalciferol (CALCIUM 600 + D) 600-5 MG-MCG TABS Take by mouth.   cetirizine  (ZYRTEC ) 10 MG tablet Take 1 tablet (10 mg total) by mouth every morning.   ezetimibe  (ZETIA ) 10 MG tablet Take 1 tablet (10 mg total) by mouth daily.   fenofibrate  160 MG tablet Take 1 tablet (160 mg total) by mouth daily. (Patient not taking: Reported on 07/29/2024)   ibuprofen  (ADVIL ) 800 MG tablet TAKE 1 TABLET BY MOUTH EVERY 8 HOURS AS NEEDED   LINZESS  72 MCG capsule TK 1 C PO QD   LORazepam  (ATIVAN ) 1 MG tablet Take 1 tablet (1 mg total) by mouth every 8 (eight) hours as needed. for anxiety   Multiple Vitamin (MULTI-VITAMINS) TABS Take 1 tablet by mouth daily.   nicotine  (NICODERM CQ  - DOSED IN MG/24 HOURS) 14 mg/24hr patch Place 1 patch (14 mg total) onto the skin daily. (Patient not taking: Reported on 06/16/2024)   omeprazole (PRILOSEC) 40 MG capsule Take 40 mg by mouth  daily.   ondansetron  (ZOFRAN -ODT) 4 MG disintegrating tablet Take 1 tablet (4 mg total) by mouth every 8 (eight) hours as needed for nausea or vomiting.   oxyCODONE  (OXY IR/ROXICODONE ) 5 MG immediate release tablet Take 1 tablet (5 mg total) by mouth 2 (two) times daily as needed for severe pain (pain score 7-10). Prescribed by Albina GORMAN Dine, MD   Vilazodone HCl (VIIBRYD) 10 MG TABS Take  10 mg by mouth daily.   No facility-administered medications prior to visit.    Review of Systems  Constitutional:  Negative for weight loss (gained 2 lbs).  HENT: Negative.    Eyes: Negative.   Cardiovascular: Negative.   Gastrointestinal: Negative.   Genitourinary: Negative.        Urge incontinence  Musculoskeletal:  Positive for back pain, joint pain and neck pain.       As in hpi  Skin: Negative.   Neurological: Negative.   Endo/Heme/Allergies: Negative.   Psychiatric/Behavioral:  The patient is nervous/anxious.        Objective:   BP 122/70   Pulse 60   Temp (!) 96.7 F (35.9 C)   Ht 5' 4 (1.626 m)   Wt 146 lb (66.2 kg)   SpO2 98%   BMI 25.06 kg/m   Vitals:   08/13/24 0911  BP: 122/70  Pulse: 60  Temp: (!) 96.7 F (35.9 C)  Height: 5' 4 (1.626 m)  Weight: 146 lb (66.2 kg)  SpO2: 98%  BMI (Calculated): 25.05    Physical Exam Vitals reviewed.  Constitutional:      General: She is not in acute distress. HENT:     Head: Normocephalic.     Nose: Nose normal.     Mouth/Throat:     Mouth: Mucous membranes are moist.  Eyes:     Extraocular Movements: Extraocular movements intact.     Pupils: Pupils are equal, round, and reactive to light.  Cardiovascular:     Rate and Rhythm: Normal rate and regular rhythm.     Heart sounds: No murmur heard. Pulmonary:     Effort: Pulmonary effort is normal.     Breath sounds: No rhonchi or rales.  Abdominal:     General: Abdomen is flat.     Palpations: There is no hepatomegaly, splenomegaly or mass.     Tenderness: There  is abdominal tenderness in the left lower quadrant. There is no guarding or rebound.  Musculoskeletal:        General: Normal range of motion.     Cervical back: Normal range of motion. No tenderness.  Skin:    General: Skin is warm and dry.  Neurological:     General: No focal deficit present.     Mental Status: She is alert and oriented to person, place, and time.     Cranial Nerves: No cranial nerve deficit.     Motor: No weakness.  Psychiatric:        Mood and Affect: Mood normal.        Behavior: Behavior normal.      No results found for any visits on 08/13/24.      Assessment & Plan:  Amalya was seen today for pain management.  Chronic low back pain (Bilateral) (L>R)    Problem List Items Addressed This Visit       Nervous and Auditory   Chronic low back pain (Bilateral) (L>R) (Chronic)    No follow-ups on file.   Total time spent: 20 minutes  Sherrill Cinderella Perry, MD  08/13/2024   This document may have been prepared by [facility] Voice Recognition software and as such may include unintentional dictation errors.

## 2024-08-15 ENCOUNTER — Encounter

## 2024-08-18 ENCOUNTER — Encounter

## 2024-08-18 ENCOUNTER — Ambulatory Visit: Attending: Student in an Organized Health Care Education/Training Program

## 2024-08-18 DIAGNOSIS — M542 Cervicalgia: Secondary | ICD-10-CM | POA: Diagnosis present

## 2024-08-18 DIAGNOSIS — R269 Unspecified abnormalities of gait and mobility: Secondary | ICD-10-CM | POA: Diagnosis present

## 2024-08-18 DIAGNOSIS — M6281 Muscle weakness (generalized): Secondary | ICD-10-CM | POA: Diagnosis present

## 2024-08-18 DIAGNOSIS — M25551 Pain in right hip: Secondary | ICD-10-CM | POA: Diagnosis present

## 2024-08-18 DIAGNOSIS — R200 Anesthesia of skin: Secondary | ICD-10-CM | POA: Insufficient documentation

## 2024-08-18 DIAGNOSIS — R262 Difficulty in walking, not elsewhere classified: Secondary | ICD-10-CM | POA: Diagnosis present

## 2024-08-18 DIAGNOSIS — R2689 Other abnormalities of gait and mobility: Secondary | ICD-10-CM | POA: Insufficient documentation

## 2024-08-18 DIAGNOSIS — M5459 Other low back pain: Secondary | ICD-10-CM | POA: Insufficient documentation

## 2024-08-18 DIAGNOSIS — R278 Other lack of coordination: Secondary | ICD-10-CM | POA: Diagnosis present

## 2024-08-18 NOTE — Therapy (Signed)
 OUTPATIENT PHYSICAL THERAPY CERVICAL and HIP TREATMENT   Patient Name: Nicole Walter MRN: [id number] DOB:[birth year], 63 y.o., female Today's Date: 08/18/2024  END OF SESSION:  PT End of Session - 08/18/24 0809     Visit Number 8    Number of Visits 24    Date for Recertification  09/26/24    Progress Note Due on Visit 10    PT Start Time 0807    PT Stop Time 0848    PT Time Calculation (min) 41 min    Equipment Utilized During Treatment Gait belt    Activity Tolerance Patient tolerated treatment well    Behavior During Therapy [facility] for tasks assessed/performed               Past Medical History:  Diagnosis Date   Abnormal liver enzymes 02/08/2016   Allergy    Anxiety    Chest pain 07/05/2016   Chronic pain    Depression    DJD (degenerative joint disease)    lumbar   Fibromyalgia    High cholesterol    Rash of back 01/28/2016   Weight loss due to medication    Past Surgical History:  Procedure Laterality Date   ANKLE SURGERY     x 2   BREAST BIOPSY Left 1997   CHOLECYSTECTOMY     COLONOSCOPY     COLONOSCOPY WITH PROPOFOL  N/A 02/22/2016   Procedure: COLONOSCOPY WITH PROPOFOL ;  Surgeon: Gladis RAYMOND Mariner, MD;  Location: [facility] ENDOSCOPY;  Service: Endoscopy;  Laterality: N/A;   ESOPHAGOGASTRODUODENOSCOPY (EGD) WITH PROPOFOL  N/A 02/22/2016   Procedure: ESOPHAGOGASTRODUODENOSCOPY (EGD) WITH PROPOFOL ;  Surgeon: Gladis RAYMOND Mariner, MD;  Location: [facility] ENDOSCOPY;  Service: Endoscopy;  Laterality: N/A;   NASAL SINUS SURGERY     SHOULDER ARTHROSCOPY WITH ROTATOR CUFF REPAIR AND SUBACROMIAL DECOMPRESSION Left 09/23/2019   Procedure: SHOULDER ARTHROSCOPY WITH MINI ROTATOR CUFF REPAIR AND SUBACROMIAL DECOMPRESSION, BICEPS TENODESIS;  Surgeon: Tobie Priest, MD;  Location: [facility];  Service: Orthopedics;  Laterality: Left;   TONSILLECTOMY     TUBAL LIGATION     Patient Active Problem List   Diagnosis Date Noted   Postmenopausal bleeding 07/29/2024   Dyspepsia  10/15/2023   Hot flash, menopausal 09/03/2023   Chronic bronchitis (HCC) 09/03/2023   Piriformis syndrome of right side 08/20/2023   Oral thrush 05/15/2023   Acute maxillary sinusitis 05/15/2023   Chronic radicular lumbar pain 03/20/2023   Exposure to sexually transmitted disease (STD) 01/25/2023   Acute vaginitis 01/25/2023   Abnormal cervical Papanicolaou smear 01/25/2023   Abnormal MRI, cervical spine 07/22/2019   Neurogenic pain 07/22/2019   DDD (degenerative disc disease), cervical 06/09/2019   Chronic musculoskeletal pain 05/14/2019   Needle phobia 09/26/2018   Spondylosis without myelopathy or radiculopathy, lumbar region 08/29/2018   Lumbar facet hypertrophy 08/29/2018   Lumbar facet syndrome (Right) 08/29/2018   DDD (degenerative disc disease), thoracic 08/29/2018   Thoracic spondylosis with radiculopathy (Right) 08/29/2018   Spinal enthesopathy of lumbar region 08/29/2018   Chronic midline low back pain without sciatica 08/29/2018   Generalized anxiety disorder 07/23/2018   Cervical facet hypertrophy (Bilateral) 07/18/2018   Cervical central spinal stenosis 07/18/2018   Chronic shoulder pain (Left) 07/09/2018   Cervicalgia (Bilateral) (L>R) 03/28/2018   Spondylosis without myelopathy or radiculopathy, cervical region 02/11/2018   Chronic pelvic pain in female 08/24/2017   Fatty liver 08/24/2017   Chronic low back pain (Bilateral) (L>R) 08/13/2017   Foraminal stenosis of cervical region 08/13/2017   Chronic low back  pain 08/13/2017   Cervical sensory radiculopathy at C5 (Bilateral) 08/13/2017   Cervical radicular pain 08/13/2017   Constipation 07/16/2017   Myofascial pain syndrome, cervical (trapezius) (Left) 05/28/2017   Cervical facet syndrome (Bilateral) (L>R) 05/28/2017   Muscle spasm 05/15/2017   Chronic hip pain (Right) 03/19/2017   Chronic knee arthropathy (Right) 03/14/2017   Osteoarthritis of knee (Right) 02/21/2017   Osteoarthritis of shoulder (Right)  01/31/2017   Shortness of breath 01/15/2017   Thoracic spondylosis 01/01/2017   Radicular pain of thoracic region 12/21/2016   Osteoarthritis of hip (Bilateral) 11/20/2016   Greater trochanteric bursitis (Right) 09/18/2016   Subacromial bursitis of shoulder joint (Right) 09/18/2016   Overweight (BMI 25.0-29.9) 09/14/2016   ESR raised 08/31/2016   Elevated C-reactive protein (CRP) 08/31/2016   Long term (current) use of opiate analgesic 07/20/2016   Long term prescription opiate use 07/20/2016   Opiate use 07/20/2016   Chronic upper back pain (Secondary Area of Pain) (Bilateral) (L>R) 07/20/2016   Chronic shoulder pain (Tertiary Area of Pain) (Bilateral) (L>R) 07/20/2016   Chronic knee pain (Right) 07/20/2016   Chronic upper extremity pain (Left) 07/20/2016   Chronic lower extremity pain (Bilateral) (L>R) 07/20/2016   Chronic abdominal pain 07/20/2016   Chronic hip pain (Bilateral) (R>L) 05/31/2016   Chronic Greater trochanteric bursitis (Bilateral) (L>R) 05/31/2016   Bursitis of shoulder (Right) 05/17/2016   Skin lesions 04/27/2016   Hyperlipidemia 02/18/2016   Cervical spondylosis (Bilateral) 02/08/2016   Fibromyalgia 02/08/2016   Osteopenia 02/08/2016   Chronic neck pain (Primary Area of Pain) (Bilateral) (L>R) 01/28/2016   Concentration deficit 01/28/2016   Routine history and physical examination of adult 01/14/2016   Lumbar spondylosis 01/06/2016   Anxiety and depression 07/11/2015   Chronic pain syndrome 07/11/2015   Combined fat and carbohydrate induced hyperlipemia 07/11/2015    PCP: Dr. GORMAN Cinderella Perry  REFERRING PROVIDER: Dr. Wallie Sherry  REFERRING DIAG:  M54.12 (ICD-10-CM) - Cervical radicular pain  M54.2 (ICD-10-CM) - Cervicalgia  M16.11 (ICD-10-CM) - Primary osteoarthritis of right hip    THERAPY DIAG:  Abnormality of gait and mobility  Difficulty in walking, not elsewhere classified  Muscle weakness (generalized)  Other abnormalities of gait and  mobility  Other lack of coordination  Pain in right hip  Cervicalgia  Other low back pain  Numbness in both hands  Rationale for Evaluation and Treatment: Rehabilitation  ONSET DATE: 1990's per patient  SUBJECTIVE:  SUBJECTIVE STATEMENT: Patient reports busy weekend - still remodeling home - states neck is bothering her and right 3 fingers have been goind  Hand dominance: Right  PERTINENT HISTORY:  Patient has cervical and radicular pain into shoulders as well as hip pain. Has received injections with minimal relief. Was referred by Dr. Marcelino and dx of OA in hip and cervical radiculopathy with recent bout of diverticulitis.  PAIN:  Are you having pain? Yes: NPRS scale: 3-4/10 and down to 1-2/10 with oxycodone - 2x/day Pain location: R posterior/lateral Hip pain Pain description: achy, sharp/crampy at time/burning dull at times Aggravating factors: Prolonged walking, prolonged sitting, squatting Relieving factors: pain meds, ice  PRECAUTIONS: Fall  RED FLAGS: None     WEIGHT BEARING RESTRICTIONS: No  FALLS:  Has patient fallen in last 6 months? No  LIVING ENVIRONMENT: Lives with: lives alone Lives in: House/apartment Stairs: Yes: External: 4 steps; on right going up Has following equipment at home: None  OCCUPATION: Not currently-   PLOF: Independent  PATIENT GOALS: Being able to walk better without pain and sleep better.   NEXT MD VISIT: PCP 07/17/2024?  OBJECTIVE:  Note: Objective measures were completed at Evaluation unless otherwise noted.  DIAGNOSTIC FINDINGS:  CLINICAL DATA:  Back   EXAM: MRI CERVICAL, THORACIC AND LUMBAR SPINE WITHOUT CONTRAST   TECHNIQUE: Multiplanar and multiecho pulse sequences of the cervical spine, to include the craniocervical  junction and cervicothoracic junction, and thoracic and lumbar spine, were obtained without intravenous contrast.   COMPARISON:  09/04/2017 MRI cervical spine, 07/11/2016 MRI thoracic spine and 03/02/2016 MRI lumbar spine   FINDINGS: MRI CERVICAL SPINE FINDINGS   Alignment: Straightening and mild reversal of the normal cervical lordosis. Trace anterolisthesis of C2 on C3, C3 on C4, and C4 on C5, which is new from the prior exam and favored to be facet mediated.   Vertebrae: No acute fracture, evidence of discitis, or suspicious osseous lesion.   Cord: Normal signal and morphology.   Posterior Fossa, vertebral arteries, paraspinal tissues: Negative.   Disc levels:   C2-C3: No significant disc bulge. Left facet arthropathy. No spinal canal stenosis or neural foraminal narrowing.   C3-C4: Previously noted right paracentral disc protrusion is no longer seen. No significant disc bulge. Left-greater-than-right facet arthropathy. No spinal canal stenosis or neural foraminal narrowing.   C4-C5: Small central disc protrusion. Facet arthropathy. No spinal canal stenosis. Mild bilateral neural foraminal narrowing.   C5-C6: Mild disc bulge. Ligamentum flavum hypertrophy. Mild facet arthropathy. Mild-to-moderate spinal canal stenosis, which has progressed slightly from the prior exam. Moderate left and mild-to-moderate right neural foraminal narrowing, unchanged.   C6-C7: Mild disc bulge with increased size left paracentral disc extrusion, which indents the left aspect of the thecal sac and ventral spinal cord. Ligamentum flavum hypertrophy. Facet arthropathy. Moderate spinal canal stenosis, which has progressed from the prior exam, with unchanged mild bilateral neural foraminal narrowing.   C7-T1: No significant disc bulge. Left-greater-than-right facet arthropathy. No spinal canal stenosis. Mild left neural foraminal narrowing.   MRI THORACIC SPINE FINDINGS   Alignment: No  listhesis. In mild levocurvature of the upper thoracic spine with dextrocurvature of the lower thoracic spine and thoracolumbar junction. Straightening of the normal thoracic kyphosis.   Vertebrae: No acute fracture, evidence of discitis, or suspicious osseous lesion.   Cord:  Normal signal and morphology.   Paraspinal and other soft tissues: Negative.   Disc levels:   T1-T2: Facet arthropathy. No spinal canal stenosis. Moderate to severe right and moderate  left neural foraminal narrowing, unchanged.   T2-T3: Right eccentric disc bulge. Right greater than left facet arthropathy. No spinal canal stenosis. Severe right neural foraminal narrowing, unchanged.   T3-T4: Right eccentric disc bulge. Right greater than facet arthropathy. No spinal canal stenosis. Moderate right neural foraminal narrowing.   T4-T5: Facet arthropathy. No spinal canal stenosis or neural foraminal narrowing.   T5-T6: Left paracentral disc protrusion. No spinal canal stenosis or neural foraminal narrowing.   T6-T7: Small central disc protrusion. No spinal canal stenosis or neural foraminal narrowing.   T7-T8: Small right subarticular disc protrusion. No spinal canal stenosis or neural foraminal narrowing.   T8-T9: Facet arthropathy. No spinal canal stenosis or neural foraminal narrowing.   T9-T10: Moderate facet arthropathy. No spinal canal stenosis. Mild bilateral neural foraminal narrowing.   T10-T11: Moderate to severe facet arthropathy. No spinal canal stenosis. Mild bilateral neural foraminal narrowing.   T11-T12: Moderate facet arthropathy. No spinal canal stenosis. Mild left neural foraminal narrowing.   MRI LUMBAR SPINE FINDINGS   Segmentation:  5 lumbar type vertebral bodies.   Alignment: 5 mm anterolisthesis L4 on L5, which is new from the prior exam. Mild levocurvature.   Vertebrae: No acute fracture, evidence of discitis, or suspicious osseous lesion.   Conus medullaris and  cauda equina: Conus extends to the L1 level. Conus and cauda equina appear normal.   Paraspinal and other soft tissues: Small renal cysts, for which no follow-up is currently indicated. No lymphadenopathy.   Disc levels:   T12-L1: No significant disc bulge. No spinal canal stenosis or neural foraminal narrowing.   L1-L2: No significant disc bulge. No spinal canal stenosis or neural foraminal narrowing.   L2-L3: Minimal disc bulge. No spinal canal stenosis or neural foraminal narrowing.   L3-L4: Mild disc bulge. Mild facet arthropathy. Narrowing of the lateral recesses. Mild spinal canal stenosis, which is new from the prior exam. Mild left-greater-than-right neural foraminal narrowing, similar to the prior.   L4-L5: Grade 1 anterolisthesis with disc unroofing and moderate disc bulge. Severe facet arthropathy. Prominent epidural fat. Effacement of the lateral recesses and moderate to severe spinal canal stenosis, which are new from the prior exam. Mild bilateral neural foraminal narrowing, which has progressed on the left.   L5-S1: No significant disc bulge. No spinal canal stenosis or neural foraminal narrowing.   IMPRESSION: CERVICAL SPINE:   1. C6-C7 moderate spinal canal stenosis, which has progressed from the prior exam, with unchanged mild bilateral neural foraminal narrowing. 2. C5-C6 mild-to-moderate spinal canal stenosis, which has progressed slightly from the prior exam, with unchanged moderate left and mild-to-moderate right neural foraminal narrowing. 3. C4-C5 mild bilateral neural foraminal narrowing.   THORACIC SPINE:   Overall unchanged degenerative findings in the thoracic spine, without significant spinal canal stenosis.   LUMBAR SPINE:   1. L4-L5 moderate to severe spinal canal stenosis, which is new from the prior exam, with effacement of the lateral recesses, likely compressing the descending L5 nerve roots, and mild bilateral neural foraminal  narrowing, which has progressed on the left. 2. L3-L4 mild spinal canal stenosis, which is new from the prior exam, with mild left-greater-than-right neural foraminal narrowing.     Electronically Signed   By: Donald Campion M.D.   On: 09/20/2023 20:41  PATIENT SURVEYS:  CLINICAL DATA:  Right hip pain and arthralgia.   EXAM: DG HIP (WITH OR WITHOUT PELVIS) 2-3V RIGHT   COMPARISON:  MRI right hip 02/07/2017   FINDINGS: No acute fracture or dislocation. Mild  to moderate degenerative arthritis of both hips.   IMPRESSION: Mild to moderate degenerative arthritis of both hips.     Electronically Signed   By: Norman Gatlin M.D.   On: 04/13/2024 23:52  COGNITION: Overall cognitive status: Within functional limits for tasks assessed  SENSATION: WFL  POSTURE: rounded shoulders, forward head, and weight shift left   Strength R/L *5/5 Hip flexion *5/5 Hip external rotation *5/5 Hip internal rotation 5/5 Hip extension  *4+/4+ Hip abduction 5/5 Hip adduction 5/5 Knee extension 5/5 Knee flexion 5/5 Ankle Dorsiflexion 5/5 Ankle Plantarflexion 5/5 Ankle Inversion 5/5 Ankle Eversion *indicates pain    Muscle Length Hamstring length (degrees): R/L: NT Quad length Arvid): NT Hip Flexor length Cathy): R/L: + Right IT band length Everlene): -   CERVICAL ROM:   Active ROM A/PROM (deg) eval  Flexion 18  Extension 32  Right lateral flexion 32  Left lateral flexion 31  Right rotation 72  Left rotation 59   (Blank rows = not tested)   PALPATION: (+) Tenderness just posterior and later to RSIJ, some tenderness in gluteal/piriformis, Gluteal med and TFL and Right hip flexor   FUNCTIONAL TESTS:  5 times sit to stand: 12.44 10 meter walk test: 12.25 sec or 0.82 m/s without AD  GAIT: Distance walked: 100 feet Assistive device utilized: None Level of assistance: Complete Independence Comments: antalgic on Right    TREATMENT DATE: 08/18/24   Manual therapy:   -Suboccipital release- Hold 60 sec x 4 -STM to cervical paraspinals and R sided Upper traps x 15 -IASTM- using the Stick to right UT region x 4 min  NMR:  -Standing cervical Retraction at wall (hold 5 sec x 10)  -Standing Scap retraction x 10  -Standing at wall- wall angels x 10 reps -Facing wall- Y off wall x 10 reps   Therex: PROM to cervical SB and Rot AROM for R UT - 30 sec hold x 2 today  Self care/home management -added some of above activities to home program.   PATIENT EDUCATION:  Education details: Advanced HEP; Discussed role of muscle in posture Person educated: Patient Education method: Explanation, Demonstration, Tactile cues, Verbal cues, and Handouts Education comprehension: verbalized understanding, returned demonstration, verbal cues required, tactile cues required, and needs further education  HOME EXERCISE PROGRAM: Access Code: [email or web address] URL: [email or web address] Date: 08/18/2024 Prepared by: Reyes London  Exercises - Seated Scapular Retraction  - 3 x weekly - 3 sets - 10 reps - Seated Cervical Retraction  - 3 x weekly - 3 sets - 10 reps - Seated Assisted Cervical Rotation with Towel  - 1 x daily - 3 sets - 30 sec hold - Standing Shoulder Horizontal Abduction with Resistance  - 3 x weekly - 3 sets - 10 reps - Seated Upper Trapezius Stretch  - 1 x daily - 3 sets - 30 hold - Wall Angels  - 3 x weekly - 3 sets - 10 reps - Low Trap Setting at Wall  - 3 x weekly - 3 sets - 10 reps       Access Code: [email or web address] URL: [email or web address] Date: 07/24/2024 Prepared by: Reyes London  Exercises - Standing Shoulder Row with Anchored Resistance  - 3 x weekly - 3 sets - 10 reps - Shoulder extension with resistance - Neutral  - 3 x weekly - 3 sets - 10 reps - Standing Shoulder Horizontal Abduction with Resistance  - 3 x weekly - 3 sets - 10 reps     Access Code:  [email or web address] URL:  [email or web address] Date: 07/15/2024 Prepared by: Reyes London  Exercises - Seated Scapular Retraction  - 3 x weekly - 3 sets - 10 reps - Seated Cervical Retraction  - 3 x weekly - 3 sets - 10 reps - Seated Assisted Cervical Rotation with Towel  - 1 x daily - 3 sets - 30 sec hold      Access Code: [email or web address] URL: [email or web address] Date: 07/04/2024 Prepared by: Reyes London & Updated Sidra Simpers  Exercises - Supine Hip Internal and External Rotation  - 1 x daily - 3 sets - 10 reps - Supine Lower Trunk Rotation  - 1 x daily - 3 sets - 10 reps - Hip Flexor Stretch at Edge of Bed  - 1 x daily - 3 sets - 30 hold - Supine Bridge  - 1 x daily - 7 x weekly - 3 sets - 10 reps - Clamshell  - 1 x daily - 7 x weekly - 3 sets - 10 reps - Sidelying Reverse Clamshell  - 1 x daily - 7 x weekly - 3 sets - 10 reps  ASSESSMENT:  CLINICAL IMPRESSION: Treatment focused more on cervical issues this am. Patient responded with improved report of pain. Stated now more aware of her posture and how stress can affect her postural muscles. She was able to return demonstration for all activities today without report of any worsening numbness into right digits. She will benefit from review next session.  Pt will continue to benefit from skilled therapy to address remaining deficits in order to improve overall QoL and return to PLOF.     OBJECTIVE IMPAIRMENTS: Abnormal gait, decreased activity tolerance, decreased balance, decreased endurance, decreased mobility, difficulty walking, decreased ROM, hypomobility, impaired flexibility, postural dysfunction, and pain.   ACTIVITY LIMITATIONS: carrying, lifting, bending, sitting, standing, squatting, sleeping, stairs, transfers, and bed mobility  PARTICIPATION LIMITATIONS: meal prep, cleaning, laundry, driving, shopping, community activity, and yard work  PERSONAL FACTORS: Past/current experiences, Time since onset of  injury/illness/exacerbation, and 3+ comorbidities: chronic pain syndrome, Lumbar and cervical spondylosis, anxiety are also affecting patient's functional outcome.   REHAB POTENTIAL: Good  CLINICAL DECISION MAKING: Evolving/moderate complexity  EVALUATION COMPLEXITY: Moderate   GOALS: Goals reviewed with patient? Yes  SHORT TERM GOALS: Target date: 08/15/2024  Pt will be independent with HEP in order to decrease ankle pain and increase strength in order to improve pain-free function at home. Baseline: EVAL- No formal HEP in place Goal status: INITIAL   LONG TERM GOALS: Target date: 09/26/2024  Pt will increase LEFS by at least 9 points in order to demonstrate significant improvement in lower extremity function.  Baseline: EVAL= 36/80 Goal status: INITIAL  2.  Pt will decrease worst pain as reported on NPRS by at least 2 points in order to demonstrate clinically significant reduction in R hip pain.  Baseline: EVAL= 4/10 right hip pain Goal status: INITIAL  3.  Pt will demonstrate improved gait speed > 1.0 m/s normal speed for improved functional mobility in community. Baseline: EVAL =0.82 m/s Goal status: INITIAL  4.  Pt will demonstrate increased active cervical ROM to at least 28 flexion, 42 extension, and 69 left rotation to improve ability to perform ADLs such as driving and head turning with reduced restriction.  Baseline: Flexion: 18; Extension: 32 deg; L Rotation: 59 deg    PLAN:  PT FREQUENCY: 1-2x/week  PT DURATION: 12 weeks  PLANNED INTERVENTIONS: 97164- PT Re-evaluation, 97750- Physical Performance Testing, 97110-Therapeutic exercises, 97530- Therapeutic activity, 97112-  Neuromuscular re-education, [id number]- Self Care, [id number]- Manual therapy, [id number]- Gait training, [id number]- Orthotic Initial, [id number]- Orthotic/Prosthetic subsequent, [phone]- Canalith repositioning, [id number]- Electrical stimulation (manual), [id number]- Traction (mechanical), [phone] (1-2 muscles), [id number] (3+  muscles)- Dry Needling, Patient/Family education, Balance training, Stair training, Taping, Joint mobilization, Joint manipulation, Spinal manipulation, Spinal mobilization, Vestibular training, Cryotherapy, and Moist heat  PLAN FOR NEXT SESSION:  Core stabilization  Manual therapy for C-spine and hip Progress postural activities  Continue with hip ROM/pain relieving techniques/modalities Progress HEP as appropriate.    Chyrl London, PT  Physical Therapist - [facility]  08/18/24, 9:10 AM

## 2024-08-20 ENCOUNTER — Encounter

## 2024-08-22 ENCOUNTER — Encounter

## 2024-08-25 ENCOUNTER — Encounter

## 2024-08-25 ENCOUNTER — Ambulatory Visit

## 2024-08-27 ENCOUNTER — Encounter

## 2024-08-28 ENCOUNTER — Encounter: Admitting: Physical Therapy

## 2024-09-01 ENCOUNTER — Encounter

## 2024-09-02 ENCOUNTER — Ambulatory Visit

## 2024-09-03 ENCOUNTER — Encounter

## 2024-09-04 ENCOUNTER — Encounter

## 2024-09-09 ENCOUNTER — Encounter: Payer: Self-pay | Admitting: Internal Medicine

## 2024-09-09 ENCOUNTER — Ambulatory Visit

## 2024-09-09 ENCOUNTER — Other Ambulatory Visit: Payer: Self-pay

## 2024-09-09 DIAGNOSIS — G8929 Other chronic pain: Secondary | ICD-10-CM

## 2024-09-09 DIAGNOSIS — F32A Depression, unspecified: Secondary | ICD-10-CM

## 2024-09-09 MED ORDER — IBUPROFEN 800 MG PO TABS: 800.0000 mg | ORAL_TABLET | Freq: Three times a day (TID) | ORAL | 0 refills | Status: DC | PRN

## 2024-09-12 ENCOUNTER — Encounter: Admitting: Internal Medicine

## 2024-09-12 ENCOUNTER — Other Ambulatory Visit: Payer: Self-pay

## 2024-09-12 DIAGNOSIS — G8929 Other chronic pain: Secondary | ICD-10-CM

## 2024-09-12 MED ORDER — OXYCODONE HCL 5 MG PO TABS: 5.0000 mg | ORAL_TABLET | Freq: Two times a day (BID) | ORAL | 0 refills | Status: DC | PRN

## 2024-09-16 ENCOUNTER — Ambulatory Visit

## 2024-09-16 NOTE — Therapy (Incomplete)
 OUTPATIENT PHYSICAL THERAPY CERVICAL and HIP TREATMENT   Patient Name: Nicole Walter MRN: [id number] DOB:[birth year], 63 y.o., female Today's Date: 09/16/2024  END OF SESSION:         Past Medical History:  Diagnosis Date   Abnormal liver enzymes 02/08/2016   Allergy    Anxiety    Chest pain 07/05/2016   Chronic pain    Depression    DJD (degenerative joint disease)    lumbar   Fibromyalgia    High cholesterol    Rash of back 01/28/2016   Weight loss due to medication    Past Surgical History:  Procedure Laterality Date   ANKLE SURGERY     x 2   BREAST BIOPSY Left 1997   CHOLECYSTECTOMY     COLONOSCOPY     COLONOSCOPY WITH PROPOFOL  N/A 02/22/2016   Procedure: COLONOSCOPY WITH PROPOFOL ;  Surgeon: Gladis RAYMOND Mariner, MD;  Location: [facility] ENDOSCOPY;  Service: Endoscopy;  Laterality: N/A;   ESOPHAGOGASTRODUODENOSCOPY (EGD) WITH PROPOFOL  N/A 02/22/2016   Procedure: ESOPHAGOGASTRODUODENOSCOPY (EGD) WITH PROPOFOL ;  Surgeon: Gladis RAYMOND Mariner, MD;  Location: [facility] ENDOSCOPY;  Service: Endoscopy;  Laterality: N/A;   NASAL SINUS SURGERY     SHOULDER ARTHROSCOPY WITH ROTATOR CUFF REPAIR AND SUBACROMIAL DECOMPRESSION Left 09/23/2019   Procedure: SHOULDER ARTHROSCOPY WITH MINI ROTATOR CUFF REPAIR AND SUBACROMIAL DECOMPRESSION, BICEPS TENODESIS;  Surgeon: Tobie Priest, MD;  Location: [facility];  Service: Orthopedics;  Laterality: Left;   TONSILLECTOMY     TUBAL LIGATION     Patient Active Problem List   Diagnosis Date Noted   Postmenopausal bleeding 07/29/2024   Dyspepsia 10/15/2023   Hot flash, menopausal 09/03/2023   Chronic bronchitis (HCC) 09/03/2023   Piriformis syndrome of right side 08/20/2023   Oral thrush 05/15/2023   Acute maxillary sinusitis 05/15/2023   Chronic radicular lumbar pain 03/20/2023   Exposure to sexually transmitted disease (STD) 01/25/2023   Acute vaginitis 01/25/2023   Abnormal cervical Papanicolaou smear 01/25/2023   Abnormal MRI, cervical  spine 07/22/2019   Neurogenic pain 07/22/2019   DDD (degenerative disc disease), cervical 06/09/2019   Chronic musculoskeletal pain 05/14/2019   Needle phobia 09/26/2018   Spondylosis without myelopathy or radiculopathy, lumbar region 08/29/2018   Lumbar facet hypertrophy 08/29/2018   Lumbar facet syndrome (Right) 08/29/2018   DDD (degenerative disc disease), thoracic 08/29/2018   Thoracic spondylosis with radiculopathy (Right) 08/29/2018   Spinal enthesopathy of lumbar region 08/29/2018   Chronic midline low back pain without sciatica 08/29/2018   Generalized anxiety disorder 07/23/2018   Cervical facet hypertrophy (Bilateral) 07/18/2018   Cervical central spinal stenosis 07/18/2018   Chronic shoulder pain (Left) 07/09/2018   Cervicalgia (Bilateral) (L>R) 03/28/2018   Spondylosis without myelopathy or radiculopathy, cervical region 02/11/2018   Chronic pelvic pain in female 08/24/2017   Fatty liver 08/24/2017   Chronic low back pain (Bilateral) (L>R) 08/13/2017   Foraminal stenosis of cervical region 08/13/2017   Chronic low back pain 08/13/2017   Cervical sensory radiculopathy at C5 (Bilateral) 08/13/2017   Cervical radicular pain 08/13/2017   Constipation 07/16/2017   Myofascial pain syndrome, cervical (trapezius) (Left) 05/28/2017   Cervical facet syndrome (Bilateral) (L>R) 05/28/2017   Muscle spasm 05/15/2017   Chronic hip pain (Right) 03/19/2017   Chronic knee arthropathy (Right) 03/14/2017   Osteoarthritis of knee (Right) 02/21/2017   Osteoarthritis of shoulder (Right) 01/31/2017   Shortness of breath 01/15/2017   Thoracic spondylosis 01/01/2017   Radicular pain of thoracic region 12/21/2016   Osteoarthritis of hip (Bilateral)  11/20/2016   Greater trochanteric bursitis (Right) 09/18/2016   Subacromial bursitis of shoulder joint (Right) 09/18/2016   Overweight (BMI 25.0-29.9) 09/14/2016   ESR raised 08/31/2016   Elevated C-reactive protein (CRP) 08/31/2016   Long term  (current) use of opiate analgesic 07/20/2016   Long term prescription opiate use 07/20/2016   Opiate use 07/20/2016   Chronic upper back pain (Secondary Area of Pain) (Bilateral) (L>R) 07/20/2016   Chronic shoulder pain (Tertiary Area of Pain) (Bilateral) (L>R) 07/20/2016   Chronic knee pain (Right) 07/20/2016   Chronic upper extremity pain (Left) 07/20/2016   Chronic lower extremity pain (Bilateral) (L>R) 07/20/2016   Chronic abdominal pain 07/20/2016   Chronic hip pain (Bilateral) (R>L) 05/31/2016   Chronic Greater trochanteric bursitis (Bilateral) (L>R) 05/31/2016   Bursitis of shoulder (Right) 05/17/2016   Skin lesions 04/27/2016   Hyperlipidemia 02/18/2016   Cervical spondylosis (Bilateral) 02/08/2016   Fibromyalgia 02/08/2016   Osteopenia 02/08/2016   Chronic neck pain (Primary Area of Pain) (Bilateral) (L>R) 01/28/2016   Concentration deficit 01/28/2016   Routine history and physical examination of adult 01/14/2016   Lumbar spondylosis 01/06/2016   Anxiety and depression 07/11/2015   Chronic pain syndrome 07/11/2015   Combined fat and carbohydrate induced hyperlipemia 07/11/2015    PCP: Dr. GORMAN Cinderella Perry  REFERRING PROVIDER: Dr. Wallie Sherry  REFERRING DIAG:  M54.12 (ICD-10-CM) - Cervical radicular pain  M54.2 (ICD-10-CM) - Cervicalgia  M16.11 (ICD-10-CM) - Primary osteoarthritis of right hip    THERAPY DIAG:  No diagnosis found.  Rationale for Evaluation and Treatment: Rehabilitation  ONSET DATE: 1990's per patient  SUBJECTIVE:                                                                                                                                                                                                         SUBJECTIVE STATEMENT: Patient reports busy weekend - still remodeling home - states neck is bothering her and right 3 fingers have been goind  Hand dominance: Right  PERTINENT HISTORY:  Patient has cervical and radicular pain into  shoulders as well as hip pain. Has received injections with minimal relief. Was referred by Dr. Sherry and dx of OA in hip and cervical radiculopathy with recent bout of diverticulitis.  PAIN:  Are you having pain? Yes: NPRS scale: 3-4/10 and down to 1-2/10 with oxycodone - 2x/day Pain location: R posterior/lateral Hip pain Pain description: achy, sharp/crampy at time/burning dull at times Aggravating factors: Prolonged walking, prolonged sitting, squatting Relieving factors: pain meds, ice  PRECAUTIONS: Fall  RED FLAGS: None  WEIGHT BEARING RESTRICTIONS: No  FALLS:  Has patient fallen in last 6 months? No  LIVING ENVIRONMENT: Lives with: lives alone Lives in: House/apartment Stairs: Yes: External: 4 steps; on right going up Has following equipment at home: None  OCCUPATION: Not currently-   PLOF: Independent  PATIENT GOALS: Being able to walk better without pain and sleep better.   NEXT MD VISIT: PCP 07/17/2024?  OBJECTIVE:  Note: Objective measures were completed at Evaluation unless otherwise noted.  DIAGNOSTIC FINDINGS:  CLINICAL DATA:  Back   EXAM: MRI CERVICAL, THORACIC AND LUMBAR SPINE WITHOUT CONTRAST   TECHNIQUE: Multiplanar and multiecho pulse sequences of the cervical spine, to include the craniocervical junction and cervicothoracic junction, and thoracic and lumbar spine, were obtained without intravenous contrast.   COMPARISON:  09/04/2017 MRI cervical spine, 07/11/2016 MRI thoracic spine and 03/02/2016 MRI lumbar spine   FINDINGS: MRI CERVICAL SPINE FINDINGS   Alignment: Straightening and mild reversal of the normal cervical lordosis. Trace anterolisthesis of C2 on C3, C3 on C4, and C4 on C5, which is new from the prior exam and favored to be facet mediated.   Vertebrae: No acute fracture, evidence of discitis, or suspicious osseous lesion.   Cord: Normal signal and morphology.   Posterior Fossa, vertebral arteries, paraspinal tissues:  Negative.   Disc levels:   C2-C3: No significant disc bulge. Left facet arthropathy. No spinal canal stenosis or neural foraminal narrowing.   C3-C4: Previously noted right paracentral disc protrusion is no longer seen. No significant disc bulge. Left-greater-than-right facet arthropathy. No spinal canal stenosis or neural foraminal narrowing.   C4-C5: Small central disc protrusion. Facet arthropathy. No spinal canal stenosis. Mild bilateral neural foraminal narrowing.   C5-C6: Mild disc bulge. Ligamentum flavum hypertrophy. Mild facet arthropathy. Mild-to-moderate spinal canal stenosis, which has progressed slightly from the prior exam. Moderate left and mild-to-moderate right neural foraminal narrowing, unchanged.   C6-C7: Mild disc bulge with increased size left paracentral disc extrusion, which indents the left aspect of the thecal sac and ventral spinal cord. Ligamentum flavum hypertrophy. Facet arthropathy. Moderate spinal canal stenosis, which has progressed from the prior exam, with unchanged mild bilateral neural foraminal narrowing.   C7-T1: No significant disc bulge. Left-greater-than-right facet arthropathy. No spinal canal stenosis. Mild left neural foraminal narrowing.   MRI THORACIC SPINE FINDINGS   Alignment: No listhesis. In mild levocurvature of the upper thoracic spine with dextrocurvature of the lower thoracic spine and thoracolumbar junction. Straightening of the normal thoracic kyphosis.   Vertebrae: No acute fracture, evidence of discitis, or suspicious osseous lesion.   Cord:  Normal signal and morphology.   Paraspinal and other soft tissues: Negative.   Disc levels:   T1-T2: Facet arthropathy. No spinal canal stenosis. Moderate to severe right and moderate left neural foraminal narrowing, unchanged.   T2-T3: Right eccentric disc bulge. Right greater than left facet arthropathy. No spinal canal stenosis. Severe right neural  foraminal narrowing, unchanged.   T3-T4: Right eccentric disc bulge. Right greater than facet arthropathy. No spinal canal stenosis. Moderate right neural foraminal narrowing.   T4-T5: Facet arthropathy. No spinal canal stenosis or neural foraminal narrowing.   T5-T6: Left paracentral disc protrusion. No spinal canal stenosis or neural foraminal narrowing.   T6-T7: Small central disc protrusion. No spinal canal stenosis or neural foraminal narrowing.   T7-T8: Small right subarticular disc protrusion. No spinal canal stenosis or neural foraminal narrowing.   T8-T9: Facet arthropathy. No spinal canal stenosis or neural foraminal narrowing.   T9-T10: Moderate  facet arthropathy. No spinal canal stenosis. Mild bilateral neural foraminal narrowing.   T10-T11: Moderate to severe facet arthropathy. No spinal canal stenosis. Mild bilateral neural foraminal narrowing.   T11-T12: Moderate facet arthropathy. No spinal canal stenosis. Mild left neural foraminal narrowing.   MRI LUMBAR SPINE FINDINGS   Segmentation:  5 lumbar type vertebral bodies.   Alignment: 5 mm anterolisthesis L4 on L5, which is new from the prior exam. Mild levocurvature.   Vertebrae: No acute fracture, evidence of discitis, or suspicious osseous lesion.   Conus medullaris and cauda equina: Conus extends to the L1 level. Conus and cauda equina appear normal.   Paraspinal and other soft tissues: Small renal cysts, for which no follow-up is currently indicated. No lymphadenopathy.   Disc levels:   T12-L1: No significant disc bulge. No spinal canal stenosis or neural foraminal narrowing.   L1-L2: No significant disc bulge. No spinal canal stenosis or neural foraminal narrowing.   L2-L3: Minimal disc bulge. No spinal canal stenosis or neural foraminal narrowing.   L3-L4: Mild disc bulge. Mild facet arthropathy. Narrowing of the lateral recesses. Mild spinal canal stenosis, which is new from the prior  exam. Mild left-greater-than-right neural foraminal narrowing, similar to the prior.   L4-L5: Grade 1 anterolisthesis with disc unroofing and moderate disc bulge. Severe facet arthropathy. Prominent epidural fat. Effacement of the lateral recesses and moderate to severe spinal canal stenosis, which are new from the prior exam. Mild bilateral neural foraminal narrowing, which has progressed on the left.   L5-S1: No significant disc bulge. No spinal canal stenosis or neural foraminal narrowing.   IMPRESSION: CERVICAL SPINE:   1. C6-C7 moderate spinal canal stenosis, which has progressed from the prior exam, with unchanged mild bilateral neural foraminal narrowing. 2. C5-C6 mild-to-moderate spinal canal stenosis, which has progressed slightly from the prior exam, with unchanged moderate left and mild-to-moderate right neural foraminal narrowing. 3. C4-C5 mild bilateral neural foraminal narrowing.   THORACIC SPINE:   Overall unchanged degenerative findings in the thoracic spine, without significant spinal canal stenosis.   LUMBAR SPINE:   1. L4-L5 moderate to severe spinal canal stenosis, which is new from the prior exam, with effacement of the lateral recesses, likely compressing the descending L5 nerve roots, and mild bilateral neural foraminal narrowing, which has progressed on the left. 2. L3-L4 mild spinal canal stenosis, which is new from the prior exam, with mild left-greater-than-right neural foraminal narrowing.     Electronically Signed   By: Donald Campion M.D.   On: 09/20/2023 20:41  PATIENT SURVEYS:  CLINICAL DATA:  Right hip pain and arthralgia.   EXAM: DG HIP (WITH OR WITHOUT PELVIS) 2-3V RIGHT   COMPARISON:  MRI right hip 02/07/2017   FINDINGS: No acute fracture or dislocation. Mild to moderate degenerative arthritis of both hips.   IMPRESSION: Mild to moderate degenerative arthritis of both hips.     Electronically Signed   By: Norman Gatlin  M.D.   On: 04/13/2024 23:52  COGNITION: Overall cognitive status: Within functional limits for tasks assessed  SENSATION: WFL  POSTURE: rounded shoulders, forward head, and weight shift left   Strength R/L *5/5 Hip flexion *5/5 Hip external rotation *5/5 Hip internal rotation 5/5 Hip extension  *4+/4+ Hip abduction 5/5 Hip adduction 5/5 Knee extension 5/5 Knee flexion 5/5 Ankle Dorsiflexion 5/5 Ankle Plantarflexion 5/5 Ankle Inversion 5/5 Ankle Eversion *indicates pain    Muscle Length Hamstring length (degrees): R/L: NT Quad length Arvid): NT Hip Flexor length Cathy): R/L: +  Right IT band length Everlene): -   CERVICAL ROM:   Active ROM A/PROM (deg) eval  Flexion 18  Extension 32  Right lateral flexion 32  Left lateral flexion 31  Right rotation 72  Left rotation 59   (Blank rows = not tested)   PALPATION: (+) Tenderness just posterior and later to RSIJ, some tenderness in gluteal/piriformis, Gluteal med and TFL and Right hip flexor   FUNCTIONAL TESTS:  5 times sit to stand: 12.44 10 meter walk test: 12.25 sec or 0.82 m/s without AD  GAIT: Distance walked: 100 feet Assistive device utilized: None Level of assistance: Complete Independence Comments: antalgic on Right    TREATMENT DATE: 09/16/24   Manual therapy:  -Suboccipital release- Hold 60 sec x 4 -STM to cervical paraspinals and R sided Upper traps x 15 -IASTM- using the Stick to right UT region x 4 min  NMR:  -Standing cervical Retraction at wall (hold 5 sec x 10)  -Standing Scap retraction x 10  -Standing at wall- wall angels x 10 reps -Facing wall- Y off wall x 10 reps   Therex: PROM to cervical SB and Rot AROM for R UT - 30 sec hold x 2 today  Self care/home management -added some of above activities to home program.   PATIENT EDUCATION:  Education details: Advanced HEP; Discussed role of muscle in posture Person educated: Patient Education method: Explanation,  Demonstration, Tactile cues, Verbal cues, and Handouts Education comprehension: verbalized understanding, returned demonstration, verbal cues required, tactile cues required, and needs further education  HOME EXERCISE PROGRAM: Access Code: [email or web address] URL: [email or web address] Date: 08/18/2024 Prepared by: Reyes London  Exercises - Seated Scapular Retraction  - 3 x weekly - 3 sets - 10 reps - Seated Cervical Retraction  - 3 x weekly - 3 sets - 10 reps - Seated Assisted Cervical Rotation with Towel  - 1 x daily - 3 sets - 30 sec hold - Standing Shoulder Horizontal Abduction with Resistance  - 3 x weekly - 3 sets - 10 reps - Seated Upper Trapezius Stretch  - 1 x daily - 3 sets - 30 hold - Wall Angels  - 3 x weekly - 3 sets - 10 reps - Low Trap Setting at Wall  - 3 x weekly - 3 sets - 10 reps       Access Code: [email or web address] URL: [email or web address] Date: 07/24/2024 Prepared by: Reyes London  Exercises - Standing Shoulder Row with Anchored Resistance  - 3 x weekly - 3 sets - 10 reps - Shoulder extension with resistance - Neutral  - 3 x weekly - 3 sets - 10 reps - Standing Shoulder Horizontal Abduction with Resistance  - 3 x weekly - 3 sets - 10 reps     Access Code: [email or web address] URL: [email or web address] Date: 07/15/2024 Prepared by: Reyes London  Exercises - Seated Scapular Retraction  - 3 x weekly - 3 sets - 10 reps - Seated Cervical Retraction  - 3 x weekly - 3 sets - 10 reps - Seated Assisted Cervical Rotation with Towel  - 1 x daily - 3 sets - 30 sec hold      Access Code: [email or web address] URL: [email or web address] Date: 07/04/2024 Prepared by: Reyes London & Updated Sidra Simpers  Exercises - Supine Hip Internal and External Rotation  - 1 x daily - 3 sets - 10 reps - Supine Lower Trunk Rotation  - 1 x daily - 3 sets - 10 reps - Hip Flexor Stretch at [facility] of Bed  -  1 x daily - 3 sets - 30  hold - Supine Bridge  - 1 x daily - 7 x weekly - 3 sets - 10 reps - Clamshell  - 1 x daily - 7 x weekly - 3 sets - 10 reps - Sidelying Reverse Clamshell  - 1 x daily - 7 x weekly - 3 sets - 10 reps  ASSESSMENT:  CLINICAL IMPRESSION: Treatment focused more on cervical issues this am. Patient responded with improved report of pain. Stated now more aware of her posture and how stress can affect her postural muscles. She was able to return demonstration for all activities today without report of any worsening numbness into right digits. She will benefit from review next session.  Pt will continue to benefit from skilled therapy to address remaining deficits in order to improve overall QoL and return to PLOF.     OBJECTIVE IMPAIRMENTS: Abnormal gait, decreased activity tolerance, decreased balance, decreased endurance, decreased mobility, difficulty walking, decreased ROM, hypomobility, impaired flexibility, postural dysfunction, and pain.   ACTIVITY LIMITATIONS: carrying, lifting, bending, sitting, standing, squatting, sleeping, stairs, transfers, and bed mobility  PARTICIPATION LIMITATIONS: meal prep, cleaning, laundry, driving, shopping, community activity, and yard work  PERSONAL FACTORS: Past/current experiences, Time since onset of injury/illness/exacerbation, and 3+ comorbidities: chronic pain syndrome, Lumbar and cervical spondylosis, anxiety are also affecting patient's functional outcome.   REHAB POTENTIAL: Good  CLINICAL DECISION MAKING: Evolving/moderate complexity  EVALUATION COMPLEXITY: Moderate   GOALS: Goals reviewed with patient? Yes  SHORT TERM GOALS: Target date: 08/15/2024  Pt will be independent with HEP in order to decrease ankle pain and increase strength in order to improve pain-free function at home. Baseline: EVAL- No formal HEP in place Goal status: INITIAL   LONG TERM GOALS: Target date: 09/26/2024  Pt will increase LEFS by at least 9 points in order to  demonstrate significant improvement in lower extremity function.  Baseline: EVAL= 36/80 Goal status: INITIAL  2.  Pt will decrease worst pain as reported on NPRS by at least 2 points in order to demonstrate clinically significant reduction in R hip pain.  Baseline: EVAL= 4/10 right hip pain Goal status: INITIAL  3.  Pt will demonstrate improved gait speed > 1.0 m/s normal speed for improved functional mobility in community. Baseline: EVAL =0.82 m/s Goal status: INITIAL  4.  Pt will demonstrate increased active cervical ROM to at least 28 flexion, 42 extension, and 69 left rotation to improve ability to perform ADLs such as driving and head turning with reduced restriction.  Baseline: Flexion: 18; Extension: 32 deg; L Rotation: 59 deg    PLAN:  PT FREQUENCY: 1-2x/week  PT DURATION: 12 weeks  PLANNED INTERVENTIONS: [id number]- PT Re-evaluation, 97750- Physical Performance Testing, 97110-Therapeutic exercises, 97530- Therapeutic activity, [id number]- Neuromuscular re-education, 97535- Self Care, [id number]- Manual therapy, [id number]- Gait training, [id number]- Orthotic Initial, [id number]- Orthotic/Prosthetic subsequent, [phone]- Canalith repositioning, [id number]- Electrical stimulation (manual), [id number]- Traction (mechanical), 20560 (1-2 muscles), 20561 (3+ muscles)- Dry Needling, Patient/Family education, Balance training, Stair training, Taping, Joint mobilization, Joint manipulation, Spinal manipulation, Spinal mobilization, Vestibular training, Cryotherapy, and Moist heat  PLAN FOR NEXT SESSION:  Core stabilization  Manual therapy for C-spine and hip Progress postural activities  Continue with hip ROM/pain relieving techniques/modalities Progress HEP as appropriate.    Chyrl London, PT  Physical Therapist - [facility]  09/16/24, 9:03 AM

## 2024-09-25 ENCOUNTER — Other Ambulatory Visit

## 2024-09-25 DIAGNOSIS — R7301 Impaired fasting glucose: Secondary | ICD-10-CM

## 2024-09-25 DIAGNOSIS — E782 Mixed hyperlipidemia: Secondary | ICD-10-CM

## 2024-09-25 DIAGNOSIS — E663 Overweight: Secondary | ICD-10-CM

## 2024-09-25 DIAGNOSIS — I10 Essential (primary) hypertension: Secondary | ICD-10-CM

## 2024-09-26 LAB — CMP14+EGFR
ALT: 14 IU/L (ref 0–32)
AST: 15 IU/L (ref 0–40)
Albumin: 4.2 g/dL (ref 3.9–4.9)
Alkaline Phosphatase: 85 IU/L (ref 49–135)
BUN/Creatinine Ratio: 19 (ref 12–28)
BUN: 18 mg/dL (ref 8–27)
Bilirubin Total: 0.4 mg/dL (ref 0.0–1.2)
CO2: 23 mmol/L (ref 20–29)
Calcium: 9.3 mg/dL (ref 8.7–10.3)
Chloride: 104 mmol/L (ref 96–106)
Creatinine, Ser: 0.95 mg/dL (ref 0.57–1.00)
Globulin, Total: 1.9 g/dL (ref 1.5–4.5)
Glucose: 90 mg/dL (ref 70–99)
Potassium: 4.7 mmol/L (ref 3.5–5.2)
Sodium: 143 mmol/L (ref 134–144)
Total Protein: 6.1 g/dL (ref 6.0–8.5)
eGFR: 68 mL/min/1.73 (ref >59)

## 2024-09-26 LAB — LIPID PANEL
Chol/HDL Ratio: 2.4 ratio (ref 0.0–4.4)
Cholesterol, Total: 182 mg/dL (ref 100–199)
HDL: 75 mg/dL (ref >39)
LDL Chol Calc (NIH): 96 mg/dL (ref 0–99)
Triglycerides: 60 mg/dL (ref 0–149)
VLDL Cholesterol Cal: 11 mg/dL (ref 5–40)

## 2024-09-26 LAB — HEMOGLOBIN A1C
Est. average glucose Bld gHb Est-mCnc: 108 mg/dL
Hgb A1c MFr Bld: 5.4 % (ref 4.8–5.6)

## 2024-09-26 LAB — TSH: TSH: 0.88 u[IU]/mL (ref 0.450–4.500)

## 2024-09-30 ENCOUNTER — Ambulatory Visit: Admitting: Internal Medicine

## 2024-09-30 ENCOUNTER — Encounter: Payer: Self-pay | Admitting: Nurse Practitioner

## 2024-09-30 ENCOUNTER — Encounter: Payer: Self-pay | Admitting: Internal Medicine

## 2024-09-30 ENCOUNTER — Ambulatory Visit: Payer: Self-pay | Admitting: Internal Medicine

## 2024-09-30 ENCOUNTER — Ambulatory Visit: Attending: Nurse Practitioner | Admitting: Nurse Practitioner

## 2024-09-30 VITALS — BP 139/75 | HR 69 | Temp 97.0°F | Resp 18 | Ht 64.0 in | Wt 154.0 lb

## 2024-09-30 VITALS — BP 121/78 | HR 67 | Temp 97.9°F | Ht 64.0 in | Wt 152.8 lb

## 2024-09-30 DIAGNOSIS — M1611 Unilateral primary osteoarthritis, right hip: Secondary | ICD-10-CM | POA: Insufficient documentation

## 2024-09-30 DIAGNOSIS — R7301 Impaired fasting glucose: Secondary | ICD-10-CM

## 2024-09-30 DIAGNOSIS — E663 Overweight: Secondary | ICD-10-CM

## 2024-09-30 DIAGNOSIS — M797 Fibromyalgia: Secondary | ICD-10-CM

## 2024-09-30 DIAGNOSIS — M47894 Other spondylosis, thoracic region: Secondary | ICD-10-CM | POA: Insufficient documentation

## 2024-09-30 DIAGNOSIS — I10 Essential (primary) hypertension: Secondary | ICD-10-CM | POA: Diagnosis not present

## 2024-09-30 DIAGNOSIS — G8929 Other chronic pain: Secondary | ICD-10-CM | POA: Diagnosis present

## 2024-09-30 DIAGNOSIS — M19011 Primary osteoarthritis, right shoulder: Secondary | ICD-10-CM | POA: Insufficient documentation

## 2024-09-30 DIAGNOSIS — M25551 Pain in right hip: Secondary | ICD-10-CM | POA: Diagnosis present

## 2024-09-30 DIAGNOSIS — M5416 Radiculopathy, lumbar region: Secondary | ICD-10-CM | POA: Insufficient documentation

## 2024-09-30 DIAGNOSIS — F32A Depression, unspecified: Secondary | ICD-10-CM

## 2024-09-30 DIAGNOSIS — G894 Chronic pain syndrome: Secondary | ICD-10-CM | POA: Insufficient documentation

## 2024-09-30 DIAGNOSIS — E782 Mixed hyperlipidemia: Secondary | ICD-10-CM | POA: Diagnosis not present

## 2024-09-30 DIAGNOSIS — M542 Cervicalgia: Secondary | ICD-10-CM | POA: Diagnosis present

## 2024-09-30 DIAGNOSIS — F331 Major depressive disorder, recurrent, moderate: Secondary | ICD-10-CM

## 2024-09-30 DIAGNOSIS — M5412 Radiculopathy, cervical region: Secondary | ICD-10-CM | POA: Insufficient documentation

## 2024-09-30 DIAGNOSIS — M5414 Radiculopathy, thoracic region: Secondary | ICD-10-CM | POA: Diagnosis not present

## 2024-09-30 DIAGNOSIS — M5134 Other intervertebral disc degeneration, thoracic region: Secondary | ICD-10-CM | POA: Diagnosis present

## 2024-09-30 DIAGNOSIS — K760 Fatty (change of) liver, not elsewhere classified: Secondary | ICD-10-CM

## 2024-09-30 DIAGNOSIS — F419 Anxiety disorder, unspecified: Secondary | ICD-10-CM

## 2024-09-30 MED ORDER — FENOFIBRATE 160 MG PO TABS: 160.0000 mg | ORAL_TABLET | Freq: Every day | ORAL | 0 refills | Status: AC

## 2024-09-30 NOTE — Progress Notes (Signed)
 PROVIDER NOTE: Interpretation of information contained herein should be left to medically-trained personnel. Specific patient instructions are provided elsewhere under Patient Instructions section of medical record. This document was created in part using AI and STT-dictation technology, any transcriptional errors that may result from this process are unintentional.  Patient: Nicole Walter  Service: E/M   PCP: Albina GORMAN Dine, MD  DOB: [birth year]  DOS: 09/30/2024  Provider: Emmy MARLA Blanch, NP  MRN: [id number]  Delivery: Face-to-face  Specialty: Interventional Pain Management  Type: Established Patient  Setting: Ambulatory outpatient facility  Specialty designation: 09  Referring Prov.: Albina GORMAN Dine, MD  Location: Outpatient office facility       History of present illness (HPI) [name], a 63 y.o. year old female, is here today because of her Primary osteoarthritis of right hip [M16.11]. [name] primary complain today is Neck Pain  Pertinent problems: Ms. Haefner has Chronic pain syndrome; Lumbar spondylosis; Chronic neck pain (Primary Area of Pain) (Bilateral) (L>R); Cervical spondylosis (Bilateral); Fibromyalgia; Chronic hip pain (Bilateral) (R>L); Chronic Greater trochanteric bursitis (Bilateral) (L>R); Long term prescription opiate use; Opiate use; Chronic upper back pain (Secondary Area of Pain) (Bilateral) (L>R); Chronic shoulder pain (Tertiary Area of Pain) (Bilateral) (L>R); Chronic knee pain (Right); Chronic upper extremity pain (Left); Chronic lower extremity pain (Bilateral) (L>R); Osteoarthritis of hip (Bilateral); Thoracic spondylosis; Osteoarthritis of shoulder (Right); Osteoarthritis of knee (Right); Chronic knee arthropathy (Right); Chronic hip pain (Right); Myofascial pain syndrome, cervical (trapezius) (Left); Cervical facet syndrome (Bilateral) (L>R); Chronic low back pain (Bilateral) (L>R); Cervical sensory radiculopathy at C5 (Bilateral); Spondylosis without  myelopathy or radiculopathy, cervical region; Cervicalgia (Bilateral) (L>R); and Chronic shoulder pain (Left) on their pertinent problem list.  Pain Assessment: Severity of Chronic pain is reported as a 2 /10. Location: Generalized Other (Comment) (Generalized)/Denies, Having generalized pain. Onset: More than a month ago. Quality: Burning, Aching, Cramping. Timing: Constant. Modifying factor(s): Pain medication. Vitals:  height is 5' 4 (1.626 m) and weight is 154 lb (69.9 kg). Her temporal temperature is 97 F (36.1 C) (abnormal). Her blood pressure is 139/75 and her pulse is 69. Her respiration is 18 and oxygen saturation is 99%.  BMI: Estimated body mass index is 26.43 kg/m as calculated from the following:   Height as of this encounter: 5' 4 (1.626 m).   Weight as of this encounter: 154 lb (69.9 kg).  Last encounter: Visit date not found. Last procedure: Visit date not found.  Reason for encounter: evaluation of worsening, or previously known (established) problem.   Discussed the use of AI scribe software for clinical note transcription with the patient, who gave verbal consent to proceed.  History of Present Illness   Nicole Walter is a 63 year old female with fibromyalgia and degenerative arthritis who presents with worsening joint and muscle pain.   She experiences worsening pain in her wrists, knees, ankles, hips, shoulders, and neck likely due to fibromyalgia. The ankle pain is localized to the joint, while the hip and shoulder pain is longstanding. The neck and shoulder pain was previously exacerbated by statin use, which she discontinued due to severe discomfort. Significant pain is noted on the right side, especially in the shoulder where she has had previous surgery involving pins and screws.  She reports that an x-ray was performed on her hips and she was told it showed mild to moderate degenerative arthritis. She has received hip injections in the past, which provided some  relief, but she experiences sciatic nerve pain when  pressure is applied to certain areas. She has also had cervical spine issues, receiving epidural and trigger point injections without significant relief.  She has a history of bursitis in the shoulder blade and has undergone physical therapy, which she discontinued due to worsening symptoms. She has had multiple injections for pain management but wishes to avoid further injections due to lack of efficacy and potential degeneration.  She is currently taking oxycodone  5 mg twice daily for pain management from Primary care provider (PCP), which she finds insufficient. She has previously used tramadol , which was ineffective and caused mood changes. She has a history of using Valium  for muscle-related pain, which she found helpful. She is interested in exploring alternative pain management options.  She has a history of canal stenosis and reports severe pain in the thoracic region, described as 'unbearable' at times. Stretching does not alleviate the pain, and she experiences muscle spasms, particularly at the bra line, which have persisted since a flare-up two years ago.     Pharmacotherapy Assessment   Monitoring: [place] PMP: PDMP reviewed during this encounter.       Pharmacotherapy: No side-effects or adverse reactions reported. Compliance: No problems identified. Effectiveness: Clinically acceptable.  No notes on file  UDS:  Summary  Date Value Ref Range Status  08/19/2018 FINAL  Final    Comment:    ==================================================================== TOXASSURE SELECT 13 (MW) ==================================================================== Test                             Result       Flag       Units Drug Present and Declared for Prescription Verification   Lorazepam                       1903         EXPECTED   ng/mg creat    Source of lorazepam  is a scheduled prescription medication.   Oxycodone                        127          EXPECTED   ng/mg creat   Oxymorphone                    395          EXPECTED   ng/mg creat   Noroxycodone                   1203         EXPECTED   ng/mg creat   Noroxymorphone                 265          EXPECTED   ng/mg creat    Sources of oxycodone  are scheduled prescription medications.    Oxymorphone, noroxycodone, and noroxymorphone are expected    metabolites of oxycodone . Oxymorphone is also available as a    scheduled prescription medication. ==================================================================== Test                      Result    Flag   Units      Ref Range   Creatinine              98               mg/dL      >=79 ==================================================================== Declared  Medications:  The flagging and interpretation on this report are based on the  following declared medications.  Unexpected results may arise from  inaccuracies in the declared medications.  **Note: The testing scope of this panel includes these medications:  Lorazepam  (Ativan )  Oxycodone  (Roxicodone )  **Note: The testing scope of this panel does not include following  reported medications:  Fluticasone (Flonase)  Gabapentin   Ibuprofen   Linaclotide  (Linzess )  Multivitamin  Naloxone  (Narcan )  Omega-3 Fatty Acids (Fish Oil)  Rabeprazole (Aciphex)  Sucralfate (Carafate)  Topical (Lidex )  Vortioxetine  (Trintellix ) ==================================================================== For clinical consultation, please call [phone]. ====================================================================     No results found for: CBDTHCR No results found for: D8THCCBX No results found for: D9THCCBX  ROS  Constitutional: Denies any fever or chills Gastrointestinal: No reported hemesis, hematochezia, vomiting, or acute GI distress Musculoskeletal: Right hip pain, right shoulder pain, thoracic pain (right side) Neurological: No reported episodes  of acute onset apraxia, aphasia, dysarthria, agnosia, amnesia, paralysis, loss of coordination, or loss of consciousness  Medication Review  Azelastine  HCl, Calcium Carb-Cholecalciferol, LORazepam , Multi-Vitamins, Vilazodone HCl, cetirizine , ezetimibe , fenofibrate , ibuprofen , linaclotide , mupirocin ointment, naloxone , nicotine , omeprazole, ondansetron , and oxyCODONE   History Review  Allergy: Ms. Luscher is allergic to other, adhesive [tape], food, statins, and pea. Drug: Ms. Krusemark  reports no history of drug use. Alcohol:  reports no history of alcohol use. Tobacco:  reports that she quit smoking about 9 years ago. Her smoking use included e-cigarettes and cigarettes. She started smoking about 50 years ago. She has a 41.3 pack-year smoking history. She has never used smokeless tobacco. Social: Ms. Stuckey  reports that she quit smoking about 9 years ago. Her smoking use included e-cigarettes and cigarettes. She started smoking about 50 years ago. She has a 41.3 pack-year smoking history. She has never used smokeless tobacco. She reports that she does not drink alcohol and does not use drugs. Medical:  has a past medical history of Abnormal liver enzymes (02/08/2016), Allergy, Anxiety, Chest pain (07/05/2016), Chronic pain, Depression, DJD (degenerative joint disease), Fibromyalgia, High cholesterol, Rash of back (01/28/2016), and Weight loss due to medication. Surgical: Ms. Bertucci  has a past surgical history that includes Cholecystectomy; Tubal ligation; Ankle surgery; Nasal sinus surgery; Tonsillectomy; Colonoscopy; Esophagogastroduodenoscopy (egd) with propofol  (N/A, 02/22/2016); Colonoscopy with propofol  (N/A, 02/22/2016); Breast biopsy (Left, 1997); and Shoulder arthroscopy with rotator cuff repair and subacromial decompression (Left, 09/23/2019). Family: family history includes Arthritis in her father, paternal grandfather, and paternal grandmother; Cancer in her brother and father; Dementia in her sister;  Diabetes in her sister; Drug abuse in her brother, brother, and sister; Heart disease in her mother; Post-traumatic stress disorder in her brother; Stroke in her mother.  Laboratory Chemistry Profile   Renal Lab Results  Component Value Date   BUN 18 09/25/2024   CREATININE 0.95 09/25/2024   BCR 19 09/25/2024   GFR 86.13 10/08/2018   GFRAA >60 12/07/2018   GFRNONAA >60 05/09/2024    Hepatic Lab Results  Component Value Date   AST 15 09/25/2024   ALT 14 09/25/2024   ALBUMIN 4.2 09/25/2024   ALKPHOS 85 09/25/2024   HCVAB NEGATIVE 02/14/2017   LIPASE 36 05/09/2024    Electrolytes Lab Results  Component Value Date   NA 143 09/25/2024   K 4.7 09/25/2024   CL 104 09/25/2024   CALCIUM 9.3 09/25/2024   MG 2.2 05/15/2017    Bone Lab Results  Component Value Date   VD25OH 49.0 12/18/2023   25OHVITD1 58 07/20/2016  25OHVITD2 <1.0 07/20/2016   25OHVITD3 58 07/20/2016    Inflammation (CRP: Acute Phase) (ESR: Chronic Phase) Lab Results  Component Value Date   CRP 2.1 (H) 07/20/2016   ESRSEDRATE 2 05/15/2017         Note: Above Lab results reviewed.  Recent Imaging Review  US  PELVIC COMPLETE WITH TRANSVAGINAL CLINICAL DATA:  Vaginal bleeding  EXAM: TRANSABDOMINAL AND TRANSVAGINAL ULTRASOUND OF PELVIS  TECHNIQUE: Both transabdominal and transvaginal ultrasound examinations of the pelvis were performed. Transabdominal technique was performed for global imaging of the pelvis including uterus, ovaries, adnexal regions, and pelvic cul-de-sac. It was necessary to proceed with endovaginal exam following the transabdominal exam to visualize the uterus and adnexae in better detail.  COMPARISON:  None Available.  FINDINGS: Uterus  Measurements: 5.7 x 3.0 x 3.6 cm = volume: 32 mL. No fibroids or other mass visualized.  Endometrium  Thickness: 2 mm. Trace fluid within the endometrial cavity, nonspecific. Similar pattern of subcentimeter echogenic shadowing foci along  the posterior endometrium/myometrial interface compared to 2018 favored to be scattered benign calcifications as before.  Right ovary  Measurements: 1.1 x 0.6 x 1.1 cm = volume: 0.4 mL. Normal appearance/no adnexal mass.  Left ovary  Not visualized  Other findings  No abnormal free fluid.  IMPRESSION: 1. Trace fluid within the endometrial cavity, nonspecific. 2. Normal endometrial thickness. 3. Nonvisualization of the left ovary. 4. No other acute finding by pelvic ultrasound.  Electronically Signed   By: CHRISTELLA.  Shick M.D.   On: 07/05/2024 14:38 Note: Reviewed        Physical Exam  Vitals: BP 139/75 (BP Location: Right Arm, Patient Position: Sitting, Cuff Size: Normal)   Pulse 69   Temp (!) 97 F (36.1 C) (Temporal)   Resp 18   Ht 5' 4 (1.626 m)   Wt 154 lb (69.9 kg)   SpO2 99%   BMI 26.43 kg/m  BMI: Estimated body mass index is 26.43 kg/m as calculated from the following:   Height as of this encounter: 5' 4 (1.626 m).   Weight as of this encounter: 154 lb (69.9 kg). Ideal: Ideal body weight: 54.7 kg (120 lb 9.5 oz) Adjusted ideal body weight: 60.8 kg (133 lb 15.3 oz) General appearance: Well nourished, well developed, and well hydrated. In no apparent acute distress Mental status: Alert, oriented x 3 (person, place, & time)       Respiratory: No evidence of acute respiratory distress Eyes: PERLA  Musculoskeletal: Right shoulder pain Right hip pain  Midthoracic pain (right)  Cervical Spine Area Exam  Skin & Axial Inspection: No masses, redness, edema, swelling, or associated skin lesions Alignment: Symmetrical Functional ROM: Pain restricted ROM, bilaterally Stability: No instability detected Muscle Tone/Strength: Functionally intact. No obvious neuro-muscular anomalies detected. Sensory (Neurological): Dermatomal pain pattern Palpation: No palpable anomalies             Upper Extremity (UE) Exam      Right  Left  Inspection    Skin color, temperature,  and hair growth are WNL. No peripheral edema or cyanosis. No masses, redness, swelling, asymmetry, or associated skin lesions. No contractures.  Skin color, temperature, and hair growth are WNL. No peripheral edema or cyanosis. No masses, redness, swelling, asymmetry, or associated skin lesions. No contractures.          Functional ROM    Decreased ROM for shoulder  Decreased ROM for shoulder          Muscle Tone/Strength    Functionally  intact. No obvious neuro-muscular anomalies detected.  Functionally intact. No obvious neuro-muscular anomalies detected.          Sensory (Neurological)    Musculoskeletal pain pattern affecting the shoulder  Musculoskeletal pain pattern affecting the shoulder          Palpation    No palpable anomalies              No palpable anomalies                      Maneuver Shoulder abduction (deltoid/supraspinatus, axillary/supra scapular n,, C5) Elbow flexion (biceps brachial, musculoskeletal n, C5-6) Elbow extension (triceps, radial n, C7) Finger abduction (interossei, ulnar n, T1)    Shoulder abduction (deltoid/supraspinatus, axillary/supra scapular n,, C5) Elbow flexion (biceps brachial, musculoskeletal n, C5-6) Elbow extension (triceps, radial n, C7) Wrist extensors (C6) Finger extensors (C8) Finger abduction (interossei, ulnar n, T1)            Provocative Test    Phalen's test: deferred Tinel's test: deferred Apley's scratch test (touch opposite shoulder):  Action 1 (Across chest): Decreased ROM Action 2 (Overhead): Decreased ROM Action 3 (LB reach): Decreased ROM  Phalen's test: deferred Tinel's test: deferred Apley's scratch test (touch opposite shoulder):  Action 1 (Across chest): Improved ROM Action 2 (Overhead): Decreased ROM Action 3 (LB reach): Decreased ROM             Level  Myotome  Dermatome  Sclerotome  ROM  C5  Elbow flexion  Lateral upper arm      C6  Wrist extension  Thumb and index      C7  Elbow extension  Middle finger       C8  Finger extension  Ring and pinky finger      T1  Finger abduction  Medial elbow and axilla                                                                                                                                         Thoracic Spine Area Exam  Skin & Axial Inspection: No masses, redness, or swelling Alignment: Symmetrical Functional ROM: Pain restricted ROM Stability: No instability detected Muscle Tone/Strength: Functionally intact. No obvious neuro-muscular anomalies detected. Sensory (Neurological): Neurogenic pain pattern Muscle strength & Tone: No palpable anomalies Lumbar Spine Area Exam  Skin & Axial Inspection: No masses, redness, or swelling Alignment: Symmetrical Functional ROM: Pain restricted ROM affecting both sides Stability: No instability detected Muscle Tone/Strength: Functionally intact. No obvious neuro-muscular anomalies detected. Sensory (Neurological): Dermatomal pain pattern Palpation: No palpable anomalies       Provocative Tests: Hyperextension/rotation test: (+) bilaterally for facet joint pain. Lumbar quadrant test (Kemp's test): (+) bilateral for foraminal stenosis Lateral bending test: (+) due to pain.   Gait & Posture Assessment  Ambulation: Unassisted Gait: Relatively normal for age and body habitus Posture: WNL  Lower Extremity  Exam      Side: Right lower extremity   Side: Left lower extremity  Stability: No instability observed           Stability: No instability observed          Skin & Extremity Inspection: Skin color, temperature, and hair growth are WNL. No peripheral edema or cyanosis. No masses, redness, swelling, asymmetry, or associated skin lesions. No contractures.   Skin & Extremity Inspection: Skin color, temperature, and hair growth are WNL. No peripheral edema or cyanosis. No masses, redness, swelling, asymmetry, or associated skin lesions. No contractures.  Functional ROM: Unrestricted ROM                    Functional ROM: Unrestricted ROM                  Muscle Tone/Strength: Functionally intact. No obvious neuro-muscular anomalies detected.   Muscle Tone/Strength: Functionally intact. No obvious neuro-muscular anomalies detected.  Sensory (Neurological): Unimpaired         Sensory (Neurological): Unimpaired        DTR: Patellar: deferred today Achilles: deferred today Plantar: deferred today   DTR: Patellar: deferred today Achilles: deferred today Plantar: deferred today  Palpation: No palpable anomalies   Palpation: No palpable anomalies   Assessment   Diagnosis Status  1. Primary osteoarthritis of right hip   2. Radicular pain of thoracic region   3. Osteoarthritis of shoulder (Right)   4. Cervical radicular pain   5. Cervicalgia   6. Chronic radicular lumbar pain   7. Chronic pain syndrome   8. Chronic right hip pain   9. Other intervertebral disc degeneration, thoracic region   10. Thoracic spondylosis    Controlled Controlled Controlled   Updated Problems: Problem  Other Intervertebral Disc Degeneration, Thoracic Region  Primary Osteoarthritis of Right Hip    Plan of Care  Problem-specific:  Assessment and Plan    Right hip osteoarthritis with pain Mild to moderate degenerative arthritis confirmed by x-ray. Previous injections provided limited relief. Pain exacerbated by sciatic nerve pressure. - Ordered MRI of right hip. - Consider epidural injection if symptoms persist. - Discuss physical therapy options.  Right shoulder osteoarthritis with pain Pain may be related to degenerative changes or previous injury. - Ordered MRI of right shoulder.  Cervical radiculopathy with neck pain Chronic pain persists, possibly related to cervical spine issues. - Consider epidural injection if symptoms persist. - Discuss physical therapy options.  Thoracic spine pain and degeneration Chronic pain with canal stenosis, possibly related to fibromyalgia. - Ordered MRI of  thoracic spine.  Lumbar radiculopathy with chronic pain syndrome Chronic radiculopathy with previous sciatic nerve involvement. Current oxycodone  dosage insufficient. - Continue current oxycodone  regimen. - Evaluate MRI results for further management options.  Fibromyalgia Chronic fibromyalgia contributing to widespread pain.  Multifocal joint pain (wrists, knees, ankles) New onset pain possibly related to medication or fibromyalgia flare-up.       Ms. Legacy Carrender has a current medication list which includes the following long-term medication(s): azelastine  hcl, calcium carb-cholecalciferol, cetirizine , ezetimibe , fenofibrate , linzess , omeprazole, and vilazodone hcl.  Pharmacotherapy (Medications Ordered): No orders of the defined types were placed in this encounter.  Orders:  Orders Placed This Encounter  Procedures   MR SHOULDER RIGHT WO CONTRAST    Standing Status:   Future    Expiration Date:   12/31/2024    Scheduling Instructions:     Please make sure that the patient understands that this  needs to be done as soon as possible. Never have the patient do the imaging just before the next appointment. Inform patient that having the imaging done within the [facility] will expedite the availability of the results and will provide      imaging availability to the requesting physician. In addition inform the patient that the imaging order has an expiration date and will not be renewed if not done within the active period.    What is the patient's sedation requirement?:   No Sedation    Does the patient have a pacemaker or implanted devices?:   No    Preferred imaging location?:   [facility] (table limit-350lbs)    Call Results- Best Contact Number?:   [phone] [place] [facility]    Radiology Contrast Protocol - do NOT remove file path:   [email or web address]   MR HIP RIGHT WO CONTRAST     Standing Status:   Future    Expiration Date:   12/31/2024    Scheduling Instructions:     Please make sure that the patient understands that this needs to be done as soon as possible. Never have the patient do the imaging just before the next appointment. Inform patient that having the imaging done within the [facility] will expedite the availability of the results and will provide      imaging availability to the requesting physician. In addition inform the patient that the imaging order has an expiration date and will not be renewed if not done within the active period.    What is the patient's sedation requirement?:   No Sedation    Does the patient have a pacemaker or implanted devices?:   No    Preferred imaging location?:   [facility] (table limit-350lbs)    Call Results- Best Contact Number?:   [phone] [place] [facility]    Radiology Contrast Protocol - do NOT remove file path:   [email or web address]   MR THORACIC SPINE WO CONTRAST    Patient presents with axial pain with possible radicular component. Please assist us  in identifying specific level(s) and laterality of any additional findings such as: 1. Facet (Zygapophyseal) joint DJD (Hypertrophy, space narrowing, subchondral sclerosis, and/or osteophyte formation) 2. DDD and/or IVDD (Loss of disc height, desiccation, gas patterns, osteophytes, endplate sclerosis, or Black disc disease) 3. Pars defects 4. Spondylolisthesis, spondylosis, and/or spondyloarthropathies (include Degree/Grade of displacement in mm) (stability) 5. Vertebral body Fractures (acute/chronic) (state percentage of collapse) 6. Demineralization (osteopenia/osteoporotic) 7. Bone pathology 8. Foraminal narrowing  9. Surgical changes 10. Central, Lateral Recess, and/or Foraminal Stenosis (include AP diameter of stenosis in mm) 11. Surgical changes (hardware type, status, and  presence of fibrosis) 12. Modic Type Changes (MRI only) 13. IVDD (Disc bulge, protrusion, herniation, extrusion) (Level, laterality, extent)    Standing Status:   Future    Expiration Date:   12/31/2024    Scheduling Instructions:     Please make sure that the patient understands that this needs to be done as soon as possible. Never have the patient do the imaging just before the next appointment. Inform patient that having the imaging done within the [facility] will expedite the availability of the results and will provide      imaging availability to the requesting physician. In addition inform the patient that the imaging order has an expiration date and will not be renewed if not done within the active period.  What is the patient's sedation requirement?:   No Sedation    Does the patient have a pacemaker or implanted devices?:   No    Preferred imaging location?:   [facility] (table limit-350lbs)    Call Results- Best Contact Number?:   [phone] [facility]    Radiology Contrast Protocol - do NOT remove file path:   \\[email or web address]   PT Hydrotherapy    Standing Status:   Future    Expiration Date:   09/30/2025        Return in about 3 weeks (around 10/21/2024) for (F2F), review of ordered tests, Emmy Blanch NP.    Recent Visits No visits were found meeting these conditions. Showing recent visits within past 90 days and meeting all other requirements Today's Visits Date Type Provider Dept  09/30/24 Office Visit Traeton Bordas K, NP [facility]  Showing today's visits and meeting all other requirements Future Appointments No visits were found meeting these conditions. Showing future appointments within next 90 days and meeting all other requirements  I discussed the assessment and treatment plan with the patient. The patient was provided an opportunity to ask questions and  all were answered. The patient agreed with the plan and demonstrated an understanding of the instructions.  Patient advised to call back or seek an in-person evaluation if the symptoms or condition worsens.  I personally spent a total of 20 minutes in the care of the patient today including preparing to see the patient, getting/reviewing separately obtained history, performing a medically appropriate exam/evaluation, counseling and educating, placing orders, referring and communicating with other health care professionals, documenting clinical information in the EHR, independently interpreting results, communicating results, and coordinating care.   Note by: Filimon Miranda K Magie Ciampa, NP (TTS and AI technology used. I apologize for any typographical errors that were not detected and corrected.) Date: 09/30/2024; Time: 2:49 PM

## 2024-09-30 NOTE — Progress Notes (Signed)
 Established Patient Office Visit  Subjective:  Patient ID: Nicole Walter, female    DOB: 15-Aug-1961  Age: 63 y.o. MRN: [id number]  Chief Complaint  Patient presents with   Follow-up    3 month follow up    No new complaints, here for lab review and medication refills. LDL and TC well controlled on lab review. Triglycerides also satisfactory with unremarkable A1c, tsh and cmp.     No other concerns at this time.   Past Medical History:  Diagnosis Date   Abnormal liver enzymes 02/08/2016   Allergy    Anxiety    Chest pain 07/05/2016   Chronic pain    Depression    DJD (degenerative joint disease)    lumbar   Fibromyalgia    High cholesterol    Rash of back 01/28/2016   Weight loss due to medication     Past Surgical History:  Procedure Laterality Date   ANKLE SURGERY     x 2   BREAST BIOPSY Left 1997   CHOLECYSTECTOMY     COLONOSCOPY     COLONOSCOPY WITH PROPOFOL  N/A 02/22/2016   Procedure: COLONOSCOPY WITH PROPOFOL ;  Surgeon: Gladis RAYMOND Mariner, MD;  [facility] END[facility]COPY;  Service: Endoscopy;  Laterality: N/A;   ESOPHAGOGASTRODUODENOSCOPY (EGD) WITH PROPOFOL  N/A 02/22/2016   Procedure: ESOPHAGOGASTRODUODENOSCOPY (EGD) WITH PROPOFOL ;  Surgeon: Gladis RAYMOND Mariner, MD;  Location: [facility] ENDOSCOPY;  Service: Endoscopy;  Laterality: N/A;   NASAL SINUS SURGERY     SHOULDER ARTHROSCOPY WITH ROTATOR CUFF REPAIR AND SUBACROMIAL DECOMPRESSION Left 09/23/2019   Procedure: SHOULDER ARTHROSCOPY WITH MINI ROTATOR CUFF REPAIR AND SUBACROMIAL DECOMPRESSION, BICEPS TENODESIS;  Surgeon: Tobie Priest, MD;  Location: [facility];  Service: Orthopedics;  Laterality: Left;   TONSILLECTOMY     TUBAL LIGATION      Social History   Socioeconomic History   Marital status: Legally Separated    Spouse name: Not on file   Number of children: Not on file   Years of education: Not on file   Highest education level: Not on file  Occupational History   Not on file  Tobacco Use    Smoking status: Former    Current packs/day: 0.00    Average packs/day: 1 pack/day for 41.3 years (41.3 ttl pk-yrs)    Types: E-cigarettes, Cigarettes    Start date: 03/06/1974    Quit date: 07/08/2015    Years since quitting: 9.2   Smokeless tobacco: Never   Tobacco comments:    vapor cigarettes, no nicotene  Vaping Use   Vaping status: Every Day   Start date: 07/08/2015   Substances: Nicotine , Flavoring   Devices: Nort, Geek  Substance and Sexual Activity   Alcohol use: No    Alcohol/week: 0.0 standard drinks of alcohol   Drug use: No   Sexual activity: Not Currently    Birth control/protection: Post-menopausal  Other Topics Concern   Not on file  Social History Narrative   Not on file   Social Drivers of Health   Financial Resource Strain: Medium Risk (06/03/2024)   Received from [facility]   Overall Financial Resource Strain (CARDIA)    Difficulty of Paying Living Expenses: Somewhat hard  Food Insecurity: Food Insecurity Present (06/03/2024)   Received from [facility]   Hunger Vital Sign    Within the past 12 months, you worried that your food would run out before you got the money to buy more.: Often true    Within the  past 12 months, the food you bought just didn't last and you didn't have money to get more.: Often true  Transportation Needs: No Transportation Needs (06/03/2024)   Received from [facility] - Transportation    In the past 12 months, has lack of transportation kept you from medical appointments or from getting medications?: No    Lack of Transportation (Non-Medical): No  Physical Activity: Not on file  Stress: Not on file  Social Connections: Not on file  Intimate Partner Violence: Not on file    Family History  Problem Relation Age of Onset   Heart disease Mother    Stroke Mother    Cancer Father        lung   Arthritis Father    Arthritis Paternal Grandmother    Arthritis Paternal  Grandfather    Drug abuse Sister    Drug abuse Brother    Post-traumatic stress disorder Brother    Diabetes Sister    Dementia Sister    Drug abuse Brother    Cancer Brother     Allergies  Allergen Reactions   Other Hives and Other (See Comments)    Pt states she was tested for allergies and peas was one that she is allergic to but has never ate enough of them to see a reaction. Pt states when she is around bird feathers she gets the hives FEATHERS   Adhesive [Tape] Hives   Food     PEAS/ patient does not know what type reaction she has/ from allergy test during childhood   Statins Other (See Comments)    Severe myalgia   Pea Other (See Comments)    Other reaction(Pranay Hilbun): Unknown.  Pt states she was tested for allergies and peas was one that she is allergic to but has never ate enough of them to see a reaction.    Outpatient Medications Prior to Visit  Medication Sig   Azelastine  HCl 137 MCG/SPRAY SOLN Place 1 Inhalation into the nose daily.   Calcium Carb-Cholecalciferol (CALCIUM 600 + D) 600-5 MG-MCG TABS Take by mouth.   cetirizine  (ZYRTEC ) 10 MG tablet Take 1 tablet (10 mg total) by mouth every morning.   ezetimibe  (ZETIA ) 10 MG tablet Take 1 tablet (10 mg total) by mouth daily.   ibuprofen  (ADVIL ) 800 MG tablet Take 1 tablet (800 mg total) by mouth every 8 (eight) hours as needed.   LINZESS  72 MCG capsule TK 1 C PO QD   LORazepam  (ATIVAN ) 1 MG tablet Take 1 tablet (1 mg total) by mouth every 8 (eight) hours as needed. for anxiety   Multiple Vitamin (MULTI-VITAMINS) TABS Take 1 tablet by mouth daily.   naloxone  (NARCAN ) nasal spray 4 mg/0.1 mL 1 spray in nostril if very drowsy after taking pain medicine   omeprazole (PRILOSEC) 40 MG capsule Take 40 mg by mouth daily.   ondansetron  (ZOFRAN -ODT) 4 MG disintegrating tablet Take 1 tablet (4 mg total) by mouth every 8 (eight) hours as needed for nausea or vomiting.   oxyCODONE  (OXY IR/ROXICODONE ) 5 MG immediate release tablet Take 1  tablet (5 mg total) by mouth 2 (two) times daily as needed for severe pain (pain score 7-10). Prescribed by Albina GORMAN Dine, MD   Vilazodone HCl (VIIBRYD) 10 MG TABS Take 10 mg by mouth daily.   nicotine  (NICODERM CQ  - DOSED IN MG/24 HOURS) 14 mg/24hr patch Place 1 patch (14 mg total) onto the skin daily. (Patient not taking: Reported on 06/16/2024)   [  DISCONTINUED] fenofibrate  160 MG tablet Take 1 tablet (160 mg total) by mouth daily. (Patient not taking: Reported on 09/30/2024)   No facility-administered medications prior to visit.    Review of Systems  Constitutional:  Negative for weight loss (gained 6 lbs).  HENT: Negative.    Eyes: Negative.   Cardiovascular: Negative.   Gastrointestinal: Negative.   Genitourinary: Negative.        Urge incontinence  Musculoskeletal:  Positive for back pain, joint pain and neck pain.       As in hpi  Skin: Negative.   Neurological: Negative.   Endo/Heme/Allergies: Negative.   Psychiatric/Behavioral:  The patient is nervous/anxious.        Objective:   BP 121/78   Pulse 67   Temp 97.9 F (36.6 C)   Ht 5' 4 (1.626 m)   Wt 152 lb 12.8 oz (69.3 kg)   SpO2 97%   BMI 26.23 kg/m   Vitals:   09/30/24 0959  BP: 121/78  Pulse: 67  Temp: 97.9 F (36.6 C)  Height: 5' 4 (1.626 m)  Weight: 152 lb 12.8 oz (69.3 kg)  SpO2: 97%  BMI (Calculated): 26.22    Physical Exam Vitals reviewed.  Constitutional:      General: She is not in acute distress. HENT:     Head: Normocephalic.     Nose: Nose normal.     Mouth/Throat:     Mouth: Mucous membranes are moist.  Eyes:     Extraocular Movements: Extraocular movements intact.     Pupils: Pupils are equal, round, and reactive to light.  Cardiovascular:     Rate and Rhythm: Normal rate and regular rhythm.     Heart sounds: No murmur heard. Pulmonary:     Effort: Pulmonary effort is normal.     Breath sounds: No rhonchi or rales.  Abdominal:     General: Abdomen is flat.      Palpations: There is no hepatomegaly, splenomegaly or mass.     Tenderness: There is abdominal tenderness in the left lower quadrant. There is no guarding or rebound.  Musculoskeletal:        General: Normal range of motion.     Cervical back: Normal range of motion. No tenderness.  Skin:    General: Skin is warm and dry.  Neurological:     General: No focal deficit present.     Mental Status: She is alert and oriented to person, place, and time.     Cranial Nerves: No cranial nerve deficit.     Motor: No weakness.  Psychiatric:        Mood and Affect: Mood normal.        Behavior: Behavior normal.      No results found for any visits on 09/30/24.  Lipid Panel     Component Value Date/Time   CHOL 182 09/25/2024 0938   TRIG 60 09/25/2024 0938   HDL 75 09/25/2024 0938   CHOLHDL 2.4 09/25/2024 0938   CHOLHDL 5 10/08/2018 1028   VLDL 28.8 10/08/2018 1028   LDLCALC 96 09/25/2024 0938   LDLDIRECT 97.0 10/17/2016 1537   LABVLDL 11 09/25/2024 0938       Assessment & Plan:  Wilson was seen today for follow-up.  Fatty liver  Mixed hyperlipidemia -     Fenofibrate ; Take 1 tablet (160 mg total) by mouth daily.  Dispense: 90 tablet; Refill: 0 -     Lipid panel  Fibromyalgia  Anxiety and depression  Moderate  episode of recurrent major depressive disorder (HCC)  Overweight (BMI 25.0-29.9) -     CMP14+EGFR -     Lipid panel  Hypertension, essential -     CMP14+EGFR -     Lipid panel  Impaired fasting glucose    Problem List Items Addressed This Visit       Digestive   Fatty liver - Primary     Other   Fibromyalgia (Chronic)   Anxiety and depression   Hyperlipidemia   Relevant Medications   fenofibrate  160 MG tablet   Other Relevant Orders   Lipid panel   Overweight (BMI 25.0-29.9)   Relevant Orders   CMP14+EGFR   Lipid panel   Other Visit Diagnoses       Moderate episode of recurrent major depressive disorder (HCC)         Hypertension,  essential       Relevant Medications   fenofibrate  160 MG tablet   Other Relevant Orders   CMP14+EGFR   Lipid panel     Impaired fasting glucose           Return in about 3 months (around 12/31/2024) for fu with labs prior.   Total time spent: 20 minutes. This time includes review of previous notes and results and patient face to face interaction during today'Raihan Kimmel visit.    Sherrill Cinderella Perry, MD  09/30/2024   This document may have been prepared by [facility] Voice Recognition software and as such may include unintentional dictation errors.

## 2024-10-13 ENCOUNTER — Ambulatory Visit: Admitting: Internal Medicine

## 2024-10-13 ENCOUNTER — Telehealth: Payer: Self-pay

## 2024-10-13 DIAGNOSIS — F32A Depression, unspecified: Secondary | ICD-10-CM

## 2024-10-13 DIAGNOSIS — J302 Other seasonal allergic rhinitis: Secondary | ICD-10-CM

## 2024-10-13 DIAGNOSIS — M5442 Lumbago with sciatica, left side: Secondary | ICD-10-CM

## 2024-10-13 DIAGNOSIS — F419 Anxiety disorder, unspecified: Secondary | ICD-10-CM | POA: Diagnosis not present

## 2024-10-13 DIAGNOSIS — G8929 Other chronic pain: Secondary | ICD-10-CM | POA: Diagnosis not present

## 2024-10-13 DIAGNOSIS — M5441 Lumbago with sciatica, right side: Secondary | ICD-10-CM

## 2024-10-13 DIAGNOSIS — I10 Essential (primary) hypertension: Secondary | ICD-10-CM

## 2024-10-13 DIAGNOSIS — Z131 Encounter for screening for diabetes mellitus: Secondary | ICD-10-CM

## 2024-10-13 MED ORDER — OXYCODONE HCL 5 MG PO TABS: 5.0000 mg | ORAL_TABLET | Freq: Every day | ORAL | 0 refills | Status: DC | PRN

## 2024-10-13 MED ORDER — IBUPROFEN 800 MG PO TABS: 800.0000 mg | ORAL_TABLET | Freq: Three times a day (TID) | ORAL | 0 refills | Status: DC | PRN

## 2024-10-13 MED ORDER — LORAZEPAM 1 MG PO TABS: 1.0000 mg | ORAL_TABLET | Freq: Three times a day (TID) | ORAL | 1 refills | Status: AC | PRN

## 2024-10-13 NOTE — Telephone Encounter (Signed)
 Pt LM asking for call back about her PM, medications, PA for the medication has already been completed, just waiting for the response from insurance co

## 2024-10-13 NOTE — Progress Notes (Signed)
 Established Patient Office Visit  Subjective:  Patient ID: Nicole Walter, female    DOB: 30-Jul-1961  Age: 63 y.o. MRN: [id number]  Chief Complaint  Patient presents with   Pain Management    PM    Here for pain management follow up. Chronic pain well controlled on current analgesia. Last drug screen satisfactory and pill counts have also been satisfactory.     No other concerns at this time.   Past Medical History:  Diagnosis Date   Abnormal liver enzymes 02/08/2016   Allergy    Anxiety    Chest pain 07/05/2016   Chronic pain    Depression    DJD (degenerative joint disease)    lumbar   Fibromyalgia    High cholesterol    Rash of back 01/28/2016   Weight loss due to medication     Past Surgical History:  Procedure Laterality Date   ANKLE SURGERY     x 2   BREAST BIOPSY Left 1997   CHOLECYSTECTOMY     COLONOSCOPY     COLONOSCOPY WITH PROPOFOL  N/A 02/22/2016   Procedure: COLONOSCOPY WITH PROPOFOL ;  Surgeon: Gladis RAYMOND Mariner, MD;  Location: [facility] ENDOSCOPY;  Service: Endoscopy;  Laterality: N/A;   ESOPHAGOGASTRODUODENOSCOPY (EGD) WITH PROPOFOL  N/A 02/22/2016   Procedure: ESOPHAGOGASTRODUODENOSCOPY (EGD) WITH PROPOFOL ;  Surgeon: Gladis RAYMOND Mariner, MD;  Location: [facility] ENDOSCOPY;  Service: Endoscopy;  Laterality: N/A;   NASAL SINUS SURGERY     SHOULDER ARTHROSCOPY WITH ROTATOR CUFF REPAIR AND SUBACROMIAL DECOMPRESSION Left 09/23/2019   Procedure: SHOULDER ARTHROSCOPY WITH MINI ROTATOR CUFF REPAIR AND SUBACROMIAL DECOMPRESSION, BICEPS TENODESIS;  Surgeon: Tobie Priest, MD;  Location: [facility];  Service: Orthopedics;  Laterality: Left;   TONSILLECTOMY     TUBAL LIGATION      Social History   Socioeconomic History   Marital status: Legally Separated    Spouse name: Not on file   Number of children: Not on file   Years of education: Not on file   Highest education level: Not on file  Occupational History   Not on file  Tobacco Use   Smoking status: Former     Current packs/day: 0.00    Average packs/day: 1 pack/day for 41.3 years (41.3 ttl pk-yrs)    Types: E-cigarettes, Cigarettes    Start date: 03/06/1974    Quit date: 07/08/2015    Years since quitting: 9.2   Smokeless tobacco: Never   Tobacco comments:    vapor cigarettes, no nicotene  Vaping Use   Vaping status: Every Day   Start date: 07/08/2015   Substances: Nicotine , Flavoring   Devices: Nort, Geek  Substance and Sexual Activity   Alcohol use: No    Alcohol/week: 0.0 standard drinks of alcohol   Drug use: No   Sexual activity: Not Currently    Birth control/protection: Post-menopausal  Other Topics Concern   Not on file  Social History Narrative   Not on file   Social Drivers of Health   Financial Resource Strain: Medium Risk (06/03/2024)   Received from [facility]   Overall Financial Resource Strain (CARDIA)    Difficulty of Paying Living Expenses: Somewhat hard  Food Insecurity: Food Insecurity Present (06/03/2024)   Received from [facility]   Hunger Vital Sign    Within the past 12 months, you worried that your food would run out before you got the money to buy more.: Often true    Within the past 12 months, the food  you bought just didn't last and you didn't have money to get more.: Often true  Transportation Needs: No Transportation Needs (06/03/2024)   Received from [facility] - Transportation    In the past 12 months, has lack of transportation kept you from medical appointments or from getting medications?: No    Lack of Transportation (Non-Medical): No  Physical Activity: Not on file  Stress: Not on file  Social Connections: Not on file  Intimate Partner Violence: Not on file    Family History  Problem Relation Age of Onset   Heart disease Mother    Stroke Mother    Cancer Father        lung   Arthritis Father    Arthritis Paternal Grandmother    Arthritis Paternal Grandfather    Drug  abuse Sister    Drug abuse Brother    Post-traumatic stress disorder Brother    Diabetes Sister    Dementia Sister    Drug abuse Brother    Cancer Brother     Allergies  Allergen Reactions   Other Hives and Other (See Comments)    Pt states she was tested for allergies and peas was one that she is allergic to but has never ate enough of them to see a reaction. Pt states when she is around bird feathers she gets the hives FEATHERS   Adhesive [Tape] Hives   Food     PEAS/ patient does not know what type reaction she has/ from allergy test during childhood   Statins Other (See Comments)    Severe myalgia   Pea Other (See Comments)    Other reaction(Charene Mccallister): Unknown.  Pt states she was tested for allergies and peas was one that she is allergic to but has never ate enough of them to see a reaction.    Outpatient Medications Prior to Visit  Medication Sig   Azelastine  HCl 137 MCG/SPRAY SOLN Place 1 Inhalation into the nose daily.   Calcium Carb-Cholecalciferol (CALCIUM 600 + D) 600-5 MG-MCG TABS Take by mouth.   cetirizine  (ZYRTEC ) 10 MG tablet Take 1 tablet (10 mg total) by mouth every morning.   ezetimibe  (ZETIA ) 10 MG tablet Take 1 tablet (10 mg total) by mouth daily.   fenofibrate  160 MG tablet Take 1 tablet (160 mg total) by mouth daily.   ibuprofen  (ADVIL ) 800 MG tablet Take 1 tablet (800 mg total) by mouth every 8 (eight) hours as needed.   LINZESS  72 MCG capsule TK 1 C PO QD   Multiple Vitamin (MULTI-VITAMINS) TABS Take 1 tablet by mouth daily.   mupirocin ointment (BACTROBAN) 2 % Apply to affected area of the skin up to three times per day as needed.   naloxone  (NARCAN ) nasal spray 4 mg/0.1 mL 1 spray in nostril if very drowsy after taking pain medicine   nicotine  (NICODERM CQ  - DOSED IN MG/24 HOURS) 14 mg/24hr patch Place 1 patch (14 mg total) onto the skin daily.   omeprazole (PRILOSEC) 40 MG capsule Take 40 mg by mouth daily.   ondansetron  (ZOFRAN -ODT) 4 MG disintegrating  tablet Take 1 tablet (4 mg total) by mouth every 8 (eight) hours as needed for nausea or vomiting.   Vilazodone HCl (VIIBRYD) 10 MG TABS Take 10 mg by mouth daily.   No facility-administered medications prior to visit.    Review of Systems  Constitutional:  Positive for weight loss (lost 1 lb).  HENT: Negative.    Eyes: Negative.  Cardiovascular: Negative.   Gastrointestinal: Negative.   Genitourinary: Negative.        Urge incontinence  Musculoskeletal:  Positive for back pain, joint pain and neck pain.       As in hpi  Skin: Negative.   Neurological: Negative.   Endo/Heme/Allergies: Negative.   Psychiatric/Behavioral:  The patient is nervous/anxious.        Objective:   BP (!) 142/68   Pulse 70   Ht 5' 4 (1.626 m)   Wt 153 lb (69.4 kg)   SpO2 98%   BMI 26.26 kg/m   Vitals:   10/13/24 1044  BP: (!) 142/68  Pulse: 70  Height: 5' 4 (1.626 m)  Weight: 153 lb (69.4 kg)  SpO2: 98%  BMI (Calculated): 26.25    Physical Exam Vitals reviewed.  Constitutional:      General: She is not in acute distress. HENT:     Head: Normocephalic.     Nose: Nose normal.     Mouth/Throat:     Mouth: Mucous membranes are moist.  Eyes:     Extraocular Movements: Extraocular movements intact.     Pupils: Pupils are equal, round, and reactive to light.  Cardiovascular:     Rate and Rhythm: Normal rate and regular rhythm.     Heart sounds: No murmur heard. Pulmonary:     Effort: Pulmonary effort is normal.     Breath sounds: No rhonchi or rales.  Abdominal:     General: Abdomen is flat.     Palpations: There is no hepatomegaly, splenomegaly or mass.     Tenderness: There is abdominal tenderness in the left lower quadrant. There is no guarding or rebound.  Musculoskeletal:        General: Normal range of motion.     Cervical back: Normal range of motion. No tenderness.  Skin:    General: Skin is warm and dry.  Neurological:     General: No focal deficit present.      Mental Status: She is alert and oriented to person, place, and time.     Cranial Nerves: No cranial nerve deficit.     Motor: No weakness.  Psychiatric:        Mood and Affect: Mood normal.        Behavior: Behavior normal.      No results found for any visits on 10/13/24.      Assessment & Plan:  Haydn was seen today for pain management.  Anxiety and depression  Chronic low back pain (Bilateral) (L>R)  Seasonal allergic rhinitis, unspecified trigger    Problem List Items Addressed This Visit       Nervous and Auditory   Chronic low back pain (Bilateral) (L>R) (Chronic)     Other   Anxiety and depression   Other Visit Diagnoses       Seasonal allergic rhinitis, unspecified trigger           Return in about 1 month (around 11/13/2024) for Pain Management.   Total time spent: 20 minutes. This time includes review of previous notes and results and patient face to face interaction during today'Glenden Rossell visit.    Sherrill Cinderella Perry, MD  10/13/2024   This document may have been prepared by [facility] Voice Recognition software and as such may include unintentional dictation errors.

## 2024-10-20 ENCOUNTER — Telehealth: Payer: Self-pay

## 2024-10-20 ENCOUNTER — Other Ambulatory Visit: Payer: Self-pay | Admitting: Nurse Practitioner

## 2024-10-20 DIAGNOSIS — G894 Chronic pain syndrome: Secondary | ICD-10-CM

## 2024-10-20 DIAGNOSIS — M19011 Primary osteoarthritis, right shoulder: Secondary | ICD-10-CM

## 2024-10-20 DIAGNOSIS — M5414 Radiculopathy, thoracic region: Secondary | ICD-10-CM

## 2024-10-20 DIAGNOSIS — M5134 Other intervertebral disc degeneration, thoracic region: Secondary | ICD-10-CM

## 2024-10-20 DIAGNOSIS — M47894 Other spondylosis, thoracic region: Secondary | ICD-10-CM

## 2024-10-20 NOTE — Telephone Encounter (Signed)
 Insurance denied auth for thoracic and shoulder MRI because she has not had PT for those areas. I called the patient and she is willing to do PT for that if you will put in the order. I did get her right hip MRI scheduled because she has had PT for that.

## 2024-10-21 ENCOUNTER — Ambulatory Visit: Admission: RE | Admit: 2024-10-21 | Source: Ambulatory Visit

## 2024-10-23 ENCOUNTER — Ambulatory Visit: Attending: Student in an Organized Health Care Education/Training Program | Admitting: Physical Therapy

## 2024-10-23 ENCOUNTER — Encounter: Payer: Self-pay | Admitting: Physical Therapy

## 2024-10-23 ENCOUNTER — Ambulatory Visit

## 2024-10-23 DIAGNOSIS — M542 Cervicalgia: Secondary | ICD-10-CM | POA: Diagnosis present

## 2024-10-23 DIAGNOSIS — M5414 Radiculopathy, thoracic region: Secondary | ICD-10-CM | POA: Diagnosis present

## 2024-10-23 DIAGNOSIS — G8929 Other chronic pain: Secondary | ICD-10-CM | POA: Diagnosis present

## 2024-10-23 DIAGNOSIS — M47894 Other spondylosis, thoracic region: Secondary | ICD-10-CM | POA: Insufficient documentation

## 2024-10-23 DIAGNOSIS — G894 Chronic pain syndrome: Secondary | ICD-10-CM | POA: Insufficient documentation

## 2024-10-23 DIAGNOSIS — M546 Pain in thoracic spine: Secondary | ICD-10-CM | POA: Diagnosis present

## 2024-10-23 DIAGNOSIS — M25511 Pain in right shoulder: Secondary | ICD-10-CM | POA: Diagnosis present

## 2024-10-23 DIAGNOSIS — M25551 Pain in right hip: Secondary | ICD-10-CM | POA: Insufficient documentation

## 2024-10-23 DIAGNOSIS — M25512 Pain in left shoulder: Secondary | ICD-10-CM | POA: Insufficient documentation

## 2024-10-23 DIAGNOSIS — R200 Anesthesia of skin: Secondary | ICD-10-CM | POA: Insufficient documentation

## 2024-10-23 DIAGNOSIS — M19011 Primary osteoarthritis, right shoulder: Secondary | ICD-10-CM | POA: Diagnosis not present

## 2024-10-23 DIAGNOSIS — M5134 Other intervertebral disc degeneration, thoracic region: Secondary | ICD-10-CM | POA: Insufficient documentation

## 2024-10-23 DIAGNOSIS — M5459 Other low back pain: Secondary | ICD-10-CM | POA: Insufficient documentation

## 2024-10-23 NOTE — Therapy (Addendum)
 OUTPATIENT PHYSICAL THERAPY THORACOLUMBAR EVALUATION   Patient Name: Nicole Walter MRN: [id number] DOB:01/12/1961, 63 y.o., female Today's Date: 10/23/2024  END OF SESSION:  PT End of Session - 10/23/24 1318     Visit Number 1    Number of Visits 24    Progress Note Due on Visit 10    PT Start Time 1319    PT Stop Time 1400    PT Time Calculation (min) 41 min    Activity Tolerance Patient tolerated treatment well    Behavior During Therapy [facility] for tasks assessed/performed          Past Medical History:  Diagnosis Date   Abnormal liver enzymes 02/08/2016   Allergy    Anxiety    Chest pain 07/05/2016   Chronic pain    Depression    DJD (degenerative joint disease)    lumbar   Fibromyalgia    High cholesterol    Rash of back 01/28/2016   Weight loss due to medication    Past Surgical History:  Procedure Laterality Date   ANKLE SURGERY     x 2   BREAST BIOPSY Left 1997   CHOLECYSTECTOMY     COLONOSCOPY     COLONOSCOPY WITH PROPOFOL  N/A 02/22/2016   Procedure: COLONOSCOPY WITH PROPOFOL ;  Surgeon: Gladis RAYMOND Mariner, MD;  Location: [facility] ENDOSCOPY;  Service: Endoscopy;  Laterality: N/A;   ESOPHAGOGASTRODUODENOSCOPY (EGD) WITH PROPOFOL  N/A 02/22/2016   Procedure: ESOPHAGOGASTRODUODENOSCOPY (EGD) WITH PROPOFOL ;  Surgeon: Gladis RAYMOND Mariner, MD;  Location: [facility] ENDOSCOPY;  Service: Endoscopy;  Laterality: N/A;   NASAL SINUS SURGERY     SHOULDER ARTHROSCOPY WITH ROTATOR CUFF REPAIR AND SUBACROMIAL DECOMPRESSION Left 09/23/2019   Procedure: SHOULDER ARTHROSCOPY WITH MINI ROTATOR CUFF REPAIR AND SUBACROMIAL DECOMPRESSION, BICEPS TENODESIS;  Surgeon: Tobie Priest, MD;  Location: [facility];  Service: Orthopedics;  Laterality: Left;   TONSILLECTOMY     TUBAL LIGATION     Patient Active Problem List   Diagnosis Date Noted   Other intervertebral disc degeneration, thoracic region 09/30/2024   Primary osteoarthritis of right hip 09/30/2024   Postmenopausal bleeding  07/29/2024   Dyspepsia 10/15/2023   Hot flash, menopausal 09/03/2023   Chronic bronchitis (HCC) 09/03/2023   Piriformis syndrome of right side 08/20/2023   Oral thrush 05/15/2023   Acute maxillary sinusitis 05/15/2023   Chronic radicular lumbar pain 03/20/2023   Exposure to sexually transmitted disease (STD) 01/25/2023   Acute vaginitis 01/25/2023   Abnormal cervical Papanicolaou smear 01/25/2023   Abnormal MRI, cervical spine 07/22/2019   Neurogenic pain 07/22/2019   DDD (degenerative disc disease), cervical 06/09/2019   Chronic musculoskeletal pain 05/14/2019   Needle phobia 09/26/2018   Spondylosis without myelopathy or radiculopathy, lumbar region 08/29/2018   Lumbar facet hypertrophy 08/29/2018   Lumbar facet syndrome (Right) 08/29/2018   DDD (degenerative disc disease), thoracic 08/29/2018   Thoracic spondylosis with radiculopathy (Right) 08/29/2018   Spinal enthesopathy of lumbar region 08/29/2018   Chronic midline low back pain without sciatica 08/29/2018   Generalized anxiety disorder 07/23/2018   Cervical facet hypertrophy (Bilateral) 07/18/2018   Cervical central spinal stenosis 07/18/2018   Chronic shoulder pain (Left) 07/09/2018   Cervicalgia (Bilateral) (L>R) 03/28/2018   Spondylosis without myelopathy or radiculopathy, cervical region 02/11/2018   Chronic pelvic pain in female 08/24/2017   Fatty liver 08/24/2017   Chronic low back pain (Bilateral) (L>R) 08/13/2017   Foraminal stenosis of cervical region 08/13/2017   Chronic low back pain 08/13/2017   Cervical sensory radiculopathy  at C5 (Bilateral) 08/13/2017   Cervical radicular pain 08/13/2017   Constipation 07/16/2017   Myofascial pain syndrome, cervical (trapezius) (Left) 05/28/2017   Cervical facet syndrome (Bilateral) (L>R) 05/28/2017   Muscle spasm 05/15/2017   Chronic hip pain (Right) 03/19/2017   Chronic knee arthropathy (Right) 03/14/2017   Osteoarthritis of knee (Right) 02/21/2017   Osteoarthritis  of shoulder (Right) 01/31/2017   Shortness of breath 01/15/2017   Thoracic spondylosis 01/01/2017   Radicular pain of thoracic region 12/21/2016   Osteoarthritis of hip (Bilateral) 11/20/2016   Greater trochanteric bursitis (Right) 09/18/2016   Subacromial bursitis of shoulder joint (Right) 09/18/2016   Overweight (BMI 25.0-29.9) 09/14/2016   ESR raised 08/31/2016   Elevated C-reactive protein (CRP) 08/31/2016   Long term (current) use of opiate analgesic 07/20/2016   Long term prescription opiate use 07/20/2016   Opiate use 07/20/2016   Chronic upper back pain (Secondary Area of Pain) (Bilateral) (L>R) 07/20/2016   Chronic shoulder pain (Tertiary Area of Pain) (Bilateral) (L>R) 07/20/2016   Chronic knee pain (Right) 07/20/2016   Chronic upper extremity pain (Left) 07/20/2016   Chronic lower extremity pain (Bilateral) (L>R) 07/20/2016   Chronic abdominal pain 07/20/2016   Chronic hip pain (Bilateral) (R>L) 05/31/2016   Chronic Greater trochanteric bursitis (Bilateral) (L>R) 05/31/2016   Bursitis of shoulder (Right) 05/17/2016   Skin lesions 04/27/2016   Hyperlipidemia 02/18/2016   Cervical spondylosis (Bilateral) 02/08/2016   Fibromyalgia 02/08/2016   Osteopenia 02/08/2016   Chronic neck pain (Primary Area of Pain) (Bilateral) (L>R) 01/28/2016   Concentration deficit 01/28/2016   Routine history and physical examination of adult 01/14/2016   Lumbar spondylosis 01/06/2016   Anxiety and depression 07/11/2015   Chronic pain syndrome 07/11/2015   Combined fat and carbohydrate induced hyperlipemia 07/11/2015    PCP: Albina GORMAN Dine, MD   REFERRING PROVIDER: Tobie Emmy POUR, NP   REFERRING DIAG:  M54.14 (ICD-10-CM) - Radicular pain of thoracic region M19.011 (ICD-10-CM) - Primary osteoarthritis of right shoulder G89.4 (ICD-10-CM) - Chronic pain syndrome M47.894 (ICD-10-CM) - Other spondylosis, thoracic region M51.34 (ICD-10-CM) - Other intervertebral disc degeneration,  thoracic region   Rationale for Evaluation and Treatment: Rehabilitation  THERAPY DIAG:  Pain in right hip  Chronic pain syndrome  Pain in thoracic spine  Radiculopathy, thoracic region  Numbness in both hands  Other low back pain  Chronic pain of both shoulders  ONSET DATE: since around 2015 it's gotten worse  SUBJECTIVE:  SUBJECTIVE STATEMENT: Pt says her pain is in the R side her body, traveling from her cervical spine, down midline towards the thoracic region, and down into her quadratus lumborum/ hip region. Pt reports pain even being caused by pressure from her waistband against the muscle tissue. She reports the worst pain she experiences is in her R hip, like someone is trying to pry it apart. Reports minor relief in R cervical pain and R hip pain following last bout of physical therapy. Thoracic pain is also reported as main concern, as well as numbness in middle three fingers on both hands. Also reports seeing a chronic pain doctor, and that she is diagnosed with fibromyalgia. Reports finding minimal relief with multiple rounds of steroid injections. Says she is trying to wean herself off of the pain medications as well.  PERTINENT HISTORY:  From recent progress note with referring provider: Ms. Margean Korell, a 63 y.o. year old female, is here today because of her Primary osteoarthritis of right hip [M16.11]. Ms. Sheldon primary complain today is Neck Pain   Pertinent problems: Ms. Rosetti has Chronic pain syndrome; Lumbar spondylosis; Chronic neck pain (Primary Area of Pain) (Bilateral) (L>R); Cervical spondylosis (Bilateral); Fibromyalgia; Chronic hip pain (Bilateral) (R>L); Chronic Greater trochanteric bursitis (Bilateral) (L>R); Long term prescription opiate use; Opiate use; Chronic  upper back pain (Secondary Area of Pain) (Bilateral) (L>R); Chronic shoulder pain (Tertiary Area of Pain) (Bilateral) (L>R); Chronic knee pain (Right); Chronic upper extremity pain (Left); Chronic lower extremity pain (Bilateral) (L>R); Osteoarthritis of hip (Bilateral); Thoracic spondylosis; Osteoarthritis of shoulder (Right); Osteoarthritis of knee (Right); Chronic knee arthropathy (Right); Chronic hip pain (Right); Myofascial pain syndrome, cervical (trapezius) (Left); Cervical facet syndrome (Bilateral) (L>R); Chronic low back pain (Bilateral) (L>R); Cervical sensory radiculopathy at C5 (Bilateral); Spondylosis without myelopathy or radiculopathy, cervical region; Cervicalgia (Bilateral) (L>R); and Chronic shoulder pain (Left) on their pertinent problem list.  PAIN:  Are you having pain? Yes: NPRS scale: < 5/10 usually Pain location: pain pathway as described above in subjective Pain description: burning, achy, stretched muscle sensation Aggravating factors: worse when she is sleeping, worse when she is on her side, bending side to side Relieving factors: Used to do ice, now taking ibuprofen  and pain medication, which doesn't help much  PRECAUTIONS: None  RED FLAGS: None   WEIGHT BEARING RESTRICTIONS: No  FALLS:  Has patient fallen in last 6 months? 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  NEXT MD VISIT: N/A, looking to get approved for an MRI appointment of the R shoulder to see if further medical intervention is needed  OBJECTIVE:  Note: Objective measures were completed at Evaluation unless otherwise  noted.  DIAGNOSTIC FINDINGS:  From Thoracic Spine MRI 08/27/23: MRI THORACIC SPINE FINDINGS   Alignment: No listhesis. In mild levocurvature of the upper thoracic spine with dextrocurvature of the lower thoracic spine and thoracolumbar junction. Straightening of the normal thoracic kyphosis.   Vertebrae: No acute fracture, evidence of discitis, or suspicious osseous lesion.   Cord:  Normal signal and morphology.   Paraspinal and other soft tissues: Negative.   Disc levels:   T1-T2: Facet arthropathy.  No spinal canal stenosis. Moderate to severe right and moderate left neural foraminal narrowing, unchanged.   T2-T3: Right eccentric disc bulge. Right greater than left facet arthropathy. No spinal canal stenosis. Severe right neural foraminal narrowing, unchanged.   T3-T4: Right eccentric disc bulge. Right greater than facet arthropathy. No spinal canal stenosis. Moderate right neural foraminal narrowing.   T4-T5: Facet arthropathy. No spinal canal stenosis or neural foraminal narrowing.   T5-T6: Left paracentral disc protrusion. No spinal canal stenosis or neural foraminal narrowing.   T6-T7: Small central disc protrusion. No spinal canal stenosis or neural foraminal narrowing.   T7-T8: Small right subarticular disc protrusion. No spinal canal stenosis or neural foraminal narrowing.   T8-T9: Facet arthropathy. No spinal canal stenosis or neural foraminal narrowing.   T9-T10: Moderate facet arthropathy. No spinal canal stenosis. Mild bilateral neural foraminal narrowing.   T10-T11: Moderate to severe facet arthropathy. No spinal canal stenosis. Mild bilateral neural foraminal narrowing.   T11-T12: Moderate facet arthropathy. No spinal canal stenosis. Mild left neural foraminal narrowing.    PATIENT SURVEYS:  At next session*  COGNITION: Overall cognitive status: Within functional limits for tasks assessed     SENSATION: Light touch: Impaired  Gross R UE  decreased compared to L, numbness reported in middle three fingers of R hand occasionally    POSTURE: rounded shoulders, forward head, increased lumbar lordosis, and decreased thoracic kyphosis  PALPATION: Multiple tight mm tissue bands and trigger points found upon palpation of pt posterior trunk: B UT tightness, R rhomboid trigger points/midline of scapula, gross paraspinal mm guarding and trigger points from cervical to lumbar region, R lower back tightness,painful to the touch  UE MMT:  R shoulder flexion: 5 L shoulder flexion: 5  R shoulder abduction: 4+, sharp pain up to shoulder from arm L shoulder abduction: 5  R shoulder IR: 4+ L shoulder IR: 4, sharp pain  R shoulder ER: 4+, sharp pain L shoulder ER: 5  UE ROM: All WFL, slight discomfort but no pain   LUMBAR ROM:   AROM eval  Flexion Fingertips to mid shin, sharp pain when max range reached in center of thoracolumbar junction and on R lower lumber  Extension WFL, no pain  Right lateral flexion Fingertips to top of knee cap, sharp pain on R side  Left lateral flexion Fingertips to mid knee cap, pain on R side  Right rotation 55*, no pain  Left rotation 62*, no pain   (Blank rows = not tested)  LOWER EXTREMITY ROM:     Active  Right eval Left eval  Hip flexion    Hip extension    Hip abduction    Hip adduction    Hip internal rotation    Hip external rotation    Knee flexion    Knee extension    Ankle dorsiflexion    Ankle plantarflexion    Ankle inversion    Ankle eversion     (Blank rows = not tested)  LOWER EXTREMITY MMT:    MMT Right eval Left eval  Hip flexion    Hip extension    Hip abduction    Hip adduction    Hip internal rotation    Hip external rotation    Knee flexion    Knee extension    Ankle dorsiflexion    Ankle plantarflexion    Ankle inversion    Ankle eversion     (Blank rows = not tested)  LUMBAR SPECIAL TESTS:  None at this session.  FUNCTIONAL TESTS:  Next  session.   TREATMENT DATE: 10/23/2024   *PT initial evaluation performed in today's session.*  HEP demonstration: -standing open books with elbows bent at elbows at 90 to decrease pressure on B shoulders, x15 each side                                                                                                          PATIENT EDUCATION:  Education details: Educated on causation of symptoms, past MRI imaging, HEP exercise, Person educated: Patient Education method: Demonstration Education comprehension: verbalized understanding  HOME EXERCISE PROGRAM:  Access Code: [email or web address] URL: [email or web address] Date: 10/23/2024 Prepared by: Massie Dollar  Exercises - Standing with Forearms Thoracic Rotation  - 1 x daily - 7 x weekly - 3 sets - 10 reps - 2-3 second  hold  ASSESSMENT: CLINICAL IMPRESSION: Patient is a 63 y.o. female who was seen today for physical therapy evaluation and treatment for radicular pain, intervertebral disc degeneration, and spondylosis of the thoracic spine. Pt shows postural abnormalities, slightly decreased and painful shoulder MMT, sensory deficits, significant mm guarding and trigger points in the back musculature, notably more so on the R side, and painful and limited AROM of the lumbar spine. Pt reported occasional sharp pains during session in B shoulders, R lumbar region, and central mid spine at the thoracolumbar region. Pt also has complain of B hand numbness in the median nerve distribution, and showed R arm decreased sensation. Pt has complex medical history affecting pain and past treatment outcomes, such a chronic pain syndrome, fibromyalgia, B rotator cuff injuries, lumbar and thoracic spondylosis, and chronic hip and knee arthritis and pain. Pt would benefit from further conversation about benefits of therapy, and what areas to work on specifically, due to multiple areas of impairment discussed today. Pt has concern of therapy note being  available to MD to justify R shoulder MRI to decide on further medical treatment. Pt will continue to benefit from skilled therapy to address remaining deficits in order to improve overall QoL and return to PLOF.    OBJECTIVE IMPAIRMENTS: decreased activity tolerance, decreased mobility, decreased ROM, decreased strength, hypomobility, increased fascial restrictions, impaired perceived functional ability, impaired flexibility, impaired sensation, impaired UE functional use, improper body mechanics, postural dysfunction, and pain.   ACTIVITY LIMITATIONS: sleeping, stairs, and bed mobility  PARTICIPATION LIMITATIONS: medication management and occupation  PERSONAL FACTORS: Age, Past/current experiences, Time since onset of injury/illness/exacerbation, and 3+ comorbidities: chronic pain syndrome, fibromyalgia, depression, DJD are also affecting patient's functional outcome.   REHAB POTENTIAL: Fair Pt has complex medical history including chronic pain syndrome and fibromyalgia, and has not found relief for success with a few previous bouts of therapy, or medication management.  CLINICAL DECISION MAKING: Evolving/moderate complexity  EVALUATION COMPLEXITY: Moderate   GOALS: Goals reviewed with patient? No  SHORT TERM GOALS: Target date: 11/20/2024  Pt will be independent with HEP in order to decrease thoracolumbar pain in order to improve pain-free function at home.  Baseline: Goal status: INITIAL   LONG TERM GOALS: Target date: 01/15/2025  Pt will decrease worst pain as reported on NPRS by at least two points in order to demonstrate clinically significant reduction in thoracolumbar pain. Baseline: 5-6/10 Goal status: INITIAL  2.  Pt will be able to increase active lumbar ROM with no tightness or sharp pains in order to be able to move freely and perform functional tasks for daily living. Baseline: See chart above. Goal status: INITIAL   PLAN:  PT FREQUENCY: 2x/week  PT DURATION:  12 weeks  PLANNED INTERVENTIONS: 97164- PT Re-evaluation, 97750- Physical Performance Testing, 97110-Therapeutic exercises, 97530- Therapeutic activity, 97112- Neuromuscular re-education, 97535- Self Care, [id number]- Manual therapy, [phone]- Gait training, [phone]- Orthotic Initial, [phone]- Orthotic/Prosthetic subsequent, [phone]- Aquatic Therapy, [phone] (1-2 muscles), [phone] (3+ muscles)- Dry Needling, Patient/Family education, Balance training, Stair training, Joint mobilization, Joint manipulation, Spinal manipulation, Spinal mobilization, Vestibular training, DME instructions, Wheelchair mobility training, Cryotherapy, and Moist heat.  PLAN FOR NEXT SESSION:  -expand HEP -manual therapy of thoracic and lumbar musculature -isolate treatment focus with patient -choose appropriate functional measures -choose more appropriate patient goals based upon further examination   Renna Helling, SPT 10/23/2024, 4:35 PM   I have read and reviewed the attached note and am in agreement with the documentation provided.     This licensed clinician was present and actively directing care throughout the session at all times.   Massie Dollar PT, DPT  Physical Therapist - [place]   [facility]  5:54 PM 10/23/2024

## 2024-10-25 ENCOUNTER — Ambulatory Visit
Admission: RE | Admit: 2024-10-25 | Discharge: 2024-10-25 | Disposition: A | Discharge status: Home / Self Care | Source: Ambulatory Visit | Attending: Nurse Practitioner | Admitting: Nurse Practitioner

## 2024-10-25 DIAGNOSIS — M25551 Pain in right hip: Secondary | ICD-10-CM | POA: Insufficient documentation

## 2024-10-25 DIAGNOSIS — M1611 Unilateral primary osteoarthritis, right hip: Secondary | ICD-10-CM | POA: Insufficient documentation

## 2024-10-25 DIAGNOSIS — G8929 Other chronic pain: Secondary | ICD-10-CM | POA: Diagnosis present

## 2024-10-27 ENCOUNTER — Ambulatory Visit

## 2024-10-27 ENCOUNTER — Other Ambulatory Visit: Payer: Self-pay

## 2024-10-27 DIAGNOSIS — G894 Chronic pain syndrome: Secondary | ICD-10-CM

## 2024-10-27 DIAGNOSIS — M5414 Radiculopathy, thoracic region: Secondary | ICD-10-CM

## 2024-10-27 DIAGNOSIS — G8929 Other chronic pain: Secondary | ICD-10-CM

## 2024-10-27 DIAGNOSIS — M25551 Pain in right hip: Secondary | ICD-10-CM

## 2024-10-27 DIAGNOSIS — M546 Pain in thoracic spine: Secondary | ICD-10-CM

## 2024-10-27 DIAGNOSIS — M542 Cervicalgia: Secondary | ICD-10-CM

## 2024-10-27 DIAGNOSIS — R200 Anesthesia of skin: Secondary | ICD-10-CM

## 2024-10-27 NOTE — Therapy (Signed)
 " OUTPATIENT PHYSICAL THERAPY THORACOLUMBAR TREATMENT   Patient Name: Nicole Walter MRN: [id number] DOB:01-14-1961, 63 y.o., female Today's Date: 10/27/2024  END OF SESSION:  PT End of Session - 10/27/24 0907     Visit Number 2    Number of Visits 24    Progress Note Due on Visit 10    PT Start Time 0849    PT Stop Time 0930    PT Time Calculation (min) 41 min    Activity Tolerance Patient tolerated treatment well    Behavior During Therapy [facility] for tasks assessed/performed          Past Medical History:  Diagnosis Date   Abnormal liver enzymes 02/08/2016   Allergy    Anxiety    Chest pain 07/05/2016   Chronic pain    Depression    DJD (degenerative joint disease)    lumbar   Fibromyalgia    High cholesterol    Rash of back 01/28/2016   Weight loss due to medication    Past Surgical History:  Procedure Laterality Date   ANKLE SURGERY     x 2   BREAST BIOPSY Left 1997   CHOLECYSTECTOMY     COLONOSCOPY     COLONOSCOPY WITH PROPOFOL  N/A 02/22/2016   Procedure: COLONOSCOPY WITH PROPOFOL ;  Surgeon: Gladis RAYMOND Mariner, MD;  Location: [facility] ENDOSCOPY;  Service: Endoscopy;  Laterality: N/A;   ESOPHAGOGASTRODUODENOSCOPY (EGD) WITH PROPOFOL  N/A 02/22/2016   Procedure: ESOPHAGOGASTRODUODENOSCOPY (EGD) WITH PROPOFOL ;  Surgeon: Gladis RAYMOND Mariner, MD;  Location: [facility] ENDOSCOPY;  Service: Endoscopy;  Laterality: N/A;   NASAL SINUS SURGERY     SHOULDER ARTHROSCOPY WITH ROTATOR CUFF REPAIR AND SUBACROMIAL DECOMPRESSION Left 09/23/2019   Procedure: SHOULDER ARTHROSCOPY WITH MINI ROTATOR CUFF REPAIR AND SUBACROMIAL DECOMPRESSION, BICEPS TENODESIS;  Surgeon: Tobie Priest, MD;  Location: [facility]ERY [facility];  Service: Orthopedics;  Laterality: Left;   TONSILLECTOMY     TUBAL LIGATION     Patient Active Problem List   Diagnosis Date Noted   Other intervertebral disc degeneration, thoracic region 09/30/2024   Primary osteoarthritis of right hip 09/30/2024   Postmenopausal bleeding  07/29/2024   Dyspepsia 10/15/2023   Hot flash, menopausal 09/03/2023   Chronic bronchitis (HCC) 09/03/2023   Piriformis syndrome of right side 08/20/2023   Oral thrush 05/15/2023   Acute maxillary sinusitis 05/15/2023   Chronic radicular lumbar pain 03/20/2023   Exposure to sexually transmitted disease (STD) 01/25/2023   Acute vaginitis 01/25/2023   Abnormal cervical Papanicolaou smear 01/25/2023   Abnormal MRI, cervical spine 07/22/2019   Neurogenic pain 07/22/2019   DDD (degenerative disc disease), cervical 06/09/2019   Chronic musculoskeletal pain 05/14/2019   Needle phobia 09/26/2018   Spondylosis without myelopathy or radiculopathy, lumbar region 08/29/2018   Lumbar facet hypertrophy 08/29/2018   Lumbar facet syndrome (Right) 08/29/2018   DDD (degenerative disc disease), thoracic 08/29/2018   Thoracic spondylosis with radiculopathy (Right) 08/29/2018   Spinal enthesopathy of lumbar region 08/29/2018   Chronic midline low back pain without sciatica 08/29/2018   Generalized anxiety disorder 07/23/2018   Cervical facet hypertrophy (Bilateral) 07/18/2018   Cervical central spinal stenosis 07/18/2018   Chronic shoulder pain (Left) 07/09/2018   Cervicalgia (Bilateral) (L>R) 03/28/2018   Spondylosis without myelopathy or radiculopathy, cervical region 02/11/2018   Chronic pelvic pain in female 08/24/2017   Fatty liver 08/24/2017   Chronic low back pain (Bilateral) (L>R) 08/13/2017   Foraminal stenosis of cervical region 08/13/2017   Chronic low back pain 08/13/2017   Cervical sensory  radiculopathy at C5 (Bilateral) 08/13/2017   Cervical radicular pain 08/13/2017   Constipation 07/16/2017   Myofascial pain syndrome, cervical (trapezius) (Left) 05/28/2017   Cervical facet syndrome (Bilateral) (L>R) 05/28/2017   Muscle spasm 05/15/2017   Chronic hip pain (Right) 03/19/2017   Chronic knee arthropathy (Right) 03/14/2017   Osteoarthritis of knee (Right) 02/21/2017   Osteoarthritis  of shoulder (Right) 01/31/2017   Shortness of breath 01/15/2017   Thoracic spondylosis 01/01/2017   Radicular pain of thoracic region 12/21/2016   Osteoarthritis of hip (Bilateral) 11/20/2016   Greater trochanteric bursitis (Right) 09/18/2016   Subacromial bursitis of shoulder joint (Right) 09/18/2016   Overweight (BMI 25.0-29.9) 09/14/2016   ESR raised 08/31/2016   Elevated C-reactive protein (CRP) 08/31/2016   Long term (current) use of opiate analgesic 07/20/2016   Long term prescription opiate use 07/20/2016   Opiate use 07/20/2016   Chronic upper back pain (Secondary Area of Pain) (Bilateral) (L>R) 07/20/2016   Chronic shoulder pain (Tertiary Area of Pain) (Bilateral) (L>R) 07/20/2016   Chronic knee pain (Right) 07/20/2016   Chronic upper extremity pain (Left) 07/20/2016   Chronic lower extremity pain (Bilateral) (L>R) 07/20/2016   Chronic abdominal pain 07/20/2016   Chronic hip pain (Bilateral) (R>L) 05/31/2016   Chronic Greater trochanteric bursitis (Bilateral) (L>R) 05/31/2016   Bursitis of shoulder (Right) 05/17/2016   Skin lesions 04/27/2016   Hyperlipidemia 02/18/2016   Cervical spondylosis (Bilateral) 02/08/2016   Fibromyalgia 02/08/2016   Osteopenia 02/08/2016   Chronic neck pain (Primary Area of Pain) (Bilateral) (L>R) 01/28/2016   Concentration deficit 01/28/2016   Routine history and physical examination of adult 01/14/2016   Lumbar spondylosis 01/06/2016   Anxiety and depression 07/11/2015   Chronic pain syndrome 07/11/2015   Combined fat and carbohydrate induced hyperlipemia 07/11/2015    PCP: Albina GORMAN Dine, MD   REFERRING PROVIDER: Tobie Emmy POUR, NP   REFERRING DIAG:  M54.14 (ICD-10-CM) - Radicular pain of thoracic region M19.011 (ICD-10-CM) - Primary osteoarthritis of right shoulder G89.4 (ICD-10-CM) - Chronic pain syndrome M47.894 (ICD-10-CM) - Other spondylosis, thoracic region M51.34 (ICD-10-CM) - Other intervertebral disc degeneration,  thoracic region   Rationale for Evaluation and Treatment: Rehabilitation  THERAPY DIAG:  Chronic pain syndrome  Pain in thoracic spine  Radiculopathy, thoracic region  Numbness in both hands  Chronic pain of both shoulders  Cervicalgia  Pain in right hip  ONSET DATE: since around 2015 it's gotten worse  SUBJECTIVE:  SUBJECTIVE STATEMENT: From Today: Patient reports feeling about the same- painful right side upper back around scapula, Right shoulder and pain radiating around Right side post to anterior along rib line and pain from low back down into Right hip. States she feels insurance would not approve another MRI as she did not have PT specifically for thoracic issue.   From Eval: Pt says her pain is in the R side her body, traveling from her cervical spine, down midline towards the thoracic region, and down into her quadratus lumborum/ hip region. Pt reports pain even being caused by pressure from her waistband against the muscle tissue. She reports the worst pain she experiences is in her R hip, like someone is trying to pry it apart. Reports minor relief in R cervical pain and R hip pain following last bout of physical therapy. Thoracic pain is also reported as main concern, as well as numbness in middle three fingers on both hands. Also reports seeing a chronic pain doctor, and that she is diagnosed with fibromyalgia. Reports finding minimal relief with multiple rounds of steroid injections. Says she is trying to wean herself off of the pain medications as well.  PERTINENT HISTORY:  From recent progress note with referring provider: Ms. Taletha Twiford, a 63 y.o. year old female, is here today because of her Primary osteoarthritis of right hip [M16.11]. Ms. Kutscher primary complain today is  Neck Pain   Pertinent problems: Ms. Eddleman has Chronic pain syndrome; Lumbar spondylosis; Chronic neck pain (Primary Area of Pain) (Bilateral) (L>R); Cervical spondylosis (Bilateral); Fibromyalgia; Chronic hip pain (Bilateral) (R>L); Chronic Greater trochanteric bursitis (Bilateral) (L>R); Long term prescription opiate use; Opiate use; Chronic upper back pain (Secondary Area of Pain) (Bilateral) (L>R); Chronic shoulder pain (Tertiary Area of Pain) (Bilateral) (L>R); Chronic knee pain (Right); Chronic upper extremity pain (Left); Chronic lower extremity pain (Bilateral) (L>R); Osteoarthritis of hip (Bilateral); Thoracic spondylosis; Osteoarthritis of shoulder (Right); Osteoarthritis of knee (Right); Chronic knee arthropathy (Right); Chronic hip pain (Right); Myofascial pain syndrome, cervical (trapezius) (Left); Cervical facet syndrome (Bilateral) (L>R); Chronic low back pain (Bilateral) (L>R); Cervical sensory radiculopathy at C5 (Bilateral); Spondylosis without myelopathy or radiculopathy, cervical region; Cervicalgia (Bilateral) (L>R); and Chronic shoulder pain (Left) on their pertinent problem list.  PAIN:  Are you having pain? Yes: NPRS scale: < 5/10 usually Pain location: pain pathway as described above in subjective Pain description: burning, achy, stretched muscle sensation Aggravating factors: worse when she is sleeping, worse when she is on her side, bending side to side Relieving factors: Used to do ice, now taking ibuprofen  and pain medication, which doesn't help much  PRECAUTIONS: None  RED FLAGS: None   WEIGHT BEARING RESTRICTIONS: No  FALLS:  Has patient fallen in last 6 months? 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  NEXT MD VISIT: N/A, looking to get approved for an MRI appointment of the R shoulder to see if further medical intervention is needed  OBJECTIVE:  Note: Objective measures were completed at Evaluation unless otherwise noted.  DIAGNOSTIC FINDINGS:  From Thoracic Spine MRI 08/27/23: MRI THORACIC SPINE FINDINGS   Alignment: No listhesis. In  mild levocurvature of the upper thoracic spine with dextrocurvature of the lower thoracic spine and thoracolumbar junction. Straightening of the normal thoracic kyphosis.   Vertebrae: No acute fracture, evidence of discitis, or suspicious osseous lesion.   Cord:  Normal signal and morphology.   Paraspinal and other soft tissues: Negative.   Disc levels:   T1-T2: Facet arthropathy. No spinal canal stenosis. Moderate to severe right and moderate left neural foraminal narrowing, unchanged.   T2-T3: Right eccentric disc bulge. Right greater than left facet arthropathy. No spinal canal stenosis. Severe right neural foraminal narrowing, unchanged.   T3-T4: Right eccentric disc bulge. Right greater than facet arthropathy. No spinal canal stenosis. Moderate right neural foraminal narrowing.   T4-T5: Facet arthropathy. No spinal canal stenosis or neural foraminal narrowing.   T5-T6: Left paracentral disc protrusion. No spinal canal stenosis or neural foraminal narrowing.   T6-T7: Small central disc protrusion. No spinal canal stenosis or neural foraminal narrowing.   T7-T8: Small right subarticular disc protrusion. No spinal canal stenosis or neural foraminal narrowing.   T8-T9: Facet arthropathy. No spinal canal stenosis or neural foraminal narrowing.   T9-T10: Moderate facet arthropathy. No spinal canal stenosis. Mild bilateral neural foraminal narrowing.   T10-T11: Moderate to severe facet arthropathy. No spinal  canal stenosis. Mild bilateral neural foraminal narrowing.   T11-T12: Moderate facet arthropathy. No spinal canal stenosis. Mild left neural foraminal narrowing.    PATIENT SURVEYS:  At next session*  COGNITION: Overall cognitive status: Within functional limits for tasks assessed     SENSATION: Light touch: Impaired  Gross R UE decreased compared to L, numbness reported in middle three fingers of R hand occasionally    POSTURE: rounded shoulders, forward head, increased lumbar lordosis, and decreased thoracic kyphosis  PALPATION: Multiple tight mm tissue bands and trigger points found upon palpation of pt posterior trunk: B UT tightness, R rhomboid trigger points/midline of scapula, gross paraspinal mm guarding and trigger points from cervical to lumbar region, R lower back tightness,painful to the touch  UE MMT:  R shoulder flexion: 5 L shoulder flexion: 5  R shoulder abduction: 4+, sharp pain up to shoulder from arm L shoulder abduction: 5  R shoulder IR: 4+ L shoulder IR: 4, sharp pain  R shoulder ER: 4+, sharp pain L shoulder ER: 5  UE ROM: All WFL, slight discomfort but no pain   LUMBAR ROM:   AROM eval  Flexion Fingertips to mid shin, sharp pain when max range reached in center of thoracolumbar junction and on R lower lumber  Extension WFL, no pain  Right lateral flexion Fingertips to top of knee cap, sharp pain on R side  Left lateral flexion Fingertips to mid knee cap, pain on R side  Right rotation 55*, no pain  Left rotation 62*, no pain   (Blank rows = not tested)  LOWER EXTREMITY ROM:     Active  Right eval Left eval  Hip flexion    Hip extension    Hip abduction    Hip adduction    Hip internal rotation    Hip external rotation    Knee flexion    Knee extension    Ankle dorsiflexion    Ankle plantarflexion    Ankle inversion    Ankle eversion     (Blank rows = not tested)  LOWER EXTREMITY MMT:    MMT Right eval Left eval  Hip  flexion    Hip extension    Hip abduction  Hip adduction    Hip internal rotation    Hip external rotation    Knee flexion    Knee extension    Ankle dorsiflexion    Ankle plantarflexion    Ankle inversion    Ankle eversion     (Blank rows = not tested)  LUMBAR SPECIAL TESTS:  None at this session.  FUNCTIONAL TESTS:  Next session.   TREATMENT DATE: 10/27/2024   Manual therapy:  Mobilization with movement- inf glide GH with passive shoulder flex and abd x several min.  PA/AP gh mobs grade 3 x 30 oscillation  PA thoracic mobs- grade 3-4 SMT to thoracic paraspinals (R side)   Therex:  Prone y 2 x 10 each UE Prone row 2 x 10 reps each PROM to R shoulder - all planes x 20+ reps     Quick Dash:    Q+[id number] DASH      Please rate your ability do the following activities in the last week by selecting the number below the appropriate response.     Activities Rating  Open a tight or new jar.  3  Do heavy household chores (e.g., wash walls, floors). 2  Carry a shopping bag or briefcase 2  Wash your back. 1  Use a knife to cut food. 1  Recreational activities in which you take some force or impact through your arm, shoulder or hand (e.g., golf, hammering, tennis, etc.). 3  During the past week, to what extent has your arm, shoulder or hand problem interfered with your normal social activities with family, friends, neighbors or groups?  1  During the past week, were you limited in your work or other regular daily activities as a result of your arm, shoulder or hand problem? 1  Rate the severity of the following symptoms in the last week: Arm, Shoulder, or hand pain. 3  Rate the severity of the following symptoms in the last week: Tingling (pins and needles) in your arm, shoulder or hand. 2  During the past week, how much difficulty have you had sleeping because of the pain in your arm, shoulder or hand?  3   22  (A QuickDASH score may not be calculated if there is  greater than 1 missing item.) 2   1  Quick Dash Disability/Symptom Score: [(sum of  (n) responses/ (n)] x 25 =  25     Minimally Clinically Important Difference (MCID): 15-20 points                                                                                PATIENT EDUCATION:  Education details: Educated on causation of symptoms, past MRI imaging, HEP exercise, Person educated: Patient Education method: Demonstration Education comprehension: verbalized understanding  HOME EXERCISE PROGRAM:  Access Code: [email or web address] URL: [email or web address] Date: 10/23/2024 Prepared by: Massie Dollar  Exercises - Standing with Forearms Thoracic Rotation  - 1 x daily - 7 x weekly - 3 sets - 10 reps - 2-3 second  hold  ASSESSMENT: CLINICAL IMPRESSION: Patient is a 63 y.o. female who was seen today for physical therapy treatment for radicular pain, intervertebral disc degeneration, and spondylosis of the thoracic spine. Patient has multiple  areas of pain- focused on Right shoulder and thoraic spine today. She presents very guarded with some difficulty relaxing due pain. Some mm guarding and trigger points noted again along right side thoracic and shoulder the lumbar spine.  Will focus on these areas initially to see if benefit from PT interventions.  Added a quick dash goal to focus on RUE function. Pt will continue to benefit from skilled therapy to address remaining deficits in order to improve overall QoL and return to PLOF.    OBJECTIVE IMPAIRMENTS: decreased activity tolerance, decreased mobility, decreased ROM, decreased strength, hypomobility, increased fascial restrictions, impaired perceived functional ability, impaired flexibility, impaired sensation, impaired UE functional use, improper body mechanics, postural dysfunction, and pain.   ACTIVITY LIMITATIONS: sleeping, stairs, and bed mobility  PARTICIPATION LIMITATIONS: medication management and occupation  PERSONAL FACTORS:  Age, Past/current experiences, Time since onset of injury/illness/exacerbation, and 3+ comorbidities: chronic pain syndrome, fibromyalgia, depression, DJD are also affecting patient's functional outcome.   REHAB POTENTIAL: Fair Pt has complex medical history including chronic pain syndrome and fibromyalgia, and has not found relief for success with a few previous bouts of therapy, or medication management.  CLINICAL DECISION MAKING: Evolving/moderate complexity  EVALUATION COMPLEXITY: Moderate   GOALS: Goals reviewed with patient? No  SHORT TERM GOALS: Target date: 11/20/2024  Pt will be independent with HEP in order to decrease thoracolumbar pain in order to improve pain-free function at home.  Baseline: Goal status: INITIAL   LONG TERM GOALS: Target date: 01/15/2025   Pt will decrease worst pain as reported on NPRS by at least two points in order to demonstrate clinically significant reduction in thoracolumbar pain. Baseline: 5-6/10 Goal status: INITIAL  2.  Pt will be able to increase active lumbar ROM with no tightness or sharp pains in order to be able to move freely and perform functional tasks for daily living. Baseline: See chart above. Goal status: INITIAL  Pt will decrease quick DASH score by at least 8% in order to demonstrate clinically significant reduction in disability. Baseline: 10/27/2024= 25  Goal status: New   PLAN:  PT FREQUENCY: 2x/week  PT DURATION: 12 weeks  PLANNED INTERVENTIONS: 97164- PT Re-evaluation, 97750- Physical Performance Testing, 97110-Therapeutic exercises, 97530- Therapeutic activity, 97112- Neuromuscular re-education, 97535- Self Care, [id number]- Manual therapy, [phone]- Gait training, [phone]- Orthotic Initial, [phone]- Orthotic/Prosthetic subsequent, [phone]- Aquatic Therapy, [phone] (1-2 muscles), [phone] (3+ muscles)- Dry Needling, Patient/Family education, Balance training, Stair training, Joint mobilization, Joint manipulation, Spinal manipulation,  Spinal mobilization, Vestibular training, DME instructions, Wheelchair mobility training, Cryotherapy, and Moist heat.  PLAN FOR NEXT SESSION:  -expand HEP -manual therapy of thoracic and R shoulder musculature -isolate treatment focus with patient   Chyrl London, PT Physical Therapist  [place] [facility] 10/27/2024, 10:42 AM    "

## 2024-10-29 ENCOUNTER — Ambulatory Visit: Admitting: Physical Therapy

## 2024-11-03 ENCOUNTER — Telehealth: Payer: Self-pay | Admitting: Nurse Practitioner

## 2024-11-03 ENCOUNTER — Ambulatory Visit

## 2024-11-03 ENCOUNTER — Other Ambulatory Visit

## 2024-11-03 DIAGNOSIS — M542 Cervicalgia: Secondary | ICD-10-CM

## 2024-11-03 DIAGNOSIS — M546 Pain in thoracic spine: Secondary | ICD-10-CM

## 2024-11-03 DIAGNOSIS — G894 Chronic pain syndrome: Secondary | ICD-10-CM

## 2024-11-03 DIAGNOSIS — R200 Anesthesia of skin: Secondary | ICD-10-CM

## 2024-11-03 DIAGNOSIS — M5414 Radiculopathy, thoracic region: Secondary | ICD-10-CM

## 2024-11-03 DIAGNOSIS — G8929 Other chronic pain: Secondary | ICD-10-CM

## 2024-11-03 DIAGNOSIS — M25551 Pain in right hip: Secondary | ICD-10-CM | POA: Diagnosis not present

## 2024-11-03 NOTE — Telephone Encounter (Signed)
 Patient has been going to PT and wants to see if the MRI's can be approved now. Please advise patient

## 2024-11-03 NOTE — Therapy (Signed)
 " OUTPATIENT PHYSICAL THERAPY THORACOLUMBAR TREATMENT   Patient Name: Nicole Walter MRN: [id number] DOB:12/15/1960, 63 y.o., female Today's Date: 11/04/2024  END OF SESSION:  PT End of Session - 11/03/24 1306     Visit Number 3    Number of Visits 24    Progress Note Due on Visit 10    PT Start Time 1315    PT Stop Time 1358    PT Time Calculation (min) 43 min    Activity Tolerance Patient tolerated treatment well    Behavior During Therapy [facility] for tasks assessed/performed           Past Medical History:  Diagnosis Date   Abnormal liver enzymes 02/08/2016   Allergy    Anxiety    Chest pain 07/05/2016   Chronic pain    Depression    DJD (degenerative joint disease)    lumbar   Fibromyalgia    High cholesterol    Rash of back 01/28/2016   Weight loss due to medication    Past Surgical History:  Procedure Laterality Date   ANKLE SURGERY     x 2   BREAST BIOPSY Left 1997   CHOLECYSTECTOMY     COLONOSCOPY     COLONOSCOPY WITH PROPOFOL  N/A 02/22/2016   Procedure: COLONOSCOPY WITH PROPOFOL ;  Surgeon: Gladis RAYMOND Mariner, MD;  Location: [facility] ENDOSCOPY;  Service: Endoscopy;  Laterality: N/A;   ESOPHAGOGASTRODUODENOSCOPY (EGD) WITH PROPOFOL  N/A 02/22/2016   Procedure: ESOPHAGOGASTRODUODENOSCOPY (EGD) WITH PROPOFOL ;  Surgeon: Gladis RAYMOND Mariner, MD;  Location: [facility] ENDOSCOPY;  Service: Endoscopy;  Laterality: N/A;   NASAL SINUS SURGERY     SHOULDER ARTHROSCOPY WITH ROTATOR CUFF REPAIR AND SUBACROMIAL DECOMPRESSION Left 09/23/2019   Procedure: SHOULDER ARTHROSCOPY WITH MINI ROTATOR CUFF REPAIR AND SUBACROMIAL DECOMPRESSION, BICEPS TENODESIS;  Surgeon: Tobie Priest, MD;  Location: [facility];  Service: Orthopedics;  Laterality: Left;   TONSILLECTOMY     TUBAL LIGATION     Patient Active Problem List   Diagnosis Date Noted   Other intervertebral disc degeneration, thoracic region 09/30/2024   Primary osteoarthritis of right hip 09/30/2024   Postmenopausal bleeding  07/29/2024   Dyspepsia 10/15/2023   Hot flash, menopausal 09/03/2023   Chronic bronchitis (HCC) 09/03/2023   Piriformis syndrome of right side 08/20/2023   Oral thrush 05/15/2023   Acute maxillary sinusitis 05/15/2023   Chronic radicular lumbar pain 03/20/2023   Exposure to sexually transmitted disease (STD) 01/25/2023   Acute vaginitis 01/25/2023   Abnormal cervical Papanicolaou smear 01/25/2023   Abnormal MRI, cervical spine 07/22/2019   Neurogenic pain 07/22/2019   DDD (degenerative disc disease), cervical 06/09/2019   Chronic musculoskeletal pain 05/14/2019   Needle phobia 09/26/2018   Spondylosis without myelopathy or radiculopathy, lumbar region 08/29/2018   Lumbar facet hypertrophy 08/29/2018   Lumbar facet syndrome (Right) 08/29/2018   DDD (degenerative disc disease), thoracic 08/29/2018   Thoracic spondylosis with radiculopathy (Right) 08/29/2018   Spinal enthesopathy of lumbar region 08/29/2018   Chronic midline low back pain without sciatica 08/29/2018   Generalized anxiety disorder 07/23/2018   Cervical facet hypertrophy (Bilateral) 07/18/2018   Cervical central spinal stenosis 07/18/2018   Chronic shoulder pain (Left) 07/09/2018   Cervicalgia (Bilateral) (L>R) 03/28/2018   Spondylosis without myelopathy or radiculopathy, cervical region 02/11/2018   Chronic pelvic pain in female 08/24/2017   Fatty liver 08/24/2017   Chronic low back pain (Bilateral) (L>R) 08/13/2017   Foraminal stenosis of cervical region 08/13/2017   Chronic low back pain 08/13/2017   Cervical  sensory radiculopathy at C5 (Bilateral) 08/13/2017   Cervical radicular pain 08/13/2017   Constipation 07/16/2017   Myofascial pain syndrome, cervical (trapezius) (Left) 05/28/2017   Cervical facet syndrome (Bilateral) (L>R) 05/28/2017   Muscle spasm 05/15/2017   Chronic hip pain (Right) 03/19/2017   Chronic knee arthropathy (Right) 03/14/2017   Osteoarthritis of knee (Right) 02/21/2017   Osteoarthritis  of shoulder (Right) 01/31/2017   Shortness of breath 01/15/2017   Thoracic spondylosis 01/01/2017   Radicular pain of thoracic region 12/21/2016   Osteoarthritis of hip (Bilateral) 11/20/2016   Greater trochanteric bursitis (Right) 09/18/2016   Subacromial bursitis of shoulder joint (Right) 09/18/2016   Overweight (BMI 25.0-29.9) 09/14/2016   ESR raised 08/31/2016   Elevated C-reactive protein (CRP) 08/31/2016   Long term (current) use of opiate analgesic 07/20/2016   Long term prescription opiate use 07/20/2016   Opiate use 07/20/2016   Chronic upper back pain (Secondary Area of Pain) (Bilateral) (L>R) 07/20/2016   Chronic shoulder pain (Tertiary Area of Pain) (Bilateral) (L>R) 07/20/2016   Chronic knee pain (Right) 07/20/2016   Chronic upper extremity pain (Left) 07/20/2016   Chronic lower extremity pain (Bilateral) (L>R) 07/20/2016   Chronic abdominal pain 07/20/2016   Chronic hip pain (Bilateral) (R>L) 05/31/2016   Chronic Greater trochanteric bursitis (Bilateral) (L>R) 05/31/2016   Bursitis of shoulder (Right) 05/17/2016   Skin lesions 04/27/2016   Hyperlipidemia 02/18/2016   Cervical spondylosis (Bilateral) 02/08/2016   Fibromyalgia 02/08/2016   Osteopenia 02/08/2016   Chronic neck pain (Primary Area of Pain) (Bilateral) (L>R) 01/28/2016   Concentration deficit 01/28/2016   Routine history and physical examination of adult 01/14/2016   Lumbar spondylosis 01/06/2016   Anxiety and depression 07/11/2015   Chronic pain syndrome 07/11/2015   Combined fat and carbohydrate induced hyperlipemia 07/11/2015    PCP: Albina GORMAN Dine, MD   REFERRING PROVIDER: Tobie Emmy POUR, NP   REFERRING DIAG:  M54.14 (ICD-10-CM) - Radicular pain of thoracic region M19.011 (ICD-10-CM) - Primary osteoarthritis of right shoulder G89.4 (ICD-10-CM) - Chronic pain syndrome M47.894 (ICD-10-CM) - Other spondylosis, thoracic region M51.34 (ICD-10-CM) - Other intervertebral disc degeneration,  thoracic region   Rationale for Evaluation and Treatment: Rehabilitation  THERAPY DIAG:  Chronic pain syndrome  Pain in thoracic spine  Numbness in both hands  Radiculopathy, thoracic region  Chronic pain of both shoulders  Cervicalgia  ONSET DATE: since around 2015 it's gotten worse  SUBJECTIVE:                                                                                                                                                                                           SUBJECTIVE  STATEMENT: From Today: Patient reports cold rainy weather intensifies her pain. Reports felt a pop in thoracic back with some improvement this morning. Another pop in my neck approx 2 min later.    From Eval: Pt says her pain is in the R side her body, traveling from her cervical spine, down midline towards the thoracic region, and down into her quadratus lumborum/ hip region. Pt reports pain even being caused by pressure from her waistband against the muscle tissue. She reports the worst pain she experiences is in her R hip, like someone is trying to pry it apart. Reports minor relief in R cervical pain and R hip pain following last bout of physical therapy. Thoracic pain is also reported as main concern, as well as numbness in middle three fingers on both hands. Also reports seeing a chronic pain doctor, and that she is diagnosed with fibromyalgia. Reports finding minimal relief with multiple rounds of steroid injections. Says she is trying to wean herself off of the pain medications as well.  PERTINENT HISTORY:  From recent progress note with referring provider: Ms. Mirakle Tomlin, a 63 y.o. year old female, is here today because of her Primary osteoarthritis of right hip [M16.11]. Ms. Formisano primary complain today is Neck Pain   Pertinent problems: Ms. Zook has Chronic pain syndrome; Lumbar spondylosis; Chronic neck pain (Primary Area of Pain) (Bilateral) (L>R); Cervical spondylosis  (Bilateral); Fibromyalgia; Chronic hip pain (Bilateral) (R>L); Chronic Greater trochanteric bursitis (Bilateral) (L>R); Long term prescription opiate use; Opiate use; Chronic upper back pain (Secondary Area of Pain) (Bilateral) (L>R); Chronic shoulder pain (Tertiary Area of Pain) (Bilateral) (L>R); Chronic knee pain (Right); Chronic upper extremity pain (Left); Chronic lower extremity pain (Bilateral) (L>R); Osteoarthritis of hip (Bilateral); Thoracic spondylosis; Osteoarthritis of shoulder (Right); Osteoarthritis of knee (Right); Chronic knee arthropathy (Right); Chronic hip pain (Right); Myofascial pain syndrome, cervical (trapezius) (Left); Cervical facet syndrome (Bilateral) (L>R); Chronic low back pain (Bilateral) (L>R); Cervical sensory radiculopathy at C5 (Bilateral); Spondylosis without myelopathy or radiculopathy, cervical region; Cervicalgia (Bilateral) (L>R); and Chronic shoulder pain (Left) on their pertinent problem list.  PAIN:  Are you having pain? Yes: NPRS scale: < 5/10 usually Pain location: pain pathway as described above in subjective Pain description: burning, achy, stretched muscle sensation Aggravating factors: worse when she is sleeping, worse when she is on her side, bending side to side Relieving factors: Used to do ice, now taking ibuprofen  and pain medication, which doesn't help much  PRECAUTIONS: None  RED FLAGS: None   WEIGHT BEARING RESTRICTIONS: No  FALLS:  Has patient fallen in last 6 months? 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  NEXT MD VISIT: N/A, looking to get approved for an MRI  appointment of the R shoulder to see if further medical intervention is needed  OBJECTIVE:  Note: Objective measures were completed at Evaluation unless otherwise noted.  DIAGNOSTIC FINDINGS:  From Thoracic Spine MRI 08/27/23: MRI THORACIC SPINE FINDINGS   Alignment: No listhesis. In mild levocurvature of the upper thoracic spine with dextrocurvature of the lower thoracic spine and thoracolumbar junction. Straightening of the normal thoracic kyphosis.   Vertebrae:  No acute fracture, evidence of discitis, or suspicious osseous lesion.   Cord:  Normal signal and morphology.   Paraspinal and other soft tissues: Negative.   Disc levels:   T1-T2: Facet arthropathy. No spinal canal stenosis. Moderate to severe right and moderate left neural foraminal narrowing, unchanged.   T2-T3: Right eccentric disc bulge. Right greater than left facet arthropathy. No spinal canal stenosis. Severe right neural foraminal narrowing, unchanged.   T3-T4: Right eccentric disc bulge. Right greater than facet arthropathy. No spinal canal stenosis. Moderate right neural foraminal narrowing.   T4-T5: Facet arthropathy. No spinal canal stenosis or neural foraminal narrowing.   T5-T6: Left paracentral disc protrusion. No spinal canal stenosis or neural foraminal narrowing.   T6-T7: Small central disc protrusion. No spinal canal stenosis or neural foraminal narrowing.   T7-T8: Small right subarticular disc protrusion. No spinal canal stenosis or neural foraminal narrowing.   T8-T9: Facet arthropathy. No spinal canal stenosis or neural foraminal narrowing.   T9-T10: Moderate facet arthropathy. No spinal canal stenosis. Mild bilateral neural foraminal narrowing.   T10-T11: Moderate to severe facet arthropathy. No spinal canal stenosis. Mild bilateral neural foraminal narrowing.   T11-T12: Moderate facet arthropathy. No spinal canal stenosis. Mild left neural foraminal narrowing.    PATIENT  SURVEYS:  At next session*  COGNITION: Overall cognitive status: Within functional limits for tasks assessed     SENSATION: Light touch: Impaired  Gross R UE decreased compared to L, numbness reported in middle three fingers of R hand occasionally    POSTURE: rounded shoulders, forward head, increased lumbar lordosis, and decreased thoracic kyphosis  PALPATION: Multiple tight mm tissue bands and trigger points found upon palpation of pt posterior trunk: B UT tightness, R rhomboid trigger points/midline of scapula, gross paraspinal mm guarding and trigger points from cervical to lumbar region, R lower back tightness,painful to the touch  UE MMT:  R shoulder flexion: 5 L shoulder flexion: 5  R shoulder abduction: 4+, sharp pain up to shoulder from arm L shoulder abduction: 5  R shoulder IR: 4+ L shoulder IR: 4, sharp pain  R shoulder ER: 4+, sharp pain L shoulder ER: 5  UE ROM: All WFL, slight discomfort but no pain   LUMBAR ROM:   AROM eval  Flexion Fingertips to mid shin, sharp pain when max range reached in center of thoracolumbar junction and on R lower lumber  Extension WFL, no pain  Right lateral flexion Fingertips to top of knee cap, sharp pain on R side  Left lateral flexion Fingertips to mid knee cap, pain on R side  Right rotation 55*, no pain  Left rotation 62*, no pain   (Blank rows = not tested)  LOWER EXTREMITY ROM:     Active  Right eval Left eval  Hip flexion    Hip extension    Hip abduction    Hip adduction    Hip internal rotation    Hip external rotation    Knee flexion    Knee extension    Ankle dorsiflexion    Ankle plantarflexion    Ankle inversion    Ankle eversion     (Blank rows = not tested)  LOWER EXTREMITY MMT:    MMT Right eval Left eval  Hip flexion    Hip extension    Hip abduction    Hip adduction    Hip internal rotation    Hip external rotation    Knee flexion    Knee extension  Ankle dorsiflexion    Ankle  plantarflexion    Ankle inversion    Ankle eversion     (Blank rows = not tested)  LUMBAR SPECIAL TESTS:  None at this session.  FUNCTIONAL TESTS:  Next session.  Quick Dash:  10/27/24  Q+B89:C110UICK DASH      Please rate your ability do the following activities in the last week by selecting the number below the appropriate response.     Activities Rating  Open a tight or new jar.  3  Do heavy household chores (e.g., wash walls, floors). 2  Carry a shopping bag or briefcase 2  Wash your back. 1  Use a knife to cut food. 1  Recreational activities in which you take some force or impact through your arm, shoulder or hand (e.g., golf, hammering, tennis, etc.). 3  During the past week, to what extent has your arm, shoulder or hand problem interfered with your normal social activities with family, friends, neighbors or groups?  1  During the past week, were you limited in your work or other regular daily activities as a result of your arm, shoulder or hand problem? 1  Rate the severity of the following symptoms in the last week: Arm, Shoulder, or hand pain. 3  Rate the severity of the following symptoms in the last week: Tingling (pins and needles) in your arm, shoulder or hand. 2  During the past week, how much difficulty have you had sleeping because of the pain in your arm, shoulder or hand?  3   22  (A QuickDASH score may not be calculated if there is greater than 1 missing item.) 2   1  Quick Dash Disability/Symptom Score: [(sum of  (n) responses/ (n)] x 25 =  25     Minimally Clinically Important Difference (MCID): 15-20 points                                                                               TREATMENT DATE: 11/03/2024   *Moist heat provided to patient while in supine during manual activities Manual therapy:  Mobilization with movement- R shouler inf glide GH with passive shoulder flex and abd x several min.  PA/AP gh mobs grade 3 x 30 oscillation  PA thoracic  mobs- grade 3-4 along T4-10 SMT to R UT/ middle trap/lower trap and thoracic paraspinals (R side)   Therex:  -Quadruped cat/cow x 15 reps (slow and controlled) - VC and visual cues to perform correctly -Quadruped Thoracic rotation- reach under x 15 reps each side -Quadruped alt Shoulder horz abd x 15 reps each Side -Seated self thoracic mobs into ext using back of chair- multiple levels using towel rolls - several min -Supine thoracic self mob with towel roll x several levels-  -Sidelye thoracic rotation- Open/closed book.       PATIENT EDUCATION:  Education details: Educated on causation of symptoms, past MRI imaging, HEP exercise, Person educated: Patient Education method: Demonstration Education comprehension: verbalized understanding  HOME EXERCISE PROGRAM:  Access Code: [email or web address] URL: [email or web address] Date: 11/03/2024 Prepared by: Reyes London  Exercises - Seated Thoracic Self-Mobilization  - 1 x daily - 3 sets - 10 reps - Supine Thoracic  Mobilization Towel Roll Vertical with Arm Stretch  - 1 x daily - 3 sets - 10 reps - Quadruped Thoracic Rotation - Reach Under  - 1 x daily - 3 sets - 10 reps - Sidelying Open Book Thoracic Lumbar Rotation and Extension  - 1 x daily - 3 sets - 10 reps      Access Code: [email or web address] URL: [email or web address] Date: 10/23/2024 Prepared by: Massie Dollar  Exercises - Standing with Forearms Thoracic Rotation  - 1 x daily - 7 x weekly - 3 sets - 10 reps - 2-3 second  hold  ASSESSMENT: CLINICAL IMPRESSION: Patient is a 63 y.o. female who was seen today for physical therapy treatment for radicular pain, intervertebral disc degeneration, and spondylosis of the thoracic spine. Treatment continues to focus on thoracic pain and mobility. She was instructed in several new activities for HEP today to improve her ROM with good understanding of not intensifying her pain. No increased pain immediately after session,  but patient responded with I know I worked my back out. She was not as guarded with STM to right side today as well. Patient will continue to benefit from skilled therapy to address remaining deficits in order to improve overall QoL and return to PLOF.    OBJECTIVE IMPAIRMENTS: decreased activity tolerance, decreased mobility, decreased ROM, decreased strength, hypomobility, increased fascial restrictions, impaired perceived functional ability, impaired flexibility, impaired sensation, impaired UE functional use, improper body mechanics, postural dysfunction, and pain.   ACTIVITY LIMITATIONS: sleeping, stairs, and bed mobility  PARTICIPATION LIMITATIONS: medication management and occupation  PERSONAL FACTORS: Age, Past/current experiences, Time since onset of injury/illness/exacerbation, and 3+ comorbidities: chronic pain syndrome, fibromyalgia, depression, DJD are also affecting patient's functional outcome.   REHAB POTENTIAL: Fair Pt has complex medical history including chronic pain syndrome and fibromyalgia, and has not found relief for success with a few previous bouts of therapy, or medication management.  CLINICAL DECISION MAKING: Evolving/moderate complexity  EVALUATION COMPLEXITY: Moderate   GOALS: Goals reviewed with patient? No  SHORT TERM GOALS: Target date: 11/20/2024  Pt will be independent with HEP in order to decrease thoracolumbar pain in order to improve pain-free function at home.  Baseline: Goal status: INITIAL   LONG TERM GOALS: Target date: 01/15/2025   Pt will decrease worst pain as reported on NPRS by at least two points in order to demonstrate clinically significant reduction in thoracolumbar pain. Baseline: 5-6/10 Goal status: INITIAL  2.  Pt will be able to increase active lumbar ROM with no tightness or sharp pains in order to be able to move freely and perform functional tasks for daily living. Baseline: See chart above. Goal status: INITIAL  Pt will  decrease quick DASH score by at least 8% in order to demonstrate clinically significant reduction in disability. Baseline: 10/27/2024= 25  Goal status: New   PLAN:  PT FREQUENCY: 2x/week  PT DURATION: 12 weeks  PLANNED INTERVENTIONS: 97164- PT Re-evaluation, 97750- Physical Performance Testing, 97110-Therapeutic exercises, 97530- Therapeutic activity, 97112- Neuromuscular re-education, 97535- Self Care, [id number]- Manual therapy, [phone]- Gait training, [phone]- Orthotic Initial, [phone]- Orthotic/Prosthetic subsequent, [phone]- Aquatic Therapy, [phone] (1-2 muscles), [phone] (3+ muscles)- Dry Needling, Patient/Family education, Balance training, Stair training, Joint mobilization, Joint manipulation, Spinal manipulation, Spinal mobilization, Vestibular training, DME instructions, Wheelchair mobility training, Cryotherapy, and Moist heat.  PLAN FOR NEXT SESSION:  -Continue with progress postural activities as appropriate. -manual therapy of thoracic and R shoulder musculature -Review and expand HEP   Chyrl London, PT Physical Therapist  [facility]  at [facility] 11/04/2024, 6:31 AM    "

## 2024-11-05 ENCOUNTER — Ambulatory Visit: Admitting: Physical Therapy

## 2024-11-10 ENCOUNTER — Ambulatory Visit: Attending: Student in an Organized Health Care Education/Training Program

## 2024-11-10 DIAGNOSIS — M25512 Pain in left shoulder: Secondary | ICD-10-CM | POA: Diagnosis present

## 2024-11-10 DIAGNOSIS — G894 Chronic pain syndrome: Secondary | ICD-10-CM | POA: Diagnosis present

## 2024-11-10 DIAGNOSIS — M5414 Radiculopathy, thoracic region: Secondary | ICD-10-CM | POA: Insufficient documentation

## 2024-11-10 DIAGNOSIS — G8929 Other chronic pain: Secondary | ICD-10-CM | POA: Insufficient documentation

## 2024-11-10 DIAGNOSIS — M542 Cervicalgia: Secondary | ICD-10-CM | POA: Diagnosis present

## 2024-11-10 DIAGNOSIS — M546 Pain in thoracic spine: Secondary | ICD-10-CM | POA: Insufficient documentation

## 2024-11-10 DIAGNOSIS — R200 Anesthesia of skin: Secondary | ICD-10-CM | POA: Diagnosis present

## 2024-11-10 DIAGNOSIS — M25511 Pain in right shoulder: Secondary | ICD-10-CM | POA: Insufficient documentation

## 2024-11-10 NOTE — Therapy (Signed)
 " OUTPATIENT PHYSICAL THERAPY THORACOLUMBAR TREATMENT   Patient Name: Nicole Walter MRN: [id number] DOB:December 07, 1960, 63 y.o., female Today's Date: 11/11/2024  END OF SESSION:  PT End of Session - 11/10/24 1146     Visit Number 4    Number of Visits 24    Progress Note Due on Visit 10    PT Start Time 1146    PT Stop Time 1230    PT Time Calculation (min) 44 min    Activity Tolerance Patient tolerated treatment well    Behavior During Therapy [facility] for tasks assessed/performed            Past Medical History:  Diagnosis Date   Abnormal liver enzymes 02/08/2016   Allergy    Anxiety    Chest pain 07/05/2016   Chronic pain    Depression    DJD (degenerative joint disease)    lumbar   Fibromyalgia    High cholesterol    Rash of back 01/28/2016   Weight loss due to medication    Past Surgical History:  Procedure Laterality Date   ANKLE SURGERY     x 2   BREAST BIOPSY Left 1997   CHOLECYSTECTOMY     COLONOSCOPY     COLONOSCOPY WITH PROPOFOL  N/A 02/22/2016   Procedure: COLONOSCOPY WITH PROPOFOL ;  Surgeon: Gladis RAYMOND Mariner, MD;  Location: [facility] ENDOSCOPY;  Service: Endoscopy;  Laterality: N/A;   ESOPHAGOGASTRODUODENOSCOPY (EGD) WITH PROPOFOL  N/A 02/22/2016   Procedure: ESOPHAGOGASTRODUODENOSCOPY (EGD) WITH PROPOFOL ;  Surgeon: Gladis RAYMOND Mariner, MD;  Location: [facility] ENDOSCOPY;  Service: Endoscopy;  Laterality: N/A;   NASAL SINUS SURGERY     SHOULDER ARTHROSCOPY WITH ROTATOR CUFF REPAIR AND SUBACROMIAL DECOMPRESSION Left 09/23/2019   Procedure: SHOULDER ARTHROSCOPY WITH MINI ROTATOR CUFF REPAIR AND SUBACROMIAL DECOMPRESSION, BICEPS TENODESIS;  Surgeon: Tobie Priest, MD;  Location: [facility];  Service: Orthopedics;  Laterality: Left;   TONSILLECTOMY     TUBAL LIGATION     Patient Active Problem List   Diagnosis Date Noted   Other intervertebral disc degeneration, thoracic region 09/30/2024   Primary osteoarthritis of right hip 09/30/2024   Postmenopausal bleeding  07/29/2024   Dyspepsia 10/15/2023   Hot flash, menopausal 09/03/2023   Chronic bronchitis (HCC) 09/03/2023   Piriformis syndrome of right side 08/20/2023   Oral thrush 05/15/2023   Acute maxillary sinusitis 05/15/2023   Chronic radicular lumbar pain 03/20/2023   Exposure to sexually transmitted disease (STD) 01/25/2023   Acute vaginitis 01/25/2023   Abnormal cervical Papanicolaou smear 01/25/2023   Abnormal MRI, cervical spine 07/22/2019   Neurogenic pain 07/22/2019   DDD (degenerative disc disease), cervical 06/09/2019   Chronic musculoskeletal pain 05/14/2019   Needle phobia 09/26/2018   Spondylosis without myelopathy or radiculopathy, lumbar region 08/29/2018   Lumbar facet hypertrophy 08/29/2018   Lumbar facet syndrome (Right) 08/29/2018   DDD (degenerative disc disease), thoracic 08/29/2018   Thoracic spondylosis with radiculopathy (Right) 08/29/2018   Spinal enthesopathy of lumbar region 08/29/2018   Chronic midline low back pain without sciatica 08/29/2018   Generalized anxiety disorder 07/23/2018   Cervical facet hypertrophy (Bilateral) 07/18/2018   Cervical central spinal stenosis 07/18/2018   Chronic shoulder pain (Left) 07/09/2018   Cervicalgia (Bilateral) (L>R) 03/28/2018   Spondylosis without myelopathy or radiculopathy, cervical region 02/11/2018   Chronic pelvic pain in female 08/24/2017   Fatty liver 08/24/2017   Chronic low back pain (Bilateral) (L>R) 08/13/2017   Foraminal stenosis of cervical region 08/13/2017   Chronic low back pain 08/13/2017  Cervical sensory radiculopathy at C5 (Bilateral) 08/13/2017   Cervical radicular pain 08/13/2017   Constipation 07/16/2017   Myofascial pain syndrome, cervical (trapezius) (Left) 05/28/2017   Cervical facet syndrome (Bilateral) (L>R) 05/28/2017   Muscle spasm 05/15/2017   Chronic hip pain (Right) 03/19/2017   Chronic knee arthropathy (Right) 03/14/2017   Osteoarthritis of knee (Right) 02/21/2017   Osteoarthritis  of shoulder (Right) 01/31/2017   Shortness of breath 01/15/2017   Thoracic spondylosis 01/01/2017   Radicular pain of thoracic region 12/21/2016   Osteoarthritis of hip (Bilateral) 11/20/2016   Greater trochanteric bursitis (Right) 09/18/2016   Subacromial bursitis of shoulder joint (Right) 09/18/2016   Overweight (BMI 25.0-29.9) 09/14/2016   ESR raised 08/31/2016   Elevated C-reactive protein (CRP) 08/31/2016   Long term (current) use of opiate analgesic 07/20/2016   Long term prescription opiate use 07/20/2016   Opiate use 07/20/2016   Chronic upper back pain (Secondary Area of Pain) (Bilateral) (L>R) 07/20/2016   Chronic shoulder pain (Tertiary Area of Pain) (Bilateral) (L>R) 07/20/2016   Chronic knee pain (Right) 07/20/2016   Chronic upper extremity pain (Left) 07/20/2016   Chronic lower extremity pain (Bilateral) (L>R) 07/20/2016   Chronic abdominal pain 07/20/2016   Chronic hip pain (Bilateral) (R>L) 05/31/2016   Chronic Greater trochanteric bursitis (Bilateral) (L>R) 05/31/2016   Bursitis of shoulder (Right) 05/17/2016   Skin lesions 04/27/2016   Hyperlipidemia 02/18/2016   Cervical spondylosis (Bilateral) 02/08/2016   Fibromyalgia 02/08/2016   Osteopenia 02/08/2016   Chronic neck pain (Primary Area of Pain) (Bilateral) (L>R) 01/28/2016   Concentration deficit 01/28/2016   Routine history and physical examination of adult 01/14/2016   Lumbar spondylosis 01/06/2016   Anxiety and depression 07/11/2015   Chronic pain syndrome 07/11/2015   Combined fat and carbohydrate induced hyperlipemia 07/11/2015    PCP: Albina GORMAN Dine, MD   REFERRING PROVIDER: Tobie Emmy POUR, NP   REFERRING DIAG:  M54.14 (ICD-10-CM) - Radicular pain of thoracic region M19.011 (ICD-10-CM) - Primary osteoarthritis of right shoulder G89.4 (ICD-10-CM) - Chronic pain syndrome M47.894 (ICD-10-CM) - Other spondylosis, thoracic region M51.34 (ICD-10-CM) - Other intervertebral disc degeneration,  thoracic region   Rationale for Evaluation and Treatment: Rehabilitation  THERAPY DIAG:  Chronic pain syndrome  Pain in thoracic spine  Numbness in both hands  Radiculopathy, thoracic region  Chronic pain of both shoulders  Cervicalgia  ONSET DATE: since around 2015 it's gotten worse  SUBJECTIVE:  SUBJECTIVE STATEMENT: From Today: Patient reports ongoing pain in thoracic region- unable to get comfortable and states pain still radiating around right side around toward breastbone.   From Eval: Pt says her pain is in the R side her body, traveling from her cervical spine, down midline towards the thoracic region, and down into her quadratus lumborum/ hip region. Pt reports pain even being caused by pressure from her waistband against the muscle tissue. She reports the worst pain she experiences is in her R hip, like someone is trying to pry it apart. Reports minor relief in R cervical pain and R hip pain following last bout of physical therapy. Thoracic pain is also reported as main concern, as well as numbness in middle three fingers on both hands. Also reports seeing a chronic pain doctor, and that she is diagnosed with fibromyalgia. Reports finding minimal relief with multiple rounds of steroid injections. Says she is trying to wean herself off of the pain medications as well.  PERTINENT HISTORY:  From recent progress note with referring provider: Ms. Stephanine Reas, a 83 y.o. year old female, is here today because of her Primary osteoarthritis of right hip [M16.11]. Ms. Stambaugh primary complain today is Neck Pain   Pertinent problems: Ms. Ellery has Chronic pain syndrome; Lumbar spondylosis; Chronic neck pain (Primary Area of Pain) (Bilateral) (L>R); Cervical spondylosis (Bilateral); Fibromyalgia;  Chronic hip pain (Bilateral) (R>L); Chronic Greater trochanteric bursitis (Bilateral) (L>R); Long term prescription opiate use; Opiate use; Chronic upper back pain (Secondary Area of Pain) (Bilateral) (L>R); Chronic shoulder pain (Tertiary Area of Pain) (Bilateral) (L>R); Chronic knee pain (Right); Chronic upper extremity pain (Left); Chronic lower extremity pain (Bilateral) (L>R); Osteoarthritis of hip (Bilateral); Thoracic spondylosis; Osteoarthritis of shoulder (Right); Osteoarthritis of knee (Right); Chronic knee arthropathy (Right); Chronic hip pain (Right); Myofascial pain syndrome, cervical (trapezius) (Left); Cervical facet syndrome (Bilateral) (L>R); Chronic low back pain (Bilateral) (L>R); Cervical sensory radiculopathy at C5 (Bilateral); Spondylosis without myelopathy or radiculopathy, cervical region; Cervicalgia (Bilateral) (L>R); and Chronic shoulder pain (Left) on their pertinent problem list.  PAIN:  Are you having pain? Yes: NPRS scale: < 5/10 usually Pain location: pain pathway as described above in subjective Pain description: burning, achy, stretched muscle sensation Aggravating factors: worse when she is sleeping, worse when she is on her side, bending side to side Relieving factors: Used to do ice, now taking ibuprofen  and pain medication, which doesn't help much  PRECAUTIONS: None  RED FLAGS: None   WEIGHT BEARING RESTRICTIONS: No  FALLS:  Has patient fallen in last 6 months? 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  NEXT MD VISIT: N/A, looking to get approved for an MRI appointment of the R  shoulder to see if further medical intervention is needed  OBJECTIVE:  Note: Objective measures were completed at Evaluation unless otherwise noted.  DIAGNOSTIC FINDINGS:  From Thoracic Spine MRI 08/27/23: MRI THORACIC SPINE FINDINGS   Alignment: No listhesis. In mild levocurvature of the upper thoracic spine with dextrocurvature of the lower thoracic spine and thoracolumbar junction. Straightening of the normal thoracic kyphosis.   Vertebrae: No acute fracture, evidence of discitis, or  suspicious osseous lesion.   Cord:  Normal signal and morphology.   Paraspinal and other soft tissues: Negative.   Disc levels:   T1-T2: Facet arthropathy. No spinal canal stenosis. Moderate to severe right and moderate left neural foraminal narrowing, unchanged.   T2-T3: Right eccentric disc bulge. Right greater than left facet arthropathy. No spinal canal stenosis. Severe right neural foraminal narrowing, unchanged.   T3-T4: Right eccentric disc bulge. Right greater than facet arthropathy. No spinal canal stenosis. Moderate right neural foraminal narrowing.   T4-T5: Facet arthropathy. No spinal canal stenosis or neural foraminal narrowing.   T5-T6: Left paracentral disc protrusion. No spinal canal stenosis or neural foraminal narrowing.   T6-T7: Small central disc protrusion. No spinal canal stenosis or neural foraminal narrowing.   T7-T8: Small right subarticular disc protrusion. No spinal canal stenosis or neural foraminal narrowing.   T8-T9: Facet arthropathy. No spinal canal stenosis or neural foraminal narrowing.   T9-T10: Moderate facet arthropathy. No spinal canal stenosis. Mild bilateral neural foraminal narrowing.   T10-T11: Moderate to severe facet arthropathy. No spinal canal stenosis. Mild bilateral neural foraminal narrowing.   T11-T12: Moderate facet arthropathy. No spinal canal stenosis. Mild left neural foraminal narrowing.    PATIENT SURVEYS:  At next  session*  COGNITION: Overall cognitive status: Within functional limits for tasks assessed     SENSATION: Light touch: Impaired  Gross R UE decreased compared to L, numbness reported in middle three fingers of R hand occasionally    POSTURE: rounded shoulders, forward head, increased lumbar lordosis, and decreased thoracic kyphosis  PALPATION: Multiple tight mm tissue bands and trigger points found upon palpation of pt posterior trunk: B UT tightness, R rhomboid trigger points/midline of scapula, gross paraspinal mm guarding and trigger points from cervical to lumbar region, R lower back tightness,painful to the touch  UE MMT:  R shoulder flexion: 5 L shoulder flexion: 5  R shoulder abduction: 4+, sharp pain up to shoulder from arm L shoulder abduction: 5  R shoulder IR: 4+ L shoulder IR: 4, sharp pain  R shoulder ER: 4+, sharp pain L shoulder ER: 5  UE ROM: All WFL, slight discomfort but no pain   LUMBAR ROM:   AROM eval  Flexion Fingertips to mid shin, sharp pain when max range reached in center of thoracolumbar junction and on R lower lumber  Extension WFL, no pain  Right lateral flexion Fingertips to top of knee cap, sharp pain on R side  Left lateral flexion Fingertips to mid knee cap, pain on R side  Right rotation 55*, no pain  Left rotation 62*, no pain   (Blank rows = not tested)  LOWER EXTREMITY ROM:     Active  Right eval Left eval  Hip flexion    Hip extension    Hip abduction    Hip adduction    Hip internal rotation    Hip external rotation    Knee flexion    Knee extension    Ankle dorsiflexion    Ankle plantarflexion    Ankle inversion    Ankle eversion     (Blank rows = not tested)  LOWER EXTREMITY MMT:    MMT Right eval Left eval  Hip flexion    Hip extension    Hip abduction    Hip adduction    Hip internal rotation    Hip external rotation    Knee flexion    Knee extension    Ankle dorsiflexion    Ankle plantarflexion  Ankle inversion    Ankle eversion     (Blank rows = not tested)  LUMBAR SPECIAL TESTS:  None at this session.  FUNCTIONAL TESTS:  Next session.  Quick Dash:  10/27/24  Q+B89:[id number] DASH      Please rate your ability do the following activities in the last week by selecting the number below the appropriate response.     Activities Rating  Open a tight or new jar.  3  Do heavy household chores (e.g., wash walls, floors). 2  Carry a shopping bag or briefcase 2  Wash your back. 1  Use a knife to cut food. 1  Recreational activities in which you take some force or impact through your arm, shoulder or hand (e.g., golf, hammering, tennis, etc.). 3  During the past week, to what extent has your arm, shoulder or hand problem interfered with your normal social activities with family, friends, neighbors or groups?  1  During the past week, were you limited in your work or other regular daily activities as a result of your arm, shoulder or hand problem? 1  Rate the severity of the following symptoms in the last week: Arm, Shoulder, or hand pain. 3  Rate the severity of the following symptoms in the last week: Tingling (pins and needles) in your arm, shoulder or hand. 2  During the past week, how much difficulty have you had sleeping because of the pain in your arm, shoulder or hand?  3   22  (A QuickDASH score may not be calculated if there is greater than 1 missing item.) 2   1  Quick Dash Disability/Symptom Score: [(sum of  (n) responses/ (n)] x 25 =  25     Minimally Clinically Important Difference (MCID): 15-20 points                                                                               TREATMENT DATE: 11/03/2024   Manual therapy:   PA thoracic mobs- grade 3-4 along T4-10 x several min Gentle PA ribs (Right side) around T4-T7 region- grade 2 oscillations x 30 x 3 SMT to R UT/ middle trap/lower trap and thoracic paraspinals (R side)   Therex:  -Sitting on stool with  elbows on edge of mat cat/cow x 15 reps (slow and controlled) - VC and visual cues to perform correctly -Quadruped Thoracic rotation- reach under x 15 reps each side -Supine alt Shoulder horz abd x 15 reps each Side -Standing Thoracic ext (arms outstretched on wall) - hold 10 sec x 4 -Standing ER- YTB 2 x 10  -Standing IR- YTB 2 x10 -Standing ABD- 2x 10        PATIENT EDUCATION:  Education details: Educated on causation of symptoms, past MRI imaging, HEP exercise, Person educated: Patient Education method: Demonstration Education comprehension: verbalized understanding  HOME EXERCISE PROGRAM: Access Code: [email or web address] URL: [email or web address] Date: 11/10/2024 Prepared by: Reyes London  Exercises - Prone Chest Stretch on Chair  - 1 x daily - 3 sets - 10 reps - Shoulder External Rotation and Scapular Retraction with Resistance  - 3 x weekly - 3 sets - 10 reps - Shoulder Internal Rotation with Resistance  -  3 x weekly - 3 sets - 10 reps - Standing Single Arm Shoulder Abduction with Resistance  - 3 x weekly - 3 sets - 10 reps - Shoulder Flexion Wall Slide with Towel  - 1 x daily - 3 sets - 10 reps      Access Code: [email or web address] URL: [email or web address] Date: 11/03/2024 Prepared by: Reyes London  Exercises - Seated Thoracic Self-Mobilization  - 1 x daily - 3 sets - 10 reps - Supine Thoracic Mobilization Towel Roll Vertical with Arm Stretch  - 1 x daily - 3 sets - 10 reps - Quadruped Thoracic Rotation - Reach Under  - 1 x daily - 3 sets - 10 reps - Sidelying Open Book Thoracic Lumbar Rotation and Extension  - 1 x daily - 3 sets - 10 reps      Access Code: [email or web address] URL: [email or web address] Date: 10/23/2024 Prepared by: Massie Dollar  Exercises - Standing with Forearms Thoracic Rotation  - 1 x daily - 7 x weekly - 3 sets - 10 reps - 2-3 second  hold  ASSESSMENT: CLINICAL IMPRESSION: Patient is a 64 y.o. female who was  seen today for physical therapy treatment for radicular pain, intervertebral disc degeneration, and spondylosis of the thoracic spine. To date- patient not responding with manual techniques or ROM activities- continued report of pain with no worsening during session yet no report of any relief. Will continue 1-2 more sessions and if no improvement will refer back to MD- focused at end on some rotator cuff strengthening without increased pain. Patient will continue to benefit from skilled therapy to address remaining deficits in order to improve overall QoL and return to PLOF.    OBJECTIVE IMPAIRMENTS: decreased activity tolerance, decreased mobility, decreased ROM, decreased strength, hypomobility, increased fascial restrictions, impaired perceived functional ability, impaired flexibility, impaired sensation, impaired UE functional use, improper body mechanics, postural dysfunction, and pain.   ACTIVITY LIMITATIONS: sleeping, stairs, and bed mobility  PARTICIPATION LIMITATIONS: medication management and occupation  PERSONAL FACTORS: Age, Past/current experiences, Time since onset of injury/illness/exacerbation, and 3+ comorbidities: chronic pain syndrome, fibromyalgia, depression, DJD are also affecting patient's functional outcome.   REHAB POTENTIAL: Fair Pt has complex medical history including chronic pain syndrome and fibromyalgia, and has not found relief for success with a few previous bouts of therapy, or medication management.  CLINICAL DECISION MAKING: Evolving/moderate complexity  EVALUATION COMPLEXITY: Moderate   GOALS: Goals reviewed with patient? No  SHORT TERM GOALS: Target date: 11/20/2024  Pt will be independent with HEP in order to decrease thoracolumbar pain in order to improve pain-free function at home.  Baseline: Goal status: INITIAL   LONG TERM GOALS: Target date: 01/15/2025   Pt will decrease worst pain as reported on NPRS by at least two points in order to  demonstrate clinically significant reduction in thoracolumbar pain. Baseline: 5-6/10 Goal status: INITIAL  2.  Pt will be able to increase active lumbar ROM with no tightness or sharp pains in order to be able to move freely and perform functional tasks for daily living. Baseline: See chart above. Goal status: INITIAL  Pt will decrease quick DASH score by at least 8% in order to demonstrate clinically significant reduction in disability. Baseline: 10/27/2024= 25  Goal status: New   PLAN:  PT FREQUENCY: 2x/week  PT DURATION: 12 weeks  PLANNED INTERVENTIONS: 97164- PT Re-evaluation, 97750- Physical Performance Testing, 97110-Therapeutic exercises, 97530- Therapeutic activity, [id number]- Neuromuscular re-education, 97535- Self Care, [id number]- Manual therapy, [id number]- Gait training, [id number]- Orthotic Initial, [phone]- Orthotic/Prosthetic  subsequent, [id number]- Aquatic Therapy, [phone] (1-2 muscles), [id number] (3+ muscles)- Dry Needling, Patient/Family education, Balance training, Stair training, Joint mobilization, Joint manipulation, Spinal manipulation, Spinal mobilization, Vestibular training, DME instructions, Wheelchair mobility training, Cryotherapy, and Moist heat.  PLAN FOR NEXT SESSION:  -Continue with progress postural activities as appropriate. -manual therapy of thoracic and R shoulder musculature -Review and expand HEP   Chyrl London, PT Physical Therapist  [place] at [facility] 11/11/2024, 9:00 AM    "

## 2024-11-11 ENCOUNTER — Ambulatory Visit: Admitting: Internal Medicine

## 2024-11-11 ENCOUNTER — Ambulatory Visit: Payer: Self-pay | Admitting: Internal Medicine

## 2024-11-11 DIAGNOSIS — G8929 Other chronic pain: Secondary | ICD-10-CM

## 2024-11-11 DIAGNOSIS — M5442 Lumbago with sciatica, left side: Secondary | ICD-10-CM | POA: Diagnosis not present

## 2024-11-11 DIAGNOSIS — Z0130 Encounter for examination of blood pressure without abnormal findings: Secondary | ICD-10-CM

## 2024-11-11 DIAGNOSIS — J302 Other seasonal allergic rhinitis: Secondary | ICD-10-CM | POA: Diagnosis not present

## 2024-11-11 DIAGNOSIS — M5441 Lumbago with sciatica, right side: Secondary | ICD-10-CM | POA: Diagnosis not present

## 2024-11-11 LAB — OXYCODONES,MS,WB/SP RFX
Oxycocone: 5.9 ng/mL
Oxycodones Confirmation: POSITIVE
Oxymorphone: NEGATIVE ng/mL

## 2024-11-11 LAB — DRUG SCREEN 13 W/CONF , SERUM
Amphetamines, IA: NEGATIVE ng/mL
Barbiturates, IA: NEGATIVE ug/mL
Benzodiazepines, IA: NEGATIVE ng/mL
Cocaine & Metabolite, IA: NEGATIVE ng/mL
FENTANYL, IA: NEGATIVE ng/mL
MEPERIDINE, IA: NEGATIVE ng/mL
Methadone, IA: NEGATIVE ng/mL
Opiates, IA: NEGATIVE ng/mL
Oxycodones, IA: POSITIVE ng/mL — AB
Phencyclidine, IA: NEGATIVE ng/mL
Propoxyphene, IA: NEGATIVE ng/mL
THC(Marijuana) Metabolite, IA: NEGATIVE ng/mL
TRAMADOL, IA: NEGATIVE ng/mL

## 2024-11-11 MED ORDER — OXYCODONE HCL 5 MG PO TABS: 5.0000 mg | ORAL_TABLET | Freq: Every day | ORAL | 0 refills | Status: DC | PRN

## 2024-11-11 MED ORDER — CETIRIZINE HCL 10 MG PO TABS: 10.0000 mg | ORAL_TABLET | Freq: Every morning | ORAL | 5 refills | Status: AC

## 2024-11-11 MED ORDER — IBUPROFEN 800 MG PO TABS: 800.0000 mg | ORAL_TABLET | Freq: Three times a day (TID) | ORAL | 0 refills | Status: AC | PRN

## 2024-11-11 NOTE — Progress Notes (Signed)
 "  Established Patient Office Visit  Subjective:  Patient ID: Nicole Walter, female    DOB: [birth year]  Age: 64 y.o. MRN: [id number]  Chief Complaint  Patient presents with   Pain Management    PM    Here for pain management follow up. Chronic pain well controlled on current analgesia. Last drug screen satisfactory and pill counts have also been satisfactory. She wants to be weaned off her oxy even further as she thinks it'Lequan Dobratz contributing to brain fog.    No other concerns at this time.   Past Medical History:  Diagnosis Date   Abnormal liver enzymes 02/08/2016   Allergy    Anxiety    Chest pain 07/05/2016   Chronic pain    Depression    DJD (degenerative joint disease)    lumbar   Fibromyalgia    High cholesterol    Rash of back 01/28/2016   Weight loss due to medication     Past Surgical History:  Procedure Laterality Date   ANKLE SURGERY     x 2   BREAST BIOPSY Left 1997   CHOLECYSTECTOMY     COLONOSCOPY     COLONOSCOPY WITH PROPOFOL  N/A 02/22/2016   Procedure: COLONOSCOPY WITH PROPOFOL ;  Surgeon: Gladis RAYMOND Mariner, MD;  Location: [facility] ENDOSCOPY;  Service: Endoscopy;  Laterality: N/A;   ESOPHAGOGASTRODUODENOSCOPY (EGD) WITH PROPOFOL  N/A 02/22/2016   Procedure: ESOPHAGOGASTRODUODENOSCOPY (EGD) WITH PROPOFOL ;  Surgeon: Gladis RAYMOND Mariner, MD;  Location: [facility] ENDOSCOPY;  Service: Endoscopy;  Laterality: N/A;   NASAL SINUS SURGERY     SHOULDER ARTHROSCOPY WITH ROTATOR CUFF REPAIR AND SUBACROMIAL DECOMPRESSION Left 09/23/2019   Procedure: SHOULDER ARTHROSCOPY WITH MINI ROTATOR CUFF REPAIR AND SUBACROMIAL DECOMPRESSION, BICEPS TENODESIS;  Surgeon: Tobie Priest, MD;  Location: [facility];  Service: Orthopedics;  Laterality: Left;   TONSILLECTOMY     TUBAL LIGATION      Social History   Socioeconomic History   Marital status: Legally Separated    Spouse name: Not on file   Number of children: Not on file   Years of education: Not on file   Highest education  level: Not on file  Occupational History   Not on file  Tobacco Use   Smoking status: Former    Current packs/day: 0.00    Average packs/day: 1 pack/day for 41.3 years (41.3 ttl pk-yrs)    Types: E-cigarettes, Cigarettes    Start date: 03/06/1974    Quit date: 07/08/2015    Years since quitting: 9.3   Smokeless tobacco: Never   Tobacco comments:    vapor cigarettes, no nicotene  Vaping Use   Vaping status: Every Day   Start date: 07/08/2015   Substances: Nicotine , Flavoring   Devices: Nort, Geek  Substance and Sexual Activity   Alcohol use: No    Alcohol/week: 0.0 standard drinks of alcohol   Drug use: No   Sexual activity: Not Currently    Birth control/protection: Post-menopausal  Other Topics Concern   Not on file  Social History Narrative   Not on file   Social Drivers of Health   Tobacco Use: Medium Risk (11/03/2024)   Patient History    Smoking Tobacco Use: Former    Smokeless Tobacco Use: Never    Passive Exposure: Not on file  Financial Resource Strain: Medium Risk (06/03/2024)   Received from [facility]   Overall Financial Resource Strain (CARDIA)    Difficulty of Paying Living Expenses: Somewhat hard  Food Insecurity: Food Insecurity Present (  06/03/2024)   Received from [facility]   Epic    Within the past 12 months, you worried that your food would run out before you got the money to buy more.: Often true    Within the past 12 months, the food you bought just didn't last and you didn't have money to get more.: Often true  Transportation Needs: No Transportation Needs (06/03/2024)   Received from [facility] - Transportation    In the past 12 months, has lack of transportation kept you from medical appointments or from getting medications?: No    Lack of Transportation (Non-Medical): No  Physical Activity: Not on file  Stress: Not on file  Social Connections: Not on file  Intimate Partner  Violence: Not on file  Depression (PHQ2-9): Low Risk (09/30/2024)   Depression (PHQ2-9)    PHQ-2 Score: 0  Alcohol Screen: Not on file  Housing: Low Risk  (06/03/2024)   Received from [facility]   Epic    In the last 12 months, was there a time when you were not able to pay the mortgage or rent on time?: No    In the past 12 months, how many times have you moved where you were living?: 1    At any time in the past 12 months, were you homeless or living in a shelter (including now)?: No  Utilities: Not At Risk (06/03/2024)   Received from [facility]   Epic    In the past 12 months has the electric, gas, oil, or water company threatened to shut off services in your home?: No  Health Literacy: Low Risk (06/12/2023)   Received from [facility]   Health Literacy    : Never    Family History  Problem Relation Age of Onset   Heart disease Mother    Stroke Mother    Cancer Father        lung   Arthritis Father    Arthritis Paternal Grandmother    Arthritis Paternal Grandfather    Drug abuse Sister    Drug abuse Brother    Post-traumatic stress disorder Brother    Diabetes Sister    Dementia Sister    Drug abuse Brother    Cancer Brother     Allergies[1]  Show/hide medication list[2]  Review of Systems  Constitutional:  Negative for weight loss.  HENT: Negative.    Eyes: Negative.   Cardiovascular: Negative.   Gastrointestinal: Negative.   Genitourinary: Negative.        Urge incontinence  Musculoskeletal:  Positive for back pain, joint pain and neck pain.       As in hpi  Skin: Negative.   Neurological: Negative.   Endo/Heme/Allergies: Negative.   Psychiatric/Behavioral:  The patient is nervous/anxious.        Objective:   BP 126/66   Pulse 70   Temp 98.2 F (36.8 C)   Ht 5' 4 (1.626 m)   Wt 154 lb 9.6 oz (70.1 kg)   SpO2 98%   BMI 26.54 kg/m   Vitals:   11/11/24 1317  BP: 126/66  Pulse: 70  Temp: 98.2 F  (36.8 C)  Height: 5' 4 (1.626 m)  Weight: 154 lb 9.6 oz (70.1 kg)  SpO2: 98%  BMI (Calculated): 26.52    Physical Exam Vitals reviewed.  Constitutional:      General: She is not in acute distress. HENT:  Head: Normocephalic.     Nose: Nose normal.     Mouth/Throat:     Mouth: Mucous membranes are moist.  Eyes:     Extraocular Movements: Extraocular movements intact.     Pupils: Pupils are equal, round, and reactive to light.  Cardiovascular:     Rate and Rhythm: Normal rate and regular rhythm.     Heart sounds: No murmur heard. Pulmonary:     Effort: Pulmonary effort is normal.     Breath sounds: No rhonchi or rales.  Abdominal:     General: Abdomen is flat.     Palpations: There is no hepatomegaly, splenomegaly or mass.     Tenderness: There is no guarding or rebound.  Musculoskeletal:        General: Normal range of motion.     Cervical back: Normal range of motion. No tenderness.  Skin:    General: Skin is warm and dry.  Neurological:     General: No focal deficit present.     Mental Status: She is alert and oriented to person, place, and time.     Cranial Nerves: No cranial nerve deficit.     Motor: No weakness.  Psychiatric:        Mood and Affect: Mood normal.        Behavior: Behavior normal.      No results found for any visits on 11/11/24.      Assessment & Plan:  Maika was seen today for pain management.  Chronic low back pain (Bilateral) (L>R) -     oxyCODONE  HCl; Take 1 tablet (5 mg total) by mouth daily as needed for severe pain (pain score 7-10). Prescribed by Albina GORMAN Dine, MD  Dispense: 30 tablet; Refill: 0 -     Ibuprofen ; Take 1 tablet (800 mg total) by mouth every 8 (eight) hours as needed.  Dispense: 90 tablet; Refill: 0  Seasonal allergic rhinitis, unspecified trigger -     Cetirizine  HCl; Take 1 tablet (10 mg total) by mouth every morning.  Dispense: 30 tablet; Refill: 5   Taper oxycodone  by taking every other day x1 wk  then 1 twice a week for week then one weekly then stop. Problem List Items Addressed This Visit       Nervous and Auditory   Chronic low back pain (Bilateral) (L>R) (Chronic)   Relevant Medications   oxyCODONE  (OXY IR/ROXICODONE ) 5 MG immediate release tablet   ibuprofen  (ADVIL ) 800 MG tablet   Other Visit Diagnoses       Seasonal allergic rhinitis, unspecified trigger       Relevant Medications   cetirizine  (ZYRTEC ) 10 MG tablet       Return in about 1 month (around 12/12/2024).   Total time spent: 20 minutes. This time includes review of previous notes and results and patient face to face interaction during today'Germaine Shenker visit.    Sherrill Dine Albina, MD  11/11/2024   This document may have been prepared by [facility] Voice Recognition software and as such may include unintentional dictation errors.      [1]  Allergies Allergen Reactions   Other Hives and Other (See Comments)    Pt states she was tested for allergies and peas was one that she is allergic to but has never ate enough of them to see a reaction. Pt states when she is around bird feathers she gets the hives FEATHERS   Adhesive [Tape] Hives   Food     PEAS/ patient does not  know what type reaction she has/ from allergy test during childhood   Statins Other (See Comments)    Severe myalgia   Pea Other (See Comments)    Other reaction(Vernica Wachtel): Unknown.  Pt states she was tested for allergies and peas was one that she is allergic to but has never ate enough of them to see a reaction.  [2]  Outpatient Medications Prior to Visit  Medication Sig   Azelastine  HCl 137 MCG/SPRAY SOLN Place 1 Inhalation into the nose daily.   Calcium Carb-Cholecalciferol (CALCIUM 600 + D) 600-5 MG-MCG TABS Take by mouth.   ezetimibe  (ZETIA ) 10 MG tablet Take 1 tablet (10 mg total) by mouth daily.   fenofibrate  160 MG tablet Take 1 tablet (160 mg total) by mouth daily.   LINZESS  72 MCG capsule TK 1 C PO QD   LORazepam  (ATIVAN ) 1 MG tablet  Take 1 tablet (1 mg total) by mouth every 8 (eight) hours as needed. for anxiety   Multiple Vitamin (MULTI-VITAMINS) TABS Take 1 tablet by mouth daily.   mupirocin ointment (BACTROBAN) 2 % Apply to affected area of the skin up to three times per day as needed.   naloxone  (NARCAN ) nasal spray 4 mg/0.1 mL 1 spray in nostril if very drowsy after taking pain medicine   omeprazole (PRILOSEC) 40 MG capsule Take 40 mg by mouth daily.   ondansetron  (ZOFRAN -ODT) 4 MG disintegrating tablet Take 1 tablet (4 mg total) by mouth every 8 (eight) hours as needed for nausea or vomiting.   Vilazodone HCl (VIIBRYD) 10 MG TABS Take 10 mg by mouth daily.   [DISCONTINUED] cetirizine  (ZYRTEC ) 10 MG tablet Take 1 tablet (10 mg total) by mouth every morning.   [DISCONTINUED] ibuprofen  (ADVIL ) 800 MG tablet Take 1 tablet (800 mg total) by mouth every 8 (eight) hours as needed.   [DISCONTINUED] oxyCODONE  (OXY IR/ROXICODONE ) 5 MG immediate release tablet Take 1 tablet (5 mg total) by mouth daily as needed for severe pain (pain score 7-10). Prescribed by Albina GORMAN Dine, MD   nicotine  (NICODERM CQ  - DOSED IN MG/24 HOURS) 14 mg/24hr patch Place 1 patch (14 mg total) onto the skin daily. (Patient not taking: Reported on 11/11/2024)   No facility-administered medications prior to visit.   "

## 2024-11-12 ENCOUNTER — Ambulatory Visit

## 2024-11-14 ENCOUNTER — Ambulatory Visit: Admitting: Internal Medicine

## 2024-11-17 ENCOUNTER — Ambulatory Visit: Admitting: Internal Medicine

## 2024-11-17 ENCOUNTER — Ambulatory Visit

## 2024-11-17 ENCOUNTER — Ambulatory Visit: Payer: Self-pay | Admitting: Internal Medicine

## 2024-11-17 VITALS — BP 128/68 | HR 58 | Ht 64.0 in | Wt 155.0 lb

## 2024-11-17 DIAGNOSIS — N939 Abnormal uterine and vaginal bleeding, unspecified: Secondary | ICD-10-CM | POA: Diagnosis not present

## 2024-11-17 DIAGNOSIS — R319 Hematuria, unspecified: Secondary | ICD-10-CM

## 2024-11-17 DIAGNOSIS — Z0130 Encounter for examination of blood pressure without abnormal findings: Secondary | ICD-10-CM

## 2024-11-17 LAB — POCT URINALYSIS DIPSTICK
Bilirubin, UA: NEGATIVE
Blood, UA: NEGATIVE
Glucose, UA: NEGATIVE
Ketones, UA: NEGATIVE
Nitrite, UA: NEGATIVE
Protein, UA: NEGATIVE
Spec Grav, UA: 1.015
Urobilinogen, UA: 0.2 U/dL
pH, UA: 6.5

## 2024-11-17 NOTE — Progress Notes (Signed)
 "  Established Patient Office Visit  Subjective:  Patient ID: Nicole Walter, female    DOB: 10/06/1961  Age: 64 y.o. MRN: [id number]  Chief Complaint  Patient presents with   Follow-up    Blood in urine, side pain    C/o recurrent vaginal bleeding, had workup for this last year. UA negative for hematuria.    No other concerns at this time.   Past Medical History:  Diagnosis Date   Abnormal liver enzymes 02/08/2016   Allergy    Anxiety    Chest pain 07/05/2016   Chronic pain    Depression    DJD (degenerative joint disease)    lumbar   Fibromyalgia    High cholesterol    Rash of back 01/28/2016   Weight loss due to medication     Past Surgical History:  Procedure Laterality Date   ANKLE SURGERY     x 2   BREAST BIOPSY Left 1997   CHOLECYSTECTOMY     COLONOSCOPY     COLONOSCOPY WITH PROPOFOL  N/A 02/22/2016   Procedure: COLONOSCOPY WITH PROPOFOL ;  Surgeon: Gladis RAYMOND Mariner, MD;  Location: [facility] ENDOSCOPY;  Service: Endoscopy;  Laterality: N/A;   ESOPHAGOGASTRODUODENOSCOPY (EGD) WITH PROPOFOL  N/A 02/22/2016   Procedure: ESOPHAGOGASTRODUODENOSCOPY (EGD) WITH PROPOFOL ;  Surgeon: Gladis RAYMOND Mariner, MD;  Location: [facility] ENDOSCOPY;  Service: Endoscopy;  Laterality: N/A;   NASAL SINUS SURGERY     SHOULDER ARTHROSCOPY WITH ROTATOR CUFF REPAIR AND SUBACROMIAL DECOMPRESSION Left 09/23/2019   Procedure: SHOULDER ARTHROSCOPY WITH MINI ROTATOR CUFF REPAIR AND SUBACROMIAL DECOMPRESSION, BICEPS TENODESIS;  Surgeon: Tobie Priest, MD;  Location: [facility];  Service: Orthopedics;  Laterality: Left;   TONSILLECTOMY     TUBAL LIGATION      Social History   Socioeconomic History   Marital status: Legally Separated    Spouse name: Not on file   Number of children: Not on file   Years of education: Not on file   Highest education level: Not on file  Occupational History   Not on file  Tobacco Use   Smoking status: Former    Current packs/day: 0.00    Average packs/day: 1  pack/day for 41.3 years (41.3 ttl pk-yrs)    Types: E-cigarettes, Cigarettes    Start date: 03/06/1974    Quit date: 07/08/2015    Years since quitting: 9.3   Smokeless tobacco: Never   Tobacco comments:    vapor cigarettes, no nicotene  Vaping Use   Vaping status: Every Day   Start date: 07/08/2015   Substances: Nicotine , Flavoring   Devices: Nort, Geek  Substance and Sexual Activity   Alcohol use: No    Alcohol/week: 0.0 standard drinks of alcohol   Drug use: No   Sexual activity: Not Currently    Birth control/protection: Post-menopausal  Other Topics Concern   Not on file  Social History Narrative   Not on file   Social Drivers of Health   Tobacco Use: Medium Risk (11/03/2024)   Patient History    Smoking Tobacco Use: Former    Smokeless Tobacco Use: Never    Passive Exposure: Not on file  Financial Resource Strain: Medium Risk (06/03/2024)   Received from [facility]   Overall Financial Resource Strain (CARDIA)    Difficulty of Paying Living Expenses: Somewhat hard  Food Insecurity: Food Insecurity Present (06/03/2024)   Received from [facility]   Epic    Within the past 12 months, you worried that your food  would run out before you got the money to buy more.: Often true    Within the past 12 months, the food you bought just didn't last and you didn't have money to get more.: Often true  Transportation Needs: No Transportation Needs (06/03/2024)   Received from [facility]    In the past 12 months, has lack of transportation kept you from medical appointments or from getting medications?: No    Lack of Transportation (Non-Medical): No  Physical Activity: Not on file  Stress: Not on file  Social Connections: Not on file  Intimate Partner Violence: Not on file  Depression (PHQ2-9): Low Risk (09/30/2024)   Depression (PHQ2-9)    PHQ-2 Score: 0  Alcohol Screen: Not on file  Housing: Low Risk   (06/03/2024)   Received from [facility]   Epic    In the last 12 months, was there a time when you were not able to pay the mortgage or rent on time?: No    In the past 12 months, how many times have you moved where you were living?: 1    At any time in the past 12 months, were you homeless or living in a shelter (including now)?: No  Utilities: Not At Risk (06/03/2024)   Received from [facility]   Epic    In the past 12 months has the electric, gas, oil, or water company threatened to shut off services in your home?: No  Health Literacy: Low Risk (06/12/2023)   Received from [facility]   Health Literacy    : Never    Family History  Problem Relation Age of Onset   Heart disease Mother    Stroke Mother    Cancer Father        lung   Arthritis Father    Arthritis Paternal Grandmother    Arthritis Paternal Grandfather    Drug abuse Sister    Drug abuse Brother    Post-traumatic stress disorder Brother    Diabetes Sister    Dementia Sister    Drug abuse Brother    Cancer Brother     Allergies[1]  Show/hide medication list[2]  Review of Systems  Constitutional:  Negative for weight loss.  HENT: Negative.    Eyes: Negative.   Cardiovascular: Negative.   Gastrointestinal: Negative.   Genitourinary: Negative.        As in hpi  Musculoskeletal:  Positive for back pain, joint pain and neck pain.       As in hpi  Skin: Negative.   Neurological: Negative.   Endo/Heme/Allergies: Negative.   Psychiatric/Behavioral:  The patient is nervous/anxious.        Objective:   BP 128/68   Pulse (!) 58   Ht 5' 4 (1.626 m)   Wt 155 lb (70.3 kg)   SpO2 98%   BMI 26.61 kg/m   Vitals:   11/17/24 1142  BP: 128/68  Pulse: (!) 58  Height: 5' 4 (1.626 m)  Weight: 155 lb (70.3 kg)  SpO2: 98%  BMI (Calculated): 26.59    Physical Exam Vitals reviewed.  Constitutional:      General: She is not in acute distress. HENT:     Head:  Normocephalic.     Nose: Nose normal.     Mouth/Throat:     Mouth: Mucous membranes are moist.  Eyes:     Extraocular Movements: Extraocular movements intact.  Pupils: Pupils are equal, round, and reactive to light.  Cardiovascular:     Rate and Rhythm: Normal rate and regular rhythm.     Heart sounds: No murmur heard. Pulmonary:     Effort: Pulmonary effort is normal.     Breath sounds: No rhonchi or rales.  Abdominal:     General: Abdomen is flat.     Palpations: There is no hepatomegaly, splenomegaly or mass.     Tenderness: There is abdominal tenderness in the right lower quadrant and left lower quadrant. There is no guarding or rebound.  Musculoskeletal:        General: Normal range of motion.     Cervical back: Normal range of motion. No tenderness.  Skin:    General: Skin is warm and dry.  Neurological:     General: No focal deficit present.     Mental Status: She is alert and oriented to person, place, and time.     Cranial Nerves: No cranial nerve deficit.     Motor: No weakness.  Psychiatric:        Mood and Affect: Mood normal.        Behavior: Behavior normal.      Results for orders placed or performed in visit on 11/17/24  POCT Urinalysis Dipstick  Result Value Ref Range   Color, UA     Clarity, UA     Glucose, UA Negative Negative   Bilirubin, UA Negative    Ketones, UA Negative    Spec Grav, UA 1.015 1.010 - 1.025   Blood, UA Negative    pH, UA 6.5 5.0 - 8.0   Protein, UA Negative Negative   Urobilinogen, UA 0.2 0.2 or 1.0 E.U./dL   Nitrite, UA Negative    Leukocytes, UA Large (3+) (A) Negative   Appearance     Odor          Assessment & Plan:  Diara was seen today for follow-up.  Hematuria, unspecified type -     POCT urinalysis dipstick  Vaginal bleeding   Follow up with OBGYN Problem List Items Addressed This Visit   None Visit Diagnoses       Hematuria, unspecified type    -  Primary   Relevant Orders   POCT Urinalysis  Dipstick (Completed)     Vaginal bleeding           Return if symptoms worsen or fail to improve.   Total time spent: 20 minutes. This time includes review of previous notes and results and patient face to face interaction during today'Khylen Riolo visit.    Sherrill Cinderella Perry, MD  11/17/2024   This document may have been prepared by [facility] Voice Recognition software and as such may include unintentional dictation errors.     [1]  Allergies Allergen Reactions   Other Hives and Other (See Comments)    Pt states she was tested for allergies and peas was one that she is allergic to but has never ate enough of them to see a reaction. Pt states when she is around bird feathers she gets the hives FEATHERS   Adhesive [Tape] Hives   Food     PEAS/ patient does not know what type reaction she has/ from allergy test during childhood   Statins Other (See Comments)    Severe myalgia   Pea Other (See Comments)    Other reaction(Breyer Tejera): Unknown.  Pt states she was tested for allergies and peas was one that she is allergic to  but has never ate enough of them to see a reaction.  [2]  Outpatient Medications Prior to Visit  Medication Sig   Azelastine  HCl 137 MCG/SPRAY SOLN Place 1 Inhalation into the nose daily.   Calcium Carb-Cholecalciferol (CALCIUM 600 + D) 600-5 MG-MCG TABS Take by mouth.   cetirizine  (ZYRTEC ) 10 MG tablet Take 1 tablet (10 mg total) by mouth every morning.   ezetimibe  (ZETIA ) 10 MG tablet Take 1 tablet (10 mg total) by mouth daily.   fenofibrate  160 MG tablet Take 1 tablet (160 mg total) by mouth daily.   ibuprofen  (ADVIL ) 800 MG tablet Take 1 tablet (800 mg total) by mouth every 8 (eight) hours as needed.   LINZESS  72 MCG capsule TK 1 C PO QD   LORazepam  (ATIVAN ) 1 MG tablet Take 1 tablet (1 mg total) by mouth every 8 (eight) hours as needed. for anxiety   Multiple Vitamin (MULTI-VITAMINS) TABS Take 1 tablet by mouth daily.   mupirocin ointment (BACTROBAN) 2 % Apply to  affected area of the skin up to three times per day as needed.   naloxone  (NARCAN ) nasal spray 4 mg/0.1 mL 1 spray in nostril if very drowsy after taking pain medicine   nicotine  (NICODERM CQ  - DOSED IN MG/24 HOURS) 14 mg/24hr patch Place 1 patch (14 mg total) onto the skin daily. (Patient not taking: Reported on 11/11/2024)   omeprazole (PRILOSEC) 40 MG capsule Take 40 mg by mouth daily.   ondansetron  (ZOFRAN -ODT) 4 MG disintegrating tablet Take 1 tablet (4 mg total) by mouth every 8 (eight) hours as needed for nausea or vomiting.   oxyCODONE  (OXY IR/ROXICODONE ) 5 MG immediate release tablet Take 1 tablet (5 mg total) by mouth daily as needed for severe pain (pain score 7-10). Prescribed by Albina GORMAN Dine, MD   Vilazodone HCl (VIIBRYD) 10 MG TABS Take 10 mg by mouth daily.   No facility-administered medications prior to visit.   "

## 2024-11-19 ENCOUNTER — Ambulatory Visit: Admitting: Physical Therapy

## 2024-11-21 ENCOUNTER — Ambulatory Visit: Admitting: Physical Therapy

## 2024-11-24 ENCOUNTER — Ambulatory Visit

## 2024-11-24 DIAGNOSIS — M542 Cervicalgia: Secondary | ICD-10-CM

## 2024-11-24 DIAGNOSIS — G8929 Other chronic pain: Secondary | ICD-10-CM

## 2024-11-24 DIAGNOSIS — M546 Pain in thoracic spine: Secondary | ICD-10-CM

## 2024-11-24 DIAGNOSIS — M5414 Radiculopathy, thoracic region: Secondary | ICD-10-CM

## 2024-11-24 DIAGNOSIS — G894 Chronic pain syndrome: Secondary | ICD-10-CM | POA: Diagnosis not present

## 2024-11-24 DIAGNOSIS — R200 Anesthesia of skin: Secondary | ICD-10-CM

## 2024-11-24 NOTE — Progress Notes (Unsigned)
" ° ° °  GYNECOLOGY PROGRESS NOTE  Subjective:  PCP: Albina GORMAN Dine, MD  Patient ID: Nicole Walter, female    DOB: [birth year], 64 y.o.   MRN: [id number]  HPI  Patient is a 64 y.o. G62P2002 female who presents for continued postmenopasual bleeding.  She has been menopausal for 10+ years.   Pt had a TVUS 06/23/24 with an EMT = 2mm.  EMB 07/29/24 was negative for atypia.  She reports continued creamy colored discharge daily.  Then noticed some burgundy colored discharge on her liner. She also reports a slight scratching sensation in her vagina. She does not take blood thinner medication. Her pap is UTD, 01/2023 NILM, HPV-. Denies sexual activity recently.   The following portions of the patient's history were reviewed and updated as appropriate: allergies, current medications, past family history, past medical history, past social history, past surgical history, and problem list.  Review of Systems Pertinent items are noted in HPI.   Objective:   Blood pressure (!) 144/72, pulse (!) 57, height 5' 4 (1.626 m), weight 157 lb 1.6 oz (71.3 kg). Body mass index is 26.97 kg/m.  General appearance: alert and cooperative Abdomen: soft, non-tender; bowel sounds normal; no masses,  no organomegaly Pelvic: cervix normal in appearance, external genitalia normal, no adnexal masses or tenderness, no cervical motion tenderness, rectovaginal septum normal, uterus normal size, shape, and consistency, and vaginal mucosa moderately atrophic, moderate amount of greenish discharge present.  Extremities: extremities normal, atraumatic, no cyanosis or edema Neurologic: Grossly normal  Pathology 07/29/24 FINAL MICROSCOPIC DIAGNOSIS:   A. ENDOMETRIUM, BIOPSY:  - Scant strips of atrophic endometrial glandular epithelium.  - Negative for atypia/EIN and malignancy in this limited specimen.  - Deeper sections were examined. FINAL MICROSCOPIC DIAGNOSIS:   Ultrasound 06/23/24 FINDINGS: Uterus   Measurements: 5.7 x  3.0 x 3.6 cm = volume: 32 mL. No fibroids or other mass visualized.   Endometrium   Thickness: 2 mm. Trace fluid within the endometrial cavity, nonspecific. Similar pattern of subcentimeter echogenic shadowing foci along the posterior endometrium/myometrial interface compared to 2018 favored to be scattered benign calcifications as before.   Right ovary   Measurements: 1.1 x 0.6 x 1.1 cm = volume: 0.4 mL. Normal appearance/no adnexal mass.   Left ovary   Not visualized   Other findings   No abnormal free fluid.   IMPRESSION: 1. Trace fluid within the endometrial cavity, nonspecific. 2. Normal endometrial thickness. 3. Nonvisualization of the left ovary. 4. No other acute finding by pelvic ultrasound. Assessment/Plan:   1. Postmenopausal bleeding   2. Vaginal discharge     64 y.o. [id number], post-menopausal for 10+ yrs, with negative workup for PMB last fall, EMT 2mm on US  and EMB negative in Sep. '25, now with a new episode of spotting and vaginal discharge.  -Recommend hysteroscopy, pt declined at this time; reassurance that this is likely benign with her prior workup, possibly due to atrophy, hormonal changes, potentially a missed polyp. Counseled to notify if the bleeding continues or changes in nature  -Swab for greenish discharge -- will call with results.    Estil Mangle, DO [facility] "

## 2024-11-24 NOTE — Therapy (Signed)
 " OUTPATIENT PHYSICAL THERAPY THORACOLUMBAR TREATMENT   Patient Name: Nicole Walter MRN: [id number] DOB:1960-12-10, 64 y.o., female Today's Date: 11/24/2024  END OF SESSION:  PT End of Session - 11/24/24 0937     Visit Number 5    Number of Visits 24    Progress Note Due on Visit 10    PT Start Time 0935    PT Stop Time 1014    PT Time Calculation (min) 39 min    Activity Tolerance Patient tolerated treatment well    Behavior During Therapy [facility] for tasks assessed/performed            Past Medical History:  Diagnosis Date   Abnormal liver enzymes 02/08/2016   Allergy    Anxiety    Chest pain 07/05/2016   Chronic pain    Depression    DJD (degenerative joint disease)    lumbar   Fibromyalgia    High cholesterol    Rash of back 01/28/2016   Weight loss due to medication    Past Surgical History:  Procedure Laterality Date   ANKLE SURGERY     x 2   BREAST BIOPSY Left 1997   CHOLECYSTECTOMY     COLONOSCOPY     COLONOSCOPY WITH PROPOFOL  N/A 02/22/2016   Procedure: COLONOSCOPY WITH PROPOFOL ;  Surgeon: Gladis RAYMOND Mariner, MD;  Location: [facility] ENDOSCOPY;  Service: Endoscopy;  Laterality: N/A;   ESOPHAGOGASTRODUODENOSCOPY (EGD) WITH PROPOFOL  N/A 02/22/2016   Procedure: ESOPHAGOGASTRODUODENOSCOPY (EGD) WITH PROPOFOL ;  Surgeon: Gladis RAYMOND Mariner, MD;  Location: [facility] ENDOSCOPY;  Service: Endoscopy;  Laterality: N/A;   NASAL SINUS SURGERY     SHOULDER ARTHROSCOPY WITH ROTATOR CUFF REPAIR AND SUBACROMIAL DECOMPRESSION Left 09/23/2019   Procedure: SHOULDER ARTHROSCOPY WITH MINI ROTATOR CUFF REPAIR AND SUBACROMIAL DECOMPRESSION, BICEPS TENODESIS;  Surgeon: Tobie Priest, MD;  Location: [facility];  Service: Orthopedics;  Laterality: Left;   TONSILLECTOMY     TUBAL LIGATION     Patient Active Problem List   Diagnosis Date Noted   Other intervertebral disc degeneration, thoracic region 09/30/2024   Primary osteoarthritis of right hip 09/30/2024   Postmenopausal bleeding  07/29/2024   Dyspepsia 10/15/2023   Hot flash, menopausal 09/03/2023   Chronic bronchitis (HCC) 09/03/2023   Piriformis syndrome of right side 08/20/2023   Oral thrush 05/15/2023   Acute maxillary sinusitis 05/15/2023   Chronic radicular lumbar pain 03/20/2023   Exposure to sexually transmitted disease (STD) 01/25/2023   Acute vaginitis 01/25/2023   Abnormal cervical Papanicolaou smear 01/25/2023   Abnormal MRI, cervical spine 07/22/2019   Neurogenic pain 07/22/2019   DDD (degenerative disc disease), cervical 06/09/2019   Chronic musculoskeletal pain 05/14/2019   Needle phobia 09/26/2018   Spondylosis without myelopathy or radiculopathy, lumbar region 08/29/2018   Lumbar facet hypertrophy 08/29/2018   Lumbar facet syndrome (Right) 08/29/2018   DDD (degenerative disc disease), thoracic 08/29/2018   Thoracic spondylosis with radiculopathy (Right) 08/29/2018   Spinal enthesopathy of lumbar region 08/29/2018   Chronic midline low back pain without sciatica 08/29/2018   Generalized anxiety disorder 07/23/2018   Cervical facet hypertrophy (Bilateral) 07/18/2018   Cervical central spinal stenosis 07/18/2018   Chronic shoulder pain (Left) 07/09/2018   Cervicalgia (Bilateral) (L>R) 03/28/2018   Spondylosis without myelopathy or radiculopathy, cervical region 02/11/2018   Chronic pelvic pain in female 08/24/2017   Fatty liver 08/24/2017   Chronic low back pain (Bilateral) (L>R) 08/13/2017   Foraminal stenosis of cervical region 08/13/2017   Chronic low back pain 08/13/2017  Cervical sensory radiculopathy at C5 (Bilateral) 08/13/2017   Cervical radicular pain 08/13/2017   Constipation 07/16/2017   Myofascial pain syndrome, cervical (trapezius) (Left) 05/28/2017   Cervical facet syndrome (Bilateral) (L>R) 05/28/2017   Muscle spasm 05/15/2017   Chronic hip pain (Right) 03/19/2017   Chronic knee arthropathy (Right) 03/14/2017   Osteoarthritis of knee (Right) 02/21/2017   Osteoarthritis  of shoulder (Right) 01/31/2017   Shortness of breath 01/15/2017   Thoracic spondylosis 01/01/2017   Radicular pain of thoracic region 12/21/2016   Osteoarthritis of hip (Bilateral) 11/20/2016   Greater trochanteric bursitis (Right) 09/18/2016   Subacromial bursitis of shoulder joint (Right) 09/18/2016   Overweight (BMI 25.0-29.9) 09/14/2016   ESR raised 08/31/2016   Elevated C-reactive protein (CRP) 08/31/2016   Long term (current) use of opiate analgesic 07/20/2016   Long term prescription opiate use 07/20/2016   Opiate use 07/20/2016   Chronic upper back pain (Secondary Area of Pain) (Bilateral) (L>R) 07/20/2016   Chronic shoulder pain (Tertiary Area of Pain) (Bilateral) (L>R) 07/20/2016   Chronic knee pain (Right) 07/20/2016   Chronic upper extremity pain (Left) 07/20/2016   Chronic lower extremity pain (Bilateral) (L>R) 07/20/2016   Chronic abdominal pain 07/20/2016   Chronic hip pain (Bilateral) (R>L) 05/31/2016   Chronic Greater trochanteric bursitis (Bilateral) (L>R) 05/31/2016   Bursitis of shoulder (Right) 05/17/2016   Skin lesions 04/27/2016   Hyperlipidemia 02/18/2016   Cervical spondylosis (Bilateral) 02/08/2016   Fibromyalgia 02/08/2016   Osteopenia 02/08/2016   Chronic neck pain (Primary Area of Pain) (Bilateral) (L>R) 01/28/2016   Concentration deficit 01/28/2016   Routine history and physical examination of adult 01/14/2016   Lumbar spondylosis 01/06/2016   Anxiety and depression 07/11/2015   Chronic pain syndrome 07/11/2015   Combined fat and carbohydrate induced hyperlipemia 07/11/2015    PCP: Albina GORMAN Dine, MD   REFERRING PROVIDER: Tobie Emmy POUR, NP   REFERRING DIAG:  M54.14 (ICD-10-CM) - Radicular pain of thoracic region M19.011 (ICD-10-CM) - Primary osteoarthritis of right shoulder G89.4 (ICD-10-CM) - Chronic pain syndrome M47.894 (ICD-10-CM) - Other spondylosis, thoracic region M51.34 (ICD-10-CM) - Other intervertebral disc degeneration,  thoracic region   Rationale for Evaluation and Treatment: Rehabilitation  THERAPY DIAG:  Chronic pain syndrome  Pain in thoracic spine  Numbness in both hands  Radiculopathy, thoracic region  Chronic pain of both shoulders  Cervicalgia  ONSET DATE: since around 2015 it's gotten worse  SUBJECTIVE:  SUBJECTIVE STATEMENT: From Today: Patient reports no changes in thoracic pain - rates at 4/10- radiates around right side to chest/sternum.   From Eval: Pt says her pain is in the R side her body, traveling from her cervical spine, down midline towards the thoracic region, and down into her quadratus lumborum/ hip region. Pt reports pain even being caused by pressure from her waistband against the muscle tissue. She reports the worst pain she experiences is in her R hip, like someone is trying to pry it apart. Reports minor relief in R cervical pain and R hip pain following last bout of physical therapy. Thoracic pain is also reported as main concern, as well as numbness in middle three fingers on both hands. Also reports seeing a chronic pain doctor, and that she is diagnosed with fibromyalgia. Reports finding minimal relief with multiple rounds of steroid injections. Says she is trying to wean herself off of the pain medications as well.  PERTINENT HISTORY:  From recent progress note with referring provider: Ms. Nili Honda, a 64 y.o. year old female, is here today because of her Primary osteoarthritis of right hip [M16.11]. Ms. Leeth primary complain today is Neck Pain   Pertinent problems: Ms. Shambaugh has Chronic pain syndrome; Lumbar spondylosis; Chronic neck pain (Primary Area of Pain) (Bilateral) (L>R); Cervical spondylosis (Bilateral); Fibromyalgia; Chronic hip pain (Bilateral) (R>L); Chronic  Greater trochanteric bursitis (Bilateral) (L>R); Long term prescription opiate use; Opiate use; Chronic upper back pain (Secondary Area of Pain) (Bilateral) (L>R); Chronic shoulder pain (Tertiary Area of Pain) (Bilateral) (L>R); Chronic knee pain (Right); Chronic upper extremity pain (Left); Chronic lower extremity pain (Bilateral) (L>R); Osteoarthritis of hip (Bilateral); Thoracic spondylosis; Osteoarthritis of shoulder (Right); Osteoarthritis of knee (Right); Chronic knee arthropathy (Right); Chronic hip pain (Right); Myofascial pain syndrome, cervical (trapezius) (Left); Cervical facet syndrome (Bilateral) (L>R); Chronic low back pain (Bilateral) (L>R); Cervical sensory radiculopathy at C5 (Bilateral); Spondylosis without myelopathy or radiculopathy, cervical region; Cervicalgia (Bilateral) (L>R); and Chronic shoulder pain (Left) on their pertinent problem list.  PAIN:  Are you having pain? Yes: NPRS scale: < 5/10 usually Pain location: pain pathway as described above in subjective Pain description: burning, achy, stretched muscle sensation Aggravating factors: worse when she is sleeping, worse when she is on her side, bending side to side Relieving factors: Used to do ice, now taking ibuprofen  and pain medication, which doesn't help much  PRECAUTIONS: None  RED FLAGS: None   WEIGHT BEARING RESTRICTIONS: No  FALLS:  Has patient fallen in last 6 months? 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  NEXT MD VISIT: N/A, looking to get approved for an MRI appointment of the R shoulder to see if further medical intervention is  needed  OBJECTIVE:  Note: Objective measures were completed at Evaluation unless otherwise noted.  DIAGNOSTIC FINDINGS:  From Thoracic Spine MRI 08/27/23: MRI THORACIC SPINE FINDINGS   Alignment: No listhesis. In mild levocurvature of the upper thoracic spine with dextrocurvature of the lower thoracic spine and thoracolumbar junction. Straightening of the normal thoracic kyphosis.   Vertebrae: No acute fracture, evidence of discitis, or suspicious osseous lesion.  Cord:  Normal signal and morphology.   Paraspinal and other soft tissues: Negative.   Disc levels:   T1-T2: Facet arthropathy. No spinal canal stenosis. Moderate to severe right and moderate left neural foraminal narrowing, unchanged.   T2-T3: Right eccentric disc bulge. Right greater than left facet arthropathy. No spinal canal stenosis. Severe right neural foraminal narrowing, unchanged.   T3-T4: Right eccentric disc bulge. Right greater than facet arthropathy. No spinal canal stenosis. Moderate right neural foraminal narrowing.   T4-T5: Facet arthropathy. No spinal canal stenosis or neural foraminal narrowing.   T5-T6: Left paracentral disc protrusion. No spinal canal stenosis or neural foraminal narrowing.   T6-T7: Small central disc protrusion. No spinal canal stenosis or neural foraminal narrowing.   T7-T8: Small right subarticular disc protrusion. No spinal canal stenosis or neural foraminal narrowing.   T8-T9: Facet arthropathy. No spinal canal stenosis or neural foraminal narrowing.   T9-T10: Moderate facet arthropathy. No spinal canal stenosis. Mild bilateral neural foraminal narrowing.   T10-T11: Moderate to severe facet arthropathy. No spinal canal stenosis. Mild bilateral neural foraminal narrowing.   T11-T12: Moderate facet arthropathy. No spinal canal stenosis. Mild left neural foraminal narrowing.    PATIENT SURVEYS:  At next session*  COGNITION: Overall cognitive status:  Within functional limits for tasks assessed     SENSATION: Light touch: Impaired  Gross R UE decreased compared to L, numbness reported in middle three fingers of R hand occasionally    POSTURE: rounded shoulders, forward head, increased lumbar lordosis, and decreased thoracic kyphosis  PALPATION: Multiple tight mm tissue bands and trigger points found upon palpation of pt posterior trunk: B UT tightness, R rhomboid trigger points/midline of scapula, gross paraspinal mm guarding and trigger points from cervical to lumbar region, R lower back tightness,painful to the touch  UE MMT:  R shoulder flexion: 5 L shoulder flexion: 5  R shoulder abduction: 4+, sharp pain up to shoulder from arm L shoulder abduction: 5  R shoulder IR: 4+ L shoulder IR: 4, sharp pain  R shoulder ER: 4+, sharp pain L shoulder ER: 5  UE ROM: All WFL, slight discomfort but no pain   LUMBAR ROM:   AROM eval  Flexion Fingertips to mid shin, sharp pain when max range reached in center of thoracolumbar junction and on R lower lumber  Extension WFL, no pain  Right lateral flexion Fingertips to top of knee cap, sharp pain on R side  Left lateral flexion Fingertips to mid knee cap, pain on R side  Right rotation 55*, no pain  Left rotation 62*, no pain   (Blank rows = not tested)  LOWER EXTREMITY ROM:     Active  Right eval Left eval  Hip flexion    Hip extension    Hip abduction    Hip adduction    Hip internal rotation    Hip external rotation    Knee flexion    Knee extension    Ankle dorsiflexion    Ankle plantarflexion    Ankle inversion    Ankle eversion     (Blank rows = not tested)  LOWER EXTREMITY MMT:    MMT Right eval Left eval  Hip flexion    Hip extension    Hip abduction    Hip adduction    Hip internal rotation    Hip external rotation    Knee flexion    Knee extension    Ankle dorsiflexion    Ankle plantarflexion    Ankle inversion  Ankle eversion     (Blank  rows = not tested)  LUMBAR SPECIAL TESTS:  None at this session.  FUNCTIONAL TESTS:  Next session.  Quick Dash:  10/27/24  Q+B89:[id number] DASH      Please rate your ability do the following activities in the last week by selecting the number below the appropriate response.     Activities Rating  Open a tight or new jar.  3  Do heavy household chores (e.g., wash walls, floors). 2  Carry a shopping bag or briefcase 2  Wash your back. 1  Use a knife to cut food. 1  Recreational activities in which you take some force or impact through your arm, shoulder or hand (e.g., golf, hammering, tennis, etc.). 3  During the past week, to what extent has your arm, shoulder or hand problem interfered with your normal social activities with family, friends, neighbors or groups?  1  During the past week, were you limited in your work or other regular daily activities as a result of your arm, shoulder or hand problem? 1  Rate the severity of the following symptoms in the last week: Arm, Shoulder, or hand pain. 3  Rate the severity of the following symptoms in the last week: Tingling (pins and needles) in your arm, shoulder or hand. 2  During the past week, how much difficulty have you had sleeping because of the pain in your arm, shoulder or hand?  3   22  (A QuickDASH score may not be calculated if there is greater than 1 missing item.) 2   1  Quick Dash Disability/Symptom Score: [(sum of  (n) responses/ (n)] x 25 =  25     Minimally Clinically Important Difference (MCID): 15-20 points                                                                               TREATMENT DATE: 11/24/2024   Manual therapy:   PA thoracic mobs- grade 3-4 along T4-10 x several min Gentle PA ribs (Right side) around T4-T7 region- grade 2 oscillations x 30 x 3 SMT to R UT/ middle trap/lower trap and thoracic paraspinals (R side)   THEREX: -Thoraic Rot, flex, sidebending 2 x 10 reps each direction- (Added to HEP  today)  Self mob - Lacrosse ball (standing along right MT/Rhomboid region) x 3 min Seated thoracic extension at chair x 10 reps  Self care:  Added above therex to HEP and verbally reviewed: - elbows on edge of mat cat/cow  (slow and controlled)  -quadruped thoracic rotation -Importance of keeping thoracic region mobile -Ed in what thoracic facet arthropathy can look like using google images      PATIENT EDUCATION:  Education details: Educated on causation of symptoms, past MRI imaging, HEP exercise, Person educated: Patient Education method: Demonstration Education comprehension: verbalized understanding  HOME EXERCISE PROGRAM:  Access Code: [email or web address] URL: [email or web address] Date: 11/24/2024 Prepared by: Reyes London  Exercises - Prone Thoracic Flexion AROM  - 1 x daily - 3 sets - 10 reps - TL Sidebending Stretch - Arms Overhead  - 1 x daily - 3 sets - 10 reps - Seated Trunk Rotation - Arms Crossed  -  1 x daily - 3 sets - 10 reps - Seated Thoracic Flexion and Rotation with Arms Crossed  - 1 x daily - 3 sets - 10 reps    Access Code: [email or web address] URL: [email or web address] Date: 11/10/2024 Prepared by: Reyes London  Exercises - Prone Chest Stretch on Chair  - 1 x daily - 3 sets - 10 reps - Shoulder External Rotation and Scapular Retraction with Resistance  - 3 x weekly - 3 sets - 10 reps - Shoulder Internal Rotation with Resistance  - 3 x weekly - 3 sets - 10 reps - Standing Single Arm Shoulder Abduction with Resistance  - 3 x weekly - 3 sets - 10 reps - Shoulder Flexion Wall Slide with Towel  - 1 x daily - 3 sets - 10 reps      Access Code: [email or web address] URL: [email or web address] Date: 11/03/2024 Prepared by: Reyes London  Exercises - Seated Thoracic Self-Mobilization  - 1 x daily - 3 sets - 10 reps - Supine Thoracic Mobilization Towel Roll Vertical with Arm Stretch  - 1 x daily - 3 sets - 10 reps -  Quadruped Thoracic Rotation - Reach Under  - 1 x daily - 3 sets - 10 reps - Sidelying Open Book Thoracic Lumbar Rotation and Extension  - 1 x daily - 3 sets - 10 reps      Access Code: [email or web address] URL: [email or web address] Date: 10/23/2024 Prepared by: Massie Dollar  Exercises - Standing with Forearms Thoracic Rotation  - 1 x daily - 7 x weekly - 3 sets - 10 reps - 2-3 second  hold  ASSESSMENT: CLINICAL IMPRESSION: Patient is a 64 y.o. female who was seen today for physical therapy treatment for radicular pain, intervertebral disc degeneration, and spondylosis of the thoracic spine. The patient presents with persistent thoracic pain that radiates circumferentially to the right anterior chest region. Symptoms remain constant and show no meaningful change with physical therapy interventions to date. Despite successful facilitation of thoracic spine facet and rib mobility during sessions, the patient continues to report identical pain intensity and distribution before during and after treatment. The lack of response to mobility and pain management strategies suggests that the current clinical presentation may not be primarily driven by mechanical dysfunction. One additional treatment session will be attempted to assess any potential change. If symptoms remain unchanged, the patient will likely be referred back to the referring physician for further medical evaluation.  OBJECTIVE IMPAIRMENTS: decreased activity tolerance, decreased mobility, decreased ROM, decreased strength, hypomobility, increased fascial restrictions, impaired perceived functional ability, impaired flexibility, impaired sensation, impaired UE functional use, improper body mechanics, postural dysfunction, and pain.   ACTIVITY LIMITATIONS: sleeping, stairs, and bed mobility  PARTICIPATION LIMITATIONS: medication management and occupation  PERSONAL FACTORS: Age, Past/current experiences, Time since onset of  injury/illness/exacerbation, and 3+ comorbidities: chronic pain syndrome, fibromyalgia, depression, DJD are also affecting patient's functional outcome.   REHAB POTENTIAL: Fair Pt has complex medical history including chronic pain syndrome and fibromyalgia, and has not found relief for success with a few previous bouts of therapy, or medication management.  CLINICAL DECISION MAKING: Evolving/moderate complexity  EVALUATION COMPLEXITY: Moderate   GOALS: Goals reviewed with patient? No  SHORT TERM GOALS: Target date: 11/20/2024  Pt will be independent with HEP in order to decrease thoracolumbar pain in order to improve pain-free function at home.  Baseline: Goal status: INITIAL   LONG TERM GOALS: Target date: 01/15/2025   Pt will decrease worst pain as reported on NPRS by at least two  points in order to demonstrate clinically significant reduction in thoracolumbar pain. Baseline: 5-6/10 Goal status: INITIAL  2.  Pt will be able to increase active lumbar ROM with no tightness or sharp pains in order to be able to move freely and perform functional tasks for daily living. Baseline: See chart above. Goal status: INITIAL  Pt will decrease quick DASH score by at least 8% in order to demonstrate clinically significant reduction in disability. Baseline: 10/27/2024= 25  Goal status: New   PLAN:  PT FREQUENCY: 2x/week  PT DURATION: 12 weeks  PLANNED INTERVENTIONS: [id number]- PT Re-evaluation, 97750- Physical Performance Testing, 97110-Therapeutic exercises, 97530- Therapeutic activity, 97112- Neuromuscular re-education, 97535- Self Care, [id number]- Manual therapy, [phone]- Gait training, [phone]- Orthotic Initial, [phone]- Orthotic/Prosthetic subsequent, [phone]- Aquatic Therapy, [phone] (1-2 muscles), [phone] (3+ muscles)- Dry Needling, Patient/Family education, Balance training, Stair training, Joint mobilization, Joint manipulation, Spinal manipulation, Spinal mobilization, Vestibular training, DME  instructions, Wheelchair mobility training, Cryotherapy, and Moist heat.  PLAN FOR NEXT SESSION:  -Continue with progress postural activities as appropriate. -manual therapy of thoracic and R shoulder musculature -Review and expand HEP   Chyrl London, PT Physical Therapist  [place] at [facility] 11/24/2024, 4:50 PM    "

## 2024-11-26 ENCOUNTER — Ambulatory Visit

## 2024-11-27 ENCOUNTER — Other Ambulatory Visit (HOSPITAL_COMMUNITY)
Admission: RE | Admit: 2024-11-27 | Discharge: 2024-11-27 | Disposition: A | Source: Ambulatory Visit | Attending: Obstetrics | Admitting: Obstetrics

## 2024-11-27 ENCOUNTER — Ambulatory Visit: Admitting: Obstetrics

## 2024-11-27 ENCOUNTER — Encounter: Payer: Self-pay | Admitting: Obstetrics

## 2024-11-27 VITALS — BP 144/72 | HR 57 | Ht 64.0 in | Wt 157.1 lb

## 2024-11-27 DIAGNOSIS — N898 Other specified noninflammatory disorders of vagina: Secondary | ICD-10-CM | POA: Diagnosis present

## 2024-11-27 DIAGNOSIS — N950 Postmenopausal bleeding: Secondary | ICD-10-CM

## 2024-11-28 LAB — CERVICOVAGINAL ANCILLARY ONLY
Bacterial Vaginitis (gardnerella): NEGATIVE
Candida Glabrata: NEGATIVE
Candida Vaginitis: NEGATIVE
Chlamydia: NEGATIVE
Comment: NEGATIVE
Comment: NEGATIVE
Comment: NEGATIVE
Comment: NEGATIVE
Comment: NEGATIVE
Comment: NORMAL
Neisseria Gonorrhea: NEGATIVE
Trichomonas: NEGATIVE

## 2024-12-01 ENCOUNTER — Ambulatory Visit

## 2024-12-03 ENCOUNTER — Ambulatory Visit

## 2024-12-05 ENCOUNTER — Encounter: Payer: Self-pay | Admitting: Internal Medicine

## 2024-12-05 ENCOUNTER — Telehealth: Payer: Self-pay

## 2024-12-05 NOTE — Telephone Encounter (Signed)
 Pt is asking if her lung scan has been ordered or if she needs to call to schedule it.

## 2024-12-08 ENCOUNTER — Ambulatory Visit

## 2024-12-10 ENCOUNTER — Ambulatory Visit

## 2024-12-10 NOTE — Telephone Encounter (Signed)
 Scheduled pt for PM this Friday & pt informed

## 2024-12-12 ENCOUNTER — Ambulatory Visit: Admitting: Internal Medicine

## 2024-12-12 DIAGNOSIS — M5442 Lumbago with sciatica, left side: Secondary | ICD-10-CM

## 2024-12-12 DIAGNOSIS — G8929 Other chronic pain: Secondary | ICD-10-CM

## 2024-12-12 DIAGNOSIS — M5441 Lumbago with sciatica, right side: Secondary | ICD-10-CM

## 2024-12-12 MED ORDER — OXYCODONE HCL 5 MG PO TABS: 5.0000 mg | ORAL_TABLET | ORAL | 0 refills | Status: AC

## 2024-12-12 NOTE — Progress Notes (Signed)
 "  Established Patient Office Visit  Subjective:  Patient ID: Nicole Walter, female    DOB: Mar 25, 1961  Age: 64 y.o. MRN: [id number]  Chief Complaint  Patient presents with   Pain Management    PM    Here for pain management follow up. Chronic pain well controlled on current analgesia. Was unable to completely wean off oxycodone  and ended up on every other day dosing. Last drug screen satisfactory and pill counts have also been satisfactory.     No other concerns at this time.   Past Medical History:  Diagnosis Date   Abnormal liver enzymes 02/08/2016   Allergy    Anxiety    Chest pain 07/05/2016   Chronic pain    Depression    DJD (degenerative joint disease)    lumbar   Fibromyalgia    High cholesterol    Rash of back 01/28/2016   Weight loss due to medication     Past Surgical History:  Procedure Laterality Date   ANKLE SURGERY     x 2   BREAST BIOPSY Left 1997   CHOLECYSTECTOMY     COLONOSCOPY     COLONOSCOPY WITH PROPOFOL  N/A 02/22/2016   Procedure: COLONOSCOPY WITH PROPOFOL ;  Surgeon: Gladis RAYMOND Mariner, MD;  Location: [facility] ENDOSCOPY;  Service: Endoscopy;  Laterality: N/A;   ESOPHAGOGASTRODUODENOSCOPY (EGD) WITH PROPOFOL  N/A 02/22/2016   Procedure: ESOPHAGOGASTRODUODENOSCOPY (EGD) WITH PROPOFOL ;  Surgeon: Gladis RAYMOND Mariner, MD;  Location: [facility] ENDOSCOPY;  Service: Endoscopy;  Laterality: N/A;   NASAL SINUS SURGERY     SHOULDER ARTHROSCOPY WITH ROTATOR CUFF REPAIR AND SUBACROMIAL DECOMPRESSION Left 09/23/2019   Procedure: SHOULDER ARTHROSCOPY WITH MINI ROTATOR CUFF REPAIR AND SUBACROMIAL DECOMPRESSION, BICEPS TENODESIS;  Surgeon: Tobie Priest, MD;  Location: [facility];  Service: Orthopedics;  Laterality: Left;   TONSILLECTOMY     TUBAL LIGATION      Social History   Socioeconomic History   Marital status: Legally Separated    Spouse name: Not on file   Number of children: Not on file   Years of education: Not on file   Highest education level: Not on  file  Occupational History   Not on file  Tobacco Use   Smoking status: Former    Current packs/day: 0.00    Average packs/day: 1 pack/day for 41.3 years (41.3 ttl pk-yrs)    Types: E-cigarettes, Cigarettes    Start date: 03/06/1974    Quit date: 07/08/2015    Years since quitting: 9.4   Smokeless tobacco: Never   Tobacco comments:    vapor cigarettes, no nicotene  Vaping Use   Vaping status: Every Day   Start date: 07/08/2015   Substances: Nicotine , Flavoring   Devices: [employer]  Substance and Sexual Activity   Alcohol use: No    Alcohol/week: 0.0 standard drinks of alcohol   Drug use: No   Sexual activity: Not Currently    Birth control/protection: Post-menopausal  Other Topics Concern   Not on file  Social History Narrative   Not on file   Social Drivers of Health   Tobacco Use: Medium Risk (11/27/2024)   Patient History    Smoking Tobacco Use: Former    Smokeless Tobacco Use: Never    Passive Exposure: Not on file  Financial Resource Strain: Medium Risk (06/03/2024)   Received from [facility]   Overall Financial Resource Strain (CARDIA)    Difficulty of Paying Living Expenses: Somewhat hard  Food Insecurity: Food Insecurity Present (06/03/2024)  Received from [facility]   Epic    Within the past 12 months, you worried that your food would run out before you got the money to buy more.: Often true    Within the past 12 months, the food you bought just didn't last and you didn't have money to get more.: Often true  Transportation Needs: No Transportation Needs (06/03/2024)   Received from [facility] - Transportation    In the past 12 months, has lack of transportation kept you from medical appointments or from getting medications?: No    Lack of Transportation (Non-Medical): No  Physical Activity: Not on file  Stress: Not on file  Social Connections: Not on file  Intimate Partner Violence: Not on file   Depression (PHQ2-9): Low Risk (09/30/2024)   Depression (PHQ2-9)    PHQ-2 Score: 0  Alcohol Screen: Not on file  Housing: Low Risk  (06/03/2024)   Received from [facility]   Epic    In the last 12 months, was there a time when you were not able to pay the mortgage or rent on time?: No    In the past 12 months, how many times have you moved where you were living?: 1    At any time in the past 12 months, were you homeless or living in a shelter (including now)?: No  Utilities: Not At Risk (06/03/2024)   Received from [facility]   Epic    In the past 12 months has the electric, gas, oil, or water company threatened to shut off services in your home?: No  Health Literacy: Low Risk (06/12/2023)   Received from [facility]   Health Literacy    : Never    Family History  Problem Relation Age of Onset   Heart disease Mother    Stroke Mother    Cancer Father        lung   Arthritis Father    Arthritis Paternal Grandmother    Arthritis Paternal Grandfather    Drug abuse Sister    Drug abuse Brother    Post-traumatic stress disorder Brother    Diabetes Sister    Dementia Sister    Drug abuse Brother    Cancer Brother     Allergies[1]  Show/hide medication list[2]  Review of Systems  Constitutional:  Positive for weight loss (2 lbs).  HENT: Negative.    Eyes: Negative.   Cardiovascular: Negative.   Gastrointestinal: Negative.   Genitourinary: Negative.        As in hpi  Musculoskeletal:  Positive for back pain, joint pain and neck pain.       As in hpi  Skin: Negative.   Neurological: Negative.   Endo/Heme/Allergies: Negative.   Psychiatric/Behavioral:  The patient is nervous/anxious.        Objective:   BP 122/72   Pulse 77   Temp 98.1 F (36.7 C)   Ht 5' 4 (1.626 m)   Wt 155 lb 9.6 oz (70.6 kg)   SpO2 97%   BMI 26.71 kg/m   Vitals:   12/12/24 1126  BP: 122/72  Pulse: 77  Temp: 98.1 F (36.7 C)  Height: 5' 4  (1.626 m)  Weight: 155 lb 9.6 oz (70.6 kg)  SpO2: 97%  BMI (Calculated): 26.7    Physical Exam Vitals reviewed.  Constitutional:      General: She is not in acute distress. HENT:  Head: Normocephalic.     Nose: Nose normal.     Mouth/Throat:     Mouth: Mucous membranes are moist.  Eyes:     Extraocular Movements: Extraocular movements intact.     Pupils: Pupils are equal, round, and reactive to light.  Cardiovascular:     Rate and Rhythm: Normal rate and regular rhythm.     Heart sounds: No murmur heard. Pulmonary:     Effort: Pulmonary effort is normal.     Breath sounds: No rhonchi or rales.  Abdominal:     General: Abdomen is flat.     Palpations: There is no hepatomegaly, splenomegaly or mass.     Tenderness: There is abdominal tenderness in the right lower quadrant and left lower quadrant. There is no guarding or rebound.  Musculoskeletal:        General: Normal range of motion.     Cervical back: Normal range of motion. No tenderness.  Skin:    General: Skin is warm and dry.  Neurological:     General: No focal deficit present.     Mental Status: She is alert and oriented to person, place, and time.     Cranial Nerves: No cranial nerve deficit.     Motor: No weakness.  Psychiatric:        Mood and Affect: Mood normal.        Behavior: Behavior normal.      No results found for any visits on 12/12/24.  Recent Results (from the past 2160 hours)  Hemoglobin A1c     Status: None   Collection Time: 09/25/24  9:38 AM  Result Value Ref Range   Hgb A1c MFr Bld 5.4 4.8 - 5.6 %    Comment:          Prediabetes: 5.7 - 6.4          Diabetes: >6.4          Glycemic control for adults with diabetes: <7.0    Est. average glucose Bld gHb Est-mCnc 108 mg/dL  TSH     Status: None   Collection Time: 09/25/24  9:38 AM  Result Value Ref Range   TSH 0.880 0.450 - 4.500 uIU/mL  CMP14+EGFR     Status: None   Collection Time: 09/25/24  9:38 AM  Result Value Ref Range    Glucose 90 70 - 99 mg/dL   BUN 18 8 - 27 mg/dL   Creatinine, Ser 9.04 0.57 - 1.00 mg/dL   eGFR 68 >40 fO/fpw/8.26   BUN/Creatinine Ratio 19 12 - 28   Sodium 143 134 - 144 mmol/L   Potassium 4.7 3.5 - 5.2 mmol/L   Chloride 104 96 - 106 mmol/L   CO2 23 20 - 29 mmol/L   Calcium 9.3 8.7 - 10.3 mg/dL   Total Protein 6.1 6.0 - 8.5 g/dL   Albumin 4.2 3.9 - 4.9 g/dL   Globulin, Total 1.9 1.5 - 4.5 g/dL   Bilirubin Total 0.4 0.0 - 1.2 mg/dL   Alkaline Phosphatase 85 49 - 135 IU/L   AST 15 0 - 40 IU/L   ALT 14 0 - 32 IU/L  Lipid panel     Status: None   Collection Time: 09/25/24  9:38 AM  Result Value Ref Range   Cholesterol, Total 182 100 - 199 mg/dL   Triglycerides 60 0 - 149 mg/dL   HDL 75 >60 mg/dL   VLDL Cholesterol Cal 11 5 - 40 mg/dL   LDL Chol Calc (  NIH) 96 0 - 99 mg/dL   Chol/HDL Ratio 2.4 0.0 - 4.4 ratio    Comment:                                   T. Chol/HDL Ratio                                             Men  Women                               1/2 Avg.Risk  3.4    3.3                                   Avg.Risk  5.0    4.4                                2X Avg.Risk  9.6    7.1                                3X Avg.Risk 23.4   11.0   Drug Screen 13 with reflex Confirmation (AMP,BAR,BZO,COC,PCP,THC,OPI,OXY,MD,FEN,MEP,PPX,TRAM), Serum     Status: Abnormal   Collection Time: 11/03/24  2:29 PM  Result Value Ref Range   Amphetamines, IA Negative Cutoff:50 ng/mL   Barbiturates, IA Negative Cutoff:0.1 ug/mL   Benzodiazepines, IA Negative Cutoff:20 ng/mL   Cocaine & Metabolite, IA Negative Cutoff:25 ng/mL   Phencyclidine, IA Negative Cutoff:8 ng/mL   THC(Marijuana) Metabolite, IA Negative Cutoff:5 ng/mL   Opiates, IA Negative Cutoff:5 ng/mL   Oxycodones, IA ++POSITIVE++ (A) Cutoff:5 ng/mL   Methadone, IA Negative Cutoff:25 ng/mL   FENTANYL , IA Negative Cutoff:1.0 ng/mL   Propoxyphene, IA Negative Cutoff:50 ng/mL   MEPERIDINE, IA Negative Cutoff:100 ng/mL   TRAMADOL , IA  Negative Cutoff:50 ng/mL    Comment: This test was developed and its performance characteristics determined by [facility].  It has not been cleared or approved by the Food and Drug Administration.   Oxycodones,MS,WB/Sp Rfx     Status: None   Collection Time: 11/03/24  2:29 PM  Result Value Ref Range   Oxycodones Confirmation Positive    Oxycocone 5.9 ng/mL   Oxymorphone Negative ng/mL    Comment: Expected metabolism of oxycodone  class drugs:   Parent Drug       Detected Metabolites  -----------       --------------------  Oxycodone :        Oxymorphone  Oxymorphone:      None Confirmation threshold: 1.0 ng/mL   POCT Urinalysis Dipstick     Status: Abnormal   Collection Time: 11/17/24 11:47 AM  Result Value Ref Range   Color, UA     Clarity, UA     Glucose, UA Negative Negative   Bilirubin, UA Negative    Ketones, UA Negative    Spec Grav, UA 1.015 1.010 - 1.025   Blood, UA Negative    pH, UA 6.5 5.0 - 8.0   Protein, UA Negative Negative   Urobilinogen, UA 0.2 0.2 or 1.0 E.U./dL   Nitrite, UA Negative  Leukocytes, UA Large (3+) (A) Negative   Appearance     Odor    Cervicovaginal ancillary only     Status: None   Collection Time: 11/27/24  9:16 AM  Result Value Ref Range   Neisseria Gonorrhea Negative    Chlamydia Negative    Trichomonas Negative    Bacterial Vaginitis (gardnerella) Negative    Candida Vaginitis Negative    Candida Glabrata Negative    Comment      Normal Reference Range Bacterial Vaginosis - Negative   Comment Normal Reference Range Candida Species - Negative    Comment Normal Reference Range Candida Galbrata - Negative    Comment Normal Reference Range Trichomonas - Negative    Comment Normal Reference Ranger Chlamydia - Negative    Comment      Normal Reference Range Neisseria Gonorrhea - Negative      Assessment & Plan:  Tennille was seen today for pain management.  Chronic low back pain (Bilateral) (L>R)    Problem List Items  Addressed This Visit       Nervous and Auditory   Chronic low back pain (Bilateral) (L>R) (Chronic)    Return in about 2 months (around 02/09/2025).   Total time spent: 20 minutes. This time includes review of previous notes and results and patient face to face interaction during today'Iyad Deroo visit.    Sherrill Cinderella Perry, MD  12/12/2024   This document may have been prepared by [facility] Voice Recognition software and as such may include unintentional dictation errors.     [1]  Allergies Allergen Reactions   Other Hives and Other (See Comments)    Pt states she was tested for allergies and peas was one that she is allergic to but has never ate enough of them to see a reaction. Pt states when she is around bird feathers she gets the hives FEATHERS   Adhesive [Tape] Hives   Food     PEAS/ patient does not know what type reaction she has/ from allergy test during childhood   Statins Other (See Comments)    Severe myalgia   Pea Other (See Comments)    Other reaction(Meng Winter[place]): Unknown.  Pt states she was tested for allergies and peas was one that she is allergic to but has never ate enough of them to see a reaction.  [2]  Outpatient Medications Prior to Visit  Medication Sig   Azelastine  HCl 137 MCG/SPRAY SOLN Place 1 Inhalation into the nose daily.   Calcium Carb-Cholecalciferol (CALCIUM 600 + D) 600-5 MG-MCG TABS Take by mouth.   cetirizine  (ZYRTEC ) 10 MG tablet Take 1 tablet (10 mg total) by mouth every morning.   ezetimibe  (ZETIA ) 10 MG tablet Take 1 tablet (10 mg total) by mouth daily.   fenofibrate  160 MG tablet Take 1 tablet (160 mg total) by mouth daily.   ibuprofen  (ADVIL ) 800 MG tablet Take 1 tablet (800 mg total) by mouth every 8 (eight) hours as needed.   LINZESS  72 MCG capsule TK 1 C PO QD   LORazepam  (ATIVAN ) 1 MG tablet Take 1 tablet (1 mg total) by mouth every 8 (eight) hours as needed. for anxiety   Multiple Vitamin (MULTI-VITAMINS) TABS Take 1 tablet by mouth daily.    mupirocin ointment (BACTROBAN) 2 % Apply to affected area of the skin up to three times per day as needed.   naloxone  (NARCAN ) nasal spray 4 mg/0.1 mL 1 spray in nostril if very drowsy after taking pain medicine   omeprazole (  PRILOSEC) 40 MG capsule Take 40 mg by mouth daily.   ondansetron  (ZOFRAN -ODT) 4 MG disintegrating tablet Take 1 tablet (4 mg total) by mouth every 8 (eight) hours as needed for nausea or vomiting.   nicotine  (NICODERM CQ  - DOSED IN MG/24 HOURS) 14 mg/24hr patch Place 1 patch (14 mg total) onto the skin daily. (Patient not taking: Reported on 11/11/2024)   Vilazodone HCl (VIIBRYD) 10 MG TABS Take 10 mg by mouth daily.   No facility-administered medications prior to visit.   "

## 2024-12-15 ENCOUNTER — Ambulatory Visit: Attending: Student in an Organized Health Care Education/Training Program

## 2024-12-17 ENCOUNTER — Ambulatory Visit

## 2024-12-22 ENCOUNTER — Ambulatory Visit

## 2024-12-24 ENCOUNTER — Ambulatory Visit

## 2024-12-29 ENCOUNTER — Ambulatory Visit

## 2024-12-31 ENCOUNTER — Ambulatory Visit

## 2025-01-05 ENCOUNTER — Ambulatory Visit: Attending: Student in an Organized Health Care Education/Training Program

## 2025-01-06 ENCOUNTER — Ambulatory Visit: Admitting: Internal Medicine

## 2025-01-07 ENCOUNTER — Ambulatory Visit

## 2025-01-12 ENCOUNTER — Ambulatory Visit
